# Patient Record
Sex: Male | Born: 1965
Health system: Southern US, Community
[De-identification: ages and names within clinical notes are randomized; demographics above are authoritative.]

## PROBLEM LIST (undated history)

## (undated) ENCOUNTER — Emergency Department (HOSPITAL_COMMUNITY): Discharge status: Absent without leave | Payer: BC Managed Care – PPO

## (undated) DIAGNOSIS — N179 Acute kidney failure, unspecified: Secondary | ICD-10-CM

## (undated) DIAGNOSIS — R0600 Dyspnea, unspecified: Secondary | ICD-10-CM

## (undated) DIAGNOSIS — E119 Type 2 diabetes mellitus without complications: Secondary | ICD-10-CM

## (undated) DIAGNOSIS — E7800 Pure hypercholesterolemia, unspecified: Secondary | ICD-10-CM

## (undated) DIAGNOSIS — R519 Headache, unspecified: Secondary | ICD-10-CM

## (undated) DIAGNOSIS — N189 Chronic kidney disease, unspecified: Secondary | ICD-10-CM

## (undated) DIAGNOSIS — I10 Essential (primary) hypertension: Secondary | ICD-10-CM

## (undated) DIAGNOSIS — G510 Bell's palsy: Secondary | ICD-10-CM

## (undated) DIAGNOSIS — L0291 Cutaneous abscess, unspecified: Secondary | ICD-10-CM

## (undated) DIAGNOSIS — M25512 Pain in left shoulder: Secondary | ICD-10-CM

## (undated) DIAGNOSIS — N186 End stage renal disease: Secondary | ICD-10-CM

## (undated) DIAGNOSIS — R569 Unspecified convulsions: Secondary | ICD-10-CM

## (undated) DIAGNOSIS — N289 Disorder of kidney and ureter, unspecified: Secondary | ICD-10-CM

## (undated) HISTORY — DX: Pain in left shoulder: M25.512

## (undated) HISTORY — PX: OTHER SURGICAL HISTORY: SHX169

## (undated) HISTORY — DX: Disorder of kidney and ureter, unspecified: N28.9

## (undated) HISTORY — DX: Bell's palsy: G51.0

## (undated) HISTORY — PX: CHOLECYSTECTOMY: SHX55

---

## 2006-05-09 ENCOUNTER — Emergency Department (HOSPITAL_COMMUNITY): Admission: EM | Admit: 2006-05-09 | Discharge: 2006-05-09 | Payer: Self-pay | Admitting: Emergency Medicine

## 2010-07-12 ENCOUNTER — Ambulatory Visit
Admission: RE | Admit: 2010-07-12 | Discharge: 2010-07-12 | Payer: Self-pay | Source: Home / Self Care | Attending: Internal Medicine | Admitting: Internal Medicine

## 2011-11-11 ENCOUNTER — Encounter (HOSPITAL_COMMUNITY): Payer: Self-pay | Admitting: Certified Registered Nurse Anesthetist

## 2011-11-11 ENCOUNTER — Encounter (HOSPITAL_COMMUNITY)
Admission: EM | Disposition: A | Discharge status: Home Health | Payer: Self-pay | Source: Ambulatory Visit | Attending: Family Medicine

## 2011-11-11 ENCOUNTER — Inpatient Hospital Stay (HOSPITAL_COMMUNITY): Payer: BC Managed Care – PPO | Admitting: Certified Registered Nurse Anesthetist

## 2011-11-11 ENCOUNTER — Inpatient Hospital Stay (HOSPITAL_COMMUNITY)
Admission: EM | Admit: 2011-11-11 | Discharge: 2011-11-13 | DRG: 277 | Disposition: A | Discharge status: Home Health | Payer: BC Managed Care – PPO | Source: Ambulatory Visit | Attending: Internal Medicine | Admitting: Internal Medicine

## 2011-11-11 ENCOUNTER — Encounter (HOSPITAL_COMMUNITY): Payer: Self-pay

## 2011-11-11 ENCOUNTER — Emergency Department (HOSPITAL_COMMUNITY): Payer: BC Managed Care – PPO

## 2011-11-11 DIAGNOSIS — L03115 Cellulitis of right lower limb: Secondary | ICD-10-CM

## 2011-11-11 DIAGNOSIS — E1165 Type 2 diabetes mellitus with hyperglycemia: Secondary | ICD-10-CM

## 2011-11-11 DIAGNOSIS — R739 Hyperglycemia, unspecified: Secondary | ICD-10-CM

## 2011-11-11 DIAGNOSIS — L03119 Cellulitis of unspecified part of limb: Secondary | ICD-10-CM

## 2011-11-11 DIAGNOSIS — L02419 Cutaneous abscess of limb, unspecified: Principal | ICD-10-CM | POA: Diagnosis present

## 2011-11-11 DIAGNOSIS — I10 Essential (primary) hypertension: Secondary | ICD-10-CM | POA: Diagnosis present

## 2011-11-11 DIAGNOSIS — E119 Type 2 diabetes mellitus without complications: Secondary | ICD-10-CM

## 2011-11-11 DIAGNOSIS — IMO0001 Reserved for inherently not codable concepts without codable children: Secondary | ICD-10-CM | POA: Diagnosis present

## 2011-11-11 DIAGNOSIS — E782 Mixed hyperlipidemia: Secondary | ICD-10-CM | POA: Diagnosis present

## 2011-11-11 DIAGNOSIS — E1169 Type 2 diabetes mellitus with other specified complication: Secondary | ICD-10-CM | POA: Diagnosis present

## 2011-11-11 HISTORY — DX: Essential (primary) hypertension: I10

## 2011-11-11 HISTORY — DX: Cutaneous abscess, unspecified: L02.91

## 2011-11-11 HISTORY — PX: I & D EXTREMITY: SHX5045

## 2011-11-11 LAB — GLUCOSE, CAPILLARY: Glucose-Capillary: 167 mg/dL — ABNORMAL HIGH (ref 70–99)

## 2011-11-11 LAB — BASIC METABOLIC PANEL
BUN: 14 mg/dL (ref 6–23)
Chloride: 92 mEq/L — ABNORMAL LOW (ref 96–112)
Glucose, Bld: 427 mg/dL — ABNORMAL HIGH (ref 70–99)
Potassium: 4.4 mEq/L (ref 3.5–5.1)

## 2011-11-11 LAB — CBC
MCHC: 35.2 g/dL (ref 30.0–36.0)
RBC: 5.01 MIL/uL (ref 4.22–5.81)
WBC: 11.7 10*3/uL — ABNORMAL HIGH (ref 4.0–10.5)

## 2011-11-11 LAB — DIFFERENTIAL
Basophils Absolute: 0 10*3/uL (ref 0.0–0.1)
Basophils Relative: 0 % (ref 0–1)
Eosinophils Absolute: 0.1 10*3/uL (ref 0.0–0.7)
Eosinophils Relative: 1 % (ref 0–5)
Neutro Abs: 8.4 10*3/uL — ABNORMAL HIGH (ref 1.7–7.7)

## 2011-11-11 LAB — POCT I-STAT 4, (NA,K, GLUC, HGB,HCT): Glucose, Bld: 185 mg/dL — ABNORMAL HIGH (ref 70–99)

## 2011-11-11 SURGERY — IRRIGATION AND DEBRIDEMENT EXTREMITY
Anesthesia: General | Site: Perineum | Laterality: Right | Wound class: Contaminated

## 2011-11-11 MED ORDER — FENTANYL CITRATE 0.05 MG/ML IJ SOLN
INTRAMUSCULAR | Status: DC | PRN
  Administered 2011-11-11: 100 ug via INTRAVENOUS
  Administered 2011-11-11: 150 ug via INTRAVENOUS

## 2011-11-11 MED ORDER — MORPHINE SULFATE 2 MG/ML IJ SOLN: 0.0500 mg/kg | INTRAMUSCULAR | Status: DC | PRN

## 2011-11-11 MED ORDER — ONDANSETRON HCL 4 MG/2ML IJ SOLN: 4.0000 mg | Freq: Three times a day (TID) | INTRAMUSCULAR | Status: DC | PRN

## 2011-11-11 MED ORDER — SODIUM CHLORIDE 0.9 % IV SOLN
INTRAVENOUS | Status: DC | PRN
  Administered 2011-11-11: 20:00:00 via INTRAVENOUS

## 2011-11-11 MED ORDER — DIPHENHYDRAMINE HCL 12.5 MG/5ML PO ELIX: 12.5000 mg | ORAL_SOLUTION | Freq: Four times a day (QID) | ORAL | Status: DC | PRN

## 2011-11-11 MED ORDER — PROPOFOL 10 MG/ML IV EMUL
INTRAVENOUS | Status: DC | PRN
  Administered 2011-11-11: 200 mg via INTRAVENOUS

## 2011-11-11 MED ORDER — GLYCOPYRROLATE 0.2 MG/ML IJ SOLN
INTRAMUSCULAR | Status: DC | PRN
  Administered 2011-11-11: .5 mg via INTRAVENOUS

## 2011-11-11 MED ORDER — SODIUM CHLORIDE 0.9 % IJ SOLN: 9.0000 mL | INTRAMUSCULAR | Status: DC | PRN

## 2011-11-11 MED ORDER — ONDANSETRON HCL 4 MG/2ML IJ SOLN
INTRAMUSCULAR | Status: DC | PRN
  Administered 2011-11-11: 4 mg via INTRAVENOUS

## 2011-11-11 MED ORDER — PIPERACILLIN-TAZOBACTAM 3.375 G IVPB
3.3750 g | Freq: Once | INTRAVENOUS | Status: AC
  Administered 2011-11-11: 3.375 g via INTRAVENOUS
  Filled 2011-11-11: qty 50

## 2011-11-11 MED ORDER — IOHEXOL 300 MG/ML  SOLN
100.0000 mL | Freq: Once | INTRAMUSCULAR | Status: AC | PRN
  Administered 2011-11-11: 100 mL via INTRAVENOUS

## 2011-11-11 MED ORDER — NALOXONE HCL 0.4 MG/ML IJ SOLN: 0.4000 mg | INTRAMUSCULAR | Status: DC | PRN

## 2011-11-11 MED ORDER — VANCOMYCIN HCL IN DEXTROSE 1-5 GM/200ML-% IV SOLN
1000.0000 mg | Freq: Once | INTRAVENOUS | Status: AC
  Administered 2011-11-11: 1000 mg via INTRAVENOUS
  Filled 2011-11-11: qty 200

## 2011-11-11 MED ORDER — HYDROMORPHONE HCL PF 1 MG/ML IJ SOLN
0.2500 mg | INTRAMUSCULAR | Status: DC | PRN
  Administered 2011-11-11 (×4): 0.5 mg via INTRAVENOUS

## 2011-11-11 MED ORDER — MIDAZOLAM HCL 5 MG/5ML IJ SOLN
INTRAMUSCULAR | Status: DC | PRN
  Administered 2011-11-11: 2 mg via INTRAVENOUS

## 2011-11-11 MED ORDER — SODIUM CHLORIDE 0.9 % IV SOLN
Freq: Once | INTRAVENOUS | Status: AC
  Administered 2011-11-11: 11:00:00 via INTRAVENOUS

## 2011-11-11 MED ORDER — HYDROMORPHONE HCL PF 1 MG/ML IJ SOLN
INTRAMUSCULAR | Status: AC
  Filled 2011-11-11: qty 1

## 2011-11-11 MED ORDER — ROCURONIUM BROMIDE 100 MG/10ML IV SOLN
INTRAVENOUS | Status: DC | PRN
  Administered 2011-11-11: 30 mg via INTRAVENOUS

## 2011-11-11 MED ORDER — SODIUM CHLORIDE 0.9 % IV SOLN: INTRAVENOUS | Status: DC

## 2011-11-11 MED ORDER — SUCCINYLCHOLINE CHLORIDE 20 MG/ML IJ SOLN
INTRAMUSCULAR | Status: DC | PRN
  Administered 2011-11-11: 140 mg via INTRAVENOUS

## 2011-11-11 MED ORDER — HYDROMORPHONE HCL PF 1 MG/ML IJ SOLN: 1.0000 mg | INTRAMUSCULAR | Status: DC | PRN

## 2011-11-11 MED ORDER — ONDANSETRON HCL 4 MG/2ML IJ SOLN: 4.0000 mg | Freq: Once | INTRAMUSCULAR | Status: DC | PRN

## 2011-11-11 MED ORDER — DIPHENHYDRAMINE HCL 50 MG/ML IJ SOLN: 12.5000 mg | Freq: Four times a day (QID) | INTRAMUSCULAR | Status: DC | PRN

## 2011-11-11 MED ORDER — LACTATED RINGERS IV SOLN
INTRAVENOUS | Status: DC | PRN
  Administered 2011-11-11: 19:00:00 via INTRAVENOUS

## 2011-11-11 MED ORDER — MORPHINE SULFATE (PF) 1 MG/ML IV SOLN: INTRAVENOUS | Status: DC

## 2011-11-11 MED ORDER — ONDANSETRON HCL 4 MG/2ML IJ SOLN: 4.0000 mg | Freq: Four times a day (QID) | INTRAMUSCULAR | Status: DC | PRN

## 2011-11-11 MED ORDER — NEOSTIGMINE METHYLSULFATE 1 MG/ML IJ SOLN
INTRAMUSCULAR | Status: DC | PRN
  Administered 2011-11-11: 4 mg via INTRAVENOUS

## 2011-11-11 SURGICAL SUPPLY — 32 items
BANDAGE GAUZE ELAST BULKY 4 IN (GAUZE/BANDAGES/DRESSINGS) ×2 IMPLANT
CANISTER SUCTION 2500CC (MISCELLANEOUS) ×2 IMPLANT
CHLORAPREP W/TINT 26ML (MISCELLANEOUS) IMPLANT
CLOTH BEACON ORANGE TIMEOUT ST (SAFETY) ×2 IMPLANT
COVER SURGICAL LIGHT HANDLE (MISCELLANEOUS) ×2 IMPLANT
DRAPE EXTREMITY T 121X128X90 (DRAPE) ×2 IMPLANT
DRAPE PROXIMA HALF (DRAPES) ×4 IMPLANT
DRAPE UTILITY 15X26 W/TAPE STR (DRAPE) ×8 IMPLANT
DRSG PAD ABDOMINAL 8X10 ST (GAUZE/BANDAGES/DRESSINGS) ×2 IMPLANT
ELECT REM PT RETURN 9FT ADLT (ELECTROSURGICAL) ×2
ELECTRODE REM PT RTRN 9FT ADLT (ELECTROSURGICAL) ×1 IMPLANT
GLOVE BIOGEL PI IND STRL 8 (GLOVE) ×1 IMPLANT
GLOVE BIOGEL PI INDICATOR 8 (GLOVE) ×1
GLOVE ECLIPSE 8.0 STRL XLNG CF (GLOVE) ×2 IMPLANT
GOWN STRL NON-REIN LRG LVL3 (GOWN DISPOSABLE) ×4 IMPLANT
HANDPIECE INTERPULSE COAX TIP (DISPOSABLE)
KIT BASIN OR (CUSTOM PROCEDURE TRAY) ×2 IMPLANT
KIT ROOM TURNOVER OR (KITS) ×2 IMPLANT
NS IRRIG 1000ML POUR BTL (IV SOLUTION) ×2 IMPLANT
PACK GENERAL/GYN (CUSTOM PROCEDURE TRAY) ×2 IMPLANT
PAD ARMBOARD 7.5X6 YLW CONV (MISCELLANEOUS) ×4 IMPLANT
SET HNDPC FAN SPRY TIP SCT (DISPOSABLE) IMPLANT
SPECIMEN JAR SMALL (MISCELLANEOUS) ×2 IMPLANT
SPONGE GAUZE 4X4 12PLY (GAUZE/BANDAGES/DRESSINGS) ×2 IMPLANT
STOCKINETTE 6  STRL (DRAPES) ×1
STOCKINETTE 6 STRL (DRAPES) ×1 IMPLANT
SUT VIC AB 3-0 SH 18 (SUTURE) IMPLANT
TOWEL OR 17X24 6PK STRL BLUE (TOWEL DISPOSABLE) ×2 IMPLANT
TOWEL OR 17X26 10 PK STRL BLUE (TOWEL DISPOSABLE) ×2 IMPLANT
TUBE ANAEROBIC SPECIMEN COL (MISCELLANEOUS) IMPLANT
UNDERPAD 30X30 INCONTINENT (UNDERPADS AND DIAPERS) ×2 IMPLANT
WATER STERILE IRR 1000ML POUR (IV SOLUTION) IMPLANT

## 2011-11-11 NOTE — ED Provider Notes (Signed)
History     CSN: [id number]  Arrival date & time 11/11/11  [id number]   First MD Initiated Contact with Patient 11/11/11 [id number]      Chief Complaint  Patient presents with  . Abscess    (Consider location/radiation/quality/duration/timing/severity/associated sxs/prior treatment) HPI History from patient. 46 year old diabetic male who presents with abscess to his right posterior upper thigh/buttock; presents today 2/2 worsening pain. States this first came up approximately a week ago. It has been gradually worsening over the past several days. States the area ruptured and has had copious drainage, but he still has considerable pain from the area. Pain worsens with sitting. He denies fever or chills although notes he has had some sweating. Denies scrotal or penile lesions or swelling. States it is difficult to have a bowel movement due to the pain. He apparently has had these in the past and they have been incised and drained in the emergency department.  Patient states that he was formerly on medication for his diabetes, but has not seen a physician in about 5 years for this. He is not sure what his daily blood sugars have been running. He denies increased frequency of urination or thirst.  Past Medical History  Diagnosis Date  . Abscess   . Hypertension   . Diabetes mellitus     History reviewed. No pertinent past surgical history.  No family history on file.  History  Substance Use Topics  . Smoking status: Never Smoker   . Smokeless tobacco: Not on file  . Alcohol Use: No      Review of Systems  Constitutional: Negative for fever, chills, activity change and appetite change.  Gastrointestinal: Negative for nausea, vomiting, abdominal pain, diarrhea, constipation, blood in stool and anal bleeding.  Genitourinary: Negative for flank pain, discharge, scrotal swelling, difficulty urinating, penile pain and testicular pain.  Skin: Positive for wound. Negative for color change.     Allergies  Review of patient's allergies indicates no known allergies.  Home Medications  No current outpatient prescriptions on file.  BP 167/109  Pulse 113  Temp(Src) 98.2 F (36.8 C) (Oral)  Resp 16  SpO2 100%  Physical Exam  Nursing note and vitals reviewed. Constitutional: He appears well-developed and well-nourished. No distress.       Afebrile  HENT:  Head: Normocephalic and atraumatic.  Neck: Normal range of motion.  Cardiovascular: Regular rhythm and normal heart sounds.        Tachycardic  Pulmonary/Chest: Effort normal and breath sounds normal. He exhibits no tenderness.  Abdominal: Soft. Bowel sounds are normal. There is no tenderness. There is no rebound and no guarding.  Genitourinary:       Wound measuring approximately 5 x 3 cm to right buttock/upper thigh area. Surrounding extensive induration. No overlying cellulitis appreciated. There is no involvement at the midline. No scrotal or penile swelling or discharge. Area is extremely tender to palpation.  Neurological: He is alert.  Skin: He is not diaphoretic.    ED Course  Procedures (including critical care time)  Labs Reviewed  GLUCOSE, CAPILLARY - Abnormal; Notable for the following:    Glucose-Capillary 397 (*)    All other components within normal limits  CBC - Abnormal; Notable for the following:    WBC 11.7 (*)    HCT 37.5 (*)    MCV 74.9 (*)    All other components within normal limits  DIFFERENTIAL - Abnormal; Notable for the following:    Monocytes Relative 15 (*)  Neutro Abs 8.4 (*)    Monocytes Absolute 1.8 (*)    All other components within normal limits  BASIC METABOLIC PANEL - Abnormal; Notable for the following:    Sodium 131 (*)    Chloride 92 (*)    Glucose, Bld 427 (*)    GFR calc non Af Amer 89 (*)    All other components within normal limits  URINALYSIS, ROUTINE W REFLEX MICROSCOPIC  CULTURE, BLOOD (ROUTINE X 2)  CULTURE, BLOOD (ROUTINE X 2)   Ct Pelvis W  Contrast  11/11/2011  *RADIOLOGY REPORT*  Clinical Data:  Peritesticular abscesses.  CT PELVIS WITH CONTRAST  Technique:  Multidetector CT imaging of the pelvis was performed using the standard protocol following the bolus administration of intravenous contrast.  Contrast: 13mL OMNIPAQUE IOHEXOL 300 MG/ML  SOLN  Comparison:  None.  Findings:  Small periumbilical hernia contains fat.  Visualized portion of the bowel is unremarkable.  No pathologically enlarged lymph nodes.  No free fluid.  Scattered atherosclerotic calcification of the arterial vasculature.  There is skin thickening and edema involving the medial aspect of the proximal right thigh.  Locules of gas are seen within the skin and just deep to the skin surface.  No organized fluid collection.  Incidental note is made of a lipoma in the anterior right thigh musculature.  No worrisome lytic or sclerotic lesions.  IMPRESSION: Extensive cellulitis involving the medial aspect of the proximal right thigh, with locules of gas, indicating either ulceration or the presence of a gas-forming organism.  No organized fluid collection. These results were called by telephone on 11/11/2011 at  1225 hours to  Abran Richard, who verbally acknowledged these results.  Original Report Authenticated By: Luretha Rued, M.D.     1. Cellulitis of right thigh   2. Hyperglycemia   3. Diabetes mellitus       MDM  Diabetic male presents with cellulitis and questionable abscess to the posterior proximal right thigh. He is nontoxic-appearing and afebrile. He is noted to have a ulceration to the area in question, which is off the midline and does not appear consistent with Fournier gangrene as there is no perineal, penile, or scrotal involvement.   Labs were remarkable for a mild white count of 11,000 and elevated glucose to 427 on BMP; he did not have an anion gap which would be concerning for DKA.Marland Kitchen We proceeded with a CT of the pelvis, which I personally  reviewed and d/w Dr. Rosario Jacks of rads - it indicated cellulitis with locules of gas which was thought to be gas-forming organism versus ulceration. It does appear as if there may be an ulceration to the area, clinically.  I discussed this with Dr. Eulis Foster, who saw the patient with me. We will plan to have the patient admitted to medicine. Pt has been given dose of IV vanc and zosyn, currently being rehydrated to help BG.  1:11 PM D/W Dr. Silverio Decamp with teaching service (unassigned). Will come see and admit pt. Requests surgery consult.  2:33 PM Dr. Silverio Decamp states that the pt has seen Dr. Jeanie Cooks in the past so believes he is still the pt's active PCP, so he should be admitted to [facility]. I've discussed with Dr. Sarajane Jews who agrees to admit the pt to [facility] team 2 for further eval and tx. Surgery to consult.    Abran Richard, [place] 11/11/11 [address] [place] 11/11/11 [place], [place] 11/11/11 1521

## 2011-11-11 NOTE — ED Notes (Signed)
Spoke with OR stated will send for patient within one hour.

## 2011-11-11 NOTE — ED Notes (Addendum)
Called OR stated waiting for Surgeon then will send for patient. States was within one hour should be very soon.  Patient and family member verbalized understanding.

## 2011-11-11 NOTE — ED Notes (Signed)
Pt. Reports having numerous abscesses around his testicles.    Pt. Reports having an odor from them

## 2011-11-11 NOTE — ED Notes (Signed)
Patient resting comfortably on stretcher. Family member at beside.

## 2011-11-11 NOTE — Consult Note (Signed)
Pt requires emergent I&D, especially with significant skin necrosis.  The anatomy and physiology of skin abscesses was discussed. Pathophysiology of SQ abscess, possible fasciitis. I stressed good hygiene & wound care.  Possible redebridement was discussed as well.  Possibility of recurrence was discussed. Risks, benefits, alternatives were discussed. I noted a good likelihood this will help address the problem.   Risks of anesthesia and other risks discussed. Questions answered.  The patient is considering surgery. They wish to proceed.

## 2011-11-11 NOTE — H&P (Signed)
History and Physical  Jeremy [name] [id number] DOB: 1965-08-08 DOA: 11/11/2011  Referring physician: Dr. Eulis Foster, MD PCP: None.  Chief Complaint: Pain in thigh  HPI:  46 year old man with a history of uncontrolled diabetes presented to the emergency department with a complaint of one-2 week pain and drainage from his right thigh. It has become progressively more painful and he is without relief. In the emergency department he was noted to have obvious infection the CAT scan was performed which showed gas in the soft tissue. Patient seen by general surgery and surgery is planned for later this evening. He is in his uncontrolled diabetes he was referred to medicine for admission.  The patient has no primary care physician and has not seen his previous physician in at least 30 years.  Review of Systems:  Negative for fever, changes to his vision, stretcher, rash, muscle aches, chest pain, shortness of breath, dysuria, bleeding, nausea, vomiting, abdominal pain  Past Medical History  Diagnosis Date  . Abscess   . Hypertension   . Diabetes mellitus    Past Surgical History  Procedure Date  . Incision and drainage of wound    Social History:  reports that he has never smoked. He does not have any smokeless tobacco history on file. He reports that he does not drink alcohol. His drug history not on file.  No Known Allergies  Family History  Problem Relation Age of Onset  . Diabetes Mother    Prior to Admission medications   Not on File   Physical Exam: Filed Vitals:   11/11/11 0826  BP: 167/109  Pulse: 113  Temp: 98.2 F (36.8 C)  TempSrc: Oral  Resp: 16  SpO2: 100%    General:  Calm and comfortable. Nontoxic.  Eyes: Pupils equal, round, record to light. Normal lids, irises, conjunctiva appear  ENT: Grossly normal hearing. Lips and tongue panel remarkable  Neck: Without adenopathy or masses. No thyromegaly.  Respiratory: Clear to auscultation bilaterally. No wheezes,  rales, rhonchi. Normal respiratory effort.  Cardiovascular: Regular rate and rhythm. No murmur, rub, gallop. No lower extremity edema.  Abdomen: Soft, nontender, nondistended. Obese  Skin: Right thigh is necrotic and ulcerated tissue with massive induration. Exquisitely tender to palpation. Penis, testes and scrotum appear unremarkable with no evidence of cellulitis and nontender to palpation. Perineum appears unremarkable.  Musculoskeletal: Appears grossly normal.  Psychiatric: Grossly normal mood and affect. Speech fluent and appropriate.  Labs on Admission:  Basic Metabolic Panel:  Lab 0000000 0947  NA 131*  K 4.4  CL 92*  CO2 29  GLUCOSE 427*  BUN 14  CREATININE 1.01  CALCIUM 9.8  MG --  PHOS --   CBC:  Lab 11/11/11 0947  WBC 11.7*  NEUTROABS 8.4*  HGB 13.2  HCT 37.5*  MCV 74.9*  PLT 349   CBG:  Lab 11/11/11 0916  GLUCAP 397*   Radiological Exams on Admission: Ct Pelvis W Contrast  11/11/2011  *RADIOLOGY REPORT*  Clinical Data:  Peritesticular abscesses.  CT PELVIS WITH CONTRAST  Technique:  Multidetector CT imaging of the pelvis was performed using the standard protocol following the bolus administration of intravenous contrast.  Contrast: 12mL OMNIPAQUE IOHEXOL 300 MG/ML  SOLN  Comparison:  None.  Findings:  Small periumbilical hernia contains fat.  Visualized portion of the bowel is unremarkable.  No pathologically enlarged lymph nodes.  No free fluid.  Scattered atherosclerotic calcification of the arterial vasculature.  There is skin thickening and edema involving the medial aspect of  the proximal right thigh.  Locules of gas are seen within the skin and just deep to the skin surface.  No organized fluid collection.  Incidental note is made of a lipoma in the anterior right thigh musculature.  No worrisome lytic or sclerotic lesions.  IMPRESSION: Extensive cellulitis involving the medial aspect of the proximal right thigh, with locules of gas, indicating either  ulceration or the presence of a gas-forming organism.  No organized fluid collection. These results were called by telephone on 11/11/2011 at  1225 hours to  Abran Richard, who verbally acknowledged these results.  Original Report Authenticated By: Luretha Rued, M.D.   Assessment/Plan 1. Right thigh abscess, possible Fournier's gangrene: IV Zosyn and vancomycin. Surgical consultation obtained and surgery is planned. Patient appears hemodynamically stable. 2. Diabetes mellitus type 2, uncontrolled: The patient been noncompliant and not on medications for some time. Start insulin. Diabetic teaching. Also be a good candidate for metformin as an outpatient though I doubt it would be sufficient to control his blood sugars.  Code Status: Full code Family Communication: None bedside Disposition Plan: Pending further evaluation and treatment.  Murray Hodgkins, MD  [facility] Pager [phone]  If 8PM-8AM, please contact floor/night-coverage [email or web address] Password [facility] 11/11/2011, 2:23 PM

## 2011-11-11 NOTE — ED Notes (Signed)
Admit Doctor at bedside.  

## 2011-11-11 NOTE — ED Notes (Signed)
Patient states onset 2 weeks ago right posterior thigh abscess felt pain then resolved returned recently unknown time and pain at rest 0/10 with palpation to movement 5-10/10 sharp.  Irregular shape 4cmx2cm yellow scant draiange black center 1cmx1cm irregular shape. Skin hard radiating toward knee 5cm x3cm.

## 2011-11-11 NOTE — ED Provider Notes (Signed)
   Patient complains several days of right upper leg. Swelling and drainage. He denies problems urinating. He has had no fever. He is not taking medications for his diabetes. On exam he has a large abscess of the right upper medial thigh. It does not extend to the perineum clinically. Scrotum and penis appear normal.  Medical screening examination/treatment/procedure(s) were conducted as a shared visit with non-physician practitioner(s) and myself.  I personally evaluated the patient during the encounter  Richarda Blade, MD 11/12/11 [phone]

## 2011-11-11 NOTE — Transfer of Care (Signed)
Immediate Anesthesia Transfer of Care Note  Patient: Jeremy [name]  Procedure(s) Performed: Procedure(s) (LRB): IRRIGATION AND DEBRIDEMENT EXTREMITY (Right)  Patient Location: PACU  Anesthesia Type: General  Level of Consciousness: awake, alert  and oriented  Airway & Oxygen Therapy: Patient Spontanous Breathing and Patient connected to nasal cannula oxygen  Post-op Assessment: Report given to PACU RN and Post -op Vital signs reviewed and stable  Post vital signs: Reviewed and stable  Complications: No apparent anesthesia complications

## 2011-11-11 NOTE — Preoperative (Signed)
Beta Blockers   Reason not to administer Beta Blockers:Not Applicable 

## 2011-11-11 NOTE — Anesthesia Preprocedure Evaluation (Addendum)
Anesthesia Evaluation  Patient identified by MRN, date of birth, ID band Patient awake    Reviewed: Allergy & Precautions, H&P , NPO status , Patient's Chart, lab work & pertinent test results  Airway Mallampati: I TM Distance: >3 FB Neck ROM: Full    Dental  (+) Teeth Intact, Dental Advisory Given and Chipped   Pulmonary neg pulmonary ROS,  breath sounds clear to auscultation        Cardiovascular hypertension, Rhythm:Regular Rate:Normal     Neuro/Psych negative neurological ROS  negative psych ROS   GI/Hepatic negative GI ROS, Neg liver ROS,   Endo/Other  Diabetes mellitus-, Poorly Controlled, Type 2Morbid obesity  Renal/GU negative Renal ROS  negative genitourinary   Musculoskeletal  (+) Arthritis -, Osteoarthritis,    Abdominal   Peds  Hematology negative hematology ROS (+)   Anesthesia Other Findings   Reproductive/Obstetrics negative OB ROS                        Anesthesia Physical Anesthesia Plan  ASA: III  Anesthesia Plan: General   Post-op Pain Management:    Induction: Intravenous  Airway Management Planned: Oral ETT  Additional Equipment:   Intra-op Plan:   Post-operative Plan: Extubation in OR  Informed Consent: I have reviewed the patients History and Physical, chart, labs and discussed the procedure including the risks, benefits and alternatives for the proposed anesthesia with the patient or authorized representative who has indicated his/her understanding and acceptance.   Dental advisory given  Plan Discussed with: CRNA, Anesthesiologist and Surgeon  Anesthesia Plan Comments:         Anesthesia Quick Evaluation

## 2011-11-11 NOTE — ED Notes (Signed)
Patient refused to allow this RN to look at the area due to the odor.  He is being moved to CDU to have a more private controlled environment for his assessment and treatment.  Patient is a diabetic,  He has not taken any meds for at least a year.  Patient cbg is 397.  erpa is aware.

## 2011-11-11 NOTE — ED Notes (Signed)
[id number] Ready

## 2011-11-11 NOTE — ED Notes (Signed)
Patient brought to CDU and undressing given gown and blanket.  Girlfriend at bedside.

## 2011-11-11 NOTE — Anesthesia Postprocedure Evaluation (Signed)
  Anesthesia Post-op Note  Patient: Jeremy [name]  Procedure(s) Performed: Procedure(s) (LRB): IRRIGATION AND DEBRIDEMENT EXTREMITY (Right)  Patient Location: PACU  Anesthesia Type: General  Level of Consciousness: awake, alert  and oriented  Airway and Oxygen Therapy: Patient Spontanous Breathing and Patient connected to nasal cannula oxygen  Post-op Pain: mild  Post-op Assessment: Post-op Vital signs reviewed  Post-op Vital Signs: Reviewed  Complications: No apparent anesthesia complications

## 2011-11-11 NOTE — ED Notes (Signed)
OR called ready for patient

## 2011-11-11 NOTE — Consult Note (Signed)
Jeremy [name] Mar 08, 1966  [id number].   Primary Care MD: None Requesting MD: Dr. Eulis Foster Chief Complaint/Reason for Consult: right thigh abscess HPI: This is a 46 yo male with uncontrolled DM who about 2 weeks ago noticed some soreness on his inner right thigh after sitting on concrete at a friend's house.  It progressed.  Last Tuesday, he noticed a boil like lesion that had a head on it.  The wife states that it spontaneously drained, but as it drained the area of skin involved continued to enlarge.  He had sweats, but did not ever check a fever at home.  He sat in warm water and peroxide, which did not help.  Finally today, secondary to worsening pain, he presented to [place] where he had a CT scan that revealed gaseous formation in his soft tissue with extensive cellulitic changes.  Medicine was asked to admit and we were asked to evaluate for other recommendations.  Review of Systems: Please see HPI, otherwise all other systems have been reviewed and are negative.  History reviewed. No pertinent family history.  Past Medical History  Diagnosis Date  . Abscess   . Hypertension   . Diabetes mellitus     Past Surgical History  Procedure Date  . Incision and drainage of wound     Social History:  reports that he has never smoked. He does not have any smokeless tobacco history on file. He reports that he does not drink alcohol. His drug history not on file.  Allergies: No Known Allergies   (Not in a hospital admission)  Blood pressure 155/97, pulse 90, temperature 98.2 F (36.8 C), temperature source Oral, resp. rate 20, SpO2 97.00%. Physical Exam: General: pleasant, obese black male who is laying in bed in NAD HEENT: head is normocephalic, atraumatic.  Sclera are noninjected.  PERRL.  Ears and nose without any masses or lesions.  Mouth is pink and moist Heart: regular, rate, and rhythm.  Normal s1,s2. No obvious murmurs, gallops, or rubs noted.  Palpable radial and pedal  pulses bilaterally Lungs: CTAB, no wheezes, rhonchi, or rales noted.  Respiratory effort nonlabored Abd: soft, NT, ND, +BS, no masses, hernias, or organomegaly MS: all 4 extremities are symmetrical with no cyanosis, clubbing, slight LE edema Skin: warm and dry. Right thigh with a 7x12 cm area of skin necrosis with brown, foul smelling brackish discharge.  This area is very tender to palpation.  Some induration present that is about 15x15cm.  No penile or scrotal discomfort or edema. Psych: A&Ox3 with an appropriate affect.    Results for orders placed during the hospital encounter of 11/11/11 (from the past 48 hour(s))  GLUCOSE, CAPILLARY     Status: Abnormal   Collection Time   11/11/11  9:16 AM      Component Value Range Comment   Glucose-Capillary 397 (*) 70 - 99 (mg/dL)    Comment 1 Documented in Chart      Comment 2 Notify RN      Comment 3 Call MD NNP PA CNM     CBC     Status: Abnormal   Collection Time   11/11/11  9:47 AM      Component Value Range Comment   WBC 11.7 (*) 4.0 - 10.5 (K/uL)    RBC 5.01  4.22 - 5.81 (MIL/uL)    Hemoglobin 13.2  13.0 - 17.0 (g/dL)    HCT 37.5 (*) 39.0 - 52.0 (%)    MCV 74.9 (*) 78.0 - 100.0 (fL)  MCH 26.3  26.0 - 34.0 (pg)    MCHC 35.2  30.0 - 36.0 (g/dL)    RDW 12.7  11.5 - 15.5 (%)    Platelets 349  150 - 400 (K/uL)   DIFFERENTIAL     Status: Abnormal   Collection Time   11/11/11  9:47 AM      Component Value Range Comment   Neutrophils Relative 72  43 - 77 (%)    Lymphocytes Relative 12  12 - 46 (%)    Monocytes Relative 15 (*) 3 - 12 (%)    Eosinophils Relative 1  0 - 5 (%)    Basophils Relative 0  0 - 1 (%)    Neutro Abs 8.4 (*) 1.7 - 7.7 (K/uL)    Lymphs Abs 1.4  0.7 - 4.0 (K/uL)    Monocytes Absolute 1.8 (*) 0.1 - 1.0 (K/uL)    Eosinophils Absolute 0.1  0.0 - 0.7 (K/uL)    Basophils Absolute 0.0  0.0 - 0.1 (K/uL)   BASIC METABOLIC PANEL     Status: Abnormal   Collection Time   11/11/11  9:47 AM      Component Value Range  Comment   Sodium 131 (*) 135 - 145 (mEq/L)    Potassium 4.4  3.5 - 5.1 (mEq/L)    Chloride 92 (*) 96 - 112 (mEq/L)    CO2 29  19 - 32 (mEq/L)    Glucose, Bld 427 (*) 70 - 99 (mg/dL)    BUN 14  6 - 23 (mg/dL)    Creatinine, Ser 1.01  0.50 - 1.35 (mg/dL)    Calcium 9.8  8.4 - 10.5 (mg/dL)    GFR calc non Af Amer 89 (*) >90 (mL/min)    GFR calc Af Amer >90  >90 (mL/min)    Ct Pelvis W Contrast  11/11/2011  *RADIOLOGY REPORT*  Clinical Data:  Peritesticular abscesses.  CT PELVIS WITH CONTRAST  Technique:  Multidetector CT imaging of the pelvis was performed using the standard protocol following the bolus administration of intravenous contrast.  Contrast: 158mL OMNIPAQUE IOHEXOL 300 MG/ML  SOLN  Comparison:  None.  Findings:  Small periumbilical hernia contains fat.  Visualized portion of the bowel is unremarkable.  No pathologically enlarged lymph nodes.  No free fluid.  Scattered atherosclerotic calcification of the arterial vasculature.  There is skin thickening and edema involving the medial aspect of the proximal right thigh.  Locules of gas are seen within the skin and just deep to the skin surface.  No organized fluid collection.  Incidental note is made of a lipoma in the anterior right thigh musculature.  No worrisome lytic or sclerotic lesions.  IMPRESSION: Extensive cellulitis involving the medial aspect of the proximal right thigh, with locules of gas, indicating either ulceration or the presence of a gas-forming organism.  No organized fluid collection. These results were called by telephone on 11/11/2011 at  1225 hours to  Abran Richard, who verbally acknowledged these results.  Original Report Authenticated By: Luretha Rued, M.D.       Assessment/Plan 1. Right thigh abscess, possible Fournier's gangrene 2. Uncontrolled DM 3. HTN  Plan: 1. Given the clinical appearance of this area, the patient would benefit from a surgical incision and debridement of this area.  He has  extensive skin necrosis.  The concern would be that if he did not get this debrided, the infection could spread and potentially worsen and make the patient more sick.  We agree with  Vanc and Zosyn.  Agree with medicine admission for DM control.  The patient may have delayed wound healing postoperatively given his diabetic status.  We have discussed this with the patient and his wife along with possible complications including, but not limited to bleeding and infection.  We did discuss the procedure along with postoperative wound care.  They both understand and wish to proceed with surgical intervention.  Jadi Deyarmin E 11/11/2011, 2:59 PM

## 2011-11-11 NOTE — Anesthesia Procedure Notes (Signed)
Procedure Name: Intubation Date/Time: 11/11/2011 8:18 PM Performed by: Grayer Sproles S Pre-anesthesia Checklist: Patient identified, Emergency Drugs available, Suction available, Patient being monitored and Timeout performed Patient Re-evaluated:Patient Re-evaluated prior to inductionOxygen Delivery Method: Circle system utilized Preoxygenation: Pre-oxygenation with 100% oxygen Intubation Type: IV induction Ventilation: Mask ventilation without difficulty Laryngoscope Size: Mac and 4 Grade View: Grade I Tube type: Oral Number of attempts: 1 Airway Equipment and Method: Stylet Placement Confirmation: ETT inserted through vocal cords under direct vision,  positive ETCO2 and breath sounds checked- equal and bilateral Secured at: 22 cm Tube secured with: Tape Dental Injury: Teeth and Oropharynx as per pre-operative assessment

## 2011-11-11 NOTE — Op Note (Signed)
11/11/2011  8:52 PM  PATIENT:  Jeremy [name]  46 y.o. male  PRE-OPERATIVE DIAGNOSIS:  Infected Right Thigh  POST-OPERATIVE DIAGNOSIS:  Abscess right thigh  PROCEDURE:  Procedure(s) (LRB): IRRIGATION AND DEBRIDEMENT EXTREMITY (Right)  SURGEON:  Surgeon(s) and Role:    * Merrie Roof, MD - Primary  PHYSICIAN ASSISTANT:   ASSISTANTS: none   ANESTHESIA:   general  EBL:  Total I/O In: 1400 [I.V.:1400] Out: -   BLOOD ADMINISTERED:none  DRAINS: none   LOCAL MEDICATIONS USED:  NONE  SPECIMEN:  No Specimen  DISPOSITION OF SPECIMEN:  N/A  COUNTS:  YES  TOURNIQUET:  * No tourniquets in log *  DICTATION: .Dragon Dictation After informed consent was obtained the patient was brought to the operating room and placed in the supine position on the operating room table. After adequate induction general anesthesia the patient's perirectal perineal and thigh areas were prepped with Betadine and draped in the usual sterile manner. On the right posterior thigh area the patient had an area of skin necrosis with abscess. The dead tissue was debrided sharply with the electrocautery until only viable tissue appeared to remain. Cultures were obtained. Hemostasis was achieved using the Bovie electrocautery. Once the wound appeared to be clean and no further tissue would open with probing then the wound was packed with moistened Kerlix gauze. Sterile dressings were applied. The patient tolerated the procedure well. At the end of the case all needle sponge and instrument counts were correct. The patient was awakened and taken to recovery in stable condition.  PLAN OF CARE: Admit to inpatient   PATIENT DISPOSITION:  PACU - hemodynamically stable.   Delay start of Pharmacological VTE agent (>24hrs) due to surgical blood loss or risk of bleeding: yes

## 2011-11-12 ENCOUNTER — Encounter (HOSPITAL_COMMUNITY): Payer: Self-pay | Admitting: *Deleted

## 2011-11-12 LAB — GLUCOSE, CAPILLARY

## 2011-11-12 LAB — CBC
HCT: 34.4 % — ABNORMAL LOW (ref 39.0–52.0)
MCH: 26.4 pg (ref 26.0–34.0)
MCV: 75.1 fL — ABNORMAL LOW (ref 78.0–100.0)
Platelets: 325 10*3/uL (ref 150–400)
RDW: 12.9 % (ref 11.5–15.5)

## 2011-11-12 LAB — BASIC METABOLIC PANEL
CO2: 27 mEq/L (ref 19–32)
Calcium: 8.9 mg/dL (ref 8.4–10.5)
Chloride: 99 mEq/L (ref 96–112)
Creatinine, Ser: 0.95 mg/dL (ref 0.50–1.35)
Glucose, Bld: 315 mg/dL — ABNORMAL HIGH (ref 70–99)

## 2011-11-12 MED ORDER — INSULIN GLARGINE 100 UNIT/ML ~~LOC~~ SOLN
5.0000 [IU] | Freq: Every day | SUBCUTANEOUS | Status: DC
  Administered 2011-11-12: 5 [IU] via SUBCUTANEOUS

## 2011-11-12 MED ORDER — BISACODYL 5 MG PO TBEC: 5.0000 mg | DELAYED_RELEASE_TABLET | Freq: Every day | ORAL | Status: DC | PRN

## 2011-11-12 MED ORDER — VANCOMYCIN HCL IN DEXTROSE 1-5 GM/200ML-% IV SOLN
1000.0000 mg | Freq: Three times a day (TID) | INTRAVENOUS | Status: DC
  Administered 2011-11-12 – 2011-11-13 (×4): 1000 mg via INTRAVENOUS
  Filled 2011-11-12 (×5): qty 200

## 2011-11-12 MED ORDER — INSULIN GLARGINE 100 UNIT/ML ~~LOC~~ SOLN
15.0000 [IU] | Freq: Every day | SUBCUTANEOUS | Status: DC
  Administered 2011-11-13: 15 [IU] via SUBCUTANEOUS

## 2011-11-12 MED ORDER — PIPERACILLIN-TAZOBACTAM 3.375 G IVPB
3.3750 g | Freq: Three times a day (TID) | INTRAVENOUS | Status: DC
  Administered 2011-11-12 – 2011-11-13 (×4): 3.375 g via INTRAVENOUS
  Filled 2011-11-12 (×6): qty 50

## 2011-11-12 MED ORDER — ACETAMINOPHEN 325 MG PO TABS: 650.0000 mg | ORAL_TABLET | Freq: Four times a day (QID) | ORAL | Status: DC | PRN

## 2011-11-12 MED ORDER — INSULIN ASPART 100 UNIT/ML ~~LOC~~ SOLN
0.0000 [IU] | Freq: Four times a day (QID) | SUBCUTANEOUS | Status: DC
  Administered 2011-11-12 (×2): 5 [IU] via SUBCUTANEOUS

## 2011-11-12 MED ORDER — SODIUM CHLORIDE 0.9 % IV SOLN
INTRAVENOUS | Status: DC
  Administered 2011-11-12: 03:00:00 via INTRAVENOUS

## 2011-11-12 MED ORDER — VANCOMYCIN HCL 1000 MG IV SOLR
2000.0000 mg | Freq: Once | INTRAVENOUS | Status: AC
  Administered 2011-11-12: 2000 mg via INTRAVENOUS
  Filled 2011-11-12: qty 2000

## 2011-11-12 MED ORDER — ONDANSETRON HCL 4 MG PO TABS: 4.0000 mg | ORAL_TABLET | Freq: Four times a day (QID) | ORAL | Status: DC | PRN

## 2011-11-12 MED ORDER — MORPHINE SULFATE 4 MG/ML IJ SOLN: 4.0000 mg | INTRAMUSCULAR | Status: DC | PRN

## 2011-11-12 MED ORDER — ALUM & MAG HYDROXIDE-SIMETH 200-200-20 MG/5ML PO SUSP: 30.0000 mL | Freq: Four times a day (QID) | ORAL | Status: DC | PRN

## 2011-11-12 MED ORDER — OXYCODONE HCL 5 MG PO TABS: 5.0000 mg | ORAL_TABLET | ORAL | Status: DC | PRN

## 2011-11-12 MED ORDER — INSULIN ASPART 100 UNIT/ML ~~LOC~~ SOLN
0.0000 [IU] | Freq: Three times a day (TID) | SUBCUTANEOUS | Status: DC
  Administered 2011-11-12: 8 [IU] via SUBCUTANEOUS
  Administered 2011-11-13: 11 [IU] via SUBCUTANEOUS
  Administered 2011-11-13: 8 [IU] via SUBCUTANEOUS

## 2011-11-12 MED ORDER — ACETAMINOPHEN 650 MG RE SUPP: 650.0000 mg | Freq: Four times a day (QID) | RECTAL | Status: DC | PRN

## 2011-11-12 MED ORDER — ACETAMINOPHEN 325 MG PO TABS
650.0000 mg | ORAL_TABLET | Freq: Four times a day (QID) | ORAL | Status: DC
  Administered 2011-11-12 – 2011-11-13 (×4): 650 mg via ORAL
  Filled 2011-11-12 (×4): qty 2

## 2011-11-12 MED ORDER — ONDANSETRON HCL 4 MG/2ML IJ SOLN: 4.0000 mg | Freq: Four times a day (QID) | INTRAMUSCULAR | Status: DC | PRN

## 2011-11-12 MED ORDER — SENNOSIDES-DOCUSATE SODIUM 8.6-50 MG PO TABS: 1.0000 | ORAL_TABLET | Freq: Every evening | ORAL | Status: DC | PRN

## 2011-11-12 MED ORDER — ZOLPIDEM TARTRATE 5 MG PO TABS
5.0000 mg | ORAL_TABLET | Freq: Every evening | ORAL | Status: DC | PRN
  Administered 2011-11-13: 5 mg via ORAL
  Filled 2011-11-12: qty 1

## 2011-11-12 NOTE — Progress Notes (Signed)
Utilization review complete 

## 2011-11-12 NOTE — Progress Notes (Signed)
PHARMACY - ANTIBIOTIC CONSULT  Initial Consult Note  Pharmacy Consult for: Vancomycin, Zosyn  Indication: Thigh abscess   Patient Data:   Allergies: No Known Allergies  Patient Measurements: Height: 5\' 9"  (175.3 cm) Weight: 271 lb 6.2 oz (123.1 kg) IBW/kg (Calculated) : 70.7    Vital Signs: Temp:  [98 F (36.7 C)-98.2 F (36.8 C)] 98 F (36.7 C) (04/22 2249) Pulse Rate:  [89-113] 98  (04/22 2249) Resp:  [16-20] 20  (04/22 2249) BP: (154-183)/(91-109) 161/101 mmHg (04/22 2249) SpO2:  [92 %-100 %] 92 % (04/22 2249) Weight:  [271 lb 6.2 oz (123.1 kg)] 271 lb 6.2 oz (123.1 kg) (04/22 2249)  BMI: Estimated Body mass index is 40.08 kg/(m^2) as calculated from the following:   Height as of this encounter: 5\' 9" (1.753 m).   Weight as of this encounter: 271 lb 6.2 oz(123.1 kg).  Intake/Output from previous day:  Intake/Output Summary (Last 24 hours) at 11/12/11 0119 Last data filed at 11/11/11 2110  Gross per 24 hour  Intake   1500 ml  Output     50 ml  Net   1450 ml    Labs:  Basename 11/11/11 2029 11/11/11 0947  WBC -- 11.7*  HGB 13.3 13.2  PLT -- 349  LABCREA -- --  CREATININE -- 1.01   Estimated Creatinine Clearance: 121.1 ml/min (by C-G formula based on Cr of 1.01). No results found for this basename: VANCOTROUGH:2,VANCOPEAK:2,VANCORANDOM:2,GENTTROUGH:2,GENTPEAK:2,GENTRANDOM:2,TOBRATROUGH:2,TOBRAPEAK:2,TOBRARND:2,AMIKACINPEAK:2,AMIKACINTROU:2,AMIKACIN:2, in the last 72 hours   Microbiology: No results found for this or any previous visit (from the past 720 hour(s)).  Medical History: Past Medical History  Diagnosis Date  . Abscess   . Hypertension   . Diabetes mellitus     Scheduled Medications:     . sodium chloride   Intravenous Once  . HYDROmorphone      . insulin aspart  0-9 Units Subcutaneous Q6H  . insulin glargine  5 Units Subcutaneous QHS  . morphine   Intravenous Q4H  . piperacillin-tazobactam (ZOSYN)  IV  3.375 g Intravenous Once  .  vancomycin  1,000 mg Intravenous Once  . DISCONTD: sodium chloride   Intravenous STAT    Assessment:  46 y.o. male admitted on 11/11/2011, with thigh abscess now s/p I&D. Pharmacy consulted to manage vancomycin and Zosyn.  Goal of Therapy:  1. Vancomycin trough level 10-15 mcg/ml  Plan:  1. Zosyn 3.375gm IV Q8H.  2. Vancomycin 2 gm IV x 1, then 1gm IV Q8H.   Monia Sabal Doy Mince, PharmD 11/12/2011, 1:19 AM

## 2011-11-12 NOTE — Progress Notes (Signed)
   CARE MANAGEMENT NOTE 11/12/2011  Patient:  Jeremy [name], Jeremy [name]   Account Number:  [id number]  Date Initiated:  11/12/2011  Documentation initiated by:  Marvetta Gibbons  Subjective/Objective Assessment:   Pt admitted with thigh abscess, DM     Action/Plan:   PTA pt lived at home with girlfriend, was independent with ADLS,   Anticipated DC Date:  11/14/2011   Anticipated DC Plan:  [place]  CM consult      [facility] Choice  HOME HEALTH   Choice offered to / List presented to:  C-1 Patient        [place] arranged  HH-1 RN      [place].   Status of service:  In process, will continue to follow Medicare Important Message given?   (If response is "NO", the following Medicare IM given date fields will be blank) Date Medicare IM given:   Date Additional Medicare IM given:    Discharge Disposition:    Per UR Regulation:    If discussed at Long Length of Stay Meetings, dates discussed:    Comments:  11/12/11- 1200- Marvetta Gibbons RN, BSN [phone] Spoke with pt at bedside along with girlfriend. Per conversation pt states that he does not have a PCP and has a practice in mind- encouraged pt to go ahead and call practice to see if they are taking new patients and schedule an appointment- if practice is not taking new pt- informed pt he could contact his insurance provider to assist him or this CM could give him Health Connect number. Pt states he will f/u on finding PCP. Order also for [place] for wound care- list of [facility] agencies provided to pt- pt wants to use [facility] agency. Referral sent via [facility] and call made to [facility] wit [facility]. [facility] services will begin 24-48 post discharge. CM will f/u with pt regarding PCP.

## 2011-11-12 NOTE — Progress Notes (Signed)
[facility] PROGRESS NOTE  Benajmin Dunham [id number] DOB: 1965-12-01 DOA: 11/11/2011 PCP: None.  Assessment/Plan: 1. Right thigh abscess: Status post I&D. Continue IV antibiotics. Dressing changes. Will need home health for dressing changes. Culture thus far notable for gram-positive cocci and gram-negative rods. 2. Diabetes mellitus type 2: Poor control. Increase Lantus. Continue sliding scale insulin. Diabetic teaching, inpatient diabetic coordinator consultation, nutrition consultation. Thus far patient is refusing insulin as an outpatient.  Code Status: Full code Family Communication:  Disposition Plan: Home when improved.  Murray Hodgkins, MD  [facility] Pager [phone] 8AM-8PM  If 8PM-8AM, please contact floor/night-coverage [email or web address] Password [email or web address] 11/12/2011, 12:21 PM  LOS: 1 day   Brief narrative: 46 year old man with a history of uncontrolled diabetes presented to the emergency department with a complaint of one-2 week pain and drainage from his right thigh. Found to have severe infection and admitted for further evaluation. Subsequently underwent incision and drainage.  Past medical history: Diabetes mellitus type 2  Consultants:  General surgery  Procedures:  April 22: Irrigation and debridement right thigh  Antibiotics:  April 22: Vancomycin  April 22: Zosyn  Interim History: Chart reviewed in detail and summarized as above. Status post surgery.  Subjective: Feels better. Little pain.  Objective: Filed Vitals:   11/11/11 2249 11/12/11 0200 11/12/11 0600 11/12/11 0949  BP: 161/101 153/90 137/83 153/91  Pulse: 98 87 89 88  Temp: 98 F (36.7 C) 97.3 F (36.3 C) 97.7 F (36.5 C) 97.8 F (36.6 C)  TempSrc: Oral Oral Oral Oral  Resp: 20 20 20 18   Height: 5\' 9"  (1.753 m)     Weight: 123.1 kg (271 lb 6.2 oz)     SpO2: 92% 97% 94% 96%    Intake/Output Summary (Last 24 hours) at 11/12/11 1221 Last data filed at  11/12/11 0700  Gross per 24 hour  Intake 2288.75 ml  Output    650 ml  Net 1638.75 ml    Exam:   General:  Appears calm. Nontoxic.  Cardiovascular: Regular rate and rhythm. No murmur, rub, gallop.  Respiratory: Clear to auscultation bilaterally. No wheezes, rales, rhonchi. Normal respiratory effort  Psychiatric: Grossly normal mood and affect. Speech fluent and appropriate.  Data Reviewed: Basic Metabolic Panel:  Lab [id number] 0645 11/11/11 2029 11/11/11 0947  NA 134* 138 131*  K 4.2 3.8 --  CL 99 -- 92*  CO2 27 -- 29  GLUCOSE 315* 185* 427*  BUN 12 -- 14  CREATININE 0.95 -- 1.01  CALCIUM 8.9 -- 9.8  MG -- -- --  PHOS -- -- --   CBC:  Lab 11/12/11 0645 11/11/11 2029 11/11/11 0947  WBC 10.7* -- 11.7*  NEUTROABS -- -- 8.4*  HGB 12.1* 13.3 13.2  HCT 34.4* 39.0 37.5*  MCV 75.1* -- 74.9*  PLT 325 -- 349   CBG:  Lab 11/12/11 1124 11/12/11 0651 11/11/11 2112 11/11/11 0916  GLUCAP 261* 300* 167* 397*    Recent Results (from the past 240 hour(s))  CULTURE, BLOOD (ROUTINE X 2)     Status: Normal (Preliminary result)   Collection Time   11/11/11 10:00 AM      Component Value Range Status Comment   Specimen Description BLOOD RIGHT ARM   Final    Special Requests BOTTLES DRAWN AEROBIC AND ANAEROBIC 10CC   Final    Culture  Setup Time [id number]   Final    Culture     Final    Value:  BLOOD CULTURE RECEIVED NO GROWTH TO DATE CULTURE WILL BE HELD FOR 5 DAYS BEFORE ISSUING A FINAL NEGATIVE REPORT   Report Status PENDING   Incomplete   CULTURE, BLOOD (ROUTINE X 2)     Status: Normal (Preliminary result)   Collection Time   11/11/11 10:05 AM      Component Value Range Status Comment   Specimen Description BLOOD LEFT ARM   Final    Special Requests BOTTLES DRAWN AEROBIC AND ANAEROBIC 10CC   Final    Culture  Setup Time [id number]   Final    Culture     Final    Value:        BLOOD CULTURE RECEIVED NO GROWTH TO DATE CULTURE WILL BE HELD FOR 5 DAYS BEFORE ISSUING  A FINAL NEGATIVE REPORT   Report Status PENDING   Incomplete   CULTURE, ROUTINE-ABSCESS     Status: Normal (Preliminary result)   Collection Time   11/11/11  9:25 PM      Component Value Range Status Comment   Specimen Description ABSCESS PERINEAL   Final    Special Requests PT ON ZOSYN,VANCOMYCIN   Final    Gram Stain     Final    Value: NO WBC SEEN     NO SQUAMOUS EPITHELIAL CELLS SEEN     MODERATE GRAM POSITIVE COCCI IN PAIRS     FEW GRAM NEGATIVE RODS   Culture Culture reincubated for better growth   Final    Report Status PENDING   Incomplete   ANAEROBIC CULTURE     Status: Normal (Preliminary result)   Collection Time   11/11/11  9:25 PM      Component Value Range Status Comment   Specimen Description ABSCESS PERINEAL   Final    Special Requests PT ON ZOSYN,VANC   Final    Gram Stain     Final    Value: NO WBC SEEN     NO SQUAMOUS EPITHELIAL CELLS SEEN     MODERATE GRAM POSITIVE COCCI IN PAIRS     FEW GRAM NEGATIVE RODS   Culture PENDING   Incomplete    Report Status PENDING   Incomplete     Studies: Ct Pelvis W Contrast  11/11/2011  *RADIOLOGY REPORT*  Clinical Data:  Peritesticular abscesses.  CT PELVIS WITH CONTRAST IMPRESSION: Extensive cellulitis involving the medial aspect of the proximal right thigh, with locules of gas, indicating either ulceration or the presence of a gas-forming organism.  No organized fluid collection. These results were called by telephone on 11/11/2011 at  1225 hours to  Abran Richard, who verbally acknowledged these results.  Original Report Authenticated By: Luretha Rued, M.D.   Scheduled Meds:   . HYDROmorphone      . insulin aspart  0-9 Units Subcutaneous Q6H  . insulin glargine  5 Units Subcutaneous QHS  . morphine   Intravenous Q4H  . piperacillin-tazobactam (ZOSYN)  IV  3.375 g Intravenous Once  . piperacillin-tazobactam (ZOSYN)  IV  3.375 g Intravenous Q8H  . vancomycin  2,000 mg Intravenous Once  . vancomycin  1,000 mg  Intravenous Q8H  . DISCONTD: sodium chloride   Intravenous STAT   Continuous Infusions:   . DISCONTD: sodium chloride 75 mL/hr at 11/12/11 0249

## 2011-11-12 NOTE — Progress Notes (Signed)
Patient ID: Jeremy [name], male   DOB: [birth year], 46 y.o.   MRN: [id number] 1 Day Post-Op  Subjective: Pt feels better.  Still having some pain though.  Objective: Vital signs in last 24 hours: Temp:  [97.3 F (36.3 C)-98.2 F (36.8 C)] 97.8 F (36.6 C) (04/23 0949) Pulse Rate:  [87-98] 88  (04/23 0949) Resp:  [18-20] 18  (04/23 0949) BP: (137-183)/(83-102) 153/91 mmHg (04/23 0949) SpO2:  [92 %-97 %] 96 % (04/23 0949) Weight:  [271 lb 6.2 oz (123.1 kg)] 271 lb 6.2 oz (123.1 kg) (04/22 2249) Last BM Date: 11/06/11  Intake/Output from previous day: 04/22 0701 - 04/23 0700 In: 2288.8 [I.V.:1738.8; IV Piggyback:550] Out: 650 [Urine:600; Blood:50] Intake/Output this shift:    PE: Wound: relatively clean, no purulent drainage.  Packing with some expected drainage.  No further spread of infection noted.  Lab Results:   Basename 11/12/11 0645 11/11/11 2029 11/11/11 0947  WBC 10.7* -- 11.7*  HGB 12.1* 13.3 --  HCT 34.4* 39.0 --  PLT 325 -- 349   BMET  Basename 11/12/11 0645 11/11/11 2029 11/11/11 0947  NA 134* 138 --  K 4.2 3.8 --  CL 99 -- 92*  CO2 27 -- 29  GLUCOSE 315* 185* --  BUN 12 -- 14  CREATININE 0.95 -- 1.01  CALCIUM 8.9 -- 9.8   PT/INR No results found for this basename: LABPROT:2,INR:2 in the last 72 hours CMP     Component Value Date/Time   NA 134* 11/12/2011 0645   K 4.2 11/12/2011 0645   CL 99 11/12/2011 0645   CO2 27 11/12/2011 0645   GLUCOSE 315* 11/12/2011 0645   BUN 12 11/12/2011 0645   CREATININE 0.95 11/12/2011 0645   CALCIUM 8.9 11/12/2011 0645   GFRNONAA >90 11/12/2011 0645   GFRAA >90 11/12/2011 0645   Lipase  No results found for this basename: lipase       Studies/Results: Ct Pelvis W Contrast  11/11/2011  *RADIOLOGY REPORT*  Clinical Data:  Peritesticular abscesses.  CT PELVIS WITH CONTRAST  Technique:  Multidetector CT imaging of the pelvis was performed using the standard protocol following the bolus administration of intravenous  contrast.  Contrast: 120mL OMNIPAQUE IOHEXOL 300 MG/ML  SOLN  Comparison:  None.  Findings:  Small periumbilical hernia contains fat.  Visualized portion of the bowel is unremarkable.  No pathologically enlarged lymph nodes.  No free fluid.  Scattered atherosclerotic calcification of the arterial vasculature.  There is skin thickening and edema involving the medial aspect of the proximal right thigh.  Locules of gas are seen within the skin and just deep to the skin surface.  No organized fluid collection.  Incidental note is made of a lipoma in the anterior right thigh musculature.  No worrisome lytic or sclerotic lesions.  IMPRESSION: Extensive cellulitis involving the medial aspect of the proximal right thigh, with locules of gas, indicating either ulceration or the presence of a gas-forming organism.  No organized fluid collection. These results were called by telephone on 11/11/2011 at  1225 hours to  Abran Richard, who verbally acknowledged these results.  Original Report Authenticated By: Luretha Rued, M.D.    Anti-infectives: Anti-infectives     Start     Dose/Rate Route Frequency Ordered Stop   11/12/11 1000   vancomycin (VANCOCIN) IVPB 1000 mg/200 mL premix        1,000 mg 200 mL/hr over 60 Minutes Intravenous Every 8 hours 11/12/11 0125     11/12/11 0200  piperacillin-tazobactam (ZOSYN) IVPB 3.375 g       3.375 g 12.5 mL/hr over 240 Minutes Intravenous 3 times per day 11/12/11 0125     11/12/11 0200   vancomycin (VANCOCIN) 2,000 mg in sodium chloride 0.9 % 500 mL IVPB        2,000 mg 250 mL/hr over 120 Minutes Intravenous  Once 11/12/11 0125 11/12/11 0448   11/11/11 1330  piperacillin-tazobactam (ZOSYN) IVPB 3.375 g       3.375 g 12.5 mL/hr over 240 Minutes Intravenous  Once 11/11/11 1326 11/11/11 1738   11/11/11 1000   vancomycin (VANCOCIN) IVPB 1000 mg/200 mL premix        1,000 mg 200 mL/hr over 60 Minutes Intravenous  Once 11/11/11 0950 11/11/11 1131            Assessment/Plan  1. Right thigh abscess, s/p I&D  Plan: 1. Cont abx 2. Start dressing changes 3. He will need HH for dressing changes.   LOS: 1 day    Ladonne Sharples E 11/12/2011

## 2011-11-12 NOTE — Progress Notes (Signed)
[place]   Agencies that are Medicare-Certified and are affiliated with [employer] [place]  Telephone Number Address  Ashley has ownership interest in this company; however, you are under no obligation to use this agency. [phone] or  [place], [place] [address]   Agencies that are Medicare-Certified and are not affiliated with [employer] [place] Telephone Number Address  [facility] [phone] Fax [phone] [address], [place], [place]  [address]  [facility] [phone] or [phone] Fax [phone] [id number] [place], [place] [address]  [facility] [phone] Fax [phone] [place], [place] [address]  [place] [phone] Fax [phone] [address], [place], [place]  [address]  [facility] [phone] Fax [phone] [address], [place], [place] [address]  [facility] [place], [place] [address]  [facility] [phone]  [address], [place] [address]  [facility] [phone] or [phone] Fax [phone] [place] [address] [place] [address] [place], [place]  [address]  [facility] [phone] Fax [phone] [place], [place]  [address]  [place]  [phone] Fax [phone] [address] [place], [place] [address]               Agencies that are not Medicare-Certified and are not affiliated with [facility] [place] Telephone Number Address  [place] or [phone] Fax [phone] [address] [place]  [address]  [facility] [phone] Fax [phone] [address] [place], [place]  [address]  [facility]  [phone] Fax [phone] [address] [place], [place] [address]  [facility] [phone] Fax [phone] [address] [place], [place] [address]  [facility] [phone] or [phone] Fax [phone] [address], [place], [place]  [address]  [facility] [phone] or [phone] Fax [phone] [address] [place], [place]  [address]  [facility] [phone] Fax [phone] [id number] [place], [place]  [address]  [facility] [phone] [address] [place], [place]  [address]  [facility] [phone] Fax [phone] [address] [place], [place]  [address]  [place]. [phone] Fax [phone] [address] [place], [place]  [address]  [facility] [phone] Fax [phone] [address] [place], [place] [address]  [facility] [phone] Fax [phone] [address] [place],   [address]

## 2011-11-12 NOTE — Progress Notes (Signed)
Clinical Social Work  CSW received inappropriate referral for assistance for [facility] and medications. CM is aware. CSW is signing off but is available if needs arise.  [place], [place] [phone] (Coverage)

## 2011-11-12 NOTE — Progress Notes (Signed)
Work on controlling glucose/DM better

## 2011-11-12 NOTE — Progress Notes (Signed)
Inpatient Diabetes Program Recommendations  AACE/ADA: New Consensus Statement on Inpatient Glycemic Control (2009)  Target Ranges:  Prepandial:   less than 140 mg/dL      Peak postprandial:   less than 180 mg/dL (1-2 hours)      Critically ill patients:  140 - 180 mg/dL   Reason for Visit: Hyperglycemia and Diabetes Consult 46 year-old man with a history of uncontrolled diabetes presented to the emergency department with a complaint of one-2 week pain and drainage from his right thigh. It has become progressively more painful and he is without relief. Pt states he has previously been on "diabetes pills" but quit taking them because his blood sugar was improved.  Said he eats sweets, lots of fried foods and drinks large quantities of regular soda.  "I know what I need to do, so I will get back on track."  Discussed importance of controlling blood sugars at home.  Discussed diet, exercise, and possible initiation of insulin.  Pt refuses to go on insulin at this point.  Educated on benefits of insulin and how blood sugar management is crucial for healing and reduction of long-term complications.  Will need prescription for meter and supplies.  He would benefit from OP Diabetes Education consult - especially for dietary management.      Inpatient Diabetes Program Recommendations Insulin - Basal: Increased Lantus to 15 units QHS Correction (SSI): Increase Novolog to resistant tidwc and hs Insulin - Meal Coverage: Add Novolog 3 units tidwc for meal coverage insulin HgbA1C: Pending Outpatient Referral: OP Diabetes Education consult for uncontrolled diabetes.  Note: Will order Living Well With Diabetes book and encouraged pt to view diabetes videos on pt ed channel.  Will followup in am.  Pt wants to see Dr[facility]iah Lobo at [facility] as his PCP after discharge.

## 2011-11-13 LAB — GLUCOSE, CAPILLARY: Glucose-Capillary: 252 mg/dL — ABNORMAL HIGH (ref 70–99)

## 2011-11-13 MED ORDER — INSULIN ASPART 100 UNIT/ML ~~LOC~~ SOLN: SUBCUTANEOUS | Status: DC

## 2011-11-13 MED ORDER — FREESTYLE SYSTEM KIT: 1.0000 | PACK | Freq: Three times a day (TID) | Status: AC

## 2011-11-13 MED ORDER — METFORMIN HCL 850 MG PO TABS
850.0000 mg | ORAL_TABLET | Freq: Two times a day (BID) | ORAL | Status: DC
  Filled 2011-11-13 (×3): qty 1

## 2011-11-13 MED ORDER — INSULIN REGULAR HUMAN 100 UNIT/ML IJ SOLN: 3.0000 [IU] | Freq: Three times a day (TID) | INTRAMUSCULAR | Status: DC

## 2011-11-13 MED ORDER — INSULIN GLARGINE 100 UNIT/ML ~~LOC~~ SOLN
20.0000 [IU] | Freq: Every day | SUBCUTANEOUS | Status: DC
Start: 2011-11-13 — End: 2011-11-13

## 2011-11-13 MED ORDER — METFORMIN HCL 850 MG PO TABS: 850.0000 mg | ORAL_TABLET | Freq: Two times a day (BID) | ORAL | Status: DC

## 2011-11-13 MED ORDER — OXYCODONE HCL 5 MG PO TABS: 5.0000 mg | ORAL_TABLET | ORAL | Status: AC | PRN

## 2011-11-13 MED ORDER — METOPROLOL TARTRATE 50 MG PO TABS
50.0000 mg | ORAL_TABLET | Freq: Two times a day (BID) | ORAL | Status: DC
  Administered 2011-11-13: 50 mg via ORAL
  Filled 2011-11-13 (×2): qty 1

## 2011-11-13 MED ORDER — LISINOPRIL 5 MG PO TABS
5.0000 mg | ORAL_TABLET | Freq: Every day | ORAL | Status: DC
  Administered 2011-11-13: 5 mg via ORAL
  Filled 2011-11-13: qty 1

## 2011-11-13 MED ORDER — LISINOPRIL 5 MG PO TABS: 5.0000 mg | ORAL_TABLET | Freq: Every day | ORAL | Status: DC

## 2011-11-13 MED ORDER — LEVOFLOXACIN 750 MG PO TABS: 750.0000 mg | ORAL_TABLET | Freq: Every day | ORAL | Status: AC

## 2011-11-13 MED ORDER — AMOXICILLIN-POT CLAVULANATE 875-125 MG PO TABS: 1.0000 | ORAL_TABLET | Freq: Two times a day (BID) | ORAL | Status: AC

## 2011-11-13 MED ORDER — METOPROLOL TARTRATE 50 MG PO TABS: 50.0000 mg | ORAL_TABLET | Freq: Two times a day (BID) | ORAL | Status: DC

## 2011-11-13 MED ORDER — INSULIN ASPART 100 UNIT/ML ~~LOC~~ SOLN: 3.0000 [IU] | Freq: Three times a day (TID) | SUBCUTANEOUS | Status: DC

## 2011-11-13 MED ORDER — AMOXICILLIN-POT CLAVULANATE 875-125 MG PO TABS
1.0000 | ORAL_TABLET | Freq: Two times a day (BID) | ORAL | Status: DC
  Filled 2011-11-13 (×2): qty 1

## 2011-11-13 MED ORDER — INSULIN GLARGINE 100 UNIT/ML ~~LOC~~ SOLN: 20.0000 [IU] | Freq: Every day | SUBCUTANEOUS | Status: DC

## 2011-11-13 MED ORDER — METFORMIN HCL 850 MG PO TABS
850.0000 mg | ORAL_TABLET | Freq: Two times a day (BID) | ORAL | Status: DC
  Filled 2011-11-13: qty 1

## 2011-11-13 MED ORDER — INSULIN ASPART 100 UNIT/ML ~~LOC~~ SOLN
3.0000 [IU] | Freq: Three times a day (TID) | SUBCUTANEOUS | Status: DC
  Administered 2011-11-13: 3 [IU] via SUBCUTANEOUS

## 2011-11-13 NOTE — Progress Notes (Signed)
Complete ABx course as out patient w close follow-up [place] is better DMN control to prevent worsening of current & prevention of further infection episodes

## 2011-11-13 NOTE — Discharge Instructions (Signed)
HH-RN arranged with [facility] [phone]  Pack wound twice a day with normal saline moistened gauze and dry gauze to cover wound.  Remove packing before taking a shower.  Follow with Primary MD  in 3 days   Get CBC, BMP, all culture results checked in 3 days by Primary MD and again as instructed by your Primary MD.    Accuchecks 4 times/day, Once in AM empty stomach and then before each meal. Log in all results and show them to your Prim.MD in 3 days. If any glucose reading is under 80 or above 300 call your Prim MD immidiately. Follow Low glucose instructions for glucose under 80 as instructed.  Get Medicines reviewed and adjusted.  Please request your Prim.MD to go over all Hospital Tests and Procedure/Radiological results at the follow up, please get all Hospital records sent to your Prim MD by signing hospital release before you go home.   Activity: Fall precautions use Joshi/cane & assistance as needed  Diet: Low Carb   For Heart failure patients - Check your Weight same time everyday, if you gain over 2 pounds, or you develop in leg swelling, experience more shortness of breath or chest pain, call your Primary MD immediately. Follow Cardiac Low Salt Diet and 1.8 lit/day fluid restriction.  Disposition Home  If you experience worsening of your admission symptoms, develop shortness of breath, life threatening emergency, suicidal or homicidal thoughts you must seek medical attention immediately by calling 911 or calling your MD immediately  if symptoms less severe.  You Must read complete instructions/literature along with all the possible adverse reactions/side effects for all the Medicines you take and that have been prescribed to you. Take any new Medicines after you have completely understood and accpet all the possible adverse reactions/side effects.   Do not drive if your were admitted for syncope or siezures until you have seen by Primary MD or a Neurologist and  advised to drive.  Do not drive when taking Pain medications.    Do not take more than prescribed Pain, Sleep and Anxiety Medications  Special Instructions: If you have smoked or chewed Tobacco  in the last 2 yrs please stop smoking, stop any regular Alcohol  and or any Recreational drug use.  Wear Seat belts while driving.

## 2011-11-13 NOTE — Progress Notes (Addendum)
Masaaki Sturgell [id number] is a 46 y.o. male,  Outpatient Primary MD for the patient is Pcp Not In System  Chief Complaint  Patient presents with  . Abscess        Subjective:   [name] today has, No headache, No chest pain, No abdominal pain - No Nausea, No new weakness tingling or numbness, No Cough - SOB.    Objective:   Filed Vitals:   11/12/11 1331 11/12/11 1800 11/12/11 2200 11/13/11 0600  BP: 156/98 152/95 171/98 165/98  Pulse: 97 86 94 85  Temp: 98.5 F (36.9 C) 98 F (36.7 C) 98 F (36.7 C) 97.4 F (36.3 C)  TempSrc: Oral Oral Oral Oral  Resp: 20 18 18 18   Height:      Weight:      SpO2: 97% 95% 98% 98%    Wt Readings from Last 3 Encounters:  11/11/11 123.1 kg (271 lb 6.2 oz)  11/11/11 123.1 kg (271 lb 6.2 oz)     Intake/Output Summary (Last 24 hours) at 11/13/11 0901 Last data filed at 11/13/11 0837  Gross per 24 hour  Intake    480 ml  Output      0 ml  Net    480 ml    Exam Awake Alert, Oriented *3, No new F.N deficits, Normal affect [place].AT,PERRAL Supple Neck,No JVD, No cervical lymphadenopathy appriciated.  Symmetrical Chest wall movement, Good air movement bilaterally, CTAB RRR,No Gallops,Rubs or new Murmurs, No Parasternal Heave +ve B.Sounds, Abd Soft, Non tender, No organomegaly appriciated, No rebound -guarding or rigidity. No Cyanosis, Clubbing or edema, No new Rash or bruise    Data Review  CBC  Lab 11/12/11 0645 11/11/11 2029 11/11/11 0947  WBC 10.7* -- 11.7*  HGB 12.1* 13.3 13.2  HCT 34.4* 39.0 37.5*  PLT 325 -- 349  MCV 75.1* -- 74.9*  MCH 26.4 -- 26.3  MCHC 35.2 -- 35.2  RDW 12.9 -- 12.7  LYMPHSABS -- -- 1.4  MONOABS -- -- 1.8*  EOSABS -- -- 0.1  BASOSABS -- -- 0.0  BANDABS -- -- --    Chemistries   Lab 11/12/11 0645 11/11/11 2029 11/11/11 0947  NA 134* 138 131*  K 4.2 3.8 4.4  CL 99 -- 92*  CO2 27 -- 29  GLUCOSE 315* 185* 427*  BUN 12 -- 14  CREATININE 0.95 -- 1.01  CALCIUM 8.9 -- 9.8  MG --  -- --  AST -- -- --  ALT -- -- --  ALKPHOS -- -- --  BILITOT -- -- --   ------------------------------------------------------------------------------------------------------------------ estimated creatinine clearance is 128.7 ml/min (by C-G formula based on Cr of 0.95). ------------------------------------------------------------------------------------------------------------------  [facility] 11/12/11 0645  HGBA1C 11.9*   ------------------------------------------------------------------------------------------------------------------ No results found for this basename: CHOL:2,HDL:2,LDLCALC:2,TRIG:2,CHOLHDL:2,LDLDIRECT:2 in the last 72 hours ------------------------------------------------------------------------------------------------------------------ No results found for this basename: TSH,T4TOTAL,FREET3,T3FREE,THYROIDAB in the last 72 hours ------------------------------------------------------------------------------------------------------------------ No results found for this basename: VITAMINB12:2,FOLATE:2,FERRITIN:2,TIBC:2,IRON:2,RETICCTPCT:2 in the last 72 hours  Coagulation profile No results found for this basename: INR:5,PROTIME:5 in the last 168 hours  No results found for this basename: DDIMER:2 in the last 72 hours  Cardiac Enzymes No results found for this basename: CK:3,CKMB:3,TROPONINI:3,MYOGLOBIN:3 in the last 168 hours ------------------------------------------------------------------------------------------------------------------ No components found with this basename: POCBNP:3  Micro Results Recent Results (from the past 240 hour(s))  CULTURE, BLOOD (ROUTINE X 2)     Status: Normal (Preliminary result)   Collection Time   11/11/11 10:00 AM      Component Value Range Status Comment   Specimen  Description BLOOD RIGHT ARM   Final    Special Requests BOTTLES DRAWN AEROBIC AND ANAEROBIC 10CC   Final    Culture  Setup Time [id number]   Final    Culture      Final    Value:        BLOOD CULTURE RECEIVED NO GROWTH TO DATE CULTURE WILL BE HELD FOR 5 DAYS BEFORE ISSUING A FINAL NEGATIVE REPORT   Report Status PENDING   Incomplete   CULTURE, BLOOD (ROUTINE X 2)     Status: Normal (Preliminary result)   Collection Time   11/11/11 10:05 AM      Component Value Range Status Comment   Specimen Description BLOOD LEFT ARM   Final    Special Requests BOTTLES DRAWN AEROBIC AND ANAEROBIC 10CC   Final    Culture  Setup Time [id number]   Final    Culture     Final    Value:        BLOOD CULTURE RECEIVED NO GROWTH TO DATE CULTURE WILL BE HELD FOR 5 DAYS BEFORE ISSUING A FINAL NEGATIVE REPORT   Report Status PENDING   Incomplete   CULTURE, ROUTINE-ABSCESS     Status: Normal (Preliminary result)   Collection Time   11/11/11  9:25 PM      Component Value Range Status Comment   Specimen Description ABSCESS PERINEAL   Final    Special Requests PT ON ZOSYN,VANCOMYCIN   Final    Gram Stain     Final    Value: NO WBC SEEN     NO SQUAMOUS EPITHELIAL CELLS SEEN     MODERATE GRAM POSITIVE COCCI IN PAIRS     FEW GRAM NEGATIVE RODS   Culture Culture reincubated for better growth   Final    Report Status PENDING   Incomplete   AFB CULTURE WITH SMEAR     Status: Normal (Preliminary result)   Collection Time   11/11/11  9:25 PM      Component Value Range Status Comment   Specimen Description ABSCESS PERINEAL   Final    Special Requests PT ON ZOSYN,VANC   Final    ACID FAST SMEAR NO ACID FAST BACILLI SEEN   Final    Culture     Final    Value: CULTURE WILL BE EXAMINED FOR 6 WEEKS BEFORE ISSUING A FINAL REPORT   Report Status PENDING   Incomplete   ANAEROBIC CULTURE     Status: Normal (Preliminary result)   Collection Time   11/11/11  9:25 PM      Component Value Range Status Comment   Specimen Description ABSCESS PERINEAL   Final    Special Requests PT ON ZOSYN,VANC   Final    Gram Stain     Final    Value: NO WBC SEEN     NO SQUAMOUS EPITHELIAL CELLS SEEN      MODERATE GRAM POSITIVE COCCI IN PAIRS     FEW GRAM NEGATIVE RODS   Culture     Final    Value: NO ANAEROBES ISOLATED; CULTURE IN PROGRESS FOR 5 DAYS   Report Status PENDING   Incomplete   FUNGUS CULTURE W SMEAR     Status: Normal (Preliminary result)   Collection Time   11/11/11  9:25 PM      Component Value Range Status Comment   Specimen Description ABSCESS PERINEAL   Final    Special Requests PT ON ZOSYN,VANC   Final    Fungal Smear  NO YEAST OR FUNGAL ELEMENTS SEEN   Final    Culture CULTURE IN PROGRESS FOR FOUR WEEKS   Final    Report Status PENDING   Incomplete     Radiology Reports Ct Pelvis W Contrast  11/11/2011  *RADIOLOGY REPORT*  Clinical Data:  Peritesticular abscesses.  CT PELVIS WITH CONTRAST  Technique:  Multidetector CT imaging of the pelvis was performed using the standard protocol following the bolus administration of intravenous contrast.  Contrast: 124mL OMNIPAQUE IOHEXOL 300 MG/ML  SOLN  Comparison:  None.  Findings:  Small periumbilical hernia contains fat.  Visualized portion of the bowel is unremarkable.  No pathologically enlarged lymph nodes.  No free fluid.  Scattered atherosclerotic calcification of the arterial vasculature.  There is skin thickening and edema involving the medial aspect of the proximal right thigh.  Locules of gas are seen within the skin and just deep to the skin surface.  No organized fluid collection.  Incidental note is made of a lipoma in the anterior right thigh musculature.  No worrisome lytic or sclerotic lesions.  IMPRESSION: Extensive cellulitis involving the medial aspect of the proximal right thigh, with locules of gas, indicating either ulceration or the presence of a gas-forming organism.  No organized fluid collection. These results were called by telephone on 11/11/2011 at  1225 hours to  Abran Richard, who verbally acknowledged these results.  Original Report Authenticated By: Luretha Rued, M.D.    Scheduled Meds:   .  acetaminophen  650 mg Oral QID  . HYDROmorphone      . insulin aspart  0-15 Units Subcutaneous TID WC  . insulin glargine  20 Units Subcutaneous QHS  . insulin regular  3 Units Intravenous TID AC  . metFORMIN  850 mg Oral BID WC  . piperacillin-tazobactam (ZOSYN)  IV  3.375 g Intravenous Q8H  . vancomycin  1,000 mg Intravenous Q8H  . DISCONTD: insulin aspart  0-9 Units Subcutaneous Q6H  . DISCONTD: insulin glargine  15 Units Subcutaneous QHS  . DISCONTD: insulin glargine  5 Units Subcutaneous QHS  . DISCONTD: morphine   Intravenous Q4H   Continuous Infusions:  PRN Meds:.alum & mag hydroxide-simeth, bisacodyl, diphenhydrAMINE, diphenhydrAMINE, morphine, naloxone, ondansetron (ZOFRAN) IV, ondansetron, oxyCODONE, senna-docusate, sodium chloride, zolpidem, DISCONTD: acetaminophen, DISCONTD: acetaminophen, DISCONTD: ondansetron (ZOFRAN) IV  Assessment & Plan   Brief narrative:  46 year old man with a history of uncontrolled diabetes presented to the emergency department with a complaint of one-2 week pain and drainage from his right thigh. Found to have severe infection and admitted for further evaluation. Subsequently underwent incision and drainage.   Past medical history:  Diabetes mellitus type 2     Consultants:  General surgery    Procedures:  April 22: Irrigation and debridement right thigh    Antibiotics:  April 22: Vancomycin  April 22: Zosyn    1. Rt Perineal Abscess post I&D on April 22 - wound care continue, Lab called to update final Micro, D/W ID, likely Levaquin 750mg  Po for 10 more days will be OK with home health.   2. DM-2- poor control, adjusted Lantus-ISS-Premeal, add Glucophage from 25th, Insulin education, will try to switch on 70/30 no insurance.   3. HTN - poor control added Lopressor and Lisinopril.   DVT Prophylaxis   SCDs      Thurnell Lose M.D on 11/13/2011 at 9:01 AM  [facility] Office  [phone]

## 2011-11-13 NOTE — Progress Notes (Signed)
Patient ID: Jeremy [name], male   DOB: Jun 30, 1966, 46 y.o.   MRN: [id number] 2 Days Post-Op  Subjective: Pt feels ok.  Wants to go home.   Objective: Vital signs in last 24 hours: Temp:  [97.4 F (36.3 C)-98.5 F (36.9 C)] 97.4 F (36.3 C) (04/24 0600) Pulse Rate:  [85-97] 85  (04/24 0600) Resp:  [18-20] 18  (04/24 0600) BP: (152-171)/(95-98) 165/98 mmHg (04/24 0600) SpO2:  [95 %-98 %] 98 % (04/24 0600) Last BM Date: 11/12/11  Intake/Output from previous day:   Intake/Output this shift: Total I/O In: 480 [P.O.:480] Out: -   PE: Right leg: wound is clean and packed.  No purulent drainage, no further necrosis of skin.  Lab Results:   Basename 11/12/11 0645 11/11/11 2029 11/11/11 0947  WBC 10.7* -- 11.7*  HGB 12.1* 13.3 --  HCT 34.4* 39.0 --  PLT 325 -- 349   BMET  Basename 11/12/11 0645 11/11/11 2029 11/11/11 0947  NA 134* 138 --  K 4.2 3.8 --  CL 99 -- 92*  CO2 27 -- 29  GLUCOSE 315* 185* --  BUN 12 -- 14  CREATININE 0.95 -- 1.01  CALCIUM 8.9 -- 9.8   PT/INR No results found for this basename: LABPROT:2,INR:2 in the last 72 hours CMP     Component Value Date/Time   NA 134* 11/12/2011 0645   K 4.2 11/12/2011 0645   CL 99 11/12/2011 0645   CO2 27 11/12/2011 0645   GLUCOSE 315* 11/12/2011 0645   BUN 12 11/12/2011 0645   CREATININE 0.95 11/12/2011 0645   CALCIUM 8.9 11/12/2011 0645   GFRNONAA >90 11/12/2011 0645   GFRAA >90 11/12/2011 0645   Lipase  No results found for this basename: lipase       Studies/Results: Ct Pelvis W Contrast  11/11/2011  *RADIOLOGY REPORT*  Clinical Data:  Peritesticular abscesses.  CT PELVIS WITH CONTRAST  Technique:  Multidetector CT imaging of the pelvis was performed using the standard protocol following the bolus administration of intravenous contrast.  Contrast: 172mL OMNIPAQUE IOHEXOL 300 MG/ML  SOLN  Comparison:  None.  Findings:  Small periumbilical hernia contains fat.  Visualized portion of the bowel is unremarkable.  No  pathologically enlarged lymph nodes.  No free fluid.  Scattered atherosclerotic calcification of the arterial vasculature.  There is skin thickening and edema involving the medial aspect of the proximal right thigh.  Locules of gas are seen within the skin and just deep to the skin surface.  No organized fluid collection.  Incidental note is made of a lipoma in the anterior right thigh musculature.  No worrisome lytic or sclerotic lesions.  IMPRESSION: Extensive cellulitis involving the medial aspect of the proximal right thigh, with locules of gas, indicating either ulceration or the presence of a gas-forming organism.  No organized fluid collection. These results were called by telephone on 11/11/2011 at  1225 hours to  Abran Richard, who verbally acknowledged these results.  Original Report Authenticated By: Luretha Rued, M.D.    Anti-infectives: Anti-infectives     Start     Dose/Rate Route Frequency Ordered Stop   11/13/11 1145   amoxicillin-clavulanate (AUGMENTIN) 875-125 MG per tablet 1 tablet        1 tablet Oral Every 12 hours 11/13/11 1130     11/12/11 1000   vancomycin (VANCOCIN) IVPB 1000 mg/200 mL premix  Status:  Discontinued        1,000 mg 200 mL/hr over 60 Minutes Intravenous Every 8  hours 11/12/11 0125 11/13/11 1130   11/12/11 0200   piperacillin-tazobactam (ZOSYN) IVPB 3.375 g  Status:  Discontinued        3.375 g 12.5 mL/hr over 240 Minutes Intravenous 3 times per day 11/12/11 0125 11/13/11 1130   11/12/11 0200   vancomycin (VANCOCIN) 2,000 mg in sodium chloride 0.9 % 500 mL IVPB        2,000 mg 250 mL/hr over 120 Minutes Intravenous  Once 11/12/11 0125 11/12/11 0448   11/11/11 1330  piperacillin-tazobactam (ZOSYN) IVPB 3.375 g       3.375 g 12.5 mL/hr over 240 Minutes Intravenous  Once 11/11/11 1326 11/11/11 1738   11/11/11 1000   vancomycin (VANCOCIN) IVPB 1000 mg/200 mL premix        1,000 mg 200 mL/hr over 60 Minutes Intravenous  Once 11/11/11 0950  11/11/11 1131           Assessment/Plan  1. Right thigh abscess, s/p I&D  Plan: 1. [place] set up for dressing changes 2. Recommend another week of po augmentin at home. 3. Follow up with Dr. Marlou Starks in the office in 1-2 weeks.  Surgically stable for discharge.   LOS: 2 days    Natara Monfort E 11/13/2011

## 2011-11-13 NOTE — Discharge Summary (Addendum)
Jeremy [name], 46 y.o., DOB 1966/05/22, MRN [id number]. Admission date: 11/11/2011 Discharge Date 11/13/2011 Primary MD Pcp Not In System Admitting Physician Samuella Cota, MD  Admission Diagnosis  Diabetes mellitus [250] Hyperglycemia [790.29] Cellulitis of right thigh [682.6] BOILS BETWEEN LEGS  Discharge Diagnosis   Principal Problem:  *Thigh abscess, possible fournier's gangrene Active Problems:  Uncontrolled diabetes mellitus    Past Medical History  Diagnosis Date  . Abscess   . Hypertension   . Diabetes mellitus     Past Surgical History  Procedure Date  . Incision and drainage of wound   . I&d extremity 11/11/2011    Procedure: IRRIGATION AND DEBRIDEMENT EXTREMITY;  Surgeon: Merrie Roof, MD;  Location: [facility];  Service: General;  Laterality: Right;  I&D Right Thigh Abscess     Hospital Course See H&P, Labs, Consult and Test reports for all details in brief, patient was admitted for    1. Rt Perineal Abscess post I&D on April 22 - wound care continue will; get Home Health for continued wound care, Lab called to update final Micro likely group B strep so far, Gram -ve rod not growing so far, D/W ID, likely Levaquin 750mg  Po for 10-14 more days(?psuedomonas) +  Augmentin (anaerobic bottle still under review+site of infection), outpt Surgery and PCP follow, will request PCP to check final results and check another CBC and BMP in 3 days.       2. DM-2- poor control, adjusted Lantus-ISS-Premeal, add Glucophage from 25th, Insulin education, Case management confirmed pt can afford Lantus+Novolog  Lab Results  Component Value Date   HGBA1C 11.9* 11/12/2011     3. HTN - poor control added Lopressor and Lisinopril added, monitor outpt.    Consults Surgery, DM-educator.   Procedures:  April 22: Irrigation and debridement right thigh    Antibiotics:  April 22: Vancomycin  April 22: Zosyn    Significant Tests:  See full reports for all details     Ct  Pelvis W Contrast  11/11/2011  *RADIOLOGY REPORT*  Clinical Data:  Peritesticular abscesses.  CT PELVIS WITH CONTRAST  Technique:  Multidetector CT imaging of the pelvis was performed using the standard protocol following the bolus administration of intravenous contrast.  Contrast: 132mL OMNIPAQUE IOHEXOL 300 MG/ML  SOLN  Comparison:  None.  Findings:  Small periumbilical hernia contains fat.  Visualized portion of the bowel is unremarkable.  No pathologically enlarged lymph nodes.  No free fluid.  Scattered atherosclerotic calcification of the arterial vasculature.  There is skin thickening and edema involving the medial aspect of the proximal right thigh.  Locules of gas are seen within the skin and just deep to the skin surface.  No organized fluid collection.  Incidental note is made of a lipoma in the anterior right thigh musculature.  No worrisome lytic or sclerotic lesions.  IMPRESSION: Extensive cellulitis involving the medial aspect of the proximal right thigh, with locules of gas, indicating either ulceration or the presence of a gas-forming organism.  No organized fluid collection. These results were called by telephone on 11/11/2011 at  1225 hours to  Abran Richard, who verbally acknowledged these results.  Original Report Authenticated By: Luretha Rued, M.D.     Today   Subjective:   Jeremy [name] today has no headache,no chest abdominal pain,no new weakness tingling or numbness, feels much better wants to go home today.    Objective:   Blood pressure 165/98, pulse 85, temperature 97.4 F (36.3 C), temperature source  Oral, resp. rate 18, height 5\' 9"  (1.753 m), weight 123.1 kg (271 lb 6.2 oz), SpO2 98.00%.  Intake/Output Summary (Last 24 hours) at 11/13/11 1157 Last data filed at 11/13/11 0837  Gross per 24 hour  Intake    480 ml  Output      0 ml  Net    480 ml    Exam Awake Alert, Oriented *3, No new F.N deficits, Normal affect [place].AT,PERRAL Supple Neck,No JVD, No  cervical lymphadenopathy appriciated.  Symmetrical Chest wall movement, Good air movement bilaterally, CTAB RRR,No Gallops,Rubs or new Murmurs, No Parasternal Heave +ve B.Sounds, Abd Soft, Non tender, No organomegaly appriciated, No rebound -guarding or rigidity. No Cyanosis, Clubbing or edema, No new Rash or bruise  Data Review      CBC w Diff: Lab Results  Component Value Date   WBC 10.7* 11/12/2011   HGB 12.1* 11/12/2011   HCT 34.4* 11/12/2011   PLT 325 11/12/2011   LYMPHOPCT 12 11/11/2011   MONOPCT 15* 11/11/2011   EOSPCT 1 11/11/2011   BASOPCT 0 11/11/2011   CMP: Lab Results  Component Value Date   NA 134* 11/12/2011   K 4.2 11/12/2011   CL 99 11/12/2011   CO2 27 11/12/2011   BUN 12 11/12/2011   CREATININE 0.95 11/12/2011  .  Micro Results Recent Results (from the past 240 hour(s))  CULTURE, BLOOD (ROUTINE X 2)     Status: Normal (Preliminary result)   Collection Time   11/11/11 10:00 AM      Component Value Range Status Comment   Specimen Description BLOOD RIGHT ARM   Final    Special Requests BOTTLES DRAWN AEROBIC AND ANAEROBIC 10CC   Final    Culture  Setup Time [id number]   Final    Culture     Final    Value:        BLOOD CULTURE RECEIVED NO GROWTH TO DATE CULTURE WILL BE HELD FOR 5 DAYS BEFORE ISSUING A FINAL NEGATIVE REPORT   Report Status PENDING   Incomplete   CULTURE, BLOOD (ROUTINE X 2)     Status: Normal (Preliminary result)   Collection Time   11/11/11 10:05 AM      Component Value Range Status Comment   Specimen Description BLOOD LEFT ARM   Final    Special Requests BOTTLES DRAWN AEROBIC AND ANAEROBIC 10CC   Final    Culture  Setup Time [id number]   Final    Culture     Final    Value:        BLOOD CULTURE RECEIVED NO GROWTH TO DATE CULTURE WILL BE HELD FOR 5 DAYS BEFORE ISSUING A FINAL NEGATIVE REPORT   Report Status PENDING   Incomplete   CULTURE, ROUTINE-ABSCESS     Status: Normal (Preliminary result)   Collection Time   11/11/11  9:25 PM       Component Value Range Status Comment   Specimen Description ABSCESS PERINEAL   Final    Special Requests PT ON ZOSYN,VANCOMYCIN   Final    Gram Stain     Final    Value: NO WBC SEEN     NO SQUAMOUS EPITHELIAL CELLS SEEN     MODERATE GRAM POSITIVE COCCI IN PAIRS     FEW GRAM NEGATIVE RODS   Culture     Final    Value: ABUNDANT GROUP B STREP(S.AGALACTIAE)ISOLATED     Note: TESTING AGAINST S. AGALACTIAE NOT ROUTINELY PERFORMED DUE TO PREDICTABILITY OF AMP/PEN/VAN SUSCEPTIBILITY.  Report Status PENDING   Incomplete   AFB CULTURE WITH SMEAR     Status: Normal (Preliminary result)   Collection Time   11/11/11  9:25 PM      Component Value Range Status Comment   Specimen Description ABSCESS PERINEAL   Final    Special Requests PT ON ZOSYN,VANC   Final    ACID FAST SMEAR NO ACID FAST BACILLI SEEN   Final    Culture     Final    Value: CULTURE WILL BE EXAMINED FOR 6 WEEKS BEFORE ISSUING A FINAL REPORT   Report Status PENDING   Incomplete   ANAEROBIC CULTURE     Status: Normal (Preliminary result)   Collection Time   11/11/11  9:25 PM      Component Value Range Status Comment   Specimen Description ABSCESS PERINEAL   Final    Special Requests PT ON ZOSYN,VANC   Final    Gram Stain     Final    Value: NO WBC SEEN     NO SQUAMOUS EPITHELIAL CELLS SEEN     MODERATE GRAM POSITIVE COCCI IN PAIRS     FEW GRAM NEGATIVE RODS   Culture     Final    Value: NO ANAEROBES ISOLATED; CULTURE IN PROGRESS FOR 5 DAYS   Report Status PENDING   Incomplete   FUNGUS CULTURE W SMEAR     Status: Normal (Preliminary result)   Collection Time   11/11/11  9:25 PM      Component Value Range Status Comment   Specimen Description ABSCESS PERINEAL   Final    Special Requests PT ON ZOSYN,VANC   Final    Fungal Smear NO YEAST OR FUNGAL ELEMENTS SEEN   Final    Culture CULTURE IN PROGRESS FOR FOUR WEEKS   Final    Report Status PENDING   Incomplete      Discharge Instructions     HH-RN arranged with [facility]- [phone]  Pack wound twice a day with normal saline moistened gauze and dry gauze to cover wound.  Remove packing before taking a shower.  Follow with Primary MD  in 3 days   Get CBC, BMP, all culture results checked in 3 days by Primary MD and again as instructed by your Primary MD.    Accuchecks 4 times/day, Once in AM empty stomach and then before each meal. Log in all results and show them to your Prim.MD in 3 days. If any glucose reading is under 80 or above 300 call your Prim MD immidiately. Follow Low glucose instructions for glucose under 80 as instructed.  Get Medicines reviewed and adjusted.  Please request your Prim.MD to go over all Hospital Tests and Procedure/Radiological results at the follow up, please get all Hospital records sent to your Prim MD by signing hospital release before you go home.   Activity: Fall precautions use [place]/cane & assistance as needed  Diet: Low Carb   For Heart failure patients - Check your Weight same time everyday, if you gain over 2 pounds, or you develop in leg swelling, experience more shortness of breath or chest pain, call your Primary MD immediately. Follow Cardiac Low Salt Diet and 1.8 lit/day fluid restriction.  Disposition Home  If you experience worsening of your admission symptoms, develop shortness of breath, life threatening emergency, suicidal or homicidal thoughts you must seek medical attention immediately by calling 911 or calling your MD immediately  if symptoms less severe.  You  Must read complete instructions/literature along with all the possible adverse reactions/side effects for all the Medicines you take and that have been prescribed to you. Take any new Medicines after you have completely understood and accpet all the possible adverse reactions/side effects.   Do not drive if your were admitted for syncope or siezures until you have seen by Primary MD or a Neurologist and advised to drive.  Do not drive  when taking Pain medications.    Do not take more than prescribed Pain, Sleep and Anxiety Medications  Special Instructions: If you have smoked or chewed Tobacco  in the last 2 yrs please stop smoking, stop any regular Alcohol  and or any Recreational drug use.  Wear Seat belts while driving.  Follow-up Information    Schedule an appointment as soon as possible for a visit with TOTH Joneen Boers, MD. (1-2 weeks)    Contact information:   [facility], [place] [phone]       Follow up with Primary MD Dr Tamera Punt. Schedule an appointment as soon as possible for a visit in 3 days.         Discharge Medications   Medication List  As of 11/13/2011 12:26 PM   START taking these medications         amoxicillin-clavulanate 875-125 MG per tablet   Commonly known as: AUGMENTIN   Take 1 tablet by mouth every 12 (twelve) hours.      glucose monitoring kit monitoring kit   1 each by Does not apply route 4 (four) times daily - after meals and at bedtime. 1 month Diabetic Testing Supplies for QAC-QHS accuchecks.      * insulin aspart 100 UNIT/ML injection   Commonly known as: novoLOG   Inject 3 Units into the skin 3 (three) times daily with meals. Plus the sliding scale insulin with meals      * insulin aspart 100 UNIT/ML injection   Commonly known as: novoLOG   Before each meal 3 times a day, 140-199 - 2 units, 200-250 - 4 units, 250-299 - 6 units, 300-349 - 8 units,  350 or above 10 units.      insulin glargine 100 UNIT/ML injection   Commonly known as: LANTUS   Inject 20 Units into the skin at bedtime.      levofloxacin 750 MG tablet   Commonly known as: LEVAQUIN   Take 1 tablet (750 mg total) by mouth daily.      lisinopril 5 MG tablet   Commonly known as: PRINIVIL,ZESTRIL   Take 1 tablet (5 mg total) by mouth daily.      metFORMIN 850 MG tablet   Commonly known as: GLUCOPHAGE   Take 1 tablet (850 mg  total) by mouth 2 (two) times daily with a meal.      metoprolol 50 MG tablet   Commonly known as: LOPRESSOR   Take 1 tablet (50 mg total) by mouth 2 (two) times daily.      oxyCODONE 5 MG immediate release tablet   Commonly known as: Oxy IR/ROXICODONE   Take 1-2 tablets (5-10 mg total) by mouth every 4 (four) hours as needed.     * Notice: This list has 2 medication(s) that are the same as other medications prescribed for you. Read the directions carefully, and ask your doctor or other care provider to review them with you.        Where to get  your medications    These are the prescriptions that you need to pick up.   You may get these medications from any pharmacy.         amoxicillin-clavulanate 875-125 MG per tablet   glucose monitoring kit monitoring kit   insulin aspart 100 UNIT/ML injection   insulin glargine 100 UNIT/ML injection   levofloxacin 750 MG tablet   lisinopril 5 MG tablet   metFORMIN 850 MG tablet   metoprolol 50 MG tablet   oxyCODONE 5 MG immediate release tablet              Total Time in preparing paper work, data evaluation and todays exam - 35 minutes  Lala Lund K M.D on 11/13/2011 at 11:57 AM  [facility] Office  [phone]

## 2011-11-13 NOTE — Progress Notes (Signed)
Pt iv's d/c intact. Pt prescriptions provided. Pt verbalized understanding of d/c instructions that were provided. Pt under no s/s distress.

## 2011-11-13 NOTE — Progress Notes (Signed)
Inpatient Diabetes Program  Received phone call from MD regarding pt switching from Lantus to 70/30 insulin for discharge, as pt has no insurance.    Results for Jeremy [name], Jeremy [name] (MRN [id number]) as of 11/13/2011 10:48  Ref. Range 11/12/2011 06:51 11/12/2011 11:24 11/12/2011 16:20 11/12/2011 21:44 11/13/2011 06:48  Glucose-Capillary Latest Range: 70-99 mg/dL 300 (H) 261 (H) 269 (H) 319 (H) 252 (H)    Recommend:  70/30 18 units bid and metformin 850 mg bid.  Will need to followup with a PCP to manage his diabetes.

## 2011-11-13 NOTE — Progress Notes (Signed)
   CARE MANAGEMENT NOTE 11/13/2011  Patient:  Jeremy [name], Jeremy [name]   Account Number:  [id number]  Date Initiated:  11/12/2011  Documentation initiated by:  Marvetta Gibbons  Subjective/Objective Assessment:   Pt admitted with thigh abscess, DM     Action/Plan:   PTA pt lived at home with girlfriend, was independent with ADLS,   Anticipated DC Date:  11/14/2011   Anticipated DC Plan:  [place]  CM consult      [facility]  HOME HEALTH   Choice offered to / List presented to:  C-1 Patient        [place] arranged  HH-1 RN      [place].   Status of service:  Completed, signed off Medicare Important Message given?   (If response is "NO", the following Medicare IM given date fields will be blank) Date Medicare IM given:   Date Additional Medicare IM given:    Discharge Disposition:  [place]  Per UR Regulation:    If discussed at Long Length of Stay Meetings, dates discussed:    Comments:  PCP- pt plans to f/u with Dr. Josiah Lobo  11/13/11- 1120-Soriyah Osberg RN, BSN [phone] Pt for discharge today- spoke with pt regarding PCP and medication coverage. Pt called Dr. Josiah Lobo office who has agreed to follow pt as his PCP- pt to go upon discharge to pick up paperwork at MD office. Called [employer] [phone]) and spoke with Ivin Booty who verified that pt does have prescription benefits under his plan. HH has been arranged with [facility]. Pt to d/c home with family.  11/12/11- 1200- Marvetta Gibbons RN, BSN [phone] Spoke with pt at bedside along with girlfriend. Per conversation pt states that he does not have a PCP and has a practice in mind- encouraged pt to go ahead and call practice to see if they are taking new patients and schedule an appointment- if practice is not taking new pt- informed pt he could contact his insurance provider to assist him or this CM could give him Health Connect number. Pt states he will  f/u on finding PCP. Order also for [place] for wound care- list of [facility] agencies provided to pt- pt wants to use [facility] for [facility] agency. Referral sent via [facility] and call made to [facility] wit [facility]. [facility] services will begin 24-48 post discharge. CM will f/u with pt regarding PCP.

## 2011-11-13 NOTE — Plan of Care (Signed)
Problem: Food- and Nutrition-Related Knowledge Deficit (NB-1.1) Goal: Nutrition education Formal process to instruct or train a patient/client in a skill or to impart knowledge to help patients/clients voluntarily manage or modify food choices and eating behavior to maintain or improve health.  Outcome: Completed/Met Date Met:  11/13/11 11/13/11 Consult received for DM education. Pt is 70 in 271 lbs with BMI of 40.1 (Severe Obesity Class III).Per pt he is noncompliant at home with both medications and diet. Pt understands that is why he required hospitalization. Reviewed DM diet education with pt and his wife. Pt states that he is familiar with the diet restrictions but was not following before. Pt is followed at [facility]. Encouraged pt/wife to follow up with RD at that practice after d/c. Handouts with contact information provided. [place], [place]

## 2011-11-14 ENCOUNTER — Ambulatory Visit (INDEPENDENT_AMBULATORY_CARE_PROVIDER_SITE_OTHER): Payer: BC Managed Care – PPO | Admitting: *Deleted

## 2011-11-14 DIAGNOSIS — E119 Type 2 diabetes mellitus without complications: Secondary | ICD-10-CM

## 2011-11-14 NOTE — Progress Notes (Signed)
Patient comes to office needing help  with new glucose meter. He was discharged from hospital yesterday. Has First appointment in our office tomorrow with Dr. Doreene Nest. Meter set and instructions given on checking blood sugars.  He was able to return demonstration. Blood sugar  332 at 2:30 PM. He has not started insulins yet . He ate about 30 minutes ago. He has instructions for Lantus at bedtime and Novolog  regularly before meals and a sliding scale in addition. Consulted with Dr. McDiarmid and he advises  for patient to start Lantus tonight  and then start Novolog per instructions tomorrow.

## 2011-11-15 ENCOUNTER — Ambulatory Visit (INDEPENDENT_AMBULATORY_CARE_PROVIDER_SITE_OTHER): Payer: BC Managed Care – PPO | Admitting: Family Medicine

## 2011-11-15 ENCOUNTER — Encounter: Payer: Self-pay | Admitting: Family Medicine

## 2011-11-15 VITALS — BP 134/86 | HR 92 | Temp 97.7°F | Ht 68.0 in | Wt 267.2 lb

## 2011-11-15 DIAGNOSIS — E1165 Type 2 diabetes mellitus with hyperglycemia: Secondary | ICD-10-CM

## 2011-11-15 DIAGNOSIS — I10 Essential (primary) hypertension: Secondary | ICD-10-CM | POA: Insufficient documentation

## 2011-11-15 DIAGNOSIS — L03119 Cellulitis of unspecified part of limb: Secondary | ICD-10-CM

## 2011-11-15 DIAGNOSIS — IMO0001 Reserved for inherently not codable concepts without codable children: Secondary | ICD-10-CM

## 2011-11-15 DIAGNOSIS — L02419 Cutaneous abscess of limb, unspecified: Secondary | ICD-10-CM

## 2011-11-15 LAB — CULTURE, ROUTINE-ABSCESS

## 2011-11-15 MED ORDER — "INSULIN SYRINGE 29G X 1/2"" 0.5 ML MISC": Status: DC

## 2011-11-15 NOTE — Assessment & Plan Note (Signed)
Will increase Lantus from 20 to 25 as fasting glucose still very elevated.  He is currently doing SSI from hospital.  Gave patient log to keep, will have him return in 1 week so we can continue to adjust his insulin.  He declines Dm education and ophtho referral.

## 2011-11-15 NOTE — Assessment & Plan Note (Signed)
Stable, No changes today.  May need additional agent in future.

## 2011-11-15 NOTE — Assessment & Plan Note (Signed)
Well healing, no signs of worsening infection.  No new info on wound culture.  Advised to conitnue course of antibiotics and follow-up with general surgery.

## 2011-11-15 NOTE — Progress Notes (Signed)
  Subjective:    Patient ID: Jeremy [name], male    DOB: 1966/03/14, 46 y.o.   MRN: [id number]  HPI  Here to establish new patient after hospital follow-up.  Has not seen a doctor in years.  Had I & D of thigh abscess- discharged from hospital. Some drainage, no new pain, redness or fever.  Taking two antibiotics.  Changing packing twice a day. Has not yet scheduled follow-up with surgeon.  DIABETES  Taking and tolerating: Lantus 20 units at bedtime. Novolog 3 units with Fasting blood sugars: this am 279.  Hypoglycemic symptoms: no Visual problems: no Monitoring feet: yes Numbness/Tingling: no Last eye exam: none recently Diabetic Labs:  Lab Results  Component Value Date   HGBA1C 11.9* 11/12/2011   Lab Results  Component Value Date   CREATININE 0.95 11/12/2011   Last microalbumin: No results found for this basename: MICROALBUR, [id number]      Review of Systems Patient Information Form: Screening and ROS  Review of Symptoms  General:  Negative for nexplained weight loss, fever HEENT: Negative for trouble hearing change in voice, dysphagia. CV:  Negative for chest pain, dyspnea, edema, palpitations Resp: Negative for cough, dyspnea, hemoptysis GI: Negative for nausea, vomiting, diarrhea, constipation, abdominal pain, melena, hematochezia. GU: Negative for dysuria, incontinence, urinary hesitance, hematuria  polyuria, sexual difficulty, lumps in testicle or breasts MSK: Negative for muscle cramps or aches, joint pain or swelling Neuro: Negative for headaches, weakness, numbness, dizziness, passing out/fainting Psych: Negative for depression, anxiety, memory problems     + bad vision Objective:   Physical Exam GEN: Alert & Oriented, No acute distress.  Reluctant to engage.  He is concerned about finances. HEENT: [place]/AT. EOMI, PERRLA, no conjunctival injection or scleral icterus.  Bilateral tympanic membranes intact without erythema or effusion.  .  Nares without edema  or rhinorrhea.  Oropharynx is without erythema or exudates.  No anterior or posterior cervical lymphadenopathy. CV:  Regular Rate & Rhythm, no murmur Respiratory:  Normal work of breathing, CTAB Abd:  + BS, soft, no tenderness to palpation Ext: no pre-tibial edema Skin:  Right posterior medial groin with large incision- packed with guaze- inspected- healthy appearing granulation tissue and borders.   Neuro:  right facial droop.        Assessment & Plan:

## 2011-11-15 NOTE — Patient Instructions (Addendum)
Increase Lantus insulin to 25 units  Keep sugar log.  Make appointment with surgeon  Will refer you to eye doctor  Will refer you to diabetes class  Follow-up in 1 weeks  Bring all of your medicines and your sugar log to every office visit

## 2011-11-16 LAB — CBC WITH DIFFERENTIAL/PLATELET
Hemoglobin: 12.5 g/dL — ABNORMAL LOW (ref 13.0–17.0)
Lymphs Abs: 2 10*3/uL (ref 0.7–4.0)
Monocytes Relative: 10 % (ref 3–12)
Neutro Abs: 6.3 10*3/uL (ref 1.7–7.7)
Neutrophils Relative %: 68 % (ref 43–77)
RBC: 4.84 MIL/uL (ref 4.22–5.81)

## 2011-11-16 LAB — BASIC METABOLIC PANEL
CO2: 27 mEq/L (ref 19–32)
Chloride: 104 mEq/L (ref 96–112)
Glucose, Bld: 188 mg/dL — ABNORMAL HIGH (ref 70–99)
Potassium: 4.7 mEq/L (ref 3.5–5.3)
Sodium: 140 mEq/L (ref 135–145)

## 2011-11-17 LAB — CULTURE, BLOOD (ROUTINE X 2)
Culture  Setup Time: 201304221350
Culture  Setup Time: 201304221350
Culture: NO GROWTH
Culture: NO GROWTH

## 2011-11-18 ENCOUNTER — Telehealth (INDEPENDENT_AMBULATORY_CARE_PROVIDER_SITE_OTHER): Payer: Self-pay | Admitting: General Surgery

## 2011-11-18 ENCOUNTER — Telehealth: Payer: Self-pay | Admitting: Family Medicine

## 2011-11-18 DIAGNOSIS — I10 Essential (primary) hypertension: Secondary | ICD-10-CM

## 2011-11-18 LAB — ANAEROBIC CULTURE

## 2011-11-18 NOTE — Telephone Encounter (Signed)
Discussed elevated Cr.  Will recheck in office in 4 days, if continues to be elevates, will decrease dose and consider Renal ultrasound/duplex

## 2011-11-18 NOTE — Assessment & Plan Note (Signed)
Discussed elevated Cr after starting lisinopril.  Will recheck in office in 4 days, if continues to be elevated, will decrease dose and consider Renal ultrasound/duplex

## 2011-11-18 NOTE — Telephone Encounter (Signed)
Called the pt and informed him that I have made him a PO apt for 5/15 at 11:10 and that he should arrive 30 minutes prior to get through registration.

## 2011-11-22 ENCOUNTER — Ambulatory Visit (INDEPENDENT_AMBULATORY_CARE_PROVIDER_SITE_OTHER): Payer: BC Managed Care – PPO | Admitting: Family Medicine

## 2011-11-22 ENCOUNTER — Encounter: Payer: Self-pay | Admitting: Family Medicine

## 2011-11-22 VITALS — BP 142/92 | HR 92 | Temp 97.9°F | Ht 68.0 in | Wt 266.0 lb

## 2011-11-22 DIAGNOSIS — E1165 Type 2 diabetes mellitus with hyperglycemia: Secondary | ICD-10-CM

## 2011-11-22 DIAGNOSIS — L02419 Cutaneous abscess of limb, unspecified: Secondary | ICD-10-CM

## 2011-11-22 DIAGNOSIS — IMO0001 Reserved for inherently not codable concepts without codable children: Secondary | ICD-10-CM

## 2011-11-22 DIAGNOSIS — I10 Essential (primary) hypertension: Secondary | ICD-10-CM

## 2011-11-22 NOTE — Patient Instructions (Addendum)
Drop your sugar log off at the office  In 1 week and I will call you with adjustments  Follow-up with in 1 month  Lantus 28 units each night Increase insulin to 4 units with each meal

## 2011-11-22 NOTE — Progress Notes (Signed)
  Subjective:    Patient ID: Jeremy [name], male    DOB: [birth year], 46 y.o.   MRN: [id number]  HPI  Here for 1 week follow-up of diabetes and wound check  Diabetes: bring in glucose log.  At last visit we increased lantus from 20- 25 units.  Meal coverage 3 units novolog with each meal  Fasting 142-279 Lunch 133-280 Dinner 118-217 Bedtime 162-193  No hypoglycemia.  His girlfriend has been a big help with his care.  Wound:  States home health nurse states it is healing well several days ago.  Continued drainage.  Girlfriend helps him with dressing changes.  Declines me looking at it today.  He would like to go back to work. Feels pain is tolerable.  Has surgery follow-up in 2 weeks.  stil taking antibiotics, reviewed proper use- some poor health literacy causing confusion.   Review of Systems See HPI    Objective:   Physical Exam GEN: Alert & Oriented, No acute distress Here with girlfriend.        Assessment & Plan:

## 2011-11-22 NOTE — Assessment & Plan Note (Addendum)
Once EMR back up, realize needed follow-up Cr.  Possible last value elevated due to new start of lisinopril 5 while in hospital.  He is agreeable to returning on Monday for recheck.  Will get fasting labs at that time

## 2011-11-22 NOTE — Assessment & Plan Note (Signed)
Working with home health, he declined me examining today.  Agreed to write for him to return to work with light duty- no prolonged standing and heavy lifting.  Encouraged to finish antibiotics and follow-up with surgery.

## 2011-11-22 NOTE — Assessment & Plan Note (Addendum)
Did very well in bring his log and checking CBG's.  He inquired about not being on insulin anymore. Discussed that this was not likely and that diabetes is a progressive disease with out goal to slow progression. Not reverse.  Will increase Lantus from 25-28, and increase meal coverage from 3 to 4.  Will drop off glucose log in 1 week and I will call him with further titration.  Follow-up in office in 1 month.   Will have him return for fasting bloodowkr

## 2011-11-25 ENCOUNTER — Other Ambulatory Visit: Payer: BC Managed Care – PPO

## 2011-11-25 DIAGNOSIS — E1165 Type 2 diabetes mellitus with hyperglycemia: Secondary | ICD-10-CM

## 2011-11-25 DIAGNOSIS — L02419 Cutaneous abscess of limb, unspecified: Secondary | ICD-10-CM

## 2011-11-25 LAB — COMPREHENSIVE METABOLIC PANEL
CO2: 27 mEq/L (ref 19–32)
Creat: 1.5 mg/dL — ABNORMAL HIGH (ref 0.50–1.35)
Glucose, Bld: 143 mg/dL — ABNORMAL HIGH (ref 70–99)
Total Bilirubin: 0.2 mg/dL — ABNORMAL LOW (ref 0.3–1.2)
Total Protein: 6.9 g/dL (ref 6.0–8.3)

## 2011-11-25 LAB — LIPID PANEL
Cholesterol: 134 mg/dL (ref 0–200)
Triglycerides: 114 mg/dL (ref <150)
VLDL: 23 mg/dL (ref 0–40)

## 2011-11-25 NOTE — Progress Notes (Signed)
CMP AND FLP DONE TODAY Jeremy [name] 

## 2011-11-26 ENCOUNTER — Encounter: Payer: Self-pay | Admitting: Family Medicine

## 2011-12-03 ENCOUNTER — Telehealth: Payer: Self-pay | Admitting: Family Medicine

## 2011-12-03 MED ORDER — METOPROLOL TARTRATE 50 MG PO TABS: 50.0000 mg | ORAL_TABLET | Freq: Two times a day (BID) | ORAL | Status: DC

## 2011-12-03 MED ORDER — METFORMIN HCL 1000 MG PO TABS: 1000.0000 mg | ORAL_TABLET | Freq: Two times a day (BID) | ORAL | Status: DC

## 2011-12-03 MED ORDER — LISINOPRIL 5 MG PO TABS: 5.0000 mg | ORAL_TABLET | Freq: Every day | ORAL | Status: DC

## 2011-12-03 NOTE — Telephone Encounter (Signed)
Will forward to Dr. Doreene Nest.

## 2011-12-03 NOTE — Telephone Encounter (Signed)
States that the pharmacy will not refill unless Dr Doreene Nest sends in request for his Lisnopril and metoprolol & metformin  [facility]

## 2011-12-04 ENCOUNTER — Encounter (INDEPENDENT_AMBULATORY_CARE_PROVIDER_SITE_OTHER): Payer: Self-pay | Admitting: General Surgery

## 2011-12-04 ENCOUNTER — Telehealth (INDEPENDENT_AMBULATORY_CARE_PROVIDER_SITE_OTHER): Payer: Self-pay | Admitting: General Surgery

## 2011-12-04 ENCOUNTER — Ambulatory Visit (INDEPENDENT_AMBULATORY_CARE_PROVIDER_SITE_OTHER): Payer: BC Managed Care – PPO | Admitting: General Surgery

## 2011-12-04 VITALS — BP 150/98 | HR 110 | Temp 97.6°F | Resp 14 | Ht 68.0 in | Wt 272.2 lb

## 2011-12-04 DIAGNOSIS — L02419 Cutaneous abscess of limb, unspecified: Secondary | ICD-10-CM

## 2011-12-04 DIAGNOSIS — L03119 Cellulitis of unspecified part of limb: Secondary | ICD-10-CM

## 2011-12-04 NOTE — Patient Instructions (Signed)
Shower daily. Continue wound care as you have been doing

## 2011-12-04 NOTE — Telephone Encounter (Signed)
Called pt to let him know his next PO I&D apt to see Dr. Marlou Starks is on 6/3 at 10:00.  He is aware he needs to arrive 15 minutes earlier so he can get through registration.

## 2011-12-04 NOTE — Progress Notes (Signed)
Subjective:     Patient ID: Jeremy [name], male   DOB: Feb 05, 1966, 46 y.o.   MRN: [id number]  HPI The patient is a 46 year old white male who is about 3 weeks out from an incision and drainage of a right posterior thigh abscess. His wife has been doing his wound care and things seem to be healing well. He does have some sensitivity and tenderness around the area but this is improving. He denies any drainage or fevers.  Review of Systems     Objective:   Physical Exam On exam the wound looks fantastic. It is very flat with good granulation tissue. It looks very clean.    Assessment:     3 weeks status post incision and drainage of right posterior thigh abscess    Plan:     At this point I think the wife is taking great care of the wound. They will continue to do the same wound care they've been doing. I think he can get in the shower every day. We will plan to see him back in about 3 weeks to check the progress of the wound.

## 2011-12-06 ENCOUNTER — Telehealth: Payer: Self-pay | Admitting: Family Medicine

## 2011-12-06 DIAGNOSIS — E1165 Type 2 diabetes mellitus with hyperglycemia: Secondary | ICD-10-CM

## 2011-12-06 DIAGNOSIS — IMO0002 Reserved for concepts with insufficient information to code with codable children: Secondary | ICD-10-CM

## 2011-12-06 NOTE — Assessment & Plan Note (Signed)
Reviewed blood glucose log over the phone- 13 days Fasting 101-110 Before lunch 98-159 Before dinner 89-168 Before bedtime 102-197  With overall trend of improvement.

## 2011-12-06 NOTE — Telephone Encounter (Signed)
Discussed with patient great blood sugars, continue on same regimen.  He inquires about switching to orals only, at this time, discussed would not likely control his blood sugars.  He also asks about sometimes- hard to insert needle into skin.  Discussed rotating sites, can use lateral thighs as well.  If continues to have problems will let me know and we will troubleshoot further.  He asks for ophtho referral to day for blurry vision, DM screening.

## 2011-12-09 LAB — FUNGUS CULTURE W SMEAR

## 2011-12-10 ENCOUNTER — Other Ambulatory Visit: Payer: Self-pay | Admitting: Family Medicine

## 2011-12-10 MED ORDER — LISINOPRIL 5 MG PO TABS: 5.0000 mg | ORAL_TABLET | Freq: Every day | ORAL | Status: DC

## 2011-12-12 ENCOUNTER — Encounter: Payer: Self-pay | Admitting: Family Medicine

## 2011-12-12 DIAGNOSIS — H3581 Retinal edema: Secondary | ICD-10-CM | POA: Insufficient documentation

## 2011-12-23 ENCOUNTER — Ambulatory Visit (INDEPENDENT_AMBULATORY_CARE_PROVIDER_SITE_OTHER): Payer: BC Managed Care – PPO | Admitting: General Surgery

## 2011-12-23 ENCOUNTER — Encounter (INDEPENDENT_AMBULATORY_CARE_PROVIDER_SITE_OTHER): Payer: Self-pay | Admitting: General Surgery

## 2011-12-23 VITALS — BP 147/94 | HR 91 | Temp 97.0°F | Ht 68.0 in | Wt 276.8 lb

## 2011-12-23 DIAGNOSIS — L02419 Cutaneous abscess of limb, unspecified: Secondary | ICD-10-CM

## 2011-12-23 NOTE — Progress Notes (Signed)
Subjective:     Patient ID: Jeremy [name], male   DOB: 01-Jun-1966, 46 y.o.   MRN: [id number]  HPI The patient is a 46 year old white male who is a couple months out from an incision and drainage of a right upper inner thigh abscess. His wife has been taking care of the wound and she has been doing fantastic job. He still has some tenderness around the incision. He denies any fevers or drainage from the area.  Review of Systems     Objective:   Physical Exam On exam the wound itself is almost completely healed. There is a small bit of granulation tissue left at the center portion of the incision. There is no sign of infection.    Assessment:     Status post incision and drainage of right thigh abscess    Plan:     Overall he is doing very well. The wife is taking fantastic care the wound. They will continue to keep it clean and dry. We will plan to see him back in about one month to check the wound.

## 2011-12-23 NOTE — Patient Instructions (Signed)
Call medical doctor about swollen lower extremities

## 2011-12-24 ENCOUNTER — Telehealth: Payer: Self-pay | Admitting: Family Medicine

## 2011-12-24 DIAGNOSIS — E119 Type 2 diabetes mellitus without complications: Secondary | ICD-10-CM

## 2011-12-24 LAB — AFB CULTURE WITH SMEAR (NOT AT ARMC)

## 2011-12-24 MED ORDER — ONETOUCH ULTRA SYSTEM W/DEVICE KIT: 1.0000 | PACK | Freq: Once | Status: DC

## 2011-12-24 NOTE — Telephone Encounter (Signed)
Pt took his BS meter to work and he misplaced and and now is asking for another one.  [place]

## 2011-12-24 NOTE — Telephone Encounter (Signed)
RX sent to pharmacy for new  meter.  Patient states he uses One Touch Ultra.  States he has current RX for strips.

## 2011-12-30 ENCOUNTER — Encounter: Payer: Self-pay | Admitting: Family Medicine

## 2011-12-30 ENCOUNTER — Ambulatory Visit (INDEPENDENT_AMBULATORY_CARE_PROVIDER_SITE_OTHER): Payer: BC Managed Care – PPO | Admitting: Family Medicine

## 2011-12-30 VITALS — BP 148/85 | HR 99 | Ht 68.0 in | Wt 277.0 lb

## 2011-12-30 DIAGNOSIS — IMO0001 Reserved for inherently not codable concepts without codable children: Secondary | ICD-10-CM

## 2011-12-30 DIAGNOSIS — E1165 Type 2 diabetes mellitus with hyperglycemia: Secondary | ICD-10-CM

## 2011-12-30 DIAGNOSIS — I10 Essential (primary) hypertension: Secondary | ICD-10-CM

## 2011-12-30 NOTE — Patient Instructions (Signed)
Your a1c is much improved!!!! 8.0% down from 11.9%.  Your goal is 7%  Your goal blood pressure is less than 130/80.  I would like to start another blood pressure medicine.  Work on nutrition and exercise, and let;s follow-up in 3 months for a recheck.  Enjoy your summer!

## 2011-12-30 NOTE — Assessment & Plan Note (Addendum)
Will check Bmet to make sure creatinine is ok, had significant increase after starting lisinopril 5 mg.  Would benefit from additional blood pressure control- patient not agreeable at this time.  Encouraged lifestyle modification, will follow-up in 3 months.

## 2011-12-30 NOTE — Progress Notes (Signed)
  Subjective:    Patient ID: Jeremy [name], male    DOB: 09/12/1965, 46 y.o.   MRN: [id number]  HPI DIABETES  Taking and tolerating: yes; lantus 28 and novlog 6 tid with meals Fasting blood sugars:breakfast 98-163, lunch 82-168, dinner 84-149 Night 103-181 Hypoglycemic symptoms: no Visual problems: yes- is being seen by a retinal specialist Monitoring feet: yes Numbness/Tingling: some Last eye exam:last month Diabetic Labs:  Lab Results  Component Value Date   HGBA1C 8.0 12/30/2011   HGBA1C 11.9* 11/12/2011   Lab Results  Component Value Date   LDLCALC 69 11/25/2011   CREATININE 1.50* 11/25/2011   Last microalbumin: No results found for this basename: MICROALBUR, MALB24HUR    HYPERTENSION  BP Readings from Last 3 Encounters:  12/30/11 148/85  12/23/11 147/94  12/04/11 150/98    Hypertension ROS: taking medications as instructed, no medication side effects noted, no chest pain on exertion, no dyspnea on exertion and no swelling of ankles.       Review of Systems     Objective:   Physical Exam        Assessment & Plan:

## 2011-12-30 NOTE — Assessment & Plan Note (Signed)
Much improved from 11.9% to 8.0%in less than 3 months.  Will cotninue on Lantus 28 + novolog 6 units tid with meals.  He is relcutant and would like to stay on orals only. Discussed that diabetes cannot be cured, and encouraged him that he is doing very well and would not change therapy at this time.

## 2011-12-31 LAB — BASIC METABOLIC PANEL
BUN: 17 mg/dL (ref 6–23)
Chloride: 105 mEq/L (ref 96–112)
Creat: 1.39 mg/dL — ABNORMAL HIGH (ref 0.50–1.35)

## 2012-01-08 ENCOUNTER — Telehealth: Payer: Self-pay | Admitting: Family Medicine

## 2012-01-08 DIAGNOSIS — IMO0001 Reserved for inherently not codable concepts without codable children: Secondary | ICD-10-CM

## 2012-01-08 MED ORDER — "INSULIN SYRINGE 29G X 1/2"" 0.5 ML MISC": Status: DC

## 2012-01-08 MED ORDER — INSULIN ASPART 100 UNIT/ML ~~LOC~~ SOLN: 6.0000 [IU] | Freq: Three times a day (TID) | SUBCUTANEOUS | Status: DC

## 2012-01-08 MED ORDER — INSULIN GLARGINE 100 UNIT/ML ~~LOC~~ SOLN: 28.0000 [IU] | Freq: Every day | SUBCUTANEOUS | Status: DC

## 2012-01-08 NOTE — Telephone Encounter (Signed)
Only has one dose left of his Novalin insulin and needs some called - pharmacy told them that they have to call here to get approval [facility] [address]

## 2012-01-08 NOTE — Telephone Encounter (Signed)
Thank you for filling

## 2012-01-08 NOTE — Telephone Encounter (Signed)
Returned call to patient and spoke with wife.  Patient needs refill of Novolog, Lantus, and syringes.  Has enough to last until tonight, but will be out of meds tomorrow morning.  Medications refilled per Epic and wife informed to have patient check with pharmacy later today.  Nolene Ebbs, RN

## 2012-01-30 ENCOUNTER — Encounter (INDEPENDENT_AMBULATORY_CARE_PROVIDER_SITE_OTHER): Payer: BC Managed Care – PPO | Admitting: General Surgery

## 2012-02-17 ENCOUNTER — Encounter (INDEPENDENT_AMBULATORY_CARE_PROVIDER_SITE_OTHER): Payer: BC Managed Care – PPO | Admitting: General Surgery

## 2012-02-21 ENCOUNTER — Encounter (INDEPENDENT_AMBULATORY_CARE_PROVIDER_SITE_OTHER): Payer: Self-pay | Admitting: General Surgery

## 2012-04-09 ENCOUNTER — Encounter: Payer: BC Managed Care – PPO | Admitting: Family Medicine

## 2012-04-10 NOTE — Progress Notes (Signed)
This encounter was created in error - please disregard.

## 2012-05-25 ENCOUNTER — Other Ambulatory Visit: Payer: Self-pay | Admitting: Family Medicine

## 2012-05-25 DIAGNOSIS — IMO0001 Reserved for inherently not codable concepts without codable children: Secondary | ICD-10-CM

## 2012-05-25 MED ORDER — INSULIN ASPART 100 UNIT/ML ~~LOC~~ SOLN: 6.0000 [IU] | Freq: Three times a day (TID) | SUBCUTANEOUS | Status: DC

## 2012-06-02 ENCOUNTER — Encounter: Payer: Self-pay | Admitting: Family Medicine

## 2012-06-02 ENCOUNTER — Ambulatory Visit (INDEPENDENT_AMBULATORY_CARE_PROVIDER_SITE_OTHER): Payer: BC Managed Care – PPO | Admitting: Family Medicine

## 2012-06-02 VITALS — BP 182/102 | HR 86 | Temp 97.9°F | Ht 68.0 in | Wt 287.5 lb

## 2012-06-02 DIAGNOSIS — IMO0001 Reserved for inherently not codable concepts without codable children: Secondary | ICD-10-CM

## 2012-06-02 DIAGNOSIS — E1169 Type 2 diabetes mellitus with other specified complication: Secondary | ICD-10-CM

## 2012-06-02 DIAGNOSIS — E1165 Type 2 diabetes mellitus with hyperglycemia: Secondary | ICD-10-CM

## 2012-06-02 DIAGNOSIS — I10 Essential (primary) hypertension: Secondary | ICD-10-CM

## 2012-06-02 DIAGNOSIS — N529 Male erectile dysfunction, unspecified: Secondary | ICD-10-CM

## 2012-06-02 DIAGNOSIS — IMO0002 Reserved for concepts with insufficient information to code with codable children: Secondary | ICD-10-CM

## 2012-06-02 MED ORDER — VARDENAFIL HCL 10 MG PO TABS: 10.0000 mg | ORAL_TABLET | Freq: Every day | ORAL | Status: DC | PRN

## 2012-06-02 MED ORDER — INSULIN GLARGINE 100 UNIT/ML ~~LOC~~ SOLN: 30.0000 [IU] | Freq: Every day | SUBCUTANEOUS | Status: DC

## 2012-06-02 NOTE — Assessment & Plan Note (Signed)
Discussed continuing to work on control of HTN and DM.  He would like to try medical management.  Will rx levitra for prn use.  Discussed precautions, risk vs benefit.

## 2012-06-02 NOTE — Assessment & Plan Note (Signed)
Will recheck Cr today- concerned about increasing Cr.    Needs additional BP control, patient very reluctant to start new meds.  Agrees to adding HCTZ to BB and Lisinopril.

## 2012-06-02 NOTE — Patient Instructions (Addendum)
Increase lantus to 30 units every night.  If morning blood sugar still above 100, ok to increase to 32 units nightly  New blood pressure medicine- HCTZ 25 mg- water pill.  Blood pressure goal less than 130/80.    Levitra for erectile dysfunction.  Take 1 hour before sex.  If you notice prolonged erection > 4 hours, seek medical attention.

## 2012-06-02 NOTE — Progress Notes (Signed)
  Subjective:    Patient ID: Jeremy [name], male    DOB: Nov 13, 1965, 46 y.o.   MRN: [id number]  HPI DIABETES  Taking and tolerating: yes Fasting blood sugars:low 100's  Hypoglycemic symptoms: no  Visual problems: no Monitoring feet: yes Numbness/Tingling: no Last eye exam: in care Diabetic Labs:  Lab Results  Component Value Date   HGBA1C 7.5 06/02/2012   HGBA1C 8.0 12/30/2011   HGBA1C 11.9* 11/12/2011   Lab Results  Component Value Date   LDLCALC 69 11/25/2011   CREATININE 1.39* 12/30/2011   Last microalbumin: No results found for this basename: MICROALBUR, MALB24HUR   HYPERTENSION  BP Readings from Last 3 Encounters:  06/02/12 182/102  12/30/11 148/85  12/23/11 147/94    Hypertension ROS: taking medications as instructed, no medication side effects noted, patient does not perform home BP monitoring, no chest pain on exertion, no dyspnea on exertion and no swelling of ankles.   ED:  Discussed that for the past month, has had difficulty keeping an erection.  Has some trouble with desire but overall complaint is ED.  Desires medical treatment.    Review of Systems     Objective:   Physical Exam        Assessment & Plan:

## 2012-06-02 NOTE — Assessment & Plan Note (Signed)
Much improved in past 6 months.  Now close to goal at 7.5%.  Again discussed that this did not mean he could discontinue insulin, but instead that the insulin was working.  Also discussed that no cure for diabetes, but willc ontinue to manage it and that he is doing very well.  Will increase lantus from 28 to 30-32 units.

## 2012-06-03 ENCOUNTER — Encounter: Payer: Self-pay | Admitting: Family Medicine

## 2012-06-03 ENCOUNTER — Telehealth: Payer: Self-pay | Admitting: Family Medicine

## 2012-06-03 LAB — BASIC METABOLIC PANEL
Calcium: 9.8 mg/dL (ref 8.4–10.5)
Glucose, Bld: 112 mg/dL — ABNORMAL HIGH (ref 70–99)
Sodium: 138 mEq/L (ref 135–145)

## 2012-06-03 MED ORDER — HYDROCHLOROTHIAZIDE 25 MG PO TABS: 25.0000 mg | ORAL_TABLET | Freq: Every day | ORAL | Status: DC

## 2012-06-03 NOTE — Telephone Encounter (Signed)
PA  Form placed in MD box for levitra. I called insurance company and was told that Novolog, Humolog nor  Novolin  R is covered by insurance.   There is no PA for this either because insurance just  does not cover it.  Will forward to MD.

## 2012-06-03 NOTE — Telephone Encounter (Signed)
Pharm called to say that they didn't get the script for his HCTZ  Also needed PA for his Novalog - they wouldn't give that to him last week without it costing more.  Also levitra needs a PA - call [employer] [phone]

## 2012-06-03 NOTE — Telephone Encounter (Signed)
Completed PA for Levitra.  I was told that Lantus, Novolin 70/30, novolog, humalog, apidra, insulin regular were not covered when looking specifically.  Rep states she thought large changes has occurred on November 1st.  Was unable to get specific information about formulary.  Please ask patient to contact his insurance and ask for complete formulary making sure specifically list of insulin medications covered so that I can work with him to find the most affordable solution for him.

## 2012-06-03 NOTE — Telephone Encounter (Signed)
Will forward to MD and Jeani Hawking, RN for prior approvals.  Also, I see where it was mentioned starting him in HCTZ but it is not in chart. Cristalle Rohm, Salome Spotted

## 2012-06-03 NOTE — Telephone Encounter (Signed)
Called and spoke with Enid Derry and advised her to tell patient to contact insurance company to ask for formulary  and have faxed here to my attention.   Explained problem with insulin . She  called about  meds  this AM.   Advised HCTZ has been sent into pharmacy and PA has been sent for other medication.

## 2012-06-04 NOTE — Telephone Encounter (Signed)
Formulary received  Placed in MD box.

## 2012-06-04 NOTE — Telephone Encounter (Signed)
Contacted representative from [facility]  and after 1 hour and 45 minutes problem was resolved . Lantus and Novolog insulins have been approved.  Ama notified. Advised if patient wants more than 30 day supply at the time he will need to get thru [facility]  Phone number [phone]

## 2012-07-31 ENCOUNTER — Encounter (HOSPITAL_COMMUNITY): Payer: Self-pay | Admitting: *Deleted

## 2012-07-31 ENCOUNTER — Emergency Department (HOSPITAL_COMMUNITY)
Admission: EM | Admit: 2012-07-31 | Discharge: 2012-07-31 | Disposition: A | Discharge status: Home / Self Care | Payer: BC Managed Care – PPO | Source: Home / Self Care | Attending: Emergency Medicine | Admitting: Emergency Medicine

## 2012-07-31 DIAGNOSIS — J069 Acute upper respiratory infection, unspecified: Secondary | ICD-10-CM

## 2012-07-31 DIAGNOSIS — R059 Cough, unspecified: Secondary | ICD-10-CM

## 2012-07-31 DIAGNOSIS — R05 Cough: Secondary | ICD-10-CM

## 2012-07-31 LAB — POCT URINALYSIS DIP (DEVICE)
Glucose, UA: 500 mg/dL — AB
Ketones, ur: NEGATIVE mg/dL
Leukocytes, UA: NEGATIVE
Protein, ur: 30 mg/dL — AB
Urobilinogen, UA: 0.2 mg/dL (ref 0.0–1.0)

## 2012-07-31 LAB — GLUCOSE, CAPILLARY: Glucose-Capillary: 302 mg/dL — ABNORMAL HIGH (ref 70–99)

## 2012-07-31 MED ORDER — GUAIFENESIN-CODEINE 100-10 MG/5ML PO SYRP: 5.0000 mL | ORAL_SOLUTION | Freq: Three times a day (TID) | ORAL | Status: DC | PRN

## 2012-07-31 MED ORDER — CETIRIZINE HCL 10 MG PO TABS: 10.0000 mg | ORAL_TABLET | Freq: Every day | ORAL | Status: DC

## 2012-07-31 NOTE — ED Notes (Signed)
Pt  Has  Symptoms  Of  Body  Aches   Non  Productive  Cough          Chills   X  1   Week   Pt is  Masked  And  In a  Private  Room     He  Is  Sitting upright on  Exam table      Speaking in  Complete  sentances

## 2012-07-31 NOTE — ED Provider Notes (Signed)
History     CSN: [id number]  Arrival date & time 07/31/12  1408   First MD Initiated Contact with Patient 07/31/12 1537      Chief Complaint  Patient presents with  . Generalized Body Aches    (Consider location/radiation/quality/duration/timing/severity/associated sxs/prior treatment) Patient is a 47 y.o. male presenting with URI. The history is provided by the patient.  URI The primary symptoms include fever and cough. The current episode started 6 to 7 days ago. This is a new problem. The problem has been gradually worsening.  The cough is non-productive and hoarse. It is exacerbated by exposure to smoke.  The onset of the illness is associated with exposure to sick contacts. Symptoms associated with the illness include chills. The following treatments were addressed: Acetaminophen was not tried. A decongestant was not tried. Aspirin was not tried. NSAIDs were not tried. Risk factors for severe complications from URI include diabetes mellitus.    Past Medical History  Diagnosis Date  . Abscess   . Hypertension   . Diabetes mellitus   . Bell's palsy     Past Surgical History  Procedure Date  . Incision and drainage of wound   . I&d extremity 11/11/2011    Procedure: IRRIGATION AND DEBRIDEMENT EXTREMITY;  Surgeon: Merrie Roof, MD;  Location: [facility];  Service: General;  Laterality: Right;  I&D Right Thigh Abscess    Family History  Problem Relation Age of Onset  . Diabetes Mother   . Heart disease Mother 72  . Stroke Neg Hx   . Cancer Neg Hx   . Diabetes Sister   . Diabetes Brother   . Diabetes Brother     History  Substance Use Topics  . Smoking status: Former Research scientist (life sciences)  . Smokeless tobacco: Not on file  . Alcohol Use: No      Review of Systems  Constitutional: Positive for fever and chills.  Respiratory: Positive for cough.   All other systems reviewed and are negative.    Allergies  Review of patient's allergies indicates no known allergies.  Home  Medications   Current Outpatient Rx  Name  Route  Sig  Dispense  Refill  . ONETOUCH ULTRA SYSTEM W/DEVICE KIT   Does not apply   1 kit by Does not apply route once.   1 each   0   . CETIRIZINE HCL 10 MG PO TABS   Oral   Take 1 tablet (10 mg total) by mouth daily.   30 tablet   0   . FREESTYLE SYSTEM KIT   Does not apply   1 each by Does not apply route 4 (four) times daily - after meals and at bedtime. 1 month Diabetic Testing Supplies for QAC-QHS accuchecks.   1 each   1   . GUAIFENESIN-CODEINE 100-10 MG/5ML PO SYRP   Oral   Take 5 mLs by mouth 3 (three) times daily as needed for cough.   120 mL   0   . HYDROCHLOROTHIAZIDE 25 MG PO TABS   Oral   Take 1 tablet (25 mg total) by mouth daily.   30 tablet   11   . INSULIN ASPART 100 UNIT/ML [place] SOLN   Subcutaneous   Inject 6 Units into the skin 3 (three) times daily before meals.   1 vial   2   . INSULIN GLARGINE 100 UNIT/ML [place] SOLN   Subcutaneous   Inject 30-32 Units into the skin at bedtime.   10 mL  2   . INSULIN SYRINGE 29G X 1/2" 0.5 ML MISC      Dispense syringe and needle for isnulin adminsitration 4x per day   200 each   3   . LISINOPRIL 5 MG PO TABS   Oral   Take 1 tablet (5 mg total) by mouth daily.   30 tablet   11   . METFORMIN HCL 1000 MG PO TABS   Oral   Take 1 tablet (1,000 mg total) by mouth 2 (two) times daily with a meal.   60 tablet   2   . METOPROLOL TARTRATE 50 MG PO TABS   Oral   Take 1 tablet (50 mg total) by mouth 2 (two) times daily.   60 tablet   2   . ONETOUCH ULTRA BLUE VI STRP               . [place] LANCETS MISC               . OXYCODONE HCL 5 MG PO TABS               . VARDENAFIL HCL 10 MG PO TABS   Oral   Take 1 tablet (10 mg total) by mouth daily as needed for erectile dysfunction.   10 tablet   1     BP 160/97  Pulse 110  Temp 100.5 F (38.1 C) (Oral)  Resp 22  SpO2 99%  Physical Exam  Nursing note and vitals  reviewed. Constitutional: He is oriented to person, place, and time. Vital signs are normal. He appears well-developed and well-nourished. He is active and cooperative.  HENT:  Head: Normocephalic.  Right Ear: Tympanic membrane and external ear normal.  Left Ear: Tympanic membrane and external ear normal.  Nose: Nose normal. Right sinus exhibits no maxillary sinus tenderness and no frontal sinus tenderness. Left sinus exhibits no maxillary sinus tenderness and no frontal sinus tenderness.  Mouth/Throat: Uvula is midline, oropharynx is clear and moist and mucous membranes are normal.  Eyes: Conjunctivae normal are normal. Pupils are equal, round, and reactive to light. No scleral icterus.  Neck: Trachea normal. Neck supple.  Cardiovascular: Normal rate, regular rhythm, normal heart sounds, intact distal pulses and normal pulses.   Pulmonary/Chest: Effort normal and breath sounds normal.  Abdominal: Soft. Bowel sounds are normal. There is no tenderness. There is no rebound and no CVA tenderness.  Musculoskeletal: Normal range of motion.  Lymphadenopathy:       Head (right side): No submental, no submandibular, no tonsillar, no preauricular, no posterior auricular and no occipital adenopathy present.       Head (left side): No submental, no submandibular, no tonsillar, no preauricular, no posterior auricular and no occipital adenopathy present.    He has no cervical adenopathy.  Neurological: He is alert and oriented to person, place, and time. He has normal strength. No cranial nerve deficit or sensory deficit. GCS eye subscore is 4. GCS verbal subscore is 5. GCS motor subscore is 6.  Skin: Skin is warm, dry and intact.  Psychiatric: He has a normal mood and affect. His speech is normal and behavior is normal. Judgment and thought content normal. Cognition and memory are normal.    ED Course  Procedures (including critical care time)  Labs Reviewed  POCT URINALYSIS DIP (DEVICE) - Abnormal;  Notable for the following:    Glucose, UA 500 (*)     Protein, ur 30 (*)     All other  components within normal limits  GLUCOSE, CAPILLARY - Abnormal; Notable for the following:    Glucose-Capillary 302 (*)     All other components within normal limits   No results found.   1. URI (upper respiratory infection)   2. Cough       MDM  Increase fluid intake, rest.  Antibiotics not indicated.  Begin cough suppressant at bedtime. Antihistamines of your choice (Claritin or Zyrtec).  Tylenol or Motrin for fever/discomfort.  Follow up with PCP if not improving 5 to 7 days.         Awilda Metro, NP 07/31/12 1612

## 2012-07-31 NOTE — ED Provider Notes (Signed)
Medical screening examination/treatment/procedure(s) were performed by a resident physician and as supervising physician I was immediately available for consultation/collaboration.  Philipp Deputy, M.D.   Harden Mo, MD 07/31/12 [phone]

## 2012-08-04 ENCOUNTER — Ambulatory Visit (INDEPENDENT_AMBULATORY_CARE_PROVIDER_SITE_OTHER): Payer: BC Managed Care – PPO | Admitting: Family Medicine

## 2012-08-04 ENCOUNTER — Encounter: Payer: Self-pay | Admitting: Family Medicine

## 2012-08-04 VITALS — BP 170/110 | HR 84 | Temp 98.6°F | Ht 68.0 in | Wt 287.0 lb

## 2012-08-04 DIAGNOSIS — J069 Acute upper respiratory infection, unspecified: Secondary | ICD-10-CM

## 2012-08-04 NOTE — Patient Instructions (Addendum)
It was nice meeting you today.  Please try to stop taking the OTC supplements as your blood pressure is elevated.  Please follow up with your PCP Dr. Doreene Nest.

## 2012-08-04 NOTE — Assessment & Plan Note (Signed)
Resolving.  Patient given letter for return to work.

## 2012-08-04 NOTE — Progress Notes (Signed)
Subjective:     Patient ID: Jeremy [name], male   DOB: September 24, 1965, 47 y.o.   MRN: [id number]  HPI 47 year old gentleman here for follow up regarding recent Urgent care visit where he was diagnosed with URI.  1) URI - Symptoms improving with symptomatic treatment - Cough is slowly resolving - No additional fever, chills - Requesting letter for return to work.  Review of Systems See HPI    Objective:   Physical Exam General: well appearing, NAD Heart: RRR. No murmurs, rubs, or gallops Lungs: CTAB. No rales rhonchi, or wheeze.    Assessment:       Plan:

## 2013-04-23 ENCOUNTER — Ambulatory Visit (INDEPENDENT_AMBULATORY_CARE_PROVIDER_SITE_OTHER): Payer: BC Managed Care – PPO | Admitting: Family Medicine

## 2013-04-23 ENCOUNTER — Encounter: Payer: Self-pay | Admitting: Family Medicine

## 2013-04-23 VITALS — BP 125/78 | HR 96 | Ht 68.0 in | Wt 294.0 lb

## 2013-04-23 DIAGNOSIS — I10 Essential (primary) hypertension: Secondary | ICD-10-CM

## 2013-04-23 DIAGNOSIS — E119 Type 2 diabetes mellitus without complications: Secondary | ICD-10-CM

## 2013-04-23 DIAGNOSIS — G478 Other sleep disorders: Secondary | ICD-10-CM

## 2013-04-23 LAB — COMPREHENSIVE METABOLIC PANEL
ALT: 21 U/L (ref 0–53)
AST: 18 U/L (ref 0–37)
Alkaline Phosphatase: 78 U/L (ref 39–117)
Sodium: 136 mEq/L (ref 135–145)
Total Bilirubin: 0.6 mg/dL (ref 0.3–1.2)
Total Protein: 7.4 g/dL (ref 6.0–8.3)

## 2013-04-23 LAB — POCT GLYCOSYLATED HEMOGLOBIN (HGB A1C): Hemoglobin A1C: 10.1

## 2013-04-23 MED ORDER — METFORMIN HCL ER (OSM) 1000 MG PO TB24: 1000.0000 mg | ORAL_TABLET | Freq: Every day | ORAL | Status: DC

## 2013-04-23 NOTE — Progress Notes (Signed)
[place] and placed order for [place] is required.   Asked Pharmacy to fax [place] this form for completion.

## 2013-04-23 NOTE — Assessment & Plan Note (Signed)
A: uncontrolled due to diet noncompliance and medication noncompliance. P: Stop lantus Continue metformin, change to XL due to GI upset Initiate newer agent (injectable) at pharmacology clinic ([place] vs Victoza to be determined based on insurance coverage) FLP at f/u.  CMP today Foot exam done today

## 2013-04-23 NOTE — Patient Instructions (Addendum)
Jeremy [name],  Thank you for coming in to see me today.  I am looking forward to working with you in getting her diabetes and weight under control.  Continue metformin we are switching to the long acting 1000 mg daily. I will call you if your blood work is suitable to restart lisinopril.  Appointments: #1 clinical pharmacology: Schedule appointment to be with Dr. Valentina Lucks and his team for teaching on new injectable agent.  Next week.  #2 followup with me in one to 2 weeks after you see Dr. Valentina Lucks.   Referrals: I placed a referral for you to get a sleep study to rule out sleep apnea.  Dr. Adrian Blackwater   Diet Recommendations for Diabetes   Starchy (carb) foods include: Bread, rice, pasta, potatoes, corn, crackers, bagels, muffins, all baked goods.   Protein foods include: Meat, fish, poultry, eggs, dairy foods, and beans such as pinto and kidney beans (beans also provide carbohydrate).   1. Eat at least 3 meals and 1-2 snacks per day. Never go more than 4-5 hours while awake without eating.  2. Limit starchy foods to TWO per meal and ONE per snack. ONE portion of a starchy  food is equal to the following:   - ONE slice of bread (or its equivalent, such as half of a hamburger bun).   - 1/2 cup of a "scoopable" starchy food such as potatoes or rice.   - 1 OUNCE (28 grams) of starchy snack foods such as crackers or pretzels (look on label).   - 15 grams of carbohydrate as shown on food label.  3. Both lunch and dinner should include a protein food, a carb food, and vegetables.   - Obtain twice as many veg's as protein or carbohydrate foods for both lunch and dinner.   - Try to keep frozen veg's on hand for a quick vegetable serving.     - Fresh or frozen veg's are best.  4. Breakfast should always include protein.

## 2013-04-23 NOTE — Progress Notes (Signed)
  Subjective:    Patient ID: Chief Perreault, male    DOB: Nov 23, 1965, 47 y.o.   MRN: [id number]  HPI Mr. 47 year old male who presents for a follow visit with his growth and discuss the following  #1 type 2 diabetes: Patient has type 2 diabetes past 10 years. Has been intermittently compliant with insulin and metformin. Has a strong family history of diabetes. Admits to poor diet, "loves sodas" and very minimal exercise.   #2 HTN: no medication x 6 months. Denies CP, SOB, vision changes and LE edema.   #3 Poor sleep: interested in sleep aid. Has gained weight. Snores when "really tired".   Soc Hx: non smoker  Fam Hx: mom, dad, brother and sister with diabetes   Review of Systems As per HPI     Objective: BP 125/78  Pulse 96  Ht 5\' 8"  (1.727 m)  Wt 294 lb (133.358 kg)  BMI 44.71 kg/m2    Physical Exam  Constitutional: He appears well-developed and well-nourished. No distress.  HENT:  Head: Normocephalic and atraumatic.  Eyes:  R ptosis (history of Bell's palsy) R eye   Cardiovascular: Normal rate, regular rhythm and intact distal pulses.  Exam reveals no gallop.   No murmur heard. Pulmonary/Chest: Effort normal and breath sounds normal.   Lab Results  Component Value Date   HGBA1C 10.1 04/23/2013      Assessment & Plan:

## 2013-04-26 NOTE — Assessment & Plan Note (Signed)
A: BP well controlled off medications. P: Restart ACE inhibitor for renal protection once hyperglycemia improves.

## 2013-04-26 NOTE — Assessment & Plan Note (Signed)
A: suspect sleep apnea. P: Referral for sleep study.

## 2013-04-27 ENCOUNTER — Telehealth: Payer: Self-pay | Admitting: Family Medicine

## 2013-04-27 NOTE — Telephone Encounter (Signed)
Called patient. Unable to leave VM due to mailbox not being set up yet.  Would have told patient the following: Blood work reviewed.  Elevated blood sugar over 300. Goal is < 200, non fasting.  Cr up to 1.5 (has been at this level before) improved with improved glycemic control in the past down to 1.0 Plan to restart ACE inhibitor once blood sugars improved.  Results letter sent in lieu of phone message.

## 2013-04-29 ENCOUNTER — Encounter: Payer: Self-pay | Admitting: Pharmacist

## 2013-04-29 ENCOUNTER — Ambulatory Visit (INDEPENDENT_AMBULATORY_CARE_PROVIDER_SITE_OTHER): Payer: BC Managed Care – PPO | Admitting: Pharmacist

## 2013-04-29 ENCOUNTER — Other Ambulatory Visit: Payer: Self-pay | Admitting: Pharmacist

## 2013-04-29 VITALS — Wt 292.5 lb

## 2013-04-29 DIAGNOSIS — E1165 Type 2 diabetes mellitus with hyperglycemia: Secondary | ICD-10-CM

## 2013-04-29 DIAGNOSIS — IMO0001 Reserved for inherently not codable concepts without codable children: Secondary | ICD-10-CM

## 2013-04-29 MED ORDER — INSULIN ASPART 100 UNIT/ML ~~LOC~~ SOLN: 5.0000 [IU] | Freq: Three times a day (TID) | SUBCUTANEOUS | Status: DC

## 2013-04-29 NOTE — Progress Notes (Signed)
S:    Patient arrives in good spirits.    Presents for diabetes follow up and initiation of Victoza.  Patient has taken Metformin, Lantus and Novolog. Patient ran out of Novolog and was instructed to discontinue Lantus during last visit with Dr. Adrian Blackwater, has not been taking either for the past week.   O:  . Lab Results  Component Value Date   HGBA1C 10.1 04/23/2013     Home fasting CBG readings of 314 (patient reported). BG has been high since running out of Novolog and stopping Lantus. Patient reports that his BG readings were between 138 and 168 fasting when he was taking insulin. He reports that his lowest BG was 98 and he never experienced hypoglycemic symptoms or need for correction.   A/P: Diabetes currently poorly controlled.   Denies hypoglycemic events and is able to verbalize appropriate hypoglycemia management plan.  Control is suboptimal due to stopping insulin and dietary habits. Victoza was initiated at 0.6 mg daily for 7 days, then increased to 1.2 mg daily. He was counseled on administration technique of Victoza.  Decreased dose of basal insulin Lantus (insulin glargine) to 20 units at night for 7 days, then 15 units at night. Decreased dose of rapid insulin Novolog (insulin aspart) to 6 units with each meal for 7 days, then 5 units with each meal. Change in dietary habits was reinforced--he was encouraged to cut down on soda and drink more water. Patient reported goal of 2 sodas daily. He was encouraged to eat more vegetables.  Written patient instructions provided.  Follow up in Pharmacist Clinic Visit in 4 weeks.   Total time in face to face counseling 30 minutes.  Patient seen with Cammy Brochure, PharmD Candidate and Vivia Ewing, PharmD Resident, and Dewaine Oats, M.D. Resident.    Novolog samples given: Novolog  #2 vials Lot [id number] Exp 09/19/2014

## 2013-04-29 NOTE — Progress Notes (Signed)
Patient ID: Jeremy [name], male   DOB: [birth year], 47 y.o.   MRN: [id number] Reviewed: Agree with Dr. Graylin Shiver documentation and management.

## 2013-04-29 NOTE — Addendum Note (Signed)
Addended by: Leavy Cella on: 04/29/2013 10:31 AM   Modules accepted: Orders

## 2013-04-29 NOTE — Patient Instructions (Addendum)
Thank you for coming to see [place] today!  Start Victoza 0.6mg  daily for 1 week. Then increase dose to 1.2mg  daily.  Start Novolog 6 units (meal time) with meals and Lantus 20 units daily (nighttime) for 1 week (while on Victoza 0.6mg ).  When increase victoza dose; change Novolog to 5 units with meals and Lantus to 15 units daily.   Please call us if you see several blood sugars below 80.   Try to work on your soda intake!  Try to avoid chemicals that upset your breathing.   Come back and see [place] in 2-4 weeks to see how sugars have been!

## 2013-04-29 NOTE — Assessment & Plan Note (Signed)
Diabetes currently poorly controlled.   Denies hypoglycemic events and is able to verbalize appropriate hypoglycemia management plan.  Control is suboptimal due to stopping insulin and dietary habits. Victoza was initiated at 0.6 mg daily for 7 days, then increased to 1.2 mg daily. He was counseled on administration technique of Victoza.  Decreased dose of basal insulin Lantus (insulin glargine) to 20 units at night for 7 days, then 15 units at night. Decreased dose of rapid insulin Novolog (insulin aspart) to 6 units with each meal for 7 days, then 5 units with each meal. Change in dietary habits was reinforced--he was encouraged to cut down on soda and drink more water. Patient reported goal of 2 sodas daily. He was encouraged to eat more vegetables.  Written patient instructions provided.  Follow up in Pharmacist Clinic Visit in 4 weeks.   Total time in face to face counseling 30 minutes.  Patient seen with Cammy Brochure, PharmD Candidate and Vivia Ewing, PharmD Resident, and Dewaine Oats, M.D. Resident

## 2013-05-14 ENCOUNTER — Ambulatory Visit: Payer: BC Managed Care – PPO | Admitting: Family Medicine

## 2013-05-18 ENCOUNTER — Encounter: Payer: Self-pay | Admitting: Family Medicine

## 2013-05-18 ENCOUNTER — Ambulatory Visit (INDEPENDENT_AMBULATORY_CARE_PROVIDER_SITE_OTHER): Payer: BC Managed Care – PPO | Admitting: Family Medicine

## 2013-05-18 VITALS — BP 170/90 | HR 88 | Temp 98.3°F | Wt 295.0 lb

## 2013-05-18 DIAGNOSIS — IMO0002 Reserved for concepts with insufficient information to code with codable children: Secondary | ICD-10-CM

## 2013-05-18 DIAGNOSIS — G478 Other sleep disorders: Secondary | ICD-10-CM

## 2013-05-18 DIAGNOSIS — E1165 Type 2 diabetes mellitus with hyperglycemia: Secondary | ICD-10-CM

## 2013-05-18 DIAGNOSIS — I10 Essential (primary) hypertension: Secondary | ICD-10-CM

## 2013-05-18 DIAGNOSIS — IMO0001 Reserved for inherently not codable concepts without codable children: Secondary | ICD-10-CM

## 2013-05-18 LAB — TSH: TSH: 1.556 u[IU]/mL (ref 0.350–4.500)

## 2013-05-18 MED ORDER — METFORMIN HCL 1000 MG PO TABS: 1000.0000 mg | ORAL_TABLET | Freq: Two times a day (BID) | ORAL | Status: DC

## 2013-05-18 MED ORDER — LISINOPRIL 10 MG PO TABS: 10.0000 mg | ORAL_TABLET | Freq: Every day | ORAL | Status: DC

## 2013-05-18 MED ORDER — NORTRIPTYLINE HCL 10 MG PO CAPS: 10.0000 mg | ORAL_CAPSULE | Freq: Every day | ORAL | Status: DC

## 2013-05-18 NOTE — Patient Instructions (Addendum)
Jeremy [name],  Thank you for coming in today.   For diabetes:  Continue insulin Restart metformin 1000 mg twice daily.  Set goals: 1. Walk after dinner for 30 minutes  2. Eat vegetables with lunch and dinner.   For blood pressure: Start lisinopril 10 mg daily   For insomnia and stress: Start notryptilline 10 mg nightly  F/u in two weeks  Dr. Adrian Blackwater

## 2013-05-18 NOTE — Progress Notes (Signed)
Subjective:     Patient ID: Jeremy [name], male   DOB: 1966-02-26, 47 y.o.   MRN: [id number]  HPI Type 2 DM: Jeremy [name] is here for follow-up of type 2 DM. Last A1C on 10/3 was 10.1. Has been unable to afford Victoza and does not think this will change. Also unable to afford Metformin so has not been taking it since last visit. Endorses chronic tingling in both lower extremities. He reports that his diet is "not good" and that he has difficulty with sodas, sweets, and junk food particularly in the evenings. He is very stressed at work and says that he feels depressed daily. Denies suicidal ideation. Feels stressed at night when he "thinks about things" and responds by eating.  He rates his motivation at an 8/10 to make a change in his diet and his confidence of success as a 9/10.   Patient is taking lantus and novolog.  Checking CBGs. Did not bring meter today.   HTN: He is concerned that BP is elevated today at 174/104. He endorses frequent headaches. He denies SOB, chest pain, edema. Has not taken medication for HTN in several months. Has mild occipital headache right now.   Sleep: Continues to report insomnia. Has not had sleep study as insurance will not cover. Insurance will not cover sleep study at sleep center.   Review of Systems Per HPI PHQ-9 obtained and documented in doc flowsheet.     Objective:   Physical Exam Physical Exam  Constitutional: Obese man, cooperative, depressed affect, NAD HEENT:  Head: Normocephalic and atraumatic.  Eyes: Ptosis of R eye (hx of Bell's palsy) Cardiovascular: RRR, no murmur appreciated.  Pulmonary/Chest: Clear to auscultation bilaterally Lower extremities: Bilateral pedal pulses present. Sensation intact.  BP 170/90  Pulse 88  Temp(Src) 98.3 F (36.8 C) (Oral)  Wt 295 lb (133.811 kg)  BMI 44.86 kg/m2 General appearance: alert, cooperative, no distress and morbidly obese Lungs: clear to auscultation bilaterally Heart: regular rate and  rhythm, S1, S2 normal, no murmur, click, rub or gallop Extremities: edema trace LE edema b/l      Assessment:   Uncontrolled Type 2 DM and HTN complicated by psychosocial stressors including finances and difficulty at work. Patient motivated to make lifestyle changes.   Plan:  1. DM -Adjust metformin and lantus as patient not taking Victoza -Goal setting re: removing junk food from the home, dietary changes   2. HTN -Restart ACE inhibitor  Attending Addendum  I examined the patient and discussed the assessment and plan with Student Dr. Tiajuana Amass. I have reviewed the note and agree. My additions are in bold print. My assessment and plan is in the problem list section.     Boykin Nearing, [place]

## 2013-05-19 ENCOUNTER — Encounter: Payer: Self-pay | Admitting: Family Medicine

## 2013-05-19 NOTE — Assessment & Plan Note (Signed)
A: declined. BP extremely elevated today P; Start lisinopril 10 mg daily  F/u for BMP and BP recheck in two weeks

## 2013-05-19 NOTE — Assessment & Plan Note (Signed)
A: unchanged. Suspect poor sleep hygiene, element of sleep apnea and depression. Patient's insurance does not covering in office sleep study. Will cover home sleep study  P: Home sleep study nortriptyline 10 mg nightly F/u in two weeks

## 2013-05-19 NOTE — Assessment & Plan Note (Signed)
A: CBG improved today. Patient cannot afford victoza P: lantus  novolog Metformin 100 mg BID Lifestyle modifications. Will come up with patient specific goals sheet. Goals set today: walk after dinner, serving of veggies with lunch and dinner.

## 2013-05-20 ENCOUNTER — Other Ambulatory Visit: Payer: Self-pay | Admitting: Family Medicine

## 2013-05-20 DIAGNOSIS — G478 Other sleep disorders: Secondary | ICD-10-CM

## 2013-05-21 ENCOUNTER — Encounter: Payer: Self-pay | Admitting: Family Medicine

## 2013-05-21 DIAGNOSIS — F321 Major depressive disorder, single episode, moderate: Secondary | ICD-10-CM | POA: Insufficient documentation

## 2013-06-02 ENCOUNTER — Ambulatory Visit (HOSPITAL_BASED_OUTPATIENT_CLINIC_OR_DEPARTMENT_OTHER): Payer: BC Managed Care – PPO | Attending: Family Medicine

## 2013-06-02 ENCOUNTER — Encounter (HOSPITAL_BASED_OUTPATIENT_CLINIC_OR_DEPARTMENT_OTHER): Payer: BC Managed Care – PPO

## 2013-06-08 ENCOUNTER — Ambulatory Visit (INDEPENDENT_AMBULATORY_CARE_PROVIDER_SITE_OTHER): Payer: BC Managed Care – PPO | Admitting: Family Medicine

## 2013-06-08 VITALS — BP 198/110 | HR 96 | Temp 98.7°F | Ht 68.0 in | Wt 302.5 lb

## 2013-06-08 DIAGNOSIS — I10 Essential (primary) hypertension: Secondary | ICD-10-CM

## 2013-06-08 DIAGNOSIS — IMO0001 Reserved for inherently not codable concepts without codable children: Secondary | ICD-10-CM

## 2013-06-08 DIAGNOSIS — Z8669 Personal history of other diseases of the nervous system and sense organs: Secondary | ICD-10-CM

## 2013-06-08 DIAGNOSIS — K089 Disorder of teeth and supporting structures, unspecified: Secondary | ICD-10-CM

## 2013-06-08 DIAGNOSIS — E1165 Type 2 diabetes mellitus with hyperglycemia: Secondary | ICD-10-CM

## 2013-06-08 DIAGNOSIS — K0889 Other specified disorders of teeth and supporting structures: Secondary | ICD-10-CM | POA: Insufficient documentation

## 2013-06-08 LAB — BASIC METABOLIC PANEL
BUN: 15 mg/dL (ref 6–23)
Chloride: 106 mEq/L (ref 96–112)
Glucose, Bld: 105 mg/dL — ABNORMAL HIGH (ref 70–99)
Potassium: 4.4 mEq/L (ref 3.5–5.3)

## 2013-06-08 MED ORDER — AMOXICILLIN 500 MG PO CAPS: 500.0000 mg | ORAL_CAPSULE | Freq: Three times a day (TID) | ORAL | Status: DC

## 2013-06-08 MED ORDER — AMLODIPINE BESYLATE 10 MG PO TABS: 10.0000 mg | ORAL_TABLET | Freq: Every day | ORAL | Status: DC

## 2013-06-08 MED ORDER — ASPIRIN EC 81 MG PO TBEC: 81.0000 mg | DELAYED_RELEASE_TABLET | Freq: Every day | ORAL | Status: DC

## 2013-06-08 MED ORDER — INSULIN ASPART 100 UNIT/ML ~~LOC~~ SOLN: 9.0000 [IU] | Freq: Two times a day (BID) | SUBCUTANEOUS | Status: DC

## 2013-06-08 MED ORDER — ACCU-CHEK SOFT TOUCH LANCETS MISC: Status: DC

## 2013-06-08 MED ORDER — GLUCOSE BLOOD VI STRP: ORAL_STRIP | Status: DC

## 2013-06-08 NOTE — Patient Instructions (Addendum)
Thank you for coming in today.  I will fill out your pre op form and send it to Dr. Lorina Rabon.  ADA website  [email or web address]  For BP: We have work to do.  Continue lisinopril  Start norvasc 10 mg daily I will be in touch with  Bloodwork results.   For DM: Excellent job with blood sugars! Change novolog to 9 U BID except on Sunday add 6 U to cover breakfast.   For toothache  amoxacillin for one week Tylenol for pain Avoid BCs and NSAIDs (motrin, aleve etc) as they raise BP.    See me in two weeks  Dr. Adrian Blackwater

## 2013-06-08 NOTE — Progress Notes (Signed)
  Subjective:    Patient ID: Jeremy [name], male    DOB: [birth year], 47 y.o.   MRN: [id number]  HPI [name] is a 47 year old male who presents for follow visit discuss the following:  #1 hypertension: Patient has not taken his antihypertensives for the last week. He denies headache, vision changes, chest pain, shortness of breath, lower extremity edema. He reports he is not taking his medication because he developed acute toothache.  #2 toothache: Patient with history of poor dentition. He is in the cavities. He's developed a toothache on his left mandibular molar. There is mild facial swelling. Subjective fever at night. No chills. He has tried [facility] powders and ice with improvement in pain. He is not been to a dentist in many years.  #3 type 2 diabetes mellitus: Patient compliant with insulin Lantus 20 units at night. NovoLog 6 units 3 times a day. He checks his blood sugars which range from 88-199 fasting. He reports feeling a little nervous when his blood sugar gets in the 80s. He is not exercising regularly. He has improved his diet.  #4 history of Bell's palsy: Patient has a history of Bell's palsy with persistent deficits including right brow ptosis and right hemifacial spasm. He was recently evaluated by Dr. Isidoro Donning was recommended brow ptosis repair as well as Botox injections. The patient is undecided on Botox injections but is planning to have surgical intervention for the brow ptosis.  Soc hx: patient fired from work today.    Review of Systems As per HPI     Objective:   Physical Exam Initial BP 218/108 BP 198/110  Pulse 96  Temp(Src) 98.7 F (37.1 C) (Oral)  Ht 5\' 8"  (1.727 m)  Wt 302 lb 8 oz (137.213 kg)  BMI 46.01 kg/m2 General appearance: alert, cooperative and no distress Eyes: positive findings: eyelids/periorbital: ptosis on the right Mouth: poor dentition. Partially broken L mandibular molar. No palpable ares of fluctuance.  Lungs: clear to auscultation  bilaterally Heart: regular rate and rhythm, S1, S2 normal, no murmur, click, rub or gallop Extremities: extremities normal, atraumatic, no cyanosis or edema     Chemistry      Component Value Date/Time   NA 142 06/08/2013 1451   K 4.4 06/08/2013 1451   CL 106 06/08/2013 1451   CO2 27 06/08/2013 1451   BUN 15 06/08/2013 1451   CREATININE 1.33 06/08/2013 1451   CREATININE 1.54 04/23/2013 1541      Component Value Date/Time   CALCIUM 9.2 06/08/2013 1451   ALKPHOS 78 04/23/2013 1541   AST 18 04/23/2013 1541   ALT 21 04/23/2013 1541   BILITOT 0.6 04/23/2013 1541     CBG (last 3)   Recent Labs  06/08/13 1353  GLUCAP 114*         Assessment & Plan:

## 2013-06-09 ENCOUNTER — Encounter: Payer: Self-pay | Admitting: Family Medicine

## 2013-06-09 NOTE — Assessment & Plan Note (Signed)
A: pre op eval for ptosis repair with local/sedation anesthesia. Patient is not medically cleared until BP better controlled. P: Filled out preop eval sheet and faxed back to patient's surgeon.  He has f/u with me in two weeks for BP check.

## 2013-06-09 NOTE — Assessment & Plan Note (Signed)
For toothache  amoxacillin for one week Tylenol for pain Avoid BCs and NSAIDs (motrin, aleve etc) as they raise BP.

## 2013-06-09 NOTE — Assessment & Plan Note (Signed)
A: declined 2/2 to noncompliance. P: Restart lisinopril 10 mg daily Add norvasc 10 mg daily  F/u in two weeks for BP check Delaying R ptosis surgery until BP under control

## 2013-06-09 NOTE — Assessment & Plan Note (Addendum)
For DM: Excellent job with blood sugars! Change novolog to 9 U BID except on Sunday add 6 U to cover breakfast.  Started asa 81 mg daily Need high intensity statin, will discuss at next f/u as patient seems a bit overwhelmed with medications at this time.

## 2013-06-22 ENCOUNTER — Telehealth: Payer: Self-pay | Admitting: Family Medicine

## 2013-06-22 ENCOUNTER — Ambulatory Visit: Payer: BC Managed Care – PPO | Admitting: Family Medicine

## 2013-06-22 NOTE — Telephone Encounter (Signed)
Called patient. He is doing well. Rescheduled f/u appt from today to next week due to a work opportunity with a friend. Has been taking BP meds and is looking forward to coming in to have BP rechecked. He is confident that it is down.  He has less stress.

## 2013-06-30 ENCOUNTER — Ambulatory Visit (INDEPENDENT_AMBULATORY_CARE_PROVIDER_SITE_OTHER): Payer: Self-pay | Admitting: Family Medicine

## 2013-06-30 ENCOUNTER — Encounter: Payer: Self-pay | Admitting: Family Medicine

## 2013-06-30 VITALS — BP 157/103 | HR 90 | Ht 68.0 in | Wt 298.0 lb

## 2013-06-30 DIAGNOSIS — K0889 Other specified disorders of teeth and supporting structures: Secondary | ICD-10-CM

## 2013-06-30 DIAGNOSIS — Z8669 Personal history of other diseases of the nervous system and sense organs: Secondary | ICD-10-CM

## 2013-06-30 DIAGNOSIS — IMO0001 Reserved for inherently not codable concepts without codable children: Secondary | ICD-10-CM

## 2013-06-30 DIAGNOSIS — E1165 Type 2 diabetes mellitus with hyperglycemia: Secondary | ICD-10-CM

## 2013-06-30 DIAGNOSIS — K089 Disorder of teeth and supporting structures, unspecified: Secondary | ICD-10-CM

## 2013-06-30 DIAGNOSIS — I10 Essential (primary) hypertension: Secondary | ICD-10-CM

## 2013-06-30 DIAGNOSIS — E119 Type 2 diabetes mellitus without complications: Secondary | ICD-10-CM

## 2013-06-30 LAB — LIPID PANEL
HDL: 44 mg/dL (ref >39)
LDL Cholesterol: 103 mg/dL — ABNORMAL HIGH (ref 0–99)
Triglycerides: 69 mg/dL (ref <150)

## 2013-06-30 MED ORDER — LISINOPRIL 40 MG PO TABS: 40.0000 mg | ORAL_TABLET | Freq: Every day | ORAL | Status: DC

## 2013-06-30 NOTE — Assessment & Plan Note (Signed)
BP improved. Medically cleared for planned surgery No medication changes needed prior to surgery. Patient not on ASA.

## 2013-06-30 NOTE — Patient Instructions (Signed)
Mr. Caruth,  Thank you for coming in today. I am happy with you blood sugar numbers.  Your repeat BP is better. I will contact Dr. Lorina Rabon to let her know that you are cleared for surgery.   For your BP: please increase lisinopril, start with 2 tabs (20 mg in the morning) for this week, then start 4 tabs (40 mg) next week. I have sent a new rx for 40 mg tablets that you can pick up when you run out.    F/u in one month  Dr. Adrian Blackwater

## 2013-06-30 NOTE — Progress Notes (Signed)
   Subjective:    Patient ID: Jeremy [name], male    DOB: 02-14-1966, 47 y.o.   MRN: [id number]  HPI 47 yo M presents for f/u appt:  1. HTN: compliant with lisinopril and norvasc. Denies HA, CP, SOB and LE edema.   2. IDDM2: compliant with insulin Lantus 20 U once daily and novolog 9 U BID most days. Checking CBGs fasting 124-137. Has some over 200 two weeks ago related to heavy eating around thanksgiving. Not exercising.   3. Obesity: no exercise. Decreased portions sometimes. Working outdoors in heating and air conditioning now.    4.h/o bell's palsy: pre op clearance. Patient was not cleared at last visit due to hypertension. Since then he has been compliant with medication. He denies HA, CP, SOB.   5. Poor dentition: completed amoxicillin. No fever. Still with intermittent R sided L lower jaw pain and swelling.   Review of Systems As per HPI     Objective:   Physical Exam BP 157/103  Pulse 90  Ht 5\' 8"  (1.727 m)  Wt 298 lb (135.172 kg)  BMI 45.32 kg/m2 BP Readings from Last 3 Encounters:  06/30/13 157/103  06/08/13 198/110  05/18/13 170/90  General appearance: alert, cooperative, no distress and morbidly obese Throat: poor dentition. no abscess. no jaw line tenderness on bimanual exam.  Lungs: clear to auscultation bilaterally Heart: regular rate and rhythm, S1, S2 normal, no murmur, click, rub or gallop Extremities: extremities normal, atraumatic, no cyanosis or edema Neurologic: Grossly normal     Assessment & Plan:

## 2013-06-30 NOTE — Assessment & Plan Note (Signed)
A: improved. Still persist. No abscess P: encouraged patient to f/u with dentist.

## 2013-06-30 NOTE — Assessment & Plan Note (Signed)
A: improved P: Continue norvasc Increase lisinopril to 40 mg daily over the next week.  F/u U Pr/Cr

## 2013-06-30 NOTE — Assessment & Plan Note (Signed)
A: improved blood sugar readings P: Continue current regimen Encouraged weight loss Lipid panel and U Pr/Cr today

## 2013-07-01 LAB — PROTEIN / CREATININE RATIO, URINE: Total Protein, Urine: 27 mg/dL

## 2013-07-06 ENCOUNTER — Telehealth: Payer: Self-pay | Admitting: Family Medicine

## 2013-07-06 NOTE — Telephone Encounter (Signed)
Patient unable to afford insulin syringes at the moment and is wondering if it was possible to get a few syringes until he is able to afford them.

## 2013-07-06 NOTE — Telephone Encounter (Signed)
Placed #15 syringes up front for pt to pick up.  States that he "normally does not have this problem but with christmas things are a little tight". Avice Funchess, Salome Spotted

## 2013-07-07 ENCOUNTER — Encounter: Payer: Self-pay | Admitting: Family Medicine

## 2013-07-07 MED ORDER — LOVASTATIN 20 MG PO TABS: 40.0000 mg | ORAL_TABLET | Freq: Every day | ORAL | Status: DC

## 2013-07-07 NOTE — Addendum Note (Signed)
Addended by: Boykin Nearing on: 07/07/2013 03:59 PM   Modules accepted: Orders

## 2013-07-20 ENCOUNTER — Emergency Department (HOSPITAL_COMMUNITY)
Admission: EM | Admit: 2013-07-20 | Discharge: 2013-07-21 | Disposition: A | Discharge status: Home / Self Care | Payer: Self-pay | Attending: Emergency Medicine | Admitting: Emergency Medicine

## 2013-07-20 ENCOUNTER — Encounter (HOSPITAL_COMMUNITY): Payer: Self-pay | Admitting: Emergency Medicine

## 2013-07-20 DIAGNOSIS — Z87891 Personal history of nicotine dependence: Secondary | ICD-10-CM | POA: Insufficient documentation

## 2013-07-20 DIAGNOSIS — Z7982 Long term (current) use of aspirin: Secondary | ICD-10-CM | POA: Insufficient documentation

## 2013-07-20 DIAGNOSIS — K029 Dental caries, unspecified: Secondary | ICD-10-CM | POA: Insufficient documentation

## 2013-07-20 DIAGNOSIS — I10 Essential (primary) hypertension: Secondary | ICD-10-CM | POA: Insufficient documentation

## 2013-07-20 DIAGNOSIS — Z8669 Personal history of other diseases of the nervous system and sense organs: Secondary | ICD-10-CM | POA: Insufficient documentation

## 2013-07-20 DIAGNOSIS — Z872 Personal history of diseases of the skin and subcutaneous tissue: Secondary | ICD-10-CM | POA: Insufficient documentation

## 2013-07-20 DIAGNOSIS — E119 Type 2 diabetes mellitus without complications: Secondary | ICD-10-CM | POA: Insufficient documentation

## 2013-07-20 DIAGNOSIS — Z79899 Other long term (current) drug therapy: Secondary | ICD-10-CM | POA: Insufficient documentation

## 2013-07-20 DIAGNOSIS — Z794 Long term (current) use of insulin: Secondary | ICD-10-CM | POA: Insufficient documentation

## 2013-07-20 DIAGNOSIS — K047 Periapical abscess without sinus: Secondary | ICD-10-CM | POA: Insufficient documentation

## 2013-07-20 DIAGNOSIS — H9209 Otalgia, unspecified ear: Secondary | ICD-10-CM | POA: Insufficient documentation

## 2013-07-20 MED ORDER — CLINDAMYCIN PHOSPHATE 600 MG/50ML IV SOLN
600.0000 mg | Freq: Once | INTRAVENOUS | Status: AC
  Administered 2013-07-20: 600 mg via INTRAVENOUS
  Filled 2013-07-20 (×2): qty 50

## 2013-07-20 NOTE — ED Notes (Signed)
Pt complains of dental pain for one month, this morning the left side of his face started to swell, his primary doctor told him that he had a broken tooth

## 2013-07-20 NOTE — ED Provider Notes (Signed)
CSN: [id number]     Arrival date & time 07/20/13  2037 History   First MD Initiated Contact with Patient 07/20/13 2301     Chief Complaint  Patient presents with  . Facial Swelling  . Dental Pain   (Consider location/radiation/quality/duration/timing/severity/associated sxs/prior Treatment) HPI Comments: Patient with history of diabetes presents with complaint of left facial swelling. Patient has had pain in his teeth for the past month but last night the left side of his face started to swell. The swelling worsened today. While he was in the waiting room the patient started having drainage from the area. No fever, N/V/D. Patient states blood sugars have been running in the 120's. The onset of this condition was acute. The course is constant. Aggravating factors: palpation. Alleviating factors: none.    Patient is a 47 y.o. male presenting with tooth pain. The history is provided by the patient.  Dental Pain Associated symptoms: facial swelling   Associated symptoms: no fever, no headaches and no neck pain     Past Medical History  Diagnosis Date  . Abscess   . Hypertension   . Diabetes mellitus   . Bell's palsy    Past Surgical History  Procedure Laterality Date  . Incision and drainage of wound    . I&d extremity  11/11/2011    Procedure: IRRIGATION AND DEBRIDEMENT EXTREMITY;  Surgeon: Merrie Roof, MD;  Location: [facility];  Service: General;  Laterality: Right;  I&D Right Thigh Abscess   Family History  Problem Relation Age of Onset  . Diabetes Mother   . Heart disease Mother 13  . Stroke Neg Hx   . Cancer Neg Hx   . Diabetes Sister   . Diabetes Brother   . Diabetes Brother    History  Substance Use Topics  . Smoking status: Former Research scientist (life sciences)  . Smokeless tobacco: Not on file  . Alcohol Use: No    Review of Systems  Constitutional: Negative for fever.  HENT: Positive for dental problem, ear pain and facial swelling. Negative for sore throat and trouble swallowing.    Respiratory: Negative for shortness of breath and stridor.   Musculoskeletal: Negative for neck pain.  Skin: Negative for color change.  Neurological: Negative for headaches.    Allergies  Review of patient's allergies indicates no known allergies.  Home Medications   Current Outpatient Rx  Name  Route  Sig  Dispense  Refill  . amLODipine (NORVASC) 10 MG tablet   Oral   Take 1 tablet (10 mg total) by mouth daily.   30 tablet   2   . aspirin EC 81 MG tablet   Oral   Take 1 tablet (81 mg total) by mouth daily.   90 tablet   3   . insulin aspart (NOVOLOG) 100 UNIT/ML injection   Subcutaneous   Inject 9 Units into the skin 2 (two) times daily with a meal.   1 vial   11   . insulin glargine (LANTUS) 100 UNIT/ML injection   Subcutaneous   Inject 20 Units into the skin at bedtime.          Marland Kitchen lisinopril (PRINIVIL,ZESTRIL) 40 MG tablet   Oral   Take 1 tablet (40 mg total) by mouth daily.   90 tablet   0   . metFORMIN (GLUCOPHAGE) 1000 MG tablet   Oral   Take 1 tablet (1,000 mg total) by mouth 2 (two) times daily with a meal.   180 tablet  3    BP 178/102  Pulse 126  Temp(Src) 99.9 F (37.7 C) (Oral)  Resp 18  SpO2 98% Physical Exam  Nursing note and vitals reviewed. Constitutional: He appears well-developed and well-nourished.  HENT:  Head: Normocephalic and atraumatic.  Right Ear: Tympanic membrane, external ear and ear canal normal.  Left Ear: Tympanic membrane, external ear and ear canal normal.  Nose: Nose normal.  Mouth/Throat: Uvula is midline, oropharynx is clear and moist and mucous membranes are normal. No trismus in the jaw. Abnormal dentition. Dental caries present. No dental abscesses or uvula swelling. No tonsillar abscesses.  Patient with L mandibular tooth pain and tenderness to palpation in area of 1st premolar. There is active drainage of gray pus from the base of this tooth. There is an approximately 4cm x 2cm periapical abscess palpated  over the mid-left mandible.   Eyes: Pupils are equal, round, and reactive to light.  Neck: Normal range of motion. Neck supple.  No neck swelling or Lugwig's angina  Neurological: He is alert.  Skin: Skin is warm and dry.  Psychiatric: He has a normal mood and affect.    ED Course  Procedures (including critical care time) Labs Review Labs Reviewed  GLUCOSE, CAPILLARY - Abnormal; Notable for the following:    Glucose-Capillary 179 (*)    All other components within normal limits   Imaging Review No results found.  EKG Interpretation   None      11:19 PM Patient seen and examined. Work-up initiated. Medications ordered. D/w Dr. Marnette Burgess.   Vital signs reviewed and are as follows: Filed Vitals:   07/20/13 2048  BP: 178/102  Pulse: 126  Temp: 99.9 F (37.7 C)  Resp: 18   Patient subjectively improved after clinda. Swelling improved. Pt seen by Dr. Marnette Burgess. Will d/c with pain medication. He is to monitor blood sugar closely, f/u with PCP or return in 48 hrs.   Patient counseled on use of narcotic pain medications. Counseled not to combine these medications with others containing tylenol. Urged not to drink alcohol, drive, or perform any other activities that requires focus while taking these medications. The patient verbalizes understanding and agrees with the plan.    MDM   1. Periapical abscess   2. Diabetes    Dental abscess, no indication of Lugwig's angina. Temp high normal, but patient non-toxic. Do not feel admission warranted. He is improved in ED and abscess is draining.    Carlisle Cater, PA-C 07/21/13 [phone]

## 2013-07-21 MED ORDER — OXYCODONE-ACETAMINOPHEN 5-325 MG PO TABS
1.0000 | ORAL_TABLET | Freq: Once | ORAL | Status: AC
  Administered 2013-07-21: 1 via ORAL
  Filled 2013-07-21: qty 1

## 2013-07-21 MED ORDER — NAPROXEN 500 MG PO TABS: 500.0000 mg | ORAL_TABLET | Freq: Two times a day (BID) | ORAL | Status: DC

## 2013-07-21 MED ORDER — CLINDAMYCIN HCL 150 MG PO CAPS: 300.0000 mg | ORAL_CAPSULE | Freq: Four times a day (QID) | ORAL | Status: DC

## 2013-07-21 MED ORDER — OXYCODONE-ACETAMINOPHEN 5-325 MG PO TABS: 1.0000 | ORAL_TABLET | Freq: Four times a day (QID) | ORAL | Status: DC | PRN

## 2013-07-22 NOTE — ED Provider Notes (Signed)
Medical screening examination/treatment/procedure(s) were performed by non-physician practitioner and as supervising physician I was immediately available for consultation/collaboration.    Teressa Lower, MD 07/22/13 1002

## 2013-08-02 ENCOUNTER — Ambulatory Visit: Payer: Self-pay | Admitting: Family Medicine

## 2013-09-29 ENCOUNTER — Ambulatory Visit: Payer: Self-pay | Admitting: Family Medicine

## 2013-10-26 ENCOUNTER — Encounter: Payer: Self-pay | Admitting: Family Medicine

## 2013-10-26 ENCOUNTER — Ambulatory Visit (INDEPENDENT_AMBULATORY_CARE_PROVIDER_SITE_OTHER): Payer: No Typology Code available for payment source | Admitting: Family Medicine

## 2013-10-26 VITALS — BP 182/120 | HR 102 | Temp 98.9°F | Wt 303.0 lb

## 2013-10-26 DIAGNOSIS — I10 Essential (primary) hypertension: Secondary | ICD-10-CM

## 2013-10-26 DIAGNOSIS — M25512 Pain in left shoulder: Secondary | ICD-10-CM

## 2013-10-26 DIAGNOSIS — M25519 Pain in unspecified shoulder: Secondary | ICD-10-CM

## 2013-10-26 DIAGNOSIS — IMO0001 Reserved for inherently not codable concepts without codable children: Secondary | ICD-10-CM

## 2013-10-26 DIAGNOSIS — IMO0002 Reserved for concepts with insufficient information to code with codable children: Secondary | ICD-10-CM

## 2013-10-26 DIAGNOSIS — E1165 Type 2 diabetes mellitus with hyperglycemia: Secondary | ICD-10-CM

## 2013-10-26 HISTORY — DX: Pain in left shoulder: M25.512

## 2013-10-26 LAB — POCT GLYCOSYLATED HEMOGLOBIN (HGB A1C): HEMOGLOBIN A1C: 8.4

## 2013-10-26 MED ORDER — INSULIN SYRINGES (DISPOSABLE) U-100 0.3 ML MISC: 1.0000 | Freq: Three times a day (TID) | Status: DC

## 2013-10-26 MED ORDER — AMITRIPTYLINE HCL 50 MG PO TABS: 50.0000 mg | ORAL_TABLET | Freq: Every day | ORAL | Status: DC

## 2013-10-26 MED ORDER — METHYLPREDNISOLONE ACETATE 80 MG/ML IJ SUSP
80.0000 mg | Freq: Once | INTRAMUSCULAR | Status: AC
Start: 2013-10-26 — End: 2013-10-26
  Administered 2013-10-26: 80 mg via INTRA_ARTICULAR

## 2013-10-26 MED ORDER — LISINOPRIL 40 MG PO TABS: 40.0000 mg | ORAL_TABLET | Freq: Every day | ORAL | Status: DC

## 2013-10-26 MED ORDER — AMLODIPINE BESYLATE 10 MG PO TABS: 10.0000 mg | ORAL_TABLET | Freq: Every day | ORAL | Status: DC

## 2013-10-26 NOTE — Assessment & Plan Note (Signed)
For L shoulder pain:  Most likely Muscle pain with neuropathy.  Possibly joint pain. Shoulder injection done today.  Start elavil 50 mg nightly to help with sleep and nighttime pain.

## 2013-10-26 NOTE — Patient Instructions (Addendum)
Mr. Sine,  Thank you for coming in today.  Your A1c is doing well. Good job. Keep up the same regimen. Work on weight loss by eating well: lean protein, veggies, carbs in moderation.  Wt Readings from Last 3 Encounters:  10/26/13 303 lb (137.44 kg)  06/30/13 298 lb (135.172 kg)  06/08/13 302 lb 8 oz (137.213 kg)  Aim to eat 3-4 times per day to keep blood sugars steady.  I have sent U-100 0.3 ml syringes hopefully it will be easier to draw up insulin.    For high blood pressure: Your were normal on the norvasc and lisinopril when in the ED at the end of December, you are now dangerously high. Please restart medication as soon as possible. Sent in refills. Continue daily ASA to help prevent stroke.  BP Readings from Last 3 Encounters:  10/26/13 182/120  07/20/13 127/74  06/30/13 157/103   For L shoulder pain:  Most likely Muscle pain with neuropathy.  Possibly joint pain. Shoulder injection done today.  Start elavil 50 mg nightly to help with sleep and nighttime pain.  See me back in 2 weeks after restarting BP medication and for diabetic retinopathy screen.  Once BP normalized < 140/90 I will send a letter to your surgeon.   Dr. Adrian Blackwater

## 2013-10-26 NOTE — Assessment & Plan Note (Signed)
For high blood pressure: Your were normal on the norvasc and lisinopril when in the ED at the end of December, you are now dangerously high. Please restart medication as soon as possible. Sent in refills. Continue daily ASA to help prevent stroke.  BP Readings from Last 3 Encounters:  10/26/13 182/120  07/20/13 127/74  06/30/13 157/103

## 2013-10-26 NOTE — Progress Notes (Signed)
   Subjective:    Patient ID: Jeremy [name], male    DOB: 1966-01-29, 48 y.o.   MRN: [id number]  HPI 48 yo M presents for f/u visit:  1. DM2: complaint with medication. Checks CBG. Skips breakfast. Exercise at work only. No tingling or numbness in hands in feet. No CP or SOB. Request smaller syringes.   2. HTN: no medication x one week. Intermittently compliant prior to that. No HA, CP, SOB. Non smoker.   3. L shoulder pain: x 4 months. Superior shoulder pain. Worse at night. Keeps him up. No pain during the day or when working. Pain is sharp and achy. Sometimes radiates down arm. No treatment. Stopped taking BCs at my request due to HTN.   Soc Hx: non smoker  Review of Systems As per HPI     Objective:   Physical Exam BP 182/120  Pulse 102  Temp(Src) 98.9 F (37.2 C) (Oral)  Wt 303 lb (137.44 kg) BP Readings from Last 3 Encounters:  10/26/13 182/120  07/20/13 127/74  06/30/13 157/103  General appearance: alert, cooperative and no distress, moderately obese  Lungs: clear to auscultation bilaterally Heart: regular rate and rhythm, S1, S2 normal, no murmur, click, rub or gallop Extremities: edema trace   Shoulder:Left  Inspection reveals no abnormalities, atrophy or asymmetry. Palpation is normal with no tenderness over AC joint or bicipital groove or sub acromion space. He is tender in superior shoulder.   ROM is decreased in abduction.  Rotator cuff strength weak on left 4/5 throughout. No signs of impingement with negative Neer and Hawkin's tests,  - empty can. Normal scapular function observed. No painful arc and no drop arm sign. No apprehension sign  After obtaining informed consent, site marking, cleaning with iodine and alcohol a steroid injection was performed at L subacromial space using 1% plain Lidocaine and 80 mg of Depo Medrol. This was well tolerated.   Lab Results  Component Value Date   HGBA1C 8.4 10/26/2013    BMP done     Assessment & Plan:

## 2013-10-26 NOTE — Assessment & Plan Note (Signed)
Your A1c is doing well. Good job. Keep up the same regimen. Work on weight loss by eating well: lean protein, veggies, carbs in moderation.  Wt Readings from Last 3 Encounters:  10/26/13 303 lb (137.44 kg)  06/30/13 298 lb (135.172 kg)  06/08/13 302 lb 8 oz (137.213 kg)  Aim to eat 3-4 times per day to keep blood sugars steady.  I have sent U-100 0.3 ml syringes hopefully it will be easier to draw up insulin.

## 2013-10-27 ENCOUNTER — Telehealth: Payer: Self-pay | Admitting: Family Medicine

## 2013-10-27 LAB — BASIC METABOLIC PANEL
BUN: 19 mg/dL (ref 6–23)
CALCIUM: 9.2 mg/dL (ref 8.4–10.5)
CHLORIDE: 108 meq/L (ref 96–112)
CO2: 24 mEq/L (ref 19–32)
CREATININE: 1.55 mg/dL — AB (ref 0.50–1.35)
Glucose, Bld: 98 mg/dL (ref 70–99)
Potassium: 3.9 mEq/L (ref 3.5–5.3)
Sodium: 141 mEq/L (ref 135–145)

## 2013-10-27 NOTE — Telephone Encounter (Signed)
Reviewed. Patient called.  Elevated Cr in the setting of HTN (uncontrolled) and diabetes (improving control).  Restated ACE i and Norvasc.  Will recheck in 3 weeks.

## 2013-11-16 ENCOUNTER — Ambulatory Visit (INDEPENDENT_AMBULATORY_CARE_PROVIDER_SITE_OTHER): Payer: No Typology Code available for payment source | Admitting: Family Medicine

## 2013-11-16 ENCOUNTER — Encounter: Payer: Self-pay | Admitting: Family Medicine

## 2013-11-16 VITALS — BP 189/108 | HR 97 | Ht 68.0 in | Wt 303.0 lb

## 2013-11-16 DIAGNOSIS — I10 Essential (primary) hypertension: Secondary | ICD-10-CM

## 2013-11-16 DIAGNOSIS — IMO0002 Reserved for concepts with insufficient information to code with codable children: Secondary | ICD-10-CM

## 2013-11-16 DIAGNOSIS — E1165 Type 2 diabetes mellitus with hyperglycemia: Secondary | ICD-10-CM

## 2013-11-16 DIAGNOSIS — IMO0001 Reserved for inherently not codable concepts without codable children: Secondary | ICD-10-CM

## 2013-11-16 MED ORDER — HYDROCHLOROTHIAZIDE 25 MG PO TABS: 25.0000 mg | ORAL_TABLET | Freq: Every day | ORAL | Status: DC

## 2013-11-16 MED ORDER — LISINOPRIL 20 MG PO TABS: 40.0000 mg | ORAL_TABLET | Freq: Every day | ORAL | Status: DC

## 2013-11-16 MED ORDER — INSULIN ASPART 100 UNIT/ML ~~LOC~~ SOLN: 9.0000 [IU] | Freq: Two times a day (BID) | SUBCUTANEOUS | Status: DC

## 2013-11-16 NOTE — Patient Instructions (Addendum)
Mr. Bueno,  Thank you for coming in today. I called your pharmacy.  Plan: Lisinopril 40 mg (two, 20 mg tablets) once daily- $8 HCTZ 25 mg once daily- $4 Syringes - $12.58  Beware of hypoglycemia which is blood sugar less than 70 with or without symptoms.  The common symptoms of hypoglycemia are: sweating, pale or dusty skin, excessive fatigue, nausea, jitteriness. If you experience these symptoms please check your blood sugar.  My blood sugar is low  (less than 70). What should I do?  If low 60- 70, with or without symptoms. Do not take insulin or oral medication,  eat or drink carbohydrates right away (juice, sweets, breads, fruit). Recheck blood sugar in 2 hrs. If still low call your doctor. If normal take medication.   If 60-40 without symptoms.  Same as above and call your doctor.   If 60-40 with symptoms. Same as above. If symptoms resolve within 30 minutes of eating or drinking carbohydrates call your doctor. If symptoms persist call 911.   If less than 40 with or without symptoms. Same as above. Do not wait 30 minutes, instead call 911.     Dr. Adrian Blackwater

## 2013-11-16 NOTE — Progress Notes (Signed)
   Subjective:    Patient ID: Caylor Deblasio, male    DOB: [birth year], 48 y.o.   MRN: [place]:[id number] CC: HTN and DM2 f/u  HPI 48 year old male presents for follow visit to discuss the following:  1. CHRONIC HYPERTENSION  Disease Monitoring  Blood pressure range: does not check   Chest pain: no   Dyspnea: no   Claudication: no   Medication compliance: no. Due to cost. Did not call when he could not afford medication.  Medication Side Effects  Lightheadedness: no   Headache: yes   Urinary frequency: no   Edema: no   Impotence: no   Preventitive Healthcare:  Exercise: no   Diet Pattern: eats regular meals  Salt Restriction: no   2. CHRONIC DIABETES  Disease Monitoring  Blood Sugar Ranges: 60-130  Polyuria: no   Visual problems: no   Medication Compliance: yes  Medication Side Effects  Hypoglycemia: yes, down to Noank Exam: done   Foot Exam: done   Diet pattern: fair   Exercise: done   Soc hx: former smoker  Review of Systems As per HPI    Objective:   Physical Exam BP 189/108  Pulse 97  Ht 5\' 8"  (1.727 m)  Wt 303 lb (137.44 kg)  BMI 46.08 kg/m2 General appearance: alert, cooperative, no distress and morbidly obese Lungs: clear to auscultation bilaterally Heart: regular rate and rhythm, S1, S2 normal, no murmur, click, rub or gallop     Assessment & Plan:

## 2013-11-16 NOTE — Assessment & Plan Note (Signed)
A: suspect good control given reported CBG P: Repeat A1c in 01/2014 Titrate down on insulin, starting with lantus for low CBGs Info regarding low CBG management provided

## 2013-11-16 NOTE — Assessment & Plan Note (Signed)
A: continues to be uncontrolled due to med non compliance P: Called patient's pharmacy to get price quote Plan to treat with HCTZ and lisinopril per orders F/u in 3 weeks

## 2013-11-23 ENCOUNTER — Telehealth: Payer: Self-pay | Admitting: Family Medicine

## 2013-11-23 NOTE — Telephone Encounter (Signed)
Attempted to call pt back, but "the number you have dialed is incorrect".  Will await callback from pt.  Please ask   1.  What is the name of the machine he uses to test his CBG (so we can get the correct test strips)  2.  And what new medication is he talking about.  His AVS gave quite a bit of information.  Jeremy [name]

## 2013-11-23 NOTE — Telephone Encounter (Signed)
Needs refill on test strips. He is confused on the new medication. Could they send him the instructions? Please advise

## 2013-11-24 ENCOUNTER — Telehealth: Payer: Self-pay | Admitting: Family Medicine

## 2013-11-24 MED ORDER — GLUCOSE BLOOD VI STRP: 1.0000 | ORAL_STRIP | Freq: Three times a day (TID) | Status: DC

## 2013-11-24 MED ORDER — ADULT BLOOD PRESSURE CUFF LG KIT: 1.0000 | PACK | Freq: Two times a day (BID) | Status: DC

## 2013-11-24 MED ORDER — AMITRIPTYLINE HCL 50 MG PO TABS: 25.0000 mg | ORAL_TABLET | Freq: Every day | ORAL | Status: DC

## 2013-11-24 NOTE — Telephone Encounter (Signed)
Called patient. Spoke briefly. He asked me to call his partner who is at home with his medications. He is taking some medications that were previously prescribed, but stopped bc he failed to pick them up.   In addition to listed medications he is also taking elavil and Norvasc. Feeling dizzy, tired, has HA.   Plan decrease elavil to 25 mg q HS STOP Norvasc.  continue all other meds as prescribed. Most recent med list printed and mailed to patient.

## 2013-11-24 NOTE — Telephone Encounter (Signed)
Pt called back and the number now in EPIC is correct. He was read the message but would still like to speak to Dr. Adrian Blackwater. jw

## 2013-11-24 NOTE — Telephone Encounter (Signed)
Patient calls back, telephone number in EPIC is correct [phone]). Glucometer patient uses is TouchOne. Patient states he does not know which medication is causing the dizziness and HA, it was one of the ones that was recently prescribed on 4/28. He states that knows he is not suppose to take [facility] powders but is having to due to the constant HA. Really would like to speak to Dr. Adrian Blackwater about the new medications before he continues to take them. Please call patient at [phone] and his significant other Sunday Spillers) at [phone].

## 2013-11-24 NOTE — Telephone Encounter (Signed)
Attempted to call patient back. First # not valid. Second # voicemail box not set up yet, unable to leave VM.  1. Order one touch glucometer test strips. Sent to pharmacy 2. Called to discuss medication and HA. Concerned that HA is related to HTN, ordered BP cuff. Patient to get cuff and monitor BP at home if cannot afford check BP at drug store.   Blue team, please attempt to call patient back.  Patient should continue BP medication as untreated hypertension is life threatening. Patient advised to come to clinic for f/u ASAP, dizziness and HA could be from medication, or electrolyte abnormality or elevated BP unable to tell w/o assessment.

## 2013-11-26 ENCOUNTER — Encounter: Payer: Self-pay | Admitting: Family Medicine

## 2013-11-26 ENCOUNTER — Ambulatory Visit (INDEPENDENT_AMBULATORY_CARE_PROVIDER_SITE_OTHER): Payer: No Typology Code available for payment source | Admitting: Family Medicine

## 2013-11-26 ENCOUNTER — Ambulatory Visit
Admission: RE | Admit: 2013-11-26 | Discharge: 2013-11-26 | Disposition: A | Discharge status: Home / Self Care | Payer: No Typology Code available for payment source | Source: Ambulatory Visit | Attending: Family Medicine | Admitting: Family Medicine

## 2013-11-26 VITALS — BP 163/97 | HR 88 | Temp 98.7°F | Wt 295.0 lb

## 2013-11-26 DIAGNOSIS — E1165 Type 2 diabetes mellitus with hyperglycemia: Secondary | ICD-10-CM

## 2013-11-26 DIAGNOSIS — R4781 Slurred speech: Secondary | ICD-10-CM

## 2013-11-26 DIAGNOSIS — R4789 Other speech disturbances: Secondary | ICD-10-CM

## 2013-11-26 DIAGNOSIS — IMO0001 Reserved for inherently not codable concepts without codable children: Secondary | ICD-10-CM

## 2013-11-26 DIAGNOSIS — IMO0002 Reserved for concepts with insufficient information to code with codable children: Secondary | ICD-10-CM

## 2013-11-26 DIAGNOSIS — R269 Unspecified abnormalities of gait and mobility: Secondary | ICD-10-CM | POA: Insufficient documentation

## 2013-11-26 LAB — POCT URINALYSIS DIPSTICK
Bilirubin, UA: NEGATIVE
Glucose, UA: NEGATIVE
KETONES UA: NEGATIVE
Leukocytes, UA: NEGATIVE
Nitrite, UA: NEGATIVE
PH UA: 5.5
RBC UA: NEGATIVE
SPEC GRAV UA: 1.025
Urobilinogen, UA: 0.2

## 2013-11-26 LAB — CBC WITH DIFFERENTIAL/PLATELET
BASOS ABS: 0 10*3/uL (ref 0.0–0.1)
BASOS PCT: 0 % (ref 0–1)
EOS ABS: 0.1 10*3/uL (ref 0.0–0.7)
Eosinophils Relative: 1 % (ref 0–5)
HCT: 40.7 % (ref 39.0–52.0)
Hemoglobin: 13.7 g/dL (ref 13.0–17.0)
Lymphocytes Relative: 35 % (ref 12–46)
Lymphs Abs: 1.8 10*3/uL (ref 0.7–4.0)
MCH: 26.3 pg (ref 26.0–34.0)
MCHC: 33.7 g/dL (ref 30.0–36.0)
MCV: 78.1 fL (ref 78.0–100.0)
MONO ABS: 0.7 10*3/uL (ref 0.1–1.0)
MONOS PCT: 13 % — AB (ref 3–12)
NEUTROS ABS: 2.6 10*3/uL (ref 1.7–7.7)
NEUTROS PCT: 51 % (ref 43–77)
Platelets: 265 10*3/uL (ref 150–400)
RBC: 5.21 MIL/uL (ref 4.22–5.81)
RDW: 15.7 % — AB (ref 11.5–15.5)
WBC: 5.1 10*3/uL (ref 4.0–10.5)

## 2013-11-26 LAB — GLUCOSE, CAPILLARY: GLUCOSE-CAPILLARY: 70 mg/dL (ref 70–99)

## 2013-11-26 LAB — VITAMIN B12: VITAMIN B 12: 412 pg/mL (ref 211–911)

## 2013-11-26 NOTE — Patient Instructions (Signed)
You are getting a head CT. You are getting labs and we will call if anything is NOT normal. Follow up Monday. If symptoms worsen, seek immediate care. Take half of your HCTZ and continue linopril 25mg  daily.  Best,  Hilton Sinclair, MD

## 2013-11-26 NOTE — Progress Notes (Signed)
Patient ID: Jeremy [name], male   DOB: 1965-10-08, 48 y.o.   MRN: [id number] Subjective:   CC: Gait imbalance  HPI:   1. Patient presents with gait imbalance that he describes as "dizziness" though he denies presyncope or feeling like room is spinning. He leans one way or the other when he is walking. This has been going on one week, along with some slurred speech that has been going on about 1 month and right bicep feeling like it is flexing to one side. He denies relationship with time of day or other habits. He denies chest pain, dyspnea, or neurologic changes. He called and was instructed to halve lisinopril. He halved lisinopril and HCTZ. He has also had occipital and bilateral headache (chronic). He has h/o Bells Palsy and denies any changes to facial drooping. Has been staying hydrated and urine does not appear concentrated. Denies fevers, chills, cough, nausea, vomiting, or diarrhea.   Review of Systems - Per HPI.   PMH: Reviewed  Smoking status: Never smoker Denies drugs or etoh use    Objective:  Physical Exam BP 163/97  Pulse 88  Temp(Src) 98.7 F (37.1 C) (Oral)  Wt 295 lb (133.811 kg) VS not orthostatic GEN: NAD, pleasant HEENT: Atraumatic, normocephalic, neck supple, EOMI, PERRL, sclera clear, right sided facial droop with h/o Bells Palsy (no change from this new baseline) CV: RRR, no murmurs, rubs, or gallops PULM: CTAB, normal effort ABD: Soft, nontender, nondistended, obese, no organomegaly SKIN: No rash or cyanosis; warm and well-perfused; left shin 3-4 cm scabbing abrasion EXTR: No lower extremity edema or calf tenderness PSYCH: Mood and affect euthymic, normal rate and volume of speech NEURO: Awake, alert, no focal deficits grossly, normal speech, CN 2-12 tested and mild right facial droop present at rest, lifting eyebrows, puffing cheeks, otherwise intact, normal gait, normal FNF, heel-toe gait normal but elicits subjective "imbalance" sensation    Assessment:    Jeremy [name] is a 48 y.o. male with h/o Bells Palsy, HTN, uncontrolled DM here for gait imbalance.    Plan:     Gait imbalance DDx includes stroke/TIA (unlikely given only symptom is imbalance and slurred speech has been ongoing for 1 month and is not present today, though h/o DM and HTN). Not orthostatic here and UA only trace protein. - B12 today as pt is on metformin - Head CT without contrast arranged for today - Visual acuity 20/30 both eyes, 20/25R and 20/40L - Recent BMET with stable Cr,otherwise WNL - CBG 70 - CBC - Work note provided - F/u 3 days. Pt to focus on hydration and halve lisinopril. - Precepted with Dr Adrian Blackwater who agrees with plan. - At f/u, perform Dix-hallpike.  Follow-up: Follow up in 3 days for f/u of gait imbalance.   Hilton Sinclair, MD [place]

## 2013-11-26 NOTE — Assessment & Plan Note (Signed)
DDx includes stroke/TIA (unlikely given only symptom is imbalance and slurred speech has been ongoing for 1 month and is not present today, though h/o DM and HTN). Not orthostatic here and UA only trace protein. - B12 today as pt is on metformin - Head CT without contrast arranged for today - Visual acuity 20/30 both eyes, 20/25R and 20/40L - Recent BMET with stable Cr,otherwise WNL - CBG 70 - CBC - Work note provided - F/u 3 days. Pt to focus on hydration and halve lisinopril. - Precepted with Dr Adrian Blackwater who agrees with plan. - At f/u, perform Dix-hallpike.

## 2013-11-29 ENCOUNTER — Telehealth: Payer: Self-pay | Admitting: Family Medicine

## 2013-11-29 NOTE — Telephone Encounter (Signed)
Patient calling for CT scan results. Please call back [phone]

## 2013-11-29 NOTE — Telephone Encounter (Signed)
Will forward to MD who ordered this. Brace Welte,CMA

## 2013-11-29 NOTE — Telephone Encounter (Signed)
Called patient at number below to discuss results.  Discussed normal labs and head CT. Pt reports he does not feel any different (no better or worse) and did some work on Saturday. I encouraged him to make f/u appt as we had discussed last week to continue evaluation for his gait imbalance. He voiced understanding.  Hilton Sinclair, MD PGY-2, [place]

## 2013-12-07 ENCOUNTER — Encounter: Payer: Self-pay | Admitting: Family Medicine

## 2013-12-07 ENCOUNTER — Ambulatory Visit (INDEPENDENT_AMBULATORY_CARE_PROVIDER_SITE_OTHER): Payer: No Typology Code available for payment source | Admitting: Family Medicine

## 2013-12-07 VITALS — BP 104/59 | HR 104 | Temp 99.1°F | Ht 68.0 in | Wt 292.0 lb

## 2013-12-07 DIAGNOSIS — I10 Essential (primary) hypertension: Secondary | ICD-10-CM

## 2013-12-07 DIAGNOSIS — Z8669 Personal history of other diseases of the nervous system and sense organs: Secondary | ICD-10-CM

## 2013-12-07 DIAGNOSIS — M67921 Unspecified disorder of synovium and tendon, right upper arm: Secondary | ICD-10-CM

## 2013-12-07 DIAGNOSIS — M6790 Unspecified disorder of synovium and tendon, unspecified site: Secondary | ICD-10-CM

## 2013-12-07 DIAGNOSIS — S46211A Strain of muscle, fascia and tendon of other parts of biceps, right arm, initial encounter: Secondary | ICD-10-CM | POA: Insufficient documentation

## 2013-12-07 DIAGNOSIS — IMO0002 Reserved for concepts with insufficient information to code with codable children: Secondary | ICD-10-CM

## 2013-12-07 DIAGNOSIS — R269 Unspecified abnormalities of gait and mobility: Secondary | ICD-10-CM

## 2013-12-07 DIAGNOSIS — E1165 Type 2 diabetes mellitus with hyperglycemia: Secondary | ICD-10-CM

## 2013-12-07 DIAGNOSIS — M719 Bursopathy, unspecified: Secondary | ICD-10-CM

## 2013-12-07 DIAGNOSIS — R21 Rash and other nonspecific skin eruption: Secondary | ICD-10-CM

## 2013-12-07 DIAGNOSIS — IMO0001 Reserved for inherently not codable concepts without codable children: Secondary | ICD-10-CM

## 2013-12-07 LAB — GLUCOSE, CAPILLARY: Glucose-Capillary: 116 mg/dL — ABNORMAL HIGH (ref 70–99)

## 2013-12-07 MED ORDER — INSULIN GLARGINE 100 UNIT/ML ~~LOC~~ SOLN: 28.0000 [IU] | Freq: Every day | SUBCUTANEOUS | Status: DC

## 2013-12-07 MED ORDER — TRIAMCINOLONE ACETONIDE 0.1 % EX CREA: 1.0000 "application " | TOPICAL_CREAM | Freq: Two times a day (BID) | CUTANEOUS | Status: DC

## 2013-12-07 NOTE — Assessment & Plan Note (Signed)
A: pruritic skin rash looks like contact dermatitis. P: topical steroid

## 2013-12-07 NOTE — Patient Instructions (Signed)
Jeremy [name],  Thank you for coming in today. It is good to see you smiling.  1. R bicep-referral to sports medicine for ultrasound evaluation- they will call you.  2. Skin rash topical steroid: twice daily for 7-10 days. Stop if skin lightens.   3. HTN: continue current regimen we will back off if still low at next evaluation. Go home and drink water. Drink throughout the day. I will get a letter to Dr. Apolinar Junes to schedule your surgery.   4. DM2: great blood sugar in office. Stop novolog Move lantus to once daily in the morning Check fasting blood sugars If normal 90-130 continue lantus 28 U  If low 70-89 decrease lantus to 15 U If high > 130 increase lantus to 35 U unless you took 15 U the day before in which case take 28 U  Call me if consistently low or high morning fasting.   See me in f/u in 3 -4 weeks   Dr. Adrian Blackwater   What is low sugar (hypoglycemia)  and what to do?  Beware of hypoglycemia which is blood sugar less than 70 with or without symptoms.  The common symptoms of hypoglycemia are: sweating, pale or dusty skin, excessive fatigue, nausea, jitteriness. If you experience these symptoms please check your blood sugar.  My blood sugar is low  (less than 70). What should I do?  If low 60- 70, with or without symptoms. Do not take insulin or oral medication,  eat or drink carbohydrates right away (juice, sweets, breads, fruit). Recheck blood sugar in 2 hrs. If still low call your doctor. If normal take medication.   If 60-40 without symptoms.  Same as above and call your doctor.   If 60-40 with symptoms. Same as above. If symptoms resolve within 30 minutes of eating or drinking carbohydrates call your doctor. If symptoms persist call 911.   If less than 40 with or without symptoms. Same as above. Do not wait 30 minutes, instead call 911.

## 2013-12-07 NOTE — Assessment & Plan Note (Addendum)
A: normal CBG in office. Low normal CBGs reported. Poor po intake at times. P: D/c novolog Move lantus to AM with scale per AVS.

## 2013-12-07 NOTE — Progress Notes (Signed)
   Subjective:    Patient ID: Jeremy [name], male    DOB: 18-Jul-1966, 48 y.o.   MRN: [place]:[id number] CC: f/u  HPI 48 yo M presents for f/u visit:  1. DM2: out of strips. Reports cost of strips too high.When he last checked CGGs range was 70-93. Taking novolog 9 U once daily. Taking lantus 28 U q HS. Denies polyuria and polydipsia.   2. HTN: compliant with lisinopril and HCTZ. Denies HA, CP, SOB, LE edema, dizziness, lightheadedness, fatigue.   3. R bicep pain: patient is R handed.  felt a sharp pain about one month ago. Now with bulging bicep and weakness compared to the R. Nontender and no bruising.   4. R skin rash: anterior R forearm. Patient working outdoors.  Rash in pruritic. Rash is not spreading.   Soc Hx non smoker  Review of Systems As per HPI    Objective:   Physical Exam BP 104/59  Pulse 104  Temp(Src) 99.1 F (37.3 C) (Oral)  Ht 5\' 8"  (1.727 m)  Wt 292 lb (132.45 kg)  BMI 44.41 kg/m2 General appearance: alert, cooperative and no distress Lungs: clear to auscultation bilaterally Heart: regular rate and rhythm, S1, S2 normal, no murmur, click, rub or gallop Extremities: extremities normal, atraumatic, no cyanosis or edema R UE: bulging R bicep, non tender, no ecchymoses. Strength and sensation intact. Excoriation and small erythematous papule R antecubital fossa.    CBG 116    Assessment & Plan:

## 2013-12-07 NOTE — Assessment & Plan Note (Signed)
Resolved likely 2/2 to elavil.

## 2013-12-07 NOTE — Assessment & Plan Note (Addendum)
A: well controlled P: continue current regimen F/u in 4 weeks to repeat BP and deescalate therapy if BP low/normal like today  Letter faxed to Dr. Lorina Rabon stating patient cleared for surgery (brow ptosis repair).

## 2013-12-07 NOTE — Assessment & Plan Note (Signed)
A: suspect rupture P: SM ultrasound to evaluate. Referral placed.

## 2013-12-20 ENCOUNTER — Telehealth: Payer: Self-pay | Admitting: Family Medicine

## 2013-12-20 NOTE — Telephone Encounter (Signed)
Patient calls, he states he has not heard anything from Dr. Lorina Rabon at [facility] office to schedule his eye surgery. Dr. Adrian Blackwater documented that letter was fax to Dr. Linton Rump office that cleared him for surgery. Patient would like to check on the status of getting surgery scheduled.

## 2013-12-20 NOTE — Telephone Encounter (Signed)
Jeremy [name], CMA worked with Dr. Adrian Blackwater that day will forward to see if she remembers sending letter. Jeremy [name]

## 2013-12-21 NOTE — Telephone Encounter (Signed)
Spoke to Dr Lorina Rabon nurse @ phone # [phone], she requested a re-fax of clearance for surgery letter [id number].Letter re-faxed @ [phone] and patient informed to aspect a call from Dr Lorina Rabon office once surgery callander is open.Patient voiced understanding.[place]

## 2013-12-27 ENCOUNTER — Ambulatory Visit (INDEPENDENT_AMBULATORY_CARE_PROVIDER_SITE_OTHER): Payer: No Typology Code available for payment source | Admitting: Family Medicine

## 2013-12-27 ENCOUNTER — Encounter: Payer: Self-pay | Admitting: Family Medicine

## 2013-12-27 VITALS — BP 184/102 | Ht 68.0 in | Wt 294.0 lb

## 2013-12-27 DIAGNOSIS — I10 Essential (primary) hypertension: Secondary | ICD-10-CM

## 2013-12-27 DIAGNOSIS — S43499A Other sprain of unspecified shoulder joint, initial encounter: Secondary | ICD-10-CM

## 2013-12-27 DIAGNOSIS — S46819A Strain of other muscles, fascia and tendons at shoulder and upper arm level, unspecified arm, initial encounter: Secondary | ICD-10-CM

## 2013-12-27 DIAGNOSIS — S46211A Strain of muscle, fascia and tendon of other parts of biceps, right arm, initial encounter: Secondary | ICD-10-CM

## 2013-12-28 ENCOUNTER — Encounter: Payer: Self-pay | Admitting: Family Medicine

## 2013-12-28 NOTE — Assessment & Plan Note (Signed)
Noted his area poorly controlled blood pressure. We urged him to followup with his PCP in the next week. He had an appointment a month but I recommended he moved that appt up sooner.

## 2013-12-28 NOTE — Progress Notes (Signed)
Patient ID: Jeremy [name], male   DOB: [birth year], 48 y.o.   MRN: [place]:[id number]  Jeremy [name] - 48 y.o. male MRN [place]:[id number]  Date of birth: August 22, 1965    SUBJECTIVE:     Here for evaluation of right bicep muscle pain. About 2 months ago he noted that he had some bicep pain while lifting something really didn't pay much attention to it. The next day was more sore and he noted a lump in his arm. Since then he is had intermittent pain but has really been able to do most of what he wants to do without taking any thing other than occasional Advil right-hand dominant. ROS:     No swelling in his right arm, no numbness in his hand.  PERTINENT  PMH / PSH FH / / SH:  Past Medical, Surgical, Social, and Family History Reviewed & Updated per EMR.  Pertinent Historical Findings include:  Hypertension is apparently poorly controlled. Obesity  OBJECTIVE: BP 184/102  Ht 5\' 8"  (1.727 m)  Wt 294 lb (133.358 kg)  BMI 44.71 kg/m2  Physical Exam:  Vital signs are reviewed. GENERAL: Well-developed obese male no acute distress EXTREMITY: Right upper arm reveals a bicep deformity but no ecchymosis, no tenderness. When he flexes his elbow there is asymmetry between his right and left bicep. He still has quite good strength in elbow flexion that is symmetrical on my clinical exam to his left side. Elbow extension is also normal. Distally he is neurovascularly intact in the right upper extremity.  ASSESSMENT & PLAN:  See problem based charting & AVS for pt instructions.

## 2013-12-28 NOTE — Assessment & Plan Note (Signed)
I discussed this with him and his wife who was present at the office visit. In his age group we is a do not repair of these. Specifically for his exam, he has almost 100% intact strength on clinical exam so I do not think repair would be appropriate. He seemed a little disappointed with this. Did recommend he we have the remaining portion of his upper arm muscles for his best benefit and followup when necessary.

## 2014-01-04 ENCOUNTER — Encounter: Payer: Self-pay | Admitting: Family Medicine

## 2014-01-04 ENCOUNTER — Ambulatory Visit (INDEPENDENT_AMBULATORY_CARE_PROVIDER_SITE_OTHER): Payer: No Typology Code available for payment source | Admitting: Family Medicine

## 2014-01-04 VITALS — BP 150/98 | HR 90 | Temp 98.8°F | Ht 68.0 in | Wt 298.0 lb

## 2014-01-04 DIAGNOSIS — M25519 Pain in unspecified shoulder: Secondary | ICD-10-CM

## 2014-01-04 DIAGNOSIS — I10 Essential (primary) hypertension: Secondary | ICD-10-CM

## 2014-01-04 DIAGNOSIS — M25512 Pain in left shoulder: Secondary | ICD-10-CM

## 2014-01-04 MED ORDER — AMLODIPINE BESYLATE 10 MG PO TABS: 10.0000 mg | ORAL_TABLET | Freq: Every day | ORAL | Status: DC

## 2014-01-04 NOTE — Progress Notes (Signed)
   Subjective:    Patient ID: Jeremy [name], male    DOB: [birth year], 48 y.o.   MRN: [place]:[id number] CC: f/u HTN  HPI 48 yo M presents for f/u visit:  1. HTN: patient complaint with medications. Denies HA, CP, SOB, pain. Was seen at [facility] for biceps tendon rupture, no surgery recommend, BP elevated at that time.    Soc hx: non smoker   Review of Systems As per HPI     Objective:   Physical Exam BP 150/98  Pulse 90  Temp(Src) 98.8 F (37.1 C) (Oral)  Ht 5\' 8"  (1.727 m)  Wt 298 lb (135.172 kg)  BMI 45.32 kg/m2 BP Readings from Last 3 Encounters:  01/04/14 150/98  12/27/13 184/102  12/07/13 104/59  General appearance: alert, cooperative and no distress Lungs: clear to auscultation bilaterally Heart: regular rate and rhythm, S1, S2 normal, no murmur, click, rub or gallop Extremities: extremities normal, atraumatic, no cyanosis or edema    Assessment & Plan:

## 2014-01-04 NOTE — Assessment & Plan Note (Addendum)
A: BP above goal x two visit. P: Restart Norvasc 10 mg daily  continue lisinopril and HCTZ  F/u BP in 3-4 weeks

## 2014-01-04 NOTE — Patient Instructions (Signed)
Mr. Cappell,  Thank you for coming in today. Your are still cleared from my standpoint for eye surgery.  BP above goal of < 140/90 Restart norvasc 10 mg daily.  F/u in 3 weeks for repeat BP check and meet new MD. I have very much enjoyed participating in your care and getting to know you.   Dr. Adrian Blackwater

## 2014-01-10 ENCOUNTER — Encounter: Payer: Self-pay | Admitting: Family Medicine

## 2014-01-31 ENCOUNTER — Encounter: Payer: Self-pay | Admitting: Family Medicine

## 2014-01-31 ENCOUNTER — Ambulatory Visit (INDEPENDENT_AMBULATORY_CARE_PROVIDER_SITE_OTHER): Payer: No Typology Code available for payment source | Admitting: Family Medicine

## 2014-01-31 VITALS — BP 140/90 | HR 91 | Temp 97.6°F | Ht 68.0 in | Wt 295.0 lb

## 2014-01-31 DIAGNOSIS — IMO0002 Reserved for concepts with insufficient information to code with codable children: Secondary | ICD-10-CM

## 2014-01-31 DIAGNOSIS — I10 Essential (primary) hypertension: Secondary | ICD-10-CM

## 2014-01-31 DIAGNOSIS — R109 Unspecified abdominal pain: Secondary | ICD-10-CM

## 2014-01-31 DIAGNOSIS — E1165 Type 2 diabetes mellitus with hyperglycemia: Secondary | ICD-10-CM

## 2014-01-31 DIAGNOSIS — IMO0001 Reserved for inherently not codable concepts without codable children: Secondary | ICD-10-CM

## 2014-01-31 LAB — BASIC METABOLIC PANEL
BUN: 36 mg/dL — ABNORMAL HIGH (ref 6–23)
CALCIUM: 9.5 mg/dL (ref 8.4–10.5)
CO2: 25 mEq/L (ref 19–32)
Chloride: 101 mEq/L (ref 96–112)
Creat: 1.79 mg/dL — ABNORMAL HIGH (ref 0.50–1.35)
GLUCOSE: 211 mg/dL — AB (ref 70–99)
POTASSIUM: 5 meq/L (ref 3.5–5.3)
SODIUM: 132 meq/L — AB (ref 135–145)

## 2014-01-31 LAB — POCT GLYCOSYLATED HEMOGLOBIN (HGB A1C): HEMOGLOBIN A1C: 10

## 2014-01-31 NOTE — Assessment & Plan Note (Signed)
Patient presents for evaluation of right side pain. Suspect MSK etiology. No rash to suggest Herpes Zoster. Symptoms not consistent with urologic/GI issue. Pulmonary examination wnl. -will attempt trial of NSAIDS and heat/ice.

## 2014-01-31 NOTE — Assessment & Plan Note (Addendum)
Blood pressure improved from previously. Still 140/90.  -no changes to therapy today, monitor blood pressure at subsequent visits -check BMP to monitor Cr

## 2014-01-31 NOTE — Patient Instructions (Addendum)
Right side pain - suspect due to muscle ache/spasm, attempt trial of Ibuprofen as needed for pain and heat/ice to the affected area  Diabetes - STOP Novolog, Take Lantus 28 units nightly, continue Metformin twice daily  Hypertension - continue home regimen, take home blood pressure and bring with you at the next visit  Check lab work today  Follow up with Dr. Ree Kida in 2 weeks. Will discuss diabetes and shortness of breath in more detail.

## 2014-01-31 NOTE — Assessment & Plan Note (Signed)
Patient presents for follow up of diabetes. Patient having difficulty understanding insulin regimen. Due for repeat A1C. -check A1C -simplify insulin regimen (continue metformin 1000 mg BID, Lantus 28 units nightly, stop Lantus sliding scale, hold Novolog) -follow up in 2 weeks for diabetes -will need to discuss exercise/diet in more detail at subsequent visits.

## 2014-01-31 NOTE — Progress Notes (Signed)
   Subjective:    Patient ID: Jeremy [name], male    DOB: Sep 17, 1965, 48 y.o.   MRN: [id number]  HPI 48 y/o male presents for evaluation of right side pain.  Right side pain - has been present for 3 weeks, no pain today, in low back and right flank, radiates under right chest area, no radiation to groin, describes as aching when resting, worse with sitting, improved with movement, has not attempted otc pain medications, has not attempted heat/ice, no injury, no rash, no bruising  Hyptertension - on Norvasc, HCTZ, and Lisinopril, reports compliance, no chest pain  Diabetes - currently on lantus (sliding scale based on fasting glucose), Novolog based on blood sugars (per last PCP note Novolog has been discontinued) and Metformin 1000 mg BID, reports difficulty understanding insulin regimen  Social: works for a [employer], girlfriend Sunday Spillers is present, former smoker   Review of Systems  Constitutional: Negative for fever, chills and fatigue.  Respiratory: Negative for cough.   Cardiovascular: Negative for chest pain.  Gastrointestinal: Negative for nausea, vomiting, diarrhea and constipation.  Skin: Negative for rash.   Intermittent sob with exertion, has been longstanding and stable, no sob at rest No dysuria, no blood in urine,    Objective:   Physical Exam Vitals: reviewed Gen: pleasant male, NAD, obese Cardiac: RRR, S1 and S2 present, no murmurs, no heaves/thrills Resp: CTAB, normal effort Abd: soft, non-distended, no tenderness, normal bowel sounds MSK: no flank tenderness Skin: no rash Ext: trace edema in lower extremities     Assessment & Plan:  Please see problem specific assessment and plan.

## 2014-02-01 ENCOUNTER — Telehealth: Payer: Self-pay | Admitting: Family Medicine

## 2014-02-01 NOTE — Telephone Encounter (Signed)
Discussed lab work. Hemoglobin A1C elevated to 10.0. Patient not able to check blood sugars as no money for test strips, no symptoms of hypoglycemia, patient gets paid in 3 days and will pick up strips then. We also discussed elevated CR, told patient to not use ibuprofen as this can worsen kidney function, has not had any more right side pain.   Patient has scheduled follow up in 2 weeks, will discuss diabetic regimen again at this visit.

## 2014-02-07 ENCOUNTER — Telehealth: Payer: Self-pay | Admitting: *Deleted

## 2014-02-07 NOTE — Telephone Encounter (Signed)
Attempted to call patient with no answer. Will call again tomorrow.

## 2014-02-07 NOTE — Telephone Encounter (Signed)
Jeremy [name], [place] from [place] left voice message stating she did a health assessment today and pt's blood pressure was 190/110.  [employer] policy to call and notify PCP.  Derl Barrow, RN

## 2014-02-09 NOTE — Telephone Encounter (Signed)
Contacted patient. Has not check BP since PA visit, no CP/headache/vision changes/numbness/weakness today. He did have a headache the day that his BP was check by Joie Bimler which he thinks may have been contributing to his increased BP, patient to check BP tonight and tomorrow morning and call results to [facility], told patient that if BP really high (>180/100) that he should be seen in the ED tonight, discussed warning signs of hight blood pressure

## 2014-02-14 ENCOUNTER — Ambulatory Visit (INDEPENDENT_AMBULATORY_CARE_PROVIDER_SITE_OTHER): Payer: No Typology Code available for payment source | Admitting: Family Medicine

## 2014-02-14 ENCOUNTER — Encounter: Payer: Self-pay | Admitting: Family Medicine

## 2014-02-14 VITALS — BP 150/102 | HR 91 | Temp 98.0°F | Wt 303.0 lb

## 2014-02-14 DIAGNOSIS — R799 Abnormal finding of blood chemistry, unspecified: Secondary | ICD-10-CM

## 2014-02-14 DIAGNOSIS — N184 Chronic kidney disease, stage 4 (severe): Secondary | ICD-10-CM | POA: Insufficient documentation

## 2014-02-14 DIAGNOSIS — I10 Essential (primary) hypertension: Secondary | ICD-10-CM

## 2014-02-14 DIAGNOSIS — R7989 Other specified abnormal findings of blood chemistry: Secondary | ICD-10-CM

## 2014-02-14 DIAGNOSIS — E1165 Type 2 diabetes mellitus with hyperglycemia: Secondary | ICD-10-CM

## 2014-02-14 DIAGNOSIS — E118 Type 2 diabetes mellitus with unspecified complications: Principal | ICD-10-CM

## 2014-02-14 DIAGNOSIS — N289 Disorder of kidney and ureter, unspecified: Secondary | ICD-10-CM

## 2014-02-14 DIAGNOSIS — I129 Hypertensive chronic kidney disease with stage 1 through stage 4 chronic kidney disease, or unspecified chronic kidney disease: Secondary | ICD-10-CM | POA: Insufficient documentation

## 2014-02-14 DIAGNOSIS — IMO0002 Reserved for concepts with insufficient information to code with codable children: Secondary | ICD-10-CM

## 2014-02-14 DIAGNOSIS — IMO0001 Reserved for inherently not codable concepts without codable children: Secondary | ICD-10-CM

## 2014-02-14 HISTORY — DX: Disorder of kidney and ureter, unspecified: N28.9

## 2014-02-14 LAB — BASIC METABOLIC PANEL
BUN: 23 mg/dL (ref 6–23)
CO2: 27 mEq/L (ref 19–32)
CREATININE: 1.54 mg/dL — AB (ref 0.50–1.35)
Calcium: 9.3 mg/dL (ref 8.4–10.5)
Chloride: 101 mEq/L (ref 96–112)
Glucose, Bld: 239 mg/dL — ABNORMAL HIGH (ref 70–99)
POTASSIUM: 4.8 meq/L (ref 3.5–5.3)
Sodium: 135 mEq/L (ref 135–145)

## 2014-02-14 NOTE — Patient Instructions (Addendum)
Diabetes - Please start an exercise regimen, I encourage you to walk for 30 minutes a day at least 3-4 days per week, I congratulate you on choosing to cut down on the amount of soda that you drink daily, a referral has been placed for you to be seen by opthalmology (eye doctor), please purchase test strips and check blood glucoses twice daily (once in the morning before breakfast and once daily before dinner). We may need to add more insulin to your regimen in the coming weeks.   Elevated Creatinine (bad kidney function) - hold metformin until Dr. Ree Kida calls you with your lab results  Elevated blood pressure - eliminate salt from your diet, recheck BP in two weeks, if elevated again will likely need to start another blood pressure medication  Return to office in 2 weeks for recheck of blood pressure

## 2014-02-14 NOTE — Progress Notes (Signed)
Subjective:     Patient ID: Jeremy [name], male   DOB: [birth year], 48 y.o.   MRN: [id number]  HPI  Jeremy [name] is a 48 yo male that presents today for a follow-up visit concerning his HTN and DM Type II.   Hypertension:  He is still currently taking Norvasc, HCTZ, and Lisinopril for his hypertension. He does check his BP at home, but he forgot to bring his chart with him today. He reports his BP usually runs around 150/80. He thinks his BP is elevated today because he ate pork last night and had extra salt on his french fries. He does not want to start another BP medication today because he is already struggling to pay for his other medications. He really thinks that he will be able to work on his diet/exercise to bring his BP readings down. He denies CP, SOB, palpitations, nausea, vomiting, dizziness, or leg swelling.   Diabetes mellitus:  He is still currently taking Metformin 1000 mg 2x daily and Lantus 28 units at night for his diabetes. He does not check his blood sugars at home because the test strips are too expensive. He denies any symptoms of hypoglycemia, and he thinks that his sugars have been running high. He reports nocturia, intermittent tingling in both hands, and intermittent blurred vision. He is unsure when his last eye exam was. He denies ulcers or skin break down on his feet.   Diet/Exercise:  He was upset about his weight this visit. He reports he is starting a new diet today. He plans on baking his food rather than frying it. He also plans to eat more salads and to cut back on the number of sodas he is drinking daily. Currently, he is drinking 2-3 sodas/day. 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 He believes this new job will require him to walk up and down stairs quite frequently. He also plans to start walking a mile every other evening with his girlfriend. He seems very motivated to start these lifestyle changes.   Review of  Systems No fevers, chills. As per HPI.  Objective:   Physical Exam Filed Vitals:   02/14/14 0905  BP: 150/102  Pulse:   Temp:    General: Well-appearing. NAD.  Heart: RRR. No murmurs, rubs, or gallops.  Lungs: CTA. No wheezes. No crackles.  Abdomen: Obese. Soft. Non-tender.  Extremities: No LE edema.    Assessment:  Mr. Marcheschi is a 48 yo male that presents today for a follow-up visit regarding his HTN, DM Type II, and obesity who is interested in trying a new diet/exercise plan before starting any new medications today.   Plan:  See problem list.   Maudie Mercury, MS3 02/15/2014

## 2014-02-14 NOTE — Assessment & Plan Note (Addendum)
Patient's hemoglobin A1c was 10.0 two weeks ago. His creatinine was also elevated at 1.79.  -Will recheck creatinine this visit. Told patient to stop taking the Metformin until the creatinine level comes back. If creatinine level remains elevated >1.5, we will stop metformin.  -Counseled patient on the importance of checking his blood sugars at home and to go to [facility] to try and get cheaper glucose monitor and test strips. Discussed with patient he should be checking his blood sugars at least twice daily, once in the morning and once before dinner.  -Will continue the Lantus 28 units at night.  -Patient will work on implementing his new diet and exercise plan  -Will refer patient to opthalmology for a diabetic eye exam  -If sugars are still elevated at the next visit, will consider adding 5 units of Novolog before breakfast, lunch, and dinner   Maudie Mercury, MS3  02/14/2014   Agree with above plan. Dossie Arbour MD

## 2014-02-14 NOTE — Assessment & Plan Note (Addendum)
Patient's BP was 150/102 today. Patient believes his BP is elevated today because he ate pork for dinner last night and had extra salt on his french fries. When he checks his BP at home, he reports it is usually in the 150s/80s. He does not want to add a new BP medicine at this time because he is struggling to pay for his other medications. He would like to try modifying his diet and exercise before adding another medication.  -Will give the patient 2 weeks to work on his diet/excercise. Will see patient back in the office in 2 weeks. If his BP is still elevated, we will start Coreg 2x daily at that time. The patient is already on the max dose of Lisinopril, HCTZ, and amlodipine.   Maudie Mercury, MS3 02/14/2014   Agree with above plan Dossie Arbour MD

## 2014-02-14 NOTE — Assessment & Plan Note (Addendum)
Discussed with the patient the importance of lifestyle modifications. He plans to start a new diet today. He plans to bake more of his food rather than frying his food. He also plans to eat more salads, and decrease the number of sodas he is drinking per day. He is currently drinking 2-3 sodas/day. 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 He believes he will be walking up and down stairs more often. He also plans to start walking one mile every other evening with his girlfriend.  Patient appears very motivated to start these new lifestyle modifications because he was upset about his weight today (303 lbs.).  -Will see patient back in 2 weeks to f/u on his new diet/exercise plan  -Will consider adding Coreg and Novolog next visit if lifestyle modifications are not effective   Maudie Mercury, MS3 02/14/2014   Agree with above plan. Dossie Arbour MD

## 2014-02-14 NOTE — Assessment & Plan Note (Signed)
Cr elevated to 1.7. Likely due to uncontrolled diabetes and HTN -stop Metformin today (restart if Cr drops below 1.5) -recheck BMP today

## 2014-02-14 NOTE — Progress Notes (Signed)
   Subjective:    Patient ID: Jeremy [name], male    DOB: [birth year], 49 y.o.   MRN: [id number]  HPI 48 y/o male presents for follow up of diabetes and HTN. I have reviewed and collaborated the history as documented by Maudie Mercury MS3.  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  Respiratory: Negative for chest tightness and shortness of breath.   Cardiovascular: Positive for leg swelling. Negative for chest pain.  Gastrointestinal: Negative for nausea, vomiting and diarrhea.       Objective:   Physical Exam Vitals: reviewed Gen: pleasant male, NAD, overweight HEENT: normocephalic, PERRL, EOMI, MMM, neck supple Cardiac:RRR, S1 and S2 present, no murmurs, no heaves/thrills Resp: CTAB, normal effort Ext: trace pedal edema     Assessment & Plan:  Please see problem specific assessment and plan.   I have reviewed the note completed by Maudie Mercury MS3 and agree with her documentation.

## 2014-02-15 ENCOUNTER — Encounter: Payer: Self-pay | Admitting: *Deleted

## 2014-02-16 ENCOUNTER — Telehealth: Payer: Self-pay | Admitting: Family Medicine

## 2014-02-16 NOTE — Telephone Encounter (Signed)
Discussed BMP results, Cr improved but still too high to restart Metformin, discussed adding back Novolog however patient did not think that he could afford this, he was very hesitant to make changes to insulin at this time, he has been walking and has not drank soda over the past 2 days, may need to consider 70/30 insulin at next visit if Insulin is too expensive

## 2014-02-28 ENCOUNTER — Ambulatory Visit: Payer: No Typology Code available for payment source | Admitting: Family Medicine

## 2014-03-24 ENCOUNTER — Telehealth: Payer: Self-pay | Admitting: Family Medicine

## 2014-03-24 MED ORDER — HYDROCHLOROTHIAZIDE 25 MG PO TABS: 25.0000 mg | ORAL_TABLET | Freq: Every day | ORAL | Status: DC

## 2014-03-24 NOTE — Telephone Encounter (Signed)
Note to nurse - please call patient to let him know that medication has been sent in, thanks

## 2014-03-24 NOTE — Telephone Encounter (Signed)
Pt called and needs a enough of his Hydrochlorothiazide called in to last until his appointment on 9/14. jw

## 2014-03-24 NOTE — Telephone Encounter (Signed)
Patient informed. 

## 2014-03-25 ENCOUNTER — Other Ambulatory Visit: Payer: Self-pay | Admitting: *Deleted

## 2014-03-30 ENCOUNTER — Other Ambulatory Visit: Payer: Self-pay | Admitting: *Deleted

## 2014-04-04 ENCOUNTER — Ambulatory Visit: Payer: No Typology Code available for payment source | Admitting: Family Medicine

## 2014-06-24 ENCOUNTER — Telehealth: Payer: Self-pay | Admitting: Family Medicine

## 2014-06-24 NOTE — Telephone Encounter (Signed)
Note to nursing staff - patient needs appointment to follow up on blood pressure and diabetes, please call to schedule

## 2014-06-24 NOTE — Telephone Encounter (Signed)
Pt scheduled to see dr. Wendi Snipes next week on December 9th @ 4. Pt informed. Clark Cuff C

## 2014-06-29 ENCOUNTER — Ambulatory Visit: Payer: No Typology Code available for payment source | Admitting: Family Medicine

## 2014-10-07 ENCOUNTER — Telehealth: Payer: Self-pay | Admitting: Family Medicine

## 2014-10-07 NOTE — Telephone Encounter (Signed)
Spoke with patient, he states his insurance hasnt kicked in yet but he will call and schedule an appointment when it does.

## 2014-10-07 NOTE — Telephone Encounter (Signed)
Note to nursing staff - please schedule appointment for DM and HTN follow up, thanks

## 2016-02-09 ENCOUNTER — Ambulatory Visit (INDEPENDENT_AMBULATORY_CARE_PROVIDER_SITE_OTHER): Payer: Commercial Managed Care - HMO | Admitting: Family Medicine

## 2016-02-09 ENCOUNTER — Encounter: Payer: Self-pay | Admitting: Family Medicine

## 2016-02-09 VITALS — BP 225/125 | HR 88 | Temp 98.4°F | Ht 68.0 in | Wt 288.4 lb

## 2016-02-09 DIAGNOSIS — Z0000 Encounter for general adult medical examination without abnormal findings: Secondary | ICD-10-CM | POA: Insufficient documentation

## 2016-02-09 DIAGNOSIS — I10 Essential (primary) hypertension: Secondary | ICD-10-CM

## 2016-02-09 DIAGNOSIS — IMO0001 Reserved for inherently not codable concepts without codable children: Secondary | ICD-10-CM

## 2016-02-09 DIAGNOSIS — E1165 Type 2 diabetes mellitus with hyperglycemia: Secondary | ICD-10-CM

## 2016-02-09 DIAGNOSIS — H539 Unspecified visual disturbance: Secondary | ICD-10-CM | POA: Insufficient documentation

## 2016-02-09 LAB — CBC WITH DIFFERENTIAL/PLATELET
Basophils Absolute: 0 cells/uL (ref 0–200)
Basophils Relative: 0 %
EOS ABS: 106 {cells}/uL (ref 15–500)
Eosinophils Relative: 2 %
HCT: 41 % (ref 38.5–50.0)
HEMOGLOBIN: 13.5 g/dL (ref 13.2–17.1)
LYMPHS ABS: 1272 {cells}/uL (ref 850–3900)
Lymphocytes Relative: 24 %
MCH: 25.6 pg — ABNORMAL LOW (ref 27.0–33.0)
MCHC: 32.9 g/dL (ref 32.0–36.0)
MCV: 77.8 fL — ABNORMAL LOW (ref 80.0–100.0)
MPV: 10.6 fL (ref 7.5–12.5)
Monocytes Absolute: 795 cells/uL (ref 200–950)
Monocytes Relative: 15 %
NEUTROS ABS: 3127 {cells}/uL (ref 1500–7800)
Neutrophils Relative %: 59 %
Platelets: 276 10*3/uL (ref 140–400)
RBC: 5.27 MIL/uL (ref 4.20–5.80)
RDW: 15.3 % — ABNORMAL HIGH (ref 11.0–15.0)
WBC: 5.3 10*3/uL (ref 3.8–10.8)

## 2016-02-09 LAB — LIPID PANEL
CHOLESTEROL: 171 mg/dL (ref 125–200)
HDL: 56 mg/dL (ref >=40)
LDL CALC: 104 mg/dL (ref <130)
Total CHOL/HDL Ratio: 3.1 Ratio (ref <=5.0)
Triglycerides: 56 mg/dL (ref <150)
VLDL: 11 mg/dL (ref <30)

## 2016-02-09 LAB — COMPLETE METABOLIC PANEL WITH GFR
ALT: 15 U/L (ref 9–46)
AST: 19 U/L (ref 10–40)
Albumin: 3.7 g/dL (ref 3.6–5.1)
Alkaline Phosphatase: 65 U/L (ref 40–115)
BUN: 17 mg/dL (ref 7–25)
CALCIUM: 8.8 mg/dL (ref 8.6–10.3)
CHLORIDE: 103 mmol/L (ref 98–110)
CO2: 27 mmol/L (ref 20–31)
Creat: 1.87 mg/dL — ABNORMAL HIGH (ref 0.60–1.35)
GFR, EST AFRICAN AMERICAN: 48 mL/min — AB (ref >=60)
GFR, Est Non African American: 41 mL/min — ABNORMAL LOW (ref >=60)
Glucose, Bld: 108 mg/dL — ABNORMAL HIGH (ref 65–99)
POTASSIUM: 4.6 mmol/L (ref 3.5–5.3)
Sodium: 138 mmol/L (ref 135–146)
Total Bilirubin: 0.8 mg/dL (ref 0.2–1.2)
Total Protein: 6.8 g/dL (ref 6.1–8.1)

## 2016-02-09 LAB — POCT GLYCOSYLATED HEMOGLOBIN (HGB A1C): HEMOGLOBIN A1C: 6.4

## 2016-02-09 MED ORDER — LISINOPRIL 40 MG PO TABS: 40.0000 mg | ORAL_TABLET | Freq: Every day | ORAL | Status: DC

## 2016-02-09 MED ORDER — AMLODIPINE BESYLATE 10 MG PO TABS: 10.0000 mg | ORAL_TABLET | Freq: Every day | ORAL | Status: DC

## 2016-02-09 NOTE — Assessment & Plan Note (Signed)
Uncontrolled. Due to medication non-adhearance.  No red flag signs/symptoms. -restarted Lisinopril 40 mg daily and Amlodipine 10 mg daily (patient counseled to start today) -RN visit on Monday 7/24, unable to get in 24 hours as will be Saturday -Return precautions discussed -Check basic labs

## 2016-02-09 NOTE — Assessment & Plan Note (Signed)
BMI today of 43. Patient reports 30-40 pound weight loss over past 4-5 months. -encouraged continued dietary changes -encouraged regular exercise

## 2016-02-09 NOTE — Assessment & Plan Note (Signed)
Patient reports vision changes. Failed vision screen today. Partially due to right brow ptosis from bell's palsy.  -patient called and scheduled follow up with opthalmology in office today

## 2016-02-09 NOTE — Assessment & Plan Note (Signed)
Controlled. A1C 6.4. Patient reports non-adhearence with medications however suspect improvement of A1C due to lifestyle changes. -foot exam normal today -hold on restarting any DM medications -patient to follow up with Ophthalmology for yearly dilated eye exam

## 2016-02-09 NOTE — Progress Notes (Signed)
   Subjective:    Patient ID: Jeremy [name], male    DOB: November 17, 1965, 50 y.o.   MRN: [id number]  HPI 50 y/o male presents for routine follow up. Last office visit was on 02/14/2014.   HTN - has not been taking BP meds, no chest pain, no headaches, reports some changes in vision - things that are far away are less clear, supposed to have surgery on the right eye (bell's palsy on right eye and eye lid falls down). Sees clearly when he lifts his eye.   Type 2 DM - has not been taking diabetes meds, some polyuria, no polydipsia, no recent eye exam.   Morbid Obesity - has attempted to cut out soda recently (was drinking multiple per day), no regular exercise, trying to stay away from greasy/fatty foods, cutting down on portion size, has lost 30-40 pounds (since March).   Social - former smoker, now has stable employment and health insurance   Review of Systems  Constitutional: Negative for fever, chills and fatigue.  Respiratory: Negative for cough.   Cardiovascular: Negative for chest pain and leg swelling.  Gastrointestinal: Negative for nausea, vomiting and diarrhea.       Objective:   Physical Exam BP 225/125 mmHg  Pulse 88  Temp(Src) 98.4 F (36.9 C) (Oral)  Ht 5\' 8"  (1.727 m)  Wt 288 lb 6.4 oz (130.817 kg)  BMI 43.86 kg/m2  Repeat BP 210/110  Gen: pleasant obese AAM, NAD HEENT: normocephalic, PERRL, EOMI, right eyelid hangs down over eye, MMM Cardiac: RRR, S1 and S2 present, no murmur Resp: CTAB, normal effort Ext: no pedal edema Foot Exam: no deformities, 2+ DP pulses bilaterally, sensation to monofilament testing intact Neuro: CN 2-12 intact, strength grossly 5/5, no deficits to light touch in extremities, normal gait Failed Vision Screening - see CMA documentation  POC A1C - 6.4     Assessment & Plan:  Hypertension Uncontrolled. Due to medication non-adhearance.  No red flag signs/symptoms. -restarted Lisinopril 40 mg daily and Amlodipine 10 mg daily (patient  counseled to start today) -RN visit on Monday 7/24, unable to get in 24 hours as will be Saturday -Return precautions discussed -Check basic labs  Diabetes mellitus type II, uncontrolled Controlled. A1C 6.4. Patient reports non-adhearence with medications however suspect improvement of A1C due to lifestyle changes. -foot exam normal today -hold on restarting any DM medications -patient to follow up with Ophthalmology for yearly dilated eye exam  Vision changes Patient reports vision changes. Failed vision screen today. Partially due to right brow ptosis from bell's palsy.  -patient called and scheduled follow up with opthalmology in office today  Morbid obesity BMI today of 43. Patient reports 30-40 pound weight loss over past 4-5 months. -encouraged continued dietary changes -encouraged regular exercise

## 2016-02-09 NOTE — Patient Instructions (Addendum)
It was nice to see you today.  Blood Pressure - elevated today, restart Amlodipine 10 mg daily and Lisinopril 40 mg daily.   Diabetes - A1C 6.4 ( this is very good), please keep up the dietary changes, please follow up with your eye.   Dr. Ree Kida will call you with your lab results.   Please return Monday for nurse visit to recheck your blood pressure. (schedule on your way out today).

## 2016-02-10 ENCOUNTER — Telehealth: Payer: Self-pay | Admitting: Family Medicine

## 2016-02-10 DIAGNOSIS — N289 Disorder of kidney and ureter, unspecified: Secondary | ICD-10-CM

## 2016-02-10 DIAGNOSIS — H539 Unspecified visual disturbance: Secondary | ICD-10-CM

## 2016-02-10 LAB — HIV ANTIBODY (ROUTINE TESTING W REFLEX): HIV 1&2 Ab, 4th Generation: NONREACTIVE

## 2016-02-10 NOTE — Assessment & Plan Note (Signed)
Cr elevated to 1.87. Likely due to longstanding HTN and DM (paitent has not been taking medications).  -recheck Cr at next office visit

## 2016-02-10 NOTE — Telephone Encounter (Signed)
Called and discussed labs with patient (CMP/CBC/Lipids/HIV). Cr elevated likely due to DM and HTN. Patient was able to start Amlodipine and Lisinopril yesterday. No side effects (chest pain/headaches/lightheaded/etc.). Patient confirmed that vision changes are chronic (present over at least the past few month, no new changes). He called opthalmology yesterday and he states that they do not take his insurance. I will place a referral in EPIC to opthalmology.

## 2016-02-12 ENCOUNTER — Ambulatory Visit (INDEPENDENT_AMBULATORY_CARE_PROVIDER_SITE_OTHER): Payer: Commercial Managed Care - PPO | Admitting: *Deleted

## 2016-02-12 VITALS — BP 180/90

## 2016-02-12 DIAGNOSIS — I10 Essential (primary) hypertension: Secondary | ICD-10-CM

## 2016-02-12 MED ORDER — HYDROCHLOROTHIAZIDE 25 MG PO TABS: 25.0000 mg | ORAL_TABLET | Freq: Every day | ORAL | 3 refills | Status: DC

## 2016-02-12 NOTE — Addendum Note (Signed)
Addended by: Lupita Dawn on: 02/12/2016 09:55 AM   Modules accepted: Orders

## 2016-02-12 NOTE — Progress Notes (Signed)
Sent in prescription for HCTZ 25 mg daily.

## 2016-02-12 NOTE — Progress Notes (Signed)
Pt presents for BP check today.  He was started on lisinopril 40mg  and amlodipine  10mg  on Friday.  His BP today is still elevated (see flowsheet).  Pt took his meds @ 7am and is asymptomatic. Spoke with Dr. Ree Kida he will start on HCTZ 25mg s and to have a BP check on Thursday.  Pt is aware and agreeable. Kollins Fenter, Salome Spotted, CMA

## 2016-02-13 NOTE — Telephone Encounter (Signed)
Referral sent to [facility] by asha. They will contact with appt.Deseree Kennon Holter, CMA

## 2016-02-15 ENCOUNTER — Ambulatory Visit (INDEPENDENT_AMBULATORY_CARE_PROVIDER_SITE_OTHER): Payer: Commercial Managed Care - PPO | Admitting: *Deleted

## 2016-02-15 VITALS — BP 160/90 | HR 85

## 2016-02-15 DIAGNOSIS — I10 Essential (primary) hypertension: Secondary | ICD-10-CM

## 2016-02-15 DIAGNOSIS — Z0130 Encounter for examination of blood pressure without abnormal findings: Secondary | ICD-10-CM

## 2016-02-15 DIAGNOSIS — Z136 Encounter for screening for cardiovascular disorders: Secondary | ICD-10-CM

## 2016-02-15 NOTE — Progress Notes (Signed)
   Patient in nurse clinic for blood pressure check.  Patient reported taking blood pressure medication as prescribed.  He took medication this morning at 6 AM.  Patient denies any symptoms today.  Discussed with patient the goal of getting blood pressure below 140/90.  Patient stated he is making adjustments to his diet.  Advised patient to keep next appointment 03/14/16 at 8:30 AM with PCP.  Please call clinic or go to emergency room if you develop chest pain, shortness of breathe, dizziness, severe headache, visual changes, numbness/tingling of extremities.  Patient verbalized understanding.  Derl Barrow, RN   Today's Vitals   02/15/16 0836 02/15/16 0846  BP: (!) 170/100 (!) 160/90  Pulse: 85   SpO2: 97%   PainSc: 0-No pain

## 2016-02-15 NOTE — Patient Instructions (Signed)
   Please keep your next appointment for high blood pressure with your PCP.  Please call clinic or go to emergency room if you develop chest pain, shortness of breathe, dizziness, severe headache, visual changes, numbness/tingling of extremities.   The patient is asked to make an attempt to improve diet and exercise patterns to aid in medical management of this problem.  Next appointment 03/14/16 at 8:30 AM with Dr. Ree Kida.  Current Outpatient Prescriptions on File Prior to Visit  Medication Sig Dispense Refill  . amLODipine (NORVASC) 10 MG tablet Take 1 tablet (10 mg total) by mouth daily. 30 tablet 1  . hydrochlorothiazide (HYDRODIURIL) 25 MG tablet Take 1 tablet (25 mg total) by mouth daily. 30 tablet 3  . lisinopril (PRINIVIL,ZESTRIL) 40 MG tablet Take 1 tablet (40 mg total) by mouth daily. 30 tablet 2  . aspirin EC 81 MG tablet Take 1 tablet (81 mg total) by mouth daily. 90 tablet 3  . Blood Pressure Monitoring (ADULT BLOOD PRESSURE CUFF LG) KIT 1 kit by Does not apply route 2 (two) times daily. 1 each 0  . Insulin Syringes, Disposable, U-100 0.3 ML MISC 1 each by Does not apply route 3 (three) times daily. 100 each 11   No current facility-administered medications on file prior to visit.     Derl Barrow, RN

## 2016-02-16 NOTE — Progress Notes (Signed)
RN staff - please call the patient to schedule visit with Dr. Valentina Lucks for home blood pressure monitoring. Thanks.

## 2016-03-14 ENCOUNTER — Ambulatory Visit (INDEPENDENT_AMBULATORY_CARE_PROVIDER_SITE_OTHER): Payer: Commercial Managed Care - HMO | Admitting: Family Medicine

## 2016-03-14 ENCOUNTER — Encounter: Payer: Self-pay | Admitting: Family Medicine

## 2016-03-14 VITALS — BP 185/110 | HR 74 | Temp 98.0°F | Wt 281.0 lb

## 2016-03-14 DIAGNOSIS — I10 Essential (primary) hypertension: Secondary | ICD-10-CM

## 2016-03-14 DIAGNOSIS — Z0000 Encounter for general adult medical examination without abnormal findings: Secondary | ICD-10-CM | POA: Diagnosis not present

## 2016-03-14 DIAGNOSIS — Z23 Encounter for immunization: Secondary | ICD-10-CM | POA: Diagnosis not present

## 2016-03-14 DIAGNOSIS — M25512 Pain in left shoulder: Secondary | ICD-10-CM | POA: Diagnosis not present

## 2016-03-14 MED FILL — AMLODIPINE BESYLATE 10 MG T: 10 | 30 days supply | Qty: 30 | Fill #0

## 2016-03-14 MED FILL — HYDROCHLOROTHIAZIDE 25 MG T: 25 | 30 days supply | Qty: 30 | Fill #0

## 2016-03-14 MED FILL — LISINOPRIL 40 MG TABLET: 40 | 30 days supply | Qty: 30 | Fill #0

## 2016-03-14 NOTE — Assessment & Plan Note (Signed)
Elevated to 185/110 (recheck) today after patient ran out of meds 3 days prior -will refill meds today at [place] with help of social work -get new blood pressure cuff to monitor at home -RN visit later this week to recheck BP -follow up with Dr. Ree Kida in 2 weeks

## 2016-03-14 NOTE — Assessment & Plan Note (Signed)
Received influenza, 23 valent PNA, and Tdap vaccines today.

## 2016-03-14 NOTE — Progress Notes (Deleted)
Subjective:     Patient ID: Jeremy [name], male   DOB: [birth year], 50 y.o.   MRN: [id number]  HPI [name] is 50 year old male with a history of hypertension, T2DM, obesity, in clinic today for follow up of blood pressure.  Hypertension Patient reports that his blood pressure has been elevated for the last several days. He ran out of his blood pressure medications 3 days ago, but has not gone to pick up the refilled prescriptions due to financial issues. He does not recall what his blood pressures at home have been on average. Denies headache, N/V, vision changes, dizziness, numbness/tingling, abnormal gait.  L Shoulder Pain Intermittent 10/10 shoulder pain episodes that occur several times a month and can last up to several hours. Not precipitated by muscle use or exertion. Radiates down the medial side of the biceps. Previously received a shoulder injection in 10/2013 with Dr. Adrian Blackwater that helped. Denies muscle weakness, tingling, catching, crepitus.  Health Maintenance The patient is due for his pneumococcal polysaccharide vaccine, tetanus, and influenza.   Review of Systems Pertinent positives and negatives per HPI.    Objective:   Physical Exam  Constitutional: He is oriented to person, place, and time. He appears well-developed and well-nourished. No distress.  Obese male sitting on exam table  Cardiovascular: Normal rate and regular rhythm.   No murmur heard. Pulmonary/Chest: Effort normal and breath sounds normal. He has no wheezes.  Musculoskeletal: He exhibits no edema.       Left shoulder: He exhibits normal range of motion, no tenderness, no swelling and no crepitus.  Spurling, Hawkins, and Neers tests negative. 'Empty-can' and push-off test also negative.  Neurological: He is alert and oriented to person, place, and time.       Assessment:    [name] is 50 year old male with a history of hypertension, T2DM, obesity, in clinic today for follow up of  blood pressure.    Plan:  HTN -elevated to 185/110 (recheck) today after patient ran out of meds 3 days prior -controlled on current regimen of amlodipine, HCTZ, lisinopril -will refill meds today at [place] with help of social work -get new blood pressure cuff to monitor at home  L Shoulder Pain -use tylenol and ice/heat as needed when pain present -avoid NSAIDs (naprosyn, ibuprofen, etc.), as we don't want to worsen your kidney function (SCr elevated to 1.87 at last visit)  Health Maintenance -received appropriate vaccinations today: pneumococcal polysaccharide, tetanus, influenza

## 2016-03-14 NOTE — Progress Notes (Signed)
Patient ID: Jeremy [name], male   DOB: [birth year], 50 y.o.   MRN: [id number]  Social work consult from Dr. Ree Kida , patient needs assistance with obtaining medications.  LCSW and Social work Theatre manager met with patient to discuss the above consult.  Patient is employed and has insurance but unable to afford  $30.00 per month for his medications.  Dr. Ree Kida would like patient to receive assistance from the indigent fund to assist with getting meds today.  The Following was discussed: cost of medicine, importance of taking meds, one time assistance from [facility] and Medication Assistance program.  Patient was given RX by MD to take to [place].  Patient very appreciative of assistance.   Plan: Pharmacy called CSW, patient's Rx's total $27.00. CSW will take money from the indigent funds to pay for medication. Patient will call MAP to see if he is eligible for his ongoing mediation needs.  Casimer Lanius, LCSW Licensed Clinical Social Worker [place]   [phone] 10:20 AM

## 2016-03-14 NOTE — Patient Instructions (Addendum)
It was a pleasure seeing you in clinic today, Jeremy [name]! We discussed your blood pressure control and the importance of taking your medications.  1. Hypertension -continue your current medications: amolodipine, HCTZ, lisinopril -we will speak to social work and the pharmacy to see if we can do anything about the cost burden  -filled a prescription for a new blood pressure cuff (check multiple places for prices)  2. Shoulder pain: likely related to tendopathy due to repeated use  -use tylenol and ice/heat as needed when pain comes on -avoid NSAIDs (naprosyn, ibuprofen, etc.) because we are concerned with your kidneys  3. Health Maintenance: Pneumoccocal vaccine, Tetanus, Influenza today  Contact/visit [place] if you experience any alarming symptoms such as severe headache, dizziness, weakness, vision changes. We will fill out and fax your insurance form

## 2016-03-14 NOTE — Assessment & Plan Note (Signed)
Intermittent left shoulder pain. Rotator cuff testing normal. No impingment signs. Suspect trapezius/deltoid overuse injury. -trial of prn Tylenol and ice/heat -avoid NSAID's given renal disease.

## 2016-03-14 NOTE — Progress Notes (Signed)
Subjective:     Patient ID: Jeremy [name], male   DOB: [birth year], 50 y.o.   MRN: [id number]  HPI Jeremy [name] is 50 year old male with a history of hypertension, T2DM, obesity, in clinic today for follow up of blood pressure.  Hypertension Patient reports that his blood pressure has been elevated for the last several days. He ran out of his blood pressure medications 3 days ago, but has not gone to pick up the refilled prescriptions due to financial issues. He does not recall what his blood pressures at home have been on average. Denies headache, N/V, vision changes, dizziness, numbness/tingling, abnormal gait.  L Shoulder Pain Intermittent 10/10 shoulder pain episodes that occur several times a month and can last up to several hours. Not precipitated by muscle use or exertion. Radiates down the lateral side of the biceps. Previously received a shoulder injection in 10/2013 with Dr. Adrian Blackwater that helped. Denies muscle weakness, tingling, catching, crepitus. Pain improved with PRN NSAID's  Health Maintenance The patient is due for his pneumococcal polysaccharide vaccine, tetanus, and influenza.   Social - nonsmoker  Review of Systems Pertinent positives and negatives per HPI.    Objective:   Physical Exam  Constitutional: He is oriented to person, place, and time. He appears well-developed and well-nourished. No distress.  Obese male sitting on exam table  Cardiovascular: Normal rate and regular rhythm.   No murmur heard. Pulmonary/Chest: Effort normal and breath sounds normal. He has no wheezes.  Musculoskeletal: He exhibits no edema.       Left shoulder: He exhibits normal range of motion, no tenderness, no swelling and no crepitus.  Spurling, Hawkins, and Neers tests negative. 'Empty-can' and push-off test also negative.  Neurological: He is alert and oriented to person, place, and time.   Rotator cuff strength testing 5/5 on empty can/lift off/ER.     Assessment:    Jeremy [name] is 50 year old male with a history of hypertension, T2DM, obesity, in clinic today for follow up of blood pressure.    Plan:  Hypertension Elevated to 185/110 (recheck) today after patient ran out of meds 3 days prior -will refill meds today at [place] with help of social work -get new blood pressure cuff to monitor at home -RN visit later this week to recheck BP -follow up with Dr. Ree Kida in 2 weeks  Left shoulder pain Intermittent left shoulder pain. Rotator cuff testing normal. No impingment signs. Suspect trapezius/deltoid overuse injury. -trial of prn Tylenol and ice/heat -avoid NSAID's given renal disease.   Health care maintenance Received influenza, 23 valent PNA, and Tdap vaccines today.

## 2016-03-21 ENCOUNTER — Ambulatory Visit (INDEPENDENT_AMBULATORY_CARE_PROVIDER_SITE_OTHER): Payer: Commercial Managed Care - HMO | Admitting: *Deleted

## 2016-03-21 VITALS — BP 152/70 | HR 82

## 2016-03-21 DIAGNOSIS — I10 Essential (primary) hypertension: Secondary | ICD-10-CM

## 2016-03-21 DIAGNOSIS — Z0130 Encounter for examination of blood pressure without abnormal findings: Secondary | ICD-10-CM

## 2016-03-21 DIAGNOSIS — Z136 Encounter for screening for cardiovascular disorders: Secondary | ICD-10-CM

## 2016-03-21 NOTE — Patient Instructions (Signed)
DASH Eating Plan  DASH stands for "Dietary Approaches to Stop Hypertension." The DASH eating plan is a healthy eating plan that has been shown to reduce high blood pressure (hypertension). Additional health benefits may include reducing the risk of type 2 diabetes mellitus, heart disease, and stroke. The DASH eating plan may also help with weight loss.  WHAT DO I NEED TO KNOW ABOUT THE DASH EATING PLAN?  For the DASH eating plan, you will follow these general guidelines:  · Choose foods with a percent daily value for sodium of less than 5% (as listed on the food label).  · Use salt-free seasonings or herbs instead of table salt or sea salt.  · Check with your health care provider or pharmacist before using salt substitutes.  · Eat lower-sodium products, often labeled as "lower sodium" or "no salt added."  · Eat fresh foods.  · Eat more vegetables, fruits, and low-fat dairy products.  · Choose whole grains. Look for the word "whole" as the first word in the ingredient list.  · Choose fish and skinless chicken or turkey more often than red meat. Limit fish, poultry, and meat to 6 oz (170 g) each day.  · Limit sweets, desserts, sugars, and sugary drinks.  · Choose heart-healthy fats.  · Limit cheese to 1 oz (28 g) per day.  · Eat more home-cooked food and less restaurant, buffet, and fast food.  · Limit fried foods.  · Cook foods using methods other than frying.  · Limit canned vegetables. If you do use them, rinse them well to decrease the sodium.  · When eating at a restaurant, ask that your food be prepared with less salt, or no salt if possible.  WHAT FOODS CAN I EAT?  Seek help from a dietitian for individual calorie needs.  Grains  Whole grain or whole wheat bread. Brown rice. Whole grain or whole wheat pasta. Quinoa, bulgur, and whole grain cereals. Low-sodium cereals. Corn or whole wheat flour tortillas. Whole grain cornbread. Whole grain crackers. Low-sodium crackers.  Vegetables  Fresh or frozen vegetables  (raw, steamed, roasted, or grilled). Low-sodium or reduced-sodium tomato and vegetable juices. Low-sodium or reduced-sodium tomato sauce and paste. Low-sodium or reduced-sodium canned vegetables.   Fruits  All fresh, canned (in natural juice), or frozen fruits.  Meat and Other Protein Products  Ground beef (85% or leaner), grass-fed beef, or beef trimmed of fat. Skinless chicken or turkey. Ground chicken or turkey. Pork trimmed of fat. All fish and seafood. Eggs. Dried beans, peas, or lentils. Unsalted nuts and seeds. Unsalted canned beans.  Dairy  Low-fat dairy products, such as skim or 1% milk, 2% or reduced-fat cheeses, low-fat ricotta or cottage cheese, or plain low-fat yogurt. Low-sodium or reduced-sodium cheeses.  Fats and Oils  Tub margarines without trans fats. Light or reduced-fat mayonnaise and salad dressings (reduced sodium). Avocado. Safflower, olive, or canola oils. Natural peanut or almond butter.  Other  Unsalted popcorn and pretzels.  The items listed above may not be a complete list of recommended foods or beverages. Contact your dietitian for more options.  WHAT FOODS ARE NOT RECOMMENDED?  Grains  White bread. White pasta. White rice. Refined cornbread. Bagels and croissants. Crackers that contain trans fat.  Vegetables  Creamed or fried vegetables. Vegetables in a cheese sauce. Regular canned vegetables. Regular canned tomato sauce and paste. Regular tomato and vegetable juices.  Fruits  Dried fruits. Canned fruit in light or heavy syrup. Fruit juice.  Meat and Other Protein   Products  Fatty cuts of meat. Ribs, chicken wings, bacon, sausage, bologna, salami, chitterlings, fatback, hot dogs, bratwurst, and packaged luncheon meats. Salted nuts and seeds. Canned beans with salt.  Dairy  Whole or 2% milk, cream, half-and-half, and cream cheese. Whole-fat or sweetened yogurt. Full-fat cheeses or blue cheese. Nondairy creamers and whipped toppings. Processed cheese, cheese spreads, or cheese  curds.  Condiments  Onion and garlic salt, seasoned salt, table salt, and sea salt. Canned and packaged gravies. Worcestershire sauce. Tartar sauce. Barbecue sauce. Teriyaki sauce. Soy sauce, including reduced sodium. Steak sauce. Fish sauce. Oyster sauce. Cocktail sauce. Horseradish. Ketchup and mustard. Meat flavorings and tenderizers. Bouillon cubes. Hot sauce. Tabasco sauce. Marinades. Taco seasonings. Relishes.  Fats and Oils  Butter, stick margarine, lard, shortening, ghee, and bacon fat. Coconut, palm kernel, or palm oils. Regular salad dressings.  Other  Pickles and olives. Salted popcorn and pretzels.  The items listed above may not be a complete list of foods and beverages to avoid. Contact your dietitian for more information.  WHERE CAN I FIND MORE INFORMATION?  National Heart, Lung, and Blood Institute: www.nhlbi.nih.gov/health/health-topics/topics/dash/     This information is not intended to replace advice given to you by your health care provider. Make sure you discuss any questions you have with your health care provider.     Document Released: 06/27/2011 Document Revised: 07/29/2014 Document Reviewed: 05/12/2013  Elsevier Interactive Patient Education ©2016 Elsevier Inc.

## 2016-03-21 NOTE — Progress Notes (Signed)
   Patient presents for BP check after restarting BP meds 4 days ago. Med list reviewed. States he took all 3 BP meds 90 minutes ago. Patient sat for 5 minutes before BP taken Discussed need for low sodium diet and using Mrs. Dash as alternative to salt. Encouraged to choose foods with 5% or less of daily value for sodium. Discussed walking 30 minutes per day for exercise Patient denies headaches, blurred vision, SHOB, chest pain, dizziness, abnormal gait   BP 160/74  left arm manually with large cuff  P 82  BP recheck 152/70  left arm manually with large cuff  Patient advised to call for med refills at least 7 days before running out so as not to go without.  Patient aware that he is to f/u with PCP on 03/29/16 at 0930  Patient has home BP monitoring device. Patient instructed on most accurate way to take BP, to keep BP log with date and time and how he is feeling and bring log to all future visits.  Patient given literature on DASH Eating Plan  Annamae Shivley, Orvis Brill, RN

## 2016-03-29 ENCOUNTER — Ambulatory Visit (INDEPENDENT_AMBULATORY_CARE_PROVIDER_SITE_OTHER): Payer: Commercial Managed Care - HMO | Admitting: Family Medicine

## 2016-03-29 VITALS — BP 130/74 | HR 76 | Temp 98.5°F | Wt 285.2 lb

## 2016-03-29 DIAGNOSIS — N289 Disorder of kidney and ureter, unspecified: Secondary | ICD-10-CM | POA: Diagnosis not present

## 2016-03-29 DIAGNOSIS — M25512 Pain in left shoulder: Secondary | ICD-10-CM

## 2016-03-29 LAB — BASIC METABOLIC PANEL WITH GFR
BUN: 32 mg/dL — ABNORMAL HIGH (ref 7–25)
CALCIUM: 9.1 mg/dL (ref 8.6–10.3)
CO2: 24 mmol/L (ref 20–31)
CREATININE: 2.1 mg/dL — AB (ref 0.60–1.35)
Chloride: 107 mmol/L (ref 98–110)
GFR, Est African American: 41 mL/min — ABNORMAL LOW (ref >=60)
GFR, Est Non African American: 36 mL/min — ABNORMAL LOW (ref >=60)
GLUCOSE: 118 mg/dL — AB (ref 65–99)
Potassium: 5 mmol/L (ref 3.5–5.3)
Sodium: 138 mmol/L (ref 135–146)

## 2016-03-29 MED ORDER — METHYLPREDNISOLONE ACETATE 40 MG/ML IJ SUSP
40.0000 mg | Freq: Once | INTRAMUSCULAR | Status: AC
  Administered 2016-03-29: 40 mg via INTRA_ARTICULAR

## 2016-03-29 NOTE — Progress Notes (Signed)
   Subjective:    Patient ID: Jeremy [name], male    DOB: [birth year], 50 y.o.   MRN: [id number]  HPI 50 y/o male presents for follow up of HTN  HTN Amlodipine 10 mg, HCTZ 25 mg, and Lisinopril 40 mg, no chest pain, no vision changes, no headaches, has not checked at home or pharmacy  Left shoulder  Lifted transmission 4 days ago, heard a pop in his shoulder, has had pain since that time (gradually improving), worse with movement overhead, has been massaging the area with icyhot, has not taken any otc medications, has not applied heat or ice  Social - nonsmoker   Review of Systems See above.     Objective:   Physical Exam BP 130/74   Pulse 76   Temp 98.5 F (36.9 C) (Oral)   Wt 285 lb 3.2 oz (129.4 kg)   BMI 43.36 kg/m  Gen: pleasant male, NAD Cardiac: RRR, S1 and S2 present, no murmur Resp: CTAB, normal effort Ext: no edema MSK: left shoulder - deltoid tenderness, pain with Abduction, pain with Hawking's but not Neers, Rotator cuff strength 5/5 (empty can, lift off, ER).   Procedure Note: Subacromial Shoulder Injection (Left)  The risks and benefits of the procedure were discussed with the patient. Written consent was obtained. The posterior shoulder was prepped in a sterile fashion. A total of 40 mg DepoMedrol and 5 cc Lidocaine without Epi were injected into the subacromial space using a 25 gauge 1.5 inch need. The patient tolerate the procedure well. Hemostasis achieved. BandAid applied.       Assessment & Plan:  Hypertension Controlled on Amlodipine 10, HCTZ 25, and Lisinopril 40. -continue current regimen  Renal insufficiency Due for recheck of Cr.   Left shoulder pain Patient has acute exacerbation of left shoulder pain. Exam consistent with possible subacromial bursitis.  -steroid injection completed (see procedure note) -encouraged continued massage with IcyHot and daily iceing

## 2016-03-29 NOTE — Patient Instructions (Addendum)
It was nice to see you today.  Your blood pressure is well controlled today. Continue current medications.  Please ice your shoulder 2-3 times per day, continue to massage with IcyHot or Aspercreme.  Return in 2 months for diabetes check and blood pressure check.

## 2016-03-29 NOTE — Assessment & Plan Note (Signed)
Patient has acute exacerbation of left shoulder pain. Exam consistent with possible subacromial bursitis.  -steroid injection completed (see procedure note) -encouraged continued massage with IcyHot and daily iceing

## 2016-03-29 NOTE — Assessment & Plan Note (Signed)
Due for recheck of Cr.

## 2016-03-29 NOTE — Assessment & Plan Note (Signed)
Controlled on Amlodipine 10, HCTZ 25, and Lisinopril 40. -continue current regimen

## 2016-04-01 ENCOUNTER — Telehealth: Payer: Self-pay | Admitting: Family Medicine

## 2016-04-01 DIAGNOSIS — N289 Disorder of kidney and ureter, unspecified: Secondary | ICD-10-CM

## 2016-04-01 NOTE — Telephone Encounter (Signed)
Nursing staff - please call patient and inform him that his creatinine is slightly elevated from last check in July (kidney's are not functioning as well as Dr. Ree Kida would like). We need to recheck levels. Return in 2 weeks for lab visit (RN to schedule). Continue current blood pressure medications, drink 6-8 glasses of water per day, do NOT take ibuprofen/Motrin.

## 2016-04-01 NOTE — Telephone Encounter (Signed)
Pt informed (didnt seem too happy about what results). Also scheduled him for a lab appt in 2 weeks. Chrishauna Mee Kennon Holter, CMA

## 2016-04-15 ENCOUNTER — Other Ambulatory Visit: Payer: Commercial Managed Care - HMO

## 2016-04-15 DIAGNOSIS — N289 Disorder of kidney and ureter, unspecified: Secondary | ICD-10-CM

## 2016-04-15 LAB — BASIC METABOLIC PANEL WITH GFR
BUN: 40 mg/dL — ABNORMAL HIGH (ref 7–25)
CALCIUM: 9.3 mg/dL (ref 8.6–10.3)
CO2: 24 mmol/L (ref 20–31)
Chloride: 108 mmol/L (ref 98–110)
Creat: 2.29 mg/dL — ABNORMAL HIGH (ref 0.60–1.35)
GFR, EST AFRICAN AMERICAN: 37 mL/min — AB (ref >=60)
GFR, EST NON AFRICAN AMERICAN: 32 mL/min — AB (ref >=60)
Glucose, Bld: 176 mg/dL — ABNORMAL HIGH (ref 65–99)
POTASSIUM: 5.2 mmol/L (ref 3.5–5.3)
SODIUM: 139 mmol/L (ref 135–146)

## 2016-04-16 ENCOUNTER — Telehealth: Payer: Self-pay | Admitting: Family Medicine

## 2016-04-16 ENCOUNTER — Encounter: Payer: Self-pay | Admitting: Family Medicine

## 2016-04-16 DIAGNOSIS — I10 Essential (primary) hypertension: Secondary | ICD-10-CM

## 2016-04-16 DIAGNOSIS — N289 Disorder of kidney and ureter, unspecified: Secondary | ICD-10-CM

## 2016-04-16 MED ORDER — HYDROCHLOROTHIAZIDE 25 MG PO TABS: 25.0000 mg | ORAL_TABLET | Freq: Every day | ORAL | 2 refills | Status: DC

## 2016-04-16 MED ORDER — AMLODIPINE BESYLATE 10 MG PO TABS: 10.0000 mg | ORAL_TABLET | Freq: Every day | ORAL | 2 refills | Status: DC

## 2016-04-16 MED ORDER — METOPROLOL TARTRATE 25 MG PO TABS: 25.0000 mg | ORAL_TABLET | Freq: Two times a day (BID) | ORAL | 2 refills | Status: DC

## 2016-04-16 NOTE — Progress Notes (Signed)
Completed [employer] Form. Please call patient to pick up form.  Placed at front desk.

## 2016-04-16 NOTE — Telephone Encounter (Signed)
Pt scheduled for U/S and informed. Cigi Bega Kennon Holter, CMA

## 2016-04-16 NOTE — Telephone Encounter (Signed)
Called patient to discuss that Cr has increased even more. Will stop Lisinopril as this can cause AKI. Start Metoprolol. Check renal US. Referral to Nephrology. Patient also needs refills of HCTZ and Amlodipine.   RN staff - please schedule renal US. Please also confirm that patient will stop Lisinopril and start Metoprolol.

## 2016-04-16 NOTE — Assessment & Plan Note (Signed)
Called patient to discuss that Cr (2.29) has increased even more. Will stop Lisinopril as this can cause AKI. Start Metoprolol. Check renal US. Referral to Nephrology.

## 2016-04-25 NOTE — Progress Notes (Signed)
Tried calling patient to inform, no answer, voicemail not set up. Will try again later.

## 2016-04-26 ENCOUNTER — Encounter: Payer: Self-pay | Admitting: Family Medicine

## 2016-04-26 MED ORDER — FUROSEMIDE 80 MG PO TABS: 80.0000 mg | ORAL_TABLET | Freq: Every day | ORAL | 3 refills | Status: DC

## 2016-04-26 NOTE — Progress Notes (Signed)
Reviewed Nephrology Note ([facility] Florene [facility]) from 04/23/16 Visit Dx. HTN and CKD 3 Med Changes: stopped HCTZ, started Lasix 80 mg daily (updated EPIC)

## 2016-04-29 NOTE — Progress Notes (Signed)
Called again, no answer

## 2016-05-03 ENCOUNTER — Ambulatory Visit (HOSPITAL_COMMUNITY): Payer: Commercial Managed Care - HMO

## 2016-05-13 ENCOUNTER — Ambulatory Visit
Admission: RE | Admit: 2016-05-13 | Discharge: 2016-05-13 | Disposition: A | Discharge status: Home / Self Care | Payer: Commercial Managed Care - HMO | Source: Ambulatory Visit | Attending: Family Medicine | Admitting: Family Medicine

## 2016-05-13 DIAGNOSIS — N289 Disorder of kidney and ureter, unspecified: Secondary | ICD-10-CM

## 2016-05-14 ENCOUNTER — Encounter: Payer: Self-pay | Admitting: Family Medicine

## 2016-05-31 ENCOUNTER — Encounter: Payer: Self-pay | Admitting: Family Medicine

## 2016-05-31 ENCOUNTER — Ambulatory Visit (INDEPENDENT_AMBULATORY_CARE_PROVIDER_SITE_OTHER): Payer: Commercial Managed Care - HMO | Admitting: Family Medicine

## 2016-05-31 VITALS — BP 166/98 | HR 69 | Temp 97.6°F | Wt 294.0 lb

## 2016-05-31 DIAGNOSIS — M25512 Pain in left shoulder: Secondary | ICD-10-CM | POA: Diagnosis not present

## 2016-05-31 DIAGNOSIS — I10 Essential (primary) hypertension: Secondary | ICD-10-CM | POA: Diagnosis not present

## 2016-05-31 DIAGNOSIS — E1165 Type 2 diabetes mellitus with hyperglycemia: Secondary | ICD-10-CM | POA: Diagnosis not present

## 2016-05-31 DIAGNOSIS — N183 Chronic kidney disease, stage 3 unspecified: Secondary | ICD-10-CM

## 2016-05-31 DIAGNOSIS — N289 Disorder of kidney and ureter, unspecified: Secondary | ICD-10-CM

## 2016-05-31 DIAGNOSIS — H02401 Unspecified ptosis of right eyelid: Secondary | ICD-10-CM | POA: Insufficient documentation

## 2016-05-31 DIAGNOSIS — G8929 Other chronic pain: Secondary | ICD-10-CM

## 2016-05-31 DIAGNOSIS — E1122 Type 2 diabetes mellitus with diabetic chronic kidney disease: Secondary | ICD-10-CM

## 2016-05-31 DIAGNOSIS — IMO0001 Reserved for inherently not codable concepts without codable children: Secondary | ICD-10-CM

## 2016-05-31 LAB — BASIC METABOLIC PANEL WITH GFR
BUN: 45 mg/dL — ABNORMAL HIGH (ref 7–25)
CALCIUM: 9.2 mg/dL (ref 8.6–10.3)
CO2: 27 mmol/L (ref 20–31)
Chloride: 104 mmol/L (ref 98–110)
Creat: 2.77 mg/dL — ABNORMAL HIGH (ref 0.60–1.35)
GFR, EST AFRICAN AMERICAN: 30 mL/min — AB (ref >=60)
GFR, EST NON AFRICAN AMERICAN: 26 mL/min — AB (ref >=60)
Glucose, Bld: 218 mg/dL — ABNORMAL HIGH (ref 65–99)
Potassium: 4.8 mmol/L (ref 3.5–5.3)
SODIUM: 140 mmol/L (ref 135–146)

## 2016-05-31 LAB — POCT GLYCOSYLATED HEMOGLOBIN (HGB A1C): Hemoglobin A1C: 8.9

## 2016-05-31 MED ORDER — METOPROLOL TARTRATE 50 MG PO TABS: 25.0000 mg | ORAL_TABLET | Freq: Two times a day (BID) | ORAL | 2 refills | Status: DC

## 2016-05-31 NOTE — Assessment & Plan Note (Signed)
Follows with Nephrology -check urine microalbumin today

## 2016-05-31 NOTE — Progress Notes (Signed)
   Subjective:    Patient ID: Jeremy [name], male    DOB: March 19, 1966, 50 y.o.   MRN: [id number]  HPI 50 y/o male presents for routine follow up.   Hypertension Currently on Amlodipine 10 mg and Metoprolol 25 mg (had only been taking once daily).   Diabetes Currently diet controlled, last A1C 6.4, due for eye exam and foot exam; drinking lots of soda and fast foot/burgers; no formal exercise  Elevated Serum Cr Stopped Lisinopril after last visit due to elevated Cr, sent to Nephrology, renal ultrasound unremarkable, Renal stopped HCTZ and started lasix 80 mg daily  Left Shoulder Pain Still reports left shoulder pain, not taking Ibuprofen as was told not to by Nephrology, applying occasional heat, mostly painful at night, no currently  Health Maintenance   Up to date on immunizations and cancer screening   Review of Systems  Constitutional: Negative for chills, fatigue and fever.  Respiratory: Negative for chest tightness and shortness of breath.        Objective:   Physical Exam BP (!) 166/98   Pulse 69   Temp 97.6 F (36.4 C) (Oral)   Wt 294 lb (133.4 kg)   SpO2 96%   BMI 44.70 kg/m    Gen: pleasant male, NAD, obese Cardiac: RRR, S1 and S2 present, no murmur Resp: CTAB, normal effort Ext: no edema Foot exam: intact no monofilament, 2+ DP pulses, no ulcerations MSK: left shoulder - no tenderness, rotator cuff testing intact (empty can, ER, and lift off), negative neers and hawkings, negative speeds  POC 8.9     Assessment & Plan:  Diabetes mellitus type II, uncontrolled Uncontrolled. A1C 8.9 due to dietary indiscretion.  -discussed lifestyle changes -if remains elevated will initiate therapy   Hypertension Uncontrolled -continue Norvasc 10 mg daily -increase Metoprolol to 50 mg BID -stopped HCTZ and Lisinopril due to elevated Cr  Left shoulder pain Intermittent left shoulder pain. Exam unremarkable. -Tylenol prn -continue to apply heat as needed -if  persists consider sports medicine referral  CKD stage 3 due to type 2 diabetes mellitus (Jeremy [name]) Follows with Nephrology -check urine microalbumin today  Ptosis of eyelid, right Patient to have surgery to correct ptosis

## 2016-05-31 NOTE — Assessment & Plan Note (Signed)
Uncontrolled -continue Norvasc 10 mg daily -increase Metoprolol to 50 mg BID -stopped HCTZ and Lisinopril due to elevated Cr

## 2016-05-31 NOTE — Assessment & Plan Note (Signed)
Patient to have surgery to correct ptosis

## 2016-05-31 NOTE — Assessment & Plan Note (Signed)
Intermittent left shoulder pain. Exam unremarkable. -Tylenol prn -continue to apply heat as needed -if persists consider sports medicine referral

## 2016-05-31 NOTE — Assessment & Plan Note (Signed)
Uncontrolled. A1C 8.9 due to dietary indiscretion.  -discussed lifestyle changes -if remains elevated will initiate therapy

## 2016-05-31 NOTE — Patient Instructions (Addendum)
It was nice to see you today.  Dr. Ree Kida will call you with your lab results.  Left shoulder - it is ok to take Tylenol as needed for pain, apply heat and ice as needed to help with pain  Diabetes - please eliminate soda from your diet, attempt to walk for 20 minutes 2-3 times per day  Blood Pressure - increase Metoprolol to 50 mg twice daily, continue Amlodipine 10 mg once daily and Lasix 80 mg daily  Return to office in 4 weeks.  Please check your blood pressure at the pharmacy 2-3 per week.

## 2016-06-01 LAB — MICROALBUMIN / CREATININE URINE RATIO
CREATININE, URINE: 52 mg/dL (ref 20–370)
MICROALB UR: 7.3 mg/dL
Microalb Creat Ratio: 140 mcg/mg creat — ABNORMAL HIGH (ref <30)

## 2016-06-03 ENCOUNTER — Telehealth: Payer: Self-pay | Admitting: *Deleted

## 2016-06-03 ENCOUNTER — Telehealth: Payer: Self-pay | Admitting: Family Medicine

## 2016-06-03 DIAGNOSIS — R809 Proteinuria, unspecified: Secondary | ICD-10-CM

## 2016-06-03 DIAGNOSIS — I10 Essential (primary) hypertension: Secondary | ICD-10-CM

## 2016-06-03 DIAGNOSIS — E1129 Type 2 diabetes mellitus with other diabetic kidney complication: Secondary | ICD-10-CM

## 2016-06-03 MED ORDER — METOPROLOL TARTRATE 50 MG PO TABS: 50.0000 mg | ORAL_TABLET | Freq: Two times a day (BID) | ORAL | 2 refills | Status: DC

## 2016-06-03 NOTE — Telephone Encounter (Signed)
Discussed elevated microalbumin and Cr level with patient. Likely due to new lasix and worsening diabetes. Discussed continued lifestyle changes including decreased soda intake and regular exercise.

## 2016-06-03 NOTE — Telephone Encounter (Signed)
Received from [facility] needing directions for Metoprolol 50 mg clarified.  Rx sent: take 25 mg by twice a day.  Per office note new directions 1 tablet BID. New correction sent in to [facility].  Derl Barrow, RN

## 2016-07-03 NOTE — Progress Notes (Deleted)
   Subjective:    Patient ID: Jeremy [name], male    DOB: February 06, 1966, 50 y.o.   MRN: [id number]  HPI 50 y/o male presents for follow up of Hypertension.  Hypertension Currently prescribed Metoprolol 50 mg BID, Amlodipine 10 mg daily, and Lasix 80 mg daily.   T2DM A1C elevated last month to 8.9. Counseled on dietary changes.   Left Shoulder Pain Counseled to use heat and Tylenol prn at last visit  HM Due for eye exam (diabetes)   Review of Systems     Objective:   Physical Exam There were no vitals taken for this visit.        Assessment & Plan:  No problem-specific Assessment & Plan notes found for this encounter.

## 2016-07-04 ENCOUNTER — Ambulatory Visit: Payer: Commercial Managed Care - HMO | Admitting: Family Medicine

## 2016-07-05 ENCOUNTER — Ambulatory Visit: Payer: Commercial Managed Care - HMO | Admitting: Family Medicine

## 2017-01-31 ENCOUNTER — Ambulatory Visit (INDEPENDENT_AMBULATORY_CARE_PROVIDER_SITE_OTHER): Payer: 59 | Admitting: Family Medicine

## 2017-01-31 ENCOUNTER — Encounter: Payer: Self-pay | Admitting: Family Medicine

## 2017-01-31 VITALS — BP 182/108 | HR 77 | Temp 98.3°F | Ht 68.0 in | Wt 306.0 lb

## 2017-01-31 DIAGNOSIS — Z1211 Encounter for screening for malignant neoplasm of colon: Secondary | ICD-10-CM

## 2017-01-31 DIAGNOSIS — Z0000 Encounter for general adult medical examination without abnormal findings: Secondary | ICD-10-CM

## 2017-01-31 DIAGNOSIS — I10 Essential (primary) hypertension: Secondary | ICD-10-CM

## 2017-01-31 DIAGNOSIS — IMO0001 Reserved for inherently not codable concepts without codable children: Secondary | ICD-10-CM

## 2017-01-31 DIAGNOSIS — E1165 Type 2 diabetes mellitus with hyperglycemia: Secondary | ICD-10-CM | POA: Diagnosis not present

## 2017-01-31 LAB — HEMOCCULT GUIAC POC 1CARD (OFFICE): FECAL OCCULT BLD: POSITIVE — AB

## 2017-01-31 LAB — POCT GLYCOSYLATED HEMOGLOBIN (HGB A1C): Hemoglobin A1C: 7.7

## 2017-01-31 MED ORDER — METFORMIN HCL 500 MG PO TABS: 500.0000 mg | ORAL_TABLET | Freq: Two times a day (BID) | ORAL | 3 refills | Status: DC

## 2017-01-31 MED ORDER — AMLODIPINE BESYLATE 10 MG PO TABS: 10.0000 mg | ORAL_TABLET | Freq: Every day | ORAL | 3 refills | Status: DC

## 2017-01-31 MED ORDER — ASPIRIN EC 81 MG PO TBEC: 81.0000 mg | DELAYED_RELEASE_TABLET | Freq: Every day | ORAL | 3 refills | Status: DC

## 2017-01-31 MED ORDER — LISINOPRIL 10 MG PO TABS: 10.0000 mg | ORAL_TABLET | Freq: Every day | ORAL | 3 refills | Status: DC

## 2017-01-31 NOTE — Assessment & Plan Note (Deleted)
A 

## 2017-01-31 NOTE — Progress Notes (Signed)
Subjective:   Patient ID: Jeremy [name]    DOB: [birth year], 51 y.o. male   MRN: [id number]  CC: "Annual wellness visit"  HPI: Jeremy [name] is a 51 y.o. male who presents to clinic today annual wellness visit. Problems discussed today are as follows:  Diabetes: Pt states he was previous well controlled last year without medications. He was never told his diabetes was uncontrolled during his last visit.  ROS: Denies nausea or vomiting, loss of motor function, decreased sensation.  Hypertension: Has not taken BP medications in over 6 months. Says he ran out of his refills and decided not to taken them.  Annual wellness visit: Overdue for diabetic eye exam. Was told to get this done last visit but never made the appointment. Has never had a screening colonoscopy. Denies family history of colorectal cancer. ROS: Denies change in vision, melena or hematochezia.  Complete ROS performed, see HPI for pertinent.  [place]: NIDDM complicated by CKDIII, macular edema, ED, HTN, MDD, morbid obesity. SHx I&D extremity. FHx mother DM, heart disease, sister DM, brother DM. Smoking status reviewed. Medications reviewed.  Objective:   BP (!) 182/108   Pulse 77   Temp 98.3 F (36.8 C) (Oral)   Ht _0  (1.727 m)   Wt (!) 306 lb (138.8 kg)   SpO2 97%   BMI 46.53 kg/m  Vitals and nursing note reviewed.  General: morbidly obese, well nourished, well developed, in no acute distress with non-toxic appearance HEENT: normocephalic, atraumatic, moist mucous membranes, right eye ptosis present, PERRLA, EOMI Neck: supple, non-tender without lymphadenopathy CV: regular rate and rhythm without murmurs, rubs, or gallops, minimal lower extremity edema present b/l Lungs: clear to auscultation bilaterally with normal work of breathing Abdomen: soft, non-tender, non-distended, no masses or organomegaly palpable, normoactive bowel sounds Skin: warm, dry, no rashes or lesions, cap refill < 2 seconds Extremities:  warm and well perfused, normal tone  Assessment & Plan:   Hypertension Chronic. Uncontrolled due to not taking meds in over 6 months. Previously on Lopressor, amlodipine, and lasix. BP 182/108.  --Restarting amlodipine 10 mg daily, and adding lisinopril 10 mg daily due to newly dx uncontrolled DM --Continue ASA 81 mg, would need statin at next visit  Health care maintenance Appt for annual wellness for work. Overall has uncontrolled HTN, DM, and likely HLD. Overdue for colonoscopy and optho exam. --Amb referral to optho placed for DM exam --Given info with instruction to schedule screening colonoscopy --Pt to return on 7/22 to obatin fasting lipid panel, will likely need statin due to DM  Diabetes mellitus type II, uncontrolled Chronic. Uncontrolled. A1c 7.7. NOt previously on med therapy. --Initiating Metformin 500 mg daily with instruction to increase to 500 mg twice daily --RTC in 4 weeks  Orders Placed This Encounter  Procedures  . Lipid panel    Standing Status:   Future    Standing Expiration Date:   01/31/2018  . CMP14+EGFR    Standing Status:   Future    Standing Expiration Date:   01/31/2018  . POCT glycosylated hemoglobin (Hb A1C)  . Hemoccult - 1 Card (office)  . POCT CBG (Fasting - Glucose)    Standing Status:   Future    Standing Expiration Date:   01/31/2018   Meds ordered this encounter  Medications  . amLODipine (NORVASC) 10 MG tablet    Sig: Take 1 tablet (10 mg total) by mouth daily.    Dispense:  90 tablet    Refill:  3  .  aspirin EC 81 MG tablet    Sig: Take 1 tablet (81 mg total) by mouth daily.    Dispense:  90 tablet    Refill:  3  . lisinopril (PRINIVIL,ZESTRIL) 10 MG tablet    Sig: Take 1 tablet (10 mg total) by mouth daily.    Dispense:  90 tablet    Refill:  3  . metFORMIN (GLUCOPHAGE) 500 MG tablet    Sig: Take 1 tablet (500 mg total) by mouth 2 (two) times daily with a meal.    Dispense:  180 tablet    Refill:  Godwin,  DO [place], PGY-2 02/01/2017 2:00 PM

## 2017-01-31 NOTE — Assessment & Plan Note (Addendum)
Chronic. Uncontrolled due to not taking meds in over 6 months. Previously on Lopressor, amlodipine, and lasix. BP 182/108.  --Restarting amlodipine 10 mg daily, and adding lisinopril 10 mg daily due to newly dx uncontrolled DM --Continue ASA 81 mg, would need statin at next visit

## 2017-01-31 NOTE — Assessment & Plan Note (Addendum)
Chronic. Uncontrolled. A1c 7.7. NOt previously on med therapy. --Initiating Metformin 500 mg daily with instruction to increase to 500 mg twice daily --RTC in 4 weeks

## 2017-01-31 NOTE — Assessment & Plan Note (Addendum)
Appt for annual wellness for work. Overall has uncontrolled HTN, DM, and likely HLD. Overdue for colonoscopy and optho exam. --Amb referral to optho placed for DM exam --Given info with instruction to schedule screening colonoscopy --Pt to return on 7/22 to obatin fasting lipid panel, will likely need statin due to DM

## 2017-01-31 NOTE — Patient Instructions (Signed)
Thank you for coming in to see [place] today. Please see below to review our plan for today's visit.  1. I have refilled your medications. You will need to take amlodipine 10 mg tablets daily along with a new medication called lisinopril 10 mg daily to help control your blood pressure. In addition, you need to take aspirin 81 mg tablets daily. We will need to recheck your kidney function when you come in to get your lipid panel and fasting glucose. Please come in after 02/08/2017 to get these tests. Do not eat or drink anything prior to getting his blood tests taken. Bring back your form to be signed when you return for the lab draws. 2. I have started you on a new medication called metformin for your diabetes. Take 1 500 mg tablet daily for 1 week and increase to 500 mg tablets twice daily. We will recheck your A1c in 3 months. 4. Call and schedule a screening colonoscopy.  Return to clinic to get your blood drawn after 7/21. I would like to see you in the clinic again in 4 weeks.  Please call the clinic at [phone] if your symptoms worsen or you have any concerns. It was my pleasure to see you. -- Harriet Butte, [place], PGY-2

## 2017-02-10 ENCOUNTER — Other Ambulatory Visit: Payer: 59

## 2017-02-10 DIAGNOSIS — I10 Essential (primary) hypertension: Secondary | ICD-10-CM

## 2017-02-10 NOTE — Progress Notes (Signed)
Patient was not fasting, will come back tomorrow morning

## 2017-02-11 ENCOUNTER — Other Ambulatory Visit (INDEPENDENT_AMBULATORY_CARE_PROVIDER_SITE_OTHER): Payer: 59

## 2017-02-11 DIAGNOSIS — E1165 Type 2 diabetes mellitus with hyperglycemia: Secondary | ICD-10-CM | POA: Diagnosis not present

## 2017-02-11 DIAGNOSIS — IMO0001 Reserved for inherently not codable concepts without codable children: Secondary | ICD-10-CM

## 2017-02-11 DIAGNOSIS — I10 Essential (primary) hypertension: Secondary | ICD-10-CM

## 2017-02-11 LAB — POCT CBG (FASTING - GLUCOSE)-MANUAL ENTRY: GLUCOSE FASTING, POC: 185 mg/dL — AB (ref 70–99)

## 2017-02-11 NOTE — Progress Notes (Signed)
LAB

## 2017-02-12 LAB — CMP14+EGFR
ALBUMIN: 3.8 g/dL (ref 3.5–5.5)
ALT: 15 IU/L (ref 0–44)
AST: 21 IU/L (ref 0–40)
Albumin/Globulin Ratio: 1.3 (ref 1.2–2.2)
Alkaline Phosphatase: 81 IU/L (ref 39–117)
BUN / CREAT RATIO: 11 (ref 9–20)
BUN: 29 mg/dL — ABNORMAL HIGH (ref 6–24)
Bilirubin Total: 0.3 mg/dL (ref 0.0–1.2)
CALCIUM: 9.1 mg/dL (ref 8.7–10.2)
CO2: 21 mmol/L (ref 20–29)
CREATININE: 2.73 mg/dL — AB (ref 0.76–1.27)
Chloride: 108 mmol/L — ABNORMAL HIGH (ref 96–106)
GFR, EST AFRICAN AMERICAN: 30 mL/min/{1.73_m2} — AB (ref >59)
GFR, EST NON AFRICAN AMERICAN: 26 mL/min/{1.73_m2} — AB (ref >59)
GLOBULIN, TOTAL: 2.9 g/dL (ref 1.5–4.5)
Glucose: 175 mg/dL — ABNORMAL HIGH (ref 65–99)
Potassium: 5.1 mmol/L (ref 3.5–5.2)
SODIUM: 143 mmol/L (ref 134–144)
Total Protein: 6.7 g/dL (ref 6.0–8.5)

## 2017-02-12 LAB — LIPID PANEL
CHOLESTEROL TOTAL: 203 mg/dL — AB (ref 100–199)
Chol/HDL Ratio: 3.8 ratio (ref 0.0–5.0)
HDL: 54 mg/dL (ref >39)
LDL Calculated: 134 mg/dL — ABNORMAL HIGH (ref 0–99)
Triglycerides: 76 mg/dL (ref 0–149)
VLDL Cholesterol Cal: 15 mg/dL (ref 5–40)

## 2017-03-03 ENCOUNTER — Encounter: Payer: Self-pay | Admitting: Family Medicine

## 2017-03-03 ENCOUNTER — Ambulatory Visit (INDEPENDENT_AMBULATORY_CARE_PROVIDER_SITE_OTHER): Payer: 59 | Admitting: Family Medicine

## 2017-03-03 VITALS — BP 158/98 | HR 72 | Temp 98.1°F | Ht 68.0 in | Wt 308.0 lb

## 2017-03-03 DIAGNOSIS — Z0000 Encounter for general adult medical examination without abnormal findings: Secondary | ICD-10-CM | POA: Diagnosis not present

## 2017-03-03 DIAGNOSIS — Z794 Long term (current) use of insulin: Secondary | ICD-10-CM

## 2017-03-03 DIAGNOSIS — E1165 Type 2 diabetes mellitus with hyperglycemia: Secondary | ICD-10-CM

## 2017-03-03 DIAGNOSIS — E1139 Type 2 diabetes mellitus with other diabetic ophthalmic complication: Secondary | ICD-10-CM | POA: Diagnosis not present

## 2017-03-03 DIAGNOSIS — I10 Essential (primary) hypertension: Secondary | ICD-10-CM

## 2017-03-03 DIAGNOSIS — E1121 Type 2 diabetes mellitus with diabetic nephropathy: Secondary | ICD-10-CM | POA: Insufficient documentation

## 2017-03-03 DIAGNOSIS — IMO0002 Reserved for concepts with insufficient information to code with codable children: Secondary | ICD-10-CM

## 2017-03-03 DIAGNOSIS — IMO0001 Reserved for inherently not codable concepts without codable children: Secondary | ICD-10-CM

## 2017-03-03 MED ORDER — METFORMIN HCL ER 750 MG PO TB24: 750.0000 mg | ORAL_TABLET | Freq: Every day | ORAL | 0 refills | Status: DC

## 2017-03-03 MED ORDER — ATORVASTATIN CALCIUM 40 MG PO TABS: 40.0000 mg | ORAL_TABLET | Freq: Every day | ORAL | 3 refills | Status: DC

## 2017-03-03 NOTE — Progress Notes (Signed)
   Subjective:   Patient ID: Jeremy [name]    DOB: Jun 11, 1966, 51 y.o. male   MRN: [id number]  CC: "Diabetes"  HPI: Jeremy [name] is a 51 y.o. male who presents to clinic today for diabetes. Problems discussed today are as follows:  Diabetes: Patient recently diagnosed with diabetes by A1c 7.7 last month. Metformin though patient did not pick up prescription. He is worried the medication will aggravate his stomach. ROS: Denies fevers or chills, nausea or vomiting, abdominal pain, change in vision.  Hypertension: Was recently given lisinopril at last visit for uncontrolled blood pressure. Continues to take amlodipine without difficulty.   Colorectal cancer screening: Patient without history of colonoscopy. Tested positive with FOBT last visit. ROS: Denies family history of colorectal cancer, diarrhea or constipation, unexplained weight loss.  Complete ROS performed, see HPI for pertinent.  [place]: NIDDM complicated by CKDIII and macular edema, ED, HTN, MDD, morbid obesity. Surgical history I&D extremity. Family history DM, heart disease. Smoking status reviewed. Medications reviewed.  Objective:   BP (!) 158/98   Pulse 72   Temp 98.1 F (36.7 C) (Oral)   Ht 5\' 8"  (1.727 m)   Wt (!) 308 lb (139.7 kg)   SpO2 97%   BMI 46.83 kg/m  Vitals and nursing note reviewed.  General: morbidly obese, well nourished, well developed, in no acute distress with non-toxic appearance CV: regular rate and rhythm without murmurs, rubs, or gallops, no lower extremity edema Lungs: clear to auscultation bilaterally with normal work of breathing Abdomen: soft, non-tender, non-distended, normoactive bowel sounds Skin: warm, dry, no rashes or lesions, cap refill < 2 seconds Extremities: warm and well perfused, normal tone  Assessment & Plan:   Primary hypertension Chronic. Suboptimally controlled. Recently started on lisinopril in addition to amlodipine. --Continue amlodipine 10 mg daily and lisinopril  10 mg daily --Discontinue aspirin 81 mg daily and start atorvastatin 40 mg daily  Preventative health care Overdue for colonoscopy. No previous colonoscopy screening. FOBT positive last month though no red flag symptoms. --Given information for screening colonoscopy  Diabetes mellitus type 2, uncontrolled ([place]) Chronic. Uncontrolled. Recently diagnosed over last few months. Patient would benefit from metformin and does not need insulin at this time given A1c was 7.7. Patient previously seen by [facility] for requesting alternative ophthalmologist given difficulty with residual ptosis payment situation. --Discontinue regular metformin, will initiate metformin XR 750 mg daily --Ambulatory referral to ophthalmology for annual diabetic eye exam --RTC one month  Orders Placed This Encounter  Procedures  . Ambulatory referral to Ophthalmology    Referral Priority:   Routine    Referral Type:   Consultation    Referral Reason:   Specialty Services Required    Requested Specialty:   Ophthalmology    Number of Visits Requested:   1   Meds ordered this encounter  Medications  . metFORMIN (GLUCOPHAGE XR) 750 MG 24 hr tablet    Sig: Take 1 tablet (750 mg total) by mouth daily with breakfast.    Dispense:  30 tablet    Refill:  0  . atorvastatin (LIPITOR) 40 MG tablet    Sig: Take 1 tablet (40 mg total) by mouth daily.    Dispense:  90 tablet    Refill:  Carl, [place], PGY-2 03/03/2017 5:18 PM

## 2017-03-03 NOTE — Patient Instructions (Signed)
Thank you for coming in to see [place] today. Please see below to review our plan for today's visit.  1. I have sent a prescription for metformin extended release 750 mg daily. If this is too expensive for you, let me know and I will send in a prescription for the regular metformin. 2. Discontinue aspirin and start a new medication called atorvastatin 40 mg daily. This will help prevent complications from diabetes like strokes and heart attacks. See information below. If you experience significant muscle aches, discontinue this medication. 3. I have sent in a request for a different eye doctor per your request. He will need to get your diabetic eye exam every year. 4. Attached is a form for you to call for a screening colonoscopy. Please do so given her recent stool test positive for blood.  Return to clinic in one month.  Please call the clinic at [phone] if your symptoms worsen or you have any concerns. It was my pleasure to see you. -- Harriet Butte, [place], PGY-2  Atorvastatin tablets What is this medicine? ATORVASTATIN (a TORE va sta tin) is known as a HMG-CoA reductase inhibitor or 'statin'. It lowers the level of cholesterol and triglycerides in the blood. This drug may also reduce the risk of heart attack, stroke, or other health problems in patients with risk factors for heart disease. Diet and lifestyle changes are often used with this drug. This medicine may be used for other purposes; ask your health care provider or pharmacist if you have questions. COMMON BRAND NAME(S): Lipitor What should I tell my health care provider before I take this medicine? They need to know if you have any of these conditions: -frequently drink alcoholic beverages -history of stroke, TIA -kidney disease -liver disease -muscle aches or weakness -other medical condition -an unusual or allergic reaction to atorvastatin, other medicines, foods, dyes, or preservatives -pregnant  or trying to get pregnant -breast-feeding How should I use this medicine? Take this medicine by mouth with a glass of water. Follow the directions on the prescription label. You can take this medicine with or without food. Take your doses at regular intervals. Do not take your medicine more often than directed. Talk to your pediatrician regarding the use of this medicine in children. While this drug may be prescribed for children as young as 60 years old for selected conditions, precautions do apply. Overdosage: If you think you have taken too much of this medicine contact a poison control center or emergency room at once. NOTE: This medicine is only for you. Do not share this medicine with others. What if I miss a dose? If you miss a dose, take it as soon as you can. If it is almost time for your next dose, take only that dose. Do not take double or extra doses. What may interact with this medicine? Do not take this medicine with any of the following medications: -red yeast rice -telaprevir -telithromycin -voriconazole This medicine may also interact with the following medications: -alcohol -antiviral medicines for HIV or AIDS -boceprevir -certain antibiotics like clarithromycin, erythromycin, troleandomycin -certain medicines for cholesterol like fenofibrate or gemfibrozil -cimetidine -clarithromycin -colchicine -cyclosporine -digoxin -male hormones, like estrogens or progestins and birth control pills -grapefruit juice -medicines for fungal infections like fluconazole, itraconazole, ketoconazole -niacin -rifampin -spironolactone This list may not describe all possible interactions. Give your health care provider a list of all the medicines, herbs, non-prescription drugs, or dietary supplements you use. Also tell them if  you smoke, drink alcohol, or use illegal drugs. Some items may interact with your medicine. What should I watch for while using this medicine? Visit your doctor  or health care professional for regular check-ups. You may need regular tests to make sure your liver is working properly. Tell your doctor or health care professional right away if you get any unexplained muscle pain, tenderness, or weakness, especially if you also have a fever and tiredness. Your doctor or health care professional may tell you to stop taking this medicine if you develop muscle problems. If your muscle problems do not go away after stopping this medicine, contact your health care professional. This drug is only part of a total heart-health program. Your doctor or a dietician can suggest a low-cholesterol and low-fat diet to help. Avoid alcohol and smoking, and keep a proper exercise schedule. Do not use this drug if you are pregnant or breast-feeding. Serious side effects to an unborn child or to an infant are possible. Talk to your doctor or pharmacist for more information. This medicine may affect blood sugar levels. If you have diabetes, check with your doctor or health care professional before you change your diet or the dose of your diabetic medicine. If you are going to have surgery tell your health care professional that you are taking this drug. What side effects may I notice from receiving this medicine? Side effects that you should report to your doctor or health care professional as soon as possible: -allergic reactions like skin rash, itching or hives, swelling of the face, lips, or tongue -dark urine -fever -joint pain -muscle cramps, pain -redness, blistering, peeling or loosening of the skin, including inside the mouth -trouble passing urine or change in the amount of urine -unusually weak or tired -yellowing of eyes or skin Side effects that usually do not require medical attention (report to your doctor or health care professional if they continue or are bothersome): -constipation -heartburn -stomach gas, pain, upset This list may not describe all possible side  effects. Call your doctor for medical advice about side effects. You may report side effects to FDA at 1-800-FDA-1088. Where should I keep my medicine? Keep out of the reach of children. Store at room temperature between 20 to 25 degrees C (68 to 77 degrees F). Throw away any unused medicine after the expiration date. NOTE: This sheet is a summary. It may not cover all possible information. If you have questions about this medicine, talk to your doctor, pharmacist, or health care provider.  2018 Elsevier/Gold Standard (2011-05-28 76:22:63)

## 2017-03-03 NOTE — Assessment & Plan Note (Addendum)
Overdue for colonoscopy. No previous colonoscopy screening. FOBT positive last month though no red flag symptoms. --Given information for screening colonoscopy

## 2017-03-03 NOTE — Assessment & Plan Note (Addendum)
Chronic. Suboptimally controlled. Recently started on lisinopril in addition to amlodipine. --Continue amlodipine 10 mg daily and lisinopril 10 mg daily --Discontinue aspirin 81 mg daily and start atorvastatin 40 mg daily

## 2017-03-03 NOTE — Assessment & Plan Note (Addendum)
Chronic. Uncontrolled. Recently diagnosed over last few months. Patient would benefit from metformin and does not need insulin at this time given A1c was 7.7. Patient previously seen by [facility] for requesting alternative ophthalmologist given difficulty with residual ptosis payment situation. --Discontinue regular metformin, will initiate metformin XR 750 mg daily --Ambulatory referral to ophthalmology for annual diabetic eye exam --RTC one month

## 2017-03-03 NOTE — Assessment & Plan Note (Deleted)
A 

## 2017-03-06 ENCOUNTER — Telehealth: Payer: Self-pay | Admitting: *Deleted

## 2017-03-06 NOTE — Telephone Encounter (Signed)
Patient states he just missed a call from PCP, no notes in chart. Will forward to MD

## 2017-03-06 NOTE — Telephone Encounter (Signed)
Contacted pt successfully. Thanks.

## 2017-03-20 ENCOUNTER — Ambulatory Visit: Payer: 59 | Admitting: Student

## 2017-03-20 ENCOUNTER — Telehealth: Payer: Self-pay | Admitting: *Deleted

## 2017-03-20 NOTE — Telephone Encounter (Signed)
Patient called requesting to speak with PCP regarding recent leg swelling.  Offered an appointment for this afternoon, but declined due to schedule.  Leg swelling started this past Sunday 03/16/2017. Please give patient a call. Patient scheduled an appointment for Tuesday 03/25/2017.  Derl Barrow, RN

## 2017-03-21 ENCOUNTER — Telehealth: Payer: Self-pay | Admitting: Family Medicine

## 2017-03-21 NOTE — Telephone Encounter (Signed)
Contacted pt regarding call for [facility]. Onset 8/25. Both sides up to calves. Non-pitting. Denies, CP, SOB, orthopnea. States he can walk a flight of stairs without difficult. Pt has been on amlodipine over 6 months so unlikely the source. Advised pt to continue meds and f/u at [facility] with scheduled apt on 9/4. Red flags discussed. Pt w/o questions. -- Harriet Butte, [place], PGY-2

## 2017-03-25 ENCOUNTER — Ambulatory Visit: Payer: 59 | Admitting: Family Medicine

## 2017-03-25 NOTE — Progress Notes (Deleted)
   Subjective:   Patient ID: Jeremy [name]    DOB: 05-03-1966, 51 y.o. male   MRN: [id number]  CC: "Legs are swelling"  HPI: Jeremy [name] is a 51 y.o. male who presents to clinic today for lower extremity swelling. Problems discussed today are as follows:  LE swelling: *** ROS: ***  Complete ROS performed, see HPI for pertinent.  [place]: NIDDM complicated by CKDIII and macular edema, ED, HTN, MDD, morbid obesity. Surgical history I&D extremity. Family history DM, heart disease. Smoking status reviewed. Medications reviewed.  Objective:   There were no vitals taken for this visit. Vitals and nursing note reviewed.  General: well nourished, well developed, in no acute distress with non-toxic appearance HEENT: normocephalic, atraumatic, moist mucous membranes Neck: supple, non-tender without lymphadenopathy CV: regular rate and rhythm without murmurs, rubs, or gallops, no lower extremity edema Lungs: clear to auscultation bilaterally with normal work of breathing Abdomen: soft, non-tender, non-distended, no masses or organomegaly palpable, normoactive bowel sounds Skin: warm, dry, no rashes or lesions, cap refill < 2 seconds Extremities: warm and well perfused, normal tone  Assessment & Plan:   No problem-specific Assessment & Plan notes found for this encounter.  No orders of the defined types were placed in this encounter.  No orders of the defined types were placed in this encounter.   Harriet Butte, [place], PGY-2 03/25/2017 7:36 AM

## 2017-04-02 ENCOUNTER — Ambulatory Visit: Payer: 59 | Admitting: Family Medicine

## 2017-04-07 NOTE — Progress Notes (Signed)
   Subjective:   Patient ID: Jeremy [name]    DOB: [birth year], 51 y.o. male   MRN: [id number]  CC: "Leg swelling"  HPI: Jeremy [name] is a 51 y.o. male who presents to clinic today leg swelling. Problems discussed today are as follows:  Leg swelling: Onset 1 month ago with gradually worsening tightness in both legs with right being slightly worse than left. He states he has never experienced this before. He has kept his legs elevated with significant improvement at night. ROS: Denies chest pain, shortness of breath, cough, PND, orthopnea.  Hypertension: Patient endorses compliance with antihypertensives. He is a non-smoker.   Chronic kidney disease: Has been established at [facility] but failed to follow up. Says he "is trying to straighten himself out before he returns" by changing his diet and restricting sodas. ROS: Denies oligouria/anuria, feelings of fatigue.  Complete ROS performed, see HPI for pertinent.  [place]: NIDDM complicated by CKDIII and macular edema, ED, HTN, MDD, morbid obesity. Surgical history I&D extremity. Family history DM, heart disease. Smoking status reviewed. Medications reviewed.  Objective:   BP (!) 172/90   Pulse 94   Temp 98.4 F (36.9 C) (Oral)   Wt (!) 316 lb (143.3 kg)   SpO2 94%   BMI 48.05 kg/m  Vitals and nursing note reviewed.  General: morbidly obese, in no acute distress with non-toxic appearance HEENT: normocephalic, atraumatic, moist mucous membranes Neck: supple, non-tender without lymphadenopathy, no JVD CV: regular rate and rhythm without murmurs, rubs, or gallops Lungs: clear to auscultation bilaterally with normal work of breathing Abdomen: soft, non-tender, non-distended, normoactive bowel sounds Skin: warm, dry, no rashes or lesions, cap refill < 2 seconds Extremities: warm and well perfused, normal tone, bilateral 1+ lower extremity edema up proximal tibia with right slightly worse than left  Assessment & Plan:   CKD stage  3 due to type 2 diabetes mellitus (HCC) Chronic. Concerning for ARF given poor follow up and HTN control.  --Advised patient to contact [facility] and request follow up as soon as possible --Will check CMET  Bilateral lower extremity edema Acute. Likely 3rd spacing vs fluid overload. Suspect worsening CKD is source though HF is possibility given poor control of HTN. PE unlikely given Wells score 1 and bilateral distribution without chest pain.  --Will obatin CMET to evaluate for worsening kidney function and pro-BNP to rule out LV dysfunctional HF --Advised patient to keep leg elevated at night --Continue Lisinopril 10 mg daily, amlodipine 10 mg daily, and initiating HCTZ 25 mg daily  Primary hypertension Chronic. Uncontrolled. Currently on full strength CCB. Could increase on ACE-I though need to evaluate kidney function. WIll give trial of HCTZ for now. --Continue amlodipine 10 mg daily, lisinopril 10 mg daily, and initiating HCTZ 25 mg daily --RTC in 1 week --May need to consider diuresis with Lasix based on reponse  Orders Placed This Encounter  Procedures  . Pro b natriuretic peptide  . CMP14+EGFR   Meds ordered this encounter  Medications  . hydrochlorothiazide (HYDRODIURIL) 25 MG tablet    Sig: Take 1 tablet (25 mg total) by mouth daily.    Dispense:  90 tablet    Refill:  Clyde Hill, [place], PGY-2 04/08/2017 8:25 PM

## 2017-04-08 ENCOUNTER — Ambulatory Visit (INDEPENDENT_AMBULATORY_CARE_PROVIDER_SITE_OTHER): Payer: 59 | Admitting: Family Medicine

## 2017-04-08 ENCOUNTER — Encounter: Payer: Self-pay | Admitting: Family Medicine

## 2017-04-08 VITALS — BP 172/90 | HR 94 | Temp 98.4°F | Wt 316.0 lb

## 2017-04-08 DIAGNOSIS — I10 Essential (primary) hypertension: Secondary | ICD-10-CM

## 2017-04-08 DIAGNOSIS — R600 Localized edema: Secondary | ICD-10-CM | POA: Insufficient documentation

## 2017-04-08 DIAGNOSIS — N183 Chronic kidney disease, stage 3 unspecified: Secondary | ICD-10-CM

## 2017-04-08 DIAGNOSIS — E1122 Type 2 diabetes mellitus with diabetic chronic kidney disease: Secondary | ICD-10-CM | POA: Diagnosis not present

## 2017-04-08 MED ORDER — HYDROCHLOROTHIAZIDE 25 MG PO TABS: 25.0000 mg | ORAL_TABLET | Freq: Every day | ORAL | 3 refills | Status: DC

## 2017-04-08 NOTE — Patient Instructions (Signed)
Thank you for coming in to see [place] today. Please see below to review our plan for today's visit.  1. Your blood pressure is elevated and is likely the source of your swelling. I will start any medication called HCTZ. His important for me to follow-up with you in one week to make sure your blood pressure is responding to the medication. I will order some labs to check kidney function. 2. You need to call [facility] to schedule a follow-up appointment. If you have difficulty reaching them, please let me know. 3. Having motivation to make lifestyle changes by reducing things like sugar intake will help control her weight diabetes and likely help with your kidneys. You should also reduce the amount of salt you take to less than 2 g per day.  Please call the clinic at [phone] if your symptoms worsen or you have any concerns. It was my pleasure to see you. -- Harriet Butte, [place], PGY-2

## 2017-04-08 NOTE — Assessment & Plan Note (Signed)
Chronic. Uncontrolled. Currently on full strength CCB. Could increase on ACE-I though need to evaluate kidney function. WIll give trial of HCTZ for now. --Continue amlodipine 10 mg daily, lisinopril 10 mg daily, and initiating HCTZ 25 mg daily --RTC in 1 week --May need to consider diuresis with Lasix based on reponse

## 2017-04-08 NOTE — Assessment & Plan Note (Addendum)
Chronic. Concerning for ARF given poor follow up and HTN control.  --Advised patient to contact [facility] and request follow up as soon as possible --Will check CMET

## 2017-04-08 NOTE — Assessment & Plan Note (Addendum)
Acute. Likely 3rd spacing vs fluid overload. Suspect worsening CKD is source though HF is possibility given poor control of HTN. PE unlikely given Wells score 1 and bilateral distribution without chest pain.  --Will obatin CMET to evaluate for worsening kidney function and pro-BNP to rule out LV dysfunctional HF --Advised patient to keep leg elevated at night --Continue Lisinopril 10 mg daily, amlodipine 10 mg daily, and initiating HCTZ 25 mg daily

## 2017-04-09 ENCOUNTER — Other Ambulatory Visit: Payer: Self-pay | Admitting: Family Medicine

## 2017-04-09 DIAGNOSIS — N184 Chronic kidney disease, stage 4 (severe): Secondary | ICD-10-CM

## 2017-04-09 DIAGNOSIS — I10 Essential (primary) hypertension: Secondary | ICD-10-CM

## 2017-04-09 LAB — CMP14+EGFR
ALBUMIN: 3.9 g/dL (ref 3.5–5.5)
ALK PHOS: 92 IU/L (ref 39–117)
ALT: 17 IU/L (ref 0–44)
AST: 21 IU/L (ref 0–40)
Albumin/Globulin Ratio: 1.2 (ref 1.2–2.2)
BUN / CREAT RATIO: 11 (ref 9–20)
BUN: 34 mg/dL — AB (ref 6–24)
Bilirubin Total: 0.3 mg/dL (ref 0.0–1.2)
CALCIUM: 9 mg/dL (ref 8.7–10.2)
CO2: 23 mmol/L (ref 20–29)
CREATININE: 3.14 mg/dL — AB (ref 0.76–1.27)
Chloride: 109 mmol/L — ABNORMAL HIGH (ref 96–106)
GFR calc Af Amer: 25 mL/min/{1.73_m2} — ABNORMAL LOW (ref >59)
GFR calc non Af Amer: 22 mL/min/{1.73_m2} — ABNORMAL LOW (ref >59)
GLUCOSE: 176 mg/dL — AB (ref 65–99)
Globulin, Total: 3.2 g/dL (ref 1.5–4.5)
Potassium: 4.6 mmol/L (ref 3.5–5.2)
Sodium: 143 mmol/L (ref 134–144)
TOTAL PROTEIN: 7.1 g/dL (ref 6.0–8.5)

## 2017-04-09 LAB — PRO B NATRIURETIC PEPTIDE: NT-Pro BNP: 306 pg/mL — ABNORMAL HIGH (ref 0–121)

## 2017-04-09 MED ORDER — CARVEDILOL PHOSPHATE ER 20 MG PO CP24: 20.0000 mg | ORAL_CAPSULE | Freq: Every day | ORAL | 3 refills | Status: DC

## 2017-04-16 ENCOUNTER — Other Ambulatory Visit: Payer: Self-pay | Admitting: Family Medicine

## 2017-04-16 DIAGNOSIS — E1165 Type 2 diabetes mellitus with hyperglycemia: Principal | ICD-10-CM

## 2017-04-16 DIAGNOSIS — IMO0001 Reserved for inherently not codable concepts without codable children: Secondary | ICD-10-CM

## 2017-04-17 ENCOUNTER — Encounter: Payer: Self-pay | Admitting: Family Medicine

## 2017-04-17 ENCOUNTER — Telehealth: Payer: Self-pay | Admitting: Family Medicine

## 2017-04-17 ENCOUNTER — Ambulatory Visit (INDEPENDENT_AMBULATORY_CARE_PROVIDER_SITE_OTHER): Payer: 59 | Admitting: Family Medicine

## 2017-04-17 VITALS — BP 170/90 | HR 79 | Temp 98.8°F | Ht 68.0 in | Wt 315.0 lb

## 2017-04-17 DIAGNOSIS — I10 Essential (primary) hypertension: Secondary | ICD-10-CM

## 2017-04-17 DIAGNOSIS — R600 Localized edema: Secondary | ICD-10-CM

## 2017-04-17 DIAGNOSIS — E1169 Type 2 diabetes mellitus with other specified complication: Secondary | ICD-10-CM | POA: Diagnosis not present

## 2017-04-17 DIAGNOSIS — E782 Mixed hyperlipidemia: Secondary | ICD-10-CM

## 2017-04-17 DIAGNOSIS — Z23 Encounter for immunization: Secondary | ICD-10-CM

## 2017-04-17 LAB — POCT GLYCOSYLATED HEMOGLOBIN (HGB A1C): Hemoglobin A1C: 8.1

## 2017-04-17 MED ORDER — FUROSEMIDE 40 MG PO TABS: 40.0000 mg | ORAL_TABLET | Freq: Every day | ORAL | 3 refills | Status: DC

## 2017-04-17 NOTE — Assessment & Plan Note (Addendum)
Chronic. Uncontrolled. Now in 3 separate antihypertensives though not max on dosage. Pt endorses compliance. Suspect pt would benefit from diuresis though no sign of fluid overload. Wt has gradually increased over year. --Continue current HTN regimen but will add Lasix 40 mg daily

## 2017-04-17 NOTE — Assessment & Plan Note (Addendum)
Chronic. Stable since last visit. Suspect worsening renal disease. Needs diuresis. --See HTN plan --Established f/u with nephrology on 10/2 prior to pt leaving office

## 2017-04-17 NOTE — Progress Notes (Signed)
   Subjective:   Patient ID: Jeremy [name]    DOB: 1965/12/22, 51 y.o. male   MRN: [id number]  CC: "Leg swelling"  HPI: [name] is a 51 y.o. male who presents to clinic today leg swelling. Problems discussed today are as follows:  Leg swelling: Stable since last visit. Has been taking BP meds as instructed tolerating Carvedilol well. BP remains elevated. Patient has not contacted nephrology as instructed because "he wanted to get his legs straighten out first, then he would take care of his kidneys." ROS: Denies chest pain, shortness of breath, palpitations, orthopnea, leg pain.  Hypertension: Has been taking new prescription for Carvedilol. Making efforts to reduce amount of soda he consumes.  Complete ROS performed, see HPI for pertinent.  [place]: NIDDM complicated by CKDIII and macular edema, ED, HTN, MDD, morbid obesity. Surgical history I&D extremity. Family history DM, heart disease. Smoking status reviewed. Medications reviewed.  Objective:   BP (!) 170/90   Pulse 79   Temp 98.8 F (37.1 C) (Oral)   Ht '5\' 8"'$  (1.727 m)   Wt (!) 315 lb (142.9 kg)   SpO2 99%   BMI 47.90 kg/m  Vitals and nursing note reviewed.  General: morbidly obese, in no acute distress with non-toxic appearance HEENT: normocephalic, atraumatic, moist mucous membranes Neck: supple, non-tender without lymphadenopathy, no JVD CV: regular rate and rhythm without murmurs, rubs, or gallops, no lower extremity edema Lungs: clear to auscultation bilaterally with normal work of breathing Skin: warm, dry, no rashes or lesions, cap refill < 2 seconds Extremities: warm and well perfused, normal tone, 1+ non-pitting edema b/l  Assessment & Plan:   Bilateral lower extremity edema Chronic. Stable since last visit. Suspect worsening renal disease. Needs diuresis. --See HTN plan --Established f/u with nephrology on 10/2 prior to pt leaving office  Primary hypertension Chronic. Uncontrolled. Now in 3 separate  antihypertensives though not max on dosage. Pt endorses compliance. Suspect pt would benefit from diuresis though no sign of fluid overload. Wt has gradually increased over year. --Continue current HTN regimen but will add Lasix 40 mg daily  Orders Placed This Encounter  Procedures  . Flu Vaccine QUAD 36+ mos IM  . CMP14+EGFR  . POCT glycosylated hemoglobin (Hb A1C)   Meds ordered this encounter  Medications  . furosemide (LASIX) 40 MG tablet    Sig: Take 1 tablet (40 mg total) by mouth daily.    Dispense:  30 tablet    Refill:  [place], [place], PGY-2 04/18/2017 3:07 PM

## 2017-04-17 NOTE — Telephone Encounter (Signed)
Error

## 2017-04-17 NOTE — Patient Instructions (Signed)
Thank you for coming in to see [place] today. Please see below to review our plan for today's visit.  Stop your metformin today. I started you on any medication called Lasix which she will take once daily. Make sure you do not miss this follow-up appointment with the kidney doctor. I would like to see you in 2 weeks to reassess your blood pressure and kidney status.  Please call the clinic at [phone] if your symptoms worsen or you have any concerns. It was my pleasure to see you. -- Harriet Butte, [place], PGY-2

## 2017-04-18 LAB — CMP14+EGFR
ALBUMIN: 4 g/dL (ref 3.5–5.5)
ALK PHOS: 87 IU/L (ref 39–117)
ALT: 22 IU/L (ref 0–44)
AST: 31 IU/L (ref 0–40)
Albumin/Globulin Ratio: 1.3 (ref 1.2–2.2)
BILIRUBIN TOTAL: 0.3 mg/dL (ref 0.0–1.2)
BUN / CREAT RATIO: 12 (ref 9–20)
BUN: 34 mg/dL — AB (ref 6–24)
CO2: 19 mmol/L — ABNORMAL LOW (ref 20–29)
CREATININE: 2.89 mg/dL — AB (ref 0.76–1.27)
Calcium: 8.7 mg/dL (ref 8.7–10.2)
Chloride: 107 mmol/L — ABNORMAL HIGH (ref 96–106)
GFR calc non Af Amer: 24 mL/min/{1.73_m2} — ABNORMAL LOW (ref >59)
GFR, EST AFRICAN AMERICAN: 28 mL/min/{1.73_m2} — AB (ref >59)
GLOBULIN, TOTAL: 3.2 g/dL (ref 1.5–4.5)
GLUCOSE: 97 mg/dL (ref 65–99)
POTASSIUM: 4.5 mmol/L (ref 3.5–5.2)
SODIUM: 141 mmol/L (ref 134–144)
TOTAL PROTEIN: 7.2 g/dL (ref 6.0–8.5)

## 2017-04-22 ENCOUNTER — Ambulatory Visit (INDEPENDENT_AMBULATORY_CARE_PROVIDER_SITE_OTHER): Payer: 59 | Admitting: Internal Medicine

## 2017-04-22 ENCOUNTER — Encounter: Payer: Self-pay | Admitting: Internal Medicine

## 2017-04-22 DIAGNOSIS — M545 Low back pain, unspecified: Secondary | ICD-10-CM

## 2017-04-22 DIAGNOSIS — M549 Dorsalgia, unspecified: Secondary | ICD-10-CM | POA: Insufficient documentation

## 2017-04-22 MED ORDER — CYCLOBENZAPRINE HCL 5 MG PO TABS: 5.0000 mg | ORAL_TABLET | Freq: Three times a day (TID) | ORAL | 0 refills | Status: DC | PRN

## 2017-04-22 NOTE — Assessment & Plan Note (Signed)
Likely MSK etiology 2/2 sudden activity change (began stretching exercises including trying to touch his toes). Improvement with naproxen, however patient with Stage IV CKD, so not appropriate to continue NSAIDs. No red flags, and full strength in LE bilaterally. No bony abnormalities on exam.  - Tylenol rather than NSAIDs. Discussed with patient that continuing to take naproxen as well as ibuprofen can worsen his kidney function and should be avoided. Also discussed taking Tylenol no more frequently than q6hrs.  - Flexeril #15 with no refills TID PRN - Heating pad PRN - Encouraged remaining as active as possible - Gave note to excuse patient from heavy lifting at work for the next week

## 2017-04-22 NOTE — Progress Notes (Signed)
   Subjective:   Patient: Jeremy [name]       Birthdate: 1966-02-01       MRN: [id number]      HPI  Coreyon Nicotra is a 51 y.o. male presenting for same day appointment for back pain.   Present for a little over a week. Began after he tried to do some exercises for two mornings in a row which included bending down to touch his toes. Describes pain as sharp and located in R lumbar region. Pain is worse with movement and positioning. Can feel pain when moving his leg. Has been taking Naproxen which helps. Denies incontinence, saddle anesthesia, numbness, weakness. Has been able to go to work, though has had worsening pain with heavy lifting at work. Pain is worse with bending forward as well.   Smoking status reviewed. Patient is never smoker.   Review of Systems See HPI.     Objective:  Physical Exam  Constitutional: He is oriented to person, place, and time and well-developed, well-nourished, and in no distress.  HENT:  Head: Normocephalic and atraumatic.  Pulmonary/Chest: Effort normal. No respiratory distress.  Musculoskeletal:  TTP of R lumbar region. Pain with back flexion. Full ROM otherwise (extension, twisting, etc). Stands up slowly, but is able to walk, sit, and stand without assistance. 5/5 strength lower extremities bilaterally.  Neurological: He is alert and oriented to person, place, and time.  Skin: Skin is warm and dry.  Psychiatric: Affect and judgment normal.     Assessment & Plan:  Back pain Likely MSK etiology 2/2 sudden activity change (began stretching exercises including trying to touch his toes). Improvement with naproxen, however patient with Stage IV CKD, so not appropriate to continue NSAIDs. No red flags, and full strength in LE bilaterally. No bony abnormalities on exam.  - Tylenol rather than NSAIDs. Discussed with patient that continuing to take naproxen as well as ibuprofen can worsen his kidney function and should be avoided. Also discussed taking Tylenol  no more frequently than q6hrs.  - Flexeril #15 with no refills TID PRN - Heating pad PRN - Encouraged remaining as active as possible - Gave note to excuse patient from heavy lifting at work for the next week   Adin Hector, MD, MPH PGY-3 Page Medicine Pager [phone]

## 2017-04-22 NOTE — Patient Instructions (Addendum)
It was nice meeting you today Jeremy [name]!  Please begin taking Tylenol 650mg  every 6 hours to help with your back pain. Do not take more frequently than every 6 hours, as this can injure your kidneys. DO NOT TAKE NAPROXEN, IBUPROFEN, OR SIMILAR MEDICATIONS (Advil, Motrin, etc). THESE WILL CAUSE MORE KIDNEY DAMAGE.   If you still have pain about an hour after taking Tylenol, you can take one Flexeril tablet up to every 8 hours as needed. This may make you sleepy.   You can put a heating pad on your back to help with the pain as well. It is also important to stay active as much as possible.   If you have any questions or concerns, please feel free to call the clinic.   Be well,  Dr. Avon Gully   Back Pain, Adult Back pain is very common. The pain often gets better over time. The cause of back pain is usually not dangerous. Most people can learn to manage their back pain on their own. Follow these instructions at home: Watch your back pain for any changes. The following actions may help to lessen any pain you are feeling:  Stay active. Start with short walks on flat ground if you can. Try to walk farther each day.  Exercise regularly as told by your doctor. Exercise helps your back heal faster. It also helps avoid future injury by keeping your muscles strong and flexible.  Do not sit, drive, or stand in one place for more than 30 minutes.  Do not stay in bed. Resting more than 1-2 days can slow down your recovery.  Be careful when you bend or lift an object. Use good form when lifting: ? Bend at your knees. ? Keep the object close to your body. ? Do not twist.  Sleep on a firm mattress. Lie on your side, and bend your knees. If you lie on your back, put a pillow under your knees.  Take medicines only as told by your doctor.  Put ice on the injured area. ? Put ice in a plastic bag. ? Place a towel between your skin and the bag. ? Leave the ice on for 20 minutes, 2-3 times a day for  the first 2-3 days. After that, you can switch between ice and heat packs.  Avoid feeling anxious or stressed. Find good ways to deal with stress, such as exercise.  Maintain a healthy weight. Extra weight puts stress on your back.  Contact a doctor if:  You have pain that does not go away with rest or medicine.  You have worsening pain that goes down into your legs or buttocks.  You have pain that does not get better in one week.  You have pain at night.  You lose weight.  You have a fever or chills. Get help right away if:  You cannot control when you poop (bowel movement) or pee (urinate).  Your arms or legs feel weak.  Your arms or legs lose feeling (numbness).  You feel sick to your stomach (nauseous) or throw up (vomit).  You have belly (abdominal) pain.  You feel like you may pass out (faint). This information is not intended to replace advice given to you by your health care provider. Make sure you discuss any questions you have with your health care provider. Document Released: 12/25/2007 Document Revised: 12/14/2015 Document Reviewed: 11/09/2013 Elsevier Interactive Patient Education  [employer].

## 2017-04-25 ENCOUNTER — Telehealth: Payer: Self-pay | Admitting: Family Medicine

## 2017-04-25 NOTE — Telephone Encounter (Signed)
Pt called because he wants the doctor to make him a referral to the kidney doctor? At least that's what it sounded like. He said that he called their office and that they can not see him until November. Please call patient to discuss., jw

## 2017-04-25 NOTE — Telephone Encounter (Signed)
Patient was aware of appointment on 10/2, however he missed his appointment for a same day appointment for acute back pain. Unfortunately, he needs to wait until their next available appointment. He does not need a referral at this time.

## 2017-05-02 ENCOUNTER — Ambulatory Visit (INDEPENDENT_AMBULATORY_CARE_PROVIDER_SITE_OTHER): Payer: 59 | Admitting: Family Medicine

## 2017-05-02 ENCOUNTER — Encounter: Payer: Self-pay | Admitting: Family Medicine

## 2017-05-02 ENCOUNTER — Ambulatory Visit: Payer: 59 | Admitting: Family Medicine

## 2017-05-02 VITALS — BP 148/98 | HR 78 | Temp 98.3°F | Wt 308.6 lb

## 2017-05-02 DIAGNOSIS — I10 Essential (primary) hypertension: Secondary | ICD-10-CM | POA: Diagnosis not present

## 2017-05-02 DIAGNOSIS — M545 Low back pain, unspecified: Secondary | ICD-10-CM

## 2017-05-02 DIAGNOSIS — R600 Localized edema: Secondary | ICD-10-CM | POA: Diagnosis not present

## 2017-05-02 MED ORDER — CARVEDILOL PHOSPHATE ER 20 MG PO CP24: 40.0000 mg | ORAL_CAPSULE | Freq: Every day | ORAL | 3 refills | Status: DC

## 2017-05-02 NOTE — Assessment & Plan Note (Signed)
Chronic. Suboptimal. Much improved from baseline by history. Patient endorsing compliance. BP 148/98. HR 78. No signs of fluid retention. --Increasing Coreg CR to 40 mg daily --Continue lisinopril 10 mg daily, amlodipine 10 mg daily, Lasix 40 mg daily

## 2017-05-02 NOTE — Patient Instructions (Addendum)
Thank you for coming in to see [place] today. Please see below to review our plan for today's visit.   1. Continue taking the Lasix 40 mg daily. 2. I would like you to increase your Coreg CR to 40 mg daily (double your daily dose). 3. Stable way from the salt shaker. Make efforts to choose foods on the menu with low-salt. 4. Do not take Advil, ibuprofen, Aleve, Motrin as this will worsen your kidney function. You can take Tylenol for your back pain as needed, use a hot pad, or use topical over-the-counter medications like icy hot.   Please call the clinic at [phone] if your symptoms worsen or you have any concerns. It was my pleasure to see you. -- Harriet Butte, [place], PGY-2

## 2017-05-02 NOTE — Assessment & Plan Note (Addendum)
Acute. Improved. Likely from muscle strain due to improvement with use of NSAIDs. No red flags. --Advised patient to avoid NSAID use given his worsening kidney function and to rather use Tylenol, heating pad, or topical OTC back pain medication

## 2017-05-02 NOTE — Progress Notes (Signed)
   Subjective:   Patient ID: Jeremy [name]    DOB: 11-16-1965, 51 y.o. male   MRN: [id number]  CC: "High blood pressure follow-up"  HPI: Jeremy [name] is a 51 y.o. male who presents to clinic today for the following:  Hypertension: Patient compliant with use of antihypertensives including Lasix 40 mg daily. He states he has not missed a dose this week. He states he took his medications this morning.  Lower extremity swelling: Patient is please with the results since starting the Lasix. He states his swelling has significantly reduced after 2 days of using Lasix 40 mg daily. He states he "feels much better today." ROS: Denies fevers or chills, nausea or vomiting, chest pain, shortness of breath, palpitations, decreased motor function or loss of sensation.  Back pain: Improving. Patient did take 3 tablets of Aleve 3 days ago. Symptoms resolved. Patient has avoided NSAIDs since.  Complete ROS performed, see HPI for pertinent.  [place]: NIDDM complicated by CKDIII and macular edema, ED, HTN, MDD, morbid obesity. Surgical history I&D extremity. Family history DM, heart disease. Smoking status reviewed. Medications reviewed.  Objective:   BP (!) 148/98   Pulse 78   Temp 98.3 F (36.8 C) (Oral)   Wt (!) 308 lb 9.6 oz (140 kg)   SpO2 96%   BMI 46.92 kg/m  Vitals and nursing note reviewed.  General: well nourished, well developed, in no acute distress with non-toxic appearance HEENT: normocephalic, atraumatic, moist mucous membranes Neck: supple, non-tender without lymphadenopathy, no JVD CV: regular rate and rhythm without murmurs, rubs, or gallops, minimal bilateral lower extremity edema Lungs: clear to auscultation bilaterally with normal work of breathing Skin: warm, dry, no rashes or lesions, cap refill < 2 seconds Extremities: warm and well perfused, normal tone  Assessment & Plan:   Back pain Acute. Improved. Likely from muscle strain due to improvement with use of NSAIDs. No red  flags. --Advised patient to avoid NSAID use given his worsening kidney function and to rather use Tylenol, heating pad, or topical OTC back pain medication  Bilateral lower extremity edema Chronic. Improved with use of Lasix. Likely secondary to worsening chronic kidney disease. Patient has appointment with nephrology on 05/27/2017. --Recommended patient continue Lasix 40 mg daily --Discussed importance of avoiding salt with restrictions to less than 2000 mg daily  Primary hypertension Chronic. Suboptimal. Much improved from baseline by history. Patient endorsing compliance. BP 148/98. HR 78. No signs of fluid retention. --Increasing Coreg CR to 40 mg daily --Continue lisinopril 10 mg daily, amlodipine 10 mg daily, Lasix 40 mg daily  No orders of the defined types were placed in this encounter.  Meds ordered this encounter  Medications  . carvedilol (COREG CR) 20 MG 24 hr capsule    Sig: Take 2 capsules (40 mg total) by mouth daily.    Dispense:  90 capsule    Refill:  [place], [place], PGY-2 05/02/2017 4:03 PM

## 2017-05-02 NOTE — Assessment & Plan Note (Addendum)
Chronic. Improved with use of Lasix. Likely secondary to worsening chronic kidney disease. Patient has appointment with nephrology on 05/27/2017. --Recommended patient continue Lasix 40 mg daily --Discussed importance of avoiding salt with restrictions to less than 2000 mg daily

## 2017-05-02 NOTE — Telephone Encounter (Signed)
Patient is scheduled today to see provider. Jazmin Hartsell,CMA

## 2017-05-16 ENCOUNTER — Ambulatory Visit (INDEPENDENT_AMBULATORY_CARE_PROVIDER_SITE_OTHER): Payer: 59 | Admitting: *Deleted

## 2017-05-16 VITALS — BP 192/108 | HR 73

## 2017-05-16 DIAGNOSIS — I10 Essential (primary) hypertension: Secondary | ICD-10-CM

## 2017-05-16 DIAGNOSIS — Z0130 Encounter for examination of blood pressure without abnormal findings: Secondary | ICD-10-CM

## 2017-05-16 NOTE — Progress Notes (Signed)
   Patient presents for BP check after increasing carvedilol to 2 tabs every other day. Unable to take 2 tabs daily due to running low on pills. Only took 1 tab carvedilol 2 hours ago.  Discussed need for low sodium diet and using Mrs. Dash as alternative to salt. Encouraged to choose foods with 5% or less of daily value for sodium. Patient given literature on DASH Eating Plan  Patient denies headaches, blurred vision, SHOB, chest pain. States woke up sweating and knew BP would be high. Does not have home BP monitor. Also states sweating when eating.  Discussed physical activity. 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  [facility] did not receive new order for carvedilol on 05/02/2017. Verbal order given to Sameer  Patient advised to call for med refills at least 7 days before running out so as not to go without.  Hubbard Hartshorn, RN, BSN

## 2017-05-16 NOTE — Patient Instructions (Signed)
DASH Eating Plan DASH stands for "Dietary Approaches to Stop Hypertension." The DASH eating plan is a healthy eating plan that has been shown to reduce high blood pressure (hypertension). It may also reduce your risk for type 2 diabetes, heart disease, and stroke. The DASH eating plan may also help with weight loss. What are tips for following this plan? General guidelines  Avoid eating more than 2,300 mg (milligrams) of salt (sodium) a day. If you have hypertension, you may need to reduce your sodium intake to 1,500 mg a day.  Limit alcohol intake to no more than 1 drink a day for nonpregnant women and 2 drinks a day for men. One drink equals 12 oz of beer, 5 oz of wine, or 1 oz of hard liquor.  Work with your health care provider to maintain a healthy body weight or to lose weight. Ask what an ideal weight is for you.  Get at least 30 minutes of exercise that causes your heart to beat faster (aerobic exercise) most days of the week. Activities may include walking, swimming, or biking.  Work with your health care provider or diet and nutrition specialist (dietitian) to adjust your eating plan to your individual calorie needs. Reading food labels  Check food labels for the amount of sodium per serving. Choose foods with less than 5 percent of the Daily Value of sodium. Generally, foods with less than 300 mg of sodium per serving fit into this eating plan.  To find whole grains, look for the word "whole" as the first word in the ingredient list. Shopping  Buy products labeled as "low-sodium" or "no salt added."  Buy fresh foods. Avoid canned foods and premade or frozen meals. Cooking  Avoid adding salt when cooking. Use salt-free seasonings or herbs instead of table salt or sea salt. Check with your health care provider or pharmacist before using salt substitutes.  Do not fry foods. Cook foods using healthy methods such as baking, boiling, grilling, and broiling instead.  Cook with  heart-healthy oils, such as olive, canola, soybean, or sunflower oil. Meal planning   Eat a balanced diet that includes: ? 5 or more servings of fruits and vegetables each day. At each meal, try to fill half of your plate with fruits and vegetables. ? Up to 6-8 servings of whole grains each day. ? Less than 6 oz of lean meat, poultry, or fish each day. A 3-oz serving of meat is about the same size as a deck of cards. One egg equals 1 oz. ? 2 servings of low-fat dairy each day. ? A serving of nuts, seeds, or beans 5 times each week. ? Heart-healthy fats. Healthy fats called Omega-3 fatty acids are found in foods such as flaxseeds and coldwater fish, like sardines, salmon, and mackerel.  Limit how much you eat of the following: ? Canned or prepackaged foods. ? Food that is high in trans fat, such as fried foods. ? Food that is high in saturated fat, such as fatty meat. ? Sweets, desserts, sugary drinks, and other foods with added sugar. ? Full-fat dairy products.  Do not salt foods before eating.  Try to eat at least 2 vegetarian meals each week.  Eat more home-cooked food and less restaurant, buffet, and fast food.  When eating at a restaurant, ask that your food be prepared with less salt or no salt, if possible. What foods are recommended? The items listed may not be a complete list. Talk with your dietitian about what   dietary choices are best for you. Grains Whole-grain or whole-wheat bread. Whole-grain or whole-wheat pasta. Brown rice. Oatmeal. Quinoa. Bulgur. Whole-grain and low-sodium cereals. Pita bread. Low-fat, low-sodium crackers. Whole-wheat flour tortillas. Vegetables Fresh or frozen vegetables (raw, steamed, roasted, or grilled). Low-sodium or reduced-sodium tomato and vegetable juice. Low-sodium or reduced-sodium tomato sauce and tomato paste. Low-sodium or reduced-sodium canned vegetables. Fruits All fresh, dried, or frozen fruit. Canned fruit in natural juice (without  added sugar). Meat and other protein foods Skinless chicken or turkey. Ground chicken or turkey. Pork with fat trimmed off. Fish and seafood. Egg whites. Dried beans, peas, or lentils. Unsalted nuts, nut butters, and seeds. Unsalted canned beans. Lean cuts of beef with fat trimmed off. Low-sodium, lean deli meat. Dairy Low-fat (1%) or fat-free (skim) milk. Fat-free, low-fat, or reduced-fat cheeses. Nonfat, low-sodium ricotta or cottage cheese. Low-fat or nonfat yogurt. Low-fat, low-sodium cheese. Fats and oils Soft margarine without trans fats. Vegetable oil. Low-fat, reduced-fat, or light mayonnaise and salad dressings (reduced-sodium). Canola, safflower, olive, soybean, and sunflower oils. Avocado. Seasoning and other foods Herbs. Spices. Seasoning mixes without salt. Unsalted popcorn and pretzels. Fat-free sweets. What foods are not recommended? The items listed may not be a complete list. Talk with your dietitian about what dietary choices are best for you. Grains Baked goods made with fat, such as croissants, muffins, or some breads. Dry pasta or rice meal packs. Vegetables Creamed or fried vegetables. Vegetables in a cheese sauce. Regular canned vegetables (not low-sodium or reduced-sodium). Regular canned tomato sauce and paste (not low-sodium or reduced-sodium). Regular tomato and vegetable juice (not low-sodium or reduced-sodium). Pickles. Olives. Fruits Canned fruit in a light or heavy syrup. Fried fruit. Fruit in cream or butter sauce. Meat and other protein foods Fatty cuts of meat. Ribs. Fried meat. Bacon. Sausage. Bologna and other processed lunch meats. Salami. Fatback. Hotdogs. Bratwurst. Salted nuts and seeds. Canned beans with added salt. Canned or smoked fish. Whole eggs or egg yolks. Chicken or turkey with skin. Dairy Whole or 2% milk, cream, and half-and-half. Whole or full-fat cream cheese. Whole-fat or sweetened yogurt. Full-fat cheese. Nondairy creamers. Whipped toppings.  Processed cheese and cheese spreads. Fats and oils Butter. Stick margarine. Lard. Shortening. Ghee. Bacon fat. Tropical oils, such as coconut, palm kernel, or palm oil. Seasoning and other foods Salted popcorn and pretzels. Onion salt, garlic salt, seasoned salt, table salt, and sea salt. Worcestershire sauce. Tartar sauce. Barbecue sauce. Teriyaki sauce. Soy sauce, including reduced-sodium. Steak sauce. Canned and packaged gravies. Fish sauce. Oyster sauce. Cocktail sauce. Horseradish that you find on the shelf. Ketchup. Mustard. Meat flavorings and tenderizers. Bouillon cubes. Hot sauce and Tabasco sauce. Premade or packaged marinades. Premade or packaged taco seasonings. Relishes. Regular salad dressings. Where to find more information:  National Heart, Lung, and Blood Institute: www.nhlbi.nih.gov  American Heart Association: www.heart.org Summary  The DASH eating plan is a healthy eating plan that has been shown to reduce high blood pressure (hypertension). It may also reduce your risk for type 2 diabetes, heart disease, and stroke.  With the DASH eating plan, you should limit salt (sodium) intake to 2,300 mg a day. If you have hypertension, you may need to reduce your sodium intake to 1,500 mg a day.  When on the DASH eating plan, aim to eat more fresh fruits and vegetables, whole grains, lean proteins, low-fat dairy, and heart-healthy fats.  Work with your health care provider or diet and nutrition specialist (dietitian) to adjust your eating plan to your individual   calorie needs. This information is not intended to replace advice given to you by your health care provider. Make sure you discuss any questions you have with your health care provider. Document Released: 06/27/2011 Document Revised: 07/01/2016 Document Reviewed: 07/01/2016 Elsevier Interactive Patient Education  2017 Elsevier Inc.  

## 2017-05-27 DIAGNOSIS — D472 Monoclonal gammopathy: Secondary | ICD-10-CM | POA: Diagnosis not present

## 2017-05-27 DIAGNOSIS — N184 Chronic kidney disease, stage 4 (severe): Secondary | ICD-10-CM | POA: Diagnosis not present

## 2017-05-27 DIAGNOSIS — N183 Chronic kidney disease, stage 3 (moderate): Secondary | ICD-10-CM | POA: Diagnosis not present

## 2017-05-27 DIAGNOSIS — I10 Essential (primary) hypertension: Secondary | ICD-10-CM | POA: Diagnosis not present

## 2017-06-15 NOTE — Progress Notes (Deleted)
   Subjective   Patient ID: Jeremy [name]    DOB: [birth year], 51 y.o. male   MRN: [id number]  CC: "***"  HPI: Jeremy [name] is a 51 y.o. male who presents to clinic today for the following:  ***: ***  ***Last seen 10/12 for HTN f/u. Was taking NSAIDS for back pain and told to stop. Continue lasix 40 daily. Increased coreg to 40 due to uncontrolled HTN. Was seen for AWV 10/26 with BP 192/108. Updates include negative renal US. Nephrology?  ROS: see HPI for pertinent.  [place]: NIDDM complicated by CKDIII and macular edema, ED, HTN, MDD, morbid obesity. Surgical history I&D extremity. Family history DM, heart disease. Smoking status reviewed. Medications reviewed.  Objective   There were no vitals taken for this visit. Vitals and nursing note reviewed.  General: well nourished, well developed, NAD with non-toxic appearance HEENT: normocephalic, atraumatic, moist mucous membranes Neck: supple, non-tender without lymphadenopathy Cardiovascular: regular rate and rhythm without murmurs, rubs, or gallops Lungs: clear to auscultation bilaterally with normal work of breathing Abdomen: soft, non-tender, non-distended, normoactive bowel sounds Skin: warm, dry, no rashes or lesions, cap refill < 2 seconds Extremities: warm and well perfused, normal tone, no edema  Assessment & Plan   No problem-specific Assessment & Plan notes found for this encounter.  No orders of the defined types were placed in this encounter.  No orders of the defined types were placed in this encounter.   Harriet Butte, [place], PGY-2 06/15/2017, 8:21 PM

## 2017-06-16 ENCOUNTER — Ambulatory Visit: Payer: 59 | Admitting: Family Medicine

## 2017-08-04 ENCOUNTER — Inpatient Hospital Stay (HOSPITAL_COMMUNITY)
Admission: EM | Admit: 2017-08-04 | Discharge: 2017-08-08 | DRG: 638 | Disposition: A | Discharge status: Home / Self Care | Payer: 59 | Attending: Family Medicine | Admitting: Family Medicine

## 2017-08-04 ENCOUNTER — Encounter (HOSPITAL_COMMUNITY): Payer: Self-pay | Admitting: Emergency Medicine

## 2017-08-04 DIAGNOSIS — I160 Hypertensive urgency: Secondary | ICD-10-CM | POA: Diagnosis present

## 2017-08-04 DIAGNOSIS — R5601 Complex febrile convulsions: Secondary | ICD-10-CM | POA: Diagnosis not present

## 2017-08-04 DIAGNOSIS — E1121 Type 2 diabetes mellitus with diabetic nephropathy: Secondary | ICD-10-CM | POA: Diagnosis present

## 2017-08-04 DIAGNOSIS — E870 Hyperosmolality and hypernatremia: Secondary | ICD-10-CM | POA: Diagnosis present

## 2017-08-04 DIAGNOSIS — E1165 Type 2 diabetes mellitus with hyperglycemia: Secondary | ICD-10-CM | POA: Diagnosis present

## 2017-08-04 DIAGNOSIS — R569 Unspecified convulsions: Secondary | ICD-10-CM

## 2017-08-04 DIAGNOSIS — E669 Obesity, unspecified: Secondary | ICD-10-CM

## 2017-08-04 DIAGNOSIS — E860 Dehydration: Secondary | ICD-10-CM | POA: Diagnosis present

## 2017-08-04 DIAGNOSIS — Z794 Long term (current) use of insulin: Secondary | ICD-10-CM

## 2017-08-04 DIAGNOSIS — IMO0002 Reserved for concepts with insufficient information to code with codable children: Secondary | ICD-10-CM | POA: Diagnosis present

## 2017-08-04 DIAGNOSIS — I130 Hypertensive heart and chronic kidney disease with heart failure and stage 1 through stage 4 chronic kidney disease, or unspecified chronic kidney disease: Secondary | ICD-10-CM | POA: Diagnosis present

## 2017-08-04 DIAGNOSIS — E782 Mixed hyperlipidemia: Secondary | ICD-10-CM | POA: Diagnosis present

## 2017-08-04 DIAGNOSIS — Z79899 Other long term (current) drug therapy: Secondary | ICD-10-CM

## 2017-08-04 DIAGNOSIS — E1100 Type 2 diabetes mellitus with hyperosmolarity without nonketotic hyperglycemic-hyperosmolar coma (NKHHC): Secondary | ICD-10-CM | POA: Diagnosis not present

## 2017-08-04 DIAGNOSIS — I129 Hypertensive chronic kidney disease with stage 1 through stage 4 chronic kidney disease, or unspecified chronic kidney disease: Secondary | ICD-10-CM | POA: Diagnosis present

## 2017-08-04 DIAGNOSIS — Z833 Family history of diabetes mellitus: Secondary | ICD-10-CM

## 2017-08-04 DIAGNOSIS — Z9114 Patient's other noncompliance with medication regimen: Secondary | ICD-10-CM

## 2017-08-04 DIAGNOSIS — R739 Hyperglycemia, unspecified: Secondary | ICD-10-CM | POA: Diagnosis present

## 2017-08-04 DIAGNOSIS — E1122 Type 2 diabetes mellitus with diabetic chronic kidney disease: Secondary | ICD-10-CM | POA: Diagnosis present

## 2017-08-04 DIAGNOSIS — I5040 Unspecified combined systolic (congestive) and diastolic (congestive) heart failure: Secondary | ICD-10-CM | POA: Diagnosis present

## 2017-08-04 DIAGNOSIS — N179 Acute kidney failure, unspecified: Secondary | ICD-10-CM | POA: Diagnosis not present

## 2017-08-04 DIAGNOSIS — D509 Iron deficiency anemia, unspecified: Secondary | ICD-10-CM

## 2017-08-04 DIAGNOSIS — E861 Hypovolemia: Secondary | ICD-10-CM | POA: Diagnosis present

## 2017-08-04 DIAGNOSIS — Z6841 Body Mass Index (BMI) 40.0 and over, adult: Secondary | ICD-10-CM

## 2017-08-04 DIAGNOSIS — R600 Localized edema: Secondary | ICD-10-CM | POA: Diagnosis present

## 2017-08-04 DIAGNOSIS — N184 Chronic kidney disease, stage 4 (severe): Secondary | ICD-10-CM | POA: Diagnosis present

## 2017-08-04 DIAGNOSIS — E1169 Type 2 diabetes mellitus with other specified complication: Secondary | ICD-10-CM | POA: Diagnosis present

## 2017-08-04 HISTORY — DX: Chronic kidney disease, unspecified: N18.9

## 2017-08-04 HISTORY — DX: Unspecified convulsions: R56.9

## 2017-08-04 HISTORY — DX: Type 2 diabetes mellitus without complications: E11.9

## 2017-08-04 HISTORY — DX: Pure hypercholesterolemia, unspecified: E78.00

## 2017-08-04 HISTORY — DX: Acute kidney failure, unspecified: N17.9

## 2017-08-04 LAB — I-STAT VENOUS BLOOD GAS, ED
Bicarbonate: 26.6 mmol/L (ref 20.0–28.0)
O2 Saturation: 72 %
PO2 VEN: 40 mmHg (ref 32.0–45.0)
TCO2: 28 mmol/L (ref 22–32)
pCO2, Ven: 47.5 mmHg (ref 44.0–60.0)
pH, Ven: 7.355 (ref 7.250–7.430)

## 2017-08-04 LAB — I-STAT CG4 LACTIC ACID, ED: Lactic Acid, Venous: 2.73 mmol/L (ref 0.5–1.9)

## 2017-08-04 LAB — CBG MONITORING, ED: Glucose-Capillary: 547 mg/dL (ref 65–99)

## 2017-08-04 MED ORDER — SODIUM CHLORIDE 0.9 % IV BOLUS (SEPSIS)
1000.0000 mL | Freq: Once | INTRAVENOUS | Status: AC
  Administered 2017-08-05: 1000 mL via INTRAVENOUS

## 2017-08-04 NOTE — ED Provider Notes (Addendum)
[facility] EMERGENCY DEPARTMENT Provider Note   CSN: [id number] Arrival date & time: 08/04/17  2322     History   Chief Complaint Chief Complaint  Patient presents with  . Hyperglycemia  . Seizures    HPI Jeremy [name] is a 52 y.o. male.  The history is provided by the spouse. The history is limited by the condition of the patient (Altered mental status).  He has a history of hypertension, diabetes, Bell's palsy, renal insufficiency and comes in with possible seizure.  His wife came home and noted that he was sleeping.  Later, he woke up and she noted that he was experiencing some twitching of his arms and legs.  She thinks it lasted for about 15 minutes.  There was no incontinence and no bit lip or tongue.  She helped him to the bathroom, but he was very unsteady on his feet and was not able to urinate in the commode.  He then started twitching again.  He was coherent enough to tell her that he did not want her to call 911.  She states that, as far she knows, he has been compliant with his medications.  He had been taken off of insulin and is just on pills.  Past Medical History:  Diagnosis Date  . Abscess   . Bell's palsy   . Diabetes mellitus   . Hypertension   . Left shoulder pain 10/26/2013   S/p injection on 10/26/13   . Renal insufficiency 02/14/2014    Patient Active Problem List   Diagnosis Date Noted  . Back pain 04/22/2017  . Bilateral lower extremity edema 04/08/2017  . Diabetes mellitus type 2, uncontrolled ([place]) 03/03/2017  . Chronic kidney disease (CKD), stage IV (severe) ([place]) 02/14/2014  . Rupture of right biceps tendon 12/07/2013  . History of Bell's palsy with residual right eye ptosis 06/08/2013  . Morbid obesity ([place]) 05/20/2013  . Erectile dysfunction associated with type 2 diabetes mellitus ([place]) 06/02/2012  . Macular edema 12/12/2011  . Primary hypertension 11/15/2011  . Mixed hyperlipidemia due to type 2 diabetes mellitus ([place])  11/11/2011    Past Surgical History:  Procedure Laterality Date  . I&D EXTREMITY  11/11/2011   Procedure: IRRIGATION AND DEBRIDEMENT EXTREMITY;  Surgeon: Merrie Roof, MD;  Location: [facility];  Service: General;  Laterality: Right;  I&D Right Thigh Abscess       Home Medications    Prior to Admission medications   Medication Sig Start Date End Date Taking? Authorizing Provider  amLODipine (NORVASC) 10 MG tablet Take 1 tablet (10 mg total) by mouth daily. 01/31/17   [place] Bing, DO  atorvastatin (LIPITOR) 40 MG tablet Take 1 tablet (40 mg total) by mouth daily. 03/03/17   [place] Bing, DO  Blood Pressure Monitoring (ADULT BLOOD PRESSURE CUFF LG) KIT 1 kit by Does not apply route 2 (two) times daily. 11/24/13   Funches, Adriana Mccallum, MD  carvedilol (COREG CR) 20 MG 24 hr capsule Take 2 capsules (40 mg total) by mouth daily. 05/02/17   [place] Bing, DO  cyclobenzaprine (FLEXERIL) 5 MG tablet Take 1 tablet (5 mg total) by mouth 3 (three) times daily as needed for muscle spasms. 04/22/17   Verner Mould, MD  furosemide (LASIX) 40 MG tablet Take 1 tablet (40 mg total) by mouth daily. 04/17/17   [place] Bing, DO  lisinopril (PRINIVIL,ZESTRIL) 10 MG tablet Take 1 tablet (10 mg total) by mouth daily. 01/31/17  [place] Bing, DO    Family History Family History  Problem Relation Age of Onset  . Diabetes Mother   . Heart disease Mother 33  . Diabetes Sister   . Diabetes Brother   . Diabetes Brother   . Stroke Neg Hx   . Cancer Neg Hx     Social History Social History   Tobacco Use  . Smoking status: Never Smoker  . Smokeless tobacco: Never Used  Substance Use Topics  . Alcohol use: No    Alcohol/week: 0.0 oz  . Drug use: No     Allergies   Patient has no known allergies.   Review of Systems Review of Systems  Unable to perform ROS: Mental status change     Physical Exam Updated Vital Signs BP (!) 149/87   Pulse 90   Temp 98.2 F (36.8 C)  (Oral)   Resp 19   Ht 5' 8" (1.727 m)   Wt 134.3 kg (296 lb)   SpO2 98%   BMI 45.01 kg/m   Physical Exam  Nursing note and vitals reviewed.  Morbidly obese 52 year old male, resting comfortably and in no acute distress. Vital signs are significant for hypertension. Oxygen saturation is 98%, which is normal.  He is somnolent but able to answer questions Head is normocephalic and atraumatic. PERRLA, EOMI. Oropharynx is clear. Neck is nontender and supple without adenopathy or JVD. Back is nontender and there is no CVA tenderness. Lungs are clear without rales, wheezes, or rhonchi. Chest is nontender. Heart has regular rate and rhythm without murmur. Abdomen is soft, flat, nontender without masses or hepatosplenomegaly and peristalsis is normoactive. Extremities have trace edema, full range of motion is present. Skin is warm and dry without rash. Neurologic: Somnolent but arousable, slow to answer but able to answer questions, knows he is in a hospital and knows the month and year but not the day or date, cranial nerves are intact, there are no gross motor or sensory deficits.  ED Treatments / Results  Labs (all labs ordered are listed, but only abnormal results are displayed) Labs Reviewed  COMPREHENSIVE METABOLIC PANEL - Abnormal; Notable for the following components:      Result Value   Sodium 129 (*)    Chloride 93 (*)    CO2 21 (*)    Glucose, Bld 615 (*)    BUN 45 (*)    Creatinine, Ser 4.23 (*)    Calcium 8.5 (*)    Albumin 3.0 (*)    GFR calc non Af Amer 15 (*)    GFR calc Af Amer 17 (*)    All other components within normal limits  CBC WITH DIFFERENTIAL/PLATELET - Abnormal; Notable for the following components:   Hemoglobin 11.4 (*)    HCT 32.9 (*)    MCV 74.8 (*)    MCH 25.9 (*)    All other components within normal limits  CK - Abnormal; Notable for the following components:   Total CK 644 (*)    All other components within normal limits  URINALYSIS, ROUTINE W  REFLEX MICROSCOPIC - Abnormal; Notable for the following components:   Color, Urine STRAW (*)    Glucose, UA >=500 (*)    Hgb urine dipstick MODERATE (*)    Protein, ur 30 (*)    Bacteria, UA RARE (*)    Squamous Epithelial / LPF 0-5 (*)    All other components within normal limits  CBG MONITORING, ED - Abnormal; Notable for  the following components:   Glucose-Capillary 547 (*)    All other components within normal limits  I-STAT CG4 LACTIC ACID, ED - Abnormal; Notable for the following components:   Lactic Acid, Venous 2.73 (*)    All other components within normal limits  CBG MONITORING, ED - Abnormal; Notable for the following components:   Glucose-Capillary 526 (*)    All other components within normal limits  I-STAT VENOUS BLOOD GAS, ED  I-STAT CG4 LACTIC ACID, ED   Radiology Ct Head Wo Contrast  Result Date: 08/05/2017 CLINICAL DATA:  Seizure like activity. History of hypertension and diabetes. EXAM: CT HEAD WITHOUT CONTRAST TECHNIQUE: Contiguous axial images were obtained from the base of the skull through the vertex without intravenous contrast. COMPARISON:  CT HEAD Nov 26, 2013 FINDINGS: BRAIN: No intraparenchymal hemorrhage, mass effect nor midline shift. The ventricles and sulci are normal. No acute large vascular territory infarcts. No abnormal extra-axial fluid collections. Basal cisterns are patent. VASCULAR: Unremarkable. SKULL/SOFT TISSUES: No skull fracture. No significant soft tissue swelling. Prominent incisive foramen, normal variant ORBITS/SINUSES: The included ocular globes and orbital contents are normal.Mild paranasal sinus mucosal thickening. Mastoid air cells are well aerated. OTHER: Poor dentition. IMPRESSION: Negative noncontrast CT HEAD. Electronically Signed   By: Elon Alas M.D.   On: 08/05/2017 00:32   Dg Chest Port 1 View  Result Date: 08/05/2017 CLINICAL DATA:  Seizure like activity. EXAM: PORTABLE CHEST 1 VIEW COMPARISON:  None. FINDINGS: Cardiac  silhouette appears moderate to severely enlarged, mediastinal silhouette is non suspicious. Bronchitic changes without pleural effusion or focal consolidation. No pneumothorax. Large body habitus. Osseous structures are non suspicious. IMPRESSION: Moderate to severe cardiomegaly, potentially accentuated by AP technique. Recommend follow-up PA and lateral views of the chest when clinically able. Mild bronchitic changes without focal consolidation. Electronically Signed   By: Elon Alas M.D.   On: 08/05/2017 00:36    Procedures Procedures  CRITICAL CARE Performed by: Delora Fuel Total critical care time: 55 minutes Critical care time was exclusive of separately billable procedures and treating other patients. Critical care was necessary to treat or prevent imminent or life-threatening deterioration. Critical care was time spent personally by me on the following activities: development of treatment plan with patient and/or surrogate as well as nursing, discussions with consultants, evaluation of patient's response to treatment, examination of patient, obtaining history from patient or surrogate, ordering and performing treatments and interventions, ordering and review of laboratory studies, ordering and review of radiographic studies, pulse oximetry and re-evaluation of patient's condition.  Medications Ordered in ED Medications  dextrose 5 %-0.45 % sodium chloride infusion (not administered)  insulin regular bolus via infusion 0-10 Units (not administered)  insulin regular (NOVOLIN R,HUMULIN R) 100 Units in sodium chloride 0.9 % 100 mL (1 Units/mL) infusion (not administered)  dextrose 50 % solution 25 mL (not administered)  0.9 %  sodium chloride infusion (not administered)  sodium chloride 0.9 % bolus 1,000 mL (0 mLs Intravenous Stopped 08/05/17 0030)     Initial Impression / Assessment and Plan / ED Course  I have reviewed the triage vital signs and the nursing notes.  Pertinent labs  & imaging results that were available during my care of the patient were reviewed by me and considered in my medical decision making (see chart for details).  Possible seizure.  Patient's altered mentation could be related to postictal state.  Glucose is over 500.  He will be started on normal saline.  Old records are reviewed, and  glucose at recent office visits has been in the range of 100-200.  He is supposed to be on metformin XR 750 mg daily.  Will check screening labs and send for CT of head.  ED workup is significant for significant worsening of renal function.  Hyperglycemia is present without ketoacidosis.  He is started on glucose stabilizer.  Patient was reevaluated, and he is now awake and alert and oriented with normal speech.  CK and lactic acid are elevated consistent with seizure.  Elevated lactic acid level is not related to sepsis.  Case is discussed with Dr. Kris Mouton family practice service who agrees to admit the patient.  Final Clinical Impressions(s) / ED Diagnoses   Final diagnoses:  Hyperglycemia  Acute kidney injury (nontraumatic) ([place])  Seizure ([place])  Microcytic anemia    ED Discharge Orders    None       Delora Fuel, MD 16/10/96 [phone]   He had a seizure while waiting for family practice team to come to admit him.  He is given a dose of lorazepam and loading dose of levetiracetam.   Delora Fuel, MD 09/81/19 [phone]

## 2017-08-04 NOTE — ED Triage Notes (Addendum)
Per EMS, pt from home. Pt's wife called ems for sz like activity for about 20 minutes. Denies any injuries to self. Pt A&Ox4, pt needed to be asked a couple times before following commands on left side with EMS on arrival. 96% RA. Pt had 2 sz-like episodes with EMS. No hx of seizures. EMS gave 2.5 Versed. EMS VS CBG read HIGH. BP 142/91, 100% 15L non-rebreather, HR 132. Hx HTN.

## 2017-08-05 ENCOUNTER — Encounter (HOSPITAL_COMMUNITY): Payer: Self-pay | Admitting: General Practice

## 2017-08-05 ENCOUNTER — Inpatient Hospital Stay (HOSPITAL_COMMUNITY): Payer: 59

## 2017-08-05 ENCOUNTER — Other Ambulatory Visit: Payer: Self-pay

## 2017-08-05 ENCOUNTER — Emergency Department (HOSPITAL_COMMUNITY): Payer: 59

## 2017-08-05 DIAGNOSIS — I129 Hypertensive chronic kidney disease with stage 1 through stage 4 chronic kidney disease, or unspecified chronic kidney disease: Secondary | ICD-10-CM | POA: Diagnosis not present

## 2017-08-05 DIAGNOSIS — I10 Essential (primary) hypertension: Secondary | ICD-10-CM

## 2017-08-05 DIAGNOSIS — N184 Chronic kidney disease, stage 4 (severe): Secondary | ICD-10-CM | POA: Diagnosis not present

## 2017-08-05 DIAGNOSIS — R569 Unspecified convulsions: Secondary | ICD-10-CM

## 2017-08-05 DIAGNOSIS — E1169 Type 2 diabetes mellitus with other specified complication: Secondary | ICD-10-CM | POA: Diagnosis not present

## 2017-08-05 DIAGNOSIS — E860 Dehydration: Secondary | ICD-10-CM | POA: Diagnosis present

## 2017-08-05 DIAGNOSIS — E861 Hypovolemia: Secondary | ICD-10-CM | POA: Diagnosis present

## 2017-08-05 DIAGNOSIS — E870 Hyperosmolality and hypernatremia: Secondary | ICD-10-CM | POA: Diagnosis not present

## 2017-08-05 DIAGNOSIS — E782 Mixed hyperlipidemia: Secondary | ICD-10-CM | POA: Diagnosis present

## 2017-08-05 DIAGNOSIS — R600 Localized edema: Secondary | ICD-10-CM | POA: Diagnosis not present

## 2017-08-05 DIAGNOSIS — D509 Iron deficiency anemia, unspecified: Secondary | ICD-10-CM

## 2017-08-05 DIAGNOSIS — Z833 Family history of diabetes mellitus: Secondary | ICD-10-CM | POA: Diagnosis not present

## 2017-08-05 DIAGNOSIS — N179 Acute kidney failure, unspecified: Secondary | ICD-10-CM | POA: Diagnosis not present

## 2017-08-05 DIAGNOSIS — E1100 Type 2 diabetes mellitus with hyperosmolarity without nonketotic hyperglycemic-hyperosmolar coma (NKHHC): Secondary | ICD-10-CM | POA: Diagnosis not present

## 2017-08-05 DIAGNOSIS — I5040 Unspecified combined systolic (congestive) and diastolic (congestive) heart failure: Secondary | ICD-10-CM | POA: Diagnosis present

## 2017-08-05 DIAGNOSIS — E1101 Type 2 diabetes mellitus with hyperosmolarity with coma: Secondary | ICD-10-CM

## 2017-08-05 DIAGNOSIS — E669 Obesity, unspecified: Secondary | ICD-10-CM | POA: Diagnosis not present

## 2017-08-05 DIAGNOSIS — Z9114 Patient's other noncompliance with medication regimen: Secondary | ICD-10-CM | POA: Diagnosis not present

## 2017-08-05 DIAGNOSIS — R739 Hyperglycemia, unspecified: Secondary | ICD-10-CM | POA: Diagnosis present

## 2017-08-05 DIAGNOSIS — I130 Hypertensive heart and chronic kidney disease with heart failure and stage 1 through stage 4 chronic kidney disease, or unspecified chronic kidney disease: Secondary | ICD-10-CM | POA: Diagnosis present

## 2017-08-05 DIAGNOSIS — I160 Hypertensive urgency: Secondary | ICD-10-CM | POA: Diagnosis present

## 2017-08-05 DIAGNOSIS — R253 Fasciculation: Secondary | ICD-10-CM | POA: Diagnosis not present

## 2017-08-05 DIAGNOSIS — E1122 Type 2 diabetes mellitus with diabetic chronic kidney disease: Secondary | ICD-10-CM | POA: Diagnosis present

## 2017-08-05 DIAGNOSIS — Z6841 Body Mass Index (BMI) 40.0 and over, adult: Secondary | ICD-10-CM | POA: Diagnosis not present

## 2017-08-05 DIAGNOSIS — Z79899 Other long term (current) drug therapy: Secondary | ICD-10-CM | POA: Diagnosis not present

## 2017-08-05 HISTORY — DX: Acute kidney failure, unspecified: N17.9

## 2017-08-05 LAB — BASIC METABOLIC PANEL
Anion gap: 11 (ref 5–15)
Anion gap: 9 (ref 5–15)
Anion gap: 10 (ref 5–15)
Anion gap: 9 (ref 5–15)
BUN: 45 mg/dL — ABNORMAL HIGH (ref 6–20)
BUN: 45 mg/dL — AB (ref 6–20)
BUN: 42 mg/dL — ABNORMAL HIGH (ref 6–20)
BUN: 42 mg/dL — ABNORMAL HIGH (ref 6–20)
CHLORIDE: 100 mmol/L — AB (ref 101–111)
CHLORIDE: 105 mmol/L (ref 101–111)
CHLORIDE: 104 mmol/L (ref 101–111)
CHLORIDE: 104 mmol/L (ref 101–111)
CO2: 21 mmol/L — AB (ref 22–32)
CO2: 22 mmol/L (ref 22–32)
CO2: 21 mmol/L — AB (ref 22–32)
CO2: 22 mmol/L (ref 22–32)
Calcium: 8.6 mg/dL — ABNORMAL LOW (ref 8.9–10.3)
Calcium: 8.9 mg/dL (ref 8.9–10.3)
Calcium: 8.7 mg/dL — ABNORMAL LOW (ref 8.9–10.3)
Calcium: 8.8 mg/dL — ABNORMAL LOW (ref 8.9–10.3)
Creatinine, Ser: 3.79 mg/dL — ABNORMAL HIGH (ref 0.61–1.24)
Creatinine, Ser: 3.64 mg/dL — ABNORMAL HIGH (ref 0.61–1.24)
Creatinine, Ser: 3.32 mg/dL — ABNORMAL HIGH (ref 0.61–1.24)
Creatinine, Ser: 3.29 mg/dL — ABNORMAL HIGH (ref 0.61–1.24)
GFR calc Af Amer: 20 mL/min — ABNORMAL LOW (ref >60)
GFR calc Af Amer: 21 mL/min — ABNORMAL LOW (ref >60)
GFR calc non Af Amer: 17 mL/min — ABNORMAL LOW (ref >60)
GFR calc non Af Amer: 18 mL/min — ABNORMAL LOW (ref >60)
GFR calc non Af Amer: 20 mL/min — ABNORMAL LOW (ref >60)
GFR calc non Af Amer: 20 mL/min — ABNORMAL LOW (ref >60)
GFR, EST AFRICAN AMERICAN: 23 mL/min — AB (ref >60)
GFR, EST AFRICAN AMERICAN: 24 mL/min — AB (ref >60)
Glucose, Bld: 517 mg/dL (ref 65–99)
Glucose, Bld: 212 mg/dL — ABNORMAL HIGH (ref 65–99)
Glucose, Bld: 211 mg/dL — ABNORMAL HIGH (ref 65–99)
Glucose, Bld: 248 mg/dL — ABNORMAL HIGH (ref 65–99)
POTASSIUM: 3.7 mmol/L (ref 3.5–5.1)
POTASSIUM: 3.6 mmol/L (ref 3.5–5.1)
POTASSIUM: 3.5 mmol/L (ref 3.5–5.1)
POTASSIUM: 3.6 mmol/L (ref 3.5–5.1)
SODIUM: 132 mmol/L — AB (ref 135–145)
SODIUM: 136 mmol/L (ref 135–145)
SODIUM: 135 mmol/L (ref 135–145)
Sodium: 135 mmol/L (ref 135–145)

## 2017-08-05 LAB — ECHOCARDIOGRAM LIMITED
Ao-asc: 30 cm
CHL CUP MV DEC (S): 190
EWDT: 190 ms
FS: 32 % (ref 28–44)
Height: 68 in
IVS/LV PW RATIO, ED: 0.88
LA diam index: 1.5 cm/m2
LA vol index: 22.4 mL/m2
LA vol: 58.4 mL
LASIZE: 39 mm
LAVOLA4C: 69.7 mL
LDCA: 4.52 cm2
LEFT ATRIUM END SYS DIAM: 39 mm
LVOTD: 24 mm
MV Peak grad: 3 mmHg
MV pk E vel: 83.4 m/s
MVAP: 3.93 cm2
MVPKAVEL: 83.4 m/s
P 1/2 time: 56 ms
PW: 16 mm — AB (ref 0.6–1.1)
RV TAPSE: 22.6 mm
Weight: 4736 oz

## 2017-08-05 LAB — URINALYSIS, ROUTINE W REFLEX MICROSCOPIC
BILIRUBIN URINE: NEGATIVE
Glucose, UA: >=500 mg/dL — AB
KETONES UR: NEGATIVE mg/dL
LEUKOCYTES UA: NEGATIVE
NITRITE: NEGATIVE
PH: 5 (ref 5.0–8.0)
Protein, ur: 30 mg/dL — AB
Specific Gravity, Urine: 1.018 (ref 1.005–1.030)

## 2017-08-05 LAB — COMPREHENSIVE METABOLIC PANEL
ALK PHOS: 104 U/L (ref 38–126)
ALT: 20 U/L (ref 17–63)
AST: 27 U/L (ref 15–41)
Albumin: 3 g/dL — ABNORMAL LOW (ref 3.5–5.0)
Anion gap: 15 (ref 5–15)
BUN: 45 mg/dL — ABNORMAL HIGH (ref 6–20)
CALCIUM: 8.5 mg/dL — AB (ref 8.9–10.3)
CO2: 21 mmol/L — AB (ref 22–32)
CREATININE: 4.23 mg/dL — AB (ref 0.61–1.24)
Chloride: 93 mmol/L — ABNORMAL LOW (ref 101–111)
GFR calc non Af Amer: 15 mL/min — ABNORMAL LOW (ref >60)
GFR, EST AFRICAN AMERICAN: 17 mL/min — AB (ref >60)
Glucose, Bld: 615 mg/dL (ref 65–99)
Potassium: 3.9 mmol/L (ref 3.5–5.1)
Sodium: 129 mmol/L — ABNORMAL LOW (ref 135–145)
Total Bilirubin: 0.9 mg/dL (ref 0.3–1.2)
Total Protein: 6.6 g/dL (ref 6.5–8.1)

## 2017-08-05 LAB — CBC WITH DIFFERENTIAL/PLATELET
BASOS PCT: 0 %
Basophils Absolute: 0 10*3/uL (ref 0.0–0.1)
EOS ABS: 0.1 10*3/uL (ref 0.0–0.7)
Eosinophils Relative: 1 %
HCT: 32.9 % — ABNORMAL LOW (ref 39.0–52.0)
Hemoglobin: 11.4 g/dL — ABNORMAL LOW (ref 13.0–17.0)
Lymphocytes Relative: 10 %
Lymphs Abs: 0.9 10*3/uL (ref 0.7–4.0)
MCH: 25.9 pg — AB (ref 26.0–34.0)
MCHC: 34.7 g/dL (ref 30.0–36.0)
MCV: 74.8 fL — ABNORMAL LOW (ref 78.0–100.0)
MONO ABS: 0.9 10*3/uL (ref 0.1–1.0)
Monocytes Relative: 10 %
NEUTROS PCT: 79 %
Neutro Abs: 7.4 10*3/uL (ref 1.7–7.7)
PLATELETS: 228 10*3/uL (ref 150–400)
RBC: 4.4 MIL/uL (ref 4.22–5.81)
RDW: 12.3 % (ref 11.5–15.5)
WBC: 9.3 10*3/uL (ref 4.0–10.5)

## 2017-08-05 LAB — HIV ANTIBODY (ROUTINE TESTING W REFLEX): HIV SCREEN 4TH GENERATION: NONREACTIVE

## 2017-08-05 LAB — CBG MONITORING, ED
GLUCOSE-CAPILLARY: 482 mg/dL — AB (ref 65–99)
GLUCOSE-CAPILLARY: 390 mg/dL — AB (ref 65–99)
Glucose-Capillary: 526 mg/dL (ref 65–99)
Glucose-Capillary: 531 mg/dL (ref 65–99)
Glucose-Capillary: 526 mg/dL (ref 65–99)
Glucose-Capillary: 285 mg/dL — ABNORMAL HIGH (ref 65–99)
Glucose-Capillary: 200 mg/dL — ABNORMAL HIGH (ref 65–99)
Glucose-Capillary: 211 mg/dL — ABNORMAL HIGH (ref 65–99)
Glucose-Capillary: 177 mg/dL — ABNORMAL HIGH (ref 65–99)
Glucose-Capillary: 155 mg/dL — ABNORMAL HIGH (ref 65–99)

## 2017-08-05 LAB — RAPID URINE DRUG SCREEN, HOSP PERFORMED
Amphetamines: NOT DETECTED
Barbiturates: NOT DETECTED
Benzodiazepines: POSITIVE — AB
COCAINE: NOT DETECTED
OPIATES: NOT DETECTED
Tetrahydrocannabinol: NOT DETECTED

## 2017-08-05 LAB — PHOSPHORUS: Phosphorus: 3.7 mg/dL (ref 2.5–4.6)

## 2017-08-05 LAB — I-STAT CG4 LACTIC ACID, ED: Lactic Acid, Venous: 1.15 mmol/L (ref 0.5–1.9)

## 2017-08-05 LAB — GLUCOSE, CAPILLARY
GLUCOSE-CAPILLARY: 342 mg/dL — AB (ref 65–99)
Glucose-Capillary: 252 mg/dL — ABNORMAL HIGH (ref 65–99)

## 2017-08-05 LAB — CK: CK TOTAL: 644 U/L — AB (ref 49–397)

## 2017-08-05 LAB — TROPONIN I: Troponin I: 0.03 ng/mL (ref <0.03)

## 2017-08-05 LAB — MAGNESIUM: Magnesium: 1.8 mg/dL (ref 1.7–2.4)

## 2017-08-05 MED ORDER — SODIUM CHLORIDE 0.9 % IV SOLN: INTRAVENOUS | Status: DC

## 2017-08-05 MED ORDER — AMLODIPINE BESYLATE 10 MG PO TABS
10.0000 mg | ORAL_TABLET | Freq: Every day | ORAL | Status: DC
  Administered 2017-08-05 – 2017-08-08 (×4): 10 mg via ORAL
  Filled 2017-08-05 (×2): qty 1
  Filled 2017-08-05: qty 2
  Filled 2017-08-05: qty 1

## 2017-08-05 MED ORDER — INSULIN REGULAR BOLUS VIA INFUSION
0.0000 [IU] | Freq: Three times a day (TID) | INTRAVENOUS | Status: DC
  Filled 2017-08-05: qty 10

## 2017-08-05 MED ORDER — HYDRALAZINE HCL 20 MG/ML IJ SOLN: 10.0000 mg | Freq: Three times a day (TID) | INTRAMUSCULAR | Status: DC | PRN

## 2017-08-05 MED ORDER — HEPARIN SODIUM (PORCINE) 5000 UNIT/ML IJ SOLN
5000.0000 [IU] | Freq: Three times a day (TID) | INTRAMUSCULAR | Status: DC
  Administered 2017-08-05 – 2017-08-08 (×11): 5000 [IU] via SUBCUTANEOUS
  Filled 2017-08-05 (×10): qty 1

## 2017-08-05 MED ORDER — POTASSIUM CHLORIDE 10 MEQ/100ML IV SOLN
10.0000 meq | INTRAVENOUS | Status: AC
  Administered 2017-08-05 (×2): 10 meq via INTRAVENOUS
  Filled 2017-08-05 (×2): qty 100

## 2017-08-05 MED ORDER — SODIUM CHLORIDE 0.9 % IV SOLN
1000.0000 mg | Freq: Once | INTRAVENOUS | Status: AC
  Administered 2017-08-05: 1000 mg via INTRAVENOUS
  Filled 2017-08-05: qty 10

## 2017-08-05 MED ORDER — SODIUM CHLORIDE 0.9 % IV SOLN: INTRAVENOUS | Status: AC

## 2017-08-05 MED ORDER — ATORVASTATIN CALCIUM 40 MG PO TABS
40.0000 mg | ORAL_TABLET | Freq: Every day | ORAL | Status: DC
  Administered 2017-08-05 – 2017-08-07 (×4): 40 mg via ORAL
  Filled 2017-08-05 (×4): qty 1

## 2017-08-05 MED ORDER — DEXTROSE 50 % IV SOLN: 25.0000 mL | INTRAVENOUS | Status: DC | PRN

## 2017-08-05 MED ORDER — DEXTROSE-NACL 5-0.45 % IV SOLN
INTRAVENOUS | Status: DC
  Administered 2017-08-05: 1000 mL via INTRAVENOUS
  Administered 2017-08-05: 21:00:00 via INTRAVENOUS

## 2017-08-05 MED ORDER — INSULIN GLARGINE 100 UNIT/ML ~~LOC~~ SOLN
15.0000 [IU] | Freq: Every day | SUBCUTANEOUS | Status: DC
  Administered 2017-08-05: 15 [IU] via SUBCUTANEOUS
  Filled 2017-08-05 (×2): qty 0.15

## 2017-08-05 MED ORDER — INSULIN ASPART 100 UNIT/ML ~~LOC~~ SOLN
0.0000 [IU] | Freq: Three times a day (TID) | SUBCUTANEOUS | Status: DC
  Administered 2017-08-05 – 2017-08-06 (×3): 5 [IU] via SUBCUTANEOUS
  Administered 2017-08-06: 3 [IU] via SUBCUTANEOUS
  Administered 2017-08-07: 7 [IU] via SUBCUTANEOUS
  Administered 2017-08-07: 3 [IU] via SUBCUTANEOUS
  Administered 2017-08-07 – 2017-08-08 (×2): 5 [IU] via SUBCUTANEOUS
  Administered 2017-08-08: 3 [IU] via SUBCUTANEOUS

## 2017-08-05 MED ORDER — SODIUM CHLORIDE 0.9 % IV SOLN
INTRAVENOUS | Status: DC
  Administered 2017-08-05 – 2017-08-06 (×2): via INTRAVENOUS

## 2017-08-05 MED ORDER — CARVEDILOL PHOSPHATE ER 40 MG PO CP24
40.0000 mg | ORAL_CAPSULE | Freq: Every day | ORAL | Status: DC
Start: 2017-08-05 — End: 2017-08-08
  Administered 2017-08-05 – 2017-08-08 (×4): 40 mg via ORAL
  Filled 2017-08-05 (×4): qty 1

## 2017-08-05 MED ORDER — DEXTROSE-NACL 5-0.45 % IV SOLN: INTRAVENOUS | Status: DC

## 2017-08-05 MED ORDER — LORAZEPAM 2 MG/ML IJ SOLN
1.0000 mg | Freq: Once | INTRAMUSCULAR | Status: AC
  Administered 2017-08-05: 1 mg via INTRAVENOUS
  Filled 2017-08-05: qty 1

## 2017-08-05 MED ORDER — SODIUM CHLORIDE 0.9 % IV SOLN
INTRAVENOUS | Status: DC
  Administered 2017-08-05: 05:00:00 via INTRAVENOUS

## 2017-08-05 MED ORDER — HYDRALAZINE HCL 20 MG/ML IJ SOLN
10.0000 mg | Freq: Four times a day (QID) | INTRAMUSCULAR | Status: DC
  Administered 2017-08-05 – 2017-08-07 (×6): 10 mg via INTRAVENOUS
  Filled 2017-08-05 (×7): qty 1

## 2017-08-05 MED ORDER — INSULIN ASPART 100 UNIT/ML ~~LOC~~ SOLN
0.0000 [IU] | Freq: Every day | SUBCUTANEOUS | Status: DC
  Administered 2017-08-05: 4 [IU] via SUBCUTANEOUS
  Administered 2017-08-06 – 2017-08-07 (×2): 3 [IU] via SUBCUTANEOUS

## 2017-08-05 MED ORDER — SODIUM CHLORIDE 0.9 % IV SOLN
INTRAVENOUS | Status: DC
  Administered 2017-08-05: 4.7 [IU]/h via INTRAVENOUS
  Filled 2017-08-05: qty 1

## 2017-08-05 NOTE — Progress Notes (Signed)
Inpatient Diabetes Program Recommendations  AACE/ADA: New Consensus Statement on Inpatient Glycemic Control (2015)  Target Ranges:  Prepandial:   less than 140 mg/dL      Peak postprandial:   less than 180 mg/dL (1-2 hours)      Critically ill patients:  140 - 180 mg/dL   Lab Results  Component Value Date   GLUCAP 211 (H) 08/05/2017   HGBA1C 8.1 04/17/2017   Review of Glycemic Control  Diabetes history: DM 2 Outpatient Diabetes medications: Will verify Current orders for Inpatient glycemic control: IV insulin  Inpatient Diabetes Program Recommendations:    Patient will need to be well hydrated before transition. Renal function still elevated. At time of transition please consider Lantus 20 units (0.15 units/kg), Novolog Sensitive Correction 0-9 units tid (Due to renal function), Novolog HS scale 0-5 units QHS.  Will see patient today.  Thanks,  Tama Headings RN, MSN, [facility] Inpatient Diabetes Coordinator Team Pager [phone] (8a-5p)

## 2017-08-05 NOTE — Progress Notes (Signed)
1630 Pt arrived from ED, alert and oriented, sleeping but easy to arouse. Girlfriend and dgt at bedside. Pt assessed, see flow sheet. Lines checked and verified. Fall precautions in place, [facility].   21 Pt getting washed up by girlfriend, awaiting admission nurse to admit pt.

## 2017-08-05 NOTE — ED Notes (Signed)
Returned from EEG 

## 2017-08-05 NOTE — Progress Notes (Signed)
EEG completed; results pending.    

## 2017-08-05 NOTE — ED Notes (Signed)
Patient denies pain and is resting comfortably.  

## 2017-08-05 NOTE — ED Notes (Signed)
Paged family medicine to place SQ insulin orders and consider downgrading pt to telemetry.

## 2017-08-05 NOTE — Consult Note (Signed)
[facility] Consult Note     Date: 08/05/2017                  Patient Name:  Jeremy [name]  MRN: [id number]  DOB: 09/26/1965  Age / Sex: 52 y.o., male         PCP: [place] Bing, DO                 Service Requesting Consult: FMTS                 Reason for Consult: AKI on CKD            Chief Complaint: seizure like activity and HHS  HPI: Pt is a 61M with a PMH significant for HTN, HLD, DM, and CKD who is now seen in consultation at the request of Dr. McDiarmind for eval and recs of AKI on CKD.  Pt presented to ED with seizure like activity and was found to have CBG > 600.  CK about 600.  His admit creatinine was 4.2 and his cr now is 3.32 after fluids.  Baseline is 2.89.  We are asked to see.  Follows with Dr. Florene Glen  Is sleepy currently but has no complaints.    Past Medical History:  Diagnosis Date  . Abscess   . Bell's palsy   . Diabetes mellitus   . Hypertension   . Left shoulder pain 10/26/2013   S/p injection on 10/26/13   . Renal insufficiency 02/14/2014    Past Surgical History:  Procedure Laterality Date  . I&D EXTREMITY  11/11/2011   Procedure: IRRIGATION AND DEBRIDEMENT EXTREMITY;  Surgeon: Merrie Roof, MD;  Location: [facility] OR;  Service: General;  Laterality: Right;  I&D Right Thigh Abscess    Family History  Problem Relation Age of Onset  . Diabetes Mother   . Heart disease Mother 47  . Diabetes Sister   . Diabetes Brother   . Diabetes Brother   . Stroke Neg Hx   . Cancer Neg Hx    Social History:  reports that  has never smoked. he has never used smokeless tobacco. He reports that he does not drink alcohol or use drugs.  Allergies: No Known Allergies  Medications Prior to Admission  Medication Sig Dispense Refill  . amLODipine (NORVASC) 10 MG tablet Take 1 tablet (10 mg total) by mouth daily. 90 tablet 3  . atorvastatin (LIPITOR) 40 MG tablet Take 1 tablet (40 mg total) by mouth daily. 90 tablet 3  . carvedilol (COREG CR) 20  MG 24 hr capsule Take 2 capsules (40 mg total) by mouth daily. 90 capsule 3  . furosemide (LASIX) 40 MG tablet Take 1 tablet (40 mg total) by mouth daily. 30 tablet 3  . lisinopril (PRINIVIL,ZESTRIL) 10 MG tablet Take 1 tablet (10 mg total) by mouth daily. 90 tablet 3  . Blood Pressure Monitoring (ADULT BLOOD PRESSURE CUFF LG) KIT 1 kit by Does not apply route 2 (two) times daily. 1 each 0  . cyclobenzaprine (FLEXERIL) 5 MG tablet Take 1 tablet (5 mg total) by mouth 3 (three) times daily as needed for muscle spasms. (Patient not taking: Reported on 08/05/2017) 15 tablet 0    Results for orders placed or performed during the hospital encounter of 08/04/17 (from the past 48 hour(s))  CBG monitoring, ED     Status: Abnormal   Collection Time: 08/04/17 11:43 PM  Result Value Ref Range   Glucose-Capillary 547 (HH) 65 -  99 mg/dL  Comprehensive metabolic panel     Status: Abnormal   Collection Time: 08/04/17 11:51 PM  Result Value Ref Range   Sodium 129 (L) 135 - 145 mmol/L   Potassium 3.9 3.5 - 5.1 mmol/L   Chloride 93 (L) 101 - 111 mmol/L   CO2 21 (L) 22 - 32 mmol/L   Glucose, Bld 615 (HH) 65 - 99 mg/dL    Comment: CRITICAL RESULT CALLED TO, READ BACK BY AND VERIFIED WITH: W.MUNNETT,RN 0111 08/05/17 G.MCADOO    BUN 45 (H) 6 - 20 mg/dL   Creatinine, Ser 4.23 (H) 0.61 - 1.24 mg/dL   Calcium 8.5 (L) 8.9 - 10.3 mg/dL   Total Protein 6.6 6.5 - 8.1 g/dL   Albumin 3.0 (L) 3.5 - 5.0 g/dL   AST 27 15 - 41 U/L   ALT 20 17 - 63 U/L   Alkaline Phosphatase 104 38 - 126 U/L   Total Bilirubin 0.9 0.3 - 1.2 mg/dL   GFR calc non Af Amer 15 (L) >60 mL/min   GFR calc Af Amer 17 (L) >60 mL/min    Comment: (NOTE) The eGFR has been calculated using the CKD EPI equation. This calculation has not been validated in all clinical situations. eGFR's persistently <60 mL/min signify possible Chronic Kidney Disease.    Anion gap 15 5 - 15  CBC with Differential     Status: Abnormal   Collection Time: 08/04/17  11:51 PM  Result Value Ref Range   WBC 9.3 4.0 - 10.5 K/uL   RBC 4.40 4.22 - 5.81 MIL/uL   Hemoglobin 11.4 (L) 13.0 - 17.0 g/dL   HCT 32.9 (L) 39.0 - 52.0 %   MCV 74.8 (L) 78.0 - 100.0 fL   MCH 25.9 (L) 26.0 - 34.0 pg   MCHC 34.7 30.0 - 36.0 g/dL   RDW 12.3 11.5 - 15.5 %   Platelets 228 150 - 400 K/uL   Neutrophils Relative % 79 %   Lymphocytes Relative 10 %   Monocytes Relative 10 %   Eosinophils Relative 1 %   Basophils Relative 0 %   Neutro Abs 7.4 1.7 - 7.7 K/uL   Lymphs Abs 0.9 0.7 - 4.0 K/uL   Monocytes Absolute 0.9 0.1 - 1.0 K/uL   Eosinophils Absolute 0.1 0.0 - 0.7 K/uL   Basophils Absolute 0.0 0.0 - 0.1 K/uL   Smear Review MORPHOLOGY UNREMARKABLE   CK     Status: Abnormal   Collection Time: 08/04/17 11:51 PM  Result Value Ref Range   Total CK 644 (H) 49 - 397 U/L  I-Stat CG4 Lactic Acid, ED     Status: Abnormal   Collection Time: 08/05/17 12:02 AM  Result Value Ref Range   Lactic Acid, Venous 2.73 (HH) 0.5 - 1.9 mmol/L   Comment NOTIFIED PHYSICIAN   I-Stat venous blood gas, ED     Status: None   Collection Time: 08/05/17 12:02 AM  Result Value Ref Range   pH, Ven 7.355 7.250 - 7.430   pCO2, Ven 47.5 44.0 - 60.0 mmHg   pO2, Ven 40.0 32.0 - 45.0 mmHg   Bicarbonate 26.6 20.0 - 28.0 mmol/L   TCO2 28 22 - 32 mmol/L   O2 Saturation 72.0 %   Patient temperature HIDE    Sample type VENOUS   Urinalysis, Routine w reflex microscopic     Status: Abnormal   Collection Time: 08/05/17 12:47 AM  Result Value Ref Range   Color, Urine STRAW (A) YELLOW  APPearance CLEAR CLEAR   Specific Gravity, Urine 1.018 1.005 - 1.030   pH 5.0 5.0 - 8.0   Glucose, UA >=500 (A) NEGATIVE mg/dL   Hgb urine dipstick MODERATE (A) NEGATIVE   Bilirubin Urine NEGATIVE NEGATIVE   Ketones, ur NEGATIVE NEGATIVE mg/dL   Protein, ur 30 (A) NEGATIVE mg/dL   Nitrite NEGATIVE NEGATIVE   Leukocytes, UA NEGATIVE NEGATIVE   RBC / HPF 0-5 0 - 5 RBC/hpf   WBC, UA 0-5 0 - 5 WBC/hpf   Bacteria, UA RARE  (A) NONE SEEN   Squamous Epithelial / LPF 0-5 (A) NONE SEEN  CBG monitoring, ED     Status: Abnormal   Collection Time: 08/05/17  1:29 AM  Result Value Ref Range   Glucose-Capillary 526 (HH) 65 - 99 mg/dL  I-Stat CG4 Lactic Acid, ED     Status: None   Collection Time: 08/05/17  2:19 AM  Result Value Ref Range   Lactic Acid, Venous 1.15 0.5 - 1.9 mmol/L  CBG monitoring, ED     Status: Abnormal   Collection Time: 08/05/17  3:11 AM  Result Value Ref Range   Glucose-Capillary 531 (HH) 65 - 99 mg/dL  CBG monitoring, ED     Status: Abnormal   Collection Time: 08/05/17  4:16 AM  Result Value Ref Range   Glucose-Capillary 526 (HH) 65 - 99 mg/dL  HIV antibody (Routine Testing)     Status: None   Collection Time: 08/05/17  5:18 AM  Result Value Ref Range   HIV Screen 4th Generation wRfx Non Reactive Non Reactive    Comment: (NOTE) Performed At: [facility] [place], [place] [id number] Rush Farmer MD VO:[id number]   Magnesium     Status: None   Collection Time: 08/05/17  5:18 AM  Result Value Ref Range   Magnesium 1.8 1.7 - 2.4 mg/dL  Phosphorus     Status: None   Collection Time: 08/05/17  5:18 AM  Result Value Ref Range   Phosphorus 3.7 2.5 - 4.6 mg/dL  Basic metabolic panel     Status: Abnormal   Collection Time: 08/05/17  5:18 AM  Result Value Ref Range   Sodium 132 (L) 135 - 145 mmol/L   Potassium 3.7 3.5 - 5.1 mmol/L   Chloride 100 (L) 101 - 111 mmol/L   CO2 21 (L) 22 - 32 mmol/L   Glucose, Bld 517 (HH) 65 - 99 mg/dL    Comment: CRITICAL RESULT CALLED TO, READ BACK BY AND VERIFIED WITH: C.SHERVANKA,RN 4259 08/05/17 G.MCADOO    BUN 45 (H) 6 - 20 mg/dL   Creatinine, Ser 3.79 (H) 0.61 - 1.24 mg/dL   Calcium 8.6 (L) 8.9 - 10.3 mg/dL   GFR calc non Af Amer 17 (L) >60 mL/min   GFR calc Af Amer 20 (L) >60 mL/min    Comment: (NOTE) The eGFR has been calculated using the CKD EPI equation. This calculation has not been validated in all clinical  situations. eGFR's persistently <60 mL/min signify possible Chronic Kidney Disease.    Anion gap 11 5 - 15  Troponin I     Status: Abnormal   Collection Time: 08/05/17  5:18 AM  Result Value Ref Range   Troponin I 0.03 (HH) <0.03 ng/mL    Comment: CRITICAL RESULT CALLED TO, READ BACK BY AND VERIFIED WITH: C.SHERVANKA,RN [id number] 08/05/17 G.MCADOO   CBG monitoring, ED     Status: Abnormal   Collection Time: 08/05/17  5:23 AM  Result Value  Ref Range   Glucose-Capillary 482 (H) 65 - 99 mg/dL   Comment 1 Notify RN    Comment 2 Document in Chart   Urine rapid drug screen (hosp performed)     Status: Abnormal   Collection Time: 08/05/17  5:29 AM  Result Value Ref Range   Opiates NONE DETECTED NONE DETECTED   Cocaine NONE DETECTED NONE DETECTED   Benzodiazepines POSITIVE (A) NONE DETECTED   Amphetamines NONE DETECTED NONE DETECTED   Tetrahydrocannabinol NONE DETECTED NONE DETECTED   Barbiturates NONE DETECTED NONE DETECTED    Comment: (NOTE) DRUG SCREEN FOR MEDICAL PURPOSES ONLY.  IF CONFIRMATION IS NEEDED FOR ANY PURPOSE, NOTIFY LAB WITHIN 5 DAYS. LOWEST DETECTABLE LIMITS FOR URINE DRUG SCREEN Drug Class                     Cutoff (ng/mL) Amphetamine and metabolites    1000 Barbiturate and metabolites    200 Benzodiazepine                 035 Tricyclics and metabolites     300 Opiates and metabolites        300 Cocaine and metabolites        300 THC                            50   CBG monitoring, ED     Status: Abnormal   Collection Time: 08/05/17  6:39 AM  Result Value Ref Range   Glucose-Capillary 390 (H) 65 - 99 mg/dL   Comment 1 Notify RN    Comment 2 Document in Chart   CBG monitoring, ED     Status: Abnormal   Collection Time: 08/05/17  7:43 AM  Result Value Ref Range   Glucose-Capillary 285 (H) 65 - 99 mg/dL  CBG monitoring, ED     Status: Abnormal   Collection Time: 08/05/17  9:10 AM  Result Value Ref Range   Glucose-Capillary 200 (H) 65 - 99 mg/dL  Basic  metabolic panel     Status: Abnormal   Collection Time: 08/05/17  9:15 AM  Result Value Ref Range   Sodium 136 135 - 145 mmol/L   Potassium 3.6 3.5 - 5.1 mmol/L   Chloride 105 101 - 111 mmol/L   CO2 22 22 - 32 mmol/L   Glucose, Bld 212 (H) 65 - 99 mg/dL   BUN 45 (H) 6 - 20 mg/dL   Creatinine, Ser 3.64 (H) 0.61 - 1.24 mg/dL   Calcium 8.9 8.9 - 10.3 mg/dL   GFR calc non Af Amer 18 (L) >60 mL/min   GFR calc Af Amer 21 (L) >60 mL/min    Comment: (NOTE) The eGFR has been calculated using the CKD EPI equation. This calculation has not been validated in all clinical situations. eGFR's persistently <60 mL/min signify possible Chronic Kidney Disease.    Anion gap 9 5 - 15  CBG monitoring, ED     Status: Abnormal   Collection Time: 08/05/17 10:14 AM  Result Value Ref Range   Glucose-Capillary 211 (H) 65 - 99 mg/dL  CBG monitoring, ED     Status: Abnormal   Collection Time: 08/05/17 11:22 AM  Result Value Ref Range   Glucose-Capillary 177 (H) 65 - 99 mg/dL  CBG monitoring, ED     Status: Abnormal   Collection Time: 08/05/17 12:30 PM  Result Value Ref Range   Glucose-Capillary 155 (H) 65 -  99 mg/dL   Comment 1 Notify RN    Comment 2 Document in Chart   Basic metabolic panel     Status: Abnormal   Collection Time: 08/05/17  2:05 PM  Result Value Ref Range   Sodium 135 135 - 145 mmol/L   Potassium 3.5 3.5 - 5.1 mmol/L   Chloride 104 101 - 111 mmol/L   CO2 21 (L) 22 - 32 mmol/L   Glucose, Bld 211 (H) 65 - 99 mg/dL   BUN 42 (H) 6 - 20 mg/dL   Creatinine, Ser 3.32 (H) 0.61 - 1.24 mg/dL   Calcium 8.7 (L) 8.9 - 10.3 mg/dL   GFR calc non Af Amer 20 (L) >60 mL/min   GFR calc Af Amer 23 (L) >60 mL/min    Comment: (NOTE) The eGFR has been calculated using the CKD EPI equation. This calculation has not been validated in all clinical situations. eGFR's persistently <60 mL/min signify possible Chronic Kidney Disease.    Anion gap 10 5 - 15   Ct Head Wo Contrast  Result Date:  08/05/2017 CLINICAL DATA:  Seizure like activity. History of hypertension and diabetes. EXAM: CT HEAD WITHOUT CONTRAST TECHNIQUE: Contiguous axial images were obtained from the base of the skull through the vertex without intravenous contrast. COMPARISON:  CT HEAD Nov 26, 2013 FINDINGS: BRAIN: No intraparenchymal hemorrhage, mass effect nor midline shift. The ventricles and sulci are normal. No acute large vascular territory infarcts. No abnormal extra-axial fluid collections. Basal cisterns are patent. VASCULAR: Unremarkable. SKULL/SOFT TISSUES: No skull fracture. No significant soft tissue swelling. Prominent incisive foramen, normal variant ORBITS/SINUSES: The included ocular globes and orbital contents are normal.Mild paranasal sinus mucosal thickening. Mastoid air cells are well aerated. OTHER: Poor dentition. IMPRESSION: Negative noncontrast CT HEAD. Electronically Signed   By: Elon Alas M.D.   On: 08/05/2017 00:32   Dg Chest Port 1 View  Result Date: 08/05/2017 CLINICAL DATA:  Seizure like activity. EXAM: PORTABLE CHEST 1 VIEW COMPARISON:  None. FINDINGS: Cardiac silhouette appears moderate to severely enlarged, mediastinal silhouette is non suspicious. Bronchitic changes without pleural effusion or focal consolidation. No pneumothorax. Large body habitus. Osseous structures are non suspicious. IMPRESSION: Moderate to severe cardiomegaly, potentially accentuated by AP technique. Recommend follow-up PA and lateral views of the chest when clinically able. Mild bronchitic changes without focal consolidation. Electronically Signed   By: Elon Alas M.D.   On: 08/05/2017 00:36    ROS: all systems reviewed and are negative except as per HPI  Blood pressure (!) 151/87, pulse 65, temperature 98.2 F (36.8 C), temperature source Oral, resp. rate 11, height _0  (1.727 m), weight 134.3 kg (296 lb), SpO2 92 %. Physical Exam  GEN lying in bed, sleepy but arousable HEENT sclerae anicteric NECK  no JVD PULM clear CV RRR ABD obese, nontender EXT trace LE edema NEURO sleepy but answers questions appropriately, appears postical SKIN no rashes or lesions  Assessment/Plan  1.  AKI on CKD: likely due to hypovolemia from osmotic diuresis from HHS.  Improving with fluids.  Avoid nephrotoxins and hypotension.  Not much to add at this point.  Will reschedule appt with Dr. Florene Glen if still in the hospital on 1/18.  2.  HHS: resolved, per primary.  3.  HTN: cardiomegaly on CXR, TTE done and is pending   Madelon Lips, MD [place] pgr [phone] 08/05/2017, 4:48 PM

## 2017-08-05 NOTE — Progress Notes (Signed)
FPTS Interim Progress Note  Patient in emergency room for seizure-like activity following hyperglycemic state concerning for HHS.  Patient was asleep during visit with sister and girlfriend in the room.  Discussed with them regarding seizure-like activity.  Does sound concerning for seizure-like activity and was given Keppra and Ativan in the ED.  [name] is well-known to me and has a history of diabetes formally on insulin but weaned off due to better control of A1c.  We tried metformin for control patient is having GI upset along with worsening CKD due to uncontrolled hypertension.  It was difficult getting patient to see nephrology for evaluation.  Patient's girlfriend states patient takes his antihypertensives regularly but cannot remember the pill for his diabetes.  It appears that [name] will likely need insulin to better control his diabetes after discharge.  He also needs better control of his blood pressure as he is approaching the state where he will require HD.  We will continue to follow seizure  workup.  Appreciate the great care FPTS is providing for [name].  [place] Bing, DO 08/05/2017, 1:36 PM PGY-2, [facility] pager: [phone]

## 2017-08-05 NOTE — ED Notes (Signed)
CBG 211 

## 2017-08-05 NOTE — Progress Notes (Signed)
Inpatient Diabetes Program Recommendations  AACE/ADA: New Consensus Statement on Inpatient Glycemic Control (2015)  Target Ranges:  Prepandial:   less than 140 mg/dL      Peak postprandial:   less than 180 mg/dL (1-2 hours)      Critically ill patients:  140 - 180 mg/dL   Lab Results  Component Value Date   GLUCAP 155 (H) 08/05/2017   HGBA1C 8.1 04/17/2017   Was going to speak with patient about current DM management at home. Per RN patient is lethargic and going for an EEG.  Have seen in chart patient was on insulin in the past in 2015 but was taken off with good glycemia control with A1c 6.4%. Recently in September 2018 A1c started to go up at 8.1%. Patient on Metformin outpatient seeing Dr. Yisroel Ramming and will follow up with him after d/c.   Will see patient tomorrow and review diet and management.  Thanks,  Tama Headings RN, MSN, [facility] Inpatient Diabetes Coordinator Team Pager [phone] (8a-5p)

## 2017-08-05 NOTE — Progress Notes (Signed)
  Echocardiogram 2D Echocardiogram has been performed.  Kortland Nichols G Semiah Konczal 08/05/2017, 9:07 AM

## 2017-08-05 NOTE — Evaluation (Signed)
Physical Therapy Evaluation Patient Details Name: Jeremy [name] MRN: [id number] DOB: [birth year] Today's Date: 08/05/2017   History of Present Illness  Pt is a 52 y/o male admitted secondary to AMS, seizure activity and elevated blood glucose. Pt's CBG found to be over 500. CT of the head negative for acute abnormality. PMH includes HTN, DM, and bells palsy.   Clinical Impression  Pt admitted secondary to problem above with deficits below. PTA, pt was independent with functional mobility. Upon eval, pt very sleepy which limited mobility tolerance. Only able to tolerate side stepping at EOB this session secondary to lethargy and required min to min guard assist. Pt required cues to keep eyes open as well throughout session. Pt's girlfriend present throughout session. Anticipate pt will progress well once lethargy improved, however, will continue to follow acutely to ensure safety and independence with functional mobility.     Follow Up Recommendations No PT follow up;Supervision/Assistance - 24 hour    Equipment Recommendations  Other (comment)(TBD pending pt progress )    Recommendations for Other Services       Precautions / Restrictions Precautions Precautions: Fall;Other (comment) Precaution Comments: seizure precautions  Restrictions Weight Bearing Restrictions: No      Mobility  Bed Mobility Overal bed mobility: Needs Assistance Bed Mobility: Supine to Sit;Sit to Supine     Supine to sit: Min assist Sit to supine: Min assist   General bed mobility comments: Min A for trunk elevation. Verbal cues to keep eyes open when sitting EOB. Increased alertness at EOB, however, still very sleepy and required min guard for safety while at EOB. Min A for LE lift assist upon return to supine.   Transfers Overall transfer level: Needs assistance Equipment used: None Transfers: Sit to/from Stand Sit to Stand: Min guard         General transfer comment: Min guard for steadying  assist.   Ambulation/Gait Ambulation/Gait assistance: Min guard;Min assist Ambulation Distance (Feet): 1 Feet Assistive device: None Gait Pattern/deviations: Step-to pattern Gait velocity: Decreased Gait velocity interpretation: Below normal speed for age/gender General Gait Details: Side stepped along EOB for repositioning. Pt unsteady and required min to min guard assist for steadying. Pt very sleepy, so anticipate unsteadiness related to level of arousal. Verbal cues to keep eyes open throughout.   Stairs            Wheelchair Mobility    Modified Rankin (Stroke Patients Only)       Balance Overall balance assessment: Needs assistance Sitting-balance support: Feet supported;Bilateral upper extremity supported Sitting balance-Leahy Scale: Poor Sitting balance - Comments: Reliant on Min guard for safety secondary to sleepiness   Standing balance support: No upper extremity supported;During functional activity Standing balance-Leahy Scale: Poor Standing balance comment: Reliant on external support for balance                             Pertinent Vitals/Pain Pain Assessment: Faces Faces Pain Scale: No hurt    Home Living Family/patient expects to be discharged to:: Private residence Living Arrangements: Spouse/significant other Available Help at Discharge: Family;Available 24 hours/day Type of Home: House Home Access: Stairs to enter Entrance Stairs-Rails: Psychiatric nurse of Steps: 1 Home Layout: One level Home Equipment: None      Prior Function Level of Independence: Independent               Hand Dominance        Extremity/Trunk Assessment  Upper Extremity Assessment Upper Extremity Assessment: Defer to OT evaluation    Lower Extremity Assessment Lower Extremity Assessment: Overall WFL for tasks assessed    Cervical / Trunk Assessment Cervical / Trunk Assessment: Normal  Communication   Communication: No  difficulties  Cognition Arousal/Alertness: Lethargic Behavior During Therapy: Flat affect Overall Cognitive Status: Difficult to assess                                 General Comments: Pt very sleepy throughout session. Difficult to stay awake during basic mobility. Was alert and oriented X4.       General Comments General comments (skin integrity, edema, etc.): Pt's girlfriend present during session.     Exercises     Assessment/Plan    PT Assessment Patient needs continued PT services  PT Problem List Decreased balance;Decreased activity tolerance;Decreased mobility;Decreased knowledge of use of DME;Decreased knowledge of precautions;Decreased safety awareness;Decreased cognition       PT Treatment Interventions Gait training;Stair training;Functional mobility training;Therapeutic activities;Balance training;Therapeutic exercise;Neuromuscular re-education;Patient/family education    PT Goals (Current goals can be found in the Care Plan section)  Acute Rehab PT Goals Patient Stated Goal: none stated  PT Goal Formulation: Patient unable to participate in goal setting Time For Goal Achievement: 08/19/17 Potential to Achieve Goals: Good    Frequency Min 3X/week   Barriers to discharge        Co-evaluation               AM-PAC PT "6 Clicks" Daily Activity  Outcome Measure Difficulty turning over in bed (including adjusting bedclothes, sheets and blankets)?: A Little Difficulty moving from lying on back to sitting on the side of the bed? : Unable Difficulty sitting down on and standing up from a chair with arms (e.g., wheelchair, bedside commode, etc,.)?: Unable Help needed moving to and from a bed to chair (including a wheelchair)?: A Little Help needed walking in hospital room?: A Little Help needed climbing 3-5 steps with a railing? : Total 6 Click Score: 12    End of Session Equipment Utilized During Treatment: Gait belt Activity Tolerance:  Patient limited by lethargy Patient left: in bed;with call bell/phone within reach;with family/visitor present Nurse Communication: Mobility status PT Visit Diagnosis: Unsteadiness on feet (R26.81);Difficulty in walking, not elsewhere classified (R26.2)    Time: 1205-1221 PT Time Calculation (min) (ACUTE ONLY): 16 min   Charges:   PT Evaluation $PT Eval Moderate Complexity: 1 Mod     PT G Codes:        Leighton Ruff, PT, DPT  Acute Rehabilitation Services  Pager: [phone]   Rudean Hitt 08/05/2017, 12:36 PM

## 2017-08-05 NOTE — ED Notes (Signed)
Patient transported to EEG

## 2017-08-05 NOTE — ED Notes (Signed)
X-ray at bedside

## 2017-08-05 NOTE — H&P (Signed)
[facility] Admission History and Physical Service Pager: [phone]  Patient name: Jeremy [name] Medical record number: [id number] Date of birth: [birth year] Age: 52 y.o. Gender: male  Primary Care Provider: [place] Bing, DO Consultants: none Code Status: full  Chief Complaint: seizure  Assessment and Plan: Jeremy [name] is a 52 y.o. male presenting with seizures and hyperglycemia.  Seizure Patient with seizure-like activity presumed to be related to his hyperglycemia. CK elevated 644 consistent with seizure activity Has never had a seizure prior to tonight to the best of his knowledge.  No history of infection and normal white blood cell count likely chest.  CT head negative for signs of a mass or bleeding.  No history of head trauma. VBG within normal limits. Given loading dose of keppra and Ativan in the ed. Will correct hyperglycemia. Will get EEG in am for baseline. If abnormal will consult neurology. Believe this to be metabolic in origin. - admit to inpatient, dr. Mingo Amber, stepdown - vital signs per stepdown routine - seizure precautions - EEG in am - PT/OT consult  - Correct hyperglycemia (as below) - replete electrolytes as needed - prn ativan for seizure  Hyperglycemia  Hyperosmolar Non-ketotic  Unclear why patient became so hyperglycemic. Has not had any illness or potential exacerbating event. There is confusion regarding which medication he is taking for diabetes. Appears to be on metformin XR although his girlfriend disputes this. Patient has had very poor dietary control recently - [employer] and [employer]. Has been eating a lot of sugary foods. Had some herald symptoms of increased thirst and urination over last couple of days. Other potential confounding factor is that perhaps patient had a non-metabolic seizure which has caused his increased blood sugar. Believe this to be unlikely as he has never had seizure activity prior to this  presentation. No ketones in urine.  Patient denies any symptoms of a cardiac event, however will check a troponin and EKG to rule out this as contributing to hyperglycemia - NS bolus at 999 mL/hr for 2 hours - NS at 150 mL/hr until cbg <250 - transition to d5 1/2ns at 128m/hr when CBG<250 - Insulin GTT, transition to subq when CBG < 250 - replete kcl as needed with insulin infusion - Follow up A1C - BMET q4hrs - Obtain mag and phosphorus level - diabetic education  - dextrose 50% as needed for low cbg -Obtain troponin and EKG  AKI on CKD IV Patient's baseline cr appears to be ~2.8-3.0 Cr 4.23 at admission. Likely pre-renal due to dehydration. Patient also with bp 210/130 while in ed. HTN could also be contributing. Apparently dialysis has been discussed in the past with patient's nephrologist but he has been doing well enough to avoid this for now. If patient's creatinine is still significantly elevated after fluid hydration will consider nephrology consult. Will hold furosemide and lisinopril due to aki. - fluids as above - holding lisinopril and furosemide - Daily BMET - consider switching over to renal penal once IV insulin stopped  - consider nephrology consult  HLD Cholesterol 171 in 2017. Continue atorvastatin 481mdaily  HTN BP significantly elevated in ed up to 210/130. Will continue home amlodipine, coreg. Holding home lisinopril and furosemide due to aki. - Added PRN hydralazine for sbp > 180.  Leg Swelling  Patient with 2+ pitting edema in BLE. Cardiomegaly noted on cxr. Has never had EKG. Will plan to get an ekg and echocardiogram to evaluate for possible cardiomyopathy while inpatient. Possible  cardiorenal syndrome not out of the realm of possibility given the above.  PMH is significant for T2DM, CKD IV, Bell's Palsy of R eye, HLD, Obesity, HTN.  FEN/GI: carb-modified Prophylaxis: heparin 5000U  Disposition: home  History of Present Illness:  Jeremy [name] is a 52  y.o. male presenting with seizures. Patient was in his normal state of health until the night of 1/14. His girlfriend noticed that he was having "seizure-activity". She describes this state as him "rolling his head" and his left check twitching. This occurred several times. After these episodes stopped he was found to be very sleepy and confused. At this time she called EMS. She states that he has really had no problems aside from being increasingly thirsty and having to urinate a lot over the last few days. There is some confusion over which medication he is taking for his diabetic control. He is prescribed metformin XR per clinic notes from august.  Workup in the ED included cbc, cmp, cbg, ck, lactic acid, urinalysis, Ct head, chest xray. CMP significant for na 129, cl 93, glucose 615, cr 4.23, ca 8.5, alb 3.0. Cbc significant for wbc 9.3, hgb 11.4, 32.9, plt 228. Glucose found to be 500+ on multiple checks. CK 644, lactic acid 2.73 to 1.15 on recheck. VBG: 7.355/47/40/26. UA with 500 glucose, rare bacteria. Ct head negative for stroke. CXR showing moderate to severe cardiomegaly.  Patient was found to have continued seizure activity in the ED. He was given a loading dose of keppra.   Review Of Systems: Per HPI with the following additions:  ROS  Patient Active Problem List   Diagnosis Date Noted  . Hyperosmolar non-ketotic state in patient with type 2 diabetes mellitus ([place]) 08/05/2017  . Back pain 04/22/2017  . Bilateral lower extremity edema 04/08/2017  . Diabetes mellitus type 2, uncontrolled ([place]) 03/03/2017  . Chronic kidney disease (CKD), stage IV (severe) ([place]) 02/14/2014  . Rupture of right biceps tendon 12/07/2013  . History of Bell's palsy with residual right eye ptosis 06/08/2013  . Morbid obesity ([place]) 05/20/2013  . Erectile dysfunction associated with type 2 diabetes mellitus ([place]) 06/02/2012  . Macular edema 12/12/2011  . Primary hypertension 11/15/2011  . Mixed hyperlipidemia  due to type 2 diabetes mellitus ([place]) 11/11/2011    Past Medical History: Past Medical History:  Diagnosis Date  . Abscess   . Bell's palsy   . Diabetes mellitus   . Hypertension   . Left shoulder pain 10/26/2013   S/p injection on 10/26/13   . Renal insufficiency 02/14/2014    Past Surgical History: Past Surgical History:  Procedure Laterality Date  . I&D EXTREMITY  11/11/2011   Procedure: IRRIGATION AND DEBRIDEMENT EXTREMITY;  Surgeon: Merrie Roof, MD;  Location: [facility];  Service: General;  Laterality: Right;  I&D Right Thigh Abscess    Social History: Social History   Tobacco Use  . Smoking status: Never Smoker  . Smokeless tobacco: Never Used  Substance Use Topics  . Alcohol use: No    Alcohol/week: 0.0 oz  . Drug use: No   Additional social history:  Please also refer to relevant sections of EMR.  Family History: Family History  Problem Relation Age of Onset  . Diabetes Mother   . Heart disease Mother 61  . Diabetes Sister   . Diabetes Brother   . Diabetes Brother   . Stroke Neg Hx   . Cancer Neg Hx     Allergies and Medications: No Known Allergies  No current facility-administered medications on file prior to encounter.    Current Outpatient Medications on File Prior to Encounter  Medication Sig Dispense Refill  . amLODipine (NORVASC) 10 MG tablet Take 1 tablet (10 mg total) by mouth daily. 90 tablet 3  . atorvastatin (LIPITOR) 40 MG tablet Take 1 tablet (40 mg total) by mouth daily. 90 tablet 3  . carvedilol (COREG CR) 20 MG 24 hr capsule Take 2 capsules (40 mg total) by mouth daily. 90 capsule 3  . furosemide (LASIX) 40 MG tablet Take 1 tablet (40 mg total) by mouth daily. 30 tablet 3  . lisinopril (PRINIVIL,ZESTRIL) 10 MG tablet Take 1 tablet (10 mg total) by mouth daily. 90 tablet 3  . Blood Pressure Monitoring (ADULT BLOOD PRESSURE CUFF LG) KIT 1 kit by Does not apply route 2 (two) times daily. 1 each 0  . cyclobenzaprine (FLEXERIL) 5 MG tablet Take  1 tablet (5 mg total) by mouth 3 (three) times daily as needed for muscle spasms. (Patient not taking: Reported on 08/05/2017) 15 tablet 0    Objective: BP 127/77   Pulse 83   Temp 98.2 F (36.8 C) (Oral)   Resp 13   Ht _0  (1.727 m)   Wt 296 lb (134.3 kg)   SpO2 91%   BMI 45.01 kg/m  Exam: General: obese, african [place] male, sleepy but arousable. AOx3 Eyes: eomi, perrl, no conjunctival injection ENTM: ears and nose without any obvious signs of external trauma Neck: full range of motion, no cervical adenopathy Cardiovascular: rrr, no m/r/g, palpable radial pulse bilaterally. Palpable dp/tp bilaterally. 2+pitting edema BLE Respiratory: lungs clear to auscultation, exam limited by body habitus Gastrointestinal: soft, non-tender, non-distended. Bowel sounds present. Umbilical hernia present. Somewhat tender to palpation MSK: poor effort, could not evaluate. Derm: warm and dry Neuro: aox3, cn 2-12 intact. NO focal neuro deficit Psych: sleepy but appropriate  Labs and Imaging: CBC BMET  Recent Labs  Lab 08/04/17 2351  WBC 9.3  HGB 11.4*  HCT 32.9*  PLT 228   Recent Labs  Lab 08/04/17 2351  NA 129*  K 3.9  CL 93*  CO2 21*  BUN 45*  CREATININE 4.23*  GLUCOSE 615*  CALCIUM 8.5Guadalupe Dawn, MD 08/05/2017, 4:29 AM PGY-1, [place] [facility] pager: [phone], text pages welcome  UPPER LEVEL ADDENDUM  I have read the above note and made revisions highlighted in blue.  Kerrin Mo, MD, PGY-3 [facility]

## 2017-08-05 NOTE — ED Notes (Signed)
CBG 285. 

## 2017-08-05 NOTE — ED Notes (Signed)
Patient transported to CT 

## 2017-08-06 DIAGNOSIS — E669 Obesity, unspecified: Secondary | ICD-10-CM

## 2017-08-06 DIAGNOSIS — E1169 Type 2 diabetes mellitus with other specified complication: Secondary | ICD-10-CM | POA: Diagnosis present

## 2017-08-06 DIAGNOSIS — R253 Fasciculation: Secondary | ICD-10-CM

## 2017-08-06 DIAGNOSIS — E870 Hyperosmolality and hypernatremia: Secondary | ICD-10-CM | POA: Diagnosis present

## 2017-08-06 LAB — HEMOGLOBIN A1C
Hgb A1c MFr Bld: >15.5 % — ABNORMAL HIGH (ref 4.8–5.6)
Mean Plasma Glucose: >398 mg/dL

## 2017-08-06 LAB — GLUCOSE, CAPILLARY
GLUCOSE-CAPILLARY: 292 mg/dL — AB (ref 65–99)
GLUCOSE-CAPILLARY: 238 mg/dL — AB (ref 65–99)
GLUCOSE-CAPILLARY: 258 mg/dL — AB (ref 65–99)
Glucose-Capillary: 275 mg/dL — ABNORMAL HIGH (ref 65–99)

## 2017-08-06 MED ORDER — POLYETHYLENE GLYCOL 3350 17 G PO PACK
17.0000 g | PACK | Freq: Every day | ORAL | Status: DC
  Administered 2017-08-07 – 2017-08-08 (×2): 17 g via ORAL
  Filled 2017-08-06 (×2): qty 1

## 2017-08-06 MED ORDER — LIVING WELL WITH DIABETES BOOK
Freq: Once | Status: AC
  Administered 2017-08-06: 15:00:00
  Filled 2017-08-06: qty 1

## 2017-08-06 MED ORDER — INSULIN GLARGINE 100 UNIT/ML ~~LOC~~ SOLN
18.0000 [IU] | Freq: Every day | SUBCUTANEOUS | Status: DC
  Administered 2017-08-06: 18 [IU] via SUBCUTANEOUS
  Filled 2017-08-06 (×4): qty 0.18

## 2017-08-06 MED ORDER — ONDANSETRON HCL 4 MG/2ML IJ SOLN
4.0000 mg | Freq: Four times a day (QID) | INTRAMUSCULAR | Status: DC | PRN
  Administered 2017-08-06: 4 mg via INTRAMUSCULAR
  Filled 2017-08-06: qty 2

## 2017-08-06 MED ORDER — ACETAMINOPHEN 325 MG PO TABS
325.0000 mg | ORAL_TABLET | Freq: Four times a day (QID) | ORAL | Status: DC | PRN
  Administered 2017-08-06 – 2017-08-08 (×4): 325 mg via ORAL
  Filled 2017-08-06 (×5): qty 1

## 2017-08-06 NOTE — Evaluation (Signed)
Occupational Therapy Evaluation Patient Details Name: Jeremy [name] MRN: [id number] DOB: 1966/01/05 Today's Date: 08/06/2017    History of Present Illness Pt is a 52 y/o male admitted secondary to AMS, seizure activity and elevated blood glucose. Pt's CBG found to be over 500. CT of the head negative for acute abnormality. PMH includes HTN, DM, and bells palsy.    Clinical Impression   Patient presenting with decreased I in self care, balance, functional transfers and mobility, safety awareness. Patient reports being independent PTA. He worked a full time job in [employer] and was driving.  Patient currently functioning min guard - S overall without use of assistive devices. Patient will benefit from acute OT to increase overall independence in the areas of ADLs, functional mobility, and safety awarebess in order to safely discharge home with caregiver.    Follow Up Recommendations  No OT follow up;Supervision/Assistance - 24 hour    Equipment Recommendations  None recommended by OT    Recommendations for Other Services Other (comment)(none)     Precautions / Restrictions Precautions Precautions: Fall;Other (comment) Precaution Comments: seizure precautions  Restrictions Weight Bearing Restrictions: No      Mobility Bed Mobility Overal bed mobility: Needs Assistance Bed Mobility: Supine to Sit     Supine to sit: Supervision     General bed mobility comments: seated in recliner chair upon entering the room  Transfers Overall transfer level: Needs assistance Equipment used: None Transfers: Sit to/from Stand Sit to Stand: Min guard         General transfer comment: Min guard for steadying assist.     Balance Overall balance assessment: Needs assistance Sitting-balance support: Feet supported;Bilateral upper extremity supported Sitting balance-Leahy Scale: Good Sitting balance - Comments: Reliant on Min guard for safety secondary to sleepiness   Standing balance  support: No upper extremity supported;During functional activity Standing balance-Leahy Scale: Fair Standing balance comment: Min guard ambulating for safety         ADL either performed or assessed with clinical judgement   ADL Overall ADL's : Needs assistance/impaired       General ADL Comments: overall supervision with all aspects of self care secondary to safety and balance     Vision Baseline Vision/History: Wears glasses Wears Glasses: Reading only Patient Visual Report: No change from baseline              Pertinent Vitals/Pain Pain Assessment: No/denies pain Faces Pain Scale: No hurt     Hand Dominance Right   Extremity/Trunk Assessment Upper Extremity Assessment Upper Extremity Assessment: Generalized weakness   Lower Extremity Assessment Lower Extremity Assessment: Overall WFL for tasks assessed   Cervical / Trunk Assessment Cervical / Trunk Assessment: Normal   Communication Communication Communication: No difficulties   Cognition Arousal/Alertness: Lethargic Behavior During Therapy: Flat affect Overall Cognitive Status: Impaired/Different from baseline Area of Impairment: Following commands;Orientation       Orientation Level: Situation     Following Commands: Follows one step commands inconsistently       General Comments: Pt with many "blank" moments and looks of confusion with conversation   General Comments  pt's girlfriends present during session            [place] expects to be discharged to:: Private residence Living Arrangements: Spouse/significant other Available Help at Discharge: Family;Available 24 hours/day Type of Home: House Home Access: Stairs to enter [employer] of Steps: 1 Entrance Stairs-Rails: Right;Left Home Layout: One level     Bathroom Shower/Tub: Tub/shower unit  Bathroom Toilet: Standard     Home Equipment: None   Additional Comments: Pt does sponge bathing       Prior Functioning/Environment Level of Independence: Independent              OT Problem List: Decreased strength;Impaired balance (sitting and/or standing);Decreased safety awareness;Decreased activity tolerance;Decreased knowledge of use of DME or AE      OT Treatment/Interventions: Self-care/ADL training;DME and/or AE instruction;Therapeutic activities;Balance training;Energy conservation;Patient/family education;Therapeutic exercise    OT Goals(Current goals can be found in the care plan section) Acute Rehab OT Goals Patient Stated Goal: to go home OT Goal Formulation: With patient/family Time For Goal Achievement: 08/20/17 Potential to Achieve Goals: Good ADL Goals Pt Will Perform Lower Body Bathing: with modified independence Pt Will Perform Lower Body Dressing: with modified independence Pt Will Transfer to Toilet: with modified independence Pt Will Perform Toileting - Clothing Manipulation and hygiene: with modified independence  OT Frequency: Min 2X/week   Barriers to D/C:    none known at this time          AM-PAC PT "6 Clicks" Daily Activity     Outcome Measure Help from another person eating meals?: None Help from another person taking care of personal grooming?: A Little Help from another person toileting, which includes using toliet, bedpan, or urinal?: A Little Help from another person bathing (including washing, rinsing, drying)?: A Little Help from another person to put on and taking off regular upper body clothing?: A Little Help from another person to put on and taking off regular lower body clothing?: A Little 6 Click Score: 19   End of Session    Activity Tolerance: Patient tolerated treatment well;Patient limited by lethargy Patient left: in chair;with call bell/phone within reach;with family/visitor present  OT Visit Diagnosis: Unsteadiness on feet (R26.81);Muscle weakness (generalized) (M62.81)                Time: 5366-4403 OT Time  Calculation (min): 24 min Charges:  OT General Charges $OT Visit: 1 Visit OT Evaluation $OT Eval Low Complexity: 1 Low OT Treatments $Self Care/Home Management : 8-22 mins   Kemari Mares P, MS, OTR/L, CBIS 08/06/2017, 11:39 AM

## 2017-08-06 NOTE — Procedures (Signed)
ELECTROENCEPHALOGRAM REPORT  Date of Study: 08/05/2017 (assigned to this provider on 08/06/2017)  Patient's Name: Jeremy [name] MRN: [id number] Date of Birth: September 18, 1965  Referring Provider: Dr. Esmond Camper  Clinical History: This is a 52 year old man with recurrent twitching  Medications: NORVASC 10 MG tablet  LIPITOR 40 MG tablet    COREG CR 20 MG 24 hr capsule  FLEXERIL 5 MG tablet LASIX 40 MG tablet  PRINIVIL,ZESTRIL 10 MG tablet  Technical Summary: A multichannel digital EEG recording measured by the international 10-20 system with electrodes applied with paste and impedances below 5000 ohms performed in our laboratory with EKG monitoring in an awake and drowsy patient.  Hyperventilation was not performed. Photic stimulation was performed.  The digital EEG was referentially recorded, reformatted, and digitally filtered in a variety of bipolar and referential montages for optimal display.    Description: The patient is awake and drowsy during the recording.  During maximal wakefulness, there is a symmetric, medium voltage 9 Hz posterior dominant rhythm that attenuates with eye opening.  The record is symmetric.  During drowsiness and sleep, there is an increase in theta and delta slowing of the background, with shifting asymmetry over the bilateral temporal regions. Deeper stages of sleep were not seen. Photic stimulation did not elicit any abnormalities.  There were no epileptiform discharges or electrographic seizures seen.    EKG lead was unremarkable.  Impression: This awake and drowsy EEG is within normal limits.  Clinical Correlation: A normal EEG does not exclude a clinical diagnosis of epilepsy. Clinical correlation is advised.   Ellouise Newer, M.D.

## 2017-08-06 NOTE — Progress Notes (Signed)
Physical Therapy Treatment Patient Details Name: Jeremy [name] MRN: [id number] DOB: 1966-07-21 Today's Date: 08/06/2017    History of Present Illness Pt is a 52 y/o male admitted secondary to AMS, seizure activity and elevated blood glucose. Pt's CBG found to be over 500. CT of the head negative for acute abnormality. PMH includes HTN, DM, and bells palsy.     PT Comments    Patient progressing with therapy. Session focused on increasing activity and ambulation into hallway. Pt presenting more alert than prior session, although had one episode of confusion during session becoming disoriented to situation for 30 seconds, no symptoms remainder of session. Demonstrating increased independence with bed mobility, transfers, and gait without AD- however unable to progress to stair training and long distance walking due to concern of cognitive status and fall risk this visit. Anticipate good progression next therapy session.   Follow Up Recommendations  No PT follow up;Supervision/Assistance - 24 hour     Equipment Recommendations  Other (comment)(TBD pending progress)    Recommendations for Other Services       Precautions / Restrictions Precautions Precautions: Fall;Other (comment) Precaution Comments: seizure precautions  Restrictions Weight Bearing Restrictions: No    Mobility  Bed Mobility Overal bed mobility: Needs Assistance Bed Mobility: Supine to Sit     Supine to sit: Supervision     General bed mobility comments: supervision for supine to sit. able to perform without physical assistance.  Transfers Overall transfer level: Needs assistance Equipment used: None Transfers: Sit to/from Stand Sit to Stand: Min guard         General transfer comment: Min guard for steadying assist.   Ambulation/Gait Ambulation/Gait assistance: Min guard Ambulation Distance (Feet): 100 Feet Assistive device: None Gait Pattern/deviations: Step-to pattern;Step-through pattern Gait  velocity: normal   General Gait Details: Patient ambulating with intermittent step through pattern, mild path deviation BL and general unsteadiness without AD. normal velocity.    Stairs            Wheelchair Mobility    Modified Rankin (Stroke Patients Only)       Balance Overall balance assessment: Needs assistance Sitting-balance support: Feet supported;Bilateral upper extremity supported Sitting balance-Leahy Scale: Good     Standing balance support: No upper extremity supported;During functional activity Standing balance-Leahy Scale: Fair Standing balance comment: Min guard ambulating for safety                            Cognition Arousal/Alertness: Lethargic Behavior During Therapy: Flat affect Overall Cognitive Status: Impaired/Different from baseline Area of Impairment: Following commands;Orientation                 Orientation Level: Situation     Following Commands: Follows one step commands inconsistently       General Comments: Pt more awake this session, however during ambulation stopped and looked confused, when asked what was wrong he was unable to state a reason and did not remeber pausing.       Exercises      General Comments General comments (skin integrity, edema, etc.): Pt's girlfriend present during session      Pertinent Vitals/Pain Pain Assessment: No/denies pain    Home Living                      Prior Function            PT Goals (current goals can now be found in the care  plan section) Acute Rehab PT Goals Patient Stated Goal: to get out of hospital PT Goal Formulation: With patient Time For Goal Achievement: 08/19/17 Potential to Achieve Goals: Good Progress towards PT goals: Progressing toward goals    Frequency    Min 3X/week      PT Plan Current plan remains appropriate    Co-evaluation              AM-PAC PT "6 Clicks" Daily Activity  Outcome Measure  Difficulty  turning over in bed (including adjusting bedclothes, sheets and blankets)?: A Little Difficulty moving from lying on back to sitting on the side of the bed? : A Little Difficulty sitting down on and standing up from a chair with arms (e.g., wheelchair, bedside commode, etc,.)?: A Little Help needed moving to and from a bed to chair (including a wheelchair)?: A Little Help needed walking in hospital room?: A Little Help needed climbing 3-5 steps with a railing? : A Lot 6 Click Score: 17    End of Session Equipment Utilized During Treatment: Gait belt Activity Tolerance: Patient limited by lethargy Patient left: in chair;with call bell/phone within reach;with family/visitor present Nurse Communication: Mobility status PT Visit Diagnosis: Unsteadiness on feet (R26.81);Difficulty in walking, not elsewhere classified (R26.2)     Time: 5868-2574 PT Time Calculation (min) (ACUTE ONLY): 31 min  Charges:  $Gait Training: 8-22 mins                    G Codes:       Reinaldo Berber, PT, DPT Acute Rehab Services Pager: [phone]     Reinaldo Berber 08/06/2017, 9:43 AM

## 2017-08-06 NOTE — Progress Notes (Signed)
FPTS Interim Progress Note  Patient experiencing repetitive speech intermittently throughout the day.  I spoke with girlfriend and patient's daughter regarding the situation.  Patient recognized me as I walked into the room.  Patient did have repeated words initially but was able to articulate his thoughts.  He is alert and oriented to person, time, place, and situation.  CNII-XII intact, PERRLA, EOMI, fine motor intact, clear and coherent speech following the initial repetitive speech.  He was able to remember 2 of 3 objects and was capable of spelling world backwards.  I discussed at length with girlfriend and daughter regarding need for glucose and blood pressure control at this time.  Suspect speech may be residual from initial enticing event from hyperglycemia.  EEG evaluation did not show any findings concerning for seizure activity.  We will continue to monitor for any changes in mentation or neuro status.  [place] Bing, DO 08/06/2017, 6:51 PM PGY-2, [facility] Service pager: [phone]

## 2017-08-06 NOTE — Evaluation (Signed)
Clinical/Bedside Swallow Evaluation Patient Details  Name: Jeremy [name] MRN: [id number] Date of Birth: 1965/10/15  Today's Date: 08/06/2017 Time: SLP Start Time (ACUTE ONLY): 1015 SLP Stop Time (ACUTE ONLY): 1030 SLP Time Calculation (min) (ACUTE ONLY): 15 min  Past Medical History:  Past Medical History:  Diagnosis Date  . Abscess   . AKI (acute kidney injury) ([place]) 08/05/2017  . Bell's palsy   . CKD (chronic kidney disease)    Dr. McDiarmind   . High cholesterol   . Hypertension   . Left shoulder pain 10/26/2013   S/p injection on 10/26/13   . Renal insufficiency 02/14/2014  . Seizures ([place]) 08/04/2017   "related to high blood sugars" (08/05/2017)  . Type II diabetes mellitus ([place])    Past Surgical History:  Past Surgical History:  Procedure Laterality Date  . I&D EXTREMITY  11/11/2011   Procedure: IRRIGATION AND DEBRIDEMENT EXTREMITY;  Surgeon: Merrie Roof, MD;  Location: [facility];  Service: General;  Laterality: Right;  I&D Right Thigh Abscess   HPI:  52 year old male presenting with hyperglycemia plus seizure-like activity. Patient has history of known type 2 diabetes, CKD stage IV, hyperlipidemia. Patient was in his usual state of health and went to work on Sunday which was the day of admission. He came home with reports of not feeling well and had some the date and went to bed. His girlfriend went to church. When she returned home she noted that he began having seizure-like activity including twitching and slumping forward onto the bed and shaking. She called EMS. In the ED his glucose was found to be 615. CK was slightly elevated at 644. Ongoing seizure activity was given a loading dose of Keppra. Negative noncontrast CT HEAD with chest xray revealing moderate to severe cardiomegaly, potentially accentuated by AP technique. Mild bronchitic changes without focal consolidation.    Assessment / Plan / Recommendation Clinical Impression  Pt appears at reduced risk of  aspiration when following general aspriation precautions. Pt consumed graham crackers and 6 oz thin liquids via straw without overt s/s of aspiration. Education provided to pt and his girlfriend about consuming PO intake when upright and alert. Both voiced understanding. Pt appeared mildly confused with girlfriend reporting that he is "much better."  If nursing continues to witness any cognitive deficits, ST services can be reconsulted for Cognitive Evaluation SLP Visit Diagnosis: Dysphagia, unspecified (R13.10)    Aspiration Risk  No limitations    Diet Recommendation Regular;Thin liquid   Liquid Administration via: Straw;Cup Medication Administration: Whole meds with liquid Supervision: Patient able to self feed Compensations: Minimize environmental distractions;Slow rate;Small sips/bites Postural Changes: Seated upright at 90 degrees    Other  Recommendations Oral Care Recommendations: Oral care BID   Follow up Recommendations None             Prognosis        Swallow Study   General Date of Onset: 08/04/17 HPI: 52 year old male presenting with hyperglycemia plus seizure-like activity. Patient has history of known type 2 diabetes, CKD stage IV, hyperlipidemia. Patient was in his usual state of health and went to work on Sunday which was the day of admission. He came home with reports of not feeling well and had some the date and went to bed. His girlfriend went to church. When she returned home she noted that he began having seizure-like activity including twitching and slumping forward onto the bed and shaking. She called EMS. In the ED his glucose was found to  be 615. CK was slightly elevated at 644. Ongoing seizure activity was given a loading dose of Keppra. Negative noncontrast CT HEAD with chest xray revealing moderate to severe cardiomegaly, potentially accentuated by AP technique. Mild bronchitic changes without focal consolidation.  Type of Study: Bedside Swallow  Evaluation Previous Swallow Assessment: none in chart Diet Prior to this Study: Regular;Thin liquids Temperature Spikes Noted: No Respiratory Status: Room air History of Recent Intubation: No Behavior/Cognition: Alert;Cooperative;Pleasant mood Oral Cavity Assessment: Within Functional Limits Oral Care Completed by SLP: No Oral Cavity - Dentition: Adequate natural dentition Vision: Functional for self-feeding Self-Feeding Abilities: Able to feed self Patient Positioning: Upright in chair Baseline Vocal Quality: Normal Volitional Cough: Strong Volitional Swallow: Able to elicit    Oral/Motor/Sensory Function Overall Oral Motor/Sensory Function: Within functional limits   Ice Chips Ice chips: Within functional limits Presentation: Self Fed   Thin Liquid Thin Liquid: Within functional limits Presentation: Straw;Self Fed;Cup    Nectar Thick Nectar Thick Liquid: Not tested   Honey Thick Honey Thick Liquid: Not tested   Puree Puree: Not tested   Solid   GO   Solid: Within functional limits Presentation: Self Fed        Felicity Penix 08/06/2017,11:08 AM

## 2017-08-06 NOTE — Progress Notes (Signed)
Family Medicine Teaching Service Daily Progress Note Intern Pager: [phone]  Patient name: Jeremy [name] Medical record number: [id number] Date of birth: [birth year] Age: 52 y.o. Gender: male  Primary Care Provider: [place] Bing, DO Consultants: none Code Status: full  Pt Overview and Major Events to Date:  1/15 admitted to [facility], transitioned off insulin gtt  Assessment and Plan: Seizure No seizure-like activity since admission. Did receive loading dose of keppra and ativan in ed. Likely due to his hyperglycemia. He is much more awake and alert at this point. EEG has been done but no read back yet. At this point can likely chalk up seizures to metabolic reasons, unless EEG comes back very abnormal can consider this problem resolved. - vital signs per stepdown routine - seizure precautions - Follow up EEG results - PT/OT - continue to correct hyperglycemia (as below) - replete electrolytes as needed - prn ativan for seizure  Hyperglycemia  Hyperosmolar Non-ketotic  Hyperglycemia likely due to combination of medication non-compliance and poor lifestyle modifications. Has been transitioned to lantus and sliding scale. Lantus 15 U and 9U aspart overnight. Last three sugars 252, 342, and 275. Hemoglobin A1C still pending. Patient appears to have gotten d5 containing fluids overnight which likely contributed to his elevation in sugars. Will increase lantus to 18U overnight and adjust as necessary. Will keep on sensitive sliding scale insulin. - continue to trend cbg - lantus 18U, sSSI - follow up a1c - stopping fluids - daily bmp - diabetic education  AKI on CKD IV Patient's baseline cr appears to be ~2.8-3.0 Cr 4.23 at admission. Likely pre-renal due to dehydration. Patient also with bp 210/130 while in ed. HTN could also be contributing. Patient's creatinine has returned to close to his baselineof 3.3. GFR is 20. Nephrology following appreciate their recs. Will continue to hold  home lisinopril until has stable cr and gfr as outpatient per phone discussion with nephrology. - saline lock iv - continue holding lisinopril and furosemide - holding lisinopril and furosemide - daily bmp  HLD Cholesterol 171 in 2017. Continue atorvastatin 40mg  daily  HTN BP significantly elevated in ed up to 210/130. Has improved with most recent bp into the 147W systolic. Will continue home amlodipine, coreg. Holding home lisinopril and furosemide due to aki. Per discussion with nephrology, recommended this continue to be held until gfr and cr stabilize as outpatient. - continue home amlodipine, coreg - scheduled hydralazine 10mg  prn - can consider increasing dose if sbp doesn't decrease with fluid stop - goal is 130/90 to prevent further renal damage  Mixed systolic and diastolic CHF Patient with 2+ pitting edema in BLE. Cardiomegaly noted on cxr. Echo with 45-50% EF. Qualifies for mixed chf. Unable to assess PA pressure. Already on ACE, Beta blocker, CCb, and Loop diuretic. Holding ACE and loop due to kidney function.  FEN/GI: carb-modified PPx: heparin 5000U  Disposition: home  Subjective:  No acute events overnight. Complaining that he has not received his breakfast yet.  Objective: Temp:  [98.1 F (36.7 C)-99 F (37.2 C)] 98.5 F (36.9 C) (01/16 0502) Pulse Rate:  [62-83] 83 (01/16 0502) Resp:  [11-20] 18 (01/16 0502) BP: (123-173)/(73-101) 149/77 (01/16 0502) SpO2:  [89 %-98 %] 98 % (01/16 0502) Weight:  [310 lb 6.5 oz (140.8 kg)-316 lb 9.3 oz (143.6 kg)] 316 lb 9.3 oz (143.6 kg) (01/16 0152) Physical Exam: General: obese, african [place] male, much more alert. AOx3 Eyes: eomi, perrl, no conjunctival injection Cardiovascular: rrr, no m/r/g, 2+pitting edema BLE Respiratory:  lungs clear to auscultation, exam limited by body habitus Gastrointestinal: soft, non-tender, non-distended. Bowel sounds present. Umbilical hernia present. Somewhat tender to palpation in  LLQ. MSK: 5/5 strength BUE, BLE Derm: warm and dry Neuro: aox3, cn 2-12 intact. NO focal neuro deficit Psych: sleepy but appropriate  Laboratory: Recent Labs  Lab 08/04/17 2351  WBC 9.3  HGB 11.4*  HCT 32.9*  PLT 228   Recent Labs  Lab 08/04/17 2351  08/05/17 0915 08/05/17 1405 08/05/17 1706  NA 129*   < > 136 135 135  K 3.9   < > 3.6 3.5 3.6  CL 93*   < > 105 104 104  CO2 21*   < > 22 21* 22  BUN 45*   < > 45* 42* 42*  CREATININE 4.23*   < > 3.64* 3.32* 3.29*  CALCIUM 8.5*   < > 8.9 8.7* 8.8*  PROT 6.6  --   --   --   --   BILITOT 0.9  --   --   --   --   ALKPHOS 104  --   --   --   --   ALT 20  --   --   --   --   AST 27  --   --   --   --   GLUCOSE 615*   < > 212* 211* 248*   < > = values in this interval not displayed.   Imaging/Diagnostic Tests: CLINICAL DATA:  Seizure like activity. History of hypertension and diabetes.  EXAM: CT HEAD WITHOUT CONTRAST  TECHNIQUE: Contiguous axial images were obtained from the base of the skull through the vertex without intravenous contrast.  COMPARISON:  CT HEAD Nov 26, 2013  FINDINGS: BRAIN: No intraparenchymal hemorrhage, mass effect nor midline shift. The ventricles and sulci are normal. No acute large vascular territory infarcts. No abnormal extra-axial fluid collections. Basal cisterns are patent.  VASCULAR: Unremarkable.  SKULL/SOFT TISSUES: No skull fracture. No significant soft tissue swelling. Prominent incisive foramen, normal variant  ORBITS/SINUSES: The included ocular globes and orbital contents are normal.Mild paranasal sinus mucosal thickening. Mastoid air cells are well aerated.  OTHER: Poor dentition.  IMPRESSION: Negative noncontrast CT HEAD.  Guadalupe Dawn, MD 08/06/2017, 9:34 AM PGY-1, [facility] Intern pager: [phone], text pages welcome

## 2017-08-06 NOTE — Progress Notes (Signed)
Patient's wife reported an episode of repetitive speech.  Assessed patient to be alert and oriented to time, place, situation.  No repetitive speech noted at time of assessment.  Patient denies pain or discomfort, moves extremities purposefully and to command. Have notified provider on call, will continue to monitor.

## 2017-08-06 NOTE — Progress Notes (Addendum)
Inpatient Diabetes Program Recommendations  AACE/ADA: New Consensus Statement on Inpatient Glycemic Control (2015)  Target Ranges:  Prepandial:   less than 140 mg/dL      Peak postprandial:   less than 180 mg/dL (1-2 hours)      Critically ill patients:  140 - 180 mg/dL   Results for Jeremy [name], Jeremy [name] (MRN [id number]) as of 08/06/2017 12:55  Ref. Range 08/05/2017 17:11 08/05/2017 22:07 08/06/2017 07:53 08/06/2017 12:13  Glucose-Capillary Latest Ref Range: 65 - 99 mg/dL 252 (H) 342 (H) 275 (H) 292 (H)   Spoke with patient about diabetes and home regimen for diabetes control.  Patient reports that he has gained 25-30 pounds that he had lost when he had controlled his DM and had a low A1c. Patient also reports drinking regular soft drinks all the time. Discussed A1C results (>15.5%). Discussed glucose and A1C goals. Discussed importance of checking CBGs and maintaining good CBG control to prevent long-term and short-term complications. Explained how hyperglycemia leads to damage within blood vessels which lead to the common complications seen with uncontrolled diabetes. Stressed to the patient the importance of improving glycemic control to prevent further complications from uncontrolled diabetes. Discussed impact of nutrition, exercise, stress, sickness, and medications on diabetes control. Discussed carbohydrates, carbohydrate goals per day and meal, along with portion sizes. Patient verbalized understanding of information discussed and has no further questions at this time related to diabetes.  Due to patient weight and considering renal function, please consider increasing Lantus to 24 units (a little more than 0.15 units/kg).  Thanks,  Tama Headings RN, MSN, [facility] Inpatient Diabetes Coordinator Team Pager [phone] (8a-5p)

## 2017-08-06 NOTE — Progress Notes (Addendum)
Jeremy [name] Progress Note    Assessment/ Plan:   1.  AKI on CKD: likely due to hypovolemia from osmotic diuresis from HHS.  Improving with fluids.  Avoid nephrotoxins and hypotension.  Not much to add at this point.  Will reschedule appt with Dr. Florene Glen if still in the hospital on 1/18.  2.  HHS: resolved, per primary.  Insulin regimen is getting adjusted.  CDE recommending at least 20 u insulin  3.  HTN: on norvasc 10 mg daily, coreg CR 40 mg daily.  BP still elevated.  Would use hydralazine given new EF of 45-50%; would hold ACEi for now until acute issues resolved.   4.  New systolic CHF: EF 37-90%, diffuse hypokinesis.  Per primary.  Could refer to cardiology as OP for ischemic w/u (no CP, trop 0.03)  5.  Seizures: per neurology.  No AEDs at present.    Subjective:    Improving Cr.  BP still a little elevated   Objective:   BP (!) 176/88 (BP Location: Right Arm)   Pulse 83   Temp 98.5 F (36.9 C) (Oral)   Resp 18   Ht 5\' 8"  (1.727 m)   Wt (!) 143.6 kg (316 lb 9.3 oz)   SpO2 98%   BMI 48.14 kg/m   Intake/Output Summary (Last 24 hours) at 08/06/2017 1408 Last data filed at 08/06/2017 1113 Gross per 24 hour  Intake 1749.17 ml  Output 1030 ml  Net 719.17 ml   Weight change: 6.535 kg (14 lb 6.5 oz)  Physical Exam: GEN lying in bed, sleeping, arousable HEENT sclerae anicteric NECK no JVD PULM clear CV RRR ABD obese, nontender EXT trace LE edema NEURO sleeping but AAo x 3 when awakened SKIN no rashes or lesions     Imaging: Ct Head Wo Contrast  Result Date: 08/05/2017 CLINICAL DATA:  Seizure like activity. History of hypertension and diabetes. EXAM: CT HEAD WITHOUT CONTRAST TECHNIQUE: Contiguous axial images were obtained from the base of the skull through the vertex without intravenous contrast. COMPARISON:  CT HEAD Nov 26, 2013 FINDINGS: BRAIN: No intraparenchymal hemorrhage, mass effect nor midline shift. The ventricles and sulci are normal.  No acute large vascular territory infarcts. No abnormal extra-axial fluid collections. Basal cisterns are patent. VASCULAR: Unremarkable. SKULL/SOFT TISSUES: No skull fracture. No significant soft tissue swelling. Prominent incisive foramen, normal variant ORBITS/SINUSES: The included ocular globes and orbital contents are normal.Mild paranasal sinus mucosal thickening. Mastoid air cells are well aerated. OTHER: Poor dentition. IMPRESSION: Negative noncontrast CT HEAD. Electronically Signed   By: Elon Alas M.D.   On: 08/05/2017 00:32   Dg Chest Port 1 View  Result Date: 08/05/2017 CLINICAL DATA:  Seizure like activity. EXAM: PORTABLE CHEST 1 VIEW COMPARISON:  None. FINDINGS: Cardiac silhouette appears moderate to severely enlarged, mediastinal silhouette is non suspicious. Bronchitic changes without pleural effusion or focal consolidation. No pneumothorax. Large body habitus. Osseous structures are non suspicious. IMPRESSION: Moderate to severe cardiomegaly, potentially accentuated by AP technique. Recommend follow-up PA and lateral views of the chest when clinically able. Mild bronchitic changes without focal consolidation. Electronically Signed   By: Elon Alas M.D.   On: 08/05/2017 00:36    Labs: BMET Recent Labs  Lab 08/04/17 2351 08/05/17 0518 08/05/17 0915 08/05/17 1405 08/05/17 1706  NA 129* 132* 136 135 135  K 3.9 3.7 3.6 3.5 3.6  CL 93* 100* 105 104 104  CO2 21* 21* 22 21* 22  GLUCOSE 615* 517* 212* 211* 248*  BUN 45* 45* 45* 42* 42*  CREATININE 4.23* 3.79* 3.64* 3.32* 3.29*  CALCIUM 8.5* 8.6* 8.9 8.7* 8.8*  PHOS  --  3.7  --   --   --    CBC Recent Labs  Lab 08/04/17 2351  WBC 9.3  NEUTROABS 7.4  HGB 11.4*  HCT 32.9*  MCV 74.8*  PLT 228    Medications:    . amLODipine  10 mg Oral Daily  . atorvastatin  40 mg Oral Daily  . carvedilol  40 mg Oral Daily  . heparin  5,000 Units Subcutaneous Q8H  . hydrALAZINE  10 mg Intravenous Q6H  . insulin aspart   0-5 Units Subcutaneous QHS  . insulin aspart  0-9 Units Subcutaneous TID WC  . insulin glargine  18 Units Subcutaneous Daily  . living well with diabetes book   Does not apply Once      Madelon Lips, MD [place] pgr [phone] 08/06/2017, 2:08 PM

## 2017-08-07 DIAGNOSIS — E669 Obesity, unspecified: Secondary | ICD-10-CM

## 2017-08-07 DIAGNOSIS — E1169 Type 2 diabetes mellitus with other specified complication: Secondary | ICD-10-CM

## 2017-08-07 DIAGNOSIS — R600 Localized edema: Secondary | ICD-10-CM

## 2017-08-07 DIAGNOSIS — N184 Chronic kidney disease, stage 4 (severe): Secondary | ICD-10-CM

## 2017-08-07 LAB — GLUCOSE, CAPILLARY
GLUCOSE-CAPILLARY: 251 mg/dL — AB (ref 65–99)
GLUCOSE-CAPILLARY: 324 mg/dL — AB (ref 65–99)
Glucose-Capillary: 222 mg/dL — ABNORMAL HIGH (ref 65–99)
Glucose-Capillary: 283 mg/dL — ABNORMAL HIGH (ref 65–99)

## 2017-08-07 LAB — BASIC METABOLIC PANEL
ANION GAP: 11 (ref 5–15)
BUN: 35 mg/dL — ABNORMAL HIGH (ref 6–20)
CALCIUM: 8.9 mg/dL (ref 8.9–10.3)
CHLORIDE: 105 mmol/L (ref 101–111)
CO2: 20 mmol/L — AB (ref 22–32)
Creatinine, Ser: 3.23 mg/dL — ABNORMAL HIGH (ref 0.61–1.24)
GFR calc non Af Amer: 21 mL/min — ABNORMAL LOW (ref >60)
GFR, EST AFRICAN AMERICAN: 24 mL/min — AB (ref >60)
Glucose, Bld: 238 mg/dL — ABNORMAL HIGH (ref 65–99)
POTASSIUM: 3.7 mmol/L (ref 3.5–5.1)
Sodium: 136 mmol/L (ref 135–145)

## 2017-08-07 MED ORDER — INSULIN GLARGINE 100 UNIT/ML ~~LOC~~ SOLN
21.0000 [IU] | Freq: Every day | SUBCUTANEOUS | Status: DC
  Administered 2017-08-07 – 2017-08-08 (×2): 21 [IU] via SUBCUTANEOUS
  Filled 2017-08-07 (×2): qty 0.21

## 2017-08-07 MED ORDER — INSULIN GLARGINE 100 UNIT/ML ~~LOC~~ SOLN: 21.0000 [IU] | Freq: Every day | SUBCUTANEOUS | Status: DC

## 2017-08-07 MED ORDER — HYDRALAZINE HCL 10 MG PO TABS
10.0000 mg | ORAL_TABLET | Freq: Three times a day (TID) | ORAL | Status: DC
  Administered 2017-08-07 – 2017-08-08 (×4): 10 mg via ORAL
  Filled 2017-08-07 (×4): qty 1

## 2017-08-07 MED ORDER — INSULIN STARTER KIT- PEN NEEDLES (ENGLISH)
1.0000 | Freq: Once | Status: AC
  Administered 2017-08-07: 1
  Filled 2017-08-07: qty 1

## 2017-08-07 NOTE — Progress Notes (Signed)
  [facility] Progress Note    Assessment/ Plan:   1.  AKI on CKD: likely due to hypovolemia from osmotic diuresis from HHS.  Improved with fluids  Avoid nephrotoxins and hypotension.  Not much to add at this point.  Will reschedule appt with Dr. Florene Glen from 1/18.  Will likely be able to resume furosemide at d/c esp in light of #4.  Would hold ACEi until nephrology f/u.  2.  HHS: resolved, per primary.  Insulin regimen is getting adjusted.  CDE recommending at least 20 u insulin  3.  HTN: on norvasc 10 mg daily, coreg CR 40 mg daily.  BP still elevated.  Would use hydralazine given new EF of 45-50%; would hold ACEi for now until acute issues resolved.   4.  New systolic CHF: EF 19-50%, diffuse hypokinesis.  Per primary.  Could refer to cardiology as OP for ischemic w/u (no CP, trop 0.03)  5.  Seizures: per neurology.  No AEDs at present.  We will sign off.  Please don't hesitate to contact with any questions or concerns.    Subjective:    Cr plateaued.  Having "starting spells" per family.     Objective:   BP (!) 163/87 (BP Location: Right Arm)   Pulse 92   Temp 98.3 F (36.8 C) (Oral)   Resp 18   Ht 5\' 8"  (1.727 m)   Wt (!) 137.3 kg (302 lb 9.6 oz)   SpO2 96%   BMI 46.01 kg/m   Intake/Output Summary (Last 24 hours) at 08/07/2017 1232 Last data filed at 08/07/2017 0536 Gross per 24 hour  Intake 604.78 ml  Output 1275 ml  Net -670.22 ml   Weight change: -3.541 kg (-12.9 oz)  Physical Exam: GEN lying in bed, sleeping, arousable HEENT sclerae anicteric NECK no JVD PULM clear CV RRR ABD obese, nontender EXT 1+ LE edema NEURO sleeping but AAo x 3 when awakened SKIN no rashes or lesions     Imaging: No results found.  Labs: BMET Recent Labs  Lab 08/04/17 2351 08/05/17 0518 08/05/17 0915 08/05/17 1405 08/05/17 1706 08/07/17 0622  NA 129* 132* 136 135 135 136  K 3.9 3.7 3.6 3.5 3.6 3.7  CL 93* 100* 105 104 104 105  CO2 21* 21* 22 21*  22 20*  GLUCOSE 615* 517* 212* 211* 248* 238*  BUN 45* 45* 45* 42* 42* 35*  CREATININE 4.23* 3.79* 3.64* 3.32* 3.29* 3.23*  CALCIUM 8.5* 8.6* 8.9 8.7* 8.8* 8.9  PHOS  --  3.7  --   --   --   --    CBC Recent Labs  Lab 08/04/17 2351  WBC 9.3  NEUTROABS 7.4  HGB 11.4*  HCT 32.9*  MCV 74.8*  PLT 228    Medications:    . amLODipine  10 mg Oral Daily  . atorvastatin  40 mg Oral Daily  . carvedilol  40 mg Oral Daily  . heparin  5,000 Units Subcutaneous Q8H  . hydrALAZINE  10 mg Oral Q8H  . insulin aspart  0-5 Units Subcutaneous QHS  . insulin aspart  0-9 Units Subcutaneous TID WC  . insulin glargine  21 Units Subcutaneous Daily  . polyethylene glycol  17 g Oral Daily      Madelon Lips, MD [place] pgr [phone] 08/07/2017, 12:32 PM

## 2017-08-07 NOTE — Progress Notes (Signed)
Family Medicine Teaching Service Daily Progress Note Intern Pager: [phone]  Patient name: Jeremy [name] Medical record number: [id number] Date of birth: July 28, 1965 Age: 52 y.o. Gender: male  Primary Care Provider: [place] Bing, DO Consultants: none Code Status: full  Pt Overview and Major Events to Date:  1/15 admitted to [facility], transitioned off insulin gtt 1/16 echo and eeg performed  Assessment and Plan: Seizure No seizure-like activity since admission. Family does report that patient is having intermittent spells where he "just stares into space" and becomes confused. EEG without abnormality. CT head negative. Unclear what is causing these episodes but suspect it might be related to his seizures on presentation and hypergylcemia. Have asked family to record these episodes if they occur again to gain better understanding. - vital signs per stepdown routine - seizure precautions - PT/OT - continue to correct hyperglycemia (as below) - replete electrolytes as needed - prn ativan for seizure  Hyperglycemia  Hyperosmolar Non-ketotic  Hyperglycemia likely due to combination of medication non-compliance and poor lifestyle modifications. A1C >15.5. Lantus 18U and 16U aspart last 24 hours. Last 3 sugars 258, 238, 222. Will increase lantus to 21U and continue sliding scale. Hopefully discharge 1/18 if sugars remain stable. Un[employer] will be a good option for him on discharge and it appears his insurance will cover this. Given his non-compliance a long-acting that last more than 24 hours is a great fit for him. Not on formulary in the hospital. Will plan to discharge him with this. - continue to trend cbg - lantus 21U, sSSI - treciba on discharge - daily bmp - diabetic education - prescribe freestyle libre, needs a sensor prescribed at discharge  AKI on CKD IV Patient's baseline cr appears to be ~2.8-3.0 Cr 4.23 at admission. Likely pre-renal due to dehydration. Patient also with bp  210/130 while in ed. HTN could also be contributing. Patient's creatinine down to 3.2. Inching closer to baseline. GFR is 24. Nephrology following appreciate their recs. Will continue to hold home lisinopril until has stable cr and gfr as outpatient per nephrology recommendations. - saline lock iv - continue holding lisinopril and furosemide - holding lisinopril and furosemide - daily bmp  HLD Cholesterol 171 in 2017. Continue atorvastatin 40mg  daily  HTN BP significantly elevated in ed up to 210/130. Has improved with most recent bp into the 737T systolic. Will continue home amlodipine, coreg. Holding home lisinopril and furosemide due to aki. Per discussion with nephrology, recommended this continue to be held until gfr and cr stabilize as outpatient. - continue home amlodipine, coreg - scheduled hydralazine 10mg  prn - can consider increasing dose if sbp doesn't decrease with fluid stop - goal is 130/90 to prevent further renal damage  Mixed systolic and diastolic CHF Patient with 2+ pitting edema in BLE. Cardiomegaly noted on cxr. Echo with 45-50% EF. Qualifies for mixed chf. Unable to assess PA pressure. Already on ACE, Beta blocker, CCb, and Loop diuretic. Holding ACE and loop due to kidney function. - consider outpatient follow up with cardiology  FEN/GI: carb-modified PPx: heparin 5000U  Disposition: home  Subjective:  Doing well overnight. Had repetitive speech and odd "staring" spells per his family. Asked for them to take video of this if it happens again. AOx4. No distress. Wants to go home.  Objective: Temp:  [98 F (36.7 C)-98.5 F (36.9 C)] 98.3 F (36.8 C) (01/17 0527) Pulse Rate:  [72-92] 92 (01/17 0904) Resp:  [15-18] 18 (01/17 0527) BP: (101-176)/(56-88) 163/87 (01/17 0904) SpO2:  [96 %-  98 %] 96 % (01/17 0527) Weight:  [302 lb 9.6 oz (137.3 kg)] 302 lb 9.6 oz (137.3 kg) (01/17 0527) Physical Exam: General: obese, african [place] male,  AOx3 Eyes: eomi,  perrl, no conjunctival injection Cardiovascular: rrr, no m/r/g, 2+pitting edema BLE Respiratory: lungs clear to auscultation, exam limited by body habitus Gastrointestinal: soft, non-tender, non-distended. Bowel sounds present. Umbilical hernia present. Somewhat tender to palpation in LLQ. MSK: 5/5 strength BUE, BLE Derm: warm and dry Neuro: aox3, cn 2-12 intact. NO focal neuro deficit Psych: sleepy but appropriate  Laboratory: Recent Labs  Lab 08/04/17 2351  WBC 9.3  HGB 11.4*  HCT 32.9*  PLT 228   Recent Labs  Lab 08/04/17 2351  08/05/17 1405 08/05/17 1706 08/07/17 0622  NA 129*   < > 135 135 136  K 3.9   < > 3.5 3.6 3.7  CL 93*   < > 104 104 105  CO2 21*   < > 21* 22 20*  BUN 45*   < > 42* 42* 35*  CREATININE 4.23*   < > 3.32* 3.29* 3.23*  CALCIUM 8.5*   < > 8.7* 8.8* 8.9  PROT 6.6  --   --   --   --   BILITOT 0.9  --   --   --   --   ALKPHOS 104  --   --   --   --   ALT 20  --   --   --   --   AST 27  --   --   --   --   GLUCOSE 615*   < > 211* 248* 238*   < > = values in this interval not displayed.   Imaging/Diagnostic Tests: CLINICAL DATA:  Seizure like activity. History of hypertension and diabetes.  EXAM: CT HEAD WITHOUT CONTRAST  TECHNIQUE: Contiguous axial images were obtained from the base of the skull through the vertex without intravenous contrast.  COMPARISON:  CT HEAD Nov 26, 2013  FINDINGS: BRAIN: No intraparenchymal hemorrhage, mass effect nor midline shift. The ventricles and sulci are normal. No acute large vascular territory infarcts. No abnormal extra-axial fluid collections. Basal cisterns are patent.  VASCULAR: Unremarkable.  SKULL/SOFT TISSUES: No skull fracture. No significant soft tissue swelling. Prominent incisive foramen, normal variant  ORBITS/SINUSES: The included ocular globes and orbital contents are normal.Mild paranasal sinus mucosal thickening. Mastoid air cells are well aerated.  OTHER: Poor  dentition.  IMPRESSION: Negative noncontrast CT HEAD.  Guadalupe Dawn, MD 08/07/2017, 11:59 AM PGY-1, [place] Intern pager: [phone], text pages welcome

## 2017-08-07 NOTE — Progress Notes (Addendum)
Inpatient Diabetes Program Recommendations  AACE/ADA: New Consensus Statement on Inpatient Glycemic Control (2015)  Target Ranges:  Prepandial:   less than 140 mg/dL      Peak postprandial:   less than 180 mg/dL (1-2 hours)      Critically ill patients:  140 - 180 mg/dL   Lab Results  Component Value Date   GLUCAP 222 (H) 08/07/2017   HGBA1C >15.5 (H) 08/05/2017    Review of Glycemic ControlResults for Jeremy [name], Jeremy [name] (MRN [id number]) as of 08/07/2017 12:40  Ref. Range 08/06/2017 07:53 08/06/2017 12:13 08/06/2017 17:18 08/06/2017 22:25 08/07/2017 08:20  Glucose-Capillary Latest Ref Range: 65 - 99 mg/dL 275 (H) 292 (H) 238 (H) 258 (H) 222 (H)   Results for Jeremy [name], Jeremy [name] (MRN [id number]) as of 08/07/2017 12:40  Ref. Range 02/09/2016 10:30 05/31/2016 08:38 01/31/2017 15:14 04/17/2017 15:48 08/05/2017 05:18  Hemoglobin A1C Latest Ref Range: 4.8 - 5.6 % 6.4 8.9 7.7 8.1 >15.5 (H)   Diabetes history: Type 2 DM Outpatient Diabetes medications: None Patient was on insulin in 2015 however this was stopped.  Metformin restarted in August, 2018 but then discontinued in September, 2018 due to renal function.  Current orders for Inpatient glycemic control:  Lantus 21 units daily, Novolog sensitive tid with meals and HS Inpatient Diabetes Program Recommendations:    Talked with patient and wife again today.  Patient was on insulin using vial and syringe in the past however this was stopped with improved A1C results. A1C increased in 2018 and wife states that patient was started on Metformin but his had to be stopped due to his kidneys.  Patient is interested in insulin pen for insulin delivery.  According to note, MD considering [place].  Will show patient/family insulin pen this afternoon as well.  Called and discussed patient with resident.  He may be a good candidate for "Freestyle Libre" sensor for glucose monitoring.  In order to order this for outpatient use, please use WUX#[id number] (after visit order)- Please  order 3 or 9 glucose sensors which will last 1-3 months.   Thanks,  Adah Perl, RN, BC-ADM Inpatient Diabetes Coordinator Pager [phone] (8a-5p)

## 2017-08-07 NOTE — Progress Notes (Signed)
Family Medicine Progress update  Witness one of patient's spells. Patient was in recliner in usual state. Suddenly he yelled out "no!", shot out of recliner and attempted to leave room. Able to be re-oriented by his girlfriend. Says that he remembers the whole event but doesn't recall thinking anything during the episode. Just a blank sensation like he was compelled to run out of room. After stopping walking he stared into space for a few seconds and had a slight facial twitch in left cheek. He had no post-ictal state. He was aox3, knew date, able to count to 10. Patient returned to usual state within 10 seconds of start of spell. Will continue to monitor, could consider longer prolonged EEG although this doesn't appear to be a seizure. Apparently these are improving from his presentation per his wife.  Guadalupe Dawn MD PGY-1 Family Medicine Resident

## 2017-08-08 DIAGNOSIS — R569 Unspecified convulsions: Secondary | ICD-10-CM

## 2017-08-08 DIAGNOSIS — N179 Acute kidney failure, unspecified: Secondary | ICD-10-CM

## 2017-08-08 DIAGNOSIS — E870 Hyperosmolality and hypernatremia: Secondary | ICD-10-CM

## 2017-08-08 DIAGNOSIS — R739 Hyperglycemia, unspecified: Secondary | ICD-10-CM

## 2017-08-08 LAB — BASIC METABOLIC PANEL
ANION GAP: 12 (ref 5–15)
BUN: 40 mg/dL — AB (ref 6–20)
CALCIUM: 9.1 mg/dL (ref 8.9–10.3)
CO2: 21 mmol/L — ABNORMAL LOW (ref 22–32)
Chloride: 106 mmol/L (ref 101–111)
Creatinine, Ser: 3.5 mg/dL — ABNORMAL HIGH (ref 0.61–1.24)
GFR calc Af Amer: 22 mL/min — ABNORMAL LOW (ref >60)
GFR, EST NON AFRICAN AMERICAN: 19 mL/min — AB (ref >60)
GLUCOSE: 214 mg/dL — AB (ref 65–99)
Potassium: 4.2 mmol/L (ref 3.5–5.1)
Sodium: 139 mmol/L (ref 135–145)

## 2017-08-08 LAB — GLUCOSE, CAPILLARY
Glucose-Capillary: 237 mg/dL — ABNORMAL HIGH (ref 65–99)
Glucose-Capillary: 270 mg/dL — ABNORMAL HIGH (ref 65–99)

## 2017-08-08 MED ORDER — FREESTYLE LIBRE SENSOR SYSTEM MISC: 3.0000 [IU] | Freq: Once | 0 refills | Status: AC

## 2017-08-08 MED ORDER — HYDRALAZINE HCL 10 MG PO TABS: 20.0000 mg | ORAL_TABLET | Freq: Three times a day (TID) | ORAL | 0 refills | Status: DC

## 2017-08-08 MED ORDER — INSULIN ASPART 100 UNIT/ML ~~LOC~~ SOLN: 0.0000 [IU] | Freq: Every day | SUBCUTANEOUS | 11 refills | Status: DC

## 2017-08-08 MED ORDER — INSULIN ASPART 100 UNIT/ML ~~LOC~~ SOLN: 0.0000 [IU] | Freq: Three times a day (TID) | SUBCUTANEOUS | 11 refills | Status: DC

## 2017-08-08 MED ORDER — INSULIN DEGLUDEC 100 UNIT/ML ~~LOC~~ SOPN: 25.0000 [IU] | PEN_INJECTOR | Freq: Every day | SUBCUTANEOUS | 0 refills | Status: AC

## 2017-08-08 NOTE — Care Management Note (Signed)
Case Management Note  Patient Details  Name: Jeremy [name] MRN: [id number] Date of Birth: Aug 19, 1965  Subjective/Objective:  Admitted for Hyperosmolar syndrome.                 Action/Plan: Prior to admission patient lived at home with girlfriend.  PCP noted.  Patient with discharge orders for today.  No discharge needs noted.  Expected Discharge Date:  08/08/17               Expected Discharge Plan:  Home/Self Care  In-House Referral:  NA  Discharge planning Services  CM Consult  Status of Service:  Completed, signed off  Kristen Cardinal, BSN, RN Nurse Case Manager [phone] 08/08/2017, 3:21 PM

## 2017-08-08 NOTE — Progress Notes (Signed)
Patient has an episode of yelling "no" and sitting up in bed, patient then laid down in bed and had left facial twitching lasting about 40 seconds. Patients eyes were open and was alert X3 after episode. Family at bedside. NAD noted.

## 2017-08-08 NOTE — Progress Notes (Signed)
Pt has had two episode of pseudo-seizure. The first one lasted for 45 seconds and was wittnessed by PT. MD notified, she stated the tests done did not indicate any seizure, and will not be prescribing any medicine for it.Jeremy [name] second lasted for 20 seconds. Pt is resting in room now. Will continue to monitor

## 2017-08-08 NOTE — Progress Notes (Signed)
Inpatient Diabetes Program Recommendations  AACE/ADA: New Consensus Statement on Inpatient Glycemic Control (2015)  Target Ranges:  Prepandial:   less than 140 mg/dL      Peak postprandial:   less than 180 mg/dL (1-2 hours)      Critically ill patients:  140 - 180 mg/dL   Results for Jeremy [name], Jeremy [name] (MRN [id number]) as of 08/08/2017 11:11  Ref. Range 08/07/2017 08:20 08/07/2017 12:56 08/07/2017 17:08 08/07/2017 21:02 08/08/2017 08:06  Glucose-Capillary Latest Ref Range: 65 - 99 mg/dL 222 (H) 251 (H) 324 (H) 283 (H) 237 (H)   Review of Glycemic Control  Inpatient Diabetes Program Recommendations:    Glucose in the 200-300 range. Please increase Lantus dose to 26 units.  Thanks,  Tama Headings RN, MSN, [facility] Inpatient Diabetes Coordinator Team Pager [phone] (8a-5p)

## 2017-08-08 NOTE — Discharge Summary (Signed)
[facility] Discharge Summary  Patient name: Jeremy [name] Medical record number: [id number] Date of birth: Mar 06, 1966 Age: 52 y.o. Gender: male Date of Admission: 08/04/2017  Date of Discharge: 08/08/2017 Admitting Physician: Nicolette Bang, DO  Primary Care Provider:  Bing, DO Consultants: none  Indication for Hospitalization: seizures, HHS  Discharge Diagnoses/Problem List:  Seizure Hyperglycemia Hyperosmolar Non-Ketotic syndrome AKI on CKD IV HLD HTN Mixed Systolic and Diastolic CHF  Disposition: home  Discharge Condition: stable  Discharge Exam:  General:obese, african [place] male,  AOx3 Eyes:eomi, perrl, no conjunctival injection Cardiovascular:rrr, no m/r/g, 2+pitting edema BLE Respiratory:lungs clear to auscultation, exam limited by body habitus Gastrointestinal:soft, non-tender, non-distended. Bowel sounds present. Umbilical hernia present. Somewhat tender to palpation in LLQ. MSK:5/5 strength BUE, BLE Derm:warm and dry Neuro:aox3, cn 2-12 intact. NO focal neuro deficit Psych:sleepy but appropriate  Brief Hospital Course:  Type II diabetes 52 year old male who presented on 1/15. He was found to have seizures at his home that showed left sided spasticity, and shaking. On presentation a ct head was obtained which showed no abnormality. His blood sugar was noted to be in 600s. Of note patient was only on metformin XR as an outpatient. This was stopped due to ineffectiveness and his kidney function. He was admitted and placed on an insulin drip. He was transitioned to lantus and sSSI on 1/15. His insulin was slowly titrated up to 25U prior to discharge. His A1C was noted to be >15.5.  Seizures/Pseudoseizures Had the seizure-like symptoms as above. CK in 600s on admission. Received ativan and keppra after a witnessed episode in ed. EEG and head CT normal. During admission patient had multiple episodes of yelling  "no!" and jumping out of chair and being in trance-like state. These usually resolved within 10 seconds. He was AOx3 and had full memory of these events each time. It was felt that these were pseudoseizures which would resolve on their own. The patient and family were comfortable leaving and he was deemed stable for discharge on 1/18.  AKI on CKD IV Patient has known CKD stage IV. His creatinine was noted to 4.23 on admission. His baseline was known to be around 3. His aki was felt to be pre-renal. Nephrology was consulted as there had been discussion about dialysis in the past. Nephrology felt that dialysis was not indicated and agreed with course of treatment. They recommended holding lisinopril and furosemide until his kidney function stablized. Recommended check of bmp as outpatient and can likely restart if kidney function stable at hospital follow-up.  Hypertension BP of 210/130 on admission. Patient already on large dose of amlodipine and carvedilol. Held his home lisinopril and furosemide (as above). Started on hydralazine oral which did correct blood pressure down into 140s. His discharge dose was 43m tid.  Issues for Follow Up:  1. Follow up blood sugar control on tresiba 2. Check if patient picked up freestyle libre sensor 3. Follow up blood pressure control on new regimen 4. Follow up whether his pseudoseizures have stopped 5. Check a BMP to monitor kidney function  Significant Procedures:   Significant Labs and Imaging:  Recent Labs  Lab 08/04/17 2351  WBC 9.3  HGB 11.4*  HCT 32.9*  PLT 228   Recent Labs  Lab 08/04/17 2351 08/05/17 0518 08/05/17 0915 08/05/17 1405 08/05/17 1706 08/07/17 0622 08/08/17 0412  NA 129* 132* 136 135 135 136 139  K 3.9 3.7 3.6 3.5 3.6 3.7 4.2  CL 93* 100* 105  104 104 105 106  CO2 21* 21* 22 21* 22 20* 21*  GLUCOSE 615* 517* 212* 211* 248* 238* 214*  BUN 45* 45* 45* 42* 42* 35* 40*  CREATININE 4.23* 3.79* 3.64* 3.32* 3.29* 3.23* 3.50*   CALCIUM 8.5* 8.6* 8.9 8.7* 8.8* 8.9 9.1  MG  --  1.8  --   --   --   --   --   PHOS  --  3.7  --   --   --   --   --   ALKPHOS 104  --   --   --   --   --   --   AST 27  --   --   --   --   --   --   ALT 20  --   --   --   --   --   --   ALBUMIN 3.0*  --   --   --   --   --   --    Results/Tests Pending at Time of Discharge:  Discharge Medications:  Allergies as of 08/08/2017   No Known Allergies     Medication List    STOP taking these medications   Adult Blood Pressure Cuff Lg Kit   furosemide 40 MG tablet Commonly known as:  LASIX   lisinopril 10 MG tablet Commonly known as:  PRINIVIL,ZESTRIL     TAKE these medications   amLODipine 10 MG tablet Commonly known as:  NORVASC Take 1 tablet (10 mg total) by mouth daily.   atorvastatin 40 MG tablet Commonly known as:  LIPITOR Take 1 tablet (40 mg total) by mouth daily.   carvedilol 20 MG 24 hr capsule Commonly known as:  COREG CR Take 2 capsules (40 mg total) by mouth daily.   cyclobenzaprine 5 MG tablet Commonly known as:  FLEXERIL Take 1 tablet (5 mg total) by mouth 3 (three) times daily as needed for muscle spasms.   hydrALAZINE 10 MG tablet Commonly known as:  APRESOLINE Take 2 tablets (20 mg total) by mouth every 8 (eight) hours.   insulin aspart 100 UNIT/ML injection Commonly known as:  novoLOG Inject 0-5 Units into the skin at bedtime.   insulin aspart 100 UNIT/ML injection Commonly known as:  novoLOG Inject 0-9 Units into the skin 3 (three) times daily with meals.   insulin degludec 100 UNIT/ML Sopn FlexTouch Pen Commonly known as:  TRESIBA Inject 0.25 mLs (25 Units total) into the skin daily at 10 pm for 14 days.       Discharge Instructions: Please refer to Patient Instructions section of EMR for full details.  Patient was counseled important signs and symptoms that should prompt return to medical care, changes in medications, dietary instructions, activity restrictions, and follow up  appointments.   Follow-Up Appointments: Dr. Lovenia Kim 08/14/2017  Guadalupe Dawn, MD 08/08/2017, 3:32 PM PGY-1, [place]

## 2017-08-08 NOTE — Progress Notes (Signed)
Physical Therapy Treatment Patient Details Name: Jeremy [name] MRN: [id number] DOB: 12-Jun-1966 Today's Date: 08/08/2017    History of Present Illness Pt is a 52 y/o male admitted secondary to AMS, seizure activity and elevated blood glucose. Pt's CBG found to be over 500. CT of the head negative for acute abnormality. PMH includes HTN, DM, and bells palsy.     PT Comments    Excellent progress with mobility. Pt ambulated 500 feet without AD supervision. Steady gait noted.     Follow Up Recommendations  No PT follow up;Supervision/Assistance - 24 hour     Equipment Recommendations  None recommended by PT    Recommendations for Other Services       Precautions / Restrictions Precautions Precautions: Other (comment) Precaution Comments: seizure precautions  Restrictions Weight Bearing Restrictions: No    Mobility  Bed Mobility               General bed mobility comments: Pt received in recliner.  Transfers Overall transfer level: Independent Equipment used: None   Sit to Stand: Independent            Ambulation/Gait Ambulation/Gait assistance: Supervision Ambulation Distance (Feet): 500 Feet Assistive device: None Gait Pattern/deviations: Step-through pattern Gait velocity: normal Gait velocity interpretation: >2.62 ft/sec, indicative of independent community ambulator General Gait Details: steady Writer    Modified Rankin (Stroke Patients Only)       Balance Overall balance assessment: Independent Sitting-balance support: No upper extremity supported;Feet supported Sitting balance-Leahy Scale: Good     Standing balance support: No upper extremity supported;During functional activity Standing balance-Leahy Scale: Good                              Cognition Arousal/Alertness: Awake/alert Behavior During Therapy: Flat affect Overall Cognitive Status: Within Functional Limits for tasks  assessed Area of Impairment: Following commands                       Following Commands: Follows one step commands consistently              Exercises      General Comments        Pertinent Vitals/Pain Pain Assessment: No/denies pain Faces Pain Scale: Hurts a little bit Pain Location: reports headache Pain Descriptors / Indicators: Aching Pain Intervention(s): Limited activity within patient's tolerance;Monitored during session    Home Living                      Prior Function            PT Goals (current goals can now be found in the care plan section) Acute Rehab PT Goals Patient Stated Goal: to go home PT Goal Formulation: With patient Time For Goal Achievement: 08/19/17 Potential to Achieve Goals: Good Progress towards PT goals: Progressing toward goals    Frequency    Min 3X/week      PT Plan Current plan remains appropriate    Co-evaluation              AM-PAC PT "6 Clicks" Daily Activity  Outcome Measure  Difficulty turning over in bed (including adjusting bedclothes, sheets and blankets)?: A Little Difficulty moving from lying on back to sitting on the side of the bed? : A Little Difficulty sitting down on and standing up from  a chair with arms (e.g., wheelchair, bedside commode, etc,.)?: A Little Help needed moving to and from a bed to chair (including a wheelchair)?: None Help needed walking in hospital room?: None Help needed climbing 3-5 steps with a railing? : A Little 6 Click Score: 20    End of Session Equipment Utilized During Treatment: Gait belt Activity Tolerance: Patient tolerated treatment well Patient left: in chair;with call bell/phone within reach;with family/visitor present Nurse Communication: Mobility status PT Visit Diagnosis: Unsteadiness on feet (R26.81);Difficulty in walking, not elsewhere classified (R26.2)     Time: 4709-2957 PT Time Calculation (min) (ACUTE ONLY): 12 min  Charges:   $Gait Training: 8-22 mins                    G Codes:       Jeremy [name], PT  Office # [phone] Pager [phone]    Jeremy [name] 08/08/2017, 11:51 AM

## 2017-08-08 NOTE — Progress Notes (Signed)
Occupational Therapy Treatment Patient Details Name: Jeremy [name] MRN: [id number] DOB: 1966-06-28 Today's Date: 08/08/2017    History of present illness Pt is a 52 y/o male admitted secondary to AMS, seizure activity and elevated blood glucose. Pt's CBG found to be over 500. CT of the head negative for acute abnormality. PMH includes HTN, DM, and bells palsy.    OT comments  Pt seen for OT treatment.  Pt eager to get up from bedside chair and walk with spouse sitting next to him.  Before getting up she stated that she would bet he's getting ready to have a seizure because he just woke up.  Upon standing with independence, pt became non responsive to therapist questioning, turned his head to the left, started shaking throughout his body, and making vocal noises of heavy breathing.  Both therapist and pt's spouse held onto pt for support.  When attempting to have him sit back down in the bedside chair, he was rigid and resistant to hip flexion.  After appoximately 30 seconds he was able to be sat back in the chair with head to the left and drooling.  He then came out of the episode after another 30 seconds or so and could state place without difficulty.  BP taken at 166/76 in sitting with HR at 74 and oxygen at 96% on room air.   Pt reports that he did not remember the episode but that he doesn't feel any different or fatigued.  Nursing contacted while seizure was occurring, but it was finished by the time she was able to get to the room.   Pt then ambulated down the hallway with independence and completed transfer to and from the toilet at independent level.  Do not feel he warrants any further OT treatment at this time.    Follow Up Recommendations  No OT follow up    Equipment Recommendations  None recommended by OT    Recommendations for Other Services      Precautions / Restrictions Precautions Precautions: Other (comment) Precaution Comments: seizure precautions  Restrictions Weight  Bearing Restrictions: No       Mobility Bed Mobility                  Transfers Overall transfer level: Independent Equipment used: None   Sit to Stand: Independent              Balance Overall balance assessment: Independent                                         ADL either performed or assessed with clinical judgement   ADL Overall ADL's : Independent Eating/Feeding: Independent                       Toilet Transfer: Independent   Toileting- Clothing Manipulation and Hygiene: Independent         General ADL Comments: Pt eager to get up from bedside chair.  Spouse present as well.  She stated that she would bet he's getting ready to have a seizure because he just woke up.  Upon standing with independence, pt turned his head to the left, started shaking throughout his body, and making vocal noises of heavy breathing.  Both therapist and pt's spouse held onto pt for support.  When attempting to have him sit back down in the bedside chair, he was  rigid and resistant to hip flexion.  After appoximately 30 seconds he was able to be sat back in the chair with head to the left and drooling.  He then came out of the episode and could state place without difficulty.  Pt reports that he did not remmber the episode but that he doesn't feel any different or fatigued.  Nursing contacted while seizure was occuring but has finished by the time she was able to get to the room.   Pt then ambulated down the hallway with independence and completed transfer to and from the toilet at independent level.                  Cognition Arousal/Alertness: Lethargic Behavior During Therapy: Flat affect   Area of Impairment: Following commands                       Following Commands: Follows one step commands consistently                      General Comments      Pertinent Vitals/ Pain       Pain Assessment: Faces Faces Pain Scale: Hurts  a little bit Pain Location: reports headache Pain Descriptors / Indicators: Aching Pain Intervention(s): Limited activity within patient's tolerance;Monitored during session            Progress Toward Goals  OT Goals(current goals can now be found in the care plan section)  Progress towards OT goals: Goals met/education completed, patient discharged from [place] All goals met and education completed, patient discharged from OT services       AM-PAC PT "6 Clicks" Daily Activity     Outcome Measure   Help from another person eating meals?: None Help from another person taking care of personal grooming?: None Help from another person toileting, which includes using toliet, bedpan, or urinal?: None Help from another person bathing (including washing, rinsing, drying)?: None Help from another person to put on and taking off regular upper body clothing?: None Help from another person to put on and taking off regular lower body clothing?: None 6 Click Score: 24    End of Session        Activity Tolerance Patient tolerated treatment well   Patient Left in chair;with call bell/phone within reach;with family/visitor present   Nurse Communication Mobility status;Other (comment)(seizure like activity)        Time: 7824-2353 OT Time Calculation (min): 19 min  Charges: OT General Charges $OT Visit: 1 Visit OT Treatments $Therapeutic Activity: 8-22 mins     Lannette Avellino OTR/L 08/08/2017, 10:56 AM

## 2017-08-09 ENCOUNTER — Emergency Department (HOSPITAL_COMMUNITY): Payer: 59

## 2017-08-09 ENCOUNTER — Other Ambulatory Visit: Payer: Self-pay | Admitting: Family Medicine

## 2017-08-09 ENCOUNTER — Observation Stay (HOSPITAL_COMMUNITY)
Admission: EM | Admit: 2017-08-09 | Discharge: 2017-08-11 | DRG: 101 | Disposition: A | Discharge status: Home / Self Care | Payer: 59 | Attending: Family Medicine | Admitting: Family Medicine

## 2017-08-09 ENCOUNTER — Encounter (HOSPITAL_COMMUNITY): Payer: Self-pay | Admitting: Emergency Medicine

## 2017-08-09 DIAGNOSIS — D497 Neoplasm of unspecified behavior of endocrine glands and other parts of nervous system: Secondary | ICD-10-CM | POA: Diagnosis not present

## 2017-08-09 DIAGNOSIS — R4020 Unspecified coma: Secondary | ICD-10-CM | POA: Diagnosis not present

## 2017-08-09 DIAGNOSIS — IMO0002 Reserved for concepts with insufficient information to code with codable children: Secondary | ICD-10-CM | POA: Diagnosis present

## 2017-08-09 DIAGNOSIS — E860 Dehydration: Secondary | ICD-10-CM | POA: Diagnosis not present

## 2017-08-09 DIAGNOSIS — G40901 Epilepsy, unspecified, not intractable, with status epilepticus: Secondary | ICD-10-CM | POA: Diagnosis not present

## 2017-08-09 DIAGNOSIS — E1122 Type 2 diabetes mellitus with diabetic chronic kidney disease: Secondary | ICD-10-CM | POA: Diagnosis present

## 2017-08-09 DIAGNOSIS — I129 Hypertensive chronic kidney disease with stage 1 through stage 4 chronic kidney disease, or unspecified chronic kidney disease: Secondary | ICD-10-CM | POA: Diagnosis not present

## 2017-08-09 DIAGNOSIS — E1165 Type 2 diabetes mellitus with hyperglycemia: Secondary | ICD-10-CM | POA: Diagnosis present

## 2017-08-09 DIAGNOSIS — G4733 Obstructive sleep apnea (adult) (pediatric): Secondary | ICD-10-CM | POA: Diagnosis present

## 2017-08-09 DIAGNOSIS — K029 Dental caries, unspecified: Secondary | ICD-10-CM | POA: Diagnosis not present

## 2017-08-09 DIAGNOSIS — Z794 Long term (current) use of insulin: Secondary | ICD-10-CM | POA: Diagnosis not present

## 2017-08-09 DIAGNOSIS — Z599 Problem related to housing and economic circumstances, unspecified: Secondary | ICD-10-CM | POA: Diagnosis not present

## 2017-08-09 DIAGNOSIS — I10 Essential (primary) hypertension: Secondary | ICD-10-CM

## 2017-08-09 DIAGNOSIS — Z6841 Body Mass Index (BMI) 40.0 and over, adult: Secondary | ICD-10-CM | POA: Diagnosis not present

## 2017-08-09 DIAGNOSIS — G510 Bell's palsy: Secondary | ICD-10-CM | POA: Diagnosis present

## 2017-08-09 DIAGNOSIS — E785 Hyperlipidemia, unspecified: Secondary | ICD-10-CM | POA: Diagnosis not present

## 2017-08-09 DIAGNOSIS — Z23 Encounter for immunization: Secondary | ICD-10-CM | POA: Diagnosis not present

## 2017-08-09 DIAGNOSIS — R569 Unspecified convulsions: Secondary | ICD-10-CM | POA: Diagnosis not present

## 2017-08-09 DIAGNOSIS — N184 Chronic kidney disease, stage 4 (severe): Secondary | ICD-10-CM | POA: Diagnosis present

## 2017-08-09 DIAGNOSIS — Z79899 Other long term (current) drug therapy: Secondary | ICD-10-CM

## 2017-08-09 DIAGNOSIS — K0510 Chronic gingivitis, plaque induced: Secondary | ICD-10-CM | POA: Diagnosis not present

## 2017-08-09 DIAGNOSIS — R531 Weakness: Secondary | ICD-10-CM | POA: Diagnosis not present

## 2017-08-09 DIAGNOSIS — D352 Benign neoplasm of pituitary gland: Secondary | ICD-10-CM | POA: Diagnosis present

## 2017-08-09 DIAGNOSIS — N179 Acute kidney failure, unspecified: Secondary | ICD-10-CM | POA: Diagnosis present

## 2017-08-09 DIAGNOSIS — E1121 Type 2 diabetes mellitus with diabetic nephropathy: Secondary | ICD-10-CM | POA: Diagnosis present

## 2017-08-09 LAB — I-STAT CHEM 8, ED
BUN: 44 mg/dL — ABNORMAL HIGH (ref 6–20)
CALCIUM ION: 1.18 mmol/L (ref 1.15–1.40)
CHLORIDE: 107 mmol/L (ref 101–111)
CREATININE: 3.7 mg/dL — AB (ref 0.61–1.24)
GLUCOSE: 194 mg/dL — AB (ref 65–99)
HCT: 38 % — ABNORMAL LOW (ref 39.0–52.0)
Hemoglobin: 12.9 g/dL — ABNORMAL LOW (ref 13.0–17.0)
Potassium: 4.6 mmol/L (ref 3.5–5.1)
Sodium: 141 mmol/L (ref 135–145)
TCO2: 22 mmol/L (ref 22–32)

## 2017-08-09 LAB — URINALYSIS, ROUTINE W REFLEX MICROSCOPIC
BACTERIA UA: NONE SEEN
Bilirubin Urine: NEGATIVE
GLUCOSE, UA: 150 mg/dL — AB
Ketones, ur: NEGATIVE mg/dL
Leukocytes, UA: NEGATIVE
NITRITE: NEGATIVE
PH: 5 (ref 5.0–8.0)
PROTEIN: 100 mg/dL — AB
Specific Gravity, Urine: 1.017 (ref 1.005–1.030)

## 2017-08-09 LAB — RAPID URINE DRUG SCREEN, HOSP PERFORMED
Amphetamines: NOT DETECTED
Barbiturates: NOT DETECTED
Benzodiazepines: NOT DETECTED
Cocaine: NOT DETECTED
Opiates: NOT DETECTED
Tetrahydrocannabinol: NOT DETECTED

## 2017-08-09 LAB — COMPREHENSIVE METABOLIC PANEL
ALBUMIN: 3.3 g/dL — AB (ref 3.5–5.0)
ALT: 25 U/L (ref 17–63)
AST: 43 U/L — AB (ref 15–41)
Alkaline Phosphatase: 104 U/L (ref 38–126)
Anion gap: 12 (ref 5–15)
BILIRUBIN TOTAL: 0.7 mg/dL (ref 0.3–1.2)
BUN: 51 mg/dL — AB (ref 6–20)
CO2: 18 mmol/L — ABNORMAL LOW (ref 22–32)
Calcium: 9.2 mg/dL (ref 8.9–10.3)
Chloride: 107 mmol/L (ref 101–111)
Creatinine, Ser: 3.66 mg/dL — ABNORMAL HIGH (ref 0.61–1.24)
GFR calc Af Amer: 21 mL/min — ABNORMAL LOW (ref >60)
GFR calc non Af Amer: 18 mL/min — ABNORMAL LOW (ref >60)
GLUCOSE: 195 mg/dL — AB (ref 65–99)
POTASSIUM: 4.6 mmol/L (ref 3.5–5.1)
Sodium: 137 mmol/L (ref 135–145)
TOTAL PROTEIN: 7.2 g/dL (ref 6.5–8.1)

## 2017-08-09 LAB — I-STAT VENOUS BLOOD GAS, ED
ACID-BASE DEFICIT: 3 mmol/L — AB (ref 0.0–2.0)
Bicarbonate: 21.2 mmol/L (ref 20.0–28.0)
O2 SAT: 79 %
TCO2: 22 mmol/L (ref 22–32)
pCO2, Ven: 35.1 mmHg — ABNORMAL LOW (ref 44.0–60.0)
pH, Ven: 7.39 (ref 7.250–7.430)
pO2, Ven: 44 mmHg (ref 32.0–45.0)

## 2017-08-09 LAB — CBC WITH DIFFERENTIAL/PLATELET
BASOS ABS: 0 10*3/uL (ref 0.0–0.1)
BASOS PCT: 0 %
Eosinophils Absolute: 0 10*3/uL (ref 0.0–0.7)
Eosinophils Relative: 0 %
HEMATOCRIT: 37 % — AB (ref 39.0–52.0)
Hemoglobin: 12.3 g/dL — ABNORMAL LOW (ref 13.0–17.0)
Lymphocytes Relative: 7 %
Lymphs Abs: 0.7 10*3/uL (ref 0.7–4.0)
MCH: 25.7 pg — ABNORMAL LOW (ref 26.0–34.0)
MCHC: 33.2 g/dL (ref 30.0–36.0)
MCV: 77.2 fL — ABNORMAL LOW (ref 78.0–100.0)
MONO ABS: 0.8 10*3/uL (ref 0.1–1.0)
Monocytes Relative: 8 %
NEUTROS ABS: 8.9 10*3/uL — AB (ref 1.7–7.7)
NEUTROS PCT: 85 %
Platelets: 268 10*3/uL (ref 150–400)
RBC: 4.79 MIL/uL (ref 4.22–5.81)
RDW: 13.6 % (ref 11.5–15.5)
WBC: 10.4 10*3/uL (ref 4.0–10.5)

## 2017-08-09 LAB — PROTIME-INR
INR: 1.02
PROTHROMBIN TIME: 13.3 s (ref 11.4–15.2)

## 2017-08-09 LAB — I-STAT CG4 LACTIC ACID, ED: Lactic Acid, Venous: 1.66 mmol/L (ref 0.5–1.9)

## 2017-08-09 LAB — I-STAT TROPONIN, ED: Troponin i, poc: 0 ng/mL (ref 0.00–0.08)

## 2017-08-09 MED ORDER — INSULIN ASPART 100 UNIT/ML ~~LOC~~ SOLN: 0.0000 [IU] | Freq: Three times a day (TID) | SUBCUTANEOUS | 11 refills | Status: DC

## 2017-08-09 MED ORDER — INSULIN ASPART 100 UNIT/ML ~~LOC~~ SOLN: 0.0000 [IU] | Freq: Every day | SUBCUTANEOUS | 11 refills | Status: DC

## 2017-08-09 MED ORDER — SODIUM CHLORIDE 0.9 % IV SOLN
1000.0000 mg | Freq: Once | INTRAVENOUS | Status: DC
  Filled 2017-08-09: qty 10

## 2017-08-09 MED ORDER — SODIUM CHLORIDE 0.9 % IV SOLN
INTRAVENOUS | Status: DC
  Administered 2017-08-09: 21:00:00 via INTRAVENOUS

## 2017-08-09 MED ORDER — SODIUM CHLORIDE 0.9 % IV SOLN
1000.0000 mg | Freq: Two times a day (BID) | INTRAVENOUS | Status: DC
  Administered 2017-08-10 (×3): 1000 mg via INTRAVENOUS
  Filled 2017-08-09 (×5): qty 10

## 2017-08-09 MED ORDER — SODIUM CHLORIDE 0.9 % IV SOLN
2000.0000 mg | Freq: Once | INTRAVENOUS | Status: AC
  Administered 2017-08-09: 2000 mg via INTRAVENOUS
  Filled 2017-08-09: qty 40

## 2017-08-09 MED ORDER — LORAZEPAM 2 MG/ML IJ SOLN
INTRAMUSCULAR | Status: AC
  Administered 2017-08-09: 2 mg
  Filled 2017-08-09: qty 2

## 2017-08-09 MED ORDER — LORAZEPAM 2 MG/ML IJ SOLN
INTRAMUSCULAR | Status: AC
  Administered 2017-08-09: 2 mg
  Filled 2017-08-09: qty 1

## 2017-08-09 NOTE — Consult Note (Signed)
Neurology Consultation Reason for Consult: Seizures Referring Physician: Dr Reather Converse   History is obtained from:Wife and patient   HPI: Jeremy [name] is a 52 y.o. male with PMH of insulin dependent DM, left Bells palsy. CKD, HTN, HLD and seizures related to hyperglycemia and "?psdeoseizures". He presents to [facility] ER after having 7 witnessed seizures at home that began around 2pm and 3 enroute. At home he would regain counsciousness  Between episodes, however has not been unresponsive in ER between seizures per EDP. Noted to have left side weakness.  He had another seizure witnessed in ER and received 4mg  of Ativan and was loaded with Fosphenytoin. He was protecting airway was not intubated,  On initial evaluation, patient unresponsive, no longer seizing. On repeat assessment an hour later patient states his name and followed simple commands, but still plegic in left arm.   HLK:TGYBWL to obtain due to altered mental status.   Past Medical History:  Diagnosis Date  . Abscess   . AKI (acute kidney injury) ([place]) 08/05/2017  . Bell's palsy   . CKD (chronic kidney disease)    Dr. McDiarmind   . High cholesterol   . Hypertension   . Left shoulder pain 10/26/2013   S/p injection on 10/26/13   . Renal insufficiency 02/14/2014  . Seizures ([place]) 08/04/2017   "related to high blood sugars" (08/05/2017)  . Type II diabetes mellitus (HCC)      Family History  Problem Relation Age of Onset  . Diabetes Mother   . Heart disease Mother 33  . Diabetes Sister   . Diabetes Brother   . Diabetes Brother   . Stroke Neg Hx   . Cancer Neg Hx      Social History:  reports that  has never smoked. he has never used smokeless tobacco. He reports that he does not drink alcohol or use drugs.   Exam: Current vital signs: BP (!) 178/107   Pulse 84   Resp 18   SpO2 99%  Vital signs in last 24 hours: Pulse Rate:  [84-89] 84 (01/19 2102) Resp:  [15-18] 18 (01/19 2102) BP: (143-178)/(85-107) 178/107 (01/19  2102) SpO2:  [93 %-99 %] 99 % (01/19 2102)   Physical Exam  Constitutional: Appears well-developed and well-nourished.  Psych: Affect appropriate to situation Eyes: No scleral injection HENT: No OP obstrucion Head: Normocephalic.  Cardiovascular: Normal rate and regular rhythm.  Respiratory: Effort normal, non-labored breathing GI: Soft.  No distension. There is no tenderness.  Skin: WDI  Neuro: Mental Status: Patient is drowsy. Following commands and oriented to place and person.  Cranial Nerves: II: Visual Fields are full. Pupils are equal, round, and reactive to light.   III,IV, VI: EOMI without ptosis or diploplia. No gaze preference/forced deviation  V: Facial sensation is symmetric to temperature VII: Facial movement is symmetric.  VIII: hearing is intact to voice X: Uvula elevates symmetrically XI: Shoulder shrug is symmetric. XII: tongue is midline without atrophy or fasciculations.  Motor: Tone is normal. Bulk is normal. Left arm and leg are both flaccid, moving right arm and leg with good strength  Sensory: Sensation is symmetric to light touch and temperature in the arms and legs. Deep Tendon Reflexes: 2+ and symmetric in the biceps and patellae.  Plantars: Toes are downgoing bilaterally.  Cerebellar: No gross ataxia   ASSESSMENT AND PLAN  Jeremy [name] is a 52 y.o. male with PMH of insulin dependent DM, left Bells palsy. CKD, HTN, HLD and seizures thought to be related  to hyperglycemia. Was recently admitted for notes it appears the patient had multiple re-seizures with no prolonged postictal period and this was thought to be pseudoseizures. However the wife states that his first seizure was on Monday and following which he was dragging his left foot. Prior notes also indicate that he may have had focal seizures affecting his left side. Neurology is not evaluated this patient in the past and he is not on any seizure medications.  Status Epilepticus-  resolved  CT head negative Loaded with Fosphenytoin IV load Will switch  Keppra 1g BID for maintenance dosing due to better tolerance and lower interaction with medications Will obtain EEG tomorrow  Will need MRI brain w/wo contrast  Close monitoring  Seizure precautions  Treat underlying metabolic conditions  Sushanth Aroor MD [facility] [phone]  If 7pm to 7am, please call on call as listed on AMION.

## 2017-08-09 NOTE — ED Notes (Signed)
Patient transported to CT with RN 

## 2017-08-09 NOTE — Consult Note (Signed)
Name: Jeremy [name] MRN: [id number] DOB: 05/16/1966    ADMISSION DATE:  08/09/2017 CONSULTATION DATE:  08/09/2017  REFERRING MD :  Dr. Lorraine Lax  CHIEF COMPLAINT:  Seizures   HISTORY OF PRESENT ILLNESS:  52 year old male with PMH of 2 DM, Seizures, CKD IV (Baseline Crt 3.0), HTN, Mixed Systolic and Diastolic HF (EF 68-34), Bell's palsy.   Recent admission 1/15-1/18 Seizures with hyperglycemia. During admission multiple documented episodes of left side spasticity and shaking with confusion and grogginess. Determined that these were Pseudoseizures related to hyperglycemia/HTN.  On 1/19 patient presented to ED with reported 7 witness seizures like episodes where the patient would have 30-45 second intervals of left side gaze, shaking, and unresponsiveness. After each episode patient would be groggy and then slowly become alert and oriented. When EMS arrived to the home, patient had one witnessed seizure and two additional in the ED. Ativan and Fosphenytoin given. CT Head Negative. Neurology consulted. PCCM asked to consult.   SIGNIFICANT EVENTS  1/19 > Presents to ED   STUDIES:  EEG 1/16 > The record is symmetric.  During drowsiness and sleep, there is an increase in theta and delta slowing of the background, with shifting asymmetry over the bilateral temporal regions. Deeper stages of sleep were not seen. Photic stimulation did not elicit any abnormalities.  There were no epileptiform discharges or electrographic seizures seen.   CT Head 1/19 > No acute   PAST MEDICAL HISTORY :   has a past medical history of Abscess, AKI (acute kidney injury) ([place]) (08/05/2017), Bell's palsy, CKD (chronic kidney disease), High cholesterol, Hypertension, Left shoulder pain (10/26/2013), Renal insufficiency (02/14/2014), Seizures ([place]) (08/04/2017), and Type II diabetes mellitus ([place]).  has a past surgical history that includes I&D extremity (11/11/2011). Prior to Admission medications   Medication Sig Start Date  End Date Taking? Authorizing Provider  amLODipine (NORVASC) 10 MG tablet Take 1 tablet (10 mg total) by mouth daily. 01/31/17   Cana Bing, DO  atorvastatin (LIPITOR) 40 MG tablet Take 1 tablet (40 mg total) by mouth daily. 03/03/17   Trinway Bing, DO  carvedilol (COREG CR) 20 MG 24 hr capsule Take 2 capsules (40 mg total) by mouth daily. 05/02/17    Bing, DO  cyclobenzaprine (FLEXERIL) 5 MG tablet Take 1 tablet (5 mg total) by mouth 3 (three) times daily as needed for muscle spasms. Patient not taking: Reported on 08/05/2017 04/22/17   Verner Mould, MD  hydrALAZINE (APRESOLINE) 10 MG tablet Take 2 tablets (20 mg total) by mouth every 8 (eight) hours. 08/08/17   Guadalupe Dawn, MD  insulin aspart (NOVOLOG) 100 UNIT/ML injection Inject 0-9 Units into the skin 3 (three) times daily with meals. cbg < 70 0 Units cbg 121-150 1 Units cbg 151-200 2 units cbg 201-250 3 Units cbg 251-300 5 Units cbg 301-350 7 Units cbg 351-400 9 Units 08/09/17   Guadalupe Dawn, MD  insulin aspart (NOVOLOG) 100 UNIT/ML injection Inject 0-5 Units into the skin at bedtime. cbg < 100 0 Units cbg 100-200 1 Units cbg 200-300 2 units cbg 3001-400 3 Units cbg 4001-500 5 Units 08/09/17   Guadalupe Dawn, MD  insulin degludec (TRESIBA) 100 UNIT/ML SOPN FlexTouch Pen Inject 0.25 mLs (25 Units total) into the skin daily at 10 pm for 14 days. 08/08/17 08/22/17  Guadalupe Dawn, MD   No Known Allergies  FAMILY HISTORY:  family history includes Diabetes in his brother, brother, mother, and sister; Heart disease (age of onset: 10)  in his mother. SOCIAL HISTORY:  reports that  has never smoked. he has never used smokeless tobacco. He reports that he does not drink alcohol or use drugs.  REVIEW OF SYSTEMS:   Unable to review as patient is lethargic   SUBJECTIVE:   VITAL SIGNS: Pulse Rate:  [16-10] 96 (01/19 2130) Resp:  [13-18] 13 (01/19 2130) BP: (126-178)/(85-107) 126/85 (01/19 2130) SpO2:  [93 %-99 %]  99 % (01/19 2130)  PHYSICAL EXAMINATION: General:  Obese Adult Male, no distress  Neuro:  Drowsy, follows commands, oriented, left arm/leg flaccid  HEENT:  Dry MM  Cardiovascular:  RRR, no MRG  Lungs:  Clear breath sounds, no wheeze  Abdomen:  Obese, active bowel sounds  Musculoskeletal:  -edema  Skin:  Warm, dry, intact   Recent Labs  Lab 08/07/17 0622 08/08/17 0412 08/09/17 2028 08/09/17 2042  NA 136 139 137 141  K 3.7 4.2 4.6 4.6  CL 105 106 107 107  CO2 20* 21* 18*  --   BUN 35* 40* 51* 44*  CREATININE 3.23* 3.50* 3.66* 3.70*  GLUCOSE 238* 214* 195* 194*   Recent Labs  Lab 08/04/17 2351 08/09/17 2028 08/09/17 2042  HGB 11.4* 12.3* 12.9*  HCT 32.9* 37.0* 38.0*  WBC 9.3 10.4  --   PLT 228 268  --    Ct Head Wo Contrast  Result Date: 08/09/2017 CLINICAL DATA:  Left-sided weakness. Altered level of consciousness. Seizure. EXAM: CT HEAD WITHOUT CONTRAST CT CERVICAL SPINE WITHOUT CONTRAST TECHNIQUE: Multidetector CT imaging of the head and cervical spine was performed following the standard protocol without intravenous contrast. Multiplanar CT image reconstructions of the cervical spine were also generated. COMPARISON:  08/05/2017 FINDINGS: CT HEAD FINDINGS Brain: No evidence of acute infarction, hemorrhage, hydrocephalus, extra-axial collection or mass lesion/mass effect. Vascular: No hyperdense vessel or unexpected calcification. Skull: Normal. Negative for fracture or focal lesion. Sinuses/Orbits: No acute finding. Other: None. CT CERVICAL SPINE FINDINGS Alignment: Normal. Skull base and vertebrae: No acute fracture. No primary bone lesion or focal pathologic process. Soft tissues and spinal canal: No prevertebral fluid or swelling. No visible canal hematoma. Disc levels:  Unremarkable Upper chest: Negative. Other: None IMPRESSION: 1. No acute intracranial abnormalities.  Normal brain 2. No evidence for cervical spine fracture or dislocation. Electronically Signed   By: Kerby Moors M.D.   On: 08/09/2017 20:50   Ct Cervical Spine Wo Contrast  Result Date: 08/09/2017 CLINICAL DATA:  Left-sided weakness. Altered level of consciousness. Seizure. EXAM: CT HEAD WITHOUT CONTRAST CT CERVICAL SPINE WITHOUT CONTRAST TECHNIQUE: Multidetector CT imaging of the head and cervical spine was performed following the standard protocol without intravenous contrast. Multiplanar CT image reconstructions of the cervical spine were also generated. COMPARISON:  08/05/2017 FINDINGS: CT HEAD FINDINGS Brain: No evidence of acute infarction, hemorrhage, hydrocephalus, extra-axial collection or mass lesion/mass effect. Vascular: No hyperdense vessel or unexpected calcification. Skull: Normal. Negative for fracture or focal lesion. Sinuses/Orbits: No acute finding. Other: None. CT CERVICAL SPINE FINDINGS Alignment: Normal. Skull base and vertebrae: No acute fracture. No primary bone lesion or focal pathologic process. Soft tissues and spinal canal: No prevertebral fluid or swelling. No visible canal hematoma. Disc levels:  Unremarkable Upper chest: Negative. Other: None IMPRESSION: 1. No acute intracranial abnormalities.  Normal brain 2. No evidence for cervical spine fracture or dislocation. Electronically Signed   By: Kerby Moors M.D.   On: 08/09/2017 20:50   Dg Chest Port 1 View  Result Date: 08/09/2017 CLINICAL DATA:  Seizures  EXAM: PORTABLE CHEST 1 VIEW COMPARISON:  August 04, 2017 FINDINGS: The study is limited due to low lung volumes. The heart appears enlarged but stable. The hila and mediastinum are unchanged. Mild atelectasis in the left base. No overt edema. No nodule or mass. No focal infiltrate. IMPRESSION: No active disease. Electronically Signed   By: Dorise Bullion III M.D   On: 08/09/2017 21:03    ASSESSMENT / PLAN:  Focal Seizure  Status Epilepticus > Resolved  Bell's Palsy  Chronic Kidney Disease Stage IV  HTN Diastolic/Systolic Heart Failure (EF 45-50)  Suspected OSA/OHS    -UDS negative, Glucose 194, BP 149/87 -Head CT negative  -Recently started on Tresiba  Plan  -Neurology Consulted > Multiple focal seizures with no prolonged postictal period, Status has resolved   -Loaded Fosphenytoin per Neurology, Ordered Keppra BID for maintenance  -Seizures Precautions  -EEG and MRI Brain  -Recommend sleep study and CPAP at HS   Patient is Hemodynamically stable and since has had no further seizures since Fosphenytoin load > Stable for transfer to Step-down with Neurology following, please re-consult if needed   Hayden Pedro, AGACNP-BC [place]  Pgr: [phone]  PCCM Pgr: [phone]

## 2017-08-09 NOTE — ED Notes (Signed)
Admitting MD at bedside.

## 2017-08-09 NOTE — ED Triage Notes (Signed)
Pt presents from home with [facility] for seizures ongoing since 2pm lasting about 30 seconds each per family on scene; EMS states starts in R arm and then has L gaze and then goes into tonic clonic seizure activity; 1 epsiode witnessed by fire first responders, another witnesse by EMS on scene; pt noted to be tachycardic during these episodes; family also noted hx bells palsy and CBG elevated this week and began new antidiabetic medication (tresiba); family also notes new slurred speech and L sided extremity weakness; LSW before 2pm today

## 2017-08-09 NOTE — ED Provider Notes (Addendum)
[facility] EMERGENCY DEPARTMENT Provider Note   CSN: [id number] Arrival date & time: 08/09/17  1954     History   Chief Complaint Chief Complaint  Patient presents with  . Seizures    HPI Jeremy [name] is a 52 y.o. male.  Patient with history of Bell's palsy, CTD, high blood pressure, possible seizures, hyperosmolar syndrome, per report new medication tresiba presents after a witnessed seizures all lasting approximately 30 seconds to 1 minute. No history of significant seizures similar to this. Unknown details of head injury. Patient in between seizure episodes does not return to baseline and does have left arm weakness and gaze to the right. Last known normal 2:00.      Past Medical History:  Diagnosis Date  . Abscess   . AKI (acute kidney injury) ([place]) 08/05/2017  . Bell's palsy   . CKD (chronic kidney disease)    Dr. McDiarmind   . High cholesterol   . Hypertension   . Left shoulder pain 10/26/2013   S/p injection on 10/26/13   . Renal insufficiency 02/14/2014  . Seizures ([place]) 08/04/2017   "related to high blood sugars" (08/05/2017)  . Type II diabetes mellitus [facility])     Patient Active Problem List   Diagnosis Date Noted  . Hyperosmolar syndrome 08/06/2017  . Diabetes mellitus type 2 in obese ([place])   . Hyperosmolar non-ketotic state in patient with type 2 diabetes mellitus ([place]) 08/05/2017  . Hyperglycemia 08/05/2017  . Acute kidney injury (nontraumatic) ([place])   . Seizure ([place])   . Back pain 04/22/2017  . Bilateral lower extremity edema 04/08/2017  . Diabetes mellitus type 2, uncontrolled ([place]) 03/03/2017  . Chronic kidney disease (CKD), stage IV (severe) ([place]) 02/14/2014  . Rupture of right biceps tendon 12/07/2013  . History of Bell's palsy with residual right eye ptosis 06/08/2013  . Morbid obesity ([place]) 05/20/2013  . Erectile dysfunction associated with type 2 diabetes mellitus ([place]) 06/02/2012  . Macular edema 12/12/2011  . Primary  hypertension 11/15/2011  . Mixed hyperlipidemia due to type 2 diabetes mellitus ([place]) 11/11/2011    Past Surgical History:  Procedure Laterality Date  . I&D EXTREMITY  11/11/2011   Procedure: IRRIGATION AND DEBRIDEMENT EXTREMITY;  Surgeon: Merrie Roof, MD;  Location: [place];  Service: General;  Laterality: Right;  I&D Right Thigh Abscess       Home Medications    Prior to Admission medications   Medication Sig Start Date End Date Taking? Authorizing Provider  amLODipine (NORVASC) 10 MG tablet Take 1 tablet (10 mg total) by mouth daily. 01/31/17   Leslie Bing, DO  atorvastatin (LIPITOR) 40 MG tablet Take 1 tablet (40 mg total) by mouth daily. 03/03/17   Welaka Bing, DO  carvedilol (COREG CR) 20 MG 24 hr capsule Take 2 capsules (40 mg total) by mouth daily. 05/02/17   [place] Bing, DO  cyclobenzaprine (FLEXERIL) 5 MG tablet Take 1 tablet (5 mg total) by mouth 3 (three) times daily as needed for muscle spasms. Patient not taking: Reported on 08/05/2017 04/22/17   Verner Mould, MD  hydrALAZINE (APRESOLINE) 10 MG tablet Take 2 tablets (20 mg total) by mouth every 8 (eight) hours. 08/08/17   Guadalupe Dawn, MD  insulin aspart (NOVOLOG) 100 UNIT/ML injection Inject 0-9 Units into the skin 3 (three) times daily with meals. cbg < 70 0 Units cbg 121-150 1 Units cbg 151-200 2 units cbg 201-250 3 Units cbg 251-300 5 Units cbg 301-350  7 Units cbg 351-400 9 Units 08/09/17   Guadalupe Dawn, MD  insulin aspart (NOVOLOG) 100 UNIT/ML injection Inject 0-5 Units into the skin at bedtime. cbg < 100 0 Units cbg 100-200 1 Units cbg 200-300 2 units cbg 3001-400 3 Units cbg 4001-500 5 Units 08/09/17   Guadalupe Dawn, MD  insulin degludec (TRESIBA) 100 UNIT/ML SOPN FlexTouch Pen Inject 0.25 mLs (25 Units total) into the skin daily at 10 pm for 14 days. 08/08/17 08/22/17  Guadalupe Dawn, MD    Family History Family History  Problem Relation Age of Onset  . Diabetes Mother   . Heart  disease Mother 59  . Diabetes Sister   . Diabetes Brother   . Diabetes Brother   . Stroke Neg Hx   . Cancer Neg Hx     Social History Social History   Tobacco Use  . Smoking status: Never Smoker  . Smokeless tobacco: Never Used  Substance Use Topics  . Alcohol use: No    Alcohol/week: 0.0 oz  . Drug use: No     Allergies   Patient has no known allergies.   Review of Systems Review of Systems  Unable to perform ROS: Patient nonverbal  Endocrine: Negative for polydipsia.     Physical Exam Updated Vital Signs BP 126/85   Pulse 67   Resp 13   SpO2 99%   Physical Exam   ED Treatments / Results  Labs (all labs ordered are listed, but only abnormal results are displayed) Labs Reviewed  CBC WITH DIFFERENTIAL/PLATELET - Abnormal; Notable for the following components:      Result Value   Hemoglobin 12.3 (*)    HCT 37.0 (*)    MCV 77.2 (*)    MCH 25.7 (*)    Neutro Abs 8.9 (*)    All other components within normal limits  COMPREHENSIVE METABOLIC PANEL - Abnormal; Notable for the following components:   CO2 18 (*)    Glucose, Bld 195 (*)    BUN 51 (*)    Creatinine, Ser 3.66 (*)    Albumin 3.3 (*)    AST 43 (*)    GFR calc non Af Amer 18 (*)    GFR calc Af Amer 21 (*)    All other components within normal limits  I-STAT CHEM 8, ED - Abnormal; Notable for the following components:   BUN 44 (*)    Creatinine, Ser 3.70 (*)    Glucose, Bld 194 (*)    Hemoglobin 12.9 (*)    HCT 38.0 (*)    All other components within normal limits  I-STAT VENOUS BLOOD GAS, ED - Abnormal; Notable for the following components:   pCO2, Ven 35.1 (*)    Acid-base deficit 3.0 (*)    All other components within normal limits  PROTIME-INR  RAPID URINE DRUG SCREEN, HOSP PERFORMED  URINALYSIS, ROUTINE W REFLEX MICROSCOPIC  I-STAT TROPONIN, ED  I-STAT CG4 LACTIC ACID, ED    EKG  EKG Interpretation  Date/Time:  Saturday August 09 2017 20:25:49 EST Ventricular Rate:  85 PR  Interval:    QRS Duration: 95 QT Interval:  374 QTC Calculation: 445 R Axis:   43 Text Interpretation:  Sinus rhythm Repol abnrm, global ischemia, diffuse leads Borderline ST elevation, lateral leads Confirmed by Elnora Morrison [phone]) on 08/09/2017 9:03:50 PM       Radiology Ct Head Wo Contrast  Result Date: 08/09/2017 CLINICAL DATA:  Left-sided weakness. Altered level of consciousness. Seizure. EXAM: CT HEAD WITHOUT  CONTRAST CT CERVICAL SPINE WITHOUT CONTRAST TECHNIQUE: Multidetector CT imaging of the head and cervical spine was performed following the standard protocol without intravenous contrast. Multiplanar CT image reconstructions of the cervical spine were also generated. COMPARISON:  08/05/2017 FINDINGS: CT HEAD FINDINGS Brain: No evidence of acute infarction, hemorrhage, hydrocephalus, extra-axial collection or mass lesion/mass effect. Vascular: No hyperdense vessel or unexpected calcification. Skull: Normal. Negative for fracture or focal lesion. Sinuses/Orbits: No acute finding. Other: None. CT CERVICAL SPINE FINDINGS Alignment: Normal. Skull base and vertebrae: No acute fracture. No primary bone lesion or focal pathologic process. Soft tissues and spinal canal: No prevertebral fluid or swelling. No visible canal hematoma. Disc levels:  Unremarkable Upper chest: Negative. Other: None IMPRESSION: 1. No acute intracranial abnormalities.  Normal brain 2. No evidence for cervical spine fracture or dislocation. Electronically Signed   By: Kerby Moors M.D.   On: 08/09/2017 20:50   Ct Cervical Spine Wo Contrast  Result Date: 08/09/2017 CLINICAL DATA:  Left-sided weakness. Altered level of consciousness. Seizure. EXAM: CT HEAD WITHOUT CONTRAST CT CERVICAL SPINE WITHOUT CONTRAST TECHNIQUE: Multidetector CT imaging of the head and cervical spine was performed following the standard protocol without intravenous contrast. Multiplanar CT image reconstructions of the cervical spine were also  generated. COMPARISON:  08/05/2017 FINDINGS: CT HEAD FINDINGS Brain: No evidence of acute infarction, hemorrhage, hydrocephalus, extra-axial collection or mass lesion/mass effect. Vascular: No hyperdense vessel or unexpected calcification. Skull: Normal. Negative for fracture or focal lesion. Sinuses/Orbits: No acute finding. Other: None. CT CERVICAL SPINE FINDINGS Alignment: Normal. Skull base and vertebrae: No acute fracture. No primary bone lesion or focal pathologic process. Soft tissues and spinal canal: No prevertebral fluid or swelling. No visible canal hematoma. Disc levels:  Unremarkable Upper chest: Negative. Other: None IMPRESSION: 1. No acute intracranial abnormalities.  Normal brain 2. No evidence for cervical spine fracture or dislocation. Electronically Signed   By: Kerby Moors M.D.   On: 08/09/2017 20:50   Dg Chest Port 1 View  Result Date: 08/09/2017 CLINICAL DATA:  Seizures EXAM: PORTABLE CHEST 1 VIEW COMPARISON:  August 04, 2017 FINDINGS: The study is limited due to low lung volumes. The heart appears enlarged but stable. The hila and mediastinum are unchanged. Mild atelectasis in the left base. No overt edema. No nodule or mass. No focal infiltrate. IMPRESSION: No active disease. Electronically Signed   By: Dorise Bullion III M.D   On: 08/09/2017 21:03    Procedures .Critical Care Performed by: Elnora Morrison, MD Authorized by: Elnora Morrison, MD   Critical care provider statement:    Critical care time (minutes):  40   Critical care start time:  08/09/2017 8:15 PM   Critical care end time:  08/09/2017 8:45 PM   Critical care time was exclusive of:  Separately billable procedures and treating other patients and teaching time   Critical care was necessary to treat or prevent imminent or life-threatening deterioration of the following conditions:  CNS failure or compromise   Critical care was time spent personally by me on the following activities:  Ordering and review of  laboratory studies, ordering and review of radiographic studies and examination of patient   I assumed direction of critical care for this patient from another provider in my specialty: no     (including critical care time)  Medications Ordered in ED Medications  0.9 %  sodium chloride infusion ( Intravenous New Bag/Given 08/09/17 2108)  LORazepam (ATIVAN) 2 MG/ML injection (2 mg  Given 08/09/17 1958)  fosPHENYtoin (  CEREBYX) 2,000 mg PE in sodium chloride 0.9 % 50 mL IVPB (0 mg PE Intravenous Stopped 08/09/17 2130)  LORazepam (ATIVAN) 2 MG/ML injection (2 mg  Given 08/09/17 2038)     Initial Impression / Assessment and Plan / ED Course  I have reviewed the triage vital signs and the nursing notes.  Pertinent labs & imaging results that were available during my care of the patient were reviewed by me and considered in my medical decision making (see chart for details).    Patient presents with clinical concern for status epilepticus. Patient had 8 seizures prior to arrival and had 2 witnessed seizures in the ER. Ativan given to help resolve in both cases 2 mg. Keppra ordered and changed to fosphenytoin based on neurology recommendations. Neurology myself inside the patient the bedside. Patient does have left arm weakness and right gaze intermittently likely Todd's paralysis however patient will need further workup in the hospital. Symptoms started at approximately 2:00. Patient has had seizure-like activity last admission was told likely not seizures and due to metabolic issues.  Updated family on admission and patient will need critical care for close monitoring.  Patient improved in the ER and no further seizure activity since fosphenytoin. Critical care evaluation and recommended stepdown admission to family practice. Family medicine resident paged for admission. The patients results and plan were reviewed and discussed.   Any x-rays performed were independently reviewed by myself.    Differential diagnosis were considered with the presenting HPI.  Medications  0.9 %  sodium chloride infusion ( Intravenous New Bag/Given 08/09/17 2108)  LORazepam (ATIVAN) 2 MG/ML injection (2 mg  Given 08/09/17 1958)  fosPHENYtoin (CEREBYX) 2,000 mg PE in sodium chloride 0.9 % 50 mL IVPB (0 mg PE Intravenous Stopped 08/09/17 2130)  LORazepam (ATIVAN) 2 MG/ML injection (2 mg  Given 08/09/17 2038)    Vitals:   08/09/17 1954 08/09/17 2030 08/09/17 2102 08/09/17 2130  BP:  (!) 143/85 (!) 178/107 126/85  Pulse:  89 84 67  Resp:  15 18 13   SpO2: 96% 93% 99% 99%    Final diagnoses:  Status epilepticus ([place])    Admission/ observation were discussed with the admitting physician, patient and/or family and they are comfortable with the plan.    Final Clinical Impressions(s) / ED Diagnoses   Final diagnoses:  Status epilepticus [facility])    ED Discharge Orders    None       Elnora Morrison, MD 08/09/17 2150    Elnora Morrison, MD 08/09/17 2247

## 2017-08-09 NOTE — H&P (Signed)
[facility] Admission History and Physical Service Pager: [phone]  Patient name: Jeremy [name] Medical record number: [id number] Date of birth: Oct 21, 1965 Age: 52 y.o. Gender: male  Primary Care Provider: [place] Bing, DO Consultants: Neurology Code Status: Full  Chief Complaint: seizure  Assessment and Plan: Jeremy [name] is a 52 y.o. male presenting with persistent seizures. PMH is significant for T2DM, CKD IV, Bell's Palsy of R eye, HLD, Obesity, HTN.  Seizure Patient recently admitted for seizure, presumed to be related to hyperglycemia, however resumed seizing this morning despite correction of hyperglycemia. EEG last admission (1/16) without abnormalities. In ED, received Ativan x2 and loaded with fosphenytoin. No history of head trauma. CT Head, C spine, CXR negative for acute process. Patient's glucose on admission 195 so hyperglycemia unlikely to be causal etiology. Lactic acid wnl. VBG, CBC unremarkable. Family denies sick symptoms and with no fever or leukocytosis unlikely infectious source. UDS negative. Neurology consulted who recommended Keppra, obtaining EEG and MRI brain.  - admit to stepdown, attending Dr. McDiarmid - vitals per floor routine - neurology consulted, appreciate recs - MRI brain - EEG in the am - Keppra 1g BID - Ativan 1mg  prn seizure >5 min - am BMP, CBC - bedside swallow screen - neuro checks - fall, seizure precautions - supplemental O2 as needed to maintain sats >92%  Type 2 Diabetes with hyperglycemia. Not in DKA or HHS Recently admitted in hyperosmolar non-ketotic state. Discharged on [place]. Patient has had one day of Tyler Aas but has not picked up the Novolog from the pharmacy yet. A1c >15.5 07/2017, CBG on admission 194. - Lantus 20u - rSSI - CBG monitoring - Consult of diabetic coordinator for diabetic teaching.  - Ensure he gets his meds on discharge.  AKI on CKD IV Patient's baseline cr  appears to be ~2.8-3.0, Cr 3.70 at admission. Likely pre-renal due to dehydration as girlfriend states he has not eaten much in the last day. Nephrology consulted last admission as dialysis has been discussed in the past but felt dialysis was not indicated. They recommended holding patient's lisinopril and furosemide until kidney function stabilized. Was to follow up outpatient and likely restart medications then. - holding lisinopril and furosemide - Daily BMET   HLD Cholesterol 171 in 2017. Continue atorvastatin 40mg  daily once able to take PO.  HTN Hypertensive on admission 143/69. Will hold home meds while NPO, can restart once able to take PO. Takes amlodipine, coreg CR, lisinopril and furosemide at home. Switch Coreg CR to immediate release. CR is very expensive. He hasn't been taking due to cost  - Added PRN hydralazine for sbp > 180.  FEN/GI: NPO until passes bedside swallow  Prophylaxis: lovenox  Disposition: admit to stepdown, attending Dr. McDiarmid  History of Present Illness:  Jeremy [name] is a 52 y.o. male presenting with persistent seizures. Was recently discharged 1/18 for seizure activity, presumed due to hyperglycemia. Started on Tresiba. Family states he had two seizures the day of discharge and did not seize again until this morning at about 7am. Seizures last less than 5 minutes, about 30 seconds each. This afternoon seizures occurred every 10 minutes between 2 pm and the time he came in. Girlfriend states these seizures are similar to his seizures when he was hospitalized. Describes them as starting in his R arm with L gaze deviation progressing into "tight and shaking at the same time" with some urinary incontinence and postictal state. Girlfriend noticed some slurred speech this time with  seizures. Patient's father has history of seizures. Three seizures witnessed by EMS, 2 witnessed by ED providers. In ED, received Ativan x2 and fosphenytoin.  Review Of Systems: Per HPI  with the following additions:   Review of Systems  Constitutional: Positive for diaphoresis. Negative for chills and fever.  Respiratory: Negative for shortness of breath.   Cardiovascular: Negative for chest pain.  Gastrointestinal: Negative for abdominal pain.  Genitourinary: Negative for dysuria.  Neurological: Positive for dizziness and speech change.    Patient Active Problem List   Diagnosis Date Noted  . Hyperosmolar syndrome 08/06/2017  . Diabetes mellitus type 2 in obese ([place])   . Hyperosmolar non-ketotic state in patient with type 2 diabetes mellitus ([place]) 08/05/2017  . Hyperglycemia 08/05/2017  . Acute kidney injury (nontraumatic) ([place])   . Seizure ([place])   . Back pain 04/22/2017  . Bilateral lower extremity edema 04/08/2017  . Diabetes mellitus type 2, uncontrolled ([place]) 03/03/2017  . Chronic kidney disease (CKD), stage IV (severe) ([place]) 02/14/2014  . Rupture of right biceps tendon 12/07/2013  . History of Bell's palsy with residual right eye ptosis 06/08/2013  . Morbid obesity ([place]) 05/20/2013  . Erectile dysfunction associated with type 2 diabetes mellitus ([place]) 06/02/2012  . Macular edema 12/12/2011  . Primary hypertension 11/15/2011  . Mixed hyperlipidemia due to type 2 diabetes mellitus ([place]) 11/11/2011    Past Medical History: Past Medical History:  Diagnosis Date  . Abscess   . AKI (acute kidney injury) ([place]) 08/05/2017  . Bell's palsy   . CKD (chronic kidney disease)    Dr. McDiarmind   . High cholesterol   . Hypertension   . Left shoulder pain 10/26/2013   S/p injection on 10/26/13   . Renal insufficiency 02/14/2014  . Seizures ([place]) 08/04/2017   "related to high blood sugars" (08/05/2017)  . Type II diabetes mellitus ([place])     Past Surgical History: Past Surgical History:  Procedure Laterality Date  . I&D EXTREMITY  11/11/2011   Procedure: IRRIGATION AND DEBRIDEMENT EXTREMITY;  Surgeon: Merrie Roof, MD;  Location: [facility];  Service: General;   Laterality: Right;  I&D Right Thigh Abscess    Social History: Social History   Tobacco Use  . Smoking status: Never Smoker  . Smokeless tobacco: Never Used  Substance Use Topics  . Alcohol use: No    Alcohol/week: 0.0 oz  . Drug use: No   Additional social history: Lives with girlfriend, two children who do not live close by. Denies alcohol, tobacco, drug use. Please also refer to relevant sections of EMR.  Family History: Family History  Problem Relation Age of Onset  . Diabetes Mother   . Heart disease Mother 38  . Diabetes Sister   . Diabetes Brother   . Diabetes Brother   . Stroke Neg Hx   . Cancer Neg Hx    Father had h/o seizures per sister.  Allergies and Medications: No Known Allergies No current facility-administered medications on file prior to encounter.    Current Outpatient Medications on File Prior to Encounter  Medication Sig Dispense Refill  . amLODipine (NORVASC) 10 MG tablet Take 1 tablet (10 mg total) by mouth daily. 90 tablet 3  . atorvastatin (LIPITOR) 40 MG tablet Take 1 tablet (40 mg total) by mouth daily. 90 tablet 3  . carvedilol (COREG CR) 20 MG 24 hr capsule Take 2 capsules (40 mg total) by mouth daily. 90 capsule 3  . cyclobenzaprine (FLEXERIL) 5 MG tablet Take  1 tablet (5 mg total) by mouth 3 (three) times daily as needed for muscle spasms. (Patient not taking: Reported on 08/05/2017) 15 tablet 0  . hydrALAZINE (APRESOLINE) 10 MG tablet Take 2 tablets (20 mg total) by mouth every 8 (eight) hours. 30 tablet 0  . insulin aspart (NOVOLOG) 100 UNIT/ML injection Inject 0-9 Units into the skin 3 (three) times daily with meals. cbg < 70 0 Units cbg 121-150 1 Units cbg 151-200 2 units cbg 201-250 3 Units cbg 251-300 5 Units cbg 301-350 7 Units cbg 351-400 9 Units 10 mL 11  . insulin aspart (NOVOLOG) 100 UNIT/ML injection Inject 0-5 Units into the skin at bedtime. cbg < 100 0 Units cbg 100-200 1 Units cbg 200-300 2 units cbg 3001-400 3 Units cbg 4001-500  5 Units 10 mL 11  . insulin degludec (TRESIBA) 100 UNIT/ML SOPN FlexTouch Pen Inject 0.25 mLs (25 Units total) into the skin daily at 10 pm for 14 days. 3.5 mL 0    Objective: BP 126/85   Pulse 67   Resp 13   SpO2 99%  Exam: General: somnolent obese male in NAD Eyes: PERRL, EOMI, anicteric sclerae ENTM: MMM, poor dentition. No trauma to tongue or mucosa. Cardiovascular: distant heart sounds. No murmurs appreciated. Respiratory: CTAB, end-expiratory upper airway sounds transmitted Gastrointestinal: firm, distended. Non tender to palpation. Hypoactive bowel sounds Derm: no rashes or lesions, warm and well perfused MSK: ROM grossly intact, strength 5/5 to R U/LE 4/5 L U/LE. 5/5 grip strength.  Neuro: Intermittently alert and oriented x2, speech slurred. PERRL, Extraocular movements intact. Hearing grossly intact bilaterally. Tongue protrudes normally with no deviation. Smile symmetric. No pronator drift.  Labs and Imaging: CBC BMET  Recent Labs  Lab 08/09/17 2028 08/09/17 2042  WBC 10.4  --   HGB 12.3* 12.9*  HCT 37.0* 38.0*  PLT 268  --    Recent Labs  Lab 08/09/17 2028 08/09/17 2042  NA 137 141  K 4.6 4.6  CL 107 107  CO2 18*  --   BUN 51* 44*  CREATININE 3.66* 3.70*  GLUCOSE 195* 194*  CALCIUM 9.2  --      Ct Head Wo Contrast  Result Date: 08/09/2017 CLINICAL DATA:  Left-sided weakness. Altered level of consciousness. Seizure. EXAM: CT HEAD WITHOUT CONTRAST CT CERVICAL SPINE WITHOUT CONTRAST TECHNIQUE: Multidetector CT imaging of the head and cervical spine was performed following the standard protocol without intravenous contrast. Multiplanar CT image reconstructions of the cervical spine were also generated. COMPARISON:  08/05/2017 FINDINGS: CT HEAD FINDINGS Brain: No evidence of acute infarction, hemorrhage, hydrocephalus, extra-axial collection or mass lesion/mass effect. Vascular: No hyperdense vessel or unexpected calcification. Skull: Normal. Negative for fracture  or focal lesion. Sinuses/Orbits: No acute finding. Other: None. CT CERVICAL SPINE FINDINGS Alignment: Normal. Skull base and vertebrae: No acute fracture. No primary bone lesion or focal pathologic process. Soft tissues and spinal canal: No prevertebral fluid or swelling. No visible canal hematoma. Disc levels:  Unremarkable Upper chest: Negative. Other: None IMPRESSION: 1. No acute intracranial abnormalities.  Normal brain 2. No evidence for cervical spine fracture or dislocation. Electronically Signed   By: Kerby Moors M.D.   On: 08/09/2017 20:50   Ct Cervical Spine Wo Contrast  Result Date: 08/09/2017 CLINICAL DATA:  Left-sided weakness. Altered level of consciousness. Seizure. EXAM: CT HEAD WITHOUT CONTRAST CT CERVICAL SPINE WITHOUT CONTRAST TECHNIQUE: Multidetector CT imaging of the head and cervical spine was performed following the standard protocol without intravenous contrast. Multiplanar CT  image reconstructions of the cervical spine were also generated. COMPARISON:  08/05/2017 FINDINGS: CT HEAD FINDINGS Brain: No evidence of acute infarction, hemorrhage, hydrocephalus, extra-axial collection or mass lesion/mass effect. Vascular: No hyperdense vessel or unexpected calcification. Skull: Normal. Negative for fracture or focal lesion. Sinuses/Orbits: No acute finding. Other: None. CT CERVICAL SPINE FINDINGS Alignment: Normal. Skull base and vertebrae: No acute fracture. No primary bone lesion or focal pathologic process. Soft tissues and spinal canal: No prevertebral fluid or swelling. No visible canal hematoma. Disc levels:  Unremarkable Upper chest: Negative. Other: None IMPRESSION: 1. No acute intracranial abnormalities.  Normal brain 2. No evidence for cervical spine fracture or dislocation. Electronically Signed   By: Kerby Moors M.D.   On: 08/09/2017 20:50   Dg Chest Port 1 View  Result Date: 08/09/2017 CLINICAL DATA:  Seizures EXAM: PORTABLE CHEST 1 VIEW COMPARISON:  August 04, 2017  FINDINGS: The study is limited due to low lung volumes. The heart appears enlarged but stable. The hila and mediastinum are unchanged. Mild atelectasis in the left base. No overt edema. No nodule or mass. No focal infiltrate. IMPRESSION: No active disease. Electronically Signed   By: Dorise Bullion III M.D   On: 08/09/2017 21:03   Rory Percy, DO 08/09/2017, 10:59 PM PGY-1, [place] Intern pager: [phone], text pages welcome  I have seen and evaluated the patient with Dr. Ky Barban. I am in agreement with the note above in its revised form. My additions are in red.  Wendee Beavers, MD, PGY-2 08/10/2017 7:03 AM

## 2017-08-10 ENCOUNTER — Inpatient Hospital Stay (HOSPITAL_COMMUNITY): Payer: 59

## 2017-08-10 ENCOUNTER — Encounter (HOSPITAL_COMMUNITY): Payer: Self-pay | Admitting: Radiology

## 2017-08-10 ENCOUNTER — Other Ambulatory Visit: Payer: Self-pay

## 2017-08-10 DIAGNOSIS — D352 Benign neoplasm of pituitary gland: Secondary | ICD-10-CM | POA: Diagnosis present

## 2017-08-10 DIAGNOSIS — G40901 Epilepsy, unspecified, not intractable, with status epilepticus: Secondary | ICD-10-CM | POA: Insufficient documentation

## 2017-08-10 DIAGNOSIS — R410 Disorientation, unspecified: Secondary | ICD-10-CM | POA: Diagnosis not present

## 2017-08-10 DIAGNOSIS — D497 Neoplasm of unspecified behavior of endocrine glands and other parts of nervous system: Secondary | ICD-10-CM | POA: Diagnosis present

## 2017-08-10 DIAGNOSIS — K029 Dental caries, unspecified: Secondary | ICD-10-CM | POA: Diagnosis present

## 2017-08-10 LAB — CBG MONITORING, ED
GLUCOSE-CAPILLARY: 100 mg/dL — AB (ref 65–99)
GLUCOSE-CAPILLARY: 105 mg/dL — AB (ref 65–99)
Glucose-Capillary: 160 mg/dL — ABNORMAL HIGH (ref 65–99)
Glucose-Capillary: 190 mg/dL — ABNORMAL HIGH (ref 65–99)

## 2017-08-10 LAB — CREATININE, SERUM
Creatinine, Ser: 3.37 mg/dL — ABNORMAL HIGH (ref 0.61–1.24)
GFR calc non Af Amer: 20 mL/min — ABNORMAL LOW (ref >60)
GFR, EST AFRICAN AMERICAN: 23 mL/min — AB (ref >60)

## 2017-08-10 LAB — CBC
HEMATOCRIT: 35 % — AB (ref 39.0–52.0)
HEMATOCRIT: 33.8 % — AB (ref 39.0–52.0)
HEMOGLOBIN: 11.1 g/dL — AB (ref 13.0–17.0)
Hemoglobin: 11.7 g/dL — ABNORMAL LOW (ref 13.0–17.0)
MCH: 25.5 pg — AB (ref 26.0–34.0)
MCH: 25.4 pg — ABNORMAL LOW (ref 26.0–34.0)
MCHC: 33.4 g/dL (ref 30.0–36.0)
MCHC: 32.8 g/dL (ref 30.0–36.0)
MCV: 76.4 fL — ABNORMAL LOW (ref 78.0–100.0)
MCV: 77.3 fL — AB (ref 78.0–100.0)
PLATELETS: 248 10*3/uL (ref 150–400)
Platelets: 270 10*3/uL (ref 150–400)
RBC: 4.58 MIL/uL (ref 4.22–5.81)
RBC: 4.37 MIL/uL (ref 4.22–5.81)
RDW: 13.4 % (ref 11.5–15.5)
RDW: 14 % (ref 11.5–15.5)
WBC: 9.7 10*3/uL (ref 4.0–10.5)
WBC: 10.1 10*3/uL (ref 4.0–10.5)

## 2017-08-10 LAB — GLUCOSE, CAPILLARY: GLUCOSE-CAPILLARY: 141 mg/dL — AB (ref 65–99)

## 2017-08-10 LAB — BASIC METABOLIC PANEL
Anion gap: 12 (ref 5–15)
BUN: 49 mg/dL — AB (ref 6–20)
CHLORIDE: 109 mmol/L (ref 101–111)
CO2: 19 mmol/L — ABNORMAL LOW (ref 22–32)
Calcium: 8.9 mg/dL (ref 8.9–10.3)
Creatinine, Ser: 3.26 mg/dL — ABNORMAL HIGH (ref 0.61–1.24)
GFR, EST AFRICAN AMERICAN: 24 mL/min — AB (ref >60)
GFR, EST NON AFRICAN AMERICAN: 21 mL/min — AB (ref >60)
Glucose, Bld: 137 mg/dL — ABNORMAL HIGH (ref 65–99)
POTASSIUM: 4.3 mmol/L (ref 3.5–5.1)
SODIUM: 140 mmol/L (ref 135–145)

## 2017-08-10 MED ORDER — INSULIN ASPART 100 UNIT/ML ~~LOC~~ SOLN: 0.0000 [IU] | Freq: Every day | SUBCUTANEOUS | Status: DC

## 2017-08-10 MED ORDER — HYDRALAZINE HCL 20 MG/ML IJ SOLN: 5.0000 mg | Freq: Four times a day (QID) | INTRAMUSCULAR | Status: DC | PRN

## 2017-08-10 MED ORDER — PNEUMOCOCCAL VAC POLYVALENT 25 MCG/0.5ML IJ INJ
0.5000 mL | INJECTION | INTRAMUSCULAR | Status: AC
  Administered 2017-08-11: 0.5 mL via INTRAMUSCULAR
  Filled 2017-08-10: qty 0.5

## 2017-08-10 MED ORDER — HYDRALAZINE HCL 10 MG PO TABS
20.0000 mg | ORAL_TABLET | Freq: Three times a day (TID) | ORAL | Status: DC
  Administered 2017-08-10 – 2017-08-11 (×4): 20 mg via ORAL
  Filled 2017-08-10 (×5): qty 2

## 2017-08-10 MED ORDER — ENOXAPARIN SODIUM 40 MG/0.4ML ~~LOC~~ SOLN
40.0000 mg | Freq: Every day | SUBCUTANEOUS | Status: DC
  Administered 2017-08-11: 40 mg via SUBCUTANEOUS
  Filled 2017-08-10 (×2): qty 0.4

## 2017-08-10 MED ORDER — AMLODIPINE BESYLATE 10 MG PO TABS
10.0000 mg | ORAL_TABLET | Freq: Every day | ORAL | Status: DC
  Administered 2017-08-10 – 2017-08-11 (×2): 10 mg via ORAL
  Filled 2017-08-10: qty 2
  Filled 2017-08-10: qty 1

## 2017-08-10 MED ORDER — CARVEDILOL PHOSPHATE ER 40 MG PO CP24
40.0000 mg | ORAL_CAPSULE | Freq: Every day | ORAL | Status: DC
  Administered 2017-08-10 – 2017-08-11 (×2): 40 mg via ORAL
  Filled 2017-08-10 (×2): qty 1

## 2017-08-10 MED ORDER — HYDRALAZINE HCL 20 MG/ML IJ SOLN: 10.0000 mg | Freq: Four times a day (QID) | INTRAMUSCULAR | Status: DC | PRN

## 2017-08-10 MED ORDER — INSULIN GLARGINE 100 UNIT/ML ~~LOC~~ SOLN
20.0000 [IU] | Freq: Every day | SUBCUTANEOUS | Status: DC
  Administered 2017-08-10: 20 [IU] via SUBCUTANEOUS
  Filled 2017-08-10 (×3): qty 0.2

## 2017-08-10 MED ORDER — LORAZEPAM 2 MG/ML IJ SOLN: 1.0000 mg | Freq: Once | INTRAMUSCULAR | Status: DC | PRN

## 2017-08-10 MED ORDER — INSULIN ASPART 100 UNIT/ML ~~LOC~~ SOLN
0.0000 [IU] | Freq: Three times a day (TID) | SUBCUTANEOUS | Status: DC
  Administered 2017-08-10: 4 [IU] via SUBCUTANEOUS
  Administered 2017-08-11: 7 [IU] via SUBCUTANEOUS
  Filled 2017-08-10: qty 1

## 2017-08-10 MED ORDER — ATORVASTATIN CALCIUM 40 MG PO TABS
40.0000 mg | ORAL_TABLET | Freq: Every day | ORAL | Status: DC
  Administered 2017-08-10 – 2017-08-11 (×2): 40 mg via ORAL
  Filled 2017-08-10 (×2): qty 1

## 2017-08-10 NOTE — Progress Notes (Signed)
Family Medicine Teaching Service Daily Progress Note Intern Pager: [phone]  Patient name: Jeremy [name] Medical record number: [id number] Date of birth: [birth year] Age: 52 y.o. Gender: male  Primary Care Provider: [place] Bing, DO Consultants: neurology Code Status: full code  Pt Overview and Major Events to Date:  -1/19 admitted for seizure-like activity  Assessment and Plan: Jeremy [name] is a 52 y.o. male presenting with persistent seizures. PMH is significant for T2DM, CKD IV, Bell's Palsy of R eye, HLD, Obesity, HTN.  Seizure: Unclear etiology. Workup so far not revealing anything.  He had no further seizure since loaded with fosphenytoin and Keppra.  -Appreciate neurology recommendations   -EEG, MRI brain, Keppra 1 g twice daily - Vitals per floor routine - Ativan 1mg  prn seizure >5 min - am BMP, CBC - Bedside swallow screen - neuro checks - fall, seizure precautions - supplemental O2 as needed to maintain sats >92%  Type 2 Diabetes with hyperglycemia. Not in DKA or HHS.  A1c >15.5 07/2017, CBG on admission 194. - Lantus 20u - rSSI - CBG monitoring - Consult of diabetic coordinator for diabetic teaching.  - Ensure he gets his meds on discharge.  AKI on CKD IV: Improved.  Serum creatinine 3.26.  Baseline 2.8-3.0. - Holding nephrotoxic meds - Continue IV fluid.  Reduced to 75 cc due to BP - Daily BMET  HLD Cholesterol 171 in 2017. Continue atorvastatin 40mg  daily once able to take PO.  HTN: Hypertensive to 170s/80s this morning.  -Reduce IV fluid to 75 cc/h. -Resume home meds once he passes bedside swallow screen -PRN hydralazine 10 mg for SBP greater than 180  OSA: Noted to have intermittent desaturation to mid 80s and apnea overnight. He score 7 on STOP BANG.  -Oxygen as needed -CPAP nightly -We will order home CPAP.  He need outpatient sleep study  Financial issues: Patient has not been able to afford most of his medications. -Case management  consulted   FEN/GI: NPO until passes bedside swallow  Prophylaxis: lovenox  Disposition:  Continue inpatient management for seizure and other comorbidities  Subjective:  No further seizure since admission.  He is sleepy but arises is clear.  Denies focal weakness or numbness or vision change.  Objective: Pulse Rate:  [67-108] 72 (01/20 0531) Resp:  [12-21] 21 (01/20 0531) BP: (126-178)/(62-107) 172/83 (01/20 0531) SpO2:  [86 %-100 %] 98 % (01/20 0531) Physical Exam: GEN: Sleepy but arouses easily, morbidly obese Head: normocephalic and atraumatic  Eyes: PERRL, EOMI Oropharynx: No tongue bite, poor dentition CVS: RRR, nl s1 & s2, no murmurs, no edema RESP: no IWOB, good air movement bilaterally, CTAB GI: Morbidly obese, BS present & normal, soft, NTND MSK: no focal tenderness or notable swelling SKIN: no apparent skin lesion NEURO: Sleepy but arises easily, oriented to self and person only, cranial nerves intact except for mild facial drooping due to underlying Bell's palsy, no pronator drift, motor 5/5 in all extremities bilaterally, light sensation intact  Laboratory: Recent Labs  Lab 08/09/17 2028 08/09/17 2042 08/10/17 0248 08/10/17 0346  WBC 10.4  --  9.7 10.1  HGB 12.3* 12.9* 11.7* 11.1*  HCT 37.0* 38.0* 35.0* 33.8*  PLT 268  --  248 270   Recent Labs  Lab 08/04/17 2351  08/08/17 0412 08/09/17 2028 08/09/17 2042 08/10/17 0248 08/10/17 0346  NA 129*   < > 139 137 141  --  140  K 3.9   < > 4.2 4.6 4.6  --  4.3  CL  93*   < > 106 107 107  --  109  CO2 21*   < > 21* 18*  --   --  19*  BUN 45*   < > 40* 51* 44*  --  49*  CREATININE 4.23*   < > 3.50* 3.66* 3.70* 3.37* 3.26*  CALCIUM 8.5*   < > 9.1 9.2  --   --  8.9  PROT 6.6  --   --  7.2  --   --   --   BILITOT 0.9  --   --  0.7  --   --   --   ALKPHOS 104  --   --  104  --   --   --   ALT 20  --   --  25  --   --   --   AST 27  --   --  43*  --   --   --   GLUCOSE 615*   < > 214* 195* 194*  --  137*   <  > = values in this interval not displayed.    Imaging/Diagnostic Tests: Ct Head Wo Contrast  Result Date: 08/09/2017 CLINICAL DATA:  Left-sided weakness. Altered level of consciousness. Seizure. EXAM: CT HEAD WITHOUT CONTRAST CT CERVICAL SPINE WITHOUT CONTRAST TECHNIQUE: Multidetector CT imaging of the head and cervical spine was performed following the standard protocol without intravenous contrast. Multiplanar CT image reconstructions of the cervical spine were also generated. COMPARISON:  08/05/2017 FINDINGS: CT HEAD FINDINGS Brain: No evidence of acute infarction, hemorrhage, hydrocephalus, extra-axial collection or mass lesion/mass effect. Vascular: No hyperdense vessel or unexpected calcification. Skull: Normal. Negative for fracture or focal lesion. Sinuses/Orbits: No acute finding. Other: None. CT CERVICAL SPINE FINDINGS Alignment: Normal. Skull base and vertebrae: No acute fracture. No primary bone lesion or focal pathologic process. Soft tissues and spinal canal: No prevertebral fluid or swelling. No visible canal hematoma. Disc levels:  Unremarkable Upper chest: Negative. Other: None IMPRESSION: 1. No acute intracranial abnormalities.  Normal brain 2. No evidence for cervical spine fracture or dislocation. Electronically Signed   By: Kerby Moors M.D.   On: 08/09/2017 20:50   Ct Cervical Spine Wo Contrast  Result Date: 08/09/2017 CLINICAL DATA:  Left-sided weakness. Altered level of consciousness. Seizure. EXAM: CT HEAD WITHOUT CONTRAST CT CERVICAL SPINE WITHOUT CONTRAST TECHNIQUE: Multidetector CT imaging of the head and cervical spine was performed following the standard protocol without intravenous contrast. Multiplanar CT image reconstructions of the cervical spine were also generated. COMPARISON:  08/05/2017 FINDINGS: CT HEAD FINDINGS Brain: No evidence of acute infarction, hemorrhage, hydrocephalus, extra-axial collection or mass lesion/mass effect. Vascular: No hyperdense vessel or  unexpected calcification. Skull: Normal. Negative for fracture or focal lesion. Sinuses/Orbits: No acute finding. Other: None. CT CERVICAL SPINE FINDINGS Alignment: Normal. Skull base and vertebrae: No acute fracture. No primary bone lesion or focal pathologic process. Soft tissues and spinal canal: No prevertebral fluid or swelling. No visible canal hematoma. Disc levels:  Unremarkable Upper chest: Negative. Other: None IMPRESSION: 1. No acute intracranial abnormalities.  Normal brain 2. No evidence for cervical spine fracture or dislocation. Electronically Signed   By: Kerby Moors M.D.   On: 08/09/2017 20:50   Dg Chest Port 1 View  Result Date: 08/09/2017 CLINICAL DATA:  Seizures EXAM: PORTABLE CHEST 1 VIEW COMPARISON:  August 04, 2017 FINDINGS: The study is limited due to low lung volumes. The heart appears enlarged but stable. The hila and mediastinum are unchanged. Mild  atelectasis in the left base. No overt edema. No nodule or mass. No focal infiltrate. IMPRESSION: No active disease. Electronically Signed   By: Dorise Bullion III M.D   On: 08/09/2017 21:03     Mercy Riding, MD 08/10/2017, 7:04 AM PGY-3, [place] Intern pager: [phone], text pages welcome

## 2017-08-10 NOTE — ED Notes (Signed)
Patient pulled IV out of R hand. Patient got out of bed, ambulating in room. Blood all over his gown, bed, floor. Patient instructed to sit down in recliner while patient/bed/room cleaned of blood. Clean gown applied by RN and NT. Patient instructed repeatedly not to get out of bed & not to remove IV in R arm. IV wrapped with coban by NT to prevent patient from pulling out IV. Patient verbalized understanding repeatedly.  [place] alarm applied to bed.  Lunch tray delivered. Patient eating lunch.

## 2017-08-10 NOTE — Care Management Note (Signed)
Case Management Note  Patient Details  Name: Jeremy [name] MRN: [id number] Date of Birth: 12-Feb-1966  Subjective/Objective:    Received call from NS in ED. Pt will be admitted, will defer to floor CM.                 Action/Plan:CM will follow closely for disposition/discharge needs.    Expected Discharge Date:                  Expected Discharge Plan:     In-House Referral:     Discharge planning Services     Post Acute Care Choice:    Choice offered to:     DME Arranged:    DME Agency:     HH Arranged:    HH Agency:     Status of Service:     If discussed at [employer], dates discussed:    Additional Comments:  Delrae Sawyers, RN 08/10/2017, 10:53 AM

## 2017-08-10 NOTE — Progress Notes (Signed)
Inpatient Diabetes Program Recommendations  AACE/ADA: New Consensus Statement on Inpatient Glycemic Control (2015)  Target Ranges:  Prepandial:   less than 140 mg/dL      Peak postprandial:   less than 180 mg/dL (1-2 hours)      Critically ill patients:  140 - 180 mg/dL  Results for SURESH, AUDI (MRN [id number]) as of 08/10/2017 09:10  Ref. Range 08/10/2017 02:35 08/10/2017 06:43  Glucose-Capillary Latest Ref Range: 65 - 99 mg/dL 160 (H) 100 (H)   Results for EHSAN, CORVIN (MRN [id number]) as of 08/10/2017 09:10  Ref. Range 04/17/2017 15:48 08/05/2017 05:18  Hemoglobin A1C Latest Ref Range: 4.8 - 5.6 % 8.1 >15.5 (H)   Review of Glycemic Control  Diabetes history: DM2 Outpatient Diabetes medications: Tresiba 25 units QHS, Novolog 0-9 units TID with meals and 0-5 units QHS (per home med list; pt is waiting to get approval for Novolog insulin) Current orders for Inpatient glycemic control: Lantus 20 units QHS, Novolog 0-20 units TID with meals, Novolog 0-5 units QHS  Inpatient Diabetes Program Recommendations: HgbA1C: A1C 15.5% on 08/05/17 during recent hospital admission. Patient was just started on Tresiba and Novolog correction at time of discharge on 08/08/2017. Patient was seen by Diabetes Coordinator on 08/06/17 and again on 08/07/17 regarding DM control and insulin. Will plan to follow up with patient on 08/11/2017.  NOTE: Patient was recently an inpatient from 08/05/17 to 08/08/17 and was seen by Diabetes Coordinators on 08/06/17 and 08/07/17. Patient was discharged on Tresiba and Novolog at time of discharge on 08/08/17. Noted on home medication list that Novolog is "waiting on approval from insurance".  Diabetes Coordinator is not on campus over the weekend but available for questions by pager from 8am to [place] with current inpatient DM orders.  Will plan to follow up with patient on 08/11/17 regarding Novolog.   Thanks, Barnie Alderman, RN, MSN, CDE Diabetes Coordinator Inpatient Diabetes  Program [phone] (Team Pager from 8am to 5pm)

## 2017-08-10 NOTE — ED Notes (Signed)
Patient still not in room at this time.  

## 2017-08-10 NOTE — ED Notes (Signed)
Carb modified diet ordered for patient via phone.

## 2017-08-10 NOTE — ED Notes (Signed)
Pt resting on stretcher, pt removed all monitoring devices again. Pt reconnected to CCM, side rails padded for safety, family at bedside.  No further seizure activity noted.

## 2017-08-10 NOTE — ED Notes (Signed)
Patient transported to MRI via stretcher.

## 2017-08-11 ENCOUNTER — Inpatient Hospital Stay (HOSPITAL_COMMUNITY): Payer: 59

## 2017-08-11 DIAGNOSIS — N184 Chronic kidney disease, stage 4 (severe): Secondary | ICD-10-CM

## 2017-08-11 DIAGNOSIS — I10 Essential (primary) hypertension: Secondary | ICD-10-CM | POA: Diagnosis not present

## 2017-08-11 DIAGNOSIS — K029 Dental caries, unspecified: Secondary | ICD-10-CM

## 2017-08-11 DIAGNOSIS — D352 Benign neoplasm of pituitary gland: Secondary | ICD-10-CM | POA: Diagnosis not present

## 2017-08-11 DIAGNOSIS — R569 Unspecified convulsions: Secondary | ICD-10-CM | POA: Diagnosis not present

## 2017-08-11 LAB — GLUCOSE, CAPILLARY
GLUCOSE-CAPILLARY: 117 mg/dL — AB (ref 65–99)
GLUCOSE-CAPILLARY: 207 mg/dL — AB (ref 65–99)

## 2017-08-11 MED ORDER — LEVETIRACETAM 500 MG PO TABS
1000.0000 mg | ORAL_TABLET | Freq: Two times a day (BID) | ORAL | Status: DC
  Administered 2017-08-11: 1000 mg via ORAL
  Filled 2017-08-11: qty 2

## 2017-08-11 MED ORDER — INSULIN PEN NEEDLE 29G X 12.7MM MISC: 1.0000 "application " | Freq: Every day | 0 refills | Status: DC

## 2017-08-11 MED ORDER — INSULIN LISPRO 100 UNIT/ML ~~LOC~~ SOLN: SUBCUTANEOUS | 3 refills | Status: DC

## 2017-08-11 MED ORDER — CARVEDILOL 6.25 MG PO TABS: 6.2500 mg | ORAL_TABLET | Freq: Two times a day (BID) | ORAL | 0 refills | Status: DC

## 2017-08-11 MED ORDER — INSULIN LISPRO 100 UNIT/ML ~~LOC~~ SOLN: SUBCUTANEOUS | 0 refills | Status: DC

## 2017-08-11 MED ORDER — LEVETIRACETAM 1000 MG PO TABS: 1000.0000 mg | ORAL_TABLET | Freq: Two times a day (BID) | ORAL | 2 refills | Status: DC

## 2017-08-11 NOTE — Progress Notes (Signed)
Family Medicine Teaching Service Daily Progress Note Intern Pager: [phone]  Patient name: Jeremy [name] Medical record number: [id number] Date of birth: 11-22-1965 Age: 52 y.o. Gender: male  Primary Care Provider: [place] Bing, DO Consultants: neurology Code Status: full code  Pt Overview and Major Events to Date:  1/19 admitted for seizure-like activity, Neurology consulted 1/20 Brain MRI, tried to leave AMA overnight  Assessment and Plan: Jeremy [name] is a 52 y.o. male presenting with persistent seizures. PMH is significant for T2DM, CKD IV, Bell's Palsy of R eye, HLD, Obesity, HTN.  Seizure Patient with unclear etiology of these seizures. Received Keppra, ativan, and fosphenytoin load. MRI negative aside from incidental 5mm adenoma on pituitary. Patient to undergo EEG. Neurology following. Likely will need to go home on anti-epileptic given his readmission. No further seizures since this admission. Switched from IV to PO keppra as patient ripped his iv out overnight. - f/u neurology recommendations - F/U EEG - Vitals per floor routine - continue keppra 1g bid po - Ativan 1mg  prn seizure >5 min - am BMP, CBC - Bedside swallow screen - neuro checks - fall, seizure precautions - supplemental O2 as needed to maintain sats >92%  Type 2 Diabetes with hyperglycemia. Not in DKA or HHS.  A1c >15.5 07/2017, CBG on admission 194. Well controlled on dose of tresiba he was discharged on. Apparently his insurance has humalog and not novolog on formula. Will switch to humalog at discharge. - Lantus 20u - rSSI - CBG monitoring - Consult of diabetic coordinator for diabetic teaching.  - Ensure he gets his meds on discharge.  AKI on CKD IV: Improved.  Serum creatinine 3.26.  Baseline 2.8-3.0. - Holding nephrotoxic meds - Continue IV fluid.  Reduced to 75 cc due to BP - Daily BMET  HLD Cholesterol 171 in 2017. Continue atorvastatin 40mg  daily once able to take  PO.  HTN Hypertensive to 170s/80s this morning. Unable to CR coreg as outpt as not preferred. Will switch to BID dosing of this medication on discharge. - Reduce IV fluid to 75 cc/h. - hydral 20mg  q 8 hours, coreg cr 40mg , norvasc 10mg  daily - PRN hydralazine 10 mg for SBP greater than 180  OSA: Noted to have intermittent desaturation to mid 80s and apnea overnight. He score 7 on STOP BANG.  -Oxygen as needed -CPAP nightly -We will order home CPAP.  He need outpatient sleep study  Financial issues: Patient has not been able to afford most of his medications. -Case management consulted   FEN/GI: NPO until passes bedside swallow  Prophylaxis: lovenox  Disposition:  Continue inpatient management for seizure and other comorbidities  Subjective:  No further seizure since admission.  Patient wanting to leave AMA due to financial issues, able to talk him out of this at least until EEG performed.   Objective: Temp:  [97.9 F (36.6 C)-98.4 F (36.9 C)] 98.4 F (36.9 C) (01/21 0335) Pulse Rate:  [63-80] 80 (01/21 0335) Resp:  [14-20] 18 (01/21 0335) BP: (106-170)/(58-100) 131/90 (01/21 0623) SpO2:  [91 %-100 %] 91 % (01/21 0335) Weight:  [305 lb 8.9 oz (138.6 kg)] 305 lb 8.9 oz (138.6 kg) (01/20 1913) Physical Exam: GEN: Sitting at edge of bed, morbidly obese Head: normocephalic and atraumatic  Eyes: PERRL, EOMI Oropharynx: No tongue bite, poor dentition CVS: RRR, nl s1 & s2, no murmurs, no edema RESP: no IWOB, good air movement bilaterally, CTAB GI: Morbidly obese, BS present & normal, soft, NTND MSK: no focal tenderness or  notable swelling SKIN: no apparent skin lesion NEURO: appropriate, mild facial droop of right face. CN 2-12 intact, 5/5 strenght BLE, BUE.  Laboratory: Recent Labs  Lab 08/09/17 2028 08/09/17 2042 08/10/17 0248 08/10/17 0346  WBC 10.4  --  9.7 10.1  HGB 12.3* 12.9* 11.7* 11.1*  HCT 37.0* 38.0* 35.0* 33.8*  PLT 268  --  248 270   Recent Labs   Lab 08/04/17 2351  08/08/17 0412 08/09/17 2028 08/09/17 2042 08/10/17 0248 08/10/17 0346  NA 129*   < > 139 137 141  --  140  K 3.9   < > 4.2 4.6 4.6  --  4.3  CL 93*   < > 106 107 107  --  109  CO2 21*   < > 21* 18*  --   --  19*  BUN 45*   < > 40* 51* 44*  --  49*  CREATININE 4.23*   < > 3.50* 3.66* 3.70* 3.37* 3.26*  CALCIUM 8.5*   < > 9.1 9.2  --   --  8.9  PROT 6.6  --   --  7.2  --   --   --   BILITOT 0.9  --   --  0.7  --   --   --   ALKPHOS 104  --   --  104  --   --   --   ALT 20  --   --  25  --   --   --   AST 27  --   --  43*  --   --   --   GLUCOSE 615*   < > 214* 195* 194*  --  137*   < > = values in this interval not displayed.    Imaging/Diagnostic Tests: Mr Brain 40 Contrast  Result Date: 08/10/2017 CLINICAL DATA:  52 year old male with new onset seizures recently. Confusion, left side weakness. EXAM: MRI HEAD WITHOUT CONTRAST TECHNIQUE: Multiplanar, multiecho pulse sequences of the brain and surrounding structures were obtained without intravenous contrast. COMPARISON:  Head and cervical spine CT 08/09/2017, and earlier. FINDINGS: Brain: Cerebral volume is within normal limits. No restricted diffusion to suggest acute infarction. No midline shift, mass effect, evidence of mass lesion, ventriculomegaly, extra-axial collection or acute intracranial hemorrhage. Cervicomedullary junction within normal limits. Mild pituitary enlargement, and evidence of a mildly T2 hyperintense rounded mass occupying the left sella turcica measuring about 8 millimeters diameter (series 7, image 11 and series 13, image 24. Convex upper margin of the pituitary on that side, but no suprasellar extension or mass effect. No evidence of left cavernous sinus involvement. Several scattered small foci of nonspecific subcortical cerebral white matter T2 and FLAIR hyperintensity (e.g. Series 8, image 19). The extent is mild for age. No cortical encephalomalacia or chronic cerebral blood products  identified. Deep gray matter nuclei, brainstem, and cerebellum appear normal. Thin slice coronal T2 imaging through the hippocampal formations appears normal. No mesial temporal lobe asymmetry or abnormality. No heterotopia or other cerebral anomaly identified. Vascular: Major intracranial vascular flow voids are preserved. Skull and upper cervical spine: Negative visualized cervical spine. Normal bone marrow signal. Sinuses/Orbits: Normal orbits soft tissues. Paranasal Visualized paranasal sinuses and mastoids are stable and well pneumatized. Other: Visible internal auditory structures appear normal. Scalp and face soft tissues appear negative. IMPRESSION: 1.  No acute intracranial abnormality. No hippocampal abnormality. Mild for age nonspecific cerebral white matter signal changes. 2. Small 8 mm lesion suspected within the left  pituitary gland. Top differential considerations are microadenoma and Rathke's cleft cyst. A dedicated pituitary protocol brain MRI without and with contrast could characterize further - although given absent evidence of regional mass effect or invasion into surrounding structures - the lesion disposition likely at most would include correlation with endocrine function tests, evidence of pituitary hypersecretion. (ACR consensus guidelines: Management of Incidental Pituitary Findings on CT, MRI and F18-FDG PET: A White Paper of the ACR Incidental Findings Committee. J Am Coll Radiol 2018; 15: 352-48.) Electronically Signed   By: Genevie Ann M.D.   On: 08/10/2017 10:36     Guadalupe Dawn, MD 08/11/2017, 7:31 AM PGY-3, [place] Intern pager: [phone], text pages welcome

## 2017-08-11 NOTE — Discharge Instructions (Signed)
Epilepsy Epilepsy is when a person keeps having seizures. A seizure is unusual activity in the brain. A seizure can change how you think or behave, and it can make it hard to be aware of what is happening. This condition can cause problems, such as:  Falls, accidents, and injury.  Depression.  Poor memory.  Sudden unexplained death in epilepsy (SUDEP). This is rare. Its cause is not known.  Most people with epilepsy lead normal lives. Follow these instructions at home: Medicines   Take medicines only as told by your doctor.  Avoid anything that may keep your medicine from working, such as alcohol. Activity  Get enough rest. Lack of sleep can make seizures more likely to occur.  Follow your doctors advice about driving, swimming, and doing anything else that would be dangerous if you had a seizure. Teaching others Teach friends and family what to do if you have a seizure. They should:  Lay you on the ground to prevent a fall.  Cushion your head and body.  Loosen any tight clothing around your neck.  Turn you on your side.  Stay with you until you are better.  Not hold you down.  Not put anything in your mouth.  Know whether or not you need emergency care.  General instructions  Avoid anything that causes you to have seizures.  Keep a seizure diary. Write down what you remember about each seizure, and especially what might have caused it.  Keep all follow-up visits as told by your doctor. This is important. Contact a doctor if:  You have a change in your seizure pattern.  You get an infection or start to feel sick. You may have more seizures when you are sick. Get help right away if:  A seizure does not stop after 5 minutes.  You have more than one seizure in a row, and you do not have enough time between the seizures to feel better.  A seizure makes it harder to breathe.  A seizure is different from other seizures you have had.  A seizure makes  you unable to speak or use a part of your body.  You did not wake up right after a seizure. This information is not intended to replace advice given to you by your health care provider. Make sure you discuss any questions you have with your health care provider. Document Released: 05/05/2009 Document Revised: 02/12/2016 Document Reviewed: 01/16/2016 Elsevier Interactive Patient Education  2018 [place] were admitted for having symptoms concerning for seizure.  Neurology followed your care and recommended you take Keppra 1000 mg twice daily to help prevent seizures.  In addition, recommendations were placed for you not to drive for 6 months until cleared.  We will arrange for you to follow-up with the neurologist.    Seizure, Adult When you have a seizure:  Parts of your body may move.  How aware or awake (conscious) you are may change.  You may shake (convulse).  Some people have symptoms right before a seizure happens. These symptoms may include:  Fear.  Worry (anxiety).  Feeling like you are going to throw up (nausea).  Feeling like the room is spinning (vertigo).  Feeling like you saw or heard something before (deja vu).  Odd tastes or smells.  Changes in vision, such as seeing flashing lights or spots.  Seizures usually last from 30 seconds to 2 minutes. Usually, they are not harmful unless they last a long time. Follow these instructions at  home: Medicines  Take over-the-counter and prescription medicines only as told by your doctor.  Avoid anything that may keep your medicine from working, such as alcohol. Activity  Do not do any activities that would be dangerous if you had another seizure, like driving or swimming. Wait until your doctor approves.  If you live in the [place] ask your local DMV (department of motor vehicles) when you can drive.  Rest. Teaching others  Teach friends and family what to do when you have a seizure. They should: ? Lay you  on the ground. ? Protect your head and body. ? Loosen any tight clothing around your neck. ? Turn you on your side. ? Stay with you until you are better. ? Not hold you down. ? Not put anything in your mouth. ? Know whether or not you need emergency care. General instructions  Contact your doctor each time you have a seizure.  Avoid anything that gives you seizures.  Keep a seizure diary. Write down: ? What you think caused each seizure. ? What you remember about each seizure.  Keep all follow-up visits as told by your doctor. This is important. Contact a doctor if:  You have another seizure.  You have seizures more often.  There is any change in what happens during your seizures.  You continue to have seizures with treatment.  You have symptoms of being sick or having an infection. Get help right away if:  You have a seizure: ? That lasts longer than 5 minutes. ? That is different than seizures you had before. ? That makes it harder to breathe. ? After you hurt your head.  After a seizure, you cannot speak or use a part of your body.  After a seizure, you are confused or have a bad headache.  You have two or more seizures in a row.  You are having seizures more often.  You do not wake up right after a seizure.  You get hurt during a seizure. In an emergency:  These symptoms may be an emergency. Do not wait to see if the symptoms will go away. Get medical help right away. Call your local emergency services (911 in the [place] Do not drive yourself to the hospital. This information is not intended to replace advice given to you by your health care provider. Make sure you discuss any questions you have with your health care provider. Document Released: 12/25/2007 Document Revised: 03/20/2016 Document Reviewed: 03/20/2016 Elsevier Interactive Patient Education  2017 [place]  Per [employer] statutes, patients with seizures are not allowed to drive  until  they have been seizure-free for six months. Use caution when using heavy equipment or power tools. Avoid working on ladders or at heights. Take showers instead of baths. Ensure the water temperature is not too high on the home water heater. Do not go swimming alone. When caring for infants or small children, sit down when holding, feeding, or changing them to minimize risk of injury to the child in the event you have a seizure.   Also, Maintain good sleep hygiene. Avoid alcohol.  -->Call 911 and bring the patient back to the ED if:  A. The seizure lasts longer than 5 minutes.  B. The patient doesn't awaken shortly after the seizure C. The patient has new problems such as difficulty seeing, speaking or moving D. The patient was injured during the seizure E. The patient has a temperature over 102 F (39C) F. The patient vomited and  now is having trouble breathing

## 2017-08-11 NOTE — Progress Notes (Signed)
Subjective: Patient is sitting at the edge of the bed in no distress.  He has not had any further events.  Patient has had an EEG this morning awaiting final reading.  Exam: Vitals:   08/11/17 0623 08/11/17 0806  BP: 131/90 (!) 163/87  Pulse:  72  Resp:  20  Temp:  98.3 F (36.8 C)  SpO2:  98%    Physical Exam   HEENT-  Normocephalic, no lesions, without obvious abnormality.  Normal external eye and conjunctiva.   Cardiovascular- S1-S2 audible, pulses palpable throughout   Lungs-no rhonchi or wheezing noted, no excessive working breathing.  Saturations within normal limits Abdomen- All 4 quadrants palpated and nontender Extremities- Warm, dry and intact Musculoskeletal-no joint tenderness, deformity or swelling Skin-warm and dry, no hyperpigmentation, vitiligo, or suspicious lesions    Neuro:  Mental Status: Alert, oriented, thought content appropriate.  Speech fluent without evidence of aphasia.  Able to follow 3 step commands without difficulty. Cranial Nerves: II: Discs flat bilaterally; Visual fields grossly normal,  III,IV, VI: ptosis not present, extra-ocular motions intact bilaterally pupils equal, round, reactive to light and accommodation V,VII: smile symmetric, facial light touch sensation normal bilaterally VIII: hearing normal bilaterally IX,X: uvula rises symmetrically XI: bilateral shoulder shrug XII: midline tongue extension Motor: Right : Upper extremity   5/5    Left:     Upper extremity   5/5  Lower extremity   5/5     Lower extremity   5/5 Tone and bulk:normal tone throughout; no atrophy noted Sensory: Pinprick and light touch intact throughout, bilaterally Deep Tendon Reflexes: 2+ and symmetric throughout Plantars: Right: downgoing   Left: downgoing Cerebellar: normal finger-to-nose, normal rapid alternating movements and normal heel-to-shin test   Medications:  Scheduled: . amLODipine  10 mg Oral Daily  . atorvastatin  40 mg Oral Daily  .  carvedilol  40 mg Oral Daily  . enoxaparin (LOVENOX) injection  40 mg Subcutaneous Daily  . hydrALAZINE  20 mg Oral Q8H  . insulin aspart  0-20 Units Subcutaneous TID WC  . insulin aspart  0-5 Units Subcutaneous QHS  . insulin glargine  20 Units Subcutaneous QHS  . levETIRAcetam  1,000 mg Oral BID   Continuous: . sodium chloride Stopped (08/10/17 1900)   JKD:[id number], LORazepam  Pertinent Labs/Diagnostics: None  Ct Head Wo Contrast  Result Date: 08/09/2017 CLINICAL DATA:  Left-sided weakness. Altered level of consciousness. Seizure. EXAM: CT HEAD WITHOUT CONTRAST CT CERVICAL SPINE WITHOUT CONTRAST TECHNIQUE: Multidetector CT imaging of the head and cervical spine was performed following the standard protocol without intravenous contrast. Multiplanar CT image reconstructions of the cervical spine were also generated. COMPARISON:  08/05/2017 FINDINGS: CT HEAD FINDINGS Brain: No evidence of acute infarction, hemorrhage, hydrocephalus, extra-axial collection or mass lesion/mass effect. Vascular: No hyperdense vessel or unexpected calcification. Skull: Normal. Negative for fracture or focal lesion. Sinuses/Orbits: No acute finding. Other: None. CT CERVICAL SPINE FINDINGS Alignment: Normal. Skull base and vertebrae: No acute fracture. No primary bone lesion or focal pathologic process. Soft tissues and spinal canal: No prevertebral fluid or swelling. No visible canal hematoma. Disc levels:  Unremarkable Upper chest: Negative. Other: None IMPRESSION: 1. No acute intracranial abnormalities.  Normal brain 2. No evidence for cervical spine fracture or dislocation. Electronically Signed   By: Kerby Moors M.D.   On: 08/09/2017 20:50   Ct Cervical Spine Wo Contrast  Result Date: 08/09/2017 CLINICAL DATA:  Left-sided weakness. Altered level of consciousness. Seizure. EXAM: CT HEAD WITHOUT CONTRAST CT CERVICAL  SPINE WITHOUT CONTRAST TECHNIQUE: Multidetector CT imaging of the head and cervical spine was  performed following the standard protocol without intravenous contrast. Multiplanar CT image reconstructions of the cervical spine were also generated. COMPARISON:  08/05/2017 FINDINGS: CT HEAD FINDINGS Brain: No evidence of acute infarction, hemorrhage, hydrocephalus, extra-axial collection or mass lesion/mass effect. Vascular: No hyperdense vessel or unexpected calcification. Skull: Normal. Negative for fracture or focal lesion. Sinuses/Orbits: No acute finding. Other: None. CT CERVICAL SPINE FINDINGS Alignment: Normal. Skull base and vertebrae: No acute fracture. No primary bone lesion or focal pathologic process. Soft tissues and spinal canal: No prevertebral fluid or swelling. No visible canal hematoma. Disc levels:  Unremarkable Upper chest: Negative. Other: None IMPRESSION: 1. No acute intracranial abnormalities.  Normal brain 2. No evidence for cervical spine fracture or dislocation. Electronically Signed   By: Kerby Moors M.D.   On: 08/09/2017 20:50   Mr Brain Wo Contrast  Result Date: 08/10/2017 CLINICAL DATA:  52 year old male with new onset seizures recently. Confusion, left side weakness. EXAM: MRI HEAD WITHOUT CONTRAST TECHNIQUE: Multiplanar, multiecho pulse sequences of the brain and surrounding structures were obtained without intravenous contrast. COMPARISON:  Head and cervical spine CT 08/09/2017, and earlier. FINDINGS: Brain: Cerebral volume is within normal limits. No restricted diffusion to suggest acute infarction. No midline shift, mass effect, evidence of mass lesion, ventriculomegaly, extra-axial collection or acute intracranial hemorrhage. Cervicomedullary junction within normal limits. Mild pituitary enlargement, and evidence of a mildly T2 hyperintense rounded mass occupying the left sella turcica measuring about 8 millimeters diameter (series 7, image 11 and series 13, image 24. Convex upper margin of the pituitary on that side, but no suprasellar extension or mass effect. No  evidence of left cavernous sinus involvement. Several scattered small foci of nonspecific subcortical cerebral white matter T2 and FLAIR hyperintensity (e.g. Series 8, image 19). The extent is mild for age. No cortical encephalomalacia or chronic cerebral blood products identified. Deep gray matter nuclei, brainstem, and cerebellum appear normal. Thin slice coronal T2 imaging through the hippocampal formations appears normal. No mesial temporal lobe asymmetry or abnormality. No heterotopia or other cerebral anomaly identified. Vascular: Major intracranial vascular flow voids are preserved. Skull and upper cervical spine: Negative visualized cervical spine. Normal bone marrow signal. Sinuses/Orbits: Normal orbits soft tissues. Paranasal Visualized paranasal sinuses and mastoids are stable and well pneumatized. Other: Visible internal auditory structures appear normal. Scalp and face soft tissues appear negative. IMPRESSION: 1.  No acute intracranial abnormality. No hippocampal abnormality. Mild for age nonspecific cerebral white matter signal changes. 2. Small 8 mm lesion suspected within the left pituitary gland. Top differential considerations are microadenoma and Rathke's cleft cyst. A dedicated pituitary protocol brain MRI without and with contrast could characterize further - although given absent evidence of regional mass effect or invasion into surrounding structures - the lesion disposition likely at most would include correlation with endocrine function tests, evidence of pituitary hypersecretion. (ACR consensus guidelines: Management of Incidental Pituitary Findings on CT, MRI and F18-FDG PET: A White Paper of the ACR Incidental Findings Committee. J Am Coll Radiol 2018; 15: 735-32.) Electronically Signed   By: Genevie Ann M.D.   On: 08/10/2017 10:36   Dg Chest Port 1 View  Result Date: 08/09/2017 CLINICAL DATA:  Seizures EXAM: PORTABLE CHEST 1 VIEW COMPARISON:  August 04, 2017 FINDINGS: The study is  limited due to low lung volumes. The heart appears enlarged but stable. The hila and mediastinum are unchanged. Mild atelectasis in the left base. No  overt edema. No nodule or mass. No focal infiltrate. IMPRESSION: No active disease. Electronically Signed   By: Dorise Bullion III M.D   On: 08/09/2017 21:03     Etta Quill PA-C [facility] [phone]  Impression: Seizure activity resolved and currently on Keppra 1000 mg twice daily.  MRI brain negative for any intracranial abnormalities   Recommendations: 1) patient needs to follow-up as an outpatient for theSmall 8 mm lesion suspected within the left pituitary gland. Top differential considerations are microadenoma and Rathke's cleft cyst.  2) Keppra 1000 mg twice daily at discharge 3) she needs to follow-up with neurology as an outpatient 4) Per [facility] statutes, patients with seizures are not allowed to drive until  they have been seizure-free for six months. Use caution when using heavy equipment or power tools. Avoid working on ladders or at heights. Take showers instead of baths. Ensure the water temperature is not too high on the home water heater. Do not go swimming alone. When caring for infants or small children, sit down when holding, feeding, or changing them to minimize risk of injury to the child in the event you have a seizure.   Also, Maintain good sleep hygiene. Avoid alcohol.  --> Call 911 and bring the patient back to the ED if:                   A.  The seizure lasts longer than 5 minutes.                  B.  The patient doesn't awaken shortly after the seizure             C.  The patient has new problems such as difficulty seeing, speaking or moving             D.  The patient was injured during the seizure             E.  The patient has a temperature over 102 F (39C)             F.  The patient vomited and now is having trouble breathing    08/11/2017, 11:26 AM   Attending addendum Case  discussed with the APP. Agree with the plan.  -- Amie Portland, MD [facility] Pager: [phone] If 7pm to 7am, please call on call as listed on AMION.

## 2017-08-11 NOTE — Care Management Note (Addendum)
Case Management Note  Patient Details  Name: Jeremy [name] MRN: [id number] Date of Birth: [birth year]  Subjective/Objective:   Admitted for seizure activity.             Action/Plan: Prior to admission patient lived at home with girlfriend.  Call placed to [facility] [phone] spoke with Inez Catalina, she states "Carvedilol CR" is not covered $506.00 and rejection does not provide an alternative just states: Plan exclusion lower cost.  Unfortunately patient has commercial insurance and would not qualify for any medication assistance at this time. Received referral re: CPAP needs.  Call placed to [place] and  Per Coralyn Mark all Inpatient sleep study referral have to have consultation with Burley Saver or PCP first.  Call placed to [facility] for consultation for CPAP on September 19, 2017.    Expected Discharge Plan:  Home/Self Care  In-House Referral:  Clinical Social Work  Discharge planning Services  CM Consult, Follow-up appt scheduled, Medication Assistance(Pulmonary consultation)   Status of Service: In process, will continue to follow for discharge transition home.   Kristen Cardinal, RN  Nurse case manager [phone] 08/11/2017, 1:06 PM

## 2017-08-11 NOTE — Progress Notes (Addendum)
0700 Bedside shift report, pt sitting up on edge of bed, refusing bed alarm. Pt signed AMA paperwork and INT discontinued. Girlfriend at bedside attempting to convince pt to stay and wait for MD and additional testing.   0800 Pt assessed, see flow sheet. Pt updated with POC, seems like he will wait for MD this am, but is definitely leaving this afternoon. Pt encouraged to wait for further testing and not leaving until orders received. RN called EEG and tech on way up. Pt informed, pt refusing fall precautions. Will continue to monitor.   0830 MD at bedside, updated with POC.   1000 Pt medicated per MAR, no acute distress, no seizures noted, WCTM.   1300 Pt eating lunch, awaiting discharge, no complaints at this time.   1500 Case manager in room to speak to pt about medication assistance. Unfortunately, pt does not qualify for assistance because he had insurance. Case manager provided pt with resource booklet for mail in low cost medications. Pt and family understand without assistance.   1530 Pt discharged home. Discharge paperwork, medication list, new meds, and follow up appointments discussed with pt and family. All questions answered and RN stress importance about not driving per MD and [place] law for 6 months or until seizure free for 6 months. Pt understands without assistance. MD paged for prescription for insulin needles, awaiting call back.  17 MD sent prescription to [facility], pt down to car via wheel chair with all personal belongings including cell phone and charger.

## 2017-08-11 NOTE — Progress Notes (Signed)
EEG complete - results pending 

## 2017-08-11 NOTE — Progress Notes (Signed)
Inpatient Diabetes Program Recommendations  AACE/ADA: New Consensus Statement on Inpatient Glycemic Control (2015)  Target Ranges:  Prepandial:   less than 140 mg/dL      Peak postprandial:   less than 180 mg/dL (1-2 hours)      Critically ill patients:  140 - 180 mg/dL   Results for KASTEN, LEVEQUE (MRN [id number]) as of 08/11/2017 09:42  Ref. Range 08/10/2017 06:43 08/10/2017 12:07 08/10/2017 17:11 08/10/2017 22:01 08/11/2017 08:02  Glucose-Capillary Latest Ref Range: 65 - 99 mg/dL 100 (H) 105 (H) 190 (H) 141 (H) 117 (H)   Review of Glycemic Control  Inpatient Diabetes Program Recommendations:   HgbA1C: A1C 15.5% on 08/05/17 during recent hospital admission. Patient was just started on Tresiba and Novolog correction at time of discharge on 08/08/2017. Patient was waiting on insurance approval for Novolog.   Looking up [facility] preferred medications, Humalog is actually preferred, Novolog is a tier 4. Consider ordering Humalog at time of d/c.  Thanks,  Tama Headings RN, MSN, [facility] Inpatient Diabetes Coordinator Team Pager [phone] (8a-5p)

## 2017-08-11 NOTE — Care Management Note (Signed)
Case Management Note Original note by Priscille Heidelberg, RN, Case Manager  Patient Details  Name: Jeremy [name] MRN: [id number] Date of Birth: [birth year]  Subjective/Objective:   Admitted for seizure activity.             Action/Plan: Prior to admission patient lived at home with girlfriend.  Call placed to [facility] [phone] spoke with Inez Catalina, she states "Carvedilol CR" is not covered $506.00 and rejection does not provide an alternative just states: Plan exclusion lower cost.  Unfortunately patient has commercial insurance and would not qualify for any medication assistance at this time. Received referral re: CPAP needs.  Call placed to [place] and  Per Coralyn Mark all Inpatient sleep study referral have to have consultation with Burley Saver or PCP first.  Call placed to [facility] for consultation for CPAP on September 19, 2017.    Expected Discharge Plan:  Home/Self Care  In-House Referral:  Clinical Social Work  Discharge planning Services  CM Consult, Follow-up appt scheduled, Medication Assistance(Pulmonary consultation)   Status of Service:  Completed, will sign off  08/11/17 J. Tomy Khim, RN, BSN  1500 Met with pt and wife to discuss medication issues.  Wife asking for assistance with Freestyle Libra discs as copay is $79.  Pt not eligible for medication assistance as he has insurance.  Pt states he has regular lancets and meter to check CBG if needed.  Provided med list and Rx Outreach application for pt and wife.     Ella Bodo, RN  Nurse case manager [phone] 08/11/2017, 5:07 PM

## 2017-08-11 NOTE — Procedures (Signed)
ELECTROENCEPHALOGRAM REPORT  Date of Study: 08/11/2017  Patient's Name: Jeremy [name] MRN: [id number] Date of Birth: [birth year]  Referring Provider: Dr. Karena Addison Aroor  Clinical History: This is a 52 year old man with new onset seizures  Medications: Keppra  Technical Summary: A multichannel digital EEG recording measured by the international 10-20 system with electrodes applied with paste and impedances below 5000 ohms performed in our laboratory with EKG monitoring in an awake patient.  Hyperventilation and photic stimulation were not performed.  The digital EEG was referentially recorded, reformatted, and digitally filtered in a variety of bipolar and referential montages for optimal display.    Description: The patient is awake during the recording.  During maximal wakefulness, there is a medium voltage 9 Hz posterior dominant rhythm that attenuates with eye opening, better formed over the left occipital region. There is slight asymmetry in voltage of posterior dominant rhythm, within normal limits. No focal slowing seen. Sleep was not captured. Hyperventilation and photic stimulation were not performed. There was EKG artifact throughout the recording.  There were no epileptiform discharges or electrographic seizures seen.    EKG lead was unremarkable.  Impression: This awake EEG is within normal limits.  Clinical Correlation: A normal EEG does not exclude a clinical diagnosis of epilepsy.  Clinical correlation is advised.   Ellouise Newer, M.D.

## 2017-08-12 LAB — PROLACTIN: PROLACTIN: 14.3 ng/mL (ref 4.0–15.2)

## 2017-08-14 ENCOUNTER — Other Ambulatory Visit: Payer: Self-pay

## 2017-08-14 ENCOUNTER — Ambulatory Visit (INDEPENDENT_AMBULATORY_CARE_PROVIDER_SITE_OTHER): Payer: 59 | Admitting: Family Medicine

## 2017-08-14 ENCOUNTER — Encounter: Payer: Self-pay | Admitting: Family Medicine

## 2017-08-14 VITALS — BP 164/92 | HR 88 | Temp 98.7°F | Ht 68.0 in | Wt 309.0 lb

## 2017-08-14 DIAGNOSIS — I10 Essential (primary) hypertension: Secondary | ICD-10-CM | POA: Diagnosis not present

## 2017-08-14 DIAGNOSIS — R569 Unspecified convulsions: Secondary | ICD-10-CM | POA: Diagnosis not present

## 2017-08-14 NOTE — Discharge Summary (Signed)
[facility] Discharge Summary  Patient name: Jeremy [name] Medical record number: [id number] Date of birth: 06/14/1966 Age: 52 y.o. Gender: male Date of Admission: 08/09/2017  Date of Discharge: 08/11/2017 Admitting Physician: Blane Ohara McDiarmid, MD  Primary Care Provider: [place] Bing, DO Consultants: neurology  Indication for Hospitalization: seizures  Discharge Diagnoses/Problem List:  Seizure Type II diabetes mellitus AKI on CKD iV HLD HTN OSA   Disposition: home  Discharge Condition: stable  Discharge Exam: GEN: Sitting at edge of bed, morbidly obese Head: normocephalic and atraumatic  Eyes: PERRL, EOMI Oropharynx: No tongue bite, poor dentition CVS: RRR, nl s1 & s2, no murmurs, no edema RESP: no IWOB, good air movement bilaterally, CTAB GI: Morbidly obese, BS present & normal, soft, NTND MSK: no focal tenderness or notable swelling SKIN: no apparent skin lesion NEURO: appropriate, mild facial droop of right face. CN 2-12 intact, 5/5 strenght BLE, BUE.  Brief Hospital Course:  52 year old gentleman re-admitted for seizures at home. For previous admission summary please refer to dc summary from 1/18. Per reports these seizures consisted of repetitive movements, and facial twitches. These were very different than the pseudoseizure activity the patient exhibited prior to dc at last admission..  Seizures Neurology was consulted and he was started on keppra BID. He also received a phosphenytoin loading dose. After receiving the anti-epileptics he had no seizure activity after admission. He was discharged home on keppra 1g po bid. He had an EEG which had no abnormality. He was discharged home and informed that he cannot drive for 6 months 2/2 seizure activity.  Diabetes Well controlled on tresiba dosing. In the high 100s low 200s during admission. Main issue was changing him to humalog as this is tier 1 on his insurance as opposed to  novolog.  Hypertension Patient's blood pressure better controlled during this admission. Consistently in 150s. Coreg XR changed to BID dosing as this was tier 1 on his insurance.  Issues for Follow Up:  1. Follow up blood sugar control on tresiba 2. Check if patient picked up freestyle libre sensor 3. Follow up blood pressure control on new regimen 4. Follow up seizure activity 5. Check a BMP to monitor kidney function  Significant Procedures: EEG  Significant Labs and Imaging:  Recent Labs  Lab 08/09/17 2028 08/09/17 2042 08/10/17 0248 08/10/17 0346  WBC 10.4  --  9.7 10.1  HGB 12.3* 12.9* 11.7* 11.1*  HCT 37.0* 38.0* 35.0* 33.8*  PLT 268  --  248 270   Recent Labs  Lab 08/08/17 0412 08/09/17 2028 08/09/17 2042 08/10/17 0248 08/10/17 0346  NA 139 137 141  --  140  K 4.2 4.6 4.6  --  4.3  CL 106 107 107  --  109  CO2 21* 18*  --   --  19*  GLUCOSE 214* 195* 194*  --  137*  BUN 40* 51* 44*  --  49*  CREATININE 3.50* 3.66* 3.70* 3.37* 3.26*  CALCIUM 9.1 9.2  --   --  8.9  ALKPHOS  --  104  --   --   --   AST  --  43*  --   --   --   ALT  --  25  --   --   --   ALBUMIN  --  3.3*  --   --   --     Results/Tests Pending at Time of Discharge:  Discharge Medications:  Allergies as of 08/11/2017  No Known Allergies     Medication List    STOP taking these medications   carvedilol 20 MG 24 hr capsule Commonly known as:  COREG CR   insulin aspart 100 UNIT/ML injection Commonly known as:  novoLOG     TAKE these medications   amLODipine 10 MG tablet Commonly known as:  NORVASC Take 1 tablet (10 mg total) by mouth daily.   aspirin EC 81 MG tablet Take 81 mg by mouth daily.   atorvastatin 40 MG tablet Commonly known as:  LIPITOR Take 1 tablet (40 mg total) by mouth daily.   carvedilol 6.25 MG tablet Commonly known as:  COREG Take 1 tablet (6.25 mg total) by mouth 2 (two) times daily with a meal.   hydrALAZINE 10 MG tablet Commonly known as:   APRESOLINE Take 2 tablets (20 mg total) by mouth every 8 (eight) hours.   insulin degludec 100 UNIT/ML Sopn FlexTouch Pen Commonly known as:  TRESIBA Inject 0.25 mLs (25 Units total) into the skin daily at 10 pm for 14 days.   insulin lispro 100 UNIT/ML injection Commonly known as:  HUMALOG Inject 0-5 Units into the skin at bedtime. cbg < 100 0 Units cbg 100-200 1 Units cbg 200-300 2 units cbg 3001-400 3 Units cbg 4001-500 5 Units   insulin lispro 100 UNIT/ML injection Commonly known as:  HUMALOG Inject 0-9 Units into the skin 3 (three) times daily with meals. cbg < 70 0 Units cbg 121-150 1 Units cbg 151-200 2 units cbg 201-250 3 Units cbg 251-300 5 Units cbg 301-350 7 Units cbg 351-400 9 Units   Insulin Pen Needle 29G X 46.5KP Misc 1 application by Does not apply route daily.   levETIRAcetam 1000 MG tablet Commonly known as:  KEPPRA Take 1 tablet (1,000 mg total) by mouth 2 (two) times daily.   naproxen sodium 220 MG tablet Commonly known as:  ALEVE Take 440 mg by mouth as needed.       Discharge Instructions: Please refer to Patient Instructions section of EMR for full details.  Patient was counseled important signs and symptoms that should prompt return to medical care, changes in medications, dietary instructions, activity restrictions, and follow up appointments.   Follow-Up Appointments: Follow-up Information    Chesley Mires, MD. Go on 09/19/2017.   Specialty:  Pulmonary Disease Why:  September 19, 2017 at 9:00am Contact information: [address] [place] [address] [phone]        [place]. Go on 08/14/2017.   Why:  Go to appt at 1:15 PM. Please arrive 15 mons early and bring medications. Contact information: [address] [place] [phone]       [facility] Follow up in 3 day(s).   Specialty:  Neurology Contact information: [address] [id number] mc [address] [place]          Guadalupe Dawn, MD 08/14/2017, 7:20 AM PGY-1, [place]

## 2017-08-14 NOTE — Patient Instructions (Signed)
You were seen in clinic for follow up after your admission to the hospital for seizure.  I am glad that you have been doing well since you left the hospital.  I would like for you to continue monitoring your blood glucose levels as this helps [place] determine the correct regimen.  I would like for you to continue taking your blood pressure medications as prescribed.  It may also be helpful to take your blood pressures at home and take note of when they are higher.  We have checked your blood work today to make sure your kidney function is doing well.  I will call you with the results of these once I have them.   Be well,  Lovenia Kim, MD

## 2017-08-14 NOTE — Progress Notes (Signed)
   Subjective:   Patient ID: Jeremy [name]    DOB: 21-Sep-1965, 52 y.o. male   MRN: [id number]  CC: Hospital follow-up  HPI: Jeremy [name] is a 52 y.o. male who presents to clinic today following discharge from hospital.  He is present today with his girlfriend.  Seizure States he was admitted to the hospital for seizure, which was thought to be related to his high blood sugars.  States he has felt well since discharge and has had no additional seizure activity.  He has been checking his CBGs at home 3x a day and they are better controlled, ranging from 117-230.  He denies dizziness, lightheadedness, abdominal pain, nausea, vomiting.  He denies fevers, chills.  No prior history of seizures.  He has an appointment on March 12th with [facility] for follow-up.  HTN His girlfriend states that she checks blood pressures at home.  They are normally 140/90 to 150/90.  He was recently started on medication for this and reports good compliance with these.  Taking hydralazine 20 mg 3 times daily, carvedilol 6.25 mg twice daily, and amlodipine 10 mg daily.    ROS: Denies nausea, vomiting, abdominal pain, dizziness, shortness of breath, chest pain, palpitations.  Social: never smoker  Medications reviewed. Objective:   BP (!) 164/92   Pulse 88   Temp 98.7 F (37.1 C) (Oral)   Ht _0  (1.727 m)   Wt (!) 309 lb (140.2 kg)   SpO2 97%   BMI 46.98 kg/m  Vitals and nursing note reviewed.  General: 52 year old male, NAD HEENT: NCAT, EOMI, MMM, oropharynx clear Neck: supple CV: RRR no MRG Lungs: CTA B, nonlabored Abdomen: Soft, NT ND, positive bowel sounds Skin: warm, dry, no rash Extremities: warm and well perfused  Assessment & Plan:   Primary hypertension Patient on hydralazine 20 mg 3 times daily, carvedilol 6.25 mg twice daily and amlodipine 10 mg daily.  He is a never smoker.   -Reporting good medication compliance -Blood pressures at home seem stable between  systolic of 346-219/47. -Recheck be met today to monitor creatinine function -He has follow-up with a nephrologist -Continue to monitor  Seizure [facility]) No seizure activity since discharge from hospital. - Follow-up 3/12 scheduled with [facility]  Orders Placed This Encounter  Procedures  . Basic Metabolic Panel   Follow-up: 2 weeks for blood pressure and diabetes  Lovenia Kim, MD [place], PGY-2 08/19/2017 2:54 PM

## 2017-08-15 LAB — BASIC METABOLIC PANEL
BUN / CREAT RATIO: 11 (ref 9–20)
BUN: 33 mg/dL — ABNORMAL HIGH (ref 6–24)
CHLORIDE: 107 mmol/L — AB (ref 96–106)
CO2: 21 mmol/L (ref 20–29)
Calcium: 8.9 mg/dL (ref 8.7–10.2)
Creatinine, Ser: 2.96 mg/dL — ABNORMAL HIGH (ref 0.76–1.27)
GFR calc non Af Amer: 24 mL/min/{1.73_m2} — ABNORMAL LOW (ref >59)
GFR, EST AFRICAN AMERICAN: 27 mL/min/{1.73_m2} — AB (ref >59)
Glucose: 180 mg/dL — ABNORMAL HIGH (ref 65–99)
POTASSIUM: 5.1 mmol/L (ref 3.5–5.2)
SODIUM: 144 mmol/L (ref 134–144)

## 2017-08-18 ENCOUNTER — Telehealth: Payer: Self-pay | Admitting: Family Medicine

## 2017-08-18 NOTE — Telephone Encounter (Signed)
Out of work form dropped off for at front desk for completion.  Verified that patient section of form has been completed.  Last DOS with PCP was 05/02/17, hospital visit 08/14/17 .  Placed form in red team folder to be completed by clinical staff.  Carmina Miller

## 2017-08-19 ENCOUNTER — Other Ambulatory Visit: Payer: Self-pay | Admitting: *Deleted

## 2017-08-19 MED ORDER — HYDRALAZINE HCL 10 MG PO TABS: 20.0000 mg | ORAL_TABLET | Freq: Three times a day (TID) | ORAL | 0 refills | Status: DC

## 2017-08-19 NOTE — Telephone Encounter (Signed)
Leave of absence for patient has been placed in PCP's box for completion of questions.Ozella Almond, CMA

## 2017-08-19 NOTE — Assessment & Plan Note (Signed)
Patient on hydralazine 20 mg 3 times daily, carvedilol 6.25 mg twice daily and amlodipine 10 mg daily.  He is a never smoker.   -Reporting good medication compliance -Blood pressures at home seem stable between systolic of 140-150/90. -Recheck be met today to monitor creatinine function -He has follow-up with a nephrologist -Continue to monitor 

## 2017-08-19 NOTE — Telephone Encounter (Signed)
Patient left message on nurse line requesting return call from PCP to discuss form. Returned patient's call. Informed pt Clinic policy is up to 7 days to return form. Patient states understanding. When form is completed please fax to Andree Moro at [phone]. Hubbard Hartshorn, RN, BSN

## 2017-08-19 NOTE — Telephone Encounter (Signed)
Patient left message on nurse line requesting PCP to call him about this form. Routed to PCP. Danley Danker, RN [facility] RN)

## 2017-08-19 NOTE — Assessment & Plan Note (Signed)
No seizure activity since discharge from hospital. - Follow-up 3/12 scheduled with [facility]

## 2017-08-19 NOTE — Telephone Encounter (Signed)
This pt is calling this form again. I told him we have to allow time for the dr to get the form and fill it out. He said the completed form needs to be faxed to [facility] at [phone] and put Attn: Andree Moro.

## 2017-08-20 ENCOUNTER — Encounter: Payer: Self-pay | Admitting: Family Medicine

## 2017-08-21 NOTE — Telephone Encounter (Signed)
Completed form faxed to Andree Moro of [place] at [phone]. Patient aware and original is at front desk for pickup. Copy made for scanning.  Danley Danker, RN [facility] RN)

## 2017-08-22 ENCOUNTER — Telehealth: Payer: Self-pay

## 2017-08-22 NOTE — Telephone Encounter (Signed)
Patient came to front desk to get copy of work note. Asked that a question be sent to PCP to clarify whether or not he has any driving restrictions. Does not want driving restrictions but does not remember what doctor told him. Danley Danker, RN [facility] RN)

## 2017-08-29 ENCOUNTER — Ambulatory Visit (INDEPENDENT_AMBULATORY_CARE_PROVIDER_SITE_OTHER): Payer: 59 | Admitting: Family Medicine

## 2017-08-29 ENCOUNTER — Other Ambulatory Visit: Payer: Self-pay

## 2017-08-29 ENCOUNTER — Encounter: Payer: Self-pay | Admitting: Family Medicine

## 2017-08-29 VITALS — BP 164/90 | HR 75 | Temp 97.9°F | Wt 296.0 lb

## 2017-08-29 DIAGNOSIS — R569 Unspecified convulsions: Secondary | ICD-10-CM | POA: Diagnosis not present

## 2017-08-29 DIAGNOSIS — M25512 Pain in left shoulder: Secondary | ICD-10-CM | POA: Diagnosis not present

## 2017-08-29 DIAGNOSIS — H538 Other visual disturbances: Secondary | ICD-10-CM | POA: Diagnosis not present

## 2017-08-29 DIAGNOSIS — G8929 Other chronic pain: Secondary | ICD-10-CM | POA: Diagnosis not present

## 2017-08-29 MED ORDER — HYDRALAZINE HCL 10 MG PO TABS: 20.0000 mg | ORAL_TABLET | Freq: Three times a day (TID) | ORAL | 0 refills | Status: DC

## 2017-08-29 MED ORDER — TRAMADOL HCL 50 MG PO TABS: 50.0000 mg | ORAL_TABLET | Freq: Two times a day (BID) | ORAL | 0 refills | Status: AC | PRN

## 2017-08-29 MED ORDER — CARVEDILOL 6.25 MG PO TABS: 6.2500 mg | ORAL_TABLET | Freq: Two times a day (BID) | ORAL | 0 refills | Status: DC

## 2017-08-29 NOTE — Patient Instructions (Addendum)
You were seen in clinic today for right shoulder pain.  I feel most of your pain is muscular in nature and should improve with conservative treatment.  As we discussed, I would like for you to switch from naproxen to Tylenol as this is better for your kidneys.  Additionally, I am prescribing you some tramadol to take for pain that is not relieved by the Tylenol.  Please use the heating pads as this will provide some symptom relief in your symptoms.  If your pain is not improved within 3-4 weeks, I would like for you to return to be seen by provider.  I have placed a referral to ophthalmology for you to have your eyes checked.  You can expect a call within a week or so regarding scheduling an appointment.  I will discuss your case with [facility], where you have a Neurology appointment next month, regarding your ability to drive and will give you a call regarding this.  Be well, Lovenia Kim, MD

## 2017-08-29 NOTE — Progress Notes (Signed)
Subjective:   Patient ID: Jeremy [name]    DOB: 1965/10/06, 52 y.o. male   MRN: [id number]  CC: R shoulder pain, driving s/p seizure, ophthalmology referral   HPI: Jeremy [name] is a 52 y.o. male who presents to clinic today for right shoulder pain, vision abnormality, follow-up seizure.  Problems discussed today are as follows:    Driving s/p seizure Patient with 2 recent hospital admissions, the latter on 1/19 with status epilepticus.  Given Ativan in ED and started on Keppra.  Seen by Neurology while hospitalized and patient states he was advised to hold off on driving until seizure-free x 6 months.  Patient is extremely concerned regarding this as he is motivated to go back to work and not being able to work would impact him significantly from a financial standpoint.  He is taking Keppra 1000 mg twice daily with good compliance.  Has not had any additional seizure activity following discharge from hospital exam.  He has follow-up with Neurology at [facility] next month.  Denies headache, dizziness, falls, palpitations, shortness of breath.   R eye vision abnormality Patient reports blurry vision from his right eye which began this past year.  Wears reading glasses.  He states he has not seen an eye doctor in several years and feels he may need a different pair.  Requesting ophthalmology referral.   L shoulder pain Patient states shoulder pain began during last hospital admission.  Painful to lay on at night and he has a hard time finding a comfortable position. Pain is not worse with movement.  He reports a fall when he initially had seizures.  ROM is normal and he is able to use the arm as normal.  No numbness or tingling.  He denies weakness.  Has has been trying not to take Naproxen and is using Tylenol instead.  He has also tried a heating pad.    ROS: Denies fevers, chills, dizziness, palpitations, shortness of breath.  +Blurry vision.   [place]: Pt is a never  smoker.  Medications reviewed. Objective:   BP (!) 164/90   Pulse 75   Temp 97.9 F (36.6 C) (Oral)   Wt 296 lb (134.3 kg)   SpO2 98%   BMI 45.01 kg/m  Vitals and nursing note reviewed.  General: 52 yo AA male, NAD  HEENT: NCAT, EOMI, PERRL, R eyelid with slight droop, no surrounding tenderness, warmth or erythema, MMM, O/P clear  Neck: supple, ROM is normal  CV: RRR no MRG  Lungs: CTAB, nonlaboured Skin: warm, dry, no rash  Extremities: warm and well perfused, normal tone  MSK-  Left Shoulder: Inspection reveals no abnormalities, atrophy or asymmetry. Palpation is normal with no tenderness over AC joint or bicipital groove. Diffuse tenderness noted over deltoid muscle.  ROM is full in all planes. Rotator cuff strength normal throughout. No signs of impingement with negative Neer and Hawkin's tests, empty can. Speeds and Yergason's tests normal. Sensation is normal.    Assessment & Plan:   Seizure [facility]) Patient is adamant that he be allowed to drive again as this is very important to him from a financial standpoint and he is desperate to go back to work.  He focuses on this during most of our encounter.  I discussed with him that I will review this a physician at [facility] as he will be following up with them in a month.  Has not had any additional seizure activity following hospital discharge, however anticipate he  will need to be seizure-free for at least 6 months prior to driving per Neurology note during hospitalization. He is upset with this news.  We also discussed that while his sugars may have been the etiology of his first admission, his second hospitalization for status epilepticus shortly thereafter is worrisome.  He expressed understanding of this but still wishes to drive. -Continue Keppra 1000 mg BID, advised medication compliance   -Pt has appointment with Neurology as outpatient on 3/12  -Return precautions discussed    Blurry vision,  right eye   Ophthalmology referral placed.   Left shoulder pain Exam unremarkable except for mild tenderness over deltoid muscle.  Began during recent hospitalization.  No weakness, numbness, tingling or radicular signs present.  -Recommend Tylenol prn  -Continue heating pads prn and gentle stretching exercises; may try topical IcyHot to see if that provides some relief  -Rx: Tramadol #20 tabs for pain not relieved by Tylenol   Orders Placed This Encounter  Procedures  . Ambulatory referral to Ophthalmology    Referral Priority:   Routine    Referral Type:   Consultation    Referral Reason:   Specialty Services Required    Requested Specialty:   Ophthalmology    Number of Visits Requested:   1   Meds ordered this encounter  Medications  . hydrALAZINE (APRESOLINE) 10 MG tablet    Sig: Take 2 tablets (20 mg total) by mouth every 8 (eight) hours.    Dispense:  30 tablet    Refill:  0  . carvedilol (COREG) 6.25 MG tablet    Sig: Take 1 tablet (6.25 mg total) by mouth 2 (two) times daily with a meal.    Dispense:  60 tablet    Refill:  0  . traMADol (ULTRAM) 50 MG tablet    Sig: Take 1 tablet (50 mg total) by mouth every 12 (twelve) hours as needed for up to 7 days.    Dispense:  20 tablet    Refill:  0    Lovenia Kim, MD [place], PGY-2 09/03/2017 6:38 PM

## 2017-09-01 ENCOUNTER — Ambulatory Visit (INDEPENDENT_AMBULATORY_CARE_PROVIDER_SITE_OTHER): Payer: 59 | Admitting: Family Medicine

## 2017-09-01 ENCOUNTER — Encounter: Payer: Self-pay | Admitting: Family Medicine

## 2017-09-01 VITALS — BP 127/80 | HR 83 | Temp 98.0°F | Ht 68.0 in | Wt 302.4 lb

## 2017-09-01 DIAGNOSIS — I10 Essential (primary) hypertension: Secondary | ICD-10-CM | POA: Diagnosis not present

## 2017-09-01 DIAGNOSIS — E1165 Type 2 diabetes mellitus with hyperglycemia: Secondary | ICD-10-CM

## 2017-09-01 DIAGNOSIS — R569 Unspecified convulsions: Secondary | ICD-10-CM | POA: Diagnosis not present

## 2017-09-01 NOTE — Progress Notes (Signed)
    Subjective:    Patient ID: Jeremy [name], male    DOB: 06/02/1966, 52 y.o.   MRN: [id number]   CC: follow up DM, HTN, driving restrictions  DM- sugars now 90-140, highest noted 253. Fasting around 120 usually. He denies feeling low. Does endorse lethargy usually after lunch.   His sliding scale now states 1 unit if sugar 100-200. But if he is 132 for instance he will not take a unit. He states his threshold to take a unit would be >150. Wife is concerned patient does not want to take triseba every night. He states that if he takes it his blood sugars will only get lower.  BP- on norvasc and hydral. bp good today. He denies CP, SOB, DOE.  Seizures- frustrated w/ no driving. Very upset that he was told to not drive for 6 months as this is affected his financial wellbeing. He states "some rules will be broken then because I have to go to work". He states he only had a seizure because his sugars were very high. He reports he is taking keppra as prescribed. Wife has been driving him to work which is causing strain.   Smoking status reviewed- non-smoker  Review of Systems- see HPI   Objective:  BP 127/80 (BP Location: Left Arm, Patient Position: Sitting, Cuff Size: Large)   Pulse 83   Temp 98 F (36.7 C) (Oral)   Ht 5\' 8"  (1.727 m)   Wt (!) 302 lb 6.4 oz (137.2 kg)   SpO2 99%   BMI 45.98 kg/m  Vitals and nursing note reviewed  General: morbidly obese man, agitated, in NAD Cardiac: RRR, clear S1 and S2, no murmurs, rubs, or gallops Respiratory: clear to auscultation bilaterally, no increased work of breathing Extremities: no edema or cyanosis. Skin: warm and dry, no rashes noted Neuro: alert and oriented, no focal deficits Psych: mood is angry, affect congruent  Assessment & Plan:    Primary hypertension  Blood pressure well controlled today, at goal <140/90 -continue norvasc and hydralazine -follow up with PCP in 3 months  Diabetes mellitus type 2, uncontrolled  ([place])  Blood sugar log reviewed and numbers are much improved. Some high numbers (353 highest) and lowest 90. Patient has self-modified his sliding scale. Will give 1 unit if CBG 150-200. Explained importance of long acting insulin every night regardless of sugars. Patient indicated understanding. Follow up w/ PCP in 1 month to recheck A1C.  Seizure [facility])  Patient frustrated with diagnosis of seizure disorder. Has not had any more seizures on Keppra. Compliant w/ medication. He wants his 6 month driving restriction lifted. Reviewed chart. 1st seizure episode high sugars present, 2nd admission in status epilepticus sugars were normal. Explained to patient cannot blame seizures on high sugars. Reiterated no driving for 6 months. Patient very agitated. Implied he would not follow restriction. Will fill out form expressing concern he will drive against advice and send to [facility]. Precepted w/ attending.     Return in about 4 weeks (around 09/29/2017) for DM.   Lucila Maine, DO Family Medicine Resident PGY-2

## 2017-09-01 NOTE — Patient Instructions (Addendum)
  Keep up the good work with your diabetes! Continue triseba every night as this is making a huge difference in controlling blood sugars. Continue the sliding scale during the day.   Your blood pressure looks great today as well.  Please follow up with neurology as scheduled.  Per [employer], you cannot drive until you are 6 months seizure free.  If you have questions or concerns please do not hesitate to call at [phone].

## 2017-09-03 ENCOUNTER — Encounter: Payer: Self-pay | Admitting: Family Medicine

## 2017-09-03 DIAGNOSIS — H538 Other visual disturbances: Secondary | ICD-10-CM | POA: Insufficient documentation

## 2017-09-03 NOTE — Assessment & Plan Note (Addendum)
Patient is adamant that he be allowed to drive again as this is very important to him from a financial standpoint and he is desperate to go back to work.  He focuses on this during most of our encounter.  I discussed with him that I will review this a physician at [facility] as he will be following up with them in a month.  Has not had any additional seizure activity following hospital discharge, however anticipate he will need to be seizure-free for at least 6 months prior to driving per Neurology note during hospitalization. He is upset with this news.  We also discussed that while his sugars may have been the etiology of his first admission, his second hospitalization for status epilepticus shortly thereafter is worrisome.  He expressed understanding of this but still wishes to drive. -Continue Keppra 1000 mg BID, advised medication compliance   -Pt has appointment with Neurology as outpatient on 3/12  -Return precautions discussed

## 2017-09-03 NOTE — Assessment & Plan Note (Signed)
  Patient frustrated with diagnosis of seizure disorder. Has not had any more seizures on Keppra. Compliant w/ medication. He wants his 6 month driving restriction lifted. Reviewed chart. 1st seizure episode high sugars present, 2nd admission in status epilepticus sugars were normal. Explained to patient cannot blame seizures on high sugars. Reiterated no driving for 6 months. Patient very agitated. Implied he would not follow restriction. Will fill out form expressing concern he will drive against advice and send to [facility]. Precepted w/ attending.

## 2017-09-03 NOTE — Assessment & Plan Note (Signed)
Ophthalmology referral placed.

## 2017-09-03 NOTE — Assessment & Plan Note (Signed)
  Blood pressure well controlled today, at goal <140/90 -continue norvasc and hydralazine -follow up with PCP in 3 months

## 2017-09-03 NOTE — Assessment & Plan Note (Addendum)
  Blood sugar log reviewed and numbers are much improved. Some high numbers (353 highest) and lowest 90. Patient has self-modified his sliding scale. Will give 1 unit if CBG 150-200. Explained importance of long acting insulin every night regardless of sugars. Patient indicated understanding. Follow up w/ PCP in 1 month to recheck A1C.

## 2017-09-03 NOTE — Assessment & Plan Note (Addendum)
Exam unremarkable except for mild tenderness over deltoid muscle.  Began during recent hospitalization.  No weakness, numbness, tingling or radicular signs present.  -Recommend Tylenol prn  -Continue heating pads prn and gentle stretching exercises; may try topical IcyHot to see if that provides some relief  -Rx: Tramadol #20 tabs for pain not relieved by Tylenol

## 2017-09-05 DIAGNOSIS — N184 Chronic kidney disease, stage 4 (severe): Secondary | ICD-10-CM | POA: Diagnosis not present

## 2017-09-05 DIAGNOSIS — N2581 Secondary hyperparathyroidism of renal origin: Secondary | ICD-10-CM | POA: Diagnosis not present

## 2017-09-05 DIAGNOSIS — I129 Hypertensive chronic kidney disease with stage 1 through stage 4 chronic kidney disease, or unspecified chronic kidney disease: Secondary | ICD-10-CM | POA: Diagnosis not present

## 2017-09-08 ENCOUNTER — Other Ambulatory Visit: Payer: Self-pay | Admitting: *Deleted

## 2017-09-08 MED ORDER — HYDRALAZINE HCL 10 MG PO TABS: 20.0000 mg | ORAL_TABLET | Freq: Three times a day (TID) | ORAL | 3 refills | Status: DC

## 2017-09-12 ENCOUNTER — Telehealth: Payer: Self-pay | Admitting: *Deleted

## 2017-09-12 NOTE — Telephone Encounter (Signed)
Pharmacy requesting BD UF SHORT PEN NEEDLE [id number]. Please advise. Jeris Easterly Kennon Holter, CMA

## 2017-09-15 ENCOUNTER — Other Ambulatory Visit: Payer: Self-pay | Admitting: Family Medicine

## 2017-09-15 MED ORDER — INSULIN PEN NEEDLE 31G X 8 MM MISC: 1.0000 "application " | Freq: Four times a day (QID) | 11 refills | Status: DC

## 2017-09-16 ENCOUNTER — Other Ambulatory Visit: Payer: Self-pay

## 2017-09-16 ENCOUNTER — Ambulatory Visit (INDEPENDENT_AMBULATORY_CARE_PROVIDER_SITE_OTHER): Payer: 59 | Admitting: Family Medicine

## 2017-09-16 ENCOUNTER — Encounter: Payer: Self-pay | Admitting: Family Medicine

## 2017-09-16 VITALS — BP 170/90 | HR 71 | Temp 98.2°F | Ht 68.0 in | Wt 297.0 lb

## 2017-09-16 DIAGNOSIS — R569 Unspecified convulsions: Secondary | ICD-10-CM

## 2017-09-16 DIAGNOSIS — I10 Essential (primary) hypertension: Secondary | ICD-10-CM | POA: Diagnosis not present

## 2017-09-16 DIAGNOSIS — E1165 Type 2 diabetes mellitus with hyperglycemia: Secondary | ICD-10-CM | POA: Diagnosis not present

## 2017-09-16 MED ORDER — GLUCOSE BLOOD VI STRP: ORAL_STRIP | 12 refills | Status: DC

## 2017-09-16 NOTE — Assessment & Plan Note (Addendum)
Chronic.  Poorly controlled.  Currently on multiple medications.  Followed by nephrology.  Suspect uncontrolled hypertension may be the source of his seizures.  Has been without seizures since discharge from hospital over 1 month ago. - Continue amlodipine 10 mg daily, carvedilol 6.25 mg daily, hydralazine 20 mg every 8 hours, aspirin 81 mg daily

## 2017-09-16 NOTE — Assessment & Plan Note (Addendum)
Chronic.  History of poor control contributing to seizure 1 month ago.  CBGs now well controlled with use of sliding scale insulin.  No history of hypoglycemia. - Advised to continue current sliding scale insulin - Given refill for glucose testing strips

## 2017-09-16 NOTE — Progress Notes (Signed)
Subjective   Patient ID: Jeremy [name]    DOB: February 24, 1966, 52 y.o. male   MRN: [id number]  CC: "DMV form"  HPI: [name] is a 52 y.o. male who presents to clinic today for the following:  Seizure: Patient with a history of recurrent seizures requiring hospitalization on 07/2017.  Seizure thought to be related to hyperglycemia and poorly controlled blood pressure.  Patient has scheduled neurology appointment on 09/30/17 for follow-up.  He is without neurological deficits today and states that his vision has improved over the last week her baseline.  Continues to be dependent on his girlfriend to transport him to work due to his restrictions from driving.  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  Diabetes: Glucose levels averaging below 100s with rare hypoglycemia as low as high 60s.  This patient is without high glucose levels.  He checks his glucose regularly.  Hypertension: Patient states he has been compliant with his antihypertensives including his hydralazine.  He did not skip his medications this morning.  ROS: see HPI for pertinent.  [place]: NIDDM complicated by CKDIII and macular edema, ED, HTN, MDD, morbid obesity. Surgical history I&D extremity. Family history DM, heart disease. Smoking status reviewed. Medications reviewed.  Objective   BP (!) 170/90 (BP Location: Right Arm, Patient Position: Sitting, Cuff Size: Large)   Pulse 71   Temp 98.2 F (36.8 C) (Oral)   Ht 5\' 8"  (1.727 m)   Wt 297 lb (134.7 kg)   SpO2 98%   BMI 45.16 kg/m  Vitals and nursing note reviewed.  General: obese, NAD with non-toxic appearance HEENT: normocephalic, atraumatic, moist mucous membranes, PERRLA, EOMI Cardiovascular: regular rate and rhythm without murmurs, rubs, or gallops Lungs: clear to auscultation bilaterally with normal work of breathing Abdomen: soft, non-tender, non-distended, normoactive bowel sounds Skin: warm, dry, no  rashes or lesions, cap refill < 2 seconds Extremities: warm and well perfused, normal tone, no edema Neuro: CNII-XII intact with exception to chronic left ptosis, no dysarthria, fine motor intact, stable gait  Assessment & Plan   Diabetes mellitus type 2, uncontrolled (HCC) Chronic.  History of poor control contributing to seizure 1 month ago.  CBGs now well controlled with use of sliding scale insulin.  No history of hypoglycemia. - Advised to continue current sliding scale insulin - Given refill for glucose testing strips  Seizure (HCC) Subacute.  Now resolved.  No residual deficits.  Thought to be related to uncontrolled hypertension.  May have been a component of uncontrolled diabetes contributing to event.  Scheduled follow-up with neurology in 2 weeks.  Has been compliant with not driving until cleared by neurology. - Advised patient to follow-up with neurology on 09/30/17 who will determine if patient is cleared to drive again - Plan for aggressive control of diabetes and hypertension - Continue aspirin 81 mg daily, Lipitor 40 mg daily, Keppra 1000 mg twice daily  Primary hypertension Chronic.  Poorly controlled.  Currently on multiple medications.  Followed by nephrology.  Suspect uncontrolled hypertension may be the source of his seizures.  Has been without seizures since discharge from hospital over 1 month ago. - Continue amlodipine 10 mg daily, carvedilol 6.25 mg daily, hydralazine 20 mg every 8 hours, aspirin 81 mg daily  No orders of the defined types were placed in this encounter.  Meds ordered this encounter  Medications  . glucose blood (ONETOUCH VERIO) test strip    Sig: Use  as instructed    Dispense:  100 each    Refill:  [place], [place], PGY-2 09/16/2017, 12:25 PM

## 2017-09-16 NOTE — Assessment & Plan Note (Addendum)
Subacute.  Now resolved.  No residual deficits.  Thought to be related to uncontrolled hypertension.  May have been a component of uncontrolled diabetes contributing to event.  Scheduled follow-up with neurology in 2 weeks.  Has been compliant with not driving until cleared by neurology. - Advised patient to follow-up with neurology on 09/30/17 who will determine if patient is cleared to drive again - Plan for aggressive control of diabetes and hypertension - Continue aspirin 81 mg daily, Lipitor 40 mg daily, Keppra 1000 mg twice daily

## 2017-09-16 NOTE — Patient Instructions (Signed)
Thank you for coming in to see [place] today. Please see below to review our plan for today's visit.  1.  Follow-up with your neurologist appointment.  They will need to be the ones to clear you to drive again. 2.  I sent in a refill of your diabetic strips.  Should also have lancets available which I sent in electronically yesterday.  Continue checking glucose levels in the morning before breakfast and after meals these recorded. 3.  Continue your current high blood pressure medications.  Follow-up with your kidney specialist.  Please call the clinic at [phone] if your symptoms worsen or you have any concerns. It was our pleasure to serve you.  Harriet [place], [place], PGY-2

## 2017-09-19 ENCOUNTER — Institutional Professional Consult (permissible substitution): Payer: 59 | Admitting: Pulmonary Disease

## 2017-09-23 DIAGNOSIS — E113313 Type 2 diabetes mellitus with moderate nonproliferative diabetic retinopathy with macular edema, bilateral: Secondary | ICD-10-CM | POA: Diagnosis not present

## 2017-09-30 ENCOUNTER — Encounter: Payer: Self-pay | Admitting: Neurology

## 2017-09-30 ENCOUNTER — Other Ambulatory Visit: Payer: Self-pay

## 2017-09-30 ENCOUNTER — Ambulatory Visit: Payer: 59 | Admitting: Neurology

## 2017-09-30 VITALS — BP 197/100 | HR 70 | Resp 16 | Ht 68.0 in | Wt 287.0 lb

## 2017-09-30 DIAGNOSIS — I10 Essential (primary) hypertension: Secondary | ICD-10-CM

## 2017-09-30 DIAGNOSIS — R569 Unspecified convulsions: Secondary | ICD-10-CM

## 2017-09-30 DIAGNOSIS — E669 Obesity, unspecified: Secondary | ICD-10-CM | POA: Diagnosis not present

## 2017-09-30 DIAGNOSIS — E1169 Type 2 diabetes mellitus with other specified complication: Secondary | ICD-10-CM | POA: Diagnosis not present

## 2017-09-30 MED ORDER — LEVETIRACETAM 1000 MG PO TABS: 1000.0000 mg | ORAL_TABLET | Freq: Two times a day (BID) | ORAL | 11 refills | Status: DC

## 2017-09-30 NOTE — Progress Notes (Addendum)
[facility]  PATIENT: Jeremy [name] DOB: [birth year]  REFERRING DOCTOR OR PCP:  Harriet Butte SOURCE: patient, notes from Dr. Yisroel Ramming, notes form ED/Hospital, imaging reports, MRI images on PACS  _________________________________  HISTORICAL  CHIEF COMPLAINT:  Chief Complaint  Patient presents with  . Seizures    Sts. he was hospitalized with sz. activity secondary to high blood sugars, due to noncompliance with dm meds.  Sts. since restarting insulin, he has not had any more seizures. Sts. last sz. was 08/09/17/fim    HISTORY OF PRESENT ILLNESS:  I had the pleasure seeing you patient, Jeremy [name], at [facility] for neurologic consultation regarding his seizures.  He is a 52 year old man who was admitted to the hospital 08/04/2017 with hyperglycemia and hypertension.   His wife felt he had seizures at home that day.  He was given a Keppra loading dose but not continued on the medication as the jerking was felt due to hyperglyucemia and not seizure as he did not lose consciousness.   He went home 08/08/2017.   He was re-admitted to the hospital 08/09/2017 in status epilepticus.   His wife notes the seizures were occurring back to back.   He was placed on Fosphenytoin and Keppra to get the seizures under control.   He was discharged on Keppra 1000 mg po bid and has not had any more seizures since that day.   Last July his DM was doing better and his diabetes medication were discontinued.    He is now on insulin.Since discharge, his sugars have been mostly 80-120 with the highest being 155 once.     He has no history of significant head injury or meningitis, encephalitis or other CNS process. No history of cerebral palsy.  He used to sleep very well but the last 2 months is waking up more and sometimes having trouble falling back asleep.    His wife notes he snores less than he did when he was heavier.        She does not note any pauses in his  breathing or any gasping or snorting. He does not note much sleepiness.      He has been under more stress being unable to drive and losing his brother. He notes mild depression but no anxiety.  I personally reviewed the MRI of the brain dated 08/10/2017. In the brain there is mild chronic microvascular ischemic changes but no acute findings. Incidental note is made of a cystic focus in the left pituitary gland.  REVIEW OF SYSTEMS: Constitutional: No fevers, chills, sweats, or change in appetite.   Some insomnia Eyes: No visual changes, double vision, eye pain Ear, nose and throat: No hearing loss, ear pain, nasal congestion, sore throat Cardiovascular: No chest pain, palpitations Respiratory: No shortness of breath at rest or with exertion.   No wheezes.   Somesnoring GastrointestinaI: No nausea, vomiting, diarrhea, abdominal pain, fecal incontinence Genitourinary: No dysuria, urinary retention or frequency.  No nocturia. Musculoskeletal: No neck pain, back pain Integumentary: No rash, pruritus, skin lesions Neurological: as above Psychiatric: see abpve Endocrine: No palpitations, diaphoresis, change in appetite, change in weigh or increased thirst Hematologic/Lymphatic: No anemia, purpura, petechiae. Allergic/Immunologic: No itchy/runny eyes, nasal congestion, recent allergic reactions, rashes  ALLERGIES: No Known Allergies  HOME MEDICATIONS:  Current Outpatient Medications:  .  amLODipine (NORVASC) 10 MG tablet, Take 1 tablet (10 mg total) by mouth daily., Disp: 90 tablet, Rfl: 3 .  aspirin EC 81 MG tablet, Take 81  mg by mouth daily., Disp: , Rfl:  .  atorvastatin (LIPITOR) 40 MG tablet, Take 1 tablet (40 mg total) by mouth daily., Disp: 90 tablet, Rfl: 3 .  carvedilol (COREG) 6.25 MG tablet, Take 1 tablet (6.25 mg total) by mouth 2 (two) times daily with a meal., Disp: 60 tablet, Rfl: 0 .  glucose blood (ONETOUCH VERIO) test strip, Use as instructed, Disp: 100 each, Rfl: 12 .   hydrALAZINE (APRESOLINE) 10 MG tablet, Take 2 tablets (20 mg total) by mouth every 8 (eight) hours., Disp: 90 tablet, Rfl: 3 .  insulin lispro (HUMALOG) 100 UNIT/ML injection, Inject 0-5 Units into the skin at bedtime. cbg < 100 0 Units cbg 100-200 1 Units cbg 200-300 2 units cbg 3001-400 3 Units cbg 4001-500 5 Units, Disp: 10 mL, Rfl: 0 .  insulin lispro (HUMALOG) 100 UNIT/ML injection, Inject 0-9 Units into the skin 3 (three) times daily with meals. cbg < 70 0 Units cbg 121-150 1 Units cbg 151-200 2 units cbg 201-250 3 Units cbg 251-300 5 Units cbg 301-350 7 Units cbg 351-400 9 Units, Disp: 10 mL, Rfl: 3 .  Insulin Pen Needle (B-D ULTRAFINE III SHORT PEN) 31G X 8 MM MISC, 1 application by Does not apply route 4 (four) times daily., Disp: 100 each, Rfl: 11 .  levETIRAcetam (KEPPRA) 1000 MG tablet, Take 1 tablet (1,000 mg total) by mouth 2 (two) times daily., Disp: 60 tablet, Rfl: 11  PAST MEDICAL HISTORY: Past Medical History:  Diagnosis Date  . Abscess   . AKI (acute kidney injury) ([place]) 08/05/2017  . Bell's palsy   . CKD (chronic kidney disease)    Dr. McDiarmind   . High cholesterol   . Hypertension   . Left shoulder pain 10/26/2013   S/p injection on 10/26/13   . Renal insufficiency 02/14/2014  . Seizures ([place]) 08/04/2017   "related to high blood sugars" (08/05/2017)  . Type II diabetes mellitus ([place])     PAST SURGICAL HISTORY: Past Surgical History:  Procedure Laterality Date  . I&D EXTREMITY  11/11/2011   Procedure: IRRIGATION AND DEBRIDEMENT EXTREMITY;  Surgeon: Merrie Roof, MD;  Location: [place];  Service: General;  Laterality: Right;  I&D Right Thigh Abscess    FAMILY HISTORY: Family History  Problem Relation Age of Onset  . Diabetes Mother   . Heart disease Mother 49  . Diabetes Sister   . Diabetes Brother   . Diabetes Brother   . Stroke Neg Hx   . Cancer Neg Hx     SOCIAL HISTORY:  Social History   Socioeconomic History  . Marital status: Divorced    Spouse  name: Not on file  . Number of children: Not on file  . Years of education: Not on file  . Highest education level: Not on file  Social Needs  . Financial resource strain: Not on file  . Food insecurity - worry: Not on file  . Food insecurity - inability: Not on file  . Transportation needs - medical: Not on file  . Transportation needs - non-medical: Not on file  Occupational History  . Not on file  Tobacco Use  . Smoking status: Never Smoker  . Smokeless tobacco: Never Used  Substance and Sexual Activity  . Alcohol use: No    Alcohol/week: 0.0 oz  . Drug use: No  . Sexual activity: Yes  Other Topics Concern  . 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  Did not graduate high school.  Has 3 adult children in [place].  Divorced.  Lives with girlfriend [name].                     PHYSICAL EXAM  Vitals:   09/30/17 1020  BP: (!) 197/100  Pulse: 70  Resp: 16  Weight: 287 lb (130.2 kg)  Height: 5\' 8"  (1.727 m)    REPEAT BP WAS 155/85  Body mass index is 43.64 kg/m.   General: The patient is well-developed and well-nourished and in no acute distress.   Pharynx is Mallampati 4  Eyes:  Funduscopic exam shows normal optic discs and retinal vessels.  Neck: The neck is supple, no carotid bruits are noted.  The neck is nontender.  Cardiovascular: The heart has a regular rate and rhythm with a normal S1 and S2. There were no murmurs, gallops or rubs. Lungs are clear to auscultation.  Skin: Extremities are without significant edema.  Musculoskeletal:  Back is nontender  Neurologic Exam  Mental status: The patient is alert and oriented x 3 at the time of the examination. The patient has apparent normal recent and remote memory, with an apparently normal attention span and concentration ability.   Speech is normal.  Cranial nerves: Extraocular movements are full. Pupils are equal, round, and  reactive to light and accomodation.  Visual fields are full.  Facial symmetry is present. There is good facial sensation to soft touch bilaterally.Facial strength is normal.  Trapezius and sternocleidomastoid strength is normal. No dysarthria is noted.  The tongue is midline, and the patient has symmetric elevation of the soft palate. No obvious hearing deficits are noted.  Motor:  Muscle bulk is normal.   Tone is normal. Strength is  5 / 5 in all 4 extremities.   Sensory: Sensory testing is intact to pinprick, soft touch and vibration sensation in all 4 extremities.  Coordination: Cerebellar testing reveals good finger-nose-finger and heel-to-shin bilaterally.  Gait and station: Station is normal.   Gait is normal. Tandem gait is normal. Romberg is negative.   Reflexes: Deep tendon reflexes are symmetric and normal bilaterally.   Plantar responses are flexor.    DIAGNOSTIC DATA (LABS, IMAGING, TESTING) - I reviewed patient records, labs, notes, testing and imaging myself where available.  Lab Results  Component Value Date   WBC 10.1 08/10/2017   HGB 11.1 (L) 08/10/2017   HCT 33.8 (L) 08/10/2017   MCV 77.3 (L) 08/10/2017   PLT 270 08/10/2017      Component Value Date/Time   NA 144 08/14/2017 1450   K 5.1 08/14/2017 1450   CL 107 (H) 08/14/2017 1450   CO2 21 08/14/2017 1450   GLUCOSE 180 (H) 08/14/2017 1450   GLUCOSE 137 (H) 08/10/2017 0346   BUN 33 (H) 08/14/2017 1450   CREATININE 2.96 (H) 08/14/2017 1450   CREATININE 2.77 (H) 05/31/2016 0844   CALCIUM 8.9 08/14/2017 1450   PROT 7.2 08/09/2017 2028   PROT 7.2 04/17/2017 1640   ALBUMIN 3.3 (L) 08/09/2017 2028   ALBUMIN 4.0 04/17/2017 1640   AST 43 (H) 08/09/2017 2028   ALT 25 08/09/2017 2028   ALKPHOS 104 08/09/2017 2028   BILITOT 0.7 08/09/2017 2028   BILITOT 0.3 04/17/2017 1640   GFRNONAA 24 (L) 08/14/2017 1450   GFRNONAA 26 (L) 05/31/2016 0844   GFRAA 27 (L) 08/14/2017 1450   GFRAA 30 (L) 05/31/2016 0844   Lab  Results  Component  Value Date   CHOL 203 (H) 02/11/2017   HDL 54 02/11/2017   LDLCALC 134 (H) 02/11/2017   TRIG 76 02/11/2017   CHOLHDL 3.8 02/11/2017   Lab Results  Component Value Date   HGBA1C >15.5 (H) 08/05/2017   Lab Results  Component Value Date   VITAMINB12 412 11/26/2013   Lab Results  Component Value Date   TSH 1.556 05/18/2013       ASSESSMENT AND PLAN  Seizure ([place])  Diabetes mellitus type 2 in obese [facility])  Primary hypertension   In summary, Mr. Isadore is a 52 year old man who had definite seizures on 08/09/2017 and probably had seizures a few days earlier he has had no further seizures started on Keppra 1000 mg by mouth twice a day. I renewed his medications. We had a conversation about driving. Her is that he needs to be seizure free for 6 months. Therefore, he will be allowed to drive around 05/20/1313. I filled out the [facility] paperwork clearly stating the date of the last seizure. His EEG after his seizures was normal and hopefully he will not have any more seizures.  He is advised to call if he has a recurrence of seizure activity or other neurologic issues Thank you for asking me to see Mr. Winemiller. Please let me know if I can be of assistance with him or other patients in the future.   Richard A. Felecia Shelling, MD, [facility] 3/88/8757, 97:28 PM Certified in Neurology, Clinical Neurophysiology, Sleep Medicine, Pain Medicine and Neuroimaging  [facility] [address] [place], [place] [address] [phone]

## 2017-10-01 ENCOUNTER — Telehealth: Payer: Self-pay | Admitting: Family Medicine

## 2017-10-01 NOTE — Telephone Encounter (Signed)
Pt came in office and dropped a form for his Drivers License that needs to be fill out and sign by MD. Last DOS 09-16-17. Best phone # to call is [phone]. Please fax the form to [facility] [phone], and make a copy for the patient to pick up here in office. Form was placed in Red team folder.

## 2017-10-02 NOTE — Telephone Encounter (Signed)
Pt called to check on status of this form. He says he needs it asap. Please let him know when it is ready to pick up.

## 2017-10-02 NOTE — Telephone Encounter (Signed)
Placed in MDs box to be filled out. Kenrick Pore, CMA  

## 2017-10-03 ENCOUNTER — Other Ambulatory Visit: Payer: Self-pay | Admitting: Family Medicine

## 2017-10-03 NOTE — Telephone Encounter (Signed)
Pt called again about DMV forms.  He needs to pick them up today.  Please call when the forms are ready to be picked up

## 2017-10-03 NOTE — Telephone Encounter (Signed)
Pt called again about his DMV forms. He HAS TO HAVE them TODAY.  Please call when they are ready for pickup

## 2017-10-06 DIAGNOSIS — E113413 Type 2 diabetes mellitus with severe nonproliferative diabetic retinopathy with macular edema, bilateral: Secondary | ICD-10-CM | POA: Diagnosis not present

## 2017-10-12 ENCOUNTER — Other Ambulatory Visit: Payer: Self-pay | Admitting: Family Medicine

## 2017-10-14 ENCOUNTER — Other Ambulatory Visit: Payer: Self-pay | Admitting: Family Medicine

## 2017-10-14 NOTE — Telephone Encounter (Signed)
Received request for [place]. Please call and ask patient to schedule an appointment to be seen for diabetes. Notes from his last office visit indicate that his blood sugars were controlled with sliding scale insulin, and he even had some low sugars with sliding scale at that time.   It would be appropriate for him to have an office visit to ensure proper management of blood sugars.  Thank you, Everrett Coombe

## 2017-10-16 NOTE — Telephone Encounter (Signed)
Pt has appt on 10/20/17 with McMullen. Fleeger, Salome Spotted, CMA

## 2017-10-20 ENCOUNTER — Other Ambulatory Visit: Payer: Self-pay

## 2017-10-20 ENCOUNTER — Ambulatory Visit (INDEPENDENT_AMBULATORY_CARE_PROVIDER_SITE_OTHER): Payer: 59 | Admitting: Family Medicine

## 2017-10-20 VITALS — BP 168/86 | HR 81 | Temp 98.4°F | Wt 299.8 lb

## 2017-10-20 DIAGNOSIS — R600 Localized edema: Secondary | ICD-10-CM | POA: Diagnosis not present

## 2017-10-20 DIAGNOSIS — I10 Essential (primary) hypertension: Secondary | ICD-10-CM | POA: Diagnosis not present

## 2017-10-20 DIAGNOSIS — E1165 Type 2 diabetes mellitus with hyperglycemia: Secondary | ICD-10-CM | POA: Diagnosis not present

## 2017-10-20 LAB — POCT GLYCOSYLATED HEMOGLOBIN (HGB A1C): HEMOGLOBIN A1C: 8.1

## 2017-10-20 MED ORDER — SEMAGLUTIDE(0.25 OR 0.5MG/DOS) 2 MG/1.5ML ~~LOC~~ SOPN: PEN_INJECTOR | SUBCUTANEOUS | 11 refills | Status: DC

## 2017-10-20 NOTE — Assessment & Plan Note (Addendum)
Chronic.  Improved.  A1c 8.1 today.  Currently on short acting insulin only.  Good candidate for GLP-1 agonist given CV comorbidities and obesity.  Does not seem to be experiencing hypoglycemia but does have good understanding of how to treat. - Instructed to continue Humalog sliding scale and continue checking fasting and postprandial CBGs - Initiating semaglutide injections - Reviewed return precautions - RTC 2 weeks

## 2017-10-20 NOTE — Assessment & Plan Note (Addendum)
Chronic.  Likely due to CKD.  Seems to be improving with use of Lasix per nephrology. - Advised patient to continue Lasix 80 mg twice daily - Patient to instructed follow-up with nephrology

## 2017-10-20 NOTE — Patient Instructions (Addendum)
Thank you for coming in to see [place] today. Please see below to review our plan for today's visit.  1.  I have started you on a new medication called Semaglitude (Ozempic).  This is a once weekly injection.  You will inject 0.25 mg weekly for the first 4 weeks and then increase to 0.5 mg weekly.  Would like to see you again in 2 weeks to reassess your diabetes.  Bring all of your medications and pens along with your glucometer at your next visit. 2.  Continue taking the Lasix and follow-up with your nephrologist. 3.  Your blood pressure is improved but not quite at optimal control.  We will check your kidney function today and I will call you in regards to her plan to better control your blood pressure.  Check your blood pressure periodically throughout the week and record these and bring them to your next visit in 2 weeks.  Please call the clinic at [phone] if your symptoms worsen or you have any concerns. It was our pleasure to serve you.  Harriet Butte, [place], PGY-2

## 2017-10-20 NOTE — Assessment & Plan Note (Addendum)
Chronic.  BP 168/86.  Suboptimal control though significantly better than historical levels.  Currently on multiple antihypertensives including CCB, BB, hydralazine, loop diuretic.  Non-smoker.  Does have known CKD currently followed by nephrology.  Suspect this is the source of his HTN.  On daily aspirin and moderate statin therapy.  Would likely benefit from ACE inhibitor or ARB based on CKD if kidney function permits.  Other options include increase level of carvedilol given stable HR. - Checking CMET and microalbumin/creatinine urine ratio - Advised to avoid greater than 2000 mg of sodium per day - Patient instructed to follow-up with nephrology - Continue amlodipine 10 mg daily, carvedilol 6.25 mg twice daily, hydralazine 20 mg every 8 hours, and Lasix 80 mg twice daily

## 2017-10-20 NOTE — Progress Notes (Signed)
Subjective   Patient ID: Jeremy [name]    DOB: Dec 08, 1965, 52 y.o. male   MRN: [id number]  CC: "Checkup"  HPI: Jeremy [name] is a 52 y.o. male who presents to clinic today for the following:  Diabetes: Patient has long-standing diabetes previously on insulin and discontinued following controlled A1c but subsequently began having profound hyperglycemia and noted to have a recent stroke.  As of more recently, patient states his CBGs have been much better while on sliding scale insulin only.  He does not have his glucometer today but endorses fasting 86-111, postprandial 119-138, no highs, no lows.  He is trying to avoid excessive carbohydrate intake but does not exercise regularly.  Lower extremity edema: Patient endorses improvement since starting Lasix last month by the nephrologist.  He is tolerating the Lasix well and endorses minimal edema bilaterally with some worsening by the end of the day.  He denies any chest pain but does have some shortness of breath on exertion which has been chronic for him.  Hypertension: Patient tolerating antihypertensive regimen.  He endorses adherence to medications.  He is a non-smoker.  ROS: see HPI for pertinent.  [place]: NIDDM complicated by CKDIII and macular edema, ED, HTN, MDD, morbid obesity. Surgical history I&D extremity. Family history DM, heart disease. Smoking status reviewed. Medications reviewed.  Objective   BP (!) 168/86   Pulse 81   Temp 98.4 F (36.9 C) (Oral)   Wt 299 lb 12.8 oz (136 kg)   SpO2 91%   BMI 45.58 kg/m  Vitals and nursing note reviewed.  General: morbidly obese, well nourished, well developed, NAD with non-toxic appearance HEENT: normocephalic, atraumatic, moist mucous membranes Neck: supple, non-tender without lymphadenopathy, no JVD Cardiovascular: regular rate and rhythm without murmurs, rubs, or gallops Lungs: clear to auscultation bilaterally with normal work of breathing Skin: warm, dry, no rashes or lesions,  cap refill < 2 seconds Extremities: warm and well perfused, normal tone, trace edema bilaterally up to mid-shin, 2+ dorsal pedal pulse bilaterally, diabetic foot exam performed without signs of callus or paresthesia  Assessment & Plan   Bilateral lower extremity edema Chronic.  Likely due to CKD.  Seems to be improving with use of Lasix per nephrology. - Advised patient to continue Lasix 80 mg twice daily - Patient to instructed follow-up with nephrology  Diabetes mellitus type 2, uncontrolled ([place]) Chronic.  Improved.  A1c 8.1 today.  Currently on short acting insulin only.  Good candidate for GLP-1 agonist given CV comorbidities and obesity.  Does not seem to be experiencing hypoglycemia but does have good understanding of how to treat. - Instructed to continue Humalog sliding scale and continue checking fasting and postprandial CBGs - Initiating semaglutide injections - Reviewed return precautions - RTC 2 weeks  Primary hypertension Chronic.  BP 168/86.  Suboptimal control though significantly better than historical levels.  Currently on multiple antihypertensives including CCB, BB, hydralazine, loop diuretic.  Non-smoker.  Does have known CKD currently followed by nephrology.  Suspect this is the source of his HTN.  On daily aspirin and moderate statin therapy.  Would likely benefit from ACE inhibitor or ARB based on CKD if kidney function permits.  Other options include increase level of carvedilol given stable HR. - Checking CMET and microalbumin/creatinine urine ratio - Advised to avoid greater than 2000 mg of sodium per day - Patient instructed to follow-up with nephrology - Continue amlodipine 10 mg daily, carvedilol 6.25 mg twice daily, hydralazine 20 mg every 8 hours, and  Lasix 80 mg twice daily  Orders Placed This Encounter  Procedures  . CMP14+EGFR  . Microalbumin / creatinine urine ratio  . HgB A1c   Meds ordered this encounter  Medications  . Semaglutide (OZEMPIC) 0.25  or 0.5 MG/DOSE SOPN    Sig: Inject 0.25 mg into the skin once a week for 28 days, THEN 0.5 mg once a week.    Dispense:  10 pen    Refill:  [place], [place], PGY-2 10/21/2017, 12:05 PM

## 2017-10-21 ENCOUNTER — Telehealth: Payer: Self-pay

## 2017-10-21 ENCOUNTER — Encounter: Payer: Self-pay | Admitting: Family Medicine

## 2017-10-21 LAB — CMP14+EGFR
A/G RATIO: 1.1 — AB (ref 1.2–2.2)
ALBUMIN: 3.9 g/dL (ref 3.5–5.5)
ALK PHOS: 90 IU/L (ref 39–117)
ALT: 15 IU/L (ref 0–44)
AST: 17 IU/L (ref 0–40)
BILIRUBIN TOTAL: 0.3 mg/dL (ref 0.0–1.2)
BUN / CREAT RATIO: 11 (ref 9–20)
BUN: 35 mg/dL — AB (ref 6–24)
CHLORIDE: 105 mmol/L (ref 96–106)
CO2: 22 mmol/L (ref 20–29)
Calcium: 9.1 mg/dL (ref 8.7–10.2)
Creatinine, Ser: 3.12 mg/dL — ABNORMAL HIGH (ref 0.76–1.27)
GFR calc Af Amer: 26 mL/min/{1.73_m2} — ABNORMAL LOW (ref >59)
GFR calc non Af Amer: 22 mL/min/{1.73_m2} — ABNORMAL LOW (ref >59)
GLOBULIN, TOTAL: 3.4 g/dL (ref 1.5–4.5)
Glucose: 97 mg/dL (ref 65–99)
Potassium: 4.6 mmol/L (ref 3.5–5.2)
SODIUM: 144 mmol/L (ref 134–144)
TOTAL PROTEIN: 7.3 g/dL (ref 6.0–8.5)

## 2017-10-21 LAB — MICROALBUMIN / CREATININE URINE RATIO
Creatinine, Urine: 66.8 mg/dL
Microalb/Creat Ratio: 223.4 mg/g creat — ABNORMAL HIGH (ref 0.0–30.0)
Microalbumin, Urine: 149.2 ug/mL

## 2017-10-21 NOTE — Telephone Encounter (Signed)
Discussed with patient not starting [place] at this time.  We will continue sliding scale insulin as needed along with new prescription for semaglutide.

## 2017-10-21 NOTE — Telephone Encounter (Signed)
Fax request for Tresiba flexpen. Not on current med list. Danley Danker, RN [facility] RN)

## 2017-10-27 DIAGNOSIS — E113412 Type 2 diabetes mellitus with severe nonproliferative diabetic retinopathy with macular edema, left eye: Secondary | ICD-10-CM | POA: Diagnosis not present

## 2017-10-29 ENCOUNTER — Telehealth: Payer: Self-pay

## 2017-10-29 NOTE — Telephone Encounter (Signed)
Patient called and asked if he still needed to come to appt tomorrow with PCP because he has not started his new medication prescribed and appt was to check progress.   Reviewed chart. Patient has not started Ozempic. Patient misunderstood instructions and thought Ozempic was to REPLACE Humalog. Clarified with patient that Ozempic was to be in ADDITION to Humalog and told him to take first dose today and set a reminder on his phone to take once weekly.  Does patient need to come tomorrow or should he reschedule appt for two more weeks?  Call back is [phone].  Danley Danker, RN [facility] RN)

## 2017-10-29 NOTE — Telephone Encounter (Signed)
Please still come to appointment. Sent a note to Dr. Ola Spurr requesting he be started on a new BP med (ACE-I or ARB). Please call and let him know to come in. Thanks.  Harriet Butte, [place], PGY-2

## 2017-10-30 ENCOUNTER — Ambulatory Visit: Payer: 59 | Admitting: Internal Medicine

## 2017-10-30 NOTE — Telephone Encounter (Signed)
Unable to reach pt before appt. Please contact pt about plans to start new medication before next appt with you. Patrick Sohm Kennon Holter, CMA

## 2017-10-31 ENCOUNTER — Other Ambulatory Visit: Payer: Self-pay | Admitting: Family Medicine

## 2017-10-31 ENCOUNTER — Telehealth: Payer: Self-pay | Admitting: Family Medicine

## 2017-10-31 DIAGNOSIS — I10 Essential (primary) hypertension: Secondary | ICD-10-CM

## 2017-10-31 MED ORDER — ENALAPRIL MALEATE 5 MG PO TABS: 5.0000 mg | ORAL_TABLET | Freq: Every day | ORAL | 0 refills | Status: DC

## 2017-10-31 NOTE — Telephone Encounter (Signed)
Contacted patient regarding missed appointment on 10/30/17.  Patient states he was trying to contact clinic to reschedule.  He has started and is tolerating Ozempic as instructed but was unsure if he should continue taking his sliding scale.  I instructed patient to continue checking his CBGs and take insulin as needed.    Discussed with patient regarding labs from previous visit with stable kidney function.  Given his poorly controlled BP, will initiate enalapril 5 mg daily.  Prescription sent to pharmacy.  Discussed risks and benefits of medication along with red flags such as angioedema.  Patient scheduled on 11/06/17 with Dr. Ola Spurr to follow-up kidney function and BP control since I have no availability.  Patient without further questions or concerns.  Harriet Butte, [place], PGY-2

## 2017-11-01 ENCOUNTER — Other Ambulatory Visit: Payer: Self-pay | Admitting: Family Medicine

## 2017-11-06 ENCOUNTER — Encounter: Payer: Self-pay | Admitting: Internal Medicine

## 2017-11-06 ENCOUNTER — Ambulatory Visit (INDEPENDENT_AMBULATORY_CARE_PROVIDER_SITE_OTHER): Payer: 59 | Admitting: Internal Medicine

## 2017-11-06 ENCOUNTER — Other Ambulatory Visit: Payer: Self-pay | Admitting: Family Medicine

## 2017-11-06 ENCOUNTER — Other Ambulatory Visit: Payer: Self-pay

## 2017-11-06 VITALS — BP 138/82 | HR 71 | Temp 98.4°F | Ht 68.0 in | Wt 294.0 lb

## 2017-11-06 DIAGNOSIS — I10 Essential (primary) hypertension: Secondary | ICD-10-CM

## 2017-11-06 DIAGNOSIS — Z1211 Encounter for screening for malignant neoplasm of colon: Secondary | ICD-10-CM

## 2017-11-06 DIAGNOSIS — Z5181 Encounter for therapeutic drug level monitoring: Secondary | ICD-10-CM

## 2017-11-06 DIAGNOSIS — N179 Acute kidney failure, unspecified: Secondary | ICD-10-CM | POA: Diagnosis not present

## 2017-11-06 MED ORDER — HYDRALAZINE HCL 10 MG PO TABS: 20.0000 mg | ORAL_TABLET | Freq: Two times a day (BID) | ORAL | 3 refills | Status: DC

## 2017-11-06 MED ORDER — AMLODIPINE BESYLATE 10 MG PO TABS: 10.0000 mg | ORAL_TABLET | Freq: Every day | ORAL | 3 refills | Status: DC

## 2017-11-06 MED ORDER — ASPIRIN EC 81 MG PO TBEC: 81.0000 mg | DELAYED_RELEASE_TABLET | Freq: Every day | ORAL | 3 refills | Status: DC

## 2017-11-06 MED ORDER — GLUCOSE BLOOD VI STRP: ORAL_STRIP | 12 refills | Status: DC

## 2017-11-06 MED ORDER — LEVETIRACETAM 1000 MG PO TABS: 1000.0000 mg | ORAL_TABLET | Freq: Two times a day (BID) | ORAL | 3 refills | Status: DC

## 2017-11-06 MED ORDER — POLYETHYLENE GLYCOL 3350 17 GM/SCOOP PO POWD: ORAL | 0 refills | Status: DC

## 2017-11-06 MED ORDER — FUROSEMIDE 80 MG PO TABS: 80.0000 mg | ORAL_TABLET | Freq: Two times a day (BID) | ORAL | 3 refills | Status: DC

## 2017-11-06 MED ORDER — ENALAPRIL MALEATE 5 MG PO TABS: 5.0000 mg | ORAL_TABLET | Freq: Every day | ORAL | 3 refills | Status: DC

## 2017-11-06 MED ORDER — ATORVASTATIN CALCIUM 40 MG PO TABS: 40.0000 mg | ORAL_TABLET | Freq: Every day | ORAL | 3 refills | Status: DC

## 2017-11-06 NOTE — Patient Instructions (Signed)
Mr. Darden,  I will call with lab results.  Please try 1 tablespoon of miralax daily for constipation.  Please return at the beginning of July for blood sugar check.  Best, Dr. Ola Spurr

## 2017-11-07 LAB — BASIC METABOLIC PANEL
BUN/Creatinine Ratio: 14 (ref 9–20)
BUN: 54 mg/dL — ABNORMAL HIGH (ref 6–24)
CHLORIDE: 102 mmol/L (ref 96–106)
CO2: 23 mmol/L (ref 20–29)
Calcium: 9.3 mg/dL (ref 8.7–10.2)
Creatinine, Ser: 3.74 mg/dL — ABNORMAL HIGH (ref 0.76–1.27)
GFR calc non Af Amer: 18 mL/min/{1.73_m2} — ABNORMAL LOW (ref >59)
GFR, EST AFRICAN AMERICAN: 20 mL/min/{1.73_m2} — AB (ref >59)
Glucose: 94 mg/dL (ref 65–99)
POTASSIUM: 4.7 mmol/L (ref 3.5–5.2)
SODIUM: 140 mmol/L (ref 134–144)

## 2017-11-10 ENCOUNTER — Encounter: Payer: Self-pay | Admitting: Internal Medicine

## 2017-11-10 ENCOUNTER — Other Ambulatory Visit: Payer: 59

## 2017-11-10 MED ORDER — HYDRALAZINE HCL 25 MG PO TABS: 25.0000 mg | ORAL_TABLET | Freq: Three times a day (TID) | ORAL | 1 refills | Status: DC

## 2017-11-10 NOTE — Assessment & Plan Note (Signed)
-   At goal of <140/90 today. Continue coreg 6.25 mg BID, enalapril 5 mg daily, hydralazine 20 mg TID, amlodipine 10 mg daily. Also takes lasix 80 mg daily.  - Will check BMP since recently initiated enalapril - Refilled medications as 90-day supplies to encourage adherence

## 2017-11-10 NOTE — Progress Notes (Signed)
[name] Progress Note  Subjective:  Jeremy [name] is a 52 y.o. male with history of T2DM, combined CHF, seizures, macular edema, CKDIV and obesity who presents for medication follow-up after starting 5 mg daily enalapril earlier this week for proteinuria. He reports concerns of needing 90-day refills of his medications. He also reports taking hydralazine 20 mg BID rather than TID, saying he was instructed he could take it this way. He denies lightheadedness or headache. He brings his medication today and is accompanied by his girlfriend who says she helps him use a pillbox to remember his medications. He reports doing well on ozempic. (CBGs have been <200 but still on initial dose.) ROS: no chest pain, reports constipation  HM: Due for colonoscopy; seeing [facility] for ophthlamology  No Known Allergies  Social History   Tobacco Use  . Smoking status: Never Smoker  . Smokeless tobacco: Never Used  Substance Use Topics  . Alcohol use: No    Alcohol/week: 0.0 oz    Objective: Blood pressure 138/82, pulse 71, temperature 98.4 F (36.9 C), temperature source Oral, height 5\' 8"  (1.727 m), weight 294 lb (133.4 kg), SpO2 95 %. Body mass index is 44.7 kg/m. Constitutional: Obese male, pleasant in NAD HENT: Subconjunctival hemorrhage on left (reports improving after injection) Cardiovascular: RRR, S1, S2, no m/r/g.  Pulmonary/Chest: Effort normal and breath sounds normal.  Psychiatric: Normal mood and affect.  Vitals reviewed  Assessment/Plan: Essential hypertension - At goal of <140/90 today. Continue coreg 6.25 mg BID, enalapril 5 mg daily, hydralazine 20 mg TID, amlodipine 10 mg daily. Also takes lasix 80 mg daily.  - Will check BMP since recently initiated enalapril - Refilled medications as 90-day supplies to encourage adherence  Ordered GI referral for HM colonoscopy.   Follow-up pending labs but in 3 months for A1c check.   Olene Floss,  MD [place], PGY-3

## 2017-11-11 ENCOUNTER — Encounter: Payer: Self-pay | Admitting: Internal Medicine

## 2017-11-11 DIAGNOSIS — E113412 Type 2 diabetes mellitus with severe nonproliferative diabetic retinopathy with macular edema, left eye: Secondary | ICD-10-CM | POA: Diagnosis not present

## 2017-11-11 DIAGNOSIS — H02421 Myogenic ptosis of right eyelid: Secondary | ICD-10-CM | POA: Diagnosis not present

## 2017-11-12 DIAGNOSIS — H02413 Mechanical ptosis of bilateral eyelids: Secondary | ICD-10-CM | POA: Diagnosis not present

## 2017-11-17 ENCOUNTER — Encounter: Payer: Self-pay | Admitting: Family Medicine

## 2017-11-17 ENCOUNTER — Other Ambulatory Visit: Payer: Self-pay

## 2017-11-17 ENCOUNTER — Ambulatory Visit (INDEPENDENT_AMBULATORY_CARE_PROVIDER_SITE_OTHER): Payer: 59 | Admitting: Family Medicine

## 2017-11-17 VITALS — BP 144/76 | HR 73 | Temp 97.8°F | Ht 68.0 in | Wt 293.0 lb

## 2017-11-17 DIAGNOSIS — N184 Chronic kidney disease, stage 4 (severe): Secondary | ICD-10-CM

## 2017-11-17 DIAGNOSIS — I10 Essential (primary) hypertension: Secondary | ICD-10-CM

## 2017-11-17 NOTE — Assessment & Plan Note (Signed)
Chronic.  History of poor control.  BP suboptimal though acceptable. - Advised patient to continue amlodipine 10 mg daily, enalapril 10 mg daily, Lasix 80 mg twice daily, and to increase hydralazine to 10 mg every 8 hours

## 2017-11-17 NOTE — Patient Instructions (Signed)
Thank you for coming in to see Jeremy [name] today. Please see below to review our plan for today's visit.  1.  I would like you to take an additional dose of your hydralazine 10 mg during lunchtime.  I will call you if your kidney function is worse regarding the enalapril. 2.  I would encourage you to check your blood pressure 1-2 times a week to get accurate measurements. 3.  I like to see you again in 3 months to discuss your diabetes.  Please call the clinic at [phone] if your symptoms worsen or you have any concerns. It was our pleasure to serve you.  Harriet Butte, [place], PGY-2

## 2017-11-17 NOTE — Assessment & Plan Note (Signed)
Stable.  Recently started enalapril for improved BP control and kidney protection. - Checking renal function panel

## 2017-11-17 NOTE — Progress Notes (Signed)
   Subjective   Patient ID: Jeremy [name]    DOB: 02-24-1966, 52 y.o. male   MRN: [id number]  CC: " Kidney function"  HPI: Jeremy [name] is a 52 y.o. male who presents to clinic today for the following:  CKD stage IV: Followed by Dr. Florene Glen with [facility].  Recently started on enalapril, approximately 10 days since initiating.  Has been tolerating medication well.  Here today for follow-up with his kidney function.  Patient has been adherent to his antihypertensive regimen.  He is a non-smoker.  He denies any hematuria, dysuria, flank pain, shortness of breath, edema.  Hypertension: Patient endorses adherence to antihypertensive meds.  He continues to take the hydralazine 10 mg but only in the morning and evening, those prescribed every 8 hours.  He does not check his ambulatory blood pressures.  Patient denies chest pain or palpitations.  ROS: see HPI for pertinent.  [place]: NIDDM complicated by CKDIII and macular edema, ED, HTN, MDD, morbid obesity. Surgical history I&D extremity. Family history DM, heart disease. Smoking status reviewed. Medications reviewed.  Objective   BP (!) 144/76   Pulse 73   Temp 97.8 F (36.6 C) (Oral)   Ht 5\' 8"  (1.727 m)   Wt 293 lb (132.9 kg)   SpO2 95%   BMI 44.55 kg/m  Vitals and nursing note reviewed.  General: obese, well developed, NAD with non-toxic appearance HEENT: normocephalic, atraumatic, moist mucous membranes Neck: supple, non-tender without lymphadenopathy Cardiovascular: regular rate and rhythm without murmurs, rubs, or gallops Lungs: clear to auscultation bilaterally with normal work of breathing Abdomen: soft, non-tender, non-distended, normoactive bowel sounds Skin: warm, dry, no rashes or lesions, cap refill < 2 seconds Extremities: warm and well perfused, normal tone, 0/1+ pitting edema up to knees bilaterally  Assessment & Plan   Primary hypertension Chronic.  History of poor control.  BP suboptimal though  acceptable. - Advised patient to continue amlodipine 10 mg daily, enalapril 10 mg daily, Lasix 80 mg twice daily, and to increase hydralazine to 10 mg every 8 hours  Chronic kidney disease (CKD), stage IV (severe) (HCC) Stable.  Recently started enalapril for improved BP control and kidney protection. - Checking renal function panel  Orders Placed This Encounter  Procedures  . Renal Function Panel   No orders of the defined types were placed in this encounter.   Harriet Butte, [place], PGY-2 11/17/2017, 4:54 PM

## 2017-11-18 ENCOUNTER — Encounter: Payer: Self-pay | Admitting: Family Medicine

## 2017-11-18 LAB — RENAL FUNCTION PANEL
Albumin: 4.3 g/dL (ref 3.5–5.5)
BUN / CREAT RATIO: 16 (ref 9–20)
BUN: 59 mg/dL — ABNORMAL HIGH (ref 6–24)
CHLORIDE: 104 mmol/L (ref 96–106)
CO2: 21 mmol/L (ref 20–29)
Calcium: 9.1 mg/dL (ref 8.7–10.2)
Creatinine, Ser: 3.7 mg/dL — ABNORMAL HIGH (ref 0.76–1.27)
GFR calc non Af Amer: 18 mL/min/{1.73_m2} — ABNORMAL LOW (ref >59)
GFR, EST AFRICAN AMERICAN: 21 mL/min/{1.73_m2} — AB (ref >59)
GLUCOSE: 97 mg/dL (ref 65–99)
POTASSIUM: 4.8 mmol/L (ref 3.5–5.2)
Phosphorus: 4.2 mg/dL (ref 2.5–4.5)
SODIUM: 140 mmol/L (ref 134–144)

## 2017-12-01 DIAGNOSIS — H02421 Myogenic ptosis of right eyelid: Secondary | ICD-10-CM | POA: Diagnosis not present

## 2017-12-01 DIAGNOSIS — H02401 Unspecified ptosis of right eyelid: Secondary | ICD-10-CM | POA: Diagnosis not present

## 2017-12-03 DIAGNOSIS — N184 Chronic kidney disease, stage 4 (severe): Secondary | ICD-10-CM | POA: Diagnosis not present

## 2017-12-03 DIAGNOSIS — N2581 Secondary hyperparathyroidism of renal origin: Secondary | ICD-10-CM | POA: Diagnosis not present

## 2017-12-03 DIAGNOSIS — I129 Hypertensive chronic kidney disease with stage 1 through stage 4 chronic kidney disease, or unspecified chronic kidney disease: Secondary | ICD-10-CM | POA: Diagnosis not present

## 2017-12-08 ENCOUNTER — Telehealth: Payer: Self-pay | Admitting: *Deleted

## 2017-12-08 NOTE — Telephone Encounter (Signed)
REFERRAL SENT TO SCHEDULING FROM [facility] DR. Erling Cruz [phone].

## 2017-12-09 DIAGNOSIS — E113412 Type 2 diabetes mellitus with severe nonproliferative diabetic retinopathy with macular edema, left eye: Secondary | ICD-10-CM | POA: Diagnosis not present

## 2017-12-25 ENCOUNTER — Telehealth: Payer: Self-pay | Admitting: Family Medicine

## 2017-12-25 NOTE — Telephone Encounter (Signed)
Forms for DMV need to be filled out and patient would like to speak to PCP.   Marland KitchenOzella Almond, CMA

## 2017-12-25 NOTE — Telephone Encounter (Signed)
Pt has some forms to be filled out and returned to [facility].  Pt is not sure if his visit 11-17-17 to Dr Yisroel Ramming will be good enough to complete the forms. If he makes an appt, he cannot be seen until June 27.  These forms need to be retuned ASAP.  Pt would like to discuss this with dr.

## 2017-12-26 DIAGNOSIS — Z0289 Encounter for other administrative examinations: Secondary | ICD-10-CM

## 2017-12-26 NOTE — Telephone Encounter (Signed)
All the patient.  Scheduled appointment with me next week on the 13th at 3:50 PM.  Patient states he needs forms completed for clearance by DMV to drive again following seizure event.  Patient states he no longer has to see the neurologist who approved him to drive after July.  Will need to contact neurologist, Dr. Villa Herb to get documentation of approval.  Can you reach out to [facility] neurologist to obtain records?  Harriet Butte, [place], PGY-2

## 2017-12-29 NOTE — Telephone Encounter (Signed)
Faxed and requested records from [facility] Neurologist for [facility] clearance.  Jeremy [name], Jeremy [name]

## 2017-12-30 ENCOUNTER — Telehealth: Payer: Self-pay | Admitting: *Deleted

## 2017-12-30 NOTE — Telephone Encounter (Signed)
DMV forms completed and returned to Medical Records/fim

## 2017-12-30 NOTE — Telephone Encounter (Signed)
DMV forms faxed to [phone]

## 2017-12-31 NOTE — Progress Notes (Signed)
   Subjective   Patient ID: Jeremy [name]    DOB: Dec 18, 1965, 52 y.o. male   MRN: [id number]  CC: "Seizure follow-up"  HPI: Jeremy [name] is a 52 y.o. male who presents for a same day appointment for the following:  Seizure: Patient here today for follow-up regarding approval to be cleared to drive again following seizure like activity impression 6 months ago.  Patient has been seen by neurology and has been cleared.  Patient is in the process of getting DMV paperwork completed so that he can begin driving as of February 04, 2018.  Patient states he is back to his baseline and does not have any visual deficits, confusion, loss of motor function, sensory loss, tremor, syncope since hospital discharge.  He is also waiting to get clearance by ophthalmology following this encounter.  ROS: see HPI for pertinent.  [place]: DM complicated by CKDIII and macular edema, ED, HTN, MDD, morbid obesity. Surgical history I&D extremity. Family history DM, heart disease. Smoking status reviewed. Medications reviewed.  Objective   BP (!) 152/84   Pulse 68   Temp 97.8 F (36.6 C) (Oral)   Ht 5\' 8"  (1.727 m)   Wt 286 lb 3.2 oz (129.8 kg)   SpO2 95%   BMI 43.52 kg/m  Vitals and nursing note reviewed.  General: well nourished, well developed, NAD with non-toxic appearance HEENT: normocephalic, atraumatic, moist mucous membranes, PERRLA, EOMI Neck: supple, non-tender without lymphadenopathy Cardiovascular: regular rate and rhythm without murmurs, rubs, or gallops Lungs: clear to auscultation bilaterally with normal work of breathing Abdomen: obese, soft, non-tender, non-distended, normoactive bowel sounds Skin: warm, dry, no rashes or lesions, cap refill < 2 seconds Extremities: warm and well perfused, normal tone, chronic mild pitting edema bilaterally Neuro: CNII-XII intact, fine motor intact, non-tremulous  Assessment & Plan   Seizure (HCC) Chronic.  Controlled.  Instructed to continue Keppra by  Neurology.  No focal deficit on exam. - Continue Keppra 1000 mg twice daily - Paperwork signed and will need to follow with ophthalmology approval along with neurology prior to patient driving again  No orders of the defined types were placed in this encounter.  No orders of the defined types were placed in this encounter.   Harriet Butte, [place], PGY-2 01/01/2018, 5:27 PM

## 2017-12-31 NOTE — Telephone Encounter (Signed)
Have we received Jeremy [name] information regarding neurology approval for driving in July? If not, can we call them today and have this confirmed before his appointment with me tomorrow.  Harriet Butte, [place], PGY-2

## 2018-01-01 ENCOUNTER — Other Ambulatory Visit: Payer: Self-pay

## 2018-01-01 ENCOUNTER — Ambulatory Visit (INDEPENDENT_AMBULATORY_CARE_PROVIDER_SITE_OTHER): Payer: 59 | Admitting: Family Medicine

## 2018-01-01 ENCOUNTER — Encounter: Payer: Self-pay | Admitting: Family Medicine

## 2018-01-01 DIAGNOSIS — R569 Unspecified convulsions: Secondary | ICD-10-CM | POA: Diagnosis not present

## 2018-01-01 NOTE — Assessment & Plan Note (Signed)
Chronic.  Controlled.  Instructed to continue Keppra by Neurology.  No focal deficit on exam. - Continue Keppra 1000 mg twice daily - Paperwork signed and will need to follow with ophthalmology approval along with neurology prior to patient driving again

## 2018-01-01 NOTE — Patient Instructions (Signed)
Thank you for coming in to see [place] today. Please see below to review our plan for today's visit.  I will look over the paperwork and fill out the form you provided me.  Please call the clinic at [phone] if your symptoms worsen or you have any concerns. It was our pleasure to serve you.  Harriet Butte, [place], PGY-2

## 2018-01-06 DIAGNOSIS — E113412 Type 2 diabetes mellitus with severe nonproliferative diabetic retinopathy with macular edema, left eye: Secondary | ICD-10-CM | POA: Diagnosis not present

## 2018-01-06 DIAGNOSIS — E113413 Type 2 diabetes mellitus with severe nonproliferative diabetic retinopathy with macular edema, bilateral: Secondary | ICD-10-CM | POA: Diagnosis not present

## 2018-01-12 ENCOUNTER — Telehealth: Payer: Self-pay

## 2018-01-12 ENCOUNTER — Ambulatory Visit: Payer: 59 | Admitting: Internal Medicine

## 2018-01-12 NOTE — Telephone Encounter (Signed)
Called to try to move patient's appointment earlier in the day (1:15 or 2:00). No answer; no voicemail.

## 2018-01-14 ENCOUNTER — Telehealth: Payer: Self-pay | Admitting: Family Medicine

## 2018-01-14 DIAGNOSIS — I129 Hypertensive chronic kidney disease with stage 1 through stage 4 chronic kidney disease, or unspecified chronic kidney disease: Secondary | ICD-10-CM | POA: Diagnosis not present

## 2018-01-14 DIAGNOSIS — N2581 Secondary hyperparathyroidism of renal origin: Secondary | ICD-10-CM | POA: Diagnosis not present

## 2018-01-14 DIAGNOSIS — N184 Chronic kidney disease, stage 4 (severe): Secondary | ICD-10-CM | POA: Diagnosis not present

## 2018-01-14 NOTE — Telephone Encounter (Signed)
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 Last DOS 01-01-2018. Pt is requesting to fax this form to [phone], pt would like a copy when its ready to be pick up. Best phone # to contact is [phone]. Form was placed in Red team folder.

## 2018-01-15 NOTE — Telephone Encounter (Signed)
Done. Placed in RN box.  Harriet Butte, [place], PGY-2

## 2018-01-15 NOTE — Telephone Encounter (Signed)
Placed in MDs box. Mohammad Granade, CMA  

## 2018-01-16 ENCOUNTER — Ambulatory Visit (INDEPENDENT_AMBULATORY_CARE_PROVIDER_SITE_OTHER): Payer: 59 | Admitting: Family Medicine

## 2018-01-16 DIAGNOSIS — K529 Noninfective gastroenteritis and colitis, unspecified: Secondary | ICD-10-CM | POA: Diagnosis not present

## 2018-01-16 NOTE — Telephone Encounter (Signed)
Patient rescheduled

## 2018-01-16 NOTE — Progress Notes (Signed)
   Subjective   Patient ID: Jeremy [name]    DOB: 23-Aug-1965, 52 y.o. male   MRN: [id number]  CC: "Diarrhea"  HPI: Jeremy [name] is a 52 y.o. male who presents for a same day appointment for the following:  DIARRHEA  Having diarrhea for 8 days Progression: Sudden, improving Stools per day: 10 Does diarrhea wake patient: yes Medications tried: Peptomismol Recent travel: no Sick contacts: no Ingested suspicious foods: had a [place] drink prior Antibiotics recently: no Immunocompromised: no  Symptoms Vomiting: no Abdominal pain: yes, mild now resolved Weight Loss: yes 3-4 lbs Decreased urine output: no Lightheadedness: no Fever: no Bloody stools: no  ROS: see HPI for pertinent.  [place]: DM complicated by CKDIII and macular edema, ED, HTN, MDD, morbid obesity. Surgical history I&D extremity. Family history DM, heart disease. Smoking status reviewed. Medications reviewed.  Objective   BP 130/80   Pulse 83   Temp 98.5 F (36.9 C)   Wt 284 lb (128.8 kg)   SpO2 97%   BMI 43.18 kg/m  Vitals and nursing note reviewed.  General: well nourished, well developed, NAD with non-toxic appearance HEENT: normocephalic, atraumatic, moist mucous membranes Neck: supple, non-tender without lymphadenopathy Cardiovascular: regular rate and rhythm without murmurs, rubs, or gallops Lungs: clear to auscultation bilaterally with normal work of breathing Abdomen: soft, non-tender, non-distended, normoactive bowel sounds Skin: warm, dry, no rashes or lesions, cap refill < 2 seconds Extremities: warm and well perfused, normal tone, chronic mild lower extremity edema  Assessment & Plan   Gastroenteritis Acute.  Improving.  Stools more formed.  Advancing diet as tolerated.  Appears euvolemic. - Discussed importance of adequate hydration and advised slow advancement of soft diet as tolerated - Reviewed return precautions  No orders of the defined types were placed in this encounter.  No  orders of the defined types were placed in this encounter.   Harriet Butte, [place], PGY-2 01/17/2018, 1:14 PM

## 2018-01-17 ENCOUNTER — Encounter: Payer: Self-pay | Admitting: Family Medicine

## 2018-01-17 DIAGNOSIS — K529 Noninfective gastroenteritis and colitis, unspecified: Secondary | ICD-10-CM | POA: Insufficient documentation

## 2018-01-17 NOTE — Assessment & Plan Note (Signed)
Acute.  Improving.  Stools more formed.  Advancing diet as tolerated.  Appears euvolemic. - Discussed importance of adequate hydration and advised slow advancement of soft diet as tolerated - Reviewed return precautions

## 2018-01-28 DIAGNOSIS — H02421 Myogenic ptosis of right eyelid: Secondary | ICD-10-CM | POA: Diagnosis not present

## 2018-01-28 DIAGNOSIS — H02411 Mechanical ptosis of right eyelid: Secondary | ICD-10-CM | POA: Diagnosis not present

## 2018-02-06 ENCOUNTER — Telehealth: Payer: Self-pay | Admitting: Family Medicine

## 2018-02-06 NOTE — Telephone Encounter (Signed)
No VM set up. Please assist in scheduling f/u appt

## 2018-02-23 DIAGNOSIS — E113413 Type 2 diabetes mellitus with severe nonproliferative diabetic retinopathy with macular edema, bilateral: Secondary | ICD-10-CM | POA: Diagnosis not present

## 2018-02-26 ENCOUNTER — Other Ambulatory Visit: Payer: Self-pay | Admitting: *Deleted

## 2018-02-26 ENCOUNTER — Other Ambulatory Visit: Payer: Self-pay | Admitting: Family Medicine

## 2018-02-26 MED ORDER — INSULIN LISPRO 100 UNIT/ML ~~LOC~~ SOLN: SUBCUTANEOUS | 3 refills | Status: DC

## 2018-02-26 NOTE — Telephone Encounter (Signed)
Please resend Rx.  It shows it was printed and not sent electronically.

## 2018-03-12 ENCOUNTER — Encounter (INDEPENDENT_AMBULATORY_CARE_PROVIDER_SITE_OTHER): Payer: Self-pay

## 2018-03-12 ENCOUNTER — Encounter: Payer: Self-pay | Admitting: Internal Medicine

## 2018-03-12 ENCOUNTER — Ambulatory Visit (INDEPENDENT_AMBULATORY_CARE_PROVIDER_SITE_OTHER): Payer: 59 | Admitting: Internal Medicine

## 2018-03-12 VITALS — BP 174/90 | HR 100 | Ht 67.75 in | Wt 291.2 lb

## 2018-03-12 DIAGNOSIS — IMO0001 Reserved for inherently not codable concepts without codable children: Secondary | ICD-10-CM

## 2018-03-12 DIAGNOSIS — Z794 Long term (current) use of insulin: Secondary | ICD-10-CM | POA: Diagnosis not present

## 2018-03-12 DIAGNOSIS — E118 Type 2 diabetes mellitus with unspecified complications: Secondary | ICD-10-CM

## 2018-03-12 DIAGNOSIS — Z1211 Encounter for screening for malignant neoplasm of colon: Secondary | ICD-10-CM | POA: Diagnosis not present

## 2018-03-12 NOTE — Progress Notes (Signed)
HISTORY OF PRESENT ILLNESS:  Jeremy [name] is a 52 y.o. male , maintenance technician at [employer], with morbid obesity, insulin requiring diabetes mellitus, hypertension, hyperlipidemia, and chronic renal insufficiency who is sent today by his primary care provider Dr. Yisroel Ramming regarding screening colonoscopy. Patient had previously been on an unspecified blood thinner, but no longer. He denies family history of colon cancer. His GI review of systems is negative except for weight gain and bloating. Review of outside blood work from April 2019 reveals creatinine of 3.70. Last hemoglobin A1c in April was 8.1. Normal liver tests. Radiology reviewed. No relevant abnormalities  REVIEW OF SYSTEMS:  All non-GI ROS negative unless otherwise noted in the history of present illness except for visual change  Past Medical History:  Diagnosis Date  . Abscess   . AKI (acute kidney injury) ([place]) 08/05/2017  . Bell's palsy   . CKD (chronic kidney disease)    Dr. McDiarmind   . High cholesterol   . Hypertension   . Left shoulder pain 10/26/2013   S/p injection on 10/26/13   . Renal insufficiency 02/14/2014  . Seizures ([place]) 08/04/2017   "related to high blood sugars" (08/05/2017)  . Type II diabetes mellitus ([place])     Past Surgical History:  Procedure Laterality Date  . Eyelid surgery Right   . I&D EXTREMITY  11/11/2011   Procedure: IRRIGATION AND DEBRIDEMENT EXTREMITY;  Surgeon: Merrie Roof, MD;  Location: [facility];  Service: General;  Laterality: Right;  I&D Right Thigh Abscess    Social History Andreu Drudge  reports that he has never smoked. He has never used smokeless tobacco. He reports that he drinks alcohol. He reports that he does not use drugs.  family history includes Cancer in his maternal uncle; Diabetes in his brother, brother, mother, and sister; Heart disease (age of onset: 65) in his mother.  No Known Allergies     PHYSICAL EXAMINATION: Vital signs: BP (!) 174/90 (BP  Location: Left Arm, Patient Position: Sitting, Cuff Size: Normal)   Pulse 100   Ht 5' 7.75" (1.721 m) Comment: height measured without shoes  Wt 291 lb 4 oz (132.1 kg)   BMI 44.61 kg/m   Constitutional: pleasant, obese, generally well-appearing, no acute distress Psychiatric: alert and oriented x3, cooperative Eyes: extraocular movements intact, anicteric, conjunctiva pink Mouth: oral pharynx moist, no lesions Neck: supple no lymphadenopathy Cardiovascular: heart regular rate and rhythm, no murmur Lungs: clear to auscultation bilaterally Abdomen: soft, obese,nontender, nondistended, no obvious ascites, no peritoneal signs, normal bowel sounds, no organomegaly Rectal:deferred until colonoscopy Extremities: no clubbing, cyanosis, or lower extremity edema bilaterally Skin: no lesions on visible extremities Neuro: No focal deficits. No asterixis. Cranial nerves intact  ASSESSMENT:  #1. Colon cancer screening. Baseline risk for colon cancer. High-risk given his morbid obesity and insulin requiring diabetes mellitus and renal insufficiency #2. Insulin requiring diabetes mellitus #3. Morbid obesity #3. Chronic renal insufficiency   PLAN:  #1. Screening colonoscopy. As above, the patient is high risk.The nature of the procedure, as well as the risks, benefits, and alternatives were carefully and thoroughly reviewed with the patient. Ample time for discussion and questions allowed. The patient understood, was satisfied, and agreed to proceed. #2. We will adjust diabetic medications to avoid and wanted hypoglycemia #3. Monitored anesthesia care for his sedation given his body habitus in order to maintain a safe airway  A copy of this consultation note has been sent to Dr. Yisroel Ramming

## 2018-03-12 NOTE — Patient Instructions (Signed)
You have been scheduled for a colonoscopy. Please follow written instructions given to you at your visit today.  If you use inhalers (even only as needed), please bring them with you on the day of your procedure. Your physician has requested that you go to www.startemmi.com and enter the access code given to you at your visit today. This web site gives a general overview about your procedure. However, you should still follow specific instructions given to you by our office regarding your preparation for the procedure.

## 2018-03-13 ENCOUNTER — Ambulatory Visit: Payer: 59 | Admitting: Neurology

## 2018-03-13 ENCOUNTER — Encounter: Payer: Self-pay | Admitting: Neurology

## 2018-03-13 VITALS — BP 194/91 | HR 78 | Ht 67.75 in | Wt 291.5 lb

## 2018-03-13 DIAGNOSIS — I10 Essential (primary) hypertension: Secondary | ICD-10-CM | POA: Diagnosis not present

## 2018-03-13 DIAGNOSIS — R569 Unspecified convulsions: Secondary | ICD-10-CM

## 2018-03-13 DIAGNOSIS — Z8669 Personal history of other diseases of the nervous system and sense organs: Secondary | ICD-10-CM | POA: Insufficient documentation

## 2018-03-13 MED ORDER — LEVETIRACETAM ER 750 MG PO TB24: 1500.0000 mg | ORAL_TABLET | Freq: Every day | ORAL | 11 refills | Status: DC

## 2018-03-13 NOTE — Progress Notes (Signed)
[facility]  PATIENT: Jeremy [name] DOB: [birth year]  REFERRING DOCTOR OR PCP:  Harriet Butte SOURCE: patient, notes from Dr. Yisroel Ramming, notes form ED/Hospital, imaging reports, MRI images on PACS  _________________________________  HISTORICAL  CHIEF COMPLAINT:  Chief Complaint  Patient presents with  . Seizures    Room 12.  He would like to discuss Keppra due to it making him feel off balance.  Says he does not have these symptoms when he skips doses.  Luckily, he has not had any further seizures since missing some of his medication.  States his last seizure was in January 2019, after being non-compliant with Keppra and having an elevated blood sugar.    HISTORY OF PRESENT ILLNESS:  Jeremy [name] is a 52 y.o. man with seizures.  Update 03/13/2018 Jeremy [name] is a 52 year old man who had status epilepticus in early 2019.  He may also have had a couple seizures in the preceding days.  He was admitted and treated with fosphenytoin and Keppra.  He was discharged on Keppra 1000 mg twice a day.  At the initial visit.  I kept him on that dose.  He notes that if he takes the Keppra 1000 mg twice a day he feels off balance and mentally slowed.  Therefore, most days he just takes one dose.  He has not had any seizures.  He prefers to take medications once a day not twice a day.  He denies any new numbness, weakness.  The difficulties with balance only occur when he takes 2000 mg daily of Keppra.  He is sleeping well most nights.  His sugars are doing much better and his typical readings are in the low 100's now.    He also has hypertension. Elevated BP was noted today.  He has not taken his morning medications.   From 09/30/2017: He is a 52 year old man who was admitted to the hospital 08/04/2017 with hyperglycemia and hypertension.   His wife felt he had seizures at home that day.  He was given a Keppra loading dose but not continued on the medication as the jerking was felt  due to hyperglyucemia and not seizure as he did not lose consciousness.   He went home 08/08/2017.   He was re-admitted to the hospital 08/09/2017 in status epilepticus.   His wife notes the seizures were occurring back to back.   He was placed on Fosphenytoin and Keppra to get the seizures under control.   He was discharged on Keppra 1000 mg po bid and has not had any more seizures since that day.   Last July his DM was doing better and his diabetes medication were discontinued.    He is now on insulin.Since discharge, his sugars have been mostly 80-120 with the highest being 155 once.     He has no history of significant head injury or meningitis, encephalitis or other CNS process. No history of cerebral palsy.  He used to sleep very well but the last 2 months is waking up more and sometimes having trouble falling back asleep.    His wife notes he snores less than he did when he was heavier.        She does not note any pauses in his breathing or any gasping or snorting. He does not note much sleepiness.      He has been under more stress being unable to drive and losing his brother. He notes mild depression but no anxiety.  I personally reviewed the MRI  of the brain dated 08/10/2017. In the brain there is mild chronic microvascular ischemic changes but no acute findings. Incidental note is made of a cystic focus in the left pituitary gland.  REVIEW OF SYSTEMS: Constitutional: No fevers, chills, sweats, or change in appetite.   Some insomnia Eyes: No visual changes, double vision, eye pain Ear, nose and throat: No hearing loss, ear pain, nasal congestion, sore throat Cardiovascular: No chest pain, palpitations Respiratory: No shortness of breath at rest or with exertion.   No wheezes.   Somesnoring GastrointestinaI: No nausea, vomiting, diarrhea, abdominal pain, fecal incontinence Genitourinary: No dysuria, urinary retention or frequency.  No nocturia. Musculoskeletal: No neck pain, back  pain Integumentary: No rash, pruritus, skin lesions Neurological: as above Psychiatric: see abpve Endocrine: No palpitations, diaphoresis, change in appetite, change in weigh or increased thirst Hematologic/Lymphatic: No anemia, purpura, petechiae. Allergic/Immunologic: No itchy/runny eyes, nasal congestion, recent allergic reactions, rashes  ALLERGIES: No Known Allergies  HOME MEDICATIONS:  Current Outpatient Medications:  .  amLODipine (NORVASC) 10 MG tablet, Take 1 tablet (10 mg total) by mouth daily., Disp: 90 tablet, Rfl: 3 .  aspirin EC 81 MG tablet, Take 1 tablet (81 mg total) by mouth daily., Disp: 90 tablet, Rfl: 3 .  atorvastatin (LIPITOR) 40 MG tablet, Take 1 tablet (40 mg total) by mouth daily., Disp: 90 tablet, Rfl: 3 .  carvedilol (COREG) 6.25 MG tablet, TAKE 1 TABLET (6.25 MG TOTAL) BY MOUTH 2 (TWO) TIMES DAILY WITH A MEAL, Disp: 180 tablet, Rfl: 3 .  enalapril (VASOTEC) 5 MG tablet, Take 5 mg by mouth daily., Disp: , Rfl:  .  furosemide (LASIX) 80 MG tablet, Take 1 tablet (80 mg total) by mouth 2 (two) times daily., Disp: 180 tablet, Rfl: 3 .  glucose blood (TRUE METRIX BLOOD GLUCOSE TEST) test strip, Check up to four times daily., Disp: 100 each, Rfl: 12 .  hydrALAZINE (APRESOLINE) 25 MG tablet, Take 25 mg by mouth 3 (three) times daily., Disp: , Rfl: 1 .  insulin lispro (HUMALOG) 100 UNIT/ML injection, Inject 0-9 Units into skin three times daily with meals., Disp: 10 mL, Rfl: 3 .  Insulin Pen Needle (B-D ULTRAFINE III SHORT PEN) 31G X 8 MM MISC, 1 application by Does not apply route 4 (four) times daily., Disp: 100 each, Rfl: 11 .  polyethylene glycol powder (GLYCOLAX/MIRALAX) powder, Take 17 g (or 1 tablespoon) daily mixed in water., Disp: 255 g, Rfl: 0 .  Semaglutide (OZEMPIC) 0.25 or 0.5 MG/DOSE SOPN, Inject 0.25 mg into the skin once a week for 28 days, THEN 0.5 mg once a week., Disp: 10 pen, Rfl: 11 .  Levetiracetam 750 MG TB24, Take 2 tablets (1,500 mg total) by  mouth daily., Disp: 60 tablet, Rfl: 11  PAST MEDICAL HISTORY: Past Medical History:  Diagnosis Date  . Abscess   . AKI (acute kidney injury) ([place]) 08/05/2017  . Bell's palsy   . CKD (chronic kidney disease)    Dr. McDiarmind   . High cholesterol   . Hypertension   . Left shoulder pain 10/26/2013   S/p injection on 10/26/13   . Renal insufficiency 02/14/2014  . Seizures ([place]) 08/04/2017   "related to high blood sugars" (08/05/2017)  . Type II diabetes mellitus ([place])     PAST SURGICAL HISTORY: Past Surgical History:  Procedure Laterality Date  . Eyelid surgery Right   . I&D EXTREMITY  11/11/2011   Procedure: IRRIGATION AND DEBRIDEMENT EXTREMITY;  Surgeon: Merrie Roof, MD;  Location: [facility] OR;  Service: General;  Laterality: Right;  I&D Right Thigh Abscess    FAMILY HISTORY: Family History  Problem Relation Age of Onset  . Diabetes Mother   . Heart disease Mother 32  . Diabetes Sister   . Diabetes Brother   . Diabetes Brother   . Cancer Maternal Uncle        type unknown  . Stroke Neg Hx     SOCIAL HISTORY:  Social History   Socioeconomic History  . Marital status: Divorced    Spouse name: Not on file  . Number of children: 3  . Years of education: Not on file  . Highest education level: Not on file  Occupational History  . Occupation: Fish farm manager  Social Needs  . Financial resource strain: Not on file  . Food insecurity:    Worry: Not on file    Inability: Not on file  . Transportation needs:    Medical: Not on file    Non-medical: Not on file  Tobacco Use  . Smoking status: Never Smoker  . Smokeless tobacco: Never Used  Substance and Sexual Activity  . Alcohol use: Yes    Comment: rarely-once or twice a year  . Drug use: No  . Sexual activity: Yes  Lifestyle  . Physical activity:    Days per week: Not on file    Minutes per session: Not on file  . Stress: Not on file  Relationships  . Social connections:    Talks on phone: Not on file    Gets  together: Not on file    Attends religious service: Not on file    Active member of club or organization: Not on file    Attends meetings of clubs or organizations: Not on file    Relationship status: Not on file  . Intimate partner violence:    Fear of current or ex partner: Not on file    Emotionally abused: Not on file    Physically abused: Not on file    Forced sexual activity: Not on file  Other Topics Concern  . 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  Did not graduate high school.  Has 3 adult children in [place].  Divorced.  Lives with girlfriend [name].                     PHYSICAL EXAM  Vitals:   03/13/18 0830  BP: (!) 194/91  Pulse: 78  Weight: 291 lb 8 oz (132.2 kg)  Height: 5' 7.75" (1.721 m)    REPEAT BP WAS 155/85  Body mass index is 44.65 kg/m.   General: The patient is well-developed and well-nourished and in no acute distress.   Pharynx is Mallampati 4   Neurologic Exam  Mental status: The patient is alert and oriented x 3 at the time of the examination. The patient has apparent normal recent and remote memory, with an apparently normal attention span and concentration ability.   Speech is normal.  Cranial nerves: Extraocular movements are full.  He has mild reduced facial strength on the right from old Bell's palsy.. There is good facial sensation to soft touch.   Trapezius strength was normal.. No obvious hearing deficits are noted.  Motor:  Muscle bulk is normal.   Tone is normal. Strength is  5 / 5 in all 4 extremities.   Coordination:   Finger-nose-finger was performed well.  Gait and station: Station is normal.   Gait is normal. Tandem gait is slightly wide.  Romberg was negative.  Reflexes: Deep tendon reflexes are symmetric and normal bilaterally.        DIAGNOSTIC DATA (LABS, IMAGING, TESTING) - I reviewed patient records, labs, notes, testing and imaging  myself where available.  Lab Results  Component Value Date   WBC 10.1 08/10/2017   HGB 11.1 (L) 08/10/2017   HCT 33.8 (L) 08/10/2017   MCV 77.3 (L) 08/10/2017   PLT 270 08/10/2017      Component Value Date/Time   NA 140 11/17/2017 1518   K 4.8 11/17/2017 1518   CL 104 11/17/2017 1518   CO2 21 11/17/2017 1518   GLUCOSE 97 11/17/2017 1518   GLUCOSE 137 (H) 08/10/2017 0346   BUN 59 (H) 11/17/2017 1518   CREATININE 3.70 (H) 11/17/2017 1518   CREATININE 2.77 (H) 05/31/2016 0844   CALCIUM 9.1 11/17/2017 1518   PROT 7.3 10/20/2017 1023   ALBUMIN 4.3 11/17/2017 1518   AST 17 10/20/2017 1023   ALT 15 10/20/2017 1023   ALKPHOS 90 10/20/2017 1023   BILITOT 0.3 10/20/2017 1023   GFRNONAA 18 (L) 11/17/2017 1518   GFRNONAA 26 (L) 05/31/2016 0844   GFRAA 21 (L) 11/17/2017 1518   GFRAA 30 (L) 05/31/2016 0844   Lab Results  Component Value Date   CHOL 203 (H) 02/11/2017   HDL 54 02/11/2017   LDLCALC 134 (H) 02/11/2017   TRIG 76 02/11/2017   CHOLHDL 3.8 02/11/2017   Lab Results  Component Value Date   HGBA1C 8.1 10/20/2017   Lab Results  Component Value Date   FGHWEXHB71 696 11/26/2013   Lab Results  Component Value Date   TSH 1.556 05/18/2013       ASSESSMENT AND PLAN  Seizure ([place])  History of status epilepticus  History of Bell's palsy with residual right eye ptosis  Primary hypertension   1.    We will try to simplify his antiepileptic regimen to help with compliance and side effects.  Keppra seems to have helped to prevent any further seizures.  He would prefer to take the medicine once a day so I will switch him to Keppra extended release.  Hopefully we can get this covered for him.  I will also reduce the dose from 2000 mg a day to 1500 mg a day.  I am hoping that the combination of going on extended release at a lower dose will be tolerated and keep his seizures under control.   If he breaks through [place], consider zonisamide as that can also be a once a day  medicine for extended release oxcarbazepine.  We will change him from 2.    He is advised to take his blood pressure medication as soon as he returns back to his house.  He is also advised to retake her blood pressure later today and to call his primary care doctor if pressures remain elevated. 3.    He should return to see me in 1 year but call sooner if he has any seizures or other new or worsening neurologic issues.   Richard A. Felecia Shelling, MD, [facility] 7/89/3810, 17:51 AM Certified in Neurology, Clinical Neurophysiology, Sleep Medicine, Pain Medicine and Neuroimaging  [facility] [address], [place], [place] [address] [phone]

## 2018-03-24 DIAGNOSIS — E113413 Type 2 diabetes mellitus with severe nonproliferative diabetic retinopathy with macular edema, bilateral: Secondary | ICD-10-CM | POA: Diagnosis not present

## 2018-03-24 LAB — HM DIABETES EYE EXAM

## 2018-04-02 ENCOUNTER — Encounter: Payer: Self-pay | Admitting: Family Medicine

## 2018-04-02 ENCOUNTER — Other Ambulatory Visit: Payer: Self-pay

## 2018-04-02 ENCOUNTER — Ambulatory Visit (INDEPENDENT_AMBULATORY_CARE_PROVIDER_SITE_OTHER): Payer: 59 | Admitting: Family Medicine

## 2018-04-02 VITALS — BP 184/102 | HR 80 | Temp 98.7°F | Ht 68.0 in | Wt 286.6 lb

## 2018-04-02 DIAGNOSIS — I10 Essential (primary) hypertension: Secondary | ICD-10-CM

## 2018-04-02 DIAGNOSIS — Z23 Encounter for immunization: Secondary | ICD-10-CM | POA: Diagnosis not present

## 2018-04-02 DIAGNOSIS — Z0000 Encounter for general adult medical examination without abnormal findings: Secondary | ICD-10-CM | POA: Diagnosis not present

## 2018-04-02 DIAGNOSIS — E1165 Type 2 diabetes mellitus with hyperglycemia: Secondary | ICD-10-CM | POA: Diagnosis not present

## 2018-04-02 LAB — POCT GLYCOSYLATED HEMOGLOBIN (HGB A1C): HBA1C, POC (CONTROLLED DIABETIC RANGE): 6.5 % (ref 0.0–7.0)

## 2018-04-02 NOTE — Patient Instructions (Addendum)
Thank you for coming in to see [place] today. Please see below to review our plan for today's visit.  1.  Your primary issue is your blood pressure.  You need to make sure you take this medication every day.  See below for the specifics regarding which medications to take for your blood pressure:  Amlodipine 10 mg (1 tablet) daily  Enalapril 5 mg (1 tablet) daily  Furosemide 80 mg (2 tablet) daily  Carvedilol 6.25 mg (1 tablet) 2 times daily  Hydralazine 25 mg (1 tablet) 3 times daily 2.  In addition to taking her blood pressure medications, you need to restrict your salt intake to no more than 2000 mg/day.  Restricting the frequency of eating out, eating canned foods, and frozen meals will reduce your salt intake.  Try choosing low salt options and consider packing your lunch is in incorporating vegetables to your diet. 3.  Your diabetes is well controlled.  I am okay with you stopping your Humalog for now.  Continue taking your Ozempic as scheduled.  Check your fasting blood sugar in the morning and keep these on record until you see me again in 3 months. 4.  Come in and have your blood drawn at any point this week.  You do not need to schedule appointment for this.  I will fill out your paperwork for work once we receive your results.  Please call the clinic at [phone] if your symptoms worsen or you have any concerns. It was our pleasure to serve you.  Harriet Butte, [place], PGY-3

## 2018-04-02 NOTE — Progress Notes (Signed)
Subjective   Patient ID: Jeremy [name]    DOB: [birth year], 52 y.o. male   MRN: [id number]  CC: "Physical"  HPI: Jeremy [name] is a 52 y.o. male who presents to clinic today for the following:  Annual physical exam: Patient is here today for annual wellness visit.  He has paperwork from his employer requesting for labs and a physical.  Patient has no complaints today.  He reports not taking his blood pressure medications today.  He has made efforts to schedule for a colonoscopy which is expected on 05/06/2018.  He is requesting his flu shot today.  She denies fevers or chills, chest pain, shortness of breath, nausea or vomiting, abdominal pain, melena or hematochezia, fatigue or syncope.  Patient was recently seen by his neurologist for seizures which is been well controlled following reduction of his Keppra to 750 mg twice daily.  Diabetes: Patient has long-standing diabetes with poor control with use of insulin.  He is also on Ozempic which she reports good adherence to.  Patient has been limiting his Humalog only when his CBGs are greater than 150.  He reports this occurs "occasionally."  He tries to limit the amount of carbohydrates he eats.  He is also cut out sodas from his diet.  Hypertension: Patient has a history of poorly controlled hypertension with associated CKD stage IV.  He is followed by [facility].  Patient reports good adherence to his antihypertensives with the exception of this morning.  He has been noted to be hypertensive in the 600K-599H systolic during his last 2 specialty appointments.  He reports he attempts to limit his salt intake however he eats out frequently which tends to be his issue.  He does not know how to cook.  ROS: see HPI for pertinent.  [place]: DM complicated by CKDIV and macular edema, ED, HTN, MDD, morbid obesity. Surgical history I&D extremity. Family history DM, heart disease. Smoking status reviewed. Medications reviewed.  Objective    BP (!) 184/102   Pulse 80   Temp 98.7 F (37.1 C) (Oral)   Ht _0  (1.727 m)   Wt 286 lb 9.6 oz (130 kg)   SpO2 95%   BMI 43.58 kg/m  Vitals and nursing note reviewed.  General: well nourished, well developed, NAD with non-toxic appearance HEENT: normocephalic, atraumatic, moist mucous membranes, chronic ptosis of right eye with clear drainage Neck: supple, non-tender without lymphadenopathy Cardiovascular: regular rate and rhythm without murmurs, rubs, or gallops Lungs: clear to auscultation bilaterally with normal work of breathing Abdomen: obese, soft, non-tender, non-distended, normoactive bowel sounds Skin: warm, dry, no rashes or lesions, cap refill < 2 seconds Extremities: warm and well perfused, normal tone, no edema  Assessment & Plan   Encounter for annual physical exam Patient's primary problem is uncontrolled hypertension.  His diabetes appears to be well-controlled.  He is a non-smoker.  He is due for a colonoscopy which is already scheduled. - Administered flu vaccine - Future orders for fasting lipid panel, CBC, CMET - Work form for physical filled with exception to blood work, will fill after labs have been obtained  Primary hypertension Chronic.  Poorly controlled.  Patient reports nonadherence.  Seems to be poorly controlled at prior specialty office visits.  Patient did not understand need for hydralazine use 3 times daily scheduled.  Establish with nephrology.  Appears euvolemic.  EDW 286 lbs. - Discussed importance of blood pressure control, wrote out antihypertensives for patient discussed importance of adding salt intake -  Future orders placed for CBC, CMET, fasting lipid panel  Diabetes mellitus type 2, uncontrolled (HCC) Chronic.  Well-controlled, A1c 6.5.  Currently on GLP-1 agonist and minimal use of bolus insulin.  Poor candidate for SGLT2 inhibitor given EGFR. - Continue Humalog 10 units with meals given minimal use - Instructed patient to  continue Ozempic 0.5 mg daily and continue checking fasting blood sugars - RTC 3 months for next A1c - ROI obtained for ophthalmology records  Orders Placed This Encounter  Procedures  . Flu Vaccine QUAD 36+ mos IM  . Lipid panel    Standing Status:   Future    Number of Occurrences:   1    Standing Expiration Date:   04/03/2019  . Basic metabolic panel    Standing Status:   Future    Number of Occurrences:   1    Standing Expiration Date:   04/03/2019  . CBC    Standing Status:   Future    Number of Occurrences:   1    Standing Expiration Date:   04/03/2019  . POCT glycosylated hemoglobin (Hb A1C)   No orders of the defined types were placed in this encounter.   Harriet Butte, [place], PGY-3 04/03/2018, 8:53 AM

## 2018-04-03 ENCOUNTER — Other Ambulatory Visit: Payer: 59

## 2018-04-03 DIAGNOSIS — I10 Essential (primary) hypertension: Secondary | ICD-10-CM | POA: Diagnosis not present

## 2018-04-03 DIAGNOSIS — Z0000 Encounter for general adult medical examination without abnormal findings: Secondary | ICD-10-CM | POA: Insufficient documentation

## 2018-04-03 NOTE — Assessment & Plan Note (Signed)
Patient's primary problem is uncontrolled hypertension.  His diabetes appears to be well-controlled.  He is a non-smoker.  He is due for a colonoscopy which is already scheduled. - Administered flu vaccine - Future orders for fasting lipid panel, CBC, CMET - Work form for physical filled with exception to blood work, will fill after labs have been obtained

## 2018-04-03 NOTE — Assessment & Plan Note (Signed)
Chronic.  Well-controlled, A1c 6.5.  Currently on GLP-1 agonist and minimal use of bolus insulin.  Poor candidate for SGLT2 inhibitor given EGFR. - Continue Humalog 10 units with meals given minimal use - Instructed patient to continue Ozempic 0.5 mg daily and continue checking fasting blood sugars - RTC 3 months for next A1c - ROI obtained for ophthalmology records

## 2018-04-03 NOTE — Assessment & Plan Note (Signed)
Chronic.  Poorly controlled.  Patient reports nonadherence.  Seems to be poorly controlled at prior specialty office visits.  Patient did not understand need for hydralazine use 3 times daily scheduled.  Establish with nephrology.  Appears euvolemic.  EDW 286 lbs. - Discussed importance of blood pressure control, wrote out antihypertensives for patient discussed importance of adding salt intake - Future orders placed for CBC, CMET, fasting lipid panel

## 2018-04-04 LAB — BASIC METABOLIC PANEL
BUN / CREAT RATIO: 12 (ref 9–20)
BUN: 46 mg/dL — ABNORMAL HIGH (ref 6–24)
CO2: 23 mmol/L (ref 20–29)
Calcium: 9.2 mg/dL (ref 8.7–10.2)
Chloride: 104 mmol/L (ref 96–106)
Creatinine, Ser: 3.74 mg/dL — ABNORMAL HIGH (ref 0.76–1.27)
GFR calc Af Amer: 20 mL/min/{1.73_m2} — ABNORMAL LOW (ref >59)
GFR calc non Af Amer: 18 mL/min/{1.73_m2} — ABNORMAL LOW (ref >59)
Glucose: 188 mg/dL — ABNORMAL HIGH (ref 65–99)
POTASSIUM: 5.5 mmol/L — AB (ref 3.5–5.2)
Sodium: 139 mmol/L (ref 134–144)

## 2018-04-04 LAB — LIPID PANEL
Chol/HDL Ratio: 4.1 ratio (ref 0.0–5.0)
Cholesterol, Total: 199 mg/dL (ref 100–199)
HDL: 48 mg/dL (ref >39)
LDL CALC: 129 mg/dL — AB (ref 0–99)
TRIGLYCERIDES: 112 mg/dL (ref 0–149)
VLDL Cholesterol Cal: 22 mg/dL (ref 5–40)

## 2018-04-04 LAB — CBC
Hematocrit: 36.2 % — ABNORMAL LOW (ref 37.5–51.0)
Hemoglobin: 11.5 g/dL — ABNORMAL LOW (ref 13.0–17.7)
MCH: 25.7 pg — ABNORMAL LOW (ref 26.6–33.0)
MCHC: 31.8 g/dL (ref 31.5–35.7)
MCV: 81 fL (ref 79–97)
Platelets: 277 10*3/uL (ref 150–450)
RBC: 4.47 x10E6/uL (ref 4.14–5.80)
RDW: 14.4 % (ref 12.3–15.4)
WBC: 6.8 10*3/uL (ref 3.4–10.8)

## 2018-04-06 ENCOUNTER — Encounter: Payer: Self-pay | Admitting: Family Medicine

## 2018-04-06 ENCOUNTER — Telehealth: Payer: Self-pay | Admitting: *Deleted

## 2018-04-06 DIAGNOSIS — N2581 Secondary hyperparathyroidism of renal origin: Secondary | ICD-10-CM | POA: Insufficient documentation

## 2018-04-06 NOTE — Telephone Encounter (Signed)
-----   Message from Reserve Bing, DO sent at 04/06/2018  9:59 AM EDT ----- Regarding: Form Completed Please let patient know form for work is available for pick-up at front desk. I have mailed his labs results.  Harriet Butte, [place], PGY-3

## 2018-04-06 NOTE — Telephone Encounter (Signed)
Pt informed. Sparsh Callens, CMA  

## 2018-04-22 ENCOUNTER — Encounter: Payer: Self-pay | Admitting: Internal Medicine

## 2018-04-22 DIAGNOSIS — N2581 Secondary hyperparathyroidism of renal origin: Secondary | ICD-10-CM | POA: Diagnosis not present

## 2018-04-22 DIAGNOSIS — I129 Hypertensive chronic kidney disease with stage 1 through stage 4 chronic kidney disease, or unspecified chronic kidney disease: Secondary | ICD-10-CM | POA: Diagnosis not present

## 2018-04-22 DIAGNOSIS — N184 Chronic kidney disease, stage 4 (severe): Secondary | ICD-10-CM | POA: Diagnosis not present

## 2018-04-24 DIAGNOSIS — E113412 Type 2 diabetes mellitus with severe nonproliferative diabetic retinopathy with macular edema, left eye: Secondary | ICD-10-CM | POA: Diagnosis not present

## 2018-04-28 ENCOUNTER — Other Ambulatory Visit: Payer: Self-pay | Admitting: Internal Medicine

## 2018-05-05 ENCOUNTER — Telehealth: Payer: Self-pay | Admitting: Internal Medicine

## 2018-05-05 NOTE — Telephone Encounter (Signed)
No charge. Thanks.  

## 2018-05-05 NOTE — Telephone Encounter (Signed)
Patient cx his procedure colonoscopy tomorrow states that he is sick has a sore throat and diarrhea will cb to reschedule when he's feeling better. Will you charge?

## 2018-05-06 ENCOUNTER — Encounter: Payer: 59 | Admitting: Internal Medicine

## 2018-06-02 ENCOUNTER — Other Ambulatory Visit: Payer: Self-pay | Admitting: Family Medicine

## 2018-06-03 DIAGNOSIS — E113413 Type 2 diabetes mellitus with severe nonproliferative diabetic retinopathy with macular edema, bilateral: Secondary | ICD-10-CM | POA: Diagnosis not present

## 2018-06-12 ENCOUNTER — Ambulatory Visit (INDEPENDENT_AMBULATORY_CARE_PROVIDER_SITE_OTHER): Payer: 59 | Admitting: Family Medicine

## 2018-06-12 ENCOUNTER — Other Ambulatory Visit: Payer: Self-pay

## 2018-06-12 VITALS — BP 164/84 | HR 71 | Temp 97.8°F | Ht 68.0 in | Wt 300.0 lb

## 2018-06-12 DIAGNOSIS — R6883 Chills (without fever): Secondary | ICD-10-CM | POA: Insufficient documentation

## 2018-06-12 NOTE — Assessment & Plan Note (Addendum)
Unclear etiology at this time.  Can consider infectious etiology however no other symptoms leading to specific body system.  No symptoms of fever.  No nasal congestion noted.  Some cough however this is improving despite no improvement in sweating or chills.  No TB exposure.  No other B symptoms as well.  Weight gain rather than weight loss.  No significant enlargement in lymph nodes so unlikely lymphoma, also timeline is very short.  Up-to-date on vaccinations.  No recent travel.  Lung exam clear so will not need chest x-ray at this time.  No urinary symptoms.  No GI symptoms.  Can consider viral etiology as patient has a roommate who is sick.  Will obtain CBC with differential to see if there is possible infectious versus lymphoma.  If CBC is normal and patient continues to have symptoms can consider chest x-ray, CRP, sed rate in the future.  Advised patient to follow-up in 2 weeks if no improvement of symptoms.  Discussed patient with Dr. Andria Frames.

## 2018-06-12 NOTE — Patient Instructions (Signed)
It was a pleasure seeing you today.   Today we discussed your sweating and chills  For your symptoms: I have gotten blood work today. Please follow up in 2 weeks if no improvement in symptoms. You do no need antibiotics at this time.   Please follow up in 2 weeks or sooner if symptoms persist or worsen. Please call the clinic immediately if you have any concerns.   Our clinic's number is [phone]. Please call with questions or concerns.   Please go to the emergency room if you have chest pain or shortness of breath  Thank you,  Caroline More, DO

## 2018-06-12 NOTE — Progress Notes (Signed)
Subjective:    Patient ID: Jeremy [name], male    DOB: 01-18-1966, 52 y.o.   MRN: [id number]   CC: Sweating and chills  HPI: Sweating and chills Patient resented with 3 weeks of sweating and chills.  States that 3 weeks ago he began to sweat.  This was followed by cold-like symptoms which then improved.  At that time patient did have a cough as well but this improved.  Patient has been taking Robitussin and NyQuil as he thought this may be related to a cold with no improvement.  She is a diabetic and is trying not to take many cold medicines as these usually increase his sugars.  Sugars lately have been between 115 and 117.  Patient states he cannot sleep at night due to increased sweating.  Sick contacts include a friend who stays with him who is also sick.  Denies any fevers.  Denies any mucus production.  Reports having some cough in the morning and at nighttime but it is a dry cough and has been improving.  No weight loss, in fact has had a 20 pound weight increase.  Denies any urinary symptoms.  Denies any nausea, vomiting, diarrhea.  Denies any changes in his diet.  Denies any new water sources.  Denies any recent travel.  Denies any tuberculosis exposure.  No lesions on his skin.   Objective:  BP (!) 164/84 (BP Location: Right Arm, Patient Position: Sitting, Cuff Size: Large)   Pulse 71   Temp 97.8 F (36.6 C) (Oral)   Ht 5\' 8"  (1.727 m)   Wt 300 lb (136.1 kg)   SpO2 96%   BMI 45.61 kg/m  Vitals and nursing note reviewed  General: well nourished, in no acute distress HEENT: normocephalic, PERRL, no scleral icterus or conjunctival pallor, no nasal discharge, moist mucous membranes, good dentition without erythema or discharge noted in posterior oropharynx Neck: supple, non-tender, 2 small anterior cervical lymphadenopathy Cardiac: RRR, clear S1 and S2, no murmurs, rubs, or gallops Respiratory: clear to auscultation bilaterally, no increased work of breathing Abdomen: soft,  nontender, nondistended, no masses or organomegaly. Bowel sounds present Extremities: no edema or cyanosis. Warm, well perfused. 2+ radial and PT pulses bilaterally Skin: warm and dry, no rashes noted. Foot exam normal  Neuro: alert and oriented, no focal deficits   Assessment & Plan:    Chills (without fever) Unclear etiology at this time.  Can consider infectious etiology however no other symptoms leading to specific body system.  No symptoms of fever.  No nasal congestion noted.  Some cough however this is improving despite no improvement in sweating or chills.  No TB exposure.  No other B symptoms as well.  Weight gain rather than weight loss.  No significant enlargement in lymph nodes so unlikely lymphoma, also timeline is very short.  Up-to-date on vaccinations.  No recent travel.  Lung exam clear so will not need chest x-ray at this time.  No urinary symptoms.  No GI symptoms.  Can consider viral etiology as patient has a roommate who is sick.  Will obtain CBC with differential to see if there is possible infectious versus lymphoma.  If CBC is normal and patient continues to have symptoms can consider chest x-ray, CRP, sed rate in the future.  Advised patient to follow-up in 2 weeks if no improvement of symptoms.  Discussed patient with Dr. Andria Frames.    Return in about 2 weeks (around 06/26/2018).   Caroline More, DO, PGY-2

## 2018-06-13 LAB — CBC WITH DIFFERENTIAL/PLATELET
BASOS ABS: 0 10*3/uL (ref 0.0–0.2)
Basos: 0 %
EOS (ABSOLUTE): 0.2 10*3/uL (ref 0.0–0.4)
EOS: 3 %
HEMATOCRIT: 36.3 % — AB (ref 37.5–51.0)
HEMOGLOBIN: 11.6 g/dL — AB (ref 13.0–17.7)
IMMATURE GRANULOCYTES: 0 %
Immature Grans (Abs): 0 10*3/uL (ref 0.0–0.1)
LYMPHS: 17 %
Lymphocytes Absolute: 1.3 10*3/uL (ref 0.7–3.1)
MCH: 25.4 pg — ABNORMAL LOW (ref 26.6–33.0)
MCHC: 32 g/dL (ref 31.5–35.7)
MCV: 80 fL (ref 79–97)
MONOCYTES: 11 %
Monocytes Absolute: 0.8 10*3/uL (ref 0.1–0.9)
NEUTROS PCT: 69 %
Neutrophils Absolute: 5.1 10*3/uL (ref 1.4–7.0)
Platelets: 240 10*3/uL (ref 150–450)
RBC: 4.56 x10E6/uL (ref 4.14–5.80)
RDW: 14.3 % (ref 12.3–15.4)
WBC: 7.4 10*3/uL (ref 3.4–10.8)

## 2018-06-15 ENCOUNTER — Encounter: Payer: Self-pay | Admitting: *Deleted

## 2018-06-24 DIAGNOSIS — E113412 Type 2 diabetes mellitus with severe nonproliferative diabetic retinopathy with macular edema, left eye: Secondary | ICD-10-CM | POA: Diagnosis not present

## 2018-06-25 ENCOUNTER — Other Ambulatory Visit: Payer: Self-pay

## 2018-06-25 MED ORDER — INSULIN LISPRO 100 UNIT/ML ~~LOC~~ SOLN: SUBCUTANEOUS | 3 refills | Status: DC

## 2018-07-08 DIAGNOSIS — E113412 Type 2 diabetes mellitus with severe nonproliferative diabetic retinopathy with macular edema, left eye: Secondary | ICD-10-CM | POA: Diagnosis not present

## 2018-07-22 DIAGNOSIS — U071 COVID-19: Secondary | ICD-10-CM

## 2018-07-22 HISTORY — DX: COVID-19: U07.1

## 2018-07-28 DIAGNOSIS — I129 Hypertensive chronic kidney disease with stage 1 through stage 4 chronic kidney disease, or unspecified chronic kidney disease: Secondary | ICD-10-CM | POA: Diagnosis not present

## 2018-07-28 DIAGNOSIS — N2581 Secondary hyperparathyroidism of renal origin: Secondary | ICD-10-CM | POA: Diagnosis not present

## 2018-07-28 DIAGNOSIS — N184 Chronic kidney disease, stage 4 (severe): Secondary | ICD-10-CM | POA: Diagnosis not present

## 2018-07-31 ENCOUNTER — Encounter: Payer: Self-pay | Admitting: Family Medicine

## 2018-08-21 DIAGNOSIS — E113413 Type 2 diabetes mellitus with severe nonproliferative diabetic retinopathy with macular edema, bilateral: Secondary | ICD-10-CM | POA: Diagnosis not present

## 2018-09-23 ENCOUNTER — Ambulatory Visit (INDEPENDENT_AMBULATORY_CARE_PROVIDER_SITE_OTHER): Payer: 59 | Admitting: Family Medicine

## 2018-09-23 VITALS — BP 165/90 | HR 75 | Temp 97.9°F | Wt 300.6 lb

## 2018-09-23 DIAGNOSIS — R61 Generalized hyperhidrosis: Secondary | ICD-10-CM | POA: Diagnosis not present

## 2018-09-23 DIAGNOSIS — R0683 Snoring: Secondary | ICD-10-CM

## 2018-09-23 DIAGNOSIS — E1165 Type 2 diabetes mellitus with hyperglycemia: Secondary | ICD-10-CM

## 2018-09-23 DIAGNOSIS — I10 Essential (primary) hypertension: Secondary | ICD-10-CM

## 2018-09-23 DIAGNOSIS — N521 Erectile dysfunction due to diseases classified elsewhere: Secondary | ICD-10-CM

## 2018-09-23 DIAGNOSIS — E1169 Type 2 diabetes mellitus with other specified complication: Secondary | ICD-10-CM

## 2018-09-23 LAB — POCT GLYCOSYLATED HEMOGLOBIN (HGB A1C): HBA1C, POC (CONTROLLED DIABETIC RANGE): 8.2 % — AB (ref 0.0–7.0)

## 2018-09-23 MED ORDER — SILDENAFIL CITRATE 100 MG PO TABS: 50.0000 mg | ORAL_TABLET | Freq: Every day | ORAL | 0 refills | Status: DC | PRN

## 2018-09-23 NOTE — Progress Notes (Signed)
Subjective   Patient ID: Jeremy [name]    DOB: 09-21-1965, 53 y.o. male   MRN: [id number]  CC: "Night sweats"  HPI: Jeremy [name] is a 53 y.o. male who presents to clinic today for the following:  Unexplained diaphoresis: Jeremy [name] is here again for persistent remittent diaphoresis.  This occurs both in the daytime and the evening.  He has no history of fever.  This began approximately 4 months ago at which point he was seen here at the clinic with a normal CBC and no identifiable risk factors for infection.  He was instructed to follow-up in 2 weeks but his symptoms had improved.  Patient reports having return of symptoms approximately 1 month ago with more severe and persistent episodes primarily worse with eating.  He does report night sweats.  He feels that this may be related to hypothyroidism given his increased weight gain.  He also has a lack of energy including impotence.  He denies fevers or chills, shortness of breath, chest pain, lymph node swelling, history of trauma, recent travel, TB exposure.  ED: Ongoing issue for the last year.  Patient does not have difficulty achieving erection, but rather maintaining.  He does have a significant history significant for poorly controlled hypertension.  He has not trialed PDE-5 inhibitors.  He is not on nitrates for chest pain.  Resistant hypertension: As mentioned above, patient has been experiencing ongoing difficulty with sleep with poorly controlled hypertension despite adherence to medications.  He is currently on amlodipine, Coreg, enalapril, hydralazine, and Lasix.  He is already established with nephrology with stage IV chronic kidney disease.  ROS: see HPI for pertinent.  [place]: DM complicated by CKDIV and macular edema, ED, HTN, MDD, morbid obesity. Surgical history I&D extremity. Family history DM, heart disease. Smoking status reviewed. Medications reviewed.  Objective   BP (!) 165/90   Pulse 75   Temp 97.9 F (36.6 C) (Oral)    Wt (!) 300 lb 9.6 oz (136.4 kg)   SpO2 98%   BMI 45.71 kg/m  Vitals and nursing note reviewed.  General: non-tremulous, well nourished, well developed, NAD with non-toxic appearance HEENT: normocephalic, atraumatic, moist mucous membranes Neck: supple, non-tender without lymphadenopathy, no thyromegaly Cardiovascular: regular rate and rhythm without murmurs, rubs, or gallops Lungs: clear to auscultation bilaterally with normal work of breathing Abdomen: soft, non-tender, non-distended, normoactive bowel sounds Skin: warm, minimal diaphoresis localized to posterior neck, no rashes or lesions, cap refill < 2 seconds Extremities: warm and well perfused, normal tone, no edema  Assessment & Plan   Unexplained night sweats Uncertain etiology.  Will need to rule out infectious source.  Did have reassuring CBC during last visit.  Differential is broad and includes uncontrolled diabetes, thyroid disease, infection, endocrine abnormality.  He is a never smoker. - Obtaining CXR 2 view with QuantiFERON gold - Checking A1c, UA, CMAC, HIV, TSH, CRP, ESR and blood culture - Reviewed return precautions, RTC 2 weeks or sooner if needed  Resistant hypertension Has been very difficult to control.  On 5 different antihypertensives.  Comorbidities include poorly controlled diabetes and history of seizure along with CKD stage IV. - Referral placed for nocturnal polysomnogram - Continue amlodipine 10 mg daily, Coreg 6.25 mg twice daily, Lasix 80 mg twice daily, enalapril 5 mg daily, hydralazine 25 mg 3 times daily - Continue to follow-up with nephrology  Erectile dysfunction associated with type 2 diabetes mellitus (HCC) Chronic.  Likely related to poorly controlled hypertension and diabetes. - Given trial  with sildenafil  Orders Placed This Encounter  Procedures  . Culture, blood (single)  . Culture, blood (single)  . Urine Culture  . DG Chest 2 View    Standing Status:   Future    Standing  Expiration Date:   11/23/2019    Order Specific Question:   Reason for Exam (SYMPTOM  OR DIAGNOSIS REQUIRED)    Answer:   Night sweats    Order Specific Question:   Preferred imaging location?    Answer:   [facility]    Order Specific Question:   Radiology Contrast Protocol - do NOT remove file path    Answer:   [email or web address]  . Sedimentation Rate  . C-reactive protein  . TSH  . HIV antibody (with reflex)  . Comprehensive metabolic panel    Order Specific Question:   Has the patient fasted?    Answer:   No  . QuantiFERON-TB Gold Plus  . HgB A1c  . POCT urinalysis dipstick  . Nocturnal polysomnography (NPSG)    Standing Status:   Future    Standing Expiration Date:   09/23/2019    Order Specific Question:   Where should this test be performed:    Answer:   [facility] ordered this encounter  Medications  . sildenafil (VIAGRA) 100 MG tablet    Sig: Take 0.5-1 tablets (50-100 mg total) by mouth daily as needed for erectile dysfunction.    Dispense:  5 tablet    Refill:  0    Harriet Butte, [place], PGY-3 09/23/2018, 5:02 PM

## 2018-09-23 NOTE — Assessment & Plan Note (Signed)
Chronic.  Likely related to poorly controlled hypertension and diabetes. - Given trial with sildenafil

## 2018-09-23 NOTE — Patient Instructions (Signed)
Thank you for coming in to see [place] today. Please see below to review our plan for today's visit.  Please go to [facility] imaging at [facility] to have your x-ray done.  I will call you with the results.  Your other test will take a few days to resolve.  Please follow-up in 2 weeks.  Please call the clinic at [phone] if your symptoms worsen or you have any concerns. It was our pleasure to serve you.  Harriet Butte, [place], PGY-3

## 2018-09-23 NOTE — Assessment & Plan Note (Signed)
Has been very difficult to control.  On 5 different antihypertensives.  Comorbidities include poorly controlled diabetes and history of seizure along with CKD stage IV. - Referral placed for nocturnal polysomnogram - Continue amlodipine 10 mg daily, Coreg 6.25 mg twice daily, Lasix 80 mg twice daily, enalapril 5 mg daily, hydralazine 25 mg 3 times daily - Continue to follow-up with nephrology

## 2018-09-23 NOTE — Assessment & Plan Note (Signed)
Uncertain etiology.  Will need to rule out infectious source.  Did have reassuring CBC during last visit.  Differential is broad and includes uncontrolled diabetes, thyroid disease, infection, endocrine abnormality.  He is a never smoker. - Obtaining CXR 2 view with QuantiFERON gold - Checking A1c, UA, CMAC, HIV, TSH, CRP, ESR and blood culture - Reviewed return precautions, RTC 2 weeks or sooner if needed 

## 2018-09-24 ENCOUNTER — Ambulatory Visit
Admission: RE | Admit: 2018-09-24 | Discharge: 2018-09-24 | Disposition: A | Discharge status: Home / Self Care | Payer: 59 | Source: Ambulatory Visit | Attending: Family Medicine | Admitting: Family Medicine

## 2018-09-24 DIAGNOSIS — R61 Generalized hyperhidrosis: Secondary | ICD-10-CM | POA: Diagnosis not present

## 2018-09-24 LAB — POCT URINALYSIS DIP (MANUAL ENTRY)
BILIRUBIN UA: NEGATIVE mg/dL
Bilirubin, UA: NEGATIVE
Glucose, UA: NEGATIVE mg/dL
LEUKOCYTES UA: NEGATIVE
Nitrite, UA: NEGATIVE
PH UA: 5.5 (ref 5.0–8.0)
Protein Ur, POC: NEGATIVE mg/dL
RBC UA: NEGATIVE
SPEC GRAV UA: 1.01 (ref 1.010–1.025)
Urobilinogen, UA: 0.2 E.U./dL

## 2018-09-24 NOTE — Addendum Note (Signed)
Addended by: [place] Pink on: 09/24/2018 08:56 AM   Modules accepted: Orders

## 2018-09-25 ENCOUNTER — Telehealth: Payer: Self-pay | Admitting: Family Medicine

## 2018-09-25 NOTE — Telephone Encounter (Signed)
Error

## 2018-09-26 LAB — COMPREHENSIVE METABOLIC PANEL
A/G RATIO: 1.3 (ref 1.2–2.2)
ALT: 20 IU/L (ref 0–44)
AST: 17 IU/L (ref 0–40)
Albumin: 4.5 g/dL (ref 3.8–4.9)
Alkaline Phosphatase: 107 IU/L (ref 39–117)
BUN/Creatinine Ratio: 18 (ref 9–20)
BUN: 60 mg/dL — AB (ref 6–24)
Bilirubin Total: 0.2 mg/dL (ref 0.0–1.2)
CALCIUM: 9.2 mg/dL (ref 8.7–10.2)
CHLORIDE: 104 mmol/L (ref 96–106)
CO2: 23 mmol/L (ref 20–29)
Creatinine, Ser: 3.27 mg/dL — ABNORMAL HIGH (ref 0.76–1.27)
GFR calc Af Amer: 24 mL/min/{1.73_m2} — ABNORMAL LOW (ref >59)
GFR, EST NON AFRICAN AMERICAN: 21 mL/min/{1.73_m2} — AB (ref >59)
GLUCOSE: 124 mg/dL — AB (ref 65–99)
Globulin, Total: 3.6 g/dL (ref 1.5–4.5)
POTASSIUM: 4.9 mmol/L (ref 3.5–5.2)
Sodium: 142 mmol/L (ref 134–144)
Total Protein: 8.1 g/dL (ref 6.0–8.5)

## 2018-09-26 LAB — QUANTIFERON-TB GOLD PLUS
QUANTIFERON-TB GOLD PLUS: NEGATIVE
QuantiFERON Nil Value: 0.09 IU/mL
QuantiFERON TB1 Ag Value: 0.1 IU/mL
QuantiFERON TB2 Ag Value: 0.09 IU/mL

## 2018-09-26 LAB — TSH: TSH: 1.74 u[IU]/mL (ref 0.450–4.500)

## 2018-09-26 LAB — C-REACTIVE PROTEIN: CRP: 3 mg/L (ref 0–10)

## 2018-09-26 LAB — URINE CULTURE: Organism ID, Bacteria: NO GROWTH

## 2018-09-26 LAB — SEDIMENTATION RATE: Sed Rate: 1 mm/hr (ref 0–30)

## 2018-09-26 LAB — HIV ANTIBODY (ROUTINE TESTING W REFLEX): HIV SCREEN 4TH GENERATION: NONREACTIVE

## 2018-09-28 ENCOUNTER — Telehealth: Payer: Self-pay | Admitting: Family Medicine

## 2018-09-28 NOTE — Telephone Encounter (Signed)
Contacted Jeremy [name] regarding largely unremarkable lab work-up during office visit on 09/23/2022 intermittent diaphoresis.  His A1c did appear to be significantly elevated at 8.2 compared to prior 5 months ago 6.5.  Patient states he has not been using any of his diabetic meds for "quite some time."  When asked when he checks his sugars, patient says he "checked last week and it was in the 130s."  Patient did not check his glucose levels recently.  He does have a glucometer.  There have been no changes to his symptoms since his last visit.  I emphasized the importance of adherence to diabetic medications.  Patient had good understanding was agreeable with plan.  Advised him to monitor glucose levels both fasting and postprandial daily and bring them to his next visit on 10/07/2018.  Reviewed red flags and return precautions.  Harriet Butte, [place], PGY-3

## 2018-09-29 LAB — CULTURE, BLOOD (SINGLE)

## 2018-09-30 ENCOUNTER — Telehealth: Payer: Self-pay | Admitting: Family Medicine

## 2018-09-30 NOTE — Telephone Encounter (Signed)
Pt would like for Dr. Yisroel Ramming to call him. He would like to know if Dr. Yisroel Ramming could send him in an antibiotic for his cough as well.

## 2018-09-30 NOTE — Telephone Encounter (Signed)
Patient does not need antibiotic and can follow up with me as scheduled on 10/07/2018. If he has a fever, he needs to come in to be evaluated. He did not have signs of pneumonia on my exam last week. Please advise.  Harriet Butte, [place], PGY-3

## 2018-10-01 NOTE — Telephone Encounter (Signed)
Attempted to call patient to inform him that per Dr. Yisroel Ramming he does not need an antibiotic.  There was no answer or voicemail.  Ozella Almond, [place]

## 2018-10-03 ENCOUNTER — Other Ambulatory Visit: Payer: Self-pay | Admitting: Family Medicine

## 2018-10-07 ENCOUNTER — Other Ambulatory Visit: Payer: Self-pay

## 2018-10-07 ENCOUNTER — Ambulatory Visit (INDEPENDENT_AMBULATORY_CARE_PROVIDER_SITE_OTHER): Payer: 59 | Admitting: Family Medicine

## 2018-10-07 VITALS — BP 127/80 | HR 88 | Temp 98.6°F | Wt 309.0 lb

## 2018-10-07 DIAGNOSIS — E1121 Type 2 diabetes mellitus with diabetic nephropathy: Secondary | ICD-10-CM

## 2018-10-07 DIAGNOSIS — E1165 Type 2 diabetes mellitus with hyperglycemia: Secondary | ICD-10-CM

## 2018-10-07 DIAGNOSIS — R61 Generalized hyperhidrosis: Secondary | ICD-10-CM | POA: Diagnosis not present

## 2018-10-07 DIAGNOSIS — Z794 Long term (current) use of insulin: Secondary | ICD-10-CM

## 2018-10-07 DIAGNOSIS — IMO0002 Reserved for concepts with insufficient information to code with codable children: Secondary | ICD-10-CM

## 2018-10-07 DIAGNOSIS — G4733 Obstructive sleep apnea (adult) (pediatric): Secondary | ICD-10-CM | POA: Diagnosis not present

## 2018-10-07 DIAGNOSIS — R635 Abnormal weight gain: Secondary | ICD-10-CM | POA: Insufficient documentation

## 2018-10-07 NOTE — Progress Notes (Signed)
Subjective   Patient ID: Jeremy [name]    DOB: 1966-04-21, 53 y.o. male   MRN: [id number]  CC: "diaphoresis follow-up"  HPI: Jeremy [name] is a 53 y.o. male who presents to clinic today for the following:  Diaphoresis: Jeremy [name] is presenting for follow-up regarding intermittent unexplained diaphoresis over the last several months.  He feels that his symptoms have improved some but he does occasionally have an episode without known trigger.  This has improved since restarting all of his diabetic medications which she was nonadherent to during this time and was suspected to be the culprit.  He has underwent extensive work-up to rule out other sources with negative blood culture, ESR, CRP, TSH, HIV, QuantiFERON gold, urine culture.  He does report good adherence to his Ozempic weekly and Humalog sliding scale.  Increased weight and cough: Reports productive cough with white phlegm since his last visit.  This is not reported during his visit in February.  Patient believes this may be related to a viral infection but did not have any other accompanying symptoms.  He denies chest pain or shortness of breath.  He does have baseline lower extremity edema without change and is significantly obese with a history of CKD which makes his weight more difficult to follow.  He is up approximately 9 pounds from 1 month ago.    Sleep apnea: Has been nonadherent to his CPAP.  Seldom use.  Did use yesterday but cannot recall when he used it prior to this.  ROS: see HPI for pertinent.  [place]: DM complicated by CKDIVand macular edema, ED, HTN, MDD, morbid obesity. Surgical history I&D extremity. Family history DM, heart disease. Smoking status reviewed. Medications reviewed.  Objective   BP 127/80   Pulse 88   Temp 98.6 F (37 C) (Oral)   Wt (!) 309 lb (140.2 kg)   SpO2 97%   BMI 46.98 kg/m  Vitals and nursing note reviewed.  General: well nourished, well developed, NAD with non-toxic appearance HEENT:  normocephalic, atraumatic, moist mucous membranes Neck: supple, non-tender without lymphadenopathy, hepatojugular reflex present Cardiovascular: regular rate and rhythm without murmurs, rubs, or gallops Lungs: clear to auscultation bilaterally with normal work of breathing Abdomen: significantly obese making exam limited, normoactive bowel sounds, nontender Skin: warm, dry, no rashes or lesions, cap refill < 2 seconds Extremities: warm and well perfused, normal tone, 1+ pitting edema up to mid shin  Assessment & Plan   Uncontrolled type 2 diabetes mellitus with diabetic nephropathy, with long-term current use of insulin (HCC) Likely culprit for unexplained diaphoresis.  Now adherent per patient report. - Instructed to continue Ozempic and sliding scale Humalog - Next A1c due in 2 months  Unexplained night sweats Improving.  Does not appear to be infectious.  Likely related to poorly controlled diabetes given improvement since restarting metformin and Ozempic. - See plan for diabetes  Morbid obesity ([place]) Has increased weight gain of approximately 10 pounds over the last month.  Constellation of symptoms and signs concerning for insidious onset of fluid overload.  Difficult to ascertain if this is heart failure related given mild cardiomegaly on CXR, however patient is without cardiac symptoms.  He is adherent to his CPAP which is likely contributing.  In addition, he has significant CKD which will cause third spacing and make his weight more difficult to control.  Vital signs overall reassuring without hypoxia. - Instructed to increase Lasix to 80 mg daily for 1 week then decrease to 40 mg daily -  Reviewed return precautions, RTC 2 weeks  OSA (obstructive sleep apnea) Chronic.  Not adherent to CPAP. - Spent majority of the discussion concerning importance of CPAP adherence - See plan for morbid obesity  No orders of the defined types were placed in this encounter.  No orders of the  defined types were placed in this encounter.   Harriet Butte, [place], PGY-3 10/07/2018, 4:23 PM

## 2018-10-07 NOTE — Assessment & Plan Note (Addendum)
Likely culprit for unexplained diaphoresis.  Now adherent per patient report. - Instructed to continue Ozempic and sliding scale Humalog - Next A1c due in 2 months

## 2018-10-07 NOTE — Assessment & Plan Note (Signed)
Has increased weight gain of approximately 10 pounds over the last month.  Constellation of symptoms and signs concerning for insidious onset of fluid overload.  Difficult to ascertain if this is heart failure related given mild cardiomegaly on CXR, however patient is without cardiac symptoms.  He is adherent to his CPAP which is likely contributing.  In addition, he has significant CKD which will cause third spacing and make his weight more difficult to control.  Vital signs overall reassuring without hypoxia. - Instructed to increase Lasix to 80 mg daily for 1 week then decrease to 40 mg daily - Reviewed return precautions, RTC 2 weeks

## 2018-10-07 NOTE — Patient Instructions (Signed)
Thank you for coming in to see [place] today. Please see below to review our plan for today's visit.  Increase your Lasix to 80 mg daily for 1 week, then decrease to 40 mg daily.  Check your weights daily and keep a log and bring it to your next appointment.  I would like you to check your weight without any clothing on (usually before taking a shower) so that these weights are accurate.  Follow-up with me in 2 weeks. It is very important that you use her CPAP as this can make your blood pressure and heart worse.  Please call the clinic at [phone] if your symptoms worsen or you have any concerns. It was our pleasure to serve you.  Harriet Butte, [place], PGY-3

## 2018-10-07 NOTE — Assessment & Plan Note (Signed)
Chronic.  Not adherent to CPAP. - Spent majority of the discussion concerning importance of CPAP adherence - See plan for morbid obesity

## 2018-10-07 NOTE — Assessment & Plan Note (Signed)
Improving.  Does not appear to be infectious.  Likely related to poorly controlled diabetes given improvement since restarting metformin and Ozempic. - See plan for diabetes

## 2018-10-08 ENCOUNTER — Telehealth: Payer: Self-pay

## 2018-10-08 ENCOUNTER — Other Ambulatory Visit: Payer: Self-pay | Admitting: Family Medicine

## 2018-10-08 ENCOUNTER — Telehealth: Payer: Self-pay | Admitting: Family Medicine

## 2018-10-08 MED ORDER — FUROSEMIDE 80 MG PO TABS: 80.0000 mg | ORAL_TABLET | Freq: Two times a day (BID) | ORAL | 3 refills | Status: DC

## 2018-10-08 NOTE — Telephone Encounter (Signed)
Called back to clarify. Appears nephrology had him on 160 (2 tabs) BID and instructed him to decrease to 160/80 if LE edema improved. Sent refill and advised Mr. Geister to increased back to 160/160. Patient to f/u 10/23/2018. Does not need Lantus or refill on other medications.  Harriet Butte, [place], PGY-3

## 2018-10-08 NOTE — Telephone Encounter (Signed)
Please call patient to discuss medication changes from yesterday's office visit.  Patient called nurse line stating he needed a prescription for Lantus (not on med list) b/c dose was increased yesterday.  Upon chart review, patient was told to increase Lasix, not Lantus, to 80 mg daily x one week and then decrease to 40 mg daily.  Asked patient if he has Lasix (Furosemide). He states he has this and already takes 2 in the AM and one in PM.  Note to PCP to call patient to clarify.  Call back is [phone]

## 2018-10-08 NOTE — Telephone Encounter (Signed)
Called back to clarify. Appears nephrology had him on 160 (2 tabs) BID and instructed him to decrease to 160/80 if LE edema improved. Sent refill and advised Mr. Creary to increased back to 160/160. Patient to f/u 10/23/2018. Does not need Lantus or refill on other medications.  Harriet Butte, [place], PGY-3

## 2018-10-23 ENCOUNTER — Ambulatory Visit: Payer: 59 | Admitting: Family Medicine

## 2018-10-23 ENCOUNTER — Telehealth (INDEPENDENT_AMBULATORY_CARE_PROVIDER_SITE_OTHER): Payer: 59 | Admitting: Family Medicine

## 2018-10-23 ENCOUNTER — Other Ambulatory Visit: Payer: Self-pay

## 2018-10-23 DIAGNOSIS — E1165 Type 2 diabetes mellitus with hyperglycemia: Secondary | ICD-10-CM | POA: Diagnosis not present

## 2018-10-23 DIAGNOSIS — R600 Localized edema: Secondary | ICD-10-CM | POA: Diagnosis not present

## 2018-10-23 DIAGNOSIS — Z794 Long term (current) use of insulin: Secondary | ICD-10-CM | POA: Diagnosis not present

## 2018-10-23 DIAGNOSIS — E1121 Type 2 diabetes mellitus with diabetic nephropathy: Secondary | ICD-10-CM | POA: Diagnosis not present

## 2018-10-23 DIAGNOSIS — IMO0002 Reserved for concepts with insufficient information to code with codable children: Secondary | ICD-10-CM

## 2018-10-23 NOTE — Assessment & Plan Note (Signed)
Uncontrolled per last A1c however with improved home readings with decreased soda intake.  Continue Ozempic 0.5 mg weekly and sliding scale Humalog.  Counseled on dietary modifications, patient is pre-contemplative.  He will need A1c recheck in 1 month

## 2018-10-23 NOTE — Assessment & Plan Note (Signed)
Chronic.  Secondary to CKD.  Weights stable on Lasix 160 mg twice daily.  Continue current management.

## 2018-10-23 NOTE — Progress Notes (Signed)
[facility] Telemedicine Visit  Patient consented to have visit conducted via telephone.  Encounter participants: Patient: Jeremy [name]  Provider: Bufford Lope  Others (if applicable): none  Chief Complaint: Diabetes follow-up  HPI:  Patient states that he has been doing well since his last visit with his PCP.  He has been taking his Ozempic 0.5 mg weekly and sliding scale Humalog.  He continues to have occasional intermittent unexplained diaphoresis.  This is not worsened.  When he does have these episodes he has checked his sugars and they have not been low.  His home readings have been in the mid 100s.  The lowest he seen is 120 and the highest he seen has been in the 180s. He has noticed this improvement in his sugars after decreasing his soda intake.  He drinks full sugar sodas but is down to 2 a day.  He has been told multiple times that this is a large part of his underlying uncontrolled diabetes and fluid gain.  He cannot tolerate diet sodas due to taste and is not interested in decreasing or stopping his soda intake. He has been checking his weights regularly at home.  Will weigh himself without close first thing in the morning every day.  They have been mostly stable at 289, 294, 299 pounds.  He reports good compliance on his Lasix 160 mg twice daily.  He reports good urine output on this dose.   ROS: no CP, SOB  Pertinent PMHx: Uncontrolled diabetes with CKD, hypertension, morbid obesity  Exam:  Respiratory: Speaks in full sentences with unlabored breathing  Assessment/Plan:  Uncontrolled type 2 diabetes mellitus with diabetic nephropathy, with long-term current use of insulin ([place]) Uncontrolled per last A1c however with improved home readings with decreased soda intake.  Continue Ozempic 0.5 mg weekly and sliding scale Humalog.  Counseled on dietary modifications, patient is pre-contemplative.  He will need A1c recheck in 1 month  Bilateral lower  extremity edema Chronic.  Secondary to CKD.  Weights stable on Lasix 160 mg twice daily.  Continue current management.    Time spent on phone with patient: 8 minutes  Billing: yes  Bufford Lope, DO PGY-3, [facility] 10/23/2018 3:28 PM

## 2018-11-02 ENCOUNTER — Telehealth: Payer: Self-pay | Admitting: Family Medicine

## 2018-11-02 ENCOUNTER — Other Ambulatory Visit: Payer: Self-pay | Admitting: Family Medicine

## 2018-11-02 DIAGNOSIS — G4733 Obstructive sleep apnea (adult) (pediatric): Secondary | ICD-10-CM

## 2018-11-02 NOTE — Telephone Encounter (Signed)
Pt insurance will not cover the sleep study ordered. They will only cover a home sleep study. Please discuss with patient if they would want that and if they agree, please put in new orders so that it can be scheduled. jw

## 2018-11-02 NOTE — Telephone Encounter (Signed)
Order placed for home sleep study.  Jeremy [name], [place], PGY-3

## 2018-11-02 NOTE — Telephone Encounter (Signed)
To referral coordinator.  Jessica Fleeger, CMA  

## 2018-11-03 NOTE — Telephone Encounter (Signed)
New orders were placed. [facility] have been notified and they will call patient to schedule jw

## 2018-11-24 ENCOUNTER — Other Ambulatory Visit: Payer: Self-pay | Admitting: Internal Medicine

## 2018-11-24 DIAGNOSIS — I10 Essential (primary) hypertension: Secondary | ICD-10-CM

## 2018-12-01 ENCOUNTER — Other Ambulatory Visit: Payer: Self-pay

## 2018-12-01 MED ORDER — HYDRALAZINE HCL 25 MG PO TABS: 25.0000 mg | ORAL_TABLET | Freq: Three times a day (TID) | ORAL | 5 refills | Status: DC

## 2018-12-22 ENCOUNTER — Ambulatory Visit: Payer: 59 | Admitting: Family Medicine

## 2018-12-22 ENCOUNTER — Other Ambulatory Visit: Payer: Self-pay

## 2018-12-22 VITALS — BP 121/80 | HR 76

## 2018-12-22 DIAGNOSIS — G4733 Obstructive sleep apnea (adult) (pediatric): Secondary | ICD-10-CM

## 2018-12-22 DIAGNOSIS — G4701 Insomnia due to medical condition: Secondary | ICD-10-CM

## 2018-12-22 MED ORDER — ZOLPIDEM TARTRATE 5 MG PO TABS: 5.0000 mg | ORAL_TABLET | Freq: Every evening | ORAL | 0 refills | Status: DC | PRN

## 2018-12-22 NOTE — Progress Notes (Signed)
   Subjective   Patient ID: Jeremy [name]    DOB: May 18, 1966, 53 y.o. male   MRN: [id number]  CC: "Insomnia"  HPI: Jeremy [name] is a 53 y.o. male who presents to clinic today for the following:  Insomnia: Jeremy [name] has a history of snoring and daytime fatigue with concerns for OSA.  He was originally scheduled for a sleep study back in March but was discontinued due to the [place] pandemic.  He has an upcoming home sleep study scheduled on 01/04/2019.  As of recently, patient has been frustrated with his daytime fatigue while at work.  He usually goes to sleep around 10 PM but does not fall asleep until closer to midnight.  He reports drinking 1-2 sodas in the evening and usually watches TV.  Patient reports waking up usually around 1:00 to use the bathroom and will often go to the kitchen to drink water.  He is a diabetic with suboptimal control on insulin.  He also has history of poorly controlled blood pressure with nephropathy followed by [facility].  ROS: see HPI for pertinent.  [place]: Reviewed. Smoking status reviewed. Medications reviewed.  Objective   BP 121/80   Pulse 76   SpO2 97%  Vitals and nursing note reviewed.  General: obese, well nourished, well developed, NAD with non-toxic appearance HEENT: normocephalic, atraumatic, moist mucous membranes Cardiovascular: regular rate and rhythm without murmurs, rubs, or gallops Lungs: clear to auscultation bilaterally with normal work of breathing Skin: warm, dry, no rashes or lesions, cap refill < 2 seconds Extremities: warm and well perfused, normal tone, no edema  Assessment & Plan   OSA (obstructive sleep apnea) Likely source for insomnia.  Has upcoming sleep study.  Has poor habits which are contributing to his insomnia.  Patient very frustrated and wanting medication to help him with his sleep today.  After discussion, I am amenable to giving him a short term course of Ambien to get him through the next week or  so in hopes that we can get the sleep study performed. - Discussed preventative measures and given information on ways to improve sleep quality - Patient instructed to follow-up with scheduled sleep study and is willing to use CPAP, directed not to use Ambien on day of sleep study -Given prescription for Ambien 5 mg nightly, #15 tabs, no refills - RTC for diabetes in the next 2 weeks or sooner if needed  No orders of the defined types were placed in this encounter.  Meds ordered this encounter  Medications  . zolpidem (AMBIEN) 5 MG tablet    Sig: Take 1 tablet (5 mg total) by mouth at bedtime as needed for sleep.    Dispense:  15 tablet    Refill:  0    Harriet Butte, [place], PGY-3 12/22/2018, 3:22 PM

## 2018-12-22 NOTE — Assessment & Plan Note (Addendum)
Likely source for insomnia.  Has upcoming sleep study.  Has poor habits which are contributing to his insomnia.  Patient very frustrated and wanting medication to help him with his sleep today.  After discussion, I am amenable to giving him a short term course of Ambien to get him through the next week or so in hopes that we can get the sleep study performed. - Discussed preventative measures and given information on ways to improve sleep quality - Patient instructed to follow-up with scheduled sleep study and is willing to use CPAP, directed not to use Ambien on day of sleep study -Given prescription for Ambien 5 mg nightly, #15 tabs, no refills - RTC for diabetes in the next 2 weeks or sooner if needed

## 2018-12-22 NOTE — Patient Instructions (Addendum)
Thank you for coming in to see [place] today. Please see below to review our plan for today's visit.  Follow-up with your scheduled sleep study on 01/04/2019 5:30 PM.  It is critical that you complete this test so that we can get your CPAP set up.  This is the key to getting better sleep and better control of your medical problems.  I will give you a temporary prescription for Ambien which you can take nightly to help you with sleep.  Do not take this medication the night of your sleep study.  In addition to getting your sleep study done and using CPAP, follow the instructions below.  This means you should avoid all caffeine drinks including sodas in the evening.  Avoiding fluids a few hours prior to bedtime will help decrease your need to use the bathroom in the evening.  Insomnia is a common problem (affects 10% of the population). Sleep hygiene is key to helping restore your sleep. Here are some basic sleep hygiene tips:  No electronic devices or TV in bed  Keep bedroom dark with comfortable cool temperature  No caffeine after 2-4pm  No alcohol within 6 hours of bedtime  No nicotine prior to bedtime  Avoid heavy meal and lots of liquid right before bed  Avoid naps or keep them brief (<30 mins)  Avoid lots of physical activity before sleep  Remember bed is for the 2 S's (sleep and sex)  Make a routine (bath, reading a book, etc.) prior to bed  Set a consistent wake time for all 7 days of the week  You can supplement with mindfulness meditation like deep breathing, stretching, low-intensity yoga, relaxing music   Please call the clinic at [phone] if your symptoms worsen or you have any concerns. It was our pleasure to serve you.  Harriet Butte, [place], PGY-3

## 2018-12-27 ENCOUNTER — Other Ambulatory Visit: Payer: Self-pay | Admitting: Internal Medicine

## 2018-12-27 ENCOUNTER — Other Ambulatory Visit: Payer: Self-pay | Admitting: Family Medicine

## 2018-12-27 DIAGNOSIS — E1165 Type 2 diabetes mellitus with hyperglycemia: Secondary | ICD-10-CM

## 2019-01-04 ENCOUNTER — Encounter (HOSPITAL_BASED_OUTPATIENT_CLINIC_OR_DEPARTMENT_OTHER): Payer: 59

## 2019-01-13 ENCOUNTER — Ambulatory Visit: Payer: 59 | Admitting: Family Medicine

## 2019-01-13 ENCOUNTER — Other Ambulatory Visit: Payer: Self-pay

## 2019-01-13 VITALS — BP 160/80 | HR 90

## 2019-01-13 DIAGNOSIS — Z794 Long term (current) use of insulin: Secondary | ICD-10-CM | POA: Diagnosis not present

## 2019-01-13 DIAGNOSIS — E1121 Type 2 diabetes mellitus with diabetic nephropathy: Secondary | ICD-10-CM | POA: Diagnosis not present

## 2019-01-13 DIAGNOSIS — E1165 Type 2 diabetes mellitus with hyperglycemia: Secondary | ICD-10-CM | POA: Diagnosis not present

## 2019-01-13 DIAGNOSIS — R51 Headache: Secondary | ICD-10-CM

## 2019-01-13 DIAGNOSIS — IMO0002 Reserved for concepts with insufficient information to code with codable children: Secondary | ICD-10-CM

## 2019-01-13 DIAGNOSIS — R519 Headache, unspecified: Secondary | ICD-10-CM | POA: Insufficient documentation

## 2019-01-13 LAB — POCT GLYCOSYLATED HEMOGLOBIN (HGB A1C): HbA1c, POC (controlled diabetic range): 7.9 % — AB (ref 0.0–7.0)

## 2019-01-13 NOTE — Patient Instructions (Addendum)
Thank you for coming in to see [place] today. Please see below to review our plan for today's visit.  1.  Your pain seems to be more related to a headache.  Be sure to get adequate sleep which is approximately 7 hours per night and make sure you eat at least 3 meals a day.  You can take 650 mg of Tylenol every 6 hours (up to 4 g/day).  Avoid heavy caffeinated beverages as this can cause rebound headaches.  If your headache becomes more severe or frequent, or you have change in vision or focal weakness, call 911 immediately. 2.  You are overdue for your diabetic eye exam and should be evaluated soon.  Please call [facility] at [phone] and left them know what has been going on.  Please call the clinic at [phone] if your symptoms worsen or you have any concerns. It was our pleasure to serve you.  Harriet Butte, [place], PGY-3

## 2019-01-13 NOTE — Assessment & Plan Note (Signed)
Reassuring neuro exam.  No red flags or eye impairment.  Does have severe diabetic proliferative retinopathy with macular edema bilaterally and overdue for follow-up.  Suspect symptoms may be tension headache versus migrainous headache. - Tylenol 650 mg every 6 hours as needed and discussed conservative management including adequate sleep and diet - Given contact information instructed to contact [facility] for follow-up - RTC 1 week or sooner if needed - Reviewed return precautions

## 2019-01-13 NOTE — Progress Notes (Signed)
   Subjective   Patient ID: Jeremy [name]    DOB: 03/18/1966, 53 y.o. male   MRN: [id number]  CC: "Left eye pain"  HPI: Jeremy [name] is a 53 y.o. male who presents to clinic today for the following:  [place] symptoms started yesterday morning after waking (did not cause him to wake up). Eye involved: left Eye symptom progression: unchanged, intermitent Other people with same problem: no Medications tried: no Eye Trauma: no Contact Lens: no Recent eye surgeries: no  Symptoms Itching: no Eye discharge or mattering: no Vision impairment: no Photophobia: no Nose discharge: no Sneezing: no Vomiting: no Rings around lights:  no  ROS: see HPI for pertinent.  [place]: Reviewed. Smoking status reviewed. Medications reviewed.  Objective   BP (!) 160/80   Pulse 90   SpO2 98%  Vitals and nursing note reviewed.  General: well nourished, well developed, NAD with non-toxic appearance HEENT: normocephalic, atraumatic, moist mucous membranes, PERRLA, EOMI, pink conjunctivo-bilaterally without discharge, slight chronic ptosis of right eye Neck: supple, non-tender without lymphadenopathy Cardiovascular: regular rate and rhythm without murmurs, rubs, or gallops Lungs: clear to auscultation bilaterally with normal work of breathing Skin: warm, dry, no rashes or lesions Extremities: warm and well perfused, normal tone, no edema Neuro: CNII-XII intact, fine motor intact, no dysarthria  Assessment & Plan   Frontal headache Reassuring neuro exam.  No red flags or eye impairment.  Does have severe diabetic proliferative retinopathy with macular edema bilaterally and overdue for follow-up.  Suspect symptoms may be tension headache versus migrainous headache. - Tylenol 650 mg every 6 hours as needed and discussed conservative management including adequate sleep and diet - Given contact information instructed to contact [facility] for follow-up - RTC 1 week or sooner  if needed - Reviewed return precautions  Uncontrolled type 2 diabetes mellitus with diabetic nephropathy, with long-term current use of insulin (HCC) Suboptimal control.  Has severe retinopathy. - See plan for frontal headache - Continue current diabetes regimen - RTC 1 week  Orders Placed This Encounter  Procedures  . HgB A1c   No orders of the defined types were placed in this encounter.   Harriet Butte, [place], PGY-3 01/13/2019, 2:59 PM

## 2019-01-13 NOTE — Assessment & Plan Note (Signed)
Suboptimal control.  Has severe retinopathy. - See plan for frontal headache - Continue current diabetes regimen - RTC 1 week

## 2019-02-02 ENCOUNTER — Other Ambulatory Visit: Payer: Self-pay

## 2019-02-02 ENCOUNTER — Ambulatory Visit (INDEPENDENT_AMBULATORY_CARE_PROVIDER_SITE_OTHER): Payer: 59 | Admitting: Family Medicine

## 2019-02-02 ENCOUNTER — Encounter: Payer: Self-pay | Admitting: Family Medicine

## 2019-02-02 VITALS — BP 145/82 | HR 83 | Temp 98.5°F | Wt 297.0 lb

## 2019-02-02 DIAGNOSIS — E1121 Type 2 diabetes mellitus with diabetic nephropathy: Secondary | ICD-10-CM | POA: Diagnosis not present

## 2019-02-02 DIAGNOSIS — R569 Unspecified convulsions: Secondary | ICD-10-CM | POA: Diagnosis not present

## 2019-02-02 DIAGNOSIS — IMO0002 Reserved for concepts with insufficient information to code with codable children: Secondary | ICD-10-CM

## 2019-02-02 DIAGNOSIS — Z1211 Encounter for screening for malignant neoplasm of colon: Secondary | ICD-10-CM | POA: Diagnosis not present

## 2019-02-02 DIAGNOSIS — E1165 Type 2 diabetes mellitus with hyperglycemia: Secondary | ICD-10-CM | POA: Diagnosis not present

## 2019-02-02 DIAGNOSIS — Z794 Long term (current) use of insulin: Secondary | ICD-10-CM

## 2019-02-02 NOTE — Patient Instructions (Signed)
Thank you so much for coming in today. WE discussed your history of seizures and completed the forms for the DMV. Please be sure to follow up with your neurologist as scheduled and reach out to me if you have any recurrent symptoms. Please also be sure to follow up with me at your earliest convenience to follow up your diabetes and blood pressure.   Thank you againt.  Mina Marble, DO [facility]

## 2019-02-02 NOTE — Assessment & Plan Note (Addendum)
Patient to follow up in 1 month for diabetes follow up and repeat A1C. Reinforced importance of medication compliance and regular glucose checks.

## 2019-02-02 NOTE — Progress Notes (Signed)
   Subjective:   Patient ID: Jeremy [name]    DOB: April 14, 1966, 53 y.o. male   MRN: [id number]  Jeremy [name] is a 53 y.o. male with a history of seizures here to have forms completed for DMV.  History of Seizure:  Patient is here for completion of DMV paperwork.  Patient with a history of recurrent seizures requiring hospitalization on 07/2017. Patient followed by Dr. Mariea Stable and cleared as of March 2019. He was switched from 1000mg  BID to Keprra 1500mg  QD in 02/2018. Patient denies any recurrence of seizure like activity. Tolerating this dose well. Denies any visual deficits, confusion, loss of motor function, sensory loss, tremor, or syncope. 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 He is planning to see ophthalmologist following this encounter and Dr. Mariea Stable on 7/16.   T2DM: Notes he check blood sugar every other day. Does not take Humalog as prescribed, but does endorse compliance to Ozempic. Denies any polyuria, polydipsia or hypoglycemic episodes.  Health Maintenance: due for diabetic foot exam and colonoscopy  Review of Systems:  Per HPI.   [name], medications and smoking status reviewed.  Objective:   BP (!) 145/82   Pulse 83   Temp 98.5 F (36.9 C) (Oral)   Wt 297 lb (134.7 kg)   SpO2 97%   BMI 45.16 kg/m  Vitals and nursing note reviewed.  General: well nourished, well developed, in no acute distress with non-toxic appearance, sitting comfortably in exam chair HEENT: normocephalic, atraumatic CV: regular rate and rhythm without murmurs, rubs, or gallops, 1+ pitting edema bilaterally LE Lungs: clear to auscultation bilaterally with normal work of breathing Abdomen: soft, non-tender, non-distended, normoactive bowel sounds Skin: warm, dry Extremities: warm and well perfused  Assessment & Plan:   Seizure (HCC) Chronic, stable. Controlled with Keppra 1500mg  QD. DMV forms completed during encounter. Patient to follow up with neurologist Dr. Mariea Stable and  ophthalmologist as scheduled. Return precautions discussed. Patient verbalized understanding.   Uncontrolled type 2 diabetes mellitus with diabetic nephropathy, with long-term current use of insulin ([place]) Patient to follow up in 1 month for diabetes follow up and repeat A1C. Reinforced importance of medication compliance and regular glucose checks.    Health Maintenance: - Colonoscopy referral sent - Plan to perform diabetic foot exam at follow up visit  Orders Placed This Encounter  Procedures  . Ambulatory referral to Gastroenterology    Referral Priority:   Routine    Referral Type:   Consultation    Referral Reason:   Specialty Services Required    Number of Visits Requested:   [place], DO PGY-2, [place] [facility] 02/02/2019 10:56 PM

## 2019-02-02 NOTE — Assessment & Plan Note (Signed)
Chronic, stable. Controlled with Keppra 1500mg  QD. DMV forms completed during encounter. Patient to follow up with neurologist Dr. Mariea Stable and ophthalmologist as scheduled. Return precautions discussed. Patient verbalized understanding.

## 2019-02-04 ENCOUNTER — Encounter: Payer: Self-pay | Admitting: Adult Health

## 2019-02-04 ENCOUNTER — Other Ambulatory Visit: Payer: Self-pay

## 2019-02-04 ENCOUNTER — Ambulatory Visit (INDEPENDENT_AMBULATORY_CARE_PROVIDER_SITE_OTHER): Payer: 59 | Admitting: Adult Health

## 2019-02-04 VITALS — BP 150/80 | HR 75 | Temp 98.0°F | Ht 68.0 in | Wt 301.2 lb

## 2019-02-04 DIAGNOSIS — R569 Unspecified convulsions: Secondary | ICD-10-CM | POA: Diagnosis not present

## 2019-02-04 MED ORDER — LEVETIRACETAM ER 750 MG PO TB24: 1500.0000 mg | ORAL_TABLET | Freq: Every day | ORAL | 11 refills | Status: DC

## 2019-02-04 NOTE — Patient Instructions (Addendum)
Your Plan:  Continue Keppra- will call pharmacy to verify dose If your symptoms worsen or you develop new symptoms please let us know.    Thank you for coming to see [place] at [facility]. I hope we have been able to provide you high quality care today.  You may receive a patient satisfaction survey over the next few weeks. We would appreciate your feedback and comments so that we may continue to improve ourselves and the health of our patients.

## 2019-02-04 NOTE — Progress Notes (Signed)
I have read the note, and I agree with the clinical assessment and plan.  Richard A. Sater, MD, PhD, FAAN Certified in Neurology, Clinical Neurophysiology, Sleep Medicine, Pain Medicine and Neuroimaging  [facility] [address], [address] [place], [place] [address] [phone]  

## 2019-02-04 NOTE — Progress Notes (Signed)
PATIENT: Jeremy [name] DOB: May 19, 1966  REASON FOR VISIT: follow up HISTORY FROM: patient  HISTORY OF PRESENT ILLNESS: Today 02/04/19:  [name] is a 53 year old male with a history of seizures.  He returns today for follow-up.  He is currently on Keppra however he is unsure if he is on the extended release or the immediate release.  We will call his pharmacy to verify.  He reports he has not had any seizures.  He operates a Teacher, music without difficulty.  Denies any changes in his gait or balance.  No change in his mood or behavior.  He returns today for follow-up.  HISTORY 03/13/2018 [name] is a 53 year old man who had status epilepticus in early 2019.  He may also have had a couple seizures in the preceding days.  He was admitted and treated with fosphenytoin and Keppra.  He was discharged on Keppra 1000 mg twice a day.  At the initial visit.  I kept him on that dose.  He notes that if he takes the Keppra 1000 mg twice a day he feels off balance and mentally slowed.  Therefore, most days he just takes one dose.  He has not had any seizures.  He prefers to take medications once a day not twice a day.  He denies any new numbness, weakness.  The difficulties with balance only occur when he takes 2000 mg daily of Keppra.  He is sleeping well most nights.  His sugars are doing much better and his typical readings are in the low 100's now.    He also has hypertension. Elevated BP was noted today.  He has not taken his morning medications.  REVIEW OF SYSTEMS: Out of a complete 14 system review of symptoms, the patient complains only of the following symptoms, and all other reviewed systems are negative.  See HPI  ALLERGIES: No Known Allergies  HOME MEDICATIONS: Outpatient Medications Prior to Visit  Medication Sig Dispense Refill  . amLODipine (NORVASC) 10 MG tablet TAKE 1 TABLET BY MOUTH EVERY DAY 30 tablet 11  . aspirin EC 81 MG tablet Take 1 tablet (81 mg total) by mouth  daily. 90 tablet 3  . atorvastatin (LIPITOR) 40 MG tablet Take 1 tablet (40 mg total) by mouth daily. 90 tablet 3  . carvedilol (COREG) 6.25 MG tablet TAKE 1 TABLET (6.25 MG TOTAL) BY MOUTH 2 (TWO) TIMES DAILY WITH A MEAL 62 tablet 11  . enalapril (VASOTEC) 5 MG tablet TAKE 1 TABLET BY MOUTH EVERY DAY 90 tablet 0  . furosemide (LASIX) 80 MG tablet Take 1 tablet (80 mg total) by mouth 2 (two) times daily. 180 tablet 3  . glucose blood (TRUE METRIX BLOOD GLUCOSE TEST) test strip Check up to four times daily. 100 each 12  . HUMALOG KWIKPEN 100 UNIT/ML KwikPen INJECT 0-9 UNITS 3 TIMES DAILY AND AT BEDTIME WITH MEALS AS DIRECTED ON ATTACHED SHEET 15 pen 3  . hydrALAZINE (APRESOLINE) 25 MG tablet TAKE 1 TABLET BY MOUTH THREE TIMES A DAY 90 tablet 5  . hydrALAZINE (APRESOLINE) 25 MG tablet Take 1 tablet (25 mg total) by mouth 3 (three) times daily. 90 tablet 5  . insulin lispro (HUMALOG) 100 UNIT/ML injection Inject 0-9 Units into skin three times daily with meals. 10 mL 3  . Insulin Pen Needle (B-D ULTRAFINE III SHORT PEN) 31G X 8 MM MISC 1 application by Does not apply route 4 (four) times daily. 100 each 11  . levETIRAcetam (KEPPRA) 1000 MG  tablet Take 750 mg by mouth 2 (two) times daily.  11  . polyethylene glycol powder (GLYCOLAX/MIRALAX) powder Take 17 g (or 1 tablespoon) daily mixed in water. 255 g 0  . Semaglutide,0.25 or 0.5MG /DOS, (OZEMPIC, 0.25 OR 0.5 MG/DOSE,) 2 MG/1.5ML SOPN Inject 0.5 mg into the skin once a week. 4 pen 0  . sildenafil (VIAGRA) 100 MG tablet Take 0.5-1 tablets (50-100 mg total) by mouth daily as needed for erectile dysfunction. 5 tablet 0  . zolpidem (AMBIEN) 5 MG tablet Take 1 tablet (5 mg total) by mouth at bedtime as needed for sleep. 15 tablet 0   No facility-administered medications prior to visit.     PAST MEDICAL HISTORY: Past Medical History:  Diagnosis Date  . Abscess   . AKI (acute kidney injury) ([place]) 08/05/2017  . Bell's palsy   . CKD (chronic kidney  disease)    Dr. McDiarmind   . High cholesterol   . Hypertension   . Left shoulder pain 10/26/2013   S/p injection on 10/26/13   . Renal insufficiency 02/14/2014  . Seizures ([place]) 08/04/2017   "related to high blood sugars" (08/05/2017)  . Type II diabetes mellitus ([place])     PAST SURGICAL HISTORY: Past Surgical History:  Procedure Laterality Date  . Eyelid surgery Right   . I&D EXTREMITY  11/11/2011   Procedure: IRRIGATION AND DEBRIDEMENT EXTREMITY;  Surgeon: Merrie Roof, MD;  Location: [facility];  Service: General;  Laterality: Right;  I&D Right Thigh Abscess    FAMILY HISTORY: Family History  Problem Relation Age of Onset  . Diabetes Mother   . Heart disease Mother 33  . Diabetes Sister   . Diabetes Brother   . Diabetes Brother   . Cancer Maternal Uncle        type unknown  . Stroke Neg Hx     SOCIAL HISTORY: Social History   Socioeconomic History  . Marital status: Divorced    Spouse name: Not on file  . Number of children: 3  . Years of education: Not on file  . Highest education level: Not on file  Occupational History  . Occupation: Fish farm manager  Social Needs  . Financial resource strain: Not on file  . Food insecurity    Worry: Not on file    Inability: Not on file  . Transportation needs    Medical: Not on file    Non-medical: Not on file  Tobacco Use  . Smoking status: Never Smoker  . Smokeless tobacco: Never Used  Substance and Sexual Activity  . Alcohol use: Yes    Comment: rarely-once or twice a year  . Drug use: No  . Sexual activity: Yes  Lifestyle  . Physical activity    Days per week: Not on file    Minutes per session: Not on file  . Stress: Not on file  Relationships  . Social Herbalist on phone: Not on file    Gets together: Not on file    Attends religious service: Not on file    Active member of club or organization: Not on file    Attends meetings of clubs or organizations: Not on file    Relationship status: Not on  file  . Intimate partner violence    Fear of current or ex partner: Not on file    Emotionally abused: Not on file    Physically abused: Not on file    Forced sexual activity: Not on file  Other Topics Concern  . 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  Did not graduate high school.  Has 3 adult children in [place].  Divorced.  Lives with girlfriend [name].                      PHYSICAL EXAM  Vitals:   02/04/19 0806  BP: (!) 150/80  Pulse: 75  Temp: 98 F (36.7 C)  Weight: (!) 301 lb 3.2 oz (136.6 kg)  Height: 5\' 8"  (1.727 m)   Body mass index is 45.8 kg/m.  Generalized: Well developed, in no acute distress   Neurological examination  Mentation: Alert oriented to time, place, history taking. Follows all commands speech and language fluent Cranial nerve II-XII:  Extraocular movements were full, visual field were full on confrontational test. Head turning and shoulder shrug  were normal and symmetric. Motor: The motor testing reveals 5 over 5 strength of all 4 extremities. Good symmetric motor tone is noted throughout.  Sensory: Sensory testing is intact to soft touch on all 4 extremities. No evidence of extinction is noted.  Coordination: Cerebellar testing reveals good finger-nose-finger and heel-to-shin bilaterally.  Gait and station: Gait is normal. Tandem gait is normal. Romberg is negative. No drift is seen.  Reflexes: Deep tendon reflexes are symmetric but depressed throughout  DIAGNOSTIC DATA (LABS, IMAGING, TESTING) - I reviewed patient records, labs, notes, testing and imaging myself where available.  Lab Results  Component Value Date   WBC 7.4 06/12/2018   HGB 11.6 (L) 06/12/2018   HCT 36.3 (L) 06/12/2018   MCV 80 06/12/2018   PLT 240 06/12/2018      Component Value Date/Time   NA 142 09/23/2018 1547   K 4.9 09/23/2018 1547   CL 104 09/23/2018 1547   CO2 23 09/23/2018  1547   GLUCOSE 124 (H) 09/23/2018 1547   GLUCOSE 137 (H) 08/10/2017 0346   BUN 60 (H) 09/23/2018 1547   CREATININE 3.27 (H) 09/23/2018 1547   CREATININE 2.77 (H) 05/31/2016 0844   CALCIUM 9.2 09/23/2018 1547   PROT 8.1 09/23/2018 1547   ALBUMIN 4.5 09/23/2018 1547   AST 17 09/23/2018 1547   ALT 20 09/23/2018 1547   ALKPHOS 107 09/23/2018 1547   BILITOT 0.2 09/23/2018 1547   GFRNONAA 21 (L) 09/23/2018 1547   GFRNONAA 26 (L) 05/31/2016 0844   GFRAA 24 (L) 09/23/2018 1547   GFRAA 30 (L) 05/31/2016 0844   Lab Results  Component Value Date   CHOL 199 04/03/2018   HDL 48 04/03/2018   LDLCALC 129 (H) 04/03/2018   TRIG 112 04/03/2018   CHOLHDL 4.1 04/03/2018   Lab Results  Component Value Date   HGBA1C 7.9 (A) 01/13/2019   Lab Results  Component Value Date   VITAMINB12 412 11/26/2013   Lab Results  Component Value Date   TSH 1.740 09/23/2018      ASSESSMENT AND PLAN 53 y.o. year old male  has a past medical history of Abscess, AKI (acute kidney injury) ([place]) (08/05/2017), Bell's palsy, CKD (chronic kidney disease), High cholesterol, Hypertension, Left shoulder pain (10/26/2013), Renal insufficiency (02/14/2014), Seizures ([place]) (08/04/2017), and Type II diabetes mellitus ([place]). here with:  1.  Seizures  Overall the patient has done well.  The nurse called the pharmacy and verified that he is taking Keppra extended release 750 mg tablets-2 tablets daily.  I will reorder this for him today.  I have  advised that if he has any seizure events he should let us know.  He will follow-up in 1 year or sooner if needed   I spent 15 minutes with the patient. 50% of this time was spent discussing his medication and plan of care   Ward Givens, MSN, NP-C 02/04/2019, 8:36 AM [facility] [address] [place], [place] [address] [phone]

## 2019-02-08 ENCOUNTER — Telehealth: Payer: Self-pay | Admitting: *Deleted

## 2019-02-08 NOTE — Telephone Encounter (Signed)
Received DMV form today for pt.  I completed and to MM/NP for review and signature.

## 2019-02-08 NOTE — Telephone Encounter (Signed)
signed

## 2019-02-08 NOTE — Telephone Encounter (Signed)
Signed.  To MR.  

## 2019-02-08 NOTE — Telephone Encounter (Signed)
I faxed pt form [facility] on 02/08/19.

## 2019-02-22 ENCOUNTER — Ambulatory Visit: Payer: 59 | Admitting: Family Medicine

## 2019-03-01 ENCOUNTER — Encounter: Payer: Self-pay | Admitting: Internal Medicine

## 2019-03-10 ENCOUNTER — Encounter: Payer: Self-pay | Admitting: Family Medicine

## 2019-03-10 ENCOUNTER — Other Ambulatory Visit: Payer: Self-pay

## 2019-03-10 ENCOUNTER — Ambulatory Visit (INDEPENDENT_AMBULATORY_CARE_PROVIDER_SITE_OTHER): Payer: 59 | Admitting: Family Medicine

## 2019-03-10 VITALS — BP 152/82 | HR 79 | Wt 302.4 lb

## 2019-03-10 DIAGNOSIS — IMO0002 Reserved for concepts with insufficient information to code with codable children: Secondary | ICD-10-CM

## 2019-03-10 DIAGNOSIS — I10 Essential (primary) hypertension: Secondary | ICD-10-CM | POA: Diagnosis not present

## 2019-03-10 DIAGNOSIS — E1121 Type 2 diabetes mellitus with diabetic nephropathy: Secondary | ICD-10-CM

## 2019-03-10 DIAGNOSIS — E1165 Type 2 diabetes mellitus with hyperglycemia: Secondary | ICD-10-CM

## 2019-03-10 DIAGNOSIS — N184 Chronic kidney disease, stage 4 (severe): Secondary | ICD-10-CM

## 2019-03-10 DIAGNOSIS — R600 Localized edema: Secondary | ICD-10-CM

## 2019-03-10 DIAGNOSIS — Z794 Long term (current) use of insulin: Secondary | ICD-10-CM

## 2019-03-10 LAB — POCT GLYCOSYLATED HEMOGLOBIN (HGB A1C): HbA1c, POC (controlled diabetic range): 7.7 % — AB (ref 0.0–7.0)

## 2019-03-10 MED ORDER — OZEMPIC (0.25 OR 0.5 MG/DOSE) 2 MG/1.5ML ~~LOC~~ SOPN: 0.5000 mg | PEN_INJECTOR | SUBCUTANEOUS | 0 refills | Status: DC

## 2019-03-10 NOTE — Progress Notes (Signed)
Subjective:   Patient ID: Jeremy [name]    DOB: 08/02/1965, 53 y.o. male   MRN: [id number]  Jeremy [name] is a 53 y.o. male with a history of HTN, OSA, ED, HLD,  Uncontrolled T2DM, CKD IV, morbid obesity, seizure here for diabetes follow up.  T2DM: Last A1C 7.9. Patient currently on Semaglutide 0.5mg  q weekly and Humalog TID with meals. Patient endorses compliance. CBGs 120-190. Denies any polyuria, polydipsia, polyphagia. Denies any hypoglycemia.   HTN: BP 162/82. Repeat 152/82. Patient currently on Norvasc 10mg  QD, Coreg 6.25mg  BID, Enalapril 5mg  QD, and hydralazine 25mg  TID. Patient notes just taking his BP medicines. He denies any chest pain, SOB, vision changes or headaches.  Bilateral LE Edema: Currently taking Lasix 80mg  BID unless blood pressure is low then he doesn't take it. He notes his edema is usually good in the mornings and worse in the evenings after being on his feet all day. Improves with lasix and elevation. Has been using compression stockings occasionally as well.  Health Maintenance: Due for colonoscopy and foot exam. Colonoscopy referral placed at last appointment. Patient notes he has it scheduled for the end of this month.   Review of Systems:  Per HPI.   [place], medications and smoking status reviewed.  Objective:   BP (!) 152/82   Pulse 79   Wt (!) 302 lb 6.4 oz (137.2 kg)   SpO2 99%   BMI 45.98 kg/m  Vitals and nursing note reviewed.  General: well nourished, well developed, in no acute distress with non-toxic appearance, sitting comfortably in exam chair  CV: regular rate and rhythm without murmurs, rubs, or gallops, no lower extremity edema on exam today Lungs: clear to auscultation bilaterally with normal work of breathing Abdomen: soft, non-tender, non-distended, normoactive bowel sounds Skin: warm, dry Extremities: warm and well perfused Neuro: Alert and oriented, speech normal  Diabetic foot exam: 2+ DP pulses bilat, normal monofilament  testing bilaterally. No lesions or significant calluses.  Assessment & Plan:   Resistant hypertension Elevated blood pressure during exam today, however expect this may be that patient was late to take his medications this AM. Some improvement by end of exam. Currently asymptomatic.  - continue Amlodipine 10mg  QD, Coreg 6.25mg  BID, Lasix 80mg  BID, and Enalapril 5mg  QD - recommend monitoring blood pressure at home, call clinic if BP persistently elevated >140/90 or if develops chest pain, SOB, vision changes  - given CKD stage 4, encouraged good control of blood pressure in order to preserve remaining kidney function. If blood pressure becomes elevated at home, patient instructed to RTC sooner to have meds adjusted - Patient understood and agreed to plan   Uncontrolled type 2 diabetes mellitus with diabetic nephropathy, with long-term current use of insulin (HCC) A1C 7.7, improved from 7.9. No adverse effects with diabetes medications. Congratulated him on improvement in A1C. Counseled on importance on continued compliance and life-style modifications. Discussed severity of kidney disease and importance of good diabetes control in order to preserve remaining kidney function. He understood. He will return in 3-6 months for A1C check, sooner if blood sugars are not being well controlled with current regimen.  - continue Semaglutide 0.5mg  q weekly and sliding scale Humalog TID with meals - monitor blood sugars TID and keep log - due to changes in insurance, patient will RTC in 3-6 months for A1C check  Chronic kidney disease (CKD), stage IV (severe) (HCC) Stable. Discussed these results with patient. Patient is not interested in dialysis.  Recommended patient reach  out to nephrologist given he has not seen one in some time. Stressed importance of good blood pressure and diabetes control in order to preserve remaining kidney function. Patient understood and agreed to plan.  - RTC in 3-6 months for  repeat BMP  - continue DM and HTN management as above  Bilateral lower extremity edema Chronic. Secondary to CKD IV. Weight stable today. Euvolemic on exam today. Well controlled with Lasix 80mg  BID.  - continue current management  Health Maintenance: Foot exam performed today. WNL Colonoscopy scheduled for this month  Orders Placed This Encounter  Procedures  . Basic Metabolic Panel  . HgB A1c   Meds ordered this encounter  Medications  . Semaglutide,0.25 or 0.5MG /DOS, (OZEMPIC, 0.25 OR 0.5 MG/DOSE,) 2 MG/1.5ML SOPN    Sig: Inject 0.5 mg into the skin once a week.    Dispense:  4 pen    Refill:  0    DX Code Needed  .   Per patient request, will return in 6 months due to insurance difficulties with work. He will need time to get new insurance activated prior to return. I felt this was acceptable given improvement in diabetes.  Mina Marble, DO PGY-2, [facility] 03/13/2019 1:01 PM

## 2019-03-10 NOTE — Patient Instructions (Addendum)
Thank you so much for coming in to see me! You A1C is improved to 7.7 today! That is fantastic! Keep up the good work.   Today I got some labs. I will call you if anything is abnormal.   Please return in 3-6 months for diabetes follow up.  Take care,  Dr. Tarry Kos

## 2019-03-11 LAB — BASIC METABOLIC PANEL
BUN/Creatinine Ratio: 13 (ref 9–20)
BUN: 45 mg/dL — ABNORMAL HIGH (ref 6–24)
CO2: 21 mmol/L (ref 20–29)
Calcium: 8.7 mg/dL (ref 8.7–10.2)
Chloride: 106 mmol/L (ref 96–106)
Creatinine, Ser: 3.38 mg/dL — ABNORMAL HIGH (ref 0.76–1.27)
GFR calc Af Amer: 23 mL/min/{1.73_m2} — ABNORMAL LOW (ref >59)
GFR calc non Af Amer: 20 mL/min/{1.73_m2} — ABNORMAL LOW (ref >59)
Glucose: 150 mg/dL — ABNORMAL HIGH (ref 65–99)
Potassium: 4.4 mmol/L (ref 3.5–5.2)
Sodium: 141 mmol/L (ref 134–144)

## 2019-03-11 NOTE — Assessment & Plan Note (Addendum)
Elevated blood pressure during exam today, however expect this may be that patient was late to take his medications this AM. Some improvement by end of exam. Currently asymptomatic.  - continue Amlodipine 10mg  QD, Coreg 6.25mg  BID, Lasix 80mg  BID, and Enalapril 5mg  QD - recommend monitoring blood pressure at home, call clinic if BP persistently elevated >140/90 or if develops chest pain, SOB, vision changes  - given CKD stage 4, encouraged good control of blood pressure in order to preserve remaining kidney function. If blood pressure becomes elevated at home, patient instructed to RTC sooner to have meds adjusted - Patient understood and agreed to plan

## 2019-03-13 NOTE — Assessment & Plan Note (Addendum)
A1C 7.7, improved from 7.9. No adverse effects with diabetes medications. Congratulated him on improvement in A1C. Counseled on importance on continued compliance and life-style modifications. Discussed severity of kidney disease and importance of good diabetes control in order to preserve remaining kidney function. He understood. He will return in 3-6 months for A1C check, sooner if blood sugars are not being well controlled with current regimen.  - continue Semaglutide 0.5mg  q weekly and sliding scale Humalog TID with meals - monitor blood sugars TID and keep log - due to changes in insurance, patient will RTC in 3-6 months for A1C check

## 2019-03-13 NOTE — Assessment & Plan Note (Addendum)
Stable. Discussed these results with patient. Patient is not interested in dialysis.  Recommended patient reach out to nephrologist given he has not seen one in some time. Stressed importance of good blood pressure and diabetes control in order to preserve remaining kidney function. Patient understood and agreed to plan.  - RTC in 3-6 months for repeat BMP  - continue DM and HTN management as above

## 2019-03-13 NOTE — Assessment & Plan Note (Addendum)
Chronic. Secondary to CKD IV. Weight stable today. Euvolemic on exam today. Well controlled with Lasix 80mg  BID.  - continue current management

## 2019-03-16 ENCOUNTER — Ambulatory Visit: Payer: 59 | Admitting: Adult Health

## 2019-04-01 ENCOUNTER — Encounter: Payer: 59 | Admitting: Internal Medicine

## 2019-04-21 ENCOUNTER — Other Ambulatory Visit: Payer: Self-pay | Admitting: Student in an Organized Health Care Education/Training Program

## 2019-05-25 ENCOUNTER — Other Ambulatory Visit: Payer: Self-pay

## 2019-05-25 ENCOUNTER — Ambulatory Visit (INDEPENDENT_AMBULATORY_CARE_PROVIDER_SITE_OTHER): Payer: BC Managed Care – PPO | Admitting: Family Medicine

## 2019-05-25 DIAGNOSIS — E1165 Type 2 diabetes mellitus with hyperglycemia: Secondary | ICD-10-CM | POA: Diagnosis not present

## 2019-05-25 DIAGNOSIS — Z794 Long term (current) use of insulin: Secondary | ICD-10-CM

## 2019-05-25 DIAGNOSIS — Z23 Encounter for immunization: Secondary | ICD-10-CM

## 2019-05-25 DIAGNOSIS — E1121 Type 2 diabetes mellitus with diabetic nephropathy: Secondary | ICD-10-CM | POA: Diagnosis not present

## 2019-05-25 DIAGNOSIS — IMO0002 Reserved for concepts with insufficient information to code with codable children: Secondary | ICD-10-CM

## 2019-05-25 DIAGNOSIS — H9313 Tinnitus, bilateral: Secondary | ICD-10-CM | POA: Diagnosis not present

## 2019-05-25 MED ORDER — OZEMPIC (0.25 OR 0.5 MG/DOSE) 2 MG/1.5ML ~~LOC~~ SOPN: 0.5000 mg | PEN_INJECTOR | SUBCUTANEOUS | 1 refills | Status: DC

## 2019-05-25 NOTE — Patient Instructions (Signed)
Tinnitus Tinnitus refers to hearing a sound when there is no actual source for that sound. This is often described as ringing in the ears. However, people with this condition may hear a variety of noises, in one ear or in both ears. The sounds of tinnitus can be soft, loud, or somewhere in between. Tinnitus can last for a few seconds or can be constant for days. It may go away without treatment and come back at various times. When tinnitus is constant or happens often, it can lead to other problems, such as trouble sleeping and trouble concentrating. Almost everyone experiences tinnitus at some point. Tinnitus that is long-lasting (chronic) or comes back often (recurs) may require medical attention. What are the causes? The cause of tinnitus is often not known. In some cases, it can result from other problems or conditions, including:  Exposure to loud noises from machinery, music, or other sources.  Hearing loss.  Ear or sinus infections.  Earwax buildup.  An object (foreign body) stuck in the ear.  Taking certain medicines.  Drinking alcohol or caffeine.  High blood pressure.  Heart diseases.  Anemia.  Allergies.  Meniere's disease.  Thyroid problems.  Tumors.  A weak, bulging blood vessel (aneurysm) near the ear.  Depression or other mood disorders. What are the signs or symptoms? The main symptom of tinnitus is hearing a sound when there is no source for that sound. It may sound like:  Buzzing.  Roaring.  Ringing.  Blowing air, like the sound heard when you listen to a seashell.  Hissing.  Whistling.  Sizzling.  Humming.  Running water.  A musical note.  Tapping. Symptoms may affect only one ear (unilateral) or both ears (bilateral). How is this diagnosed? Tinnitus is diagnosed based on your symptoms, your medical history, and a physical exam. Your health care provider may do a thorough hearing test (audiologic exam) if your tinnitus:  Is  unilateral.  Causes hearing difficulties.  Lasts 6 months or longer. You may work with a health care provider who specializes in hearing disorders (audiologist). You may be asked questions about your symptoms and how they affect your daily life. You may have other tests done, such as:  CT scan.  MRI.  An imaging test of how blood flows through your blood vessels (angiogram). How is this treated? Treating an underlying medical condition can sometimes make tinnitus go away. If your tinnitus continues, other treatments may include:  Medicines, such as antidepressants or sleeping aids.  Sound generators to mask the tinnitus. These include: ? Tabletop sound machines that play relaxing sounds to help you fall asleep. ? Wearable devices that fit in your ear and play sounds or music. ? Acoustic neural stimulation. This involves using headphones to listen to music that contains an auditory signal. Over time, listening to this signal may change some pathways in your brain and make you less sensitive to tinnitus. This treatment is used for very severe cases when no other treatment is working.  Therapy and counseling to help you manage the stress of living with tinnitus.  Using hearing aids or cochlear implants if your tinnitus is related to hearing loss. Hearing aids are worn in the outer ear. Cochlear implants are surgically placed in the inner ear. Follow these instructions at home: Managing symptoms      When possible, avoid being in loud places and being exposed to loud sounds.  Wear hearing protection, such as earplugs, when you are exposed to loud noises.  Use   a white noise machine, a humidifier, or other devices to mask the sound of tinnitus.  Practice techniques for reducing stress, such as meditation, yoga, or deep breathing. Work with your health care provider if you need help with managing stress.  Sleep with your head slightly raised. This may reduce the impact of tinnitus.  General instructions  Do not use stimulants, such as nicotine, alcohol, or caffeine. Talk with your health care provider about other stimulants to avoid. Stimulants are substances that can make you feel alert and attentive by increasing certain activities in the body (such as heart rate and blood pressure). These substances may make tinnitus worse.  Take over-the-counter and prescription medicines only as told by your health care provider.  Try to get plenty of sleep each night.  Keep all follow-up visits as told by your health care provider. This is important. Contact a health care provider if:  Your tinnitus continues for 3 weeks or longer without stopping.  Your symptoms get worse or do not get better with home care.  You develop tinnitus after a head injury.  You have tinnitus along with any of the following: ? Dizziness. ? Loss of balance. ? Nausea and vomiting. Summary  Tinnitus refers to hearing a sound when there is no actual source for that sound. This is often described as ringing in the ears.  Symptoms may affect only one ear (unilateral) or both ears (bilateral).  Use a white noise machine, a humidifier, or other devices to mask the sound of tinnitus.  Do not use stimulants, such as nicotine, alcohol, or caffeine. Talk with your health care provider about other stimulants to avoid. These substances may make tinnitus worse. This information is not intended to replace advice given to you by your health care provider. Make sure you discuss any questions you have with your health care provider. Document Released: 07/08/2005 Document Revised: 06/20/2017 Document Reviewed: 04/17/2017 Elsevier Patient Education  2020 [employer]  

## 2019-05-25 NOTE — Progress Notes (Signed)
     Subjective: Chief Complaint  Patient presents with  . Tinnitus    right x 2 weeks  . Medication Refill  . Flu Vaccine    HPI: Jeremy [name] is a 53 y.o. presenting to clinic today to discuss the following:  Ringing in Ears Patient is a 53y/o male with PMH of T2DM, CKD stage IV, and morbid obesity that presents today for bilateral "ringing" in his ears. He states he noticed it first several weeks ago and mostly at night time when he goes to lay down. He has had no ear pain, no discharge, no noticeable hearing loss, and vertigo or dizziness, no loss of balance, no fevers, chills, or other viral symptoms.     ROS noted in HPI.    Social History   Tobacco Use  Smoking Status Never Smoker  Smokeless Tobacco Never Used    Objective: BP 137/80   Pulse 75   Temp 98.8 F (37.1 C) (Axillary)   Wt (!) 302 lb 12.8 oz (137.3 kg)   SpO2 98%   BMI 46.04 kg/m  Vitals and nursing notes reviewed  Physical Exam Gen: Alert and Oriented x 3, NAD HEENT: Normocephalic, atraumatic, PERRLA, EOMI, TM visible with good light reflex, non-swollen, non-erythematous turbinates, non-erythematous pharyngeal mucosa, no exudates Ext: no clubbing, cyanosis, or edema  Neuro: No gross deficits Skin: warm, dry, intact, no rashes  Assessment/Plan:  Tinnitus Patient experiencing ringing in ears without pain, discharge, or noticeable hearing loss. No vision changes, dizziness, vertigo, or balance issues. Most likely tinnitus. - I did advise patient to have an audiology exam to check for hearing loss but patient will wait one week and see if anything changes. He said he will call back if he desires to have it scheduled. - Gave patient handout on ways to deal with tinnitus    PATIENT EDUCATION PROVIDED: See AVS    Diagnosis and plan along with any newly prescribed medication(s) were discussed in detail with this patient today. The patient verbalized understanding and agreed with the plan. Patient  advised if symptoms worsen return to clinic or ER.    Orders Placed This Encounter  Procedures  . Flu Vaccine QUAD 36+ mos IM    Meds ordered this encounter  Medications  . Semaglutide,0.25 or 0.5MG /DOS, (OZEMPIC, 0.25 OR 0.5 MG/DOSE,) 2 MG/1.5ML SOPN    Sig: Inject 0.5 mg into the skin once a week.    Dispense:  4 pen    Refill:  1    DX Code Needed  .     Harolyn Rutherford, DO 05/25/2019, 3:47 PM PGY-3 [place]

## 2019-05-26 ENCOUNTER — Telehealth: Payer: Self-pay | Admitting: *Deleted

## 2019-05-26 ENCOUNTER — Telehealth: Payer: Self-pay | Admitting: Pharmacist

## 2019-05-26 DIAGNOSIS — H9319 Tinnitus, unspecified ear: Secondary | ICD-10-CM | POA: Insufficient documentation

## 2019-05-26 DIAGNOSIS — E1121 Type 2 diabetes mellitus with diabetic nephropathy: Secondary | ICD-10-CM

## 2019-05-26 DIAGNOSIS — E1165 Type 2 diabetes mellitus with hyperglycemia: Secondary | ICD-10-CM

## 2019-05-26 DIAGNOSIS — IMO0002 Reserved for concepts with insufficient information to code with codable children: Secondary | ICD-10-CM

## 2019-05-26 MED ORDER — TRULICITY 0.75 MG/0.5ML ~~LOC~~ SOAJ: 0.7500 mg | SUBCUTANEOUS | 1 refills | Status: DC

## 2019-05-26 MED ORDER — LIRAGLUTIDE 18 MG/3ML ~~LOC~~ SOPN: 0.6000 mg | PEN_INJECTOR | Freq: Every day | SUBCUTANEOUS | 3 refills | Status: DC

## 2019-05-26 NOTE — Telephone Encounter (Signed)
Pt calls and states that his ozempic is going to cost him $900 and he cant afford this.    Contacted pharmacy and it is not requiring a PA.  He recently switched to [facility] and pharmacy thinks that maybe this is his deductible.   Called pt to inform.  Will route to Dr. Valentina Lucks and PCP. Christen Bame, CMA

## 2019-05-26 NOTE — Assessment & Plan Note (Signed)
Patient experiencing ringing in ears without pain, discharge, or noticeable hearing loss. No vision changes, dizziness, vertigo, or balance issues. Most likely tinnitus. - I did advise patient to have an audiology exam to check for hearing loss but patient will wait one week and see if anything changes. He said he will call back if he desires to have it scheduled. - Gave patient handout on ways to deal with tinnitus

## 2019-05-26 NOTE — Telephone Encounter (Signed)
Patient called and reported recently prescribed Trulicity was still too expensive (~$200) for his financial situation.    We discussed options and decided to try Victoza - daily injection of 0.6mg  was discussed.   New Rx sent.   Asked patient to let us know if cost for this daily medication was affordable.  Plan follow-up by phone in 1 week to assist with dose titration if he can afford this agent.

## 2019-05-26 NOTE — Telephone Encounter (Signed)
Phone call to patient RE GLP - Ozempic Cost / Switch therapy.  Patient contacted by Sherren Kerns, PharmD - discussed switch from [place] to Trulicity once weekly GLP.  Discussed hypoglycemia management and blood glucose goals with patient patient. Reminded patient to have pharmacist provide instruction on use.    New Rx sent to [facility]

## 2019-05-26 NOTE — Telephone Encounter (Signed)
Noted and agree. 

## 2019-06-03 DIAGNOSIS — E113312 Type 2 diabetes mellitus with moderate nonproliferative diabetic retinopathy with macular edema, left eye: Secondary | ICD-10-CM | POA: Diagnosis not present

## 2019-07-27 ENCOUNTER — Other Ambulatory Visit: Payer: Self-pay

## 2019-07-27 ENCOUNTER — Ambulatory Visit
Admission: RE | Admit: 2019-07-27 | Discharge: 2019-07-27 | Disposition: A | Discharge status: Home / Self Care | Payer: BC Managed Care – PPO | Source: Ambulatory Visit | Attending: Family Medicine | Admitting: Family Medicine

## 2019-07-27 ENCOUNTER — Ambulatory Visit (INDEPENDENT_AMBULATORY_CARE_PROVIDER_SITE_OTHER): Payer: BC Managed Care – PPO | Admitting: Family Medicine

## 2019-07-27 VITALS — BP 150/82 | HR 78 | Wt 315.4 lb

## 2019-07-27 DIAGNOSIS — M25571 Pain in right ankle and joints of right foot: Secondary | ICD-10-CM

## 2019-07-27 NOTE — Patient Instructions (Addendum)
Based on your history and physical exam, I think your foot pain could be related to a stress fracture of your ankle.  We will get some x-rays to see if that is the case.  If these x-rays are unremarkable, that would make it more likely that this is just a soft tissue injury (as a muscle or tendon problem that will likely improve with time).  For now, please use the soft brace that we gave you in clinic, take Tylenol for pain control and have this x-rays done at your earliest convenience.  I will call you to follow-up on those x-rays.

## 2019-07-27 NOTE — Progress Notes (Signed)
    Subjective:  Jeremy [name] is a 54 y.o. male who presents to the [facility] today with a chief complaint of right foot pain for 1 month.   HPI:  Lateral malleolus tenderness Mr. Grzywacz reports that he has been having right ankle pain that started about 1 month ago.  He does not remember any traumatic event.  He reports some mild achy pain or dull pain starting about 1 month ago but did not bother him much.  In the past month it has gradually worsened.  For the past 3 days, it has been incredibly painful and he said a lot of trouble walking.  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  He notices that this pain is worse when he arises from a seated position put the weight on his ankle.  He often has to pause for a minute or so before he can stand and walk.  Chief Complaint noted Review of Symptoms - see HPI PMH - Smoking status noted.    Objective:  Physical Exam: BP (!) 150/82   Pulse 78   Wt (!) 315 lb 6.4 oz (143.1 kg)   SpO2 98%   BMI 47.96 kg/m    MSK:  Right ankle: Appearance: No bony abnormalities, mild edema noted in lower extremities bilaterally.  Mild swelling noted over the right ankle. Inspection: Significant tenderness to palpation worse over the right lateral malleolus.  Mild tenderness to palpation over the dorsum of his foot and Achilles tendon. ROM:No significant pain with dorsiflexion or plantarflexion. Neurovascular exam: Equal tendon reflexes bilaterally.  Good peripheral pulses. Specific testing:   Negative squeeze test.  No results found for this or any previous visit (from the past 72 hour(s)).   Assessment/Plan:  Acute right ankle pain Per [place] rules, foot x-rays were obtained due to significant tenderness to palpation of the lateral malleolus.  Foot x-rays showed no evidence of fracture of the lateral malleolus though did show some evidence of bone spurs and arthritis in  the ankle.  At this time, history and physical and imaging are most consistent with a soft tissue injury of the ankle with contributions from chronic osteoarthritis of the ankle.  Low suspicion for gout based on lack of heat, erythema and significant swelling. -Mr. Orris called and informed of x-ray results -ASO ankle brace provided at clinic, he was encouraged to use this brace daily for additional ankle support -Pain control with Tylenol.  NSAIDs avoided due to significant CKD -Follow-up in 2-3 weeks to assess improvement.  If minimal improvement, consider referral to sports medicine

## 2019-07-27 NOTE — Assessment & Plan Note (Addendum)
Per [facility], foot x-rays were obtained due to significant tenderness to palpation of the lateral malleolus.  Foot x-rays showed no evidence of fracture of the lateral malleolus though did show some evidence of bone spurs and arthritis in the ankle.  At this time, history and physical and imaging are most consistent with a soft tissue injury of the ankle with contributions from chronic osteoarthritis of the ankle.  Low suspicion for gout based on lack of heat, erythema and significant swelling. -Mr. Thivierge called and informed of x-ray results -ASO ankle brace provided at clinic, he was encouraged to use this brace daily for additional ankle support -Pain control with Tylenol.  NSAIDs avoided due to significant CKD -Follow-up in 2-3 weeks to assess improvement.  If minimal improvement, consider referral to sports medicine

## 2019-08-08 ENCOUNTER — Emergency Department (HOSPITAL_COMMUNITY): Payer: BC Managed Care – PPO

## 2019-08-08 ENCOUNTER — Encounter (HOSPITAL_COMMUNITY): Payer: Self-pay | Admitting: Emergency Medicine

## 2019-08-08 ENCOUNTER — Inpatient Hospital Stay (HOSPITAL_COMMUNITY)
Admission: EM | Admit: 2019-08-08 | Discharge: 2019-08-13 | DRG: 177 | Discharge status: Against Medical Advice | Payer: BC Managed Care – PPO | Attending: Family Medicine | Admitting: Family Medicine

## 2019-08-08 ENCOUNTER — Other Ambulatory Visit: Payer: Self-pay

## 2019-08-08 DIAGNOSIS — E872 Acidosis: Secondary | ICD-10-CM | POA: Diagnosis present

## 2019-08-08 DIAGNOSIS — G40909 Epilepsy, unspecified, not intractable, without status epilepticus: Secondary | ICD-10-CM | POA: Diagnosis present

## 2019-08-08 DIAGNOSIS — I129 Hypertensive chronic kidney disease with stage 1 through stage 4 chronic kidney disease, or unspecified chronic kidney disease: Secondary | ICD-10-CM | POA: Diagnosis not present

## 2019-08-08 DIAGNOSIS — E1122 Type 2 diabetes mellitus with diabetic chronic kidney disease: Secondary | ICD-10-CM | POA: Diagnosis present

## 2019-08-08 DIAGNOSIS — E860 Dehydration: Secondary | ICD-10-CM | POA: Diagnosis present

## 2019-08-08 DIAGNOSIS — Z5111 Encounter for antineoplastic chemotherapy: Secondary | ICD-10-CM | POA: Diagnosis not present

## 2019-08-08 DIAGNOSIS — N186 End stage renal disease: Secondary | ICD-10-CM | POA: Diagnosis present

## 2019-08-08 DIAGNOSIS — Z833 Family history of diabetes mellitus: Secondary | ICD-10-CM | POA: Diagnosis not present

## 2019-08-08 DIAGNOSIS — Z8249 Family history of ischemic heart disease and other diseases of the circulatory system: Secondary | ICD-10-CM

## 2019-08-08 DIAGNOSIS — N179 Acute kidney failure, unspecified: Secondary | ICD-10-CM

## 2019-08-08 DIAGNOSIS — Z791 Long term (current) use of non-steroidal anti-inflammatories (NSAID): Secondary | ICD-10-CM | POA: Diagnosis not present

## 2019-08-08 DIAGNOSIS — Z20822 Contact with and (suspected) exposure to covid-19: Secondary | ICD-10-CM

## 2019-08-08 DIAGNOSIS — E7800 Pure hypercholesterolemia, unspecified: Secondary | ICD-10-CM | POA: Diagnosis not present

## 2019-08-08 DIAGNOSIS — M542 Cervicalgia: Secondary | ICD-10-CM | POA: Diagnosis present

## 2019-08-08 DIAGNOSIS — J1282 Pneumonia due to coronavirus disease 2019: Secondary | ICD-10-CM | POA: Diagnosis not present

## 2019-08-08 DIAGNOSIS — R0602 Shortness of breath: Secondary | ICD-10-CM | POA: Diagnosis not present

## 2019-08-08 DIAGNOSIS — E1165 Type 2 diabetes mellitus with hyperglycemia: Secondary | ICD-10-CM | POA: Diagnosis present

## 2019-08-08 DIAGNOSIS — T380X5A Adverse effect of glucocorticoids and synthetic analogues, initial encounter: Secondary | ICD-10-CM | POA: Diagnosis not present

## 2019-08-08 DIAGNOSIS — N19 Unspecified kidney failure: Secondary | ICD-10-CM | POA: Diagnosis not present

## 2019-08-08 DIAGNOSIS — Z794 Long term (current) use of insulin: Secondary | ICD-10-CM | POA: Diagnosis not present

## 2019-08-08 DIAGNOSIS — E785 Hyperlipidemia, unspecified: Secondary | ICD-10-CM | POA: Diagnosis not present

## 2019-08-08 DIAGNOSIS — N184 Chronic kidney disease, stage 4 (severe): Secondary | ICD-10-CM | POA: Diagnosis present

## 2019-08-08 DIAGNOSIS — J9600 Acute respiratory failure, unspecified whether with hypoxia or hypercapnia: Secondary | ICD-10-CM | POA: Diagnosis not present

## 2019-08-08 DIAGNOSIS — R0789 Other chest pain: Secondary | ICD-10-CM | POA: Diagnosis not present

## 2019-08-08 DIAGNOSIS — G4733 Obstructive sleep apnea (adult) (pediatric): Secondary | ICD-10-CM | POA: Diagnosis present

## 2019-08-08 DIAGNOSIS — I120 Hypertensive chronic kidney disease with stage 5 chronic kidney disease or end stage renal disease: Secondary | ICD-10-CM | POA: Diagnosis present

## 2019-08-08 DIAGNOSIS — Z4901 Encounter for fitting and adjustment of extracorporeal dialysis catheter: Secondary | ICD-10-CM | POA: Diagnosis not present

## 2019-08-08 DIAGNOSIS — IMO0002 Reserved for concepts with insufficient information to code with codable children: Secondary | ICD-10-CM

## 2019-08-08 DIAGNOSIS — J9601 Acute respiratory failure with hypoxia: Secondary | ICD-10-CM | POA: Diagnosis present

## 2019-08-08 DIAGNOSIS — Z5329 Procedure and treatment not carried out because of patient's decision for other reasons: Secondary | ICD-10-CM | POA: Diagnosis not present

## 2019-08-08 DIAGNOSIS — J9602 Acute respiratory failure with hypercapnia: Secondary | ICD-10-CM | POA: Diagnosis not present

## 2019-08-08 DIAGNOSIS — Z6841 Body Mass Index (BMI) 40.0 and over, adult: Secondary | ICD-10-CM | POA: Diagnosis not present

## 2019-08-08 DIAGNOSIS — E875 Hyperkalemia: Secondary | ICD-10-CM | POA: Diagnosis present

## 2019-08-08 DIAGNOSIS — E1121 Type 2 diabetes mellitus with diabetic nephropathy: Secondary | ICD-10-CM

## 2019-08-08 DIAGNOSIS — U071 COVID-19: Principal | ICD-10-CM | POA: Diagnosis present

## 2019-08-08 DIAGNOSIS — N170 Acute kidney failure with tubular necrosis: Secondary | ICD-10-CM | POA: Diagnosis not present

## 2019-08-08 LAB — BASIC METABOLIC PANEL
Anion gap: 9 (ref 5–15)
BUN: 58 mg/dL — ABNORMAL HIGH (ref 6–20)
CO2: 20 mmol/L — ABNORMAL LOW (ref 22–32)
Calcium: 8.3 mg/dL — ABNORMAL LOW (ref 8.9–10.3)
Chloride: 106 mmol/L (ref 98–111)
Creatinine, Ser: 5.11 mg/dL — ABNORMAL HIGH (ref 0.61–1.24)
GFR calc Af Amer: 14 mL/min — ABNORMAL LOW (ref >60)
GFR calc non Af Amer: 12 mL/min — ABNORMAL LOW (ref >60)
Glucose, Bld: 275 mg/dL — ABNORMAL HIGH (ref 70–99)
Potassium: 5.1 mmol/L (ref 3.5–5.1)
Sodium: 135 mmol/L (ref 135–145)

## 2019-08-08 LAB — CBC WITH DIFFERENTIAL/PLATELET
Abs Immature Granulocytes: 0.05 10*3/uL (ref 0.00–0.07)
Basophils Absolute: 0 10*3/uL (ref 0.0–0.1)
Basophils Relative: 0 %
Eosinophils Absolute: 0 10*3/uL (ref 0.0–0.5)
Eosinophils Relative: 0 %
HCT: 36.1 % — ABNORMAL LOW (ref 39.0–52.0)
Hemoglobin: 11.3 g/dL — ABNORMAL LOW (ref 13.0–17.0)
Immature Granulocytes: 1 %
Lymphocytes Relative: 11 %
Lymphs Abs: 0.7 10*3/uL (ref 0.7–4.0)
MCH: 24.9 pg — ABNORMAL LOW (ref 26.0–34.0)
MCHC: 31.3 g/dL (ref 30.0–36.0)
MCV: 79.5 fL — ABNORMAL LOW (ref 80.0–100.0)
Monocytes Absolute: 1.2 10*3/uL — ABNORMAL HIGH (ref 0.1–1.0)
Monocytes Relative: 19 %
Neutro Abs: 4.2 10*3/uL (ref 1.7–7.7)
Neutrophils Relative %: 69 %
Platelets: 201 10*3/uL (ref 150–400)
RBC: 4.54 MIL/uL (ref 4.22–5.81)
RDW: 15 % (ref 11.5–15.5)
WBC: 6.2 10*3/uL (ref 4.0–10.5)
nRBC: 0 % (ref 0.0–0.2)

## 2019-08-08 LAB — FIBRINOGEN: Fibrinogen: 714 mg/dL — ABNORMAL HIGH (ref 210–475)

## 2019-08-08 LAB — POC SARS CORONAVIRUS 2 AG -  ED: SARS Coronavirus 2 Ag: NEGATIVE

## 2019-08-08 LAB — RESPIRATORY PANEL BY RT PCR (FLU A&B, COVID)
Influenza A by PCR: NEGATIVE
Influenza B by PCR: NEGATIVE
SARS Coronavirus 2 by RT PCR: POSITIVE — AB

## 2019-08-08 LAB — D-DIMER, QUANTITATIVE: D-Dimer, Quant: 3.35 ug/mL-FEU — ABNORMAL HIGH (ref 0.00–0.50)

## 2019-08-08 LAB — LACTIC ACID, PLASMA: Lactic Acid, Venous: 0.8 mmol/L (ref 0.5–1.9)

## 2019-08-08 LAB — LACTATE DEHYDROGENASE: LDH: 282 U/L — ABNORMAL HIGH (ref 98–192)

## 2019-08-08 LAB — PROCALCITONIN: Procalcitonin: 0.35 ng/mL

## 2019-08-08 LAB — GLUCOSE, CAPILLARY: Glucose-Capillary: 307 mg/dL — ABNORMAL HIGH (ref 70–99)

## 2019-08-08 LAB — TRIGLYCERIDES: Triglycerides: 126 mg/dL (ref <150)

## 2019-08-08 LAB — FERRITIN: Ferritin: 453 ng/mL — ABNORMAL HIGH (ref 24–336)

## 2019-08-08 LAB — C-REACTIVE PROTEIN: CRP: 12.4 mg/dL — ABNORMAL HIGH (ref <1.0)

## 2019-08-08 MED ORDER — POLYETHYLENE GLYCOL 3350 17 G PO PACK: 17.0000 g | PACK | Freq: Every day | ORAL | Status: DC | PRN

## 2019-08-08 MED ORDER — HYDRALAZINE HCL 25 MG PO TABS: 25.0000 mg | ORAL_TABLET | Freq: Three times a day (TID) | ORAL | Status: DC

## 2019-08-08 MED ORDER — HEPARIN SODIUM (PORCINE) 5000 UNIT/ML IJ SOLN
5000.0000 [IU] | Freq: Three times a day (TID) | INTRAMUSCULAR | Status: DC
  Administered 2019-08-08 – 2019-08-09 (×2): 5000 [IU] via SUBCUTANEOUS
  Filled 2019-08-08 (×2): qty 1

## 2019-08-08 MED ORDER — ASCORBIC ACID 500 MG PO TABS
500.0000 mg | ORAL_TABLET | Freq: Every day | ORAL | Status: DC
  Administered 2019-08-08 – 2019-08-13 (×6): 500 mg via ORAL
  Filled 2019-08-08 (×7): qty 1

## 2019-08-08 MED ORDER — ACETAMINOPHEN 325 MG PO TABS
650.0000 mg | ORAL_TABLET | Freq: Four times a day (QID) | ORAL | Status: DC | PRN
  Administered 2019-08-09 – 2019-08-12 (×2): 650 mg via ORAL
  Filled 2019-08-08 (×2): qty 2

## 2019-08-08 MED ORDER — HYDRALAZINE HCL 20 MG/ML IJ SOLN
10.0000 mg | Freq: Once | INTRAMUSCULAR | Status: AC
  Administered 2019-08-08: 10 mg via INTRAVENOUS
  Filled 2019-08-08: qty 1

## 2019-08-08 MED ORDER — HYDROCOD POLST-CPM POLST ER 10-8 MG/5ML PO SUER
5.0000 mL | Freq: Two times a day (BID) | ORAL | Status: DC | PRN
  Administered 2019-08-08 – 2019-08-10 (×2): 5 mL via ORAL
  Filled 2019-08-08 (×2): qty 5

## 2019-08-08 MED ORDER — AMLODIPINE BESYLATE 10 MG PO TABS
10.0000 mg | ORAL_TABLET | Freq: Every day | ORAL | Status: DC
  Administered 2019-08-08 – 2019-08-13 (×6): 10 mg via ORAL
  Filled 2019-08-08 (×7): qty 1

## 2019-08-08 MED ORDER — INSULIN ASPART 100 UNIT/ML ~~LOC~~ SOLN
0.0000 [IU] | Freq: Every day | SUBCUTANEOUS | Status: DC
  Administered 2019-08-08: 4 [IU] via SUBCUTANEOUS
  Administered 2019-08-09: 3 [IU] via SUBCUTANEOUS
  Administered 2019-08-10 – 2019-08-12 (×3): 2 [IU] via SUBCUTANEOUS

## 2019-08-08 MED ORDER — CARVEDILOL 6.25 MG PO TABS
6.2500 mg | ORAL_TABLET | Freq: Two times a day (BID) | ORAL | Status: DC
  Administered 2019-08-09 – 2019-08-13 (×10): 6.25 mg via ORAL
  Filled 2019-08-08 (×11): qty 1

## 2019-08-08 MED ORDER — SODIUM CHLORIDE 0.9 % IV SOLN: INTRAVENOUS | Status: DC

## 2019-08-08 MED ORDER — INSULIN ASPART 100 UNIT/ML ~~LOC~~ SOLN
0.0000 [IU] | Freq: Three times a day (TID) | SUBCUTANEOUS | Status: DC
  Administered 2019-08-09: 15 [IU] via SUBCUTANEOUS
  Administered 2019-08-09 (×2): 11 [IU] via SUBCUTANEOUS
  Administered 2019-08-10: 5 [IU] via SUBCUTANEOUS
  Administered 2019-08-10 (×2): 8 [IU] via SUBCUTANEOUS
  Administered 2019-08-11: 3 [IU] via SUBCUTANEOUS
  Administered 2019-08-11 – 2019-08-12 (×5): 5 [IU] via SUBCUTANEOUS
  Administered 2019-08-13: 8 [IU] via SUBCUTANEOUS
  Administered 2019-08-13: 5 [IU] via SUBCUTANEOUS

## 2019-08-08 MED ORDER — HYDRALAZINE HCL 25 MG PO TABS
25.0000 mg | ORAL_TABLET | Freq: Three times a day (TID) | ORAL | Status: DC
  Administered 2019-08-08 – 2019-08-13 (×15): 25 mg via ORAL
  Filled 2019-08-08 (×15): qty 1

## 2019-08-08 MED ORDER — DEXAMETHASONE SODIUM PHOSPHATE 10 MG/ML IJ SOLN
6.0000 mg | INTRAMUSCULAR | Status: DC
  Administered 2019-08-09 – 2019-08-13 (×5): 6 mg via INTRAVENOUS
  Filled 2019-08-08 (×6): qty 1

## 2019-08-08 MED ORDER — ASPIRIN EC 81 MG PO TBEC
81.0000 mg | DELAYED_RELEASE_TABLET | Freq: Every day | ORAL | Status: DC
  Administered 2019-08-08 – 2019-08-13 (×6): 81 mg via ORAL
  Filled 2019-08-08 (×6): qty 1

## 2019-08-08 MED ORDER — DEXAMETHASONE SODIUM PHOSPHATE 10 MG/ML IJ SOLN
6.0000 mg | Freq: Once | INTRAMUSCULAR | Status: AC
  Administered 2019-08-08: 6 mg via INTRAVENOUS
  Filled 2019-08-08: qty 1

## 2019-08-08 MED ORDER — LEVETIRACETAM ER 500 MG PO TB24
1500.0000 mg | ORAL_TABLET | Freq: Every day | ORAL | Status: DC
  Administered 2019-08-08 – 2019-08-10 (×3): 1500 mg via ORAL
  Filled 2019-08-08 (×3): qty 3

## 2019-08-08 MED ORDER — ATORVASTATIN CALCIUM 40 MG PO TABS
40.0000 mg | ORAL_TABLET | Freq: Every day | ORAL | Status: DC
  Administered 2019-08-08 – 2019-08-13 (×6): 40 mg via ORAL
  Filled 2019-08-08 (×7): qty 1

## 2019-08-08 MED ORDER — ALBUTEROL SULFATE HFA 108 (90 BASE) MCG/ACT IN AERS
2.0000 | INHALATION_SPRAY | Freq: Four times a day (QID) | RESPIRATORY_TRACT | Status: DC
  Administered 2019-08-08 – 2019-08-10 (×8): 2 via RESPIRATORY_TRACT
  Filled 2019-08-08 (×5): qty 6.7

## 2019-08-08 MED ORDER — SODIUM CHLORIDE 0.9 % IV BOLUS
500.0000 mL | Freq: Once | INTRAVENOUS | Status: AC
  Administered 2019-08-08: 500 mL via INTRAVENOUS

## 2019-08-08 MED ORDER — ZINC SULFATE 220 (50 ZN) MG PO CAPS
220.0000 mg | ORAL_CAPSULE | Freq: Every day | ORAL | Status: DC
  Administered 2019-08-08 – 2019-08-13 (×6): 220 mg via ORAL
  Filled 2019-08-08 (×6): qty 1

## 2019-08-08 NOTE — ED Notes (Signed)
Sats 89% on room air patient placed on O2 2L/min via nasal cannula. Sats increased to 93%.

## 2019-08-08 NOTE — H&P (Signed)
History and Physical        Hospital Admission Note Date: 08/08/2019  Patient name: Jeremy [name] Medical record number: [id number] Date of birth: 23-Jun-1966 Age: 54 y.o. Gender: male  PCP: Danna Hefty, DO    Patient coming from: Home  I have reviewed all records in the [place].    Chief Complaint:  Increasing shortness of breath for last 4 days  HPI: Patient is a 54 year old male with history of insulin-dependent diabetes mellitus, hypertension, hyperlipidemia, obesity, OSA, seizure disorder, chronic kidney disease stage IV presented to ED with worsening shortness of breath for last 4 days.  Patient reports that his girlfriend has Covid. Patient states that he started having intermittent cough, chest tightness and shortness of breath since 08/04/2019.  He states nausea, myalgias, fevers (states had a fever of 103 F today), chills, poor appetite, diarrhea.  He does not use supplemental O2 at baseline.  Patient reports that he has not been tested for Covid yet.  COVID-19 test pending  ED work-up/course:  Temp 98.8, RR 19, heart rate 87 BP 148/78 O2 sats 95% on 3 L  Sodium 135, potassium 5.1, CO2 20, glucose 275, BUN 58, creatinine 5.1  Review of Systems: Positives marked in 'bold' Constitutional: fever, chills, diaphoresis, poor appetite and fatigue.  HEENT: Denies photophobia, eye pain, redness, hearing loss, ear pain, congestion, sore throat, rhinorrhea, sneezing, mouth sores, trouble swallowing, neck pain, neck stiffness and tinnitus.   Respiratory: Please see HPI Cardiovascular: Denies any palpitations, leg swelling Gastrointestinal: Please see HPI Genitourinary: Denies dysuria, urgency, frequency, hematuria, flank pain and difficulty urinating.  Musculoskeletal: + myalgias, denies back pain, joint swelling, arthralgias and gait problem.  Skin: Denies  pallor, rash and wound.  Neurological: Generalized weakness, has a history of seizure disorder Hematological: Denies adenopathy. Easy bruising, personal or family bleeding history  Psychiatric/Behavioral: Denies suicidal ideation, mood changes, confusion, nervousness, sleep disturbance and agitation  Past Medical History: Past Medical History:  Diagnosis Date  . Abscess   . AKI (acute kidney injury) ([place]) 08/05/2017  . Bell's palsy   . CKD (chronic kidney disease)    Dr. McDiarmind   . High cholesterol   . Hypertension   . Left shoulder pain 10/26/2013   S/p injection on 10/26/13   . Renal insufficiency 02/14/2014  . Seizures ([place]) 08/04/2017   "related to high blood sugars" (08/05/2017)  . Type II diabetes mellitus ([place])     Past Surgical History:  Procedure Laterality Date  . Eyelid surgery Right   . I & D EXTREMITY  11/11/2011   Procedure: IRRIGATION AND DEBRIDEMENT EXTREMITY;  Surgeon: Merrie Roof, MD;  Location: [facility];  Service: General;  Laterality: Right;  I&D Right Thigh Abscess    Medications: Prior to Admission medications   Medication Sig Start Date End Date Taking? Authorizing Provider  amLODipine (NORVASC) 10 MG tablet TAKE 1 TABLET BY MOUTH EVERY DAY Patient taking differently: Take 10 mg by mouth daily.  11/24/18  Yes [place] Bing, DO  aspirin EC 81 MG tablet Take 1 tablet (81 mg total) by mouth daily. 11/06/17  Yes Rogue Bussing, MD  atorvastatin (LIPITOR) 40 MG  tablet Take 1 tablet (40 mg total) by mouth daily. 11/06/17  Yes Rogue Bussing, MD  carvedilol (COREG) 6.25 MG tablet TAKE 1 TABLET (6.25 MG TOTAL) BY MOUTH 2 (TWO) TIMES DAILY WITH A MEAL 10/05/18  Yes [place]ont Bing, DO  enalapril (VASOTEC) 5 MG tablet TAKE 1 TABLET BY MOUTH EVERY DAY Patient taking differently: Take 5 mg by mouth daily.  04/21/19  Yes Mullis, Kiersten P, DO  furosemide (LASIX) 80 MG tablet Take 1 tablet (80 mg total) by mouth 2 (two) times daily. 10/08/18  Yes  McMullen, Grayling Congress, DO  HUMALOG KWIKPEN 100 UNIT/ML KwikPen INJECT 0-9 UNITS 3 TIMES DAILY AND AT BEDTIME WITH MEALS AS DIRECTED ON ATTACHED SHEET Patient taking differently: Inject 25 Units into the skin once a week.  06/03/18  Yes [place] Bing, DO  hydrALAZINE (APRESOLINE) 25 MG tablet Take 1 tablet (25 mg total) by mouth 3 (three) times daily. 12/01/18  Yes [place] Bing, DO  Levetiracetam 750 MG TB24 Take 2 tablets (1,500 mg total) by mouth daily. 02/04/19  Yes Millikan, Jinny Blossom, NP  liraglutide (VICTOZA) 18 MG/3ML SOPN Inject 0.1-0.2 mLs (0.6-1.2 mg total) into the skin daily. 0.6 mg once daily for 1 week,then increase to 1.2 mg once daily Patient taking differently: Inject 1.2 mg into the skin daily. 0.6 mg once daily for 1 week,then increase to 1.2 mg once daily 05/26/19  Yes Hensel, Jamal Collin, MD  polyethylene glycol powder (GLYCOLAX/MIRALAX) powder Take 17 g (or 1 tablespoon) daily mixed in water. Patient taking differently: Take 17 g by mouth daily as needed for mild constipation, moderate constipation or severe constipation. Take 17 g (or 1 tablespoon) daily mixed in water. 11/06/17  Yes Rogue Bussing, MD  zolpidem (AMBIEN) 5 MG tablet Take 1 tablet (5 mg total) by mouth at bedtime as needed for sleep. 12/22/18  Yes [place] Bing, DO  glucose blood (TRUE METRIX BLOOD GLUCOSE TEST) test strip Check up to four times daily. 11/06/17   Rogue Bussing, MD  hydrALAZINE (APRESOLINE) 25 MG tablet TAKE 1 TABLET BY MOUTH THREE TIMES A DAY 04/28/18   [place]ley Bing, DO  insulin lispro (HUMALOG) 100 UNIT/ML injection Inject 0-9 Units into skin three times daily with meals. 06/25/18   [place] Bing, DO  Insulin Pen Needle (B-D ULTRAFINE III SHORT PEN) 31G X 8 MM MISC 1 application by Does not apply route 4 (four) times daily. 09/15/17   [place] Bing, DO  sildenafil (VIAGRA) 100 MG tablet Take 0.5-1 tablets (50-100 mg total) by mouth daily as needed for erectile  dysfunction. 09/23/18    Bing, DO    Allergies:  No Known Allergies  Social History:  reports that he has never smoked. He has never used smokeless tobacco. He reports current alcohol use. He reports that he does not use drugs.  Family History: Family History  Problem Relation Age of Onset  . Diabetes Mother   . Heart disease Mother 27  . Diabetes Sister   . Diabetes Brother   . Diabetes Brother   . Cancer Maternal Uncle        type unknown  . Stroke Neg Hx     Physical Exam: Blood pressure (!) 169/84, pulse 88, temperature 100.3 F (37.9 C), temperature source Oral, resp. rate (!) 24, height 5\' 8"  (1.727 m), weight 131.5 kg, SpO2 98 %. General: Alert, awake, oriented x3, in no acute distress. Eyes: pink conjunctiva,anicteric sclera, pupils equal and reactive  to light and accomodation, HEENT: normocephalic, atraumatic, oropharynx clear Neck: supple, no masses or lymphadenopathy, no goiter, no bruits, no JVD CVS: Regular rate and rhythm, without murmurs, rubs or gallops. No lower extremity edema Resp : Decreased breath sound at the bases GI : Soft, nontender, nondistended, positive bowel sounds, no masses. No hepatomegaly. No hernia.  Musculoskeletal: No clubbing or cyanosis, positive pedal pulses. No contracture. ROM intact  Neuro: Grossly intact, no focal neurological deficits, strength 5/5 upper and lower extremities bilaterally Psych: alert and oriented x 3, normal mood and affect Skin: no rashes or lesions, warm and dry   LABS on Admission: I have personally reviewed all the labs and imagings below    Basic Metabolic Panel: Recent Labs  Lab 08/08/19 1436  NA 135  K 5.1  CL 106  CO2 20*  GLUCOSE 275*  BUN 58*  CREATININE 5.11*  CALCIUM 8.3*   Liver Function Tests: No results for input(s): AST, ALT, ALKPHOS, BILITOT, PROT, ALBUMIN in the last 168 hours. No results for input(s): LIPASE, AMYLASE in the last 168 hours. No results for input(s): AMMONIA  in the last 168 hours. CBC: Recent Labs  Lab 08/08/19 1436  WBC 6.2  NEUTROABS 4.2  HGB 11.3*  HCT 36.1*  MCV 79.5*  PLT 201   Cardiac Enzymes: No results for input(s): CKTOTAL, CKMB, CKMBINDEX, TROPONINI in the last 168 hours. BNP: Invalid input(s): POCBNP CBG: No results for input(s): GLUCAP in the last 168 hours.  Radiological Exams on Admission:  DG Chest 2 View  Result Date: 08/08/2019 CLINICAL DATA:  Per pt, states he has been SOB for 3 days-states his GF is admitted now for covid EXAM: CHEST - 2 VIEW COMPARISON:  09/24/2018 FINDINGS: Interval development of extensive moderate patchy airspace opacities with relative sparing of the apices. Heart size upper limits normal. No pneumothorax. No pleural effusion. Visualized bones unremarkable. IMPRESSION: New bilateral airspace disease. Electronically Signed   By: Lucrezia Europe M.D.   On: 08/08/2019 14:33      EKG: Independently reviewed.  Rate 93, normal sinus rhythm   Assessment/Plan Principal Problem:   Acute respiratory failure (HCC) with hypoxia likely due to COVID-19, as patient's girlfriend  has Covid, presenting with classic symptoms of COVID-19 -Currently hypoxic, on 3 L O2, point-of-care Covid test negative, awaiting confirmatory test -Chest x-ray showed new bilateral airspace disease -Given hypoxia, will start on Decadron IV 6 mg every 24 hours, albuterol, antitussives. -Awaiting confirmatory Covid test, inflammatory markers.  If positive, will check with pharmacy regarding remdesivir as creatinine 5.1 with BUN 58, presenting with acute kidney injury -Follow CRP, D-dimer, procalcitonin, ferritin, fibrinogen, LDH -Continue O2 via nasal cannula   Active Problems: Acute kidney injury on CKD stage IV -Baseline creatinine 3.3-3.7, presenting with creatinine of 5.1 with metabolic acidosis -Follow lactic acid -Patient also reports poor appetite, diarrhea in the last 3 days, likely acute component prerenal from  dehydration and medications -Hold enalapril, furosemide - will give 1 L IV fluid, then reassess renal function, if no improvement will discuss with nephrology    Morbid obesity ([address]) -BMI 44.1 -Counseled on diet and weight control    Uncontrolled type 2 diabetes mellitus with diabetic nephropathy, with long-term current use of insulin (HCC) presented with hyperglycemia -Obtain hemoglobin A1c, placed on sliding scale insulin moderate -CBGs expected to rise with Decadron, follow closely  History of seizures -Continue Keppra  Essential hypertension -BP currently elevated, restart hydralazine, Coreg, amlodipine -Hold furosemide, enalapril secondary to acute kidney injury on  CKD   DVT prophylaxis: Heparin subcu  CODE STATUS: Full CODE STATUS, discussed with the patient  Consults called: None   Admission status:   The medical decision making on this patient was of high complexity and the patient is at high risk for clinical deterioration, therefore this is a level 3 admission.  Severity of Illness:      The appropriate patient status for this patient is INPATIENT. Inpatient status is judged to be reasonable and necessary in order to provide the required intensity of service to ensure the patient's safety. The patient's presenting symptoms, physical exam findings, and initial radiographic and laboratory data in the context of their chronic comorbidities is felt to place them at high risk for further clinical deterioration. Furthermore, it is not anticipated that the patient will be medically stable for discharge from the hospital within 2 midnights of admission. The following factors support the patient status of inpatient.   " The patient's presenting symptoms include shortness of breath with nausea, diarrhea, fevers and chills.  Girlfriend has Covid. " The worrisome physical exam findings include hypoxia with respiratory failure " The initial radiographic and laboratory data are  worrisome because of chest x-ray with pneumonia " The chronic co-morbidities include diabetes, hypertension, hyperlipidemia, obesity   * I certify that at the point of admission it is my clinical judgment that the patient will require inpatient hospital care spanning beyond 2 midnights from the point of admission due to high intensity of service, high risk for further deterioration and high frequency of surveillance required.*    Time Spent on Admission: 68mins      Jayvier Burgher M.D. [facility] 08/08/2019, 4:41 PM

## 2019-08-08 NOTE — ED Notes (Signed)
Pt was placed on 3 L/M via Nasal Cannula per RN.

## 2019-08-08 NOTE — ED Triage Notes (Signed)
Per pt, states he has been SOB for 3 days-states his GF is admitted now for covid

## 2019-08-08 NOTE — ED Provider Notes (Addendum)
[facility] DEPT Provider Note   CSN: [id number] Arrival date & time: 08/08/19  1315     History Chief Complaint  Patient presents with  . exposure to covid  . Shortness of Breath    Jeremy [name] is a 54 y.o. male with a past medical history of CKD, hypertension, diabetes, presenting to the ED with a chief complaint of shortness of breath.  Since 08/04/2019 started having shortness of breath, intermittent cough, chest tightness.  Has a known COVID-51 exposure with his girlfriend who is currently admitted for Covid.  He has not taken any over-the-counter medications to help with his symptoms.  He denies any chronic lung disease and does not wear supplemental oxygen at baseline.  He has not been tested for Covid yet.  Denies any chest pain, leg swelling, vomiting, abdominal pain, diarrhea or fever.  HPI     Past Medical History:  Diagnosis Date  . Abscess   . AKI (acute kidney injury) ([place]) 08/05/2017  . Bell's palsy   . CKD (chronic kidney disease)    Dr. McDiarmind   . High cholesterol   . Hypertension   . Left shoulder pain 10/26/2013   S/p injection on 10/26/13   . Renal insufficiency 02/14/2014  . Seizures ([place]) 08/04/2017   "related to high blood sugars" (08/05/2017)  . Type II diabetes mellitus [facility])     Patient Active Problem List   Diagnosis Date Noted  . Acute respiratory failure ([place]) 08/08/2019  . Acute right ankle pain 07/27/2019  . Tinnitus 05/26/2019  . OSA (obstructive sleep apnea) 10/07/2018  . Resistant hypertension 09/23/2018  . Secondary hyperparathyroidism ([place]) 04/06/2018  . Pituitary incidentaloma ([place]) 08/10/2017  . Seizure ([place])   . Bilateral lower extremity edema 04/08/2017  . Uncontrolled type 2 diabetes mellitus with diabetic nephropathy, with long-term current use of insulin ([place]) 03/03/2017  . Chronic kidney disease (CKD), stage IV (severe) ([place]) 02/14/2014  . Morbid obesity ([place]) 05/20/2013  . Erectile  dysfunction associated with type 2 diabetes mellitus ([place]) 06/02/2012  . Macular edema 12/12/2011  . Mixed hyperlipidemia due to type 2 diabetes mellitus ([place]) 11/11/2011    Past Surgical History:  Procedure Laterality Date  . Eyelid surgery Right   . I & D EXTREMITY  11/11/2011   Procedure: IRRIGATION AND DEBRIDEMENT EXTREMITY;  Surgeon: Merrie Roof, MD;  Location: [facility] OR;  Service: General;  Laterality: Right;  I&D Right Thigh Abscess       Family History  Problem Relation Age of Onset  . Diabetes Mother   . Heart disease Mother 92  . Diabetes Sister   . Diabetes Brother   . Diabetes Brother   . Cancer Maternal Uncle        type unknown  . Stroke Neg Hx     Social History   Tobacco Use  . Smoking status: Never Smoker  . Smokeless tobacco: Never Used  Substance Use Topics  . Alcohol use: Yes    Comment: rarely-once or twice a year  . Drug use: No    Home Medications Prior to Admission medications   Medication Sig Start Date End Date Taking? Authorizing Provider  amLODipine (NORVASC) 10 MG tablet TAKE 1 TABLET BY MOUTH EVERY DAY Patient taking differently: Take 10 mg by mouth daily.  11/24/18  Yes [place] Bing, DO  aspirin EC 81 MG tablet Take 1 tablet (81 mg total) by mouth daily. 11/06/17  Yes Rogue Bussing, MD  atorvastatin (LIPITOR) 40  MG tablet Take 1 tablet (40 mg total) by mouth daily. 11/06/17  Yes Rogue Bussing, MD  carvedilol (COREG) 6.25 MG tablet TAKE 1 TABLET (6.25 MG TOTAL) BY MOUTH 2 (TWO) TIMES DAILY WITH A MEAL 10/05/18  Yes [place] Bing, DO  enalapril (VASOTEC) 5 MG tablet TAKE 1 TABLET BY MOUTH EVERY DAY Patient taking differently: Take 5 mg by mouth daily.  04/21/19  Yes Mullis, Kiersten P, DO  furosemide (LASIX) 80 MG tablet Take 1 tablet (80 mg total) by mouth 2 (two) times daily. 10/08/18  Yes McMullen, Grayling Congress, DO  HUMALOG KWIKPEN 100 UNIT/ML KwikPen INJECT 0-9 UNITS 3 TIMES DAILY AND AT BEDTIME WITH MEALS AS DIRECTED  ON ATTACHED SHEET Patient taking differently: Inject 25 Units into the skin once a week.  06/03/18  Yes [place] Bing, DO  hydrALAZINE (APRESOLINE) 25 MG tablet Take 1 tablet (25 mg total) by mouth 3 (three) times daily. 12/01/18  Yes [place] Bing, DO  Levetiracetam 750 MG TB24 Take 2 tablets (1,500 mg total) by mouth daily. 02/04/19  Yes [place], Jinny Blossom, NP  liraglutide (VICTOZA) 18 MG/3ML SOPN Inject 0.1-0.2 mLs (0.6-1.2 mg total) into the skin daily. 0.6 mg once daily for 1 week,then increase to 1.2 mg once daily Patient taking differently: Inject 1.2 mg into the skin daily. 0.6 mg once daily for 1 week,then increase to 1.2 mg once daily 05/26/19  Yes Hensel, Jamal Collin, MD  polyethylene glycol powder (GLYCOLAX/MIRALAX) powder Take 17 g (or 1 tablespoon) daily mixed in water. Patient taking differently: Take 17 g by mouth daily as needed for mild constipation, moderate constipation or severe constipation. Take 17 g (or 1 tablespoon) daily mixed in water. 11/06/17  Yes Rogue Bussing, MD  zolpidem (AMBIEN) 5 MG tablet Take 1 tablet (5 mg total) by mouth at bedtime as needed for sleep. 12/22/18  Yes [place] Bing, DO  glucose blood (TRUE METRIX BLOOD GLUCOSE TEST) test strip Check up to four times daily. 11/06/17   [place] Bussing, MD  hydrALAZINE (APRESOLINE) 25 MG tablet TAKE 1 TABLET BY MOUTH THREE TIMES A DAY 04/28/18   [place] Bing, DO  insulin lispro (HUMALOG) 100 UNIT/ML injection Inject 0-9 Units into skin three times daily with meals. 06/25/18   [place] Bing, DO  Insulin Pen Needle (B-D ULTRAFINE III SHORT PEN) 31G X 8 MM MISC 1 application by Does not apply route 4 (four) times daily. 09/15/17   [place] Bing, DO  sildenafil (VIAGRA) 100 MG tablet Take 0.5-1 tablets (50-100 mg total) by mouth daily as needed for erectile dysfunction. 09/23/18    Bing, DO    Allergies    Patient has no known allergies.  Review of Systems   Review of Systems    Constitutional: Negative for appetite change, chills and fever.  HENT: Negative for ear pain, rhinorrhea, sneezing and sore throat.   Eyes: Negative for photophobia and visual disturbance.  Respiratory: Positive for cough, chest tightness and shortness of breath. Negative for wheezing.   Cardiovascular: Negative for chest pain and palpitations.  Gastrointestinal: Negative for abdominal pain, blood in stool, constipation, diarrhea, nausea and vomiting.  Genitourinary: Negative for dysuria, hematuria and urgency.  Musculoskeletal: Negative for myalgias.  Skin: Negative for rash.  Neurological: Negative for dizziness, weakness and light-headedness.    Physical Exam Updated Vital Signs BP (!) 169/84 (BP Location: Right Arm)   Pulse 88   Temp 100.3 F (37.9 C) (Oral)   Resp Marland Kitchen)  24   Ht 5\' 8"  (1.727 m)   Wt 131.5 kg   SpO2 98%   BMI 44.09 kg/m   Physical Exam Vitals and nursing note reviewed.  Constitutional:      General: He is not in acute distress.    Appearance: He is well-developed.  HENT:     Head: Normocephalic and atraumatic.     Nose: Nose normal.  Eyes:     General: No scleral icterus.       Left eye: No discharge.     Conjunctiva/sclera: Conjunctivae normal.  Cardiovascular:     Rate and Rhythm: Normal rate and regular rhythm.     Heart sounds: Normal heart sounds. No murmur. No friction rub. No gallop.   Pulmonary:     Effort: Pulmonary effort is normal. No respiratory distress.     Breath sounds: Normal breath sounds.  Abdominal:     General: Bowel sounds are normal. There is no distension.     Palpations: Abdomen is soft.     Tenderness: There is no abdominal tenderness. There is no guarding.  Musculoskeletal:        General: Normal range of motion.     Cervical back: Normal range of motion and neck supple.  Skin:    General: Skin is warm and dry.     Findings: No rash.  Neurological:     Mental Status: He is alert.     Motor: No abnormal muscle tone.      Coordination: Coordination normal.     ED Results / Procedures / Treatments   Labs (all labs ordered are listed, but only abnormal results are displayed) Labs Reviewed  BASIC METABOLIC PANEL - Abnormal; Notable for the following components:      Result Value   CO2 20 (*)    Glucose, Bld 275 (*)    BUN 58 (*)    Creatinine, Ser 5.11 (*)    Calcium 8.3 (*)    GFR calc non Af Amer 12 (*)    GFR calc Af Amer 14 (*)    All other components within normal limits  CBC WITH DIFFERENTIAL/PLATELET - Abnormal; Notable for the following components:   Hemoglobin 11.3 (*)    HCT 36.1 (*)    MCV 79.5 (*)    MCH 24.9 (*)    Monocytes Absolute 1.2 (*)    All other components within normal limits  RESPIRATORY PANEL BY RT PCR (FLU A&B, COVID)  LACTIC ACID, PLASMA  LACTIC ACID, PLASMA  D-DIMER, QUANTITATIVE (NOT AT [facility])  PROCALCITONIN  LACTATE DEHYDROGENASE  FERRITIN  TRIGLYCERIDES  FIBRINOGEN  C-REACTIVE PROTEIN  POC SARS CORONAVIRUS 2 AG -  ED    EKG EKG Interpretation  Date/Time:  Sunday August 08 2019 13:31:39 EST Ventricular Rate:  93 PR Interval:    QRS Duration: 94 QT Interval:  354 QTC Calculation: 441 R Axis:   36 Text Interpretation: Sinus rhythm Ventricular premature complex Probable left atrial enlargement Borderline T abnormalities, inferior leads No acute changes TWI are not new (inferior and lateral leads) Confirmed by Varney Biles [phone]) on 08/08/2019 1:56:37 PM   Radiology DG Chest 2 View  Result Date: 08/08/2019 CLINICAL DATA:  Per pt, states he has been SOB for 3 days-states his GF is admitted now for covid EXAM: CHEST - 2 VIEW COMPARISON:  09/24/2018 FINDINGS: Interval development of extensive moderate patchy airspace opacities with relative sparing of the apices. Heart size upper limits normal. No pneumothorax. No pleural effusion. Visualized bones  unremarkable. IMPRESSION: New bilateral airspace disease. Electronically Signed   By: Lucrezia Europe M.D.    On: 08/08/2019 14:33    Procedures .Critical Care Performed by: Delia Heady, PA-C Authorized by: Delia Heady, PA-C   Critical care provider statement:    Critical care time (minutes):  35   Critical care time was exclusive of:  Separately billable procedures and treating other patients   Critical care was necessary to treat or prevent imminent or life-threatening deterioration of the following conditions:  Cardiac failure, circulatory failure, renal failure, sepsis and CNS failure or compromise   Critical care was time spent personally by me on the following activities:  Development of treatment plan with patient or surrogate, discussions with consultants, evaluation of patient's response to treatment, examination of patient, ordering and performing treatments and interventions, ordering and review of laboratory studies, ordering and review of radiographic studies, pulse oximetry, re-evaluation of patient's condition, review of old charts and obtaining history from patient or surrogate   I assumed direction of critical care for this patient from another provider in my specialty: no     (including critical care time)  Medications Ordered in ED Medications  sodium chloride 0.9 % bolus 500 mL (500 mLs Intravenous New Bag/Given 08/08/19 1535)    ED Course  I have reviewed the triage vital signs and the nursing notes.  Pertinent labs & imaging results that were available during my care of the patient were reviewed by me and considered in my medical decision making (see chart for details).    MDM Rules/Calculators/A&P                      Allon Costlow was evaluated in Emergency Department on 08/08/19 for the symptoms described in the history of present illness. He/she was evaluated in the context of the global COVID-19 pandemic, which necessitated consideration that the patient might be at risk for infection with the SARS-CoV-2 virus that causes COVID-19. Institutional protocols and  algorithms that pertain to the evaluation of patients at risk for COVID-19 are in a state of rapid change based on information released by regulatory bodies including the CDC and federal and state organizations. These policies and algorithms were followed during the patient's care in the ED.  54 year old male with past medical history of CKD, hypertension, diabetes presenting to the ED with a chief complaint of shortness of breath.  4 days ago started having shortness of breath, cough and chest tightness.  Known COVID-19 exposure with his girlfriend who has been around numerous times.  Does not wear supplemental oxygen at baseline.  Patient with oxygen saturations 88% on room air and was put on 3 L of oxygen by nasal cannula.  Coarse breath sounds bilaterally.  However speaking complete sentences without signs of respiratory distress.  EKG shows sinus rhythm, no changes from prior tracings.  EKG with findings consistent with bilateral airspace disease concerning for Covid.  Point-of-care Covid test is negative. Will obtain repeat test.  Of note, his creatinine levels increased to 5 which is slightly higher than his baseline but potassium level is normal.  This correlates to his known history of CKD. Will admit to hospitalist service for suspected COVID-19 infection.  Final Clinical Impression(s) / ED Diagnoses Final diagnoses:  Suspected 2019 novel coronavirus infection  Acute respiratory failure with hypoxia ([place])    Rx / DC Orders ED Discharge Orders    None      Portions of this note were generated with  Lobbyist. Dictation errors may occur despite best attempts at proofreading.    Delia Heady, PA-C 08/08/19 Dudley, Ankit, MD 08/08/19 2210

## 2019-08-09 ENCOUNTER — Inpatient Hospital Stay (HOSPITAL_COMMUNITY): Payer: BC Managed Care – PPO

## 2019-08-09 DIAGNOSIS — U071 COVID-19: Principal | ICD-10-CM

## 2019-08-09 LAB — HEMOGLOBIN A1C
Hgb A1c MFr Bld: 9 % — ABNORMAL HIGH (ref 4.8–5.6)
Mean Plasma Glucose: 211.6 mg/dL

## 2019-08-09 LAB — COMPREHENSIVE METABOLIC PANEL
ALT: 19 U/L (ref 0–44)
AST: 20 U/L (ref 15–41)
Albumin: 3.1 g/dL — ABNORMAL LOW (ref 3.5–5.0)
Alkaline Phosphatase: 68 U/L (ref 38–126)
Anion gap: 11 (ref 5–15)
BUN: 71 mg/dL — ABNORMAL HIGH (ref 6–20)
CO2: 17 mmol/L — ABNORMAL LOW (ref 22–32)
Calcium: 8 mg/dL — ABNORMAL LOW (ref 8.9–10.3)
Chloride: 104 mmol/L (ref 98–111)
Creatinine, Ser: 5.74 mg/dL — ABNORMAL HIGH (ref 0.61–1.24)
GFR calc Af Amer: 12 mL/min — ABNORMAL LOW (ref >60)
GFR calc non Af Amer: 10 mL/min — ABNORMAL LOW (ref >60)
Glucose, Bld: 406 mg/dL — ABNORMAL HIGH (ref 70–99)
Potassium: 6.5 mmol/L (ref 3.5–5.1)
Sodium: 132 mmol/L — ABNORMAL LOW (ref 135–145)
Total Bilirubin: 0.3 mg/dL (ref 0.3–1.2)
Total Protein: 7.5 g/dL (ref 6.5–8.1)

## 2019-08-09 LAB — CBC WITH DIFFERENTIAL/PLATELET
Abs Immature Granulocytes: 0.02 10*3/uL (ref 0.00–0.07)
Basophils Absolute: 0 10*3/uL (ref 0.0–0.1)
Basophils Relative: 0 %
Eosinophils Absolute: 0 10*3/uL (ref 0.0–0.5)
Eosinophils Relative: 0 %
HCT: 34.2 % — ABNORMAL LOW (ref 39.0–52.0)
Hemoglobin: 10.3 g/dL — ABNORMAL LOW (ref 13.0–17.0)
Immature Granulocytes: 1 %
Lymphocytes Relative: 11 %
Lymphs Abs: 0.4 10*3/uL — ABNORMAL LOW (ref 0.7–4.0)
MCH: 24.2 pg — ABNORMAL LOW (ref 26.0–34.0)
MCHC: 30.1 g/dL (ref 30.0–36.0)
MCV: 80.5 fL (ref 80.0–100.0)
Monocytes Absolute: 0.3 10*3/uL (ref 0.1–1.0)
Monocytes Relative: 7 %
Neutro Abs: 2.9 10*3/uL (ref 1.7–7.7)
Neutrophils Relative %: 81 %
Platelets: 187 10*3/uL (ref 150–400)
RBC: 4.25 MIL/uL (ref 4.22–5.81)
RDW: 15 % (ref 11.5–15.5)
WBC: 3.5 10*3/uL — ABNORMAL LOW (ref 4.0–10.5)
nRBC: 0 % (ref 0.0–0.2)

## 2019-08-09 LAB — BASIC METABOLIC PANEL
Anion gap: 12 (ref 5–15)
BUN: 82 mg/dL — ABNORMAL HIGH (ref 6–20)
CO2: 17 mmol/L — ABNORMAL LOW (ref 22–32)
Calcium: 8.5 mg/dL — ABNORMAL LOW (ref 8.9–10.3)
Chloride: 105 mmol/L (ref 98–111)
Creatinine, Ser: 6.07 mg/dL — ABNORMAL HIGH (ref 0.61–1.24)
GFR calc Af Amer: 11 mL/min — ABNORMAL LOW (ref >60)
GFR calc non Af Amer: 10 mL/min — ABNORMAL LOW (ref >60)
Glucose, Bld: 344 mg/dL — ABNORMAL HIGH (ref 70–99)
Potassium: 5.5 mmol/L — ABNORMAL HIGH (ref 3.5–5.1)
Sodium: 134 mmol/L — ABNORMAL LOW (ref 135–145)

## 2019-08-09 LAB — GLUCOSE, CAPILLARY
Glucose-Capillary: 366 mg/dL — ABNORMAL HIGH (ref 70–99)
Glucose-Capillary: 323 mg/dL — ABNORMAL HIGH (ref 70–99)
Glucose-Capillary: 346 mg/dL — ABNORMAL HIGH (ref 70–99)
Glucose-Capillary: 261 mg/dL — ABNORMAL HIGH (ref 70–99)

## 2019-08-09 LAB — ABO/RH: ABO/RH(D): O POS

## 2019-08-09 LAB — C-REACTIVE PROTEIN: CRP: 11 mg/dL — ABNORMAL HIGH (ref <1.0)

## 2019-08-09 LAB — FERRITIN: Ferritin: 446 ng/mL — ABNORMAL HIGH (ref 24–336)

## 2019-08-09 LAB — D-DIMER, QUANTITATIVE: D-Dimer, Quant: 2.64 ug/mL-FEU — ABNORMAL HIGH (ref 0.00–0.50)

## 2019-08-09 MED ORDER — INSULIN DETEMIR 100 UNIT/ML ~~LOC~~ SOLN
10.0000 [IU] | Freq: Every day | SUBCUTANEOUS | Status: DC
  Administered 2019-08-09: 10 [IU] via SUBCUTANEOUS
  Filled 2019-08-09: qty 0.1

## 2019-08-09 MED ORDER — SODIUM ZIRCONIUM CYCLOSILICATE 10 G PO PACK
10.0000 g | PACK | Freq: Once | ORAL | Status: AC
  Administered 2019-08-09: 10 g via ORAL
  Filled 2019-08-09: qty 1

## 2019-08-09 MED ORDER — INSULIN ASPART 100 UNIT/ML ~~LOC~~ SOLN
3.0000 [IU] | Freq: Three times a day (TID) | SUBCUTANEOUS | Status: DC
  Administered 2019-08-09 (×2): 3 [IU] via SUBCUTANEOUS

## 2019-08-09 MED ORDER — HEPARIN SODIUM (PORCINE) 5000 UNIT/ML IJ SOLN
7500.0000 [IU] | Freq: Three times a day (TID) | INTRAMUSCULAR | Status: DC
  Administered 2019-08-09 – 2019-08-11 (×6): 7500 [IU] via SUBCUTANEOUS
  Filled 2019-08-09 (×6): qty 2

## 2019-08-09 MED ORDER — CALCIUM GLUCONATE-NACL 1-0.675 GM/50ML-% IV SOLN
1.0000 g | Freq: Once | INTRAVENOUS | Status: AC
  Administered 2019-08-09: 1000 mg via INTRAVENOUS
  Filled 2019-08-09: qty 50

## 2019-08-09 MED ORDER — SODIUM CHLORIDE 0.9 % IV SOLN: INTRAVENOUS | Status: DC

## 2019-08-09 MED ORDER — INSULIN ASPART 100 UNIT/ML ~~LOC~~ SOLN
5.0000 [IU] | Freq: Three times a day (TID) | SUBCUTANEOUS | Status: DC
  Administered 2019-08-10: 5 [IU] via SUBCUTANEOUS

## 2019-08-09 MED ORDER — SODIUM CHLORIDE 0.9 % IV SOLN
100.0000 mg | Freq: Every day | INTRAVENOUS | Status: AC
  Administered 2019-08-10 – 2019-08-13 (×4): 100 mg via INTRAVENOUS
  Filled 2019-08-09 (×2): qty 20
  Filled 2019-08-09: qty 100
  Filled 2019-08-09: qty 20

## 2019-08-09 MED ORDER — INSULIN DETEMIR 100 UNIT/ML ~~LOC~~ SOLN
10.0000 [IU] | Freq: Two times a day (BID) | SUBCUTANEOUS | Status: DC
  Administered 2019-08-09: 10 [IU] via SUBCUTANEOUS
  Filled 2019-08-09 (×2): qty 0.1

## 2019-08-09 MED ORDER — INSULIN DETEMIR 100 UNIT/ML ~~LOC~~ SOLN: 12.0000 [IU] | Freq: Every day | SUBCUTANEOUS | Status: DC

## 2019-08-09 MED ORDER — SODIUM CHLORIDE 0.9 % IV SOLN
200.0000 mg | Freq: Once | INTRAVENOUS | Status: AC
  Administered 2019-08-09: 200 mg via INTRAVENOUS
  Filled 2019-08-09: qty 200

## 2019-08-09 MED ORDER — INSULIN ASPART 100 UNIT/ML IV SOLN
10.0000 [IU] | Freq: Once | INTRAVENOUS | Status: AC
  Administered 2019-08-09: 10 [IU] via INTRAVENOUS

## 2019-08-09 MED ORDER — DEXTROSE 50 % IV SOLN
12.5000 g | Freq: Once | INTRAVENOUS | Status: AC
  Administered 2019-08-09: 12.5 g via INTRAVENOUS
  Filled 2019-08-09: qty 50

## 2019-08-09 NOTE — Plan of Care (Signed)
  Problem: Education: Goal: Knowledge of risk factors and measures for prevention of condition will improve 08/09/2019 1742 by Zadie Rhine, RN Outcome: Progressing 08/09/2019 1543 by Zadie Rhine, RN Outcome: Progressing   Problem: Coping: Goal: Psychosocial and spiritual needs will be supported Outcome: Progressing   Problem: Respiratory: Goal: Will maintain a patent airway 08/09/2019 1742 by Zadie Rhine, RN Outcome: Progressing 08/09/2019 1543 by Zadie Rhine, RN Outcome: Progressing Goal: Complications related to the disease process, condition or treatment will be avoided or minimized 08/09/2019 1742 by Zadie Rhine, RN Outcome: Progressing 08/09/2019 1543 by Zadie Rhine, RN Outcome: Progressing

## 2019-08-09 NOTE — Progress Notes (Signed)
[facility]                                                                              Patient Demographics  Jeremy [name], is a 54 y.o. male, DOB - 16-Jan-1966, KXF:[id number]  Admit date - 08/08/2019   Admitting Physician Andrya Roppolo Krystal Eaton, MD  Outpatient Primary MD for the patient is Danna Hefty, DO  Outpatient specialists:   LOS - 1  days   Medical records reviewed and are as summarized below:    Chief Complaint  Patient presents with  . exposure to covid  . Shortness of Breath       Brief summary   Patient is a 54 year old male with history of insulin-dependent diabetes mellitus, hypertension, hyperlipidemia, obesity, OSA, seizure disorder, chronic kidney disease stage IV presented to ED with worsening shortness of breath for last 4 days.  Patient reports that his girlfriend has Covid. Patient states that he started having intermittent cough, chest tightness and shortness of breath since 08/04/2019.  He states nausea, myalgias, fevers (states had a fever of 103 F today), chills, poor appetite, diarrhea.  He does not use supplemental O2 at baseline COVID-19 test positive  Assessment & Plan    Principal Problem: Acute hypoxic respiratory failure due to acute COVID-19 viral pneumonia during the ongoing COVID-19 pandemic- POA - Patient presented with worsening shortness of breath, intermittent cough, chest tightness, myalgias, fevers, hypoxia.  Chest x-ray showed bilateral airspace disease - Currently hypoxic, requiring 2L nasal cannula - Continue current management: Decadron 6 mg IV daily, Remdesivir per pharmacy protocol, day #2 today - Continue Supportive care: vitamin C/zinc, albuterol, Tylenol. - Continue to wean oxygen, ambulatory O2 screening daily as tolerated  - Oxygen - SpO2: 95 % O2 Flow Rate (L/min): 2 L/min - Continue to follow labs as below  Lab Results  Component Value Date   SARSCOV2NAA POSITIVE (A) 08/08/2019     Recent Labs    Lab 08/08/19 1502 08/09/19 0400  DDIMER 3.35* 2.64*  FERRITIN 453* 446*  CRP 12.4* 11.0*  ALT  --  19  PROCALCITON 0.35  --     Active Problems: Acute kidney injury on CKD stage IV, with metabolic acidosis, hyperkalemia -Baseline creatinine 3.3-3.7, presenting with creatinine of 5.1 with metabolic acidosis -Reported poor appetite, diarrhea in the last 3 days prior to admission, likely acute component prerenal from dehydration and medications -Creatinine still trending up, 5.74, continue to hold enalapril, furosemide - Received IV fluid hydration with no significant improvement, consulted nephrology, follow renal ultrasound     Uncontrolled type 2 diabetes mellitus with diabetic nephropathy, with long-term current use of insulin (HCC) presented with hyperglycemia -CBGs elevated likely due to Decadron -Increase Levemir to 10 units twice daily, added NovoLog meal coverage 5 units 3 times daily AC, continue sliding scale insulin -Hemoglobin A1c 9.0  History of seizures -Continue Keppra  Essential hypertension -BP currently elevated, restart hydralazine, Coreg, amlodipine -Hold furosemide, enalapril secondary to acute kidney injury on CKD  Morbid obesity Estimated body mass index is 44.09 kg/m as calculated from the following:   Height as of this encounter: 5\' 8"  (1.727 m).  Weight as of this encounter: 131.5 kg.  Counseled on diet and weight control  Code Status: Full CODE STATUS DVT Prophylaxis: Heparin subcu Family Communication: Discussed all imaging results, lab results, explained to the patient   Disposition Plan:    Pending clinical status, currently inpatient given acute hypoxic respiratory failure, acute COVID-19 illness.  Once ambulatory without overt symptoms or hypoxia, creatinine improving would consider discharge home.  Time Spent in minutes 35 minutes  Procedures:  None  Consultants:   Nephrology, Dr. Augustin Coupe  Antimicrobials:   Anti-infectives (From  admission, onward)   Start     Dose/Rate Route Frequency Ordered Stop   08/10/19 1000  remdesivir 100 mg in sodium chloride 0.9 % 100 mL IVPB     100 mg 200 mL/hr over 30 Minutes Intravenous Daily 08/09/19 0554 08/14/19 0959   08/09/19 0600  remdesivir 200 mg in sodium chloride 0.9% 250 mL IVPB     200 mg 580 mL/hr over 30 Minutes Intravenous Once 08/09/19 0554 08/09/19 0941          Medications  Scheduled Meds: . albuterol  2 puff Inhalation Q6H  . amLODipine  10 mg Oral Daily  . vitamin C  500 mg Oral Daily  . aspirin EC  81 mg Oral Daily  . atorvastatin  40 mg Oral Daily  . carvedilol  6.25 mg Oral BID WC  . dexamethasone (DECADRON) injection  6 mg Intravenous Q24H  . heparin  7,500 Units Subcutaneous Q8H  . hydrALAZINE  25 mg Oral TID  . insulin aspart  0-15 Units Subcutaneous TID WC  . insulin aspart  0-5 Units Subcutaneous QHS  . insulin aspart  3 Units Subcutaneous TID WC  . insulin detemir  10 Units Subcutaneous Daily  . levETIRAcetam  1,500 mg Oral Daily  . zinc sulfate  220 mg Oral Daily   Continuous Infusions: . sodium chloride 100 mL/hr at 08/09/19 0919  . sodium chloride    . [START ON 08/10/2019] remdesivir 100 mg in NS 100 mL     PRN Meds:.acetaminophen, chlorpheniramine-HYDROcodone, polyethylene glycol      Subjective:   Barnabas Henriques was seen and examined today.  Still spiking fevers overnight, 100.6 F.  Shortness of breath slightly better this morning.  On 2 L O2.   Patient denies dizziness, chest pain, abdominal pain, N/V/D/C, new weakness, numbess, tingling. No acute events overnight.    Objective:   Vitals:   08/08/19 2000 08/08/19 2051 08/09/19 0545 08/09/19 1148  BP: (!) 152/83 (!) 164/88 (!) 147/81 (!) 143/79  Pulse:  87 83 72  Resp: (!) 22 20 20 20   Temp:  (!) 100.6 F (38.1 C) 98.4 F (36.9 C) 98.3 F (36.8 C)  TempSrc:  Oral Oral Oral  SpO2:  97% 95% 95%  Weight:      Height:        Intake/Output Summary (Last 24 hours) at  08/09/2019 1701 Last data filed at 08/09/2019 7902 Gross per 24 hour  Intake 1480 ml  Output 2 ml  Net 1478 ml     Wt Readings from Last 3 Encounters:  08/08/19 131.5 kg  07/27/19 (!) 143.1 kg  05/25/19 (!) 137.3 kg     Exam  General: Alert and oriented x 3, NAD  Eyes:  HEENT:  Atraumatic, normocephalic  Cardiovascular: S1 S2 auscultated, RRR  Respiratory: Bibasilar rhonchi  Gastrointestinal: Soft, nontender, nondistended, + bowel sounds  Ext: no pedal edema bilaterally  Neuro: No new deficits  Musculoskeletal: No  digital cyanosis, clubbing  Skin: No rashes  Psych: Normal affect and demeanor, alert and oriented x3    Data Reviewed:  I have personally reviewed following labs and imaging studies  Micro Results Recent Results (from the past 240 hour(s))  Respiratory Panel by RT PCR (Flu A&B, Covid) - Nasopharyngeal Swab     Status: Abnormal   Collection Time: 08/08/19  3:36 PM   Specimen: Nasopharyngeal Swab  Result Value Ref Range Status   SARS Coronavirus 2 by RT PCR POSITIVE (A) NEGATIVE Final    Comment: RESULT CALLED TO, READ BACK BY AND VERIFIED WITH: Marcy Panning RN 1911 08/08/19 JM (NOTE) SARS-CoV-2 target nucleic acids are DETECTED. SARS-CoV-2 RNA is generally detectable in upper respiratory specimens  during the acute phase of infection. Positive results are indicative of the presence of the identified virus, but do not rule out bacterial infection or co-infection with other pathogens not detected by the test. Clinical correlation with patient history and other diagnostic information is necessary to determine patient infection status. The expected result is Negative. Fact Sheet for Patients:  PinkCheek.be Fact Sheet for Healthcare Providers: GravelBags.it This test is not yet approved or cleared by the [place] FDA and  has been authorized for detection and/or diagnosis of SARS-CoV-2  by FDA under an Emergency Use Authorization (EUA).  This EUA will remain in effect (meaning this test can be used) for th e duration of  the COVID-19 declaration under Section 564(b)(1) of the Act, 21 U.S.C. section 360bbb-3(b)(1), unless the authorization is terminated or revoked sooner.    Influenza A by PCR NEGATIVE NEGATIVE Final   Influenza B by PCR NEGATIVE NEGATIVE Final    Comment: (NOTE) The Xpert Xpress SARS-CoV-2/FLU/RSV assay is intended as an aid in  the diagnosis of influenza from Nasopharyngeal swab specimens and  should not be used as a sole basis for treatment. Nasal washings and  aspirates are unacceptable for Xpert Xpress SARS-CoV-2/FLU/RSV  testing. Fact Sheet for Patients: PinkCheek.be Fact Sheet for Healthcare Providers: GravelBags.it This test is not yet approved or cleared by the [place] FDA and  has been authorized for detection and/or diagnosis of SARS-CoV-2 by  FDA under an Emergency Use Authorization (EUA). This EUA will remain  in effect (meaning this test can be used) for the duration of the  Covid-19 declaration under Section 564(b)(1) of the Act, 21  U.S.C. section 360bbb-3(b)(1), unless the authorization is  terminated or revoked. Performed at [facility], [place] [address] [place], [place] [address]     Radiology Reports DG Chest 2 View  Result Date: 08/08/2019 CLINICAL DATA:  Per pt, states he has been SOB for 3 days-states his GF is admitted now for covid EXAM: CHEST - 2 VIEW COMPARISON:  09/24/2018 FINDINGS: Interval development of extensive moderate patchy airspace opacities with relative sparing of the apices. Heart size upper limits normal. No pneumothorax. No pleural effusion. Visualized bones unremarkable. IMPRESSION: New bilateral airspace disease. Electronically Signed   By: Lucrezia Europe M.D.   On: 08/08/2019 14:33   DG Ankle Complete Right  Result Date:  07/27/2019 CLINICAL DATA:  Lateral right ankle pain x4 days. EXAM: RIGHT ANKLE - COMPLETE 3+ VIEW COMPARISON:  None. FINDINGS: There is no evidence of fracture, dislocation, or joint effusion. Moderate severity degenerative changes seen along the dorsal aspect of the mid right foot. Soft tissues are unremarkable. IMPRESSION: 1. No acute osseous abnormality. Electronically Signed   By: Virgina Norfolk M.D.   On: 07/27/2019  15:41    Lab Data:  CBC: Recent Labs  Lab 08/08/19 1436 08/09/19 0400  WBC 6.2 3.5*  NEUTROABS 4.2 2.9  HGB 11.3* 10.3*  HCT 36.1* 34.2*  MCV 79.5* 80.5  PLT 201 878   Basic Metabolic Panel: Recent Labs  Lab 08/08/19 1436 08/09/19 0400  NA 135 132*  K 5.1 6.5*  CL 106 104  CO2 20* 17*  GLUCOSE 275* 406*  BUN 58* 71*  CREATININE 5.11* 5.74*  CALCIUM 8.3* 8.0*   GFR: Estimated Creatinine Clearance: 19.9 mL/min (A) (by C-G formula based on SCr of 5.74 mg/dL (H)). Liver Function Tests: Recent Labs  Lab 08/09/19 0400  AST 20  ALT 19  ALKPHOS 68  BILITOT 0.3  PROT 7.5  ALBUMIN 3.1*   No results for input(s): LIPASE, AMYLASE in the last 168 hours. No results for input(s): AMMONIA in the last 168 hours. Coagulation Profile: No results for input(s): INR, PROTIME in the last 168 hours. Cardiac Enzymes: No results for input(s): CKTOTAL, CKMB, CKMBINDEX, TROPONINI in the last 168 hours. BNP (last 3 results) No results for input(s): PROBNP in the last 8760 hours. HbA1C: Recent Labs    08/08/19 2126  HGBA1C 9.0*   CBG: Recent Labs  Lab 08/08/19 2105 08/09/19 0749 08/09/19 1139  GLUCAP 307* 366* 323*   Lipid Profile: Recent Labs    08/08/19 1502  TRIG 126   Thyroid Function Tests: No results for input(s): TSH, T4TOTAL, FREET4, T3FREE, THYROIDAB in the last 72 hours. Anemia Panel: Recent Labs    08/08/19 1502 08/09/19 0400  FERRITIN 453* 446*   Urine analysis:    Component Value Date/Time   COLORURINE YELLOW 08/09/2017 2141    APPEARANCEUR HAZY (A) 08/09/2017 2141   LABSPEC 1.017 08/09/2017 2141   PHURINE 5.0 08/09/2017 2141   GLUCOSEU 150 (A) 08/09/2017 2141   HGBUR MODERATE (A) 08/09/2017 2141   BILIRUBINUR negative 09/24/2018 0000   BILIRUBINUR NEGATIVE 11/26/2013 1147   KETONESUR negative 09/24/2018 0000   KETONESUR NEGATIVE 08/09/2017 2141   PROTEINUR negative 09/24/2018 0000   PROTEINUR 100 (A) 08/09/2017 2141   UROBILINOGEN 0.2 09/24/2018 0000   UROBILINOGEN 0.2 07/31/2012 1557   NITRITE Negative 09/24/2018 0000   NITRITE NEGATIVE 08/09/2017 2141   LEUKOCYTESUR Negative 09/24/2018 0000     Mega Kinkade M.D. [facility] 08/09/2019, 5:01 PM   Call night coverage person covering after 7pm

## 2019-08-09 NOTE — Consult Note (Addendum)
Reason for Consult: Renal failure Referring Physician:  Dr. Tana Coast  Chief Complaint:  Shortness of breath  Assessment/Plan: 1. AKI on CKDIV with a baseline that was already in the 3.3-3.7 range in early to mid 2020. He stopped going to see his nephrologist with [facility] bec he is a man of faith. If absolutely necessary he's willing to undergo dialysis but even now he's believing that it won't be necessary. His creatinine was 3.5 in 07/2018 at [place]. +NSAID + continued Lasix at home. - Let's order a renal ultrasound to r/o obstruction, check cortical thickness and echogenicity as well as size of kidneys. - Reasonable to give fluids for another 24 hrs as long as oxygen requirements don't increase + Lasix to help remove the potassium. - Lokelma daily.  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  - I warned him that in the next 24-48hrs we will likely need to have the discussion about RRT if his renal function doesn't turn around. 2. COVID+ - per primary team 3. DM 4. HTN 5. H/o sz    HPI: Jeremy [name] is an 54 y.o. male DM HTN HLD OSA Sz CKD4 with a baseline creatinine in the  3.3-3.5 range in early to mid 2020 presenting with worsening shortness of breath over the past few days prior to admission. By history the girlfriend has Covid and in the ED the patient also tested COVID+. He has nausea, myalgias and fevers as high as 103, anorexia and diarrhea. In the ED his sats were 95% on 3L and noted to be in renal failure with a Creatinine of 5.1. He has not been consuming solids but states that he has been drinking fluids. He had been using NSAID's for neck pain.   ROS Pertinent items are noted in HPI.  Chemistry and CBC: Creat  Date/Time Value Ref Range Status  05/31/2016 08:44 AM 2.77 (H) 0.60 - 1.35 mg/dL Final  04/15/2016 08:31 AM 2.29 (H) 0.60 - 1.35 mg/dL Final  03/29/2016 09:51 AM 2.10 (H)  0.60 - 1.35 mg/dL Final  02/09/2016 10:45 AM 1.87 (H) 0.60 - 1.35 mg/dL Final  02/14/2014 09:36 AM 1.54 (H) 0.50 - 1.35 mg/dL Final  01/31/2014 09:34 AM 1.79 (H) 0.50 - 1.35 mg/dL Final  10/26/2013 04:51 PM 1.55 (H) 0.50 - 1.35 mg/dL Final  06/08/2013 02:51 PM 1.33 0.50 - 1.35 mg/dL Final  04/23/2013 03:41 PM 1.54 (H) 0.50 - 1.35 mg/dL Final  06/02/2012 12:16 PM 1.29 0.50 - 1.35 mg/dL Final  12/30/2011 02:50 PM 1.39 (H) 0.50 - 1.35 mg/dL Final  11/25/2011 08:31 AM 1.50 (H) 0.50 - 1.35 mg/dL Final  11/15/2011 03:09 PM 1.63 (H) 0.50 - 1.35 mg/dL Final   Creatinine, Ser  Date/Time Value Ref Range Status  08/09/2019 04:00 AM 5.74 (H) 0.61 - 1.24 mg/dL Final  08/08/2019 02:36 PM 5.11 (H) 0.61 - 1.24 mg/dL Final  03/10/2019 10:56 AM 3.38 (H) 0.76 - 1.27 mg/dL Final  09/23/2018 03:47 PM 3.27 (H) 0.76 - 1.27 mg/dL Final  04/03/2018 08:42 AM 3.74 (H) 0.76 - 1.27 mg/dL Final  11/17/2017 03:18 PM 3.70 (H) 0.76 - 1.27 mg/dL Final  11/06/2017 04:09 PM 3.74 (H) 0.76 - 1.27 mg/dL Final  10/20/2017 10:23 AM 3.12 (H) 0.76 - 1.27 mg/dL Final  08/14/2017 02:50 PM 2.96 (H) 0.76 - 1.27 mg/dL Final  08/10/2017 03:46 AM 3.26 (H) 0.61 - 1.24 mg/dL Final  08/10/2017 02:48 AM 3.37 (H) 0.61 - 1.24 mg/dL Final  08/09/2017 08:42 PM 3.70 (H) 0.61 - 1.24 mg/dL Final  08/09/2017 08:28 PM 3.66 (H) 0.61 - 1.24 mg/dL Final  08/08/2017 04:12 AM 3.50 (H) 0.61 - 1.24 mg/dL Final  08/07/2017 06:22 AM 3.23 (H) 0.61 - 1.24 mg/dL Final  08/05/2017 05:06 PM 3.29 (H) 0.61 - 1.24 mg/dL Final  08/05/2017 02:05 PM 3.32 (H) 0.61 - 1.24 mg/dL Final  08/05/2017 09:15 AM 3.64 (H) 0.61 - 1.24 mg/dL Final  08/05/2017 05:18 AM 3.79 (H) 0.61 - 1.24 mg/dL Final  08/04/2017 11:51 PM 4.23 (H) 0.61 - 1.24 mg/dL Final  04/17/2017 04:40 PM 2.89 (H) 0.76 - 1.27 mg/dL Final  04/08/2017 05:01 PM 3.14 (H) 0.76 - 1.27 mg/dL Final  02/11/2017 08:55 AM 2.73 (H) 0.76 - 1.27 mg/dL Final  11/12/2011 06:45 AM 0.95 0.50 - 1.35 mg/dL Final  11/11/2011  09:47 AM 1.01 0.50 - 1.35 mg/dL Final   Recent Labs  Lab 08/08/19 1436 08/09/19 0400  NA 135 132*  K 5.1 6.5*  CL 106 104  CO2 20* 17*  GLUCOSE 275* 406*  BUN 58* 71*  CREATININE 5.11* 5.74*  CALCIUM 8.3* 8.0*   Recent Labs  Lab 08/08/19 1436 08/09/19 0400  WBC 6.2 3.5*  NEUTROABS 4.2 2.9  HGB 11.3* 10.3*  HCT 36.1* 34.2*  MCV 79.5* 80.5  PLT 201 187   Liver Function Tests: Recent Labs  Lab 08/09/19 0400  AST 20  ALT 19  ALKPHOS 68  BILITOT 0.3  PROT 7.5  ALBUMIN 3.1*   No results for input(s): LIPASE, AMYLASE in the last 168 hours. No results for input(s): AMMONIA in the last 168 hours. Cardiac Enzymes: No results for input(s): CKTOTAL, CKMB, CKMBINDEX, TROPONINI in the last 168 hours. Iron Studies:  Recent Labs    08/09/19 0400  FERRITIN 446*   PT/INR: '@LABRCNTIP' (inr:5)  Xrays/Other Studies: ) Results for orders placed or performed during the hospital encounter of 08/08/19 (from the past 48 hour(s))  Basic metabolic panel     Status: Abnormal   Collection Time: 08/08/19  2:36 PM  Result Value Ref Range   Sodium 135 135 - 145 mmol/L   Potassium 5.1 3.5 - 5.1 mmol/L   Chloride 106 98 - 111 mmol/L   CO2 20 (L) 22 - 32 mmol/L   Glucose, Bld 275 (H) 70 - 99 mg/dL   BUN 58 (H) 6 - 20 mg/dL   Creatinine, Ser 5.11 (H) 0.61 - 1.24 mg/dL   Calcium 8.3 (L) 8.9 - 10.3 mg/dL   GFR calc non Af Amer 12 (L) >60 mL/min   GFR calc Af Amer 14 (L) >60 mL/min   Anion gap 9 5 - 15    Comment: Performed at [facility], [place] [address] [place], [place] [address]  CBC with Differential     Status: Abnormal   Collection Time: 08/08/19  2:36 PM  Result Value Ref Range   WBC 6.2 4.0 - 10.5 K/uL   RBC 4.54 4.22 - 5.81 MIL/uL   Hemoglobin 11.3 (L) 13.0 - 17.0 g/dL   HCT 36.1 (L) 39.0 - 52.0 %   MCV 79.5 (L) 80.0 - 100.0 fL   MCH 24.9 (L) 26.0 - 34.0 pg   MCHC 31.3 30.0 - 36.0 g/dL   RDW 15.0 11.5 - 15.5 %   Platelets 201 150 - 400 K/uL   nRBC  0.0 0.0 - 0.2 %   Neutrophils Relative % 69 %  Neutro Abs 4.2 1.7 - 7.7 K/uL   Lymphocytes Relative 11 %   Lymphs Abs 0.7 0.7 - 4.0 K/uL   Monocytes Relative 19 %   Monocytes Absolute 1.2 (H) 0.1 - 1.0 K/uL   Eosinophils Relative 0 %   Eosinophils Absolute 0.0 0.0 - 0.5 K/uL   Basophils Relative 0 %   Basophils Absolute 0.0 0.0 - 0.1 K/uL   Immature Granulocytes 1 %   Abs Immature Granulocytes 0.05 0.00 - 0.07 K/uL    Comment: Performed at [facility], [place] [address] [place], [place] [address]  Lactic acid, plasma     Status: None   Collection Time: 08/08/19  3:02 PM  Result Value Ref Range   Lactic Acid, Venous 0.8 0.5 - 1.9 mmol/L    Comment: Performed at [facility], [place] [address], [place], [place] [address]  D-dimer, quantitative     Status: Abnormal   Collection Time: 08/08/19  3:02 PM  Result Value Ref Range   D-Dimer, Quant 3.35 (H) 0.00 - 0.50 ug/mL-FEU    Comment: (NOTE) At the manufacturer cut-off of 0.50 ug/mL FEU, this assay has been documented to exclude PE with a sensitivity and negative predictive value of 97 to 99%.  At this time, this assay has not been approved by the FDA to exclude DVT/VTE. Results should be correlated with clinical presentation. Performed at [facility], [place] [address], [place],  [address]   Procalcitonin     Status: None   Collection Time: 08/08/19  3:02 PM  Result Value Ref Range   Procalcitonin 0.35 ng/mL    Comment:        Interpretation: PCT (Procalcitonin) <= 0.5 ng/mL: Systemic infection (sepsis) is not likely. Local bacterial infection is possible. (NOTE)       Sepsis PCT Algorithm           Lower Respiratory Tract                                      Infection PCT Algorithm    ----------------------------     ----------------------------         PCT < 0.25 ng/mL                PCT < 0.10 ng/mL         Strongly encourage             Strongly discourage    discontinuation of antibiotics    initiation of antibiotics    ----------------------------     -----------------------------       PCT 0.25 - 0.50 ng/mL            PCT 0.10 - 0.25 ng/mL               OR       >80% decrease in PCT            Discourage initiation of                                            antibiotics      Encourage discontinuation           of antibiotics    ----------------------------     -----------------------------  PCT >= 0.50 ng/mL              PCT 0.26 - 0.50 ng/mL               AND        <80% decrease in PCT             Encourage initiation of                                             antibiotics       Encourage continuation           of antibiotics    ----------------------------     -----------------------------        PCT >= 0.50 ng/mL                  PCT > 0.50 ng/mL               AND         increase in PCT                  Strongly encourage                                      initiation of antibiotics    Strongly encourage escalation           of antibiotics                                     -----------------------------                                           PCT <= 0.25 ng/mL                                                 OR                                        > 80% decrease in PCT                                     Discontinue / Do not initiate                                             antibiotics Performed at [place] [address], [place], [place] [address]   Lactate dehydrogenase     Status: Abnormal   Collection Time: 08/08/19  3:02 PM  Result Value Ref Range   LDH 282 (H) 98 - 192 U/L    Comment: Performed at [facility], [place] [address], [place], [place] [address]  Ferritin  Status: Abnormal   Collection Time: 08/08/19  3:02 PM  Result Value Ref Range   Ferritin 453 (H) 24 - 336 ng/mL    Comment: Performed at [facility], [place] [address],  [place], [place] [address]  Triglycerides     Status: None   Collection Time: 08/08/19  3:02 PM  Result Value Ref Range   Triglycerides 126 <150 mg/dL    Comment: Performed at [facility], [place] [address], [place], [place] [address]  Fibrinogen     Status: Abnormal   Collection Time: 08/08/19  3:02 PM  Result Value Ref Range   Fibrinogen 714 (H) 210 - 475 mg/dL    Comment: Performed at [facility], [place] [address] [place], [place] [address]  C-reactive protein     Status: Abnormal   Collection Time: 08/08/19  3:02 PM  Result Value Ref Range   CRP 12.4 (H) <1.0 mg/dL    Comment: Performed at [facility], [place] [address] [place] [address], [place], [place] [address]  POC SARS Coronavirus 2 Ag-ED - Nasal Swab (BD Veritor Kit)     Status: None   Collection Time: 08/08/19  3:03 PM  Result Value Ref Range   SARS Coronavirus 2 Ag NEGATIVE NEGATIVE    Comment: (NOTE) SARS-CoV-2 antigen NOT DETECTED.  Negative results are presumptive.  Negative results do not preclude SARS-CoV-2 infection and should not be used as the sole basis for treatment or other patient management decisions, including infection  control decisions, particularly in the presence of clinical signs and  symptoms consistent with COVID-19, or in those who have been in contact with the virus.  Negative results must be combined with clinical observations, patient history, and epidemiological information. The expected result is Negative. Fact Sheet for Patients: PodPark.tn Fact Sheet for Healthcare Providers: GiftContent.is This test is not yet approved or cleared by the [place] FDA and  has been authorized for detection and/or diagnosis of SARS-CoV-2 by FDA under an Emergency Use Authorization (EUA).  This EUA will remain in effect (meaning this test can be used) for the duration of  the COVID-19 de claration under Section  564(b)(1) of the Act, 21 U.S.C. section 360bbb-3(b)(1), unless the authorization is terminated or revoked sooner.   Respiratory Panel by RT PCR (Flu A&B, Covid) - Nasopharyngeal Swab     Status: Abnormal   Collection Time: 08/08/19  3:36 PM   Specimen: Nasopharyngeal Swab  Result Value Ref Range   SARS Coronavirus 2 by RT PCR POSITIVE (A) NEGATIVE    Comment: RESULT CALLED TO, READ BACK BY AND VERIFIED WITH: Marcy Panning RN 1911 08/08/19 JM (NOTE) SARS-CoV-2 target nucleic acids are DETECTED. SARS-CoV-2 RNA is generally detectable in upper respiratory specimens  during the acute phase of infection. Positive results are indicative of the presence of the identified virus, but do not rule out bacterial infection or co-infection with other pathogens not detected by the test. Clinical correlation with patient history and other diagnostic information is necessary to determine patient infection status. The expected result is Negative. Fact Sheet for Patients:  PinkCheek.be Fact Sheet for Healthcare Providers: GravelBags.it This test is not yet approved or cleared by the [place] FDA and  has been authorized for detection and/or diagnosis of SARS-CoV-2 by FDA under an Emergency Use Authorization (EUA).  This EUA will remain in effect (meaning this test can be used) for th e duration of  the COVID-19 declaration under Section 564(b)(1) of the Act, 21  U.S.C. section 360bbb-3(b)(1), unless the authorization is terminated or revoked sooner.    Influenza A by PCR NEGATIVE NEGATIVE   Influenza B by PCR NEGATIVE NEGATIVE    Comment: (NOTE) The Xpert Xpress SARS-CoV-2/FLU/RSV assay is intended as an aid in  the diagnosis of influenza from Nasopharyngeal swab specimens and  should not be used as a sole basis for treatment. Nasal washings and  aspirates are unacceptable for Xpert Xpress SARS-CoV-2/FLU/RSV  testing. Fact Sheet for  Patients: PinkCheek.be Fact Sheet for Healthcare Providers: GravelBags.it This test is not yet approved or cleared by the [place] FDA and  has been authorized for detection and/or diagnosis of SARS-CoV-2 by  FDA under an Emergency Use Authorization (EUA). This EUA will remain  in effect (meaning this test can be used) for the duration of the  Covid-19 declaration under Section 564(b)(1) of the Act, 21  U.S.C. section 360bbb-3(b)(1), unless the authorization is  terminated or revoked. Performed at [facility], [place] [address] [place], [place] [address]   Glucose, capillary     Status: Abnormal   Collection Time: 08/08/19  9:05 PM  Result Value Ref Range   Glucose-Capillary 307 (H) 70 - 99 mg/dL  ABO/Rh     Status: None   Collection Time: 08/08/19  9:26 PM  Result Value Ref Range   ABO/RH(D)      O POS Performed at [facility], [place] [address], [place], [place] [address]   Hemoglobin A1c     Status: Abnormal   Collection Time: 08/08/19  9:26 PM  Result Value Ref Range   Hgb A1c MFr Bld 9.0 (H) 4.8 - 5.6 %    Comment: (NOTE) Pre diabetes:          5.7%-6.4% Diabetes:              >6.4% Glycemic control for   <7.0% adults with diabetes    Mean Plasma Glucose 211.6 mg/dL    Comment: Performed at [place] [address], [place], [place] [address]  CBC with Differential/Platelet     Status: Abnormal   Collection Time: 08/09/19  4:00 AM  Result Value Ref Range   WBC 3.5 (L) 4.0 - 10.5 K/uL   RBC 4.25 4.22 - 5.81 MIL/uL   Hemoglobin 10.3 (L) 13.0 - 17.0 g/dL   HCT 34.2 (L) 39.0 - 52.0 %   MCV 80.5 80.0 - 100.0 fL   MCH 24.2 (L) 26.0 - 34.0 pg   MCHC 30.1 30.0 - 36.0 g/dL   RDW 15.0 11.5 - 15.5 %   Platelets 187 150 - 400 K/uL   nRBC 0.0 0.0 - 0.2 %   Neutrophils Relative % 81 %   Neutro Abs 2.9 1.7 - 7.7 K/uL   Lymphocytes Relative 11 %   Lymphs Abs 0.4 (L) 0.7  - 4.0 K/uL   Monocytes Relative 7 %   Monocytes Absolute 0.3 0.1 - 1.0 K/uL   Eosinophils Relative 0 %   Eosinophils Absolute 0.0 0.0 - 0.5 K/uL   Basophils Relative 0 %   Basophils Absolute 0.0 0.0 - 0.1 K/uL   Immature Granulocytes 1 %   Abs Immature Granulocytes 0.02 0.00 - 0.07 K/uL    Comment: Performed at [facility], [place] [address] [place], [place] [address]  Comprehensive metabolic panel     Status: Abnormal   Collection Time: 08/09/19  4:00 AM  Result Value Ref Range   Sodium 132 (L) 135 -  145 mmol/L   Potassium 6.5 (HH) 3.5 - 5.1 mmol/L    Comment: CRITICAL RESULT CALLED TO, READ BACK BY AND VERIFIED WITH: RIMANDO, RN ON 08/09/19 @ 0519 BY LE    Chloride 104 98 - 111 mmol/L   CO2 17 (L) 22 - 32 mmol/L   Glucose, Bld 406 (H) 70 - 99 mg/dL   BUN 71 (H) 6 - 20 mg/dL   Creatinine, Ser 5.74 (H) 0.61 - 1.24 mg/dL   Calcium 8.0 (L) 8.9 - 10.3 mg/dL   Total Protein 7.5 6.5 - 8.1 g/dL   Albumin 3.1 (L) 3.5 - 5.0 g/dL   AST 20 15 - 41 U/L   ALT 19 0 - 44 U/L   Alkaline Phosphatase 68 38 - 126 U/L   Total Bilirubin 0.3 0.3 - 1.2 mg/dL   GFR calc non Af Amer 10 (L) >60 mL/min   GFR calc Af Amer 12 (L) >60 mL/min   Anion gap 11 5 - 15    Comment: Performed at [facility], [place] [address] [place], [place] [address]  C-reactive protein     Status: Abnormal   Collection Time: 08/09/19  4:00 AM  Result Value Ref Range   CRP 11.0 (H) <1.0 mg/dL    Comment: Performed at [name], [place] [address] [place], [place] [address]  D-dimer, quantitative (not at [facility])     Status: Abnormal   Collection Time: 08/09/19  4:00 AM  Result Value Ref Range   D-Dimer, Quant 2.64 (H) 0.00 - 0.50 ug/mL-FEU    Comment: (NOTE) At the manufacturer cut-off of 0.50 ug/mL FEU, this assay has been documented to exclude PE with a sensitivity and negative predictive value of 97 to 99%.  At this time, this assay has not been approved by the FDA  to exclude DVT/VTE. Results should be correlated with clinical presentation. Performed at [facility], [place] [address], [place], [place] [address]   Ferritin     Status: Abnormal   Collection Time: 08/09/19  4:00 AM  Result Value Ref Range   Ferritin 446 (H) 24 - 336 ng/mL    Comment: Performed at [facility], [place] [address] [place], [place] [address]  Glucose, capillary     Status: Abnormal   Collection Time: 08/09/19  7:49 AM  Result Value Ref Range   Glucose-Capillary 366 (H) 70 - 99 mg/dL  Glucose, capillary     Status: Abnormal   Collection Time: 08/09/19 11:39 AM  Result Value Ref Range   Glucose-Capillary 323 (H) 70 - 99 mg/dL   DG Chest 2 View  Result Date: 08/08/2019 CLINICAL DATA:  Per pt, states he has been SOB for 3 days-states his GF is admitted now for covid EXAM: CHEST - 2 VIEW COMPARISON:  09/24/2018 FINDINGS: Interval development of extensive moderate patchy airspace opacities with relative sparing of the apices. Heart size upper limits normal. No pneumothorax. No pleural effusion. Visualized bones unremarkable. IMPRESSION: New bilateral airspace disease. Electronically Signed   By: Lucrezia Europe M.D.   On: 08/08/2019 14:33    PMH:   Past Medical History:  Diagnosis Date  . Abscess   . AKI (acute kidney injury) ([place]) 08/05/2017  . Bell's palsy   . CKD (chronic kidney disease)    Dr. McDiarmind   . High cholesterol   . Hypertension   . Left shoulder pain 10/26/2013   S/p injection on 10/26/13   . Renal insufficiency 02/14/2014  .  Seizures ([facility]) 08/04/2017   "related to high blood sugars" (08/05/2017)  . Type II diabetes mellitus ([facility])     PSH:   Past Surgical History:  Procedure Laterality Date  . Eyelid surgery Right   . I & D EXTREMITY  11/11/2011   Procedure: IRRIGATION AND DEBRIDEMENT EXTREMITY;  Surgeon: Merrie Roof, MD;  Location: [facility];  Service: General;  Laterality: Right;  I&D Right Thigh Abscess     Allergies: No Known Allergies  Medications:   Prior to Admission medications   Medication Sig Start Date End Date Taking? Authorizing Provider  amLODipine (NORVASC) 10 MG tablet TAKE 1 TABLET BY MOUTH EVERY DAY Patient taking differently: Take 10 mg by mouth daily.  11/24/18  Yes Alcolu Bing, DO  aspirin EC 81 MG tablet Take 1 tablet (81 mg total) by mouth daily. 11/06/17  Yes Rogue Bussing, MD  atorvastatin (LIPITOR) 40 MG tablet Take 1 tablet (40 mg total) by mouth daily. 11/06/17  Yes Rogue Bussing, MD  carvedilol (COREG) 6.25 MG tablet TAKE 1 TABLET (6.25 MG TOTAL) BY MOUTH 2 (TWO) TIMES DAILY WITH A MEAL 10/05/18  Yes [place] Bing, DO  enalapril (VASOTEC) 5 MG tablet TAKE 1 TABLET BY MOUTH EVERY DAY Patient taking differently: Take 5 mg by mouth daily.  04/21/19  Yes Mullis, Kiersten P, DO  furosemide (LASIX) 80 MG tablet Take 1 tablet (80 mg total) by mouth 2 (two) times daily. 10/08/18  Yes McMullen, Grayling Congress, DO  HUMALOG KWIKPEN 100 UNIT/ML KwikPen INJECT 0-9 UNITS 3 TIMES DAILY AND AT BEDTIME WITH MEALS AS DIRECTED ON ATTACHED SHEET Patient taking differently: Inject 25 Units into the skin once a week.  06/03/18  Yes [place] Bing, DO  hydrALAZINE (APRESOLINE) 25 MG tablet Take 1 tablet (25 mg total) by mouth 3 (three) times daily. 12/01/18  Yes [place] Bing, DO  Levetiracetam 750 MG TB24 Take 2 tablets (1,500 mg total) by mouth daily. 02/04/19  Yes [place], Jinny Blossom, NP  liraglutide (VICTOZA) 18 MG/3ML SOPN Inject 0.1-0.2 mLs (0.6-1.2 mg total) into the skin daily. 0.6 mg once daily for 1 week,then increase to 1.2 mg once daily Patient taking differently: Inject 1.2 mg into the skin daily. 0.6 mg once daily for 1 week,then increase to 1.2 mg once daily 05/26/19  Yes Hensel, Jamal Collin, MD  polyethylene glycol powder (GLYCOLAX/MIRALAX) powder Take 17 g (or 1 tablespoon) daily mixed in water. Patient taking differently: Take 17 g by mouth daily as needed  for mild constipation, moderate constipation or severe constipation. Take 17 g (or 1 tablespoon) daily mixed in water. 11/06/17  Yes Rogue Bussing, MD  zolpidem (AMBIEN) 5 MG tablet Take 1 tablet (5 mg total) by mouth at bedtime as needed for sleep. 12/22/18  Yes [place] Bing, DO  glucose blood (TRUE METRIX BLOOD GLUCOSE TEST) test strip Check up to four times daily. 11/06/17   Rogue Bussing, MD  hydrALAZINE (APRESOLINE) 25 MG tablet TAKE 1 TABLET BY MOUTH THREE TIMES A DAY 04/28/18   [place] Bing, DO  insulin lispro (HUMALOG) 100 UNIT/ML injection Inject 0-9 Units into skin three times daily with meals. 06/25/18   [place] Bing, DO  Insulin Pen Needle (B-D ULTRAFINE III SHORT PEN) 31G X 8 MM MISC 1 application by Does not apply route 4 (four) times daily. 09/15/17   [place] Bing, DO  sildenafil (VIAGRA) 100 MG tablet Take 0.5-1 tablets (50-100 mg total) by mouth  daily as needed for erectile dysfunction. 09/23/18   [place] Bing, DO    Discontinued Meds:   Medications Discontinued During This Encounter  Medication Reason  . hydrALAZINE (APRESOLINE) tablet 25 mg Duplicate  . heparin injection 5,000 Units   . insulin detemir (LEVEMIR) injection 12 Units     Social History:  reports that he has never smoked. He has never used smokeless tobacco. He reports current alcohol use. He reports that he does not use drugs.  Family History:   Family History  Problem Relation Age of Onset  . Diabetes Mother   . Heart disease Mother 38  . Diabetes Sister   . Diabetes Brother   . Diabetes Brother   . Cancer Maternal Uncle        type unknown  . Stroke Neg Hx     Blood pressure (!) 143/79, pulse 72, temperature 98.3 F (36.8 C), temperature source Oral, resp. rate 20, height '5\' 8"'  (1.727 m), weight 131.5 kg, SpO2 95 %. General appearance: alert, cooperative and appears stated age Head: Normocephalic, without obvious abnormality, atraumatic Eyes: negative Neck:  no adenopathy, no carotid bruit, supple, symmetrical, trachea midline and thyroid not enlarged, symmetric, no tenderness/mass/nodules Back: symmetric, no curvature. ROM normal. No CVA tenderness. Resp: rhonchi bibasilar and bilaterally Cardio: regular rate and rhythm GI: obese Extremities: extremities normal, atraumatic, no cyanosis or edema Pulses: 2+ and symmetric Skin: Skin color, texture, turgor normal. No rashes or lesions       [place], Hunt Oris, MD 08/09/2019, 2:20 PM

## 2019-08-09 NOTE — Progress Notes (Signed)
Pharmacy: Remdesivir   Patient is a 54 y.o. male with COVID.  Pharmacy has been consulted for remdesivir dosing.   - CXR shows "New bilateral airspace disease."  -Pt requiring supplemental oxygen (Yes, 2L [place])  -ALT 19    A/P:  - Patient meets criteria for remdesivir. Will initiate remdesivir 200 mg once followed by 100 mg daily x 4 days.  - Daily CMET while on remdesivir - Will f/u pt's ALT and clinical condition

## 2019-08-09 NOTE — Plan of Care (Signed)
  Problem: Education: Goal: Knowledge of risk factors and measures for prevention of condition will improve Outcome: Progressing   

## 2019-08-09 NOTE — Progress Notes (Signed)
Inpatient Diabetes Program Recommendations  AACE/ADA: New Consensus Statement on Inpatient Glycemic Control (2015)  Target Ranges:  Prepandial:   less than 140 mg/dL      Peak postprandial:   less than 180 mg/dL (1-2 hours)      Critically ill patients:  140 - 180 mg/dL   Lab Results  Component Value Date   GLUCAP 366 (H) 08/09/2019   HGBA1C 9.0 (H) 08/08/2019    Review of Glycemic Control  Diabetes history: DM2 Outpatient Diabetes medications: Humalog 0-9 units tidwc (per pt taking 25 units weekly??)  and 0-5 units QHS, Victoza 1.2 mg QD Current orders for Inpatient glycemic control: Levemir 10 units QD, Novolog 0-15 units tidwc and 0-5 units QHS + 3 units tidwc.  On Decadron 6 mg QD HgbA1C - 9.0%     Up from 7.7% on 03/10/19  Inpatient Diabetes Program Recommendations:     Increase Levemir to 10 units bid Increase Novolog to 6 units tidwc for meal coverage insulin if pt eats > 50% meal.  Will speak with pt regarding his home diabetes meds and HgbA1C of 9.0%.  Thank you. Lorenda Peck, RD, LDN, CDE Inpatient Diabetes Coordinator [phone]

## 2019-08-10 DIAGNOSIS — N170 Acute kidney failure with tubular necrosis: Secondary | ICD-10-CM

## 2019-08-10 LAB — COMPREHENSIVE METABOLIC PANEL
ALT: 17 U/L (ref 0–44)
AST: 20 U/L (ref 15–41)
Albumin: 2.8 g/dL — ABNORMAL LOW (ref 3.5–5.0)
Alkaline Phosphatase: 61 U/L (ref 38–126)
Anion gap: 13 (ref 5–15)
BUN: 91 mg/dL — ABNORMAL HIGH (ref 6–20)
CO2: 15 mmol/L — ABNORMAL LOW (ref 22–32)
Calcium: 8.5 mg/dL — ABNORMAL LOW (ref 8.9–10.3)
Chloride: 106 mmol/L (ref 98–111)
Creatinine, Ser: 6.33 mg/dL — ABNORMAL HIGH (ref 0.61–1.24)
GFR calc Af Amer: 11 mL/min — ABNORMAL LOW (ref >60)
GFR calc non Af Amer: 9 mL/min — ABNORMAL LOW (ref >60)
Glucose, Bld: 252 mg/dL — ABNORMAL HIGH (ref 70–99)
Potassium: 5.3 mmol/L — ABNORMAL HIGH (ref 3.5–5.1)
Sodium: 134 mmol/L — ABNORMAL LOW (ref 135–145)
Total Bilirubin: 0.4 mg/dL (ref 0.3–1.2)
Total Protein: 7.1 g/dL (ref 6.5–8.1)

## 2019-08-10 LAB — CBC WITH DIFFERENTIAL/PLATELET
Abs Immature Granulocytes: 0.04 10*3/uL (ref 0.00–0.07)
Basophils Absolute: 0 10*3/uL (ref 0.0–0.1)
Basophils Relative: 0 %
Eosinophils Absolute: 0 10*3/uL (ref 0.0–0.5)
Eosinophils Relative: 0 %
HCT: 33 % — ABNORMAL LOW (ref 39.0–52.0)
Hemoglobin: 10.3 g/dL — ABNORMAL LOW (ref 13.0–17.0)
Immature Granulocytes: 1 %
Lymphocytes Relative: 11 %
Lymphs Abs: 0.6 10*3/uL — ABNORMAL LOW (ref 0.7–4.0)
MCH: 24.8 pg — ABNORMAL LOW (ref 26.0–34.0)
MCHC: 31.2 g/dL (ref 30.0–36.0)
MCV: 79.5 fL — ABNORMAL LOW (ref 80.0–100.0)
Monocytes Absolute: 0.7 10*3/uL (ref 0.1–1.0)
Monocytes Relative: 12 %
Neutro Abs: 4.3 10*3/uL (ref 1.7–7.7)
Neutrophils Relative %: 76 %
Platelets: 184 10*3/uL (ref 150–400)
RBC: 4.15 MIL/uL — ABNORMAL LOW (ref 4.22–5.81)
RDW: 15 % (ref 11.5–15.5)
WBC: 5.5 10*3/uL (ref 4.0–10.5)
nRBC: 0 % (ref 0.0–0.2)

## 2019-08-10 LAB — GLUCOSE, CAPILLARY
Glucose-Capillary: 255 mg/dL — ABNORMAL HIGH (ref 70–99)
Glucose-Capillary: 249 mg/dL — ABNORMAL HIGH (ref 70–99)
Glucose-Capillary: 268 mg/dL — ABNORMAL HIGH (ref 70–99)
Glucose-Capillary: 242 mg/dL — ABNORMAL HIGH (ref 70–99)

## 2019-08-10 LAB — FERRITIN: Ferritin: 661 ng/mL — ABNORMAL HIGH (ref 24–336)

## 2019-08-10 LAB — D-DIMER, QUANTITATIVE: D-Dimer, Quant: 2.3 ug/mL-FEU — ABNORMAL HIGH (ref 0.00–0.50)

## 2019-08-10 LAB — C-REACTIVE PROTEIN: CRP: 7.2 mg/dL — ABNORMAL HIGH (ref <1.0)

## 2019-08-10 MED ORDER — LEVETIRACETAM ER 500 MG PO TB24
1000.0000 mg | ORAL_TABLET | Freq: Every day | ORAL | Status: DC
  Administered 2019-08-11 – 2019-08-12 (×2): 1000 mg via ORAL
  Filled 2019-08-10 (×3): qty 2

## 2019-08-10 MED ORDER — INSULIN ASPART 100 UNIT/ML ~~LOC~~ SOLN
6.0000 [IU] | Freq: Three times a day (TID) | SUBCUTANEOUS | Status: DC
  Administered 2019-08-10 – 2019-08-13 (×9): 6 [IU] via SUBCUTANEOUS

## 2019-08-10 MED ORDER — SODIUM ZIRCONIUM CYCLOSILICATE 10 G PO PACK
10.0000 g | PACK | Freq: Two times a day (BID) | ORAL | Status: DC
  Administered 2019-08-10 – 2019-08-12 (×4): 10 g via ORAL
  Filled 2019-08-10 (×8): qty 1

## 2019-08-10 MED ORDER — INSULIN DETEMIR 100 UNIT/ML ~~LOC~~ SOLN
12.0000 [IU] | Freq: Two times a day (BID) | SUBCUTANEOUS | Status: DC
  Administered 2019-08-10 – 2019-08-11 (×2): 12 [IU] via SUBCUTANEOUS
  Filled 2019-08-10 (×3): qty 0.12

## 2019-08-10 NOTE — Progress Notes (Signed)
[facility] Progress Note    54 y.o. male DM HTN HLD OSA Sz CKD4 with a baseline creatinine in the  3.3-3.7 range in early to mid 2020 presenting with worsening shortness of breath over the past few days prior to admission. By history the girlfriend has Covid and in the ED the patient also tested COVID+. +NSAIDs as well.  Assessment/ Plan:   1. AKI on CKDIV with a baseline that was already in the 3.3-3.7 range in early to mid 2020. He stopped going to see his nephrologist with [facility] bec he is a man of faith and also COVID hit so he missed an appt and never rescheduled (new job). If absolutely necessary he's willing to undergo dialysis but even now he's believing that it won't be necessary. I spent a long time again today discussing the possibility of dialysis but he was having a hard time accepting that. His creatinine was 3.5 in 07/2018 at [place]. +NSAID + continued Lasix at home. - Renal ultrasound does not show  Obstruction. - Lokelma daily.  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  - I warned him again that we are going to have to make a decision about RRT if his renal function doesn't turn around; he still remains hopeful but he  Is hyperkalemic as well. 2. COVID+ - per primary team 3. DM 4. HTN 5. H/o sz  Subjective:   Some dyspnea when walking to restroom.  + hiccups    Objective:   BP (!) 141/75 (BP Location: Left Arm)   Pulse 73   Temp 98.2 F (36.8 C) (Oral)   Resp 20   Ht 5\' 8"  (1.727 m)   Wt 131.5 kg   SpO2 93%   BMI 44.09 kg/m   Intake/Output Summary (Last 24 hours) at 08/10/2019 1309 Last data filed at 08/10/2019 0500 Gross per 24 hour  Intake 1020.51 ml  Output --  Net 1020.51 ml   Weight change:   Physical Exam: GEN: NAD, A&Ox3, NCAT HEENT: No conjunctival pallor, EOMI NECK: Supple, no thyromegaly LUNGS: distant BS CV: RRR, No  M/R/G ABD: SNDNT +BS  EXT: Tr lower extremity edema    Imaging: DG Chest 2 View  Result Date: 08/08/2019 CLINICAL DATA:  Per pt, states he has been SOB for 3 days-states his GF is admitted now for covid EXAM: CHEST - 2 VIEW COMPARISON:  09/24/2018 FINDINGS: Interval development of extensive moderate patchy airspace opacities with relative sparing of the apices. Heart size upper limits normal. No pneumothorax. No pleural effusion. Visualized bones unremarkable. IMPRESSION: New bilateral airspace disease. Electronically Signed   By: Lucrezia Europe M.D.   On: 08/08/2019 14:33   US RENAL  Result Date: 08/09/2019 CLINICAL DATA:  Acute renal failure. Chronic kidney disease. Diabetes. COVID-19 positive. Hypertension. EXAM: RENAL / URINARY TRACT ULTRASOUND COMPLETE COMPARISON:  05/13/2016. FINDINGS: Right Kidney: Renal measurements: 11.7 x 6.7 x 6.0 cm = volume: 244 mL . Echogenicity within normal limits. No mass or hydronephrosis visualized. Left Kidney: Renal measurements: 12.7 x 6.2 x 6.0 cm = volume: 248 mL. Echogenicity within normal limits. Stable prominent column of Bertin. No mass or hydronephrosis visualized. Bladder: Appears normal for degree of bladder distention. Other: None. IMPRESSION: Normal examination, unchanged. Electronically Signed   By: Claudie Revering M.D.   On: 08/09/2019 19:38    Labs: [employer] Recent Labs  Lab 08/08/19  1436 08/09/19 0400 08/09/19 1849 08/10/19 0258  NA 135 132* 134* 134*  K 5.1 6.5* 5.5* 5.3*  CL 106 104 105 106  CO2 20* 17* 17* 15*  GLUCOSE 275* 406* 344* 252*  BUN 58* 71* 82* 91*  CREATININE 5.11* 5.74* 6.07* 6.33*  CALCIUM 8.3* 8.0* 8.5* 8.5*   CBC Recent Labs  Lab 08/08/19 1436 08/09/19 0400 08/10/19 0258  WBC 6.2 3.5* 5.5  NEUTROABS 4.2 2.9 4.3  HGB 11.3* 10.3* 10.3*  HCT 36.1* 34.2* 33.0*  MCV 79.5* 80.5 79.5*  PLT 201 187 184    Medications:    . albuterol  2 puff Inhalation Q6H  . amLODipine  10 mg Oral Daily  . vitamin C  500 mg Oral  Daily  . aspirin EC  81 mg Oral Daily  . atorvastatin  40 mg Oral Daily  . carvedilol  6.25 mg Oral BID WC  . dexamethasone (DECADRON) injection  6 mg Intravenous Q24H  . heparin  7,500 Units Subcutaneous Q8H  . hydrALAZINE  25 mg Oral TID  . insulin aspart  0-15 Units Subcutaneous TID WC  . insulin aspart  0-5 Units Subcutaneous QHS  . insulin aspart  6 Units Subcutaneous TID WC  . insulin detemir  12 Units Subcutaneous BID  . [START ON 08/11/2019] levETIRAcetam  1,000 mg Oral Daily  . zinc sulfate  220 mg Oral Daily      Otelia Santee, MD 08/10/2019, 1:09 PM

## 2019-08-10 NOTE — Progress Notes (Signed)
Inpatient Diabetes Program Recommendations  AACE/ADA: New Consensus Statement on Inpatient Glycemic Control (2015)  Target Ranges:  Prepandial:   less than 140 mg/dL      Peak postprandial:   less than 180 mg/dL (1-2 hours)      Critically ill patients:  140 - 180 mg/dL   Lab Results  Component Value Date   GLUCAP 255 (H) 08/10/2019   HGBA1C 9.0 (H) 08/08/2019    Review of Glycemic Control  Blood sugars today - 252, 255 mg/dL  Orders: Levemir 10 units bid, Novolog 0-15 units tidwc and 0-5 units QHS + 5 units tidwc  Inpatient Diabetes Program Recommendations:     Increase Levemir to 12 units bid  Continue to follow.  Thank you. Lorenda Peck, RD, LDN, CDE Inpatient Diabetes Coordinator [phone]

## 2019-08-10 NOTE — Progress Notes (Signed)
Pt creatinine level is trending up to 6.33, BUN 91 and potassium level 5.3, provider on call paged, no new order at this time. Pt is followed by nephrology. Will continue to monitor.

## 2019-08-10 NOTE — Progress Notes (Signed)
[facility]                                                                              Patient Demographics  Jeremy [name], is a 54 y.o. male, DOB - 07-10-1966, ZRA:[id number]  Admit date - 08/08/2019   Admitting Physician Ripudeep Krystal Eaton, MD  Outpatient Primary MD for the patient is Danna Hefty, DO  Outpatient specialists:   LOS - 2  days   Medical records reviewed and are as summarized below:    Chief Complaint  Patient presents with  . exposure to covid  . Shortness of Breath       Brief summary   Patient is a 54 year old male with history of insulin-dependent diabetes mellitus, hypertension, hyperlipidemia, obesity, OSA, seizure disorder, chronic kidney disease stage IV presented to ED with worsening shortness of breath for last 4 days.  Patient reports that his girlfriend has Covid. Patient states that he started having intermittent cough, chest tightness and shortness of breath since 08/04/2019.  He states nausea, myalgias, fevers (states had a fever of 103 F today), chills, poor appetite, diarrhea.  He does not use supplemental O2 at baseline COVID-19 test positive  Assessment & Plan    Principal Problem: Acute hypoxic respiratory failure due to acute COVID-19 viral pneumonia during the ongoing COVID-19 pandemic- POA - Patient presented with worsening shortness of breath, intermittent cough, chest tightness, myalgias, fevers, hypoxia.  Chest x-ray showed bilateral airspace disease -Still hypoxic, O2 sats 96% on 3 L. - Continue current management: Decadron 6 mg IV daily, Remdesivir per pharmacy protocol, day #3 today today - Continue Supportive care: vitamin C/zinc, albuterol, Tylenol. - Continue to wean oxygen, ambulatory O2 screening daily as tolerated  - Oxygen - SpO2: 96 % O2 Flow Rate (L/min): 3 L/min - Continue to follow labs as below  Lab Results  Component Value Date   SARSCOV2NAA POSITIVE (A) 08/08/2019     Recent Labs  Lab  08/08/19 1502 08/09/19 0400 08/10/19 0258  DDIMER 3.35* 2.64* 2.30*  FERRITIN 453* 446* 661*  CRP 12.4* 11.0* 7.2*  ALT  --  19 17  PROCALCITON 0.35  --   --     Active Problems: Acute kidney injury on CKD stage IV, with metabolic acidosis, hyperkalemia -Baseline creatinine 3.3-3.7, presenting with creatinine of 5.1 with metabolic acidosis -Reported poor appetite, diarrhea in the last 3 days prior to admission, likely acute component prerenal from dehydration and medications - Received IV fluid hydration with no significant improvement -Creatinine worsened to 6.3 today -Renal ultrasound showed no obstruction or hydronephrosis -Nephrology following, recommended Lokelma daily, continue to hold enalapril, may need to dialysis.     Uncontrolled type 2 diabetes mellitus with diabetic nephropathy, with long-term current use of insulin (HCC) presented with hyperglycemia -CBGs remain elevated likely due to IV steroids -Increase Levemir to 12 units twice daily, increase NovoLog meal coverage to 6 units 3 times daily AC, continue sliding scale insulin -Hemoglobin A1c 9.0  History of seizures -Continue Keppra, decrease dose due to renal insufficiency  Essential hypertension -BP currently stable, continue hydralazine, Coreg, amlodipine  -Hold furosemide, enalapril secondary to AKI on  CKD stage IV   Morbid obesity Estimated body mass index is 44.09 kg/m as calculated from the following:   Height as of this encounter: 5\' 8"  (1.727 m).   Weight as of this encounter: 131.5 kg.  Counseled on diet and weight control  Code Status: Full CODE STATUS DVT Prophylaxis: Heparin subcu Family Communication: Discussed all imaging results, lab results, explained to the patient   Disposition Plan:    Pending clinical status, currently inpatient given acute hypoxic respiratory failure, acute COVID-19 illness.  Once ambulatory without overt symptoms or hypoxia, creatinine improving would consider  discharge home.   Barriers : creatinine continues to trend up, currently on remdesivir day #3 today.  Remains inpatient  Time Spent in minutes 35 minutes  Procedures:  Renal ultrasound  Consultants:   Nephrology, Dr. Augustin Coupe  Antimicrobials:   Anti-infectives (From admission, onward)   Start     Dose/Rate Route Frequency Ordered Stop   08/10/19 1000  remdesivir 100 mg in sodium chloride 0.9 % 100 mL IVPB     100 mg 200 mL/hr over 30 Minutes Intravenous Daily 08/09/19 0554 08/14/19 0959   08/09/19 0600  remdesivir 200 mg in sodium chloride 0.9% 250 mL IVPB     200 mg 580 mL/hr over 30 Minutes Intravenous Once 08/09/19 0554 08/09/19 0941         Medications  Scheduled Meds: . albuterol  2 puff Inhalation Q6H  . amLODipine  10 mg Oral Daily  . vitamin C  500 mg Oral Daily  . aspirin EC  81 mg Oral Daily  . atorvastatin  40 mg Oral Daily  . carvedilol  6.25 mg Oral BID WC  . dexamethasone (DECADRON) injection  6 mg Intravenous Q24H  . heparin  7,500 Units Subcutaneous Q8H  . hydrALAZINE  25 mg Oral TID  . insulin aspart  0-15 Units Subcutaneous TID WC  . insulin aspart  0-5 Units Subcutaneous QHS  . insulin aspart  6 Units Subcutaneous TID WC  . insulin detemir  12 Units Subcutaneous BID  . [START ON 08/11/2019] levETIRAcetam  1,000 mg Oral Daily  . sodium zirconium cyclosilicate  10 g Oral BID  . zinc sulfate  220 mg Oral Daily   Continuous Infusions: . sodium chloride Stopped (08/09/19 2050)  . sodium chloride    . remdesivir 100 mg in NS 100 mL 100 mg (08/10/19 1232)   PRN Meds:.acetaminophen, chlorpheniramine-HYDROcodone, polyethylene glycol      Subjective:   Charleston Hankin was seen and examined today.  No fevers this morning, shortness of breath improving, still on 3 L O2 via nasal cannula.  Not accepting of worsening renal function and possibility of HD.  No acute issues overnight.  Objective:   Vitals:   08/09/19 2234 08/09/19 2354 08/10/19 0512 08/10/19  1340  BP: (!) 141/77  (!) 141/75 140/69  Pulse: 70 69 73 71  Resp:  20 20 18   Temp:   98.2 F (36.8 C) 98.6 F (37 C)  TempSrc:   Oral Oral  SpO2:  91% 93% 96%  Weight:      Height:        Intake/Output Summary (Last 24 hours) at 08/10/2019 1636 Last data filed at 08/10/2019 1600 Gross per 24 hour  Intake 1505.77 ml  Output 700 ml  Net 805.77 ml     Wt Readings from Last 3 Encounters:  08/08/19 131.5 kg  07/27/19 (!) 143.1 kg  05/25/19 (!) 137.3 kg    Physical Exam  General: Alert and oriented x 3, NAD  Eyes:   HEENT:  Atraumatic, normocephalic  Cardiovascular: S1 S2 clear,  RRR.  Trace pedal edema b/l  Respiratory: Decreased breath sound at the base  Gastrointestinal: Obese, soft, nontender, nondistended, NBS  Ext: Trace pedal edema bilaterally  Neuro: no new deficits  Musculoskeletal: No cyanosis, clubbing  Skin: No rashes  Psych: Normal affect and demeanor, alert and oriented x3     Data Reviewed:  I have personally reviewed following labs and imaging studies  Micro Results Recent Results (from the past 240 hour(s))  Respiratory Panel by RT PCR (Flu A&B, Covid) - Nasopharyngeal Swab     Status: Abnormal   Collection Time: 08/08/19  3:36 PM   Specimen: Nasopharyngeal Swab  Result Value Ref Range Status   SARS Coronavirus 2 by RT PCR POSITIVE (A) NEGATIVE Final    Comment: RESULT CALLED TO, READ BACK BY AND VERIFIED WITH: Marcy Panning RN 1911 08/08/19 JM (NOTE) SARS-CoV-2 target nucleic acids are DETECTED. SARS-CoV-2 RNA is generally detectable in upper respiratory specimens  during the acute phase of infection. Positive results are indicative of the presence of the identified virus, but do not rule out bacterial infection or co-infection with other pathogens not detected by the test. Clinical correlation with patient history and other diagnostic information is necessary to determine patient infection status. The expected result is Negative. Fact  Sheet for Patients:  PinkCheek.be Fact Sheet for Healthcare Providers: GravelBags.it This test is not yet approved or cleared by the [place] FDA and  has been authorized for detection and/or diagnosis of SARS-CoV-2 by FDA under an Emergency Use Authorization (EUA).  This EUA will remain in effect (meaning this test can be used) for th e duration of  the COVID-19 declaration under Section 564(b)(1) of the Act, 21 U.S.C. section 360bbb-3(b)(1), unless the authorization is terminated or revoked sooner.    Influenza A by PCR NEGATIVE NEGATIVE Final   Influenza B by PCR NEGATIVE NEGATIVE Final    Comment: (NOTE) The Xpert Xpress SARS-CoV-2/FLU/RSV assay is intended as an aid in  the diagnosis of influenza from Nasopharyngeal swab specimens and  should not be used as a sole basis for treatment. Nasal washings and  aspirates are unacceptable for Xpert Xpress SARS-CoV-2/FLU/RSV  testing. Fact Sheet for Patients: PinkCheek.be Fact Sheet for Healthcare Providers: GravelBags.it This test is not yet approved or cleared by the [place] FDA and  has been authorized for detection and/or diagnosis of SARS-CoV-2 by  FDA under an Emergency Use Authorization (EUA). This EUA will remain  in effect (meaning this test can be used) for the duration of the  Covid-19 declaration under Section 564(b)(1) of the Act, 21  U.S.C. section 360bbb-3(b)(1), unless the authorization is  terminated or revoked. Performed at [facility], [place] [address], [place], [place] [address]     Radiology Reports DG Chest 2 View  Result Date: 08/08/2019 CLINICAL DATA:  Per pt, states he has been SOB for 3 days-states his GF is admitted now for covid EXAM: CHEST - 2 VIEW COMPARISON:  09/24/2018 FINDINGS: Interval development of extensive moderate patchy airspace opacities with relative  sparing of the apices. Heart size upper limits normal. No pneumothorax. No pleural effusion. Visualized bones unremarkable. IMPRESSION: New bilateral airspace disease. Electronically Signed   By: Lucrezia Europe M.D.   On: 08/08/2019 14:33   DG Ankle Complete Right  Result Date: 07/27/2019 CLINICAL DATA:  Lateral right ankle pain x4 days. EXAM: RIGHT  ANKLE - COMPLETE 3+ VIEW COMPARISON:  None. FINDINGS: There is no evidence of fracture, dislocation, or joint effusion. Moderate severity degenerative changes seen along the dorsal aspect of the mid right foot. Soft tissues are unremarkable. IMPRESSION: 1. No acute osseous abnormality. Electronically Signed   By: Virgina Norfolk M.D.   On: 07/27/2019 15:55   US RENAL  Result Date: 08/09/2019 CLINICAL DATA:  Acute renal failure. Chronic kidney disease. Diabetes. COVID-19 positive. Hypertension. EXAM: RENAL / URINARY TRACT ULTRASOUND COMPLETE COMPARISON:  05/13/2016. FINDINGS: Right Kidney: Renal measurements: 11.7 x 6.7 x 6.0 cm = volume: 244 mL . Echogenicity within normal limits. No mass or hydronephrosis visualized. Left Kidney: Renal measurements: 12.7 x 6.2 x 6.0 cm = volume: 248 mL. Echogenicity within normal limits. Stable prominent column of Bertin. No mass or hydronephrosis visualized. Bladder: Appears normal for degree of bladder distention. Other: None. IMPRESSION: Normal examination, unchanged. Electronically Signed   By: Claudie Revering M.D.   On: 08/09/2019 19:38    Lab Data:  CBC: Recent Labs  Lab 08/08/19 1436 08/09/19 0400 08/10/19 0258  WBC 6.2 3.5* 5.5  NEUTROABS 4.2 2.9 4.3  HGB 11.3* 10.3* 10.3*  HCT 36.1* 34.2* 33.0*  MCV 79.5* 80.5 79.5*  PLT 201 187 010   Basic Metabolic Panel: Recent Labs  Lab 08/08/19 1436 08/09/19 0400 08/09/19 1849 08/10/19 0258  NA 135 132* 134* 134*  K 5.1 6.5* 5.5* 5.3*  CL 106 104 105 106  CO2 20* 17* 17* 15*  GLUCOSE 275* 406* 344* 252*  BUN 58* 71* 82* 91*  CREATININE 5.11* 5.74* 6.07*  6.33*  CALCIUM 8.3* 8.0* 8.5* 8.5*   GFR: Estimated Creatinine Clearance: 18.1 mL/min (A) (by C-G formula based on SCr of 6.33 mg/dL (H)). Liver Function Tests: Recent Labs  Lab 08/09/19 0400 08/10/19 0258  AST 20 20  ALT 19 17  ALKPHOS 68 61  BILITOT 0.3 0.4  PROT 7.5 7.1  ALBUMIN 3.1* 2.8*   No results for input(s): LIPASE, AMYLASE in the last 168 hours. No results for input(s): AMMONIA in the last 168 hours. Coagulation Profile: No results for input(s): INR, PROTIME in the last 168 hours. Cardiac Enzymes: No results for input(s): CKTOTAL, CKMB, CKMBINDEX, TROPONINI in the last 168 hours. BNP (last 3 results) No results for input(s): PROBNP in the last 8760 hours. HbA1C: Recent Labs    08/08/19 2126  HGBA1C 9.0*   CBG: Recent Labs  Lab 08/09/19 1139 08/09/19 1720 08/09/19 1957 08/10/19 0805 08/10/19 1135  GLUCAP 323* 346* 261* 255* 249*   Lipid Profile: Recent Labs    08/08/19 1502  TRIG 126   Thyroid Function Tests: No results for input(s): TSH, T4TOTAL, FREET4, T3FREE, THYROIDAB in the last 72 hours. Anemia Panel: Recent Labs    08/09/19 0400 08/10/19 0258  FERRITIN 446* 661*   Urine analysis:    Component Value Date/Time   COLORURINE YELLOW 08/09/2017 2141   APPEARANCEUR HAZY (A) 08/09/2017 2141   LABSPEC 1.017 08/09/2017 2141   PHURINE 5.0 08/09/2017 2141   GLUCOSEU 150 (A) 08/09/2017 2141   HGBUR MODERATE (A) 08/09/2017 2141   BILIRUBINUR negative 09/24/2018 0000   BILIRUBINUR NEGATIVE 11/26/2013 1147   KETONESUR negative 09/24/2018 0000   KETONESUR NEGATIVE 08/09/2017 2141   PROTEINUR negative 09/24/2018 0000   PROTEINUR 100 (A) 08/09/2017 2141   UROBILINOGEN 0.2 09/24/2018 0000   UROBILINOGEN 0.2 07/31/2012 1557   NITRITE Negative 09/24/2018 0000   NITRITE NEGATIVE 08/09/2017 2141   LEUKOCYTESUR Negative 09/24/2018 0000  Estill Cotta M.D. [facility] 08/10/2019, 4:36 PM   Call night coverage person covering after 7pm

## 2019-08-11 DIAGNOSIS — J9602 Acute respiratory failure with hypercapnia: Secondary | ICD-10-CM

## 2019-08-11 DIAGNOSIS — N179 Acute kidney failure, unspecified: Secondary | ICD-10-CM

## 2019-08-11 DIAGNOSIS — N184 Chronic kidney disease, stage 4 (severe): Secondary | ICD-10-CM

## 2019-08-11 DIAGNOSIS — G4733 Obstructive sleep apnea (adult) (pediatric): Secondary | ICD-10-CM

## 2019-08-11 LAB — FERRITIN: Ferritin: 556 ng/mL — ABNORMAL HIGH (ref 24–336)

## 2019-08-11 LAB — CBC WITH DIFFERENTIAL/PLATELET
Abs Immature Granulocytes: 0.04 10*3/uL (ref 0.00–0.07)
Basophils Absolute: 0 10*3/uL (ref 0.0–0.1)
Basophils Relative: 0 %
Eosinophils Absolute: 0 10*3/uL (ref 0.0–0.5)
Eosinophils Relative: 0 %
HCT: 32.9 % — ABNORMAL LOW (ref 39.0–52.0)
Hemoglobin: 10.4 g/dL — ABNORMAL LOW (ref 13.0–17.0)
Immature Granulocytes: 1 %
Lymphocytes Relative: 5 %
Lymphs Abs: 0.4 10*3/uL — ABNORMAL LOW (ref 0.7–4.0)
MCH: 24.6 pg — ABNORMAL LOW (ref 26.0–34.0)
MCHC: 31.6 g/dL (ref 30.0–36.0)
MCV: 77.8 fL — ABNORMAL LOW (ref 80.0–100.0)
Monocytes Absolute: 0.8 10*3/uL (ref 0.1–1.0)
Monocytes Relative: 10 %
Neutro Abs: 7.1 10*3/uL (ref 1.7–7.7)
Neutrophils Relative %: 84 %
Platelets: 206 10*3/uL (ref 150–400)
RBC: 4.23 MIL/uL (ref 4.22–5.81)
RDW: 14.9 % (ref 11.5–15.5)
WBC: 8.3 10*3/uL (ref 4.0–10.5)
nRBC: 0 % (ref 0.0–0.2)

## 2019-08-11 LAB — COMPREHENSIVE METABOLIC PANEL
ALT: 16 U/L (ref 0–44)
AST: 19 U/L (ref 15–41)
Albumin: 3 g/dL — ABNORMAL LOW (ref 3.5–5.0)
Alkaline Phosphatase: 59 U/L (ref 38–126)
Anion gap: 13 (ref 5–15)
BUN: 117 mg/dL — ABNORMAL HIGH (ref 6–20)
CO2: 15 mmol/L — ABNORMAL LOW (ref 22–32)
Calcium: 8.3 mg/dL — ABNORMAL LOW (ref 8.9–10.3)
Chloride: 105 mmol/L (ref 98–111)
Creatinine, Ser: 6.68 mg/dL — ABNORMAL HIGH (ref 0.61–1.24)
GFR calc Af Amer: 10 mL/min — ABNORMAL LOW (ref >60)
GFR calc non Af Amer: 9 mL/min — ABNORMAL LOW (ref >60)
Glucose, Bld: 216 mg/dL — ABNORMAL HIGH (ref 70–99)
Potassium: 4.6 mmol/L (ref 3.5–5.1)
Sodium: 133 mmol/L — ABNORMAL LOW (ref 135–145)
Total Bilirubin: 0.5 mg/dL (ref 0.3–1.2)
Total Protein: 7.1 g/dL (ref 6.5–8.1)

## 2019-08-11 LAB — GLUCOSE, CAPILLARY
Glucose-Capillary: 223 mg/dL — ABNORMAL HIGH (ref 70–99)
Glucose-Capillary: 227 mg/dL — ABNORMAL HIGH (ref 70–99)
Glucose-Capillary: 192 mg/dL — ABNORMAL HIGH (ref 70–99)
Glucose-Capillary: 208 mg/dL — ABNORMAL HIGH (ref 70–99)

## 2019-08-11 LAB — C-REACTIVE PROTEIN: CRP: 4.1 mg/dL — ABNORMAL HIGH (ref <1.0)

## 2019-08-11 LAB — D-DIMER, QUANTITATIVE: D-Dimer, Quant: 1.92 ug/mL-FEU — ABNORMAL HIGH (ref 0.00–0.50)

## 2019-08-11 MED ORDER — PANTOPRAZOLE SODIUM 40 MG PO TBEC
40.0000 mg | DELAYED_RELEASE_TABLET | Freq: Every day | ORAL | Status: DC
  Administered 2019-08-11 – 2019-08-13 (×3): 40 mg via ORAL
  Filled 2019-08-11 (×3): qty 1

## 2019-08-11 MED ORDER — INSULIN DETEMIR 100 UNIT/ML ~~LOC~~ SOLN
16.0000 [IU] | Freq: Two times a day (BID) | SUBCUTANEOUS | Status: DC
  Administered 2019-08-11 – 2019-08-12 (×2): 16 [IU] via SUBCUTANEOUS
  Filled 2019-08-11 (×3): qty 0.16

## 2019-08-11 MED ORDER — HEPARIN SODIUM (PORCINE) 5000 UNIT/ML IJ SOLN
7500.0000 [IU] | Freq: Three times a day (TID) | INTRAMUSCULAR | Status: AC
  Administered 2019-08-11: 7500 [IU] via SUBCUTANEOUS
  Filled 2019-08-11: qty 2

## 2019-08-11 MED ORDER — SIMETHICONE 80 MG PO CHEW
80.0000 mg | CHEWABLE_TABLET | Freq: Four times a day (QID) | ORAL | Status: DC
  Administered 2019-08-11 – 2019-08-13 (×9): 80 mg via ORAL
  Filled 2019-08-11 (×9): qty 1

## 2019-08-11 MED ORDER — DEXTROSE 5 % IV SOLN
3.0000 g | INTRAVENOUS | Status: DC
  Filled 2019-08-11: qty 3000

## 2019-08-11 MED ORDER — ALBUTEROL SULFATE HFA 108 (90 BASE) MCG/ACT IN AERS
2.0000 | INHALATION_SPRAY | Freq: Three times a day (TID) | RESPIRATORY_TRACT | Status: DC
  Administered 2019-08-11 – 2019-08-13 (×7): 2 via RESPIRATORY_TRACT
  Filled 2019-08-11: qty 6.7

## 2019-08-11 NOTE — Progress Notes (Signed)
Spoke with daughter Charlena Cross this morning about pt status. Daughter upset that father will not agree to dialysis. Stated that she will have her aunt talk to him to see if he will change his mind. Last night pt had episode of sob r/t ambulating to the RR. Writer explained to pt to keep o2 on since he has extended cords. Took a minute for pt to recover. Pt dranks a lot of fluids throughout the night and this Probation officer educated that he should limit his fluid intake. Writer to send md a msg as a fluid restriction order would benefit him.  Other wise, pt irritated, stating he just wants to go home and go to work. No other issues or concerns at this time.

## 2019-08-11 NOTE — Progress Notes (Signed)
Patients daughter Charlena Cross called and updated with patients permission.  Patient waiting for transfer to [facility] for HD - patient and daughter aware and agreeable

## 2019-08-11 NOTE — Progress Notes (Signed)
Inpatient Diabetes Program Recommendations  AACE/ADA: New Consensus Statement on Inpatient Glycemic Control (2015)  Target Ranges:  Prepandial:   less than 140 mg/dL      Peak postprandial:   less than 180 mg/dL (1-2 hours)      Critically ill patients:  140 - 180 mg/dL   Lab Results  Component Value Date   GLUCAP 223 (H) 08/11/2019   HGBA1C 9.0 (H) 08/08/2019    Review of Glycemic Control  Blood sugars above goal of 140-180 mg/dL.  Inpatient Diabetes Program Recommendations:     Increase Novolog to 8 units tidwc.  Will continue to follow.  Thank you. Lorenda Peck, RD, LDN, CDE Inpatient Diabetes Coordinator [phone]

## 2019-08-11 NOTE — Progress Notes (Addendum)
PROGRESS NOTE    Jeremy [name]   VPX:[id number]  DOB: 1966-05-25  DOA: 08/08/2019 PCP: Danna Hefty, DO   Brief Narrative:  Jeremy [name] 54 y.o.maleDM HTN HLD OSA Sz CKD4 with a baseline creatininein the 3.3-3.7range in early to mid 2020 presenting with worsening shortness of breath over the past few days prior to admission. By history the girlfriend has Covid and in the ED the patient also tested COVID+.     Subjective: No complaints    Assessment & Plan:   Principal Problem:   Acute respiratory failure due to COVID 19 pneumonia -Chest x-ray on 1/17 showed bilateral airspace opacities - Continue Remdesivir and Decadron which were started on 1/18 and should continue for 5 days - He remains hypoxic and requires about 5 L of O2 at this time - cont Vit C, Zinc, Albuterol  Active Problems:    AKI on Chronic kidney disease (CKD), stage IV with acute metabolic acidosis and hyperkalemia -Occurring in the setting of Lasix and enalapril use, underlying and hypertensive diabetic nephropathy -Baseline creatinine is between 3 and 3.7 - Unfortunately, his Cr has been steadily rising from 5.1 and now is 6.68 -Renal ultrasound is unrevealing -  nephrology recommends that he be transferred to [facility] in case dialysis is needed -Potassium was as high as 6.5 and has improved to 4.6 with Lokelma  Hiccups -Patient has been intermittently having hiccups which he states are very bothersome for him-they may be related directly to acute renal failure however I will see if Protonix and Mylicon will help  Essential hypertension -Continue amlodipine, hydralazine and carvedilol -Enalapril and furosemide remain on hold  Seizure disorder -Continue Keppra    Uncontrolled type 2 diabetes mellitus with diabetic nephropathy, with long-term current use of insulin ([place]) -Patient takes Victoza & lispro per his med rec - Last A1c 9.0 on 08/08/19 -Currently receiving Lantus along with mealtime  NovoLog and NovoLog sliding scale -We will need to increase Levemir as sugars are still quite high    OSA (obstructive sleep apnea) -Listed in chart-not sure if he uses a CPAP at home however I will go ahead and order 1 while he is here  Orbit obesity -Body mass index is 44.09 kg/m.    Time spent in minutes: 35 DVT prophylaxis: Heparin Code Status: Full code Family Communication:  Disposition Plan: Transfer to [facility] Consultants:   Nephrology Procedures:    Antimicrobials:  Anti-infectives (From admission, onward)   Start     Dose/Rate Route Frequency Ordered Stop   08/10/19 1000  remdesivir 100 mg in sodium chloride 0.9 % 100 mL IVPB     100 mg 200 mL/hr over 30 Minutes Intravenous Daily 08/09/19 0554 08/14/19 0959   08/09/19 0600  remdesivir 200 mg in sodium chloride 0.9% 250 mL IVPB     200 mg 580 mL/hr over 30 Minutes Intravenous Once 08/09/19 0554 08/09/19 0941       Objective: Vitals:   08/10/19 2018 08/11/19 0000 08/11/19 0525 08/11/19 1346  BP: (!) 141/80 (!) 146/87 (!) 151/82 (!) 144/79  Pulse: 79 78 76 73  Resp: 19  20 20   Temp: 98 F (36.7 C)  97.9 F (36.6 C) 98.4 F (36.9 C)  TempSrc: Oral  Oral Oral  SpO2: 92% (!) 87% 93% 93%  Weight:      Height:        Intake/Output Summary (Last 24 hours) at 08/11/2019 1414 Last data filed at 08/11/2019 1348 Gross per 24 hour  Intake 205.26 ml  Output 400 ml  Net -194.74 ml   Filed Weights   08/08/19 1322 08/08/19 1510  Weight: 132.9 kg 131.5 kg    Examination: General exam: Appears comfortable  HEENT: PERRLA, oral mucosa moist, no sclera icterus or thrush Respiratory system: Clear to auscultation. Respiratory effort normal. Cardiovascular system: S1 & S2 heard, RRR.   Gastrointestinal system: Abdomen soft, non-tender, nondistended. Normal bowel sounds. Central nervous system: Alert and oriented. No focal neurological deficits. Extremities: No cyanosis, clubbing or edema Skin: No  rashes or ulcers Psychiatry:  Mood & affect appropriate.     Data Reviewed: I have personally reviewed following labs and imaging studies  CBC: Recent Labs  Lab 08/08/19 1436 08/09/19 0400 08/10/19 0258 08/11/19 0240  WBC 6.2 3.5* 5.5 8.3  NEUTROABS 4.2 2.9 4.3 7.1  HGB 11.3* 10.3* 10.3* 10.4*  HCT 36.1* 34.2* 33.0* 32.9*  MCV 79.5* 80.5 79.5* 77.8*  PLT 201 187 184 903   Basic Metabolic Panel: Recent Labs  Lab 08/08/19 1436 08/09/19 0400 08/09/19 1849 08/10/19 0258 08/11/19 0240  NA 135 132* 134* 134* 133*  K 5.1 6.5* 5.5* 5.3* 4.6  CL 106 104 105 106 105  CO2 20* 17* 17* 15* 15*  GLUCOSE 275* 406* 344* 252* 216*  BUN 58* 71* 82* 91* 117*  CREATININE 5.11* 5.74* 6.07* 6.33* 6.68*  CALCIUM 8.3* 8.0* 8.5* 8.5* 8.3*   GFR: Estimated Creatinine Clearance: 17.1 mL/min (A) (by C-G formula based on SCr of 6.68 mg/dL (H)). Liver Function Tests: Recent Labs  Lab 08/09/19 0400 08/10/19 0258 08/11/19 0240  AST 20 20 19   ALT 19 17 16   ALKPHOS 68 61 59  BILITOT 0.3 0.4 0.5  PROT 7.5 7.1 7.1  ALBUMIN 3.1* 2.8* 3.0*   No results for input(s): LIPASE, AMYLASE in the last 168 hours. No results for input(s): AMMONIA in the last 168 hours. Coagulation Profile: No results for input(s): INR, PROTIME in the last 168 hours. Cardiac Enzymes: No results for input(s): CKTOTAL, CKMB, CKMBINDEX, TROPONINI in the last 168 hours. BNP (last 3 results) No results for input(s): PROBNP in the last 8760 hours. HbA1C: Recent Labs    08/08/19 2126  HGBA1C 9.0*   CBG: Recent Labs  Lab 08/10/19 1135 08/10/19 1717 08/10/19 2014 08/11/19 0758 08/11/19 1109  GLUCAP 249* 268* 242* 223* 227*   Lipid Profile: Recent Labs    08/08/19 1502  TRIG 126   Thyroid Function Tests: No results for input(s): TSH, T4TOTAL, FREET4, T3FREE, THYROIDAB in the last 72 hours. Anemia Panel: Recent Labs    08/10/19 0258 08/11/19 0240  FERRITIN 661* 556*   Urine analysis:    Component  Value Date/Time   COLORURINE YELLOW 08/09/2017 2141   APPEARANCEUR HAZY (A) 08/09/2017 2141   LABSPEC 1.017 08/09/2017 2141   PHURINE 5.0 08/09/2017 2141   GLUCOSEU 150 (A) 08/09/2017 2141   HGBUR MODERATE (A) 08/09/2017 2141   BILIRUBINUR negative 09/24/2018 0000   BILIRUBINUR NEGATIVE 11/26/2013 1147   KETONESUR negative 09/24/2018 0000   KETONESUR NEGATIVE 08/09/2017 2141   PROTEINUR negative 09/24/2018 0000   PROTEINUR 100 (A) 08/09/2017 2141   UROBILINOGEN 0.2 09/24/2018 0000   UROBILINOGEN 0.2 07/31/2012 1557   NITRITE Negative 09/24/2018 0000   NITRITE NEGATIVE 08/09/2017 2141   LEUKOCYTESUR Negative 09/24/2018 0000   Sepsis Labs: @LABRCNTIP (procalcitonin:4,lacticidven:4) ) Recent Results (from the past 240 hour(s))  Respiratory Panel by RT PCR (Flu A&B, Covid) - Nasopharyngeal Swab     Status: Abnormal  Collection Time: 08/08/19  3:36 PM   Specimen: Nasopharyngeal Swab  Result Value Ref Range Status   SARS Coronavirus 2 by RT PCR POSITIVE (A) NEGATIVE Final    Comment: RESULT CALLED TO, READ BACK BY AND VERIFIED WITH: Marcy Panning RN 1911 08/08/19 JM (NOTE) SARS-CoV-2 target nucleic acids are DETECTED. SARS-CoV-2 RNA is generally detectable in upper respiratory specimens  during the acute phase of infection. Positive results are indicative of the presence of the identified virus, but do not rule out bacterial infection or co-infection with other pathogens not detected by the test. Clinical correlation with patient history and other diagnostic information is necessary to determine patient infection status. The expected result is Negative. Fact Sheet for Patients:  PinkCheek.be Fact Sheet for Healthcare Providers: GravelBags.it This test is not yet approved or cleared by the [place] FDA and  has been authorized for detection and/or diagnosis of SARS-CoV-2 by FDA under an Emergency Use Authorization (EUA).   This EUA will remain in effect (meaning this test can be used) for th e duration of  the COVID-19 declaration under Section 564(b)(1) of the Act, 21 U.S.C. section 360bbb-3(b)(1), unless the authorization is terminated or revoked sooner.    Influenza A by PCR NEGATIVE NEGATIVE Final   Influenza B by PCR NEGATIVE NEGATIVE Final    Comment: (NOTE) The Xpert Xpress SARS-CoV-2/FLU/RSV assay is intended as an aid in  the diagnosis of influenza from Nasopharyngeal swab specimens and  should not be used as a sole basis for treatment. Nasal washings and  aspirates are unacceptable for Xpert Xpress SARS-CoV-2/FLU/RSV  testing. Fact Sheet for Patients: PinkCheek.be Fact Sheet for Healthcare Providers: GravelBags.it This test is not yet approved or cleared by the [place] FDA and  has been authorized for detection and/or diagnosis of SARS-CoV-2 by  FDA under an Emergency Use Authorization (EUA). This EUA will remain  in effect (meaning this test can be used) for the duration of the  Covid-19 declaration under Section 564(b)(1) of the Act, 21  U.S.C. section 360bbb-3(b)(1), unless the authorization is  terminated or revoked. Performed at [facility], [place] [address], [place], [place] [address]          Radiology Studies: US RENAL  Result Date: 08/09/2019 CLINICAL DATA:  Acute renal failure. Chronic kidney disease. Diabetes. COVID-19 positive. Hypertension. EXAM: RENAL / URINARY TRACT ULTRASOUND COMPLETE COMPARISON:  05/13/2016. FINDINGS: Right Kidney: Renal measurements: 11.7 x 6.7 x 6.0 cm = volume: 244 mL . Echogenicity within normal limits. No mass or hydronephrosis visualized. Left Kidney: Renal measurements: 12.7 x 6.2 x 6.0 cm = volume: 248 mL. Echogenicity within normal limits. Stable prominent column of Bertin. No mass or hydronephrosis visualized. Bladder: Appears normal for degree of bladder  distention. Other: None. IMPRESSION: Normal examination, unchanged. Electronically Signed   By: Claudie Revering M.D.   On: 08/09/2019 19:38      Scheduled Meds: . albuterol  2 puff Inhalation TID  . amLODipine  10 mg Oral Daily  . vitamin C  500 mg Oral Daily  . aspirin EC  81 mg Oral Daily  . atorvastatin  40 mg Oral Daily  . carvedilol  6.25 mg Oral BID WC  . dexamethasone (DECADRON) injection  6 mg Intravenous Q24H  . heparin  7,500 Units Subcutaneous Q8H  . hydrALAZINE  25 mg Oral TID  . insulin aspart  0-15 Units Subcutaneous TID WC  . insulin aspart  0-5 Units Subcutaneous QHS  . insulin aspart  6 Units Subcutaneous TID WC  . insulin detemir  12 Units Subcutaneous BID  . levETIRAcetam  1,000 mg Oral Daily  . sodium zirconium cyclosilicate  10 g Oral BID  . zinc sulfate  220 mg Oral Daily   Continuous Infusions: . sodium chloride Stopped (08/09/19 2050)  . sodium chloride    . remdesivir 100 mg in NS 100 mL 100 mg (08/11/19 0852)     LOS: 3 days      Debbe Odea, MD [facility] Pager: [email or web address] Password [facility] 08/11/2019, 2:14 PM

## 2019-08-11 NOTE — Progress Notes (Signed)
CPAP on hold c/o being tested positive for COVID+

## 2019-08-11 NOTE — Progress Notes (Signed)
Patient planned for transfer to [facility] from [facility] to initiate HD - request to IR for tunneled HD catheter placement once transferred.  Will plan for [facility] placement tomorrow (1/21) in IR, due to Covid (+) status will likely be end of day. Patient to be NPO at midnight, hold SQ heparin after tonight's 2200 dose, hold any other anticoagulation until post procedure.   Will see for consult tomorrow.  Please call IR with questions or concerns.   Candiss Norse, PA-C

## 2019-08-11 NOTE — Plan of Care (Signed)
  Problem: Education: Goal: Knowledge of risk factors and measures for prevention of condition will improve Outcome: Progressing   Problem: Coping: Goal: Psychosocial and spiritual needs will be supported Outcome: Progressing   Problem: Respiratory: Goal: Will maintain a patent airway Outcome: Progressing Goal: Complications related to the disease process, condition or treatment will be avoided or minimized Outcome: Progressing   

## 2019-08-11 NOTE — Progress Notes (Signed)
[facility] Progress Note   54 y.o.maleDM HTN HLD OSA Sz CKD4 with a baseline creatininein the 3.3-3.7 range in early to mid 2020 presenting with worsening shortness of breath over the past few days prior to admission. By history the girlfriend has Covid and in the ED the patient also tested COVID+. +NSAIDs as well.  Assessment/ Plan:   1. AKI on CKDIV with a baseline that was already in the 3.3-3.7 range in early to mid 2020. He stopped going to see his nephrologist with [facility] bec he is a man of faith and also COVID hit so he missed an appt and never rescheduled (new job). If absolutely necessary he's willing to undergo dialysis but even now he's believing that it won't be necessary. I spent a long time again today discussing the possibility of dialysis but he was having a hard time accepting that. His creatinine was 3.5 in 07/2018 at [place]. +NSAID + continued Lasix at home. - Renal ultrasound does not show  Obstruction. - Lokelmadaily.  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  - I again talked with  him at length and he's a little more amenbale to starting RRT.  - Will request VIR TC for HD; there's a good possibility he won't recover function to come off RRT as he had very advanced disease to begin with.  - Transfer to [facility] to initiate iHD.  2. COVID+ - per primary team 3. DM 4. HTN 5. H/o sz  Subjective:   Some dyspnea when walking to restroom; he attributes it to getting excited.  + hiccups, poor appetite.   Objective:   BP (!) 151/82 (BP Location: Left Arm)   Pulse 76   Temp 97.9 F (36.6 C) (Oral)   Resp 20   Ht 5\' 8"  (1.727 m)   Wt 131.5 kg   SpO2 93%   BMI 44.09 kg/m   Intake/Output Summary (Last 24 hours) at 08/11/2019 1154 Last data filed at 08/11/2019 0933 Gross per 24 hour  Intake 585.26 ml  Output 700 ml  Net -114.74 ml   Weight  change:   Physical Exam: GEN: NAD, A&Ox3, NCAT HEENT: No conjunctival pallor, EOMI NECK: Supple, no thyromegaly LUNGS: distant BS CV: RRR, No M/R/G ABD: SNDNT +BS  EXT: Tr lower extremity edema  Imaging: US RENAL  Result Date: 08/09/2019 CLINICAL DATA:  Acute renal failure. Chronic kidney disease. Diabetes. COVID-19 positive. Hypertension. EXAM: RENAL / URINARY TRACT ULTRASOUND COMPLETE COMPARISON:  05/13/2016. FINDINGS: Right Kidney: Renal measurements: 11.7 x 6.7 x 6.0 cm = volume: 244 mL . Echogenicity within normal limits. No mass or hydronephrosis visualized. Left Kidney: Renal measurements: 12.7 x 6.2 x 6.0 cm = volume: 248 mL. Echogenicity within normal limits. Stable prominent column of Bertin. No mass or hydronephrosis visualized. Bladder: Appears normal for degree of bladder distention. Other: None. IMPRESSION: Normal examination, unchanged. Electronically Signed   By: Claudie Revering M.D.   On: 08/09/2019 19:38    Labs: BMET Recent Labs  Lab 08/08/19 1436 08/09/19 0400 08/09/19 1849 08/10/19 0258 08/11/19 0240  NA 135 132* 134* 134* 133*  K 5.1 6.5* 5.5* 5.3* 4.6  CL 106 104 105 106 105  CO2 20* 17* 17* 15* 15*  GLUCOSE 275* 406* 344* 252* 216*  BUN 58* 71* 82* 91* 117*  CREATININE 5.11* 5.74* 6.07* 6.33* 6.68*  CALCIUM 8.3* 8.0* 8.5* 8.5* 8.3*  CBC Recent Labs  Lab 08/08/19 1436 08/09/19 0400 08/10/19 0258 08/11/19 0240  WBC 6.2 3.5* 5.5 8.3  NEUTROABS 4.2 2.9 4.3 7.1  HGB 11.3* 10.3* 10.3* 10.4*  HCT 36.1* 34.2* 33.0* 32.9*  MCV 79.5* 80.5 79.5* 77.8*  PLT 201 187 184 206    Medications:    . albuterol  2 puff Inhalation TID  . amLODipine  10 mg Oral Daily  . vitamin C  500 mg Oral Daily  . aspirin EC  81 mg Oral Daily  . atorvastatin  40 mg Oral Daily  . carvedilol  6.25 mg Oral BID WC  . dexamethasone (DECADRON) injection  6 mg Intravenous Q24H  . heparin  7,500 Units Subcutaneous Q8H  . hydrALAZINE  25 mg Oral TID  . insulin aspart  0-15  Units Subcutaneous TID WC  . insulin aspart  0-5 Units Subcutaneous QHS  . insulin aspart  6 Units Subcutaneous TID WC  . insulin detemir  12 Units Subcutaneous BID  . levETIRAcetam  1,000 mg Oral Daily  . sodium zirconium cyclosilicate  10 g Oral BID  . zinc sulfate  220 mg Oral Daily      Otelia Santee, MD 08/11/2019, 11:54 AM

## 2019-08-12 ENCOUNTER — Inpatient Hospital Stay (HOSPITAL_COMMUNITY): Payer: BC Managed Care – PPO

## 2019-08-12 DIAGNOSIS — Z5111 Encounter for antineoplastic chemotherapy: Secondary | ICD-10-CM | POA: Diagnosis not present

## 2019-08-12 DIAGNOSIS — I129 Hypertensive chronic kidney disease with stage 1 through stage 4 chronic kidney disease, or unspecified chronic kidney disease: Secondary | ICD-10-CM | POA: Diagnosis not present

## 2019-08-12 DIAGNOSIS — J9600 Acute respiratory failure, unspecified whether with hypoxia or hypercapnia: Secondary | ICD-10-CM | POA: Diagnosis not present

## 2019-08-12 DIAGNOSIS — N19 Unspecified kidney failure: Secondary | ICD-10-CM | POA: Diagnosis not present

## 2019-08-12 DIAGNOSIS — N179 Acute kidney failure, unspecified: Secondary | ICD-10-CM | POA: Diagnosis not present

## 2019-08-12 DIAGNOSIS — Z4901 Encounter for fitting and adjustment of extracorporeal dialysis catheter: Secondary | ICD-10-CM | POA: Diagnosis not present

## 2019-08-12 DIAGNOSIS — U071 COVID-19: Secondary | ICD-10-CM | POA: Diagnosis not present

## 2019-08-12 DIAGNOSIS — N184 Chronic kidney disease, stage 4 (severe): Secondary | ICD-10-CM | POA: Diagnosis not present

## 2019-08-12 HISTORY — PX: IR US GUIDE VASC ACCESS RIGHT: IMG2390

## 2019-08-12 HISTORY — PX: IR FLUORO GUIDE CV LINE RIGHT: IMG2283

## 2019-08-12 LAB — GLUCOSE, CAPILLARY
Glucose-Capillary: 240 mg/dL — ABNORMAL HIGH (ref 70–99)
Glucose-Capillary: 246 mg/dL — ABNORMAL HIGH (ref 70–99)
Glucose-Capillary: 240 mg/dL — ABNORMAL HIGH (ref 70–99)
Glucose-Capillary: 227 mg/dL — ABNORMAL HIGH (ref 70–99)

## 2019-08-12 LAB — BASIC METABOLIC PANEL
Anion gap: 16 — ABNORMAL HIGH (ref 5–15)
BUN: 128 mg/dL — ABNORMAL HIGH (ref 6–20)
CO2: 16 mmol/L — ABNORMAL LOW (ref 22–32)
Calcium: 8.2 mg/dL — ABNORMAL LOW (ref 8.9–10.3)
Chloride: 105 mmol/L (ref 98–111)
Creatinine, Ser: 6.64 mg/dL — ABNORMAL HIGH (ref 0.61–1.24)
GFR calc Af Amer: 10 mL/min — ABNORMAL LOW (ref >60)
GFR calc non Af Amer: 9 mL/min — ABNORMAL LOW (ref >60)
Glucose, Bld: 248 mg/dL — ABNORMAL HIGH (ref 70–99)
Potassium: 4.5 mmol/L (ref 3.5–5.1)
Sodium: 137 mmol/L (ref 135–145)

## 2019-08-12 MED ORDER — INSULIN DETEMIR 100 UNIT/ML ~~LOC~~ SOLN
18.0000 [IU] | Freq: Two times a day (BID) | SUBCUTANEOUS | Status: DC
  Administered 2019-08-12: 18 [IU] via SUBCUTANEOUS
  Filled 2019-08-12 (×3): qty 0.18

## 2019-08-12 MED ORDER — HEPARIN SODIUM (PORCINE) 1000 UNIT/ML IJ SOLN
INTRAMUSCULAR | Status: AC
  Administered 2019-08-12: 3.2 mL
  Filled 2019-08-12: qty 1

## 2019-08-12 MED ORDER — LIDOCAINE HCL 1 % IJ SOLN
INTRAMUSCULAR | Status: AC
  Filled 2019-08-12: qty 20

## 2019-08-12 MED ORDER — FENTANYL CITRATE (PF) 100 MCG/2ML IJ SOLN
INTRAMUSCULAR | Status: AC
  Filled 2019-08-12: qty 2

## 2019-08-12 MED ORDER — FENTANYL CITRATE (PF) 100 MCG/2ML IJ SOLN
INTRAMUSCULAR | Status: AC | PRN
  Administered 2019-08-12: 25 ug via INTRAVENOUS

## 2019-08-12 MED ORDER — CEFAZOLIN SODIUM-DEXTROSE 2-4 GM/100ML-% IV SOLN
INTRAVENOUS | Status: AC
  Administered 2019-08-12: 3 g
  Filled 2019-08-12: qty 100

## 2019-08-12 MED ORDER — LIDOCAINE HCL (PF) 1 % IJ SOLN
INTRAMUSCULAR | Status: AC | PRN
  Administered 2019-08-12: 10 mL

## 2019-08-12 MED ORDER — GELATIN ABSORBABLE 12-7 MM EX MISC
CUTANEOUS | Status: AC
  Filled 2019-08-12: qty 1

## 2019-08-12 MED ORDER — MIDAZOLAM HCL 2 MG/2ML IJ SOLN
INTRAMUSCULAR | Status: AC | PRN
  Administered 2019-08-12: 0.5 mg via INTRAVENOUS

## 2019-08-12 MED ORDER — CHLORHEXIDINE GLUCONATE CLOTH 2 % EX PADS
6.0000 | MEDICATED_PAD | Freq: Every day | CUTANEOUS | Status: DC
  Administered 2019-08-12 – 2019-08-13 (×2): 6 via TOPICAL

## 2019-08-12 MED ORDER — DEXTROSE 5 % IV SOLN
3.0000 g | INTRAVENOUS | Status: AC
  Filled 2019-08-12 (×2): qty 3000

## 2019-08-12 MED ORDER — MIDAZOLAM HCL 2 MG/2ML IJ SOLN
INTRAMUSCULAR | Status: AC
  Filled 2019-08-12: qty 2

## 2019-08-12 NOTE — Progress Notes (Signed)
[facility] Progress Note    54 y.o.maleDM HTN HLD OSA Sz CKD4 with a baseline creatininein the 3.3-3.7range in early to mid 2020 presenting with worsening shortness of breath over the past few days prior to admission. By history the girlfriend has Covid and in the ED the patient also tested COVID+. +NSAIDs as well  Assessment/ Plan:   1. AKI on CKDIV with a baseline that was already in the 3.3-3.7 range in early to mid 2020. He stopped going to see his nephrologist with [facility] bec he is a man of faithand also [place] hit so he missed an appt and never rescheduled (new job). If absolutely necessary he's willing to undergo dialysis but even now he's believing that it won't be necessary.I spent a long time again today discussing the possibility of dialysis but he was having a hard time accepting that.His creatinine was 3.5 in 07/2018 at [place]. +NSAID + continued Lasix at home. -Renal ultrasounddoes not showObstruction. - Will stop the [facility].  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   - Appreciate VIR placing TC; will be late today so will  plan 1st  RRT Fri AM.  - High possibility he won't recover function to come off RRT as he had very advanced disease to begin with; will evaluate daily and continue educating the pt as well.  2. COVID+ - per primary team 3. DM 4. HTN 5. H/o sz  Subjective:   Some dyspnea when walking to restroom; he attributes it to getting excited.  Hiccups better today. Denies f/myalgias/cp/ nausea   Objective:   BP (!) 159/83 (BP Location: Left Arm)   Pulse 85   Temp 98.5 F (36.9 C) (Oral)   Resp 20   Ht 5\' 8"  (1.727 m)   Wt 131.5 kg   SpO2 90%   BMI 44.09 kg/m   Intake/Output Summary (Last 24 hours) at 08/12/2019 0731 Last data filed at 08/11/2019 1348 Gross per 24 hour  Intake 100 ml  Output 400 ml  Net -300 ml    Weight change:   Physical Exam: GEN: NAD, A&Ox3, NCAT HEENT: No conjunctival pallor, EOMI NECK: Supple, no thyromegaly LUNGS:distant BS CV: RRR, No M/R/G ABD: SNDNT +BS  HFW:YOVZCHY extremity edema  Imaging: No results found.  Labs: BMET Recent Labs  Lab 08/08/19 1436 08/09/19 0400 08/09/19 1849 08/10/19 0258 08/11/19 0240  NA 135 132* 134* 134* 133*  K 5.1 6.5* 5.5* 5.3* 4.6  CL 106 104 105 106 105  CO2 20* 17* 17* 15* 15*  GLUCOSE 275* 406* 344* 252* 216*  BUN 58* 71* 82* 91* 117*  CREATININE 5.11* 5.74* 6.07* 6.33* 6.68*  CALCIUM 8.3* 8.0* 8.5* 8.5* 8.3*   CBC Recent Labs  Lab 08/08/19 1436 08/09/19 0400 08/10/19 0258 08/11/19 0240  WBC 6.2 3.5* 5.5 8.3  NEUTROABS 4.2 2.9 4.3 7.1  HGB 11.3* 10.3* 10.3* 10.4*  HCT 36.1* 34.2* 33.0* 32.9*  MCV 79.5* 80.5 79.5* 77.8*  PLT 201 187 184 206    Medications:    . albuterol  2 puff Inhalation TID  . amLODipine  10 mg Oral Daily  . vitamin C  500 mg Oral Daily  . aspirin EC  81 mg Oral Daily  . atorvastatin  40 mg Oral Daily  . carvedilol  6.25 mg Oral BID WC  . dexamethasone (DECADRON) injection  6 mg Intravenous Q24H  .  hydrALAZINE  25 mg Oral TID  . insulin aspart  0-15 Units Subcutaneous TID WC  . insulin aspart  0-5 Units Subcutaneous QHS  . insulin aspart  6 Units Subcutaneous TID WC  . insulin detemir  16 Units Subcutaneous BID  . levETIRAcetam  1,000 mg Oral Daily  . pantoprazole  40 mg Oral Daily  . simethicone  80 mg Oral QID  . sodium zirconium cyclosilicate  10 g Oral BID  . zinc sulfate  220 mg Oral Daily      Otelia Santee, MD 08/12/2019, 7:31 AM

## 2019-08-12 NOTE — Procedures (Signed)
Interventional Radiology Procedure Note  Procedure: Image guided right IJ tunneled HD catheter.  Complications: None Recommendations:  - Ok to use - Do not submerge - Routine line care   Signed,  Dulcy Fanny. Earleen Newport, DO

## 2019-08-12 NOTE — Progress Notes (Signed)
Daughter called and updated. 

## 2019-08-12 NOTE — Progress Notes (Addendum)
PROGRESS NOTE    Jeremy [name]   QMV:[id number]  DOB: 1966-01-17  DOA: 08/08/2019 PCP: Danna Hefty, DO   PATIENT BELONGS TO [facility] They have graciously agreed to take over care in the AM of 1/22   Brief Narrative:  Jeremy [name] 54 y.o.maleDM HTN HLD OSA Sz CKD4 with a baseline creatininein the 3.3-3.7range in early to mid 2020 presenting with worsening shortness of breath over the past few days prior to admission. By history the girlfriend has Covid and in the ED the patient also tested COVID+.     Subjective: Denies fever/chills-- "I just want to get this done"    Assessment & Plan:      Acute respiratory failure due to COVID 19 pneumonia -Chest x-ray on 1/17 showed bilateral airspace opacities - Continue Remdesivir and Decadron which were started on 1/18 and should continue for 5 days/10 days respectively  - He remains hypoxic and requires about 5-6 L of O2 at this time- wean as able - cont Vit C, Zinc, Albuterol    AKI on Chronic kidney disease (CKD), stage IV with acute metabolic acidosis and hyperkalemia -Occurring in the setting of Lasix and enalapril use, underlying and hypertensive diabetic nephropathy -Baseline creatinine is between 3 and 3.7 - Unfortunately, his Cr has been steadily rising from 5.1 and now is 6.68 -Renal ultrasound is unrevealing -nephrology consult appreciated:   Appreciate VIR placing TC; will be late today so will  plan 1st  RRT Fri AM. High possibility he won't recover function to come off RRT as he had very advanced disease to begin with; will evaluate daily and continue educating the pt as well.  Essential hypertension -Continue amlodipine, hydralazine and carvedilol -Enalapril and furosemide remain on hold  Seizure disorder -Continue Keppra    Uncontrolled type 2 diabetes mellitus with diabetic nephropathy, with long-term current use of insulin (HCC) - Last A1c 9.0 on 08/08/19 -on steroids so will titrate up insulin dose     OSA (obstructive sleep apnea) -CPAP  morbid obesity -Body mass index is 44.09 kg/m.    Time spent in minutes: 35 DVT prophylaxis: Heparin Code Status: Full code Disposition Plan: pending HD Consultants:   Nephrology   Antimicrobials:  Anti-infectives (From admission, onward)   Start     Dose/Rate Route Frequency Ordered Stop   08/12/19 0600  ceFAZolin (ANCEF) 3 g in dextrose 5 % 50 mL IVPB     3 g 100 mL/hr over 30 Minutes Intravenous On call 08/11/19 1555 08/13/19 0600   08/10/19 1000  remdesivir 100 mg in sodium chloride 0.9 % 100 mL IVPB     100 mg 200 mL/hr over 30 Minutes Intravenous Daily 08/09/19 0554 08/14/19 0959   08/09/19 0600  remdesivir 200 mg in sodium chloride 0.9% 250 mL IVPB     200 mg 580 mL/hr over 30 Minutes Intravenous Once 08/09/19 0554 08/09/19 0941       Objective: Vitals:   08/11/19 2112 08/11/19 2114 08/12/19 0315 08/12/19 0826  BP: (!) 149/85  (!) 159/83 (!) 158/73  Pulse: (!) 41 78 85 84  Resp: 20   20  Temp: 98.7 F (37.1 C)  98.5 F (36.9 C) 98.6 F (37 C)  TempSrc: Oral  Oral   SpO2: 94% 94% 90% 94%  Weight:      Height:        Intake/Output Summary (Last 24 hours) at 08/12/2019 1335 Last data filed at 08/12/2019 0800 Gross per 24 hour  Intake 240 ml  Output 400 ml  Net -160 ml   Filed Weights   08/08/19 1322 08/08/19 1510  Weight: 132.9 kg 131.5 kg    Examination: In bed, NAD rrr No increased work of breathing Obese abdomen A+Ox3 Poor sight into disease process  Data Reviewed: I have personally reviewed following labs and imaging studies  CBC: Recent Labs  Lab 08/08/19 1436 08/09/19 0400 08/10/19 0258 08/11/19 0240  WBC 6.2 3.5* 5.5 8.3  NEUTROABS 4.2 2.9 4.3 7.1  HGB 11.3* 10.3* 10.3* 10.4*  HCT 36.1* 34.2* 33.0* 32.9*  MCV 79.5* 80.5 79.5* 77.8*  PLT 201 187 184 329   Basic Metabolic Panel: Recent Labs  Lab 08/09/19 0400 08/09/19 1849 08/10/19 0258 08/11/19 0240 08/12/19 0809  NA 132* 134*  134* 133* 137  K 6.5* 5.5* 5.3* 4.6 4.5  CL 104 105 106 105 105  CO2 17* 17* 15* 15* 16*  GLUCOSE 406* 344* 252* 216* 248*  BUN 71* 82* 91* 117* 128*  CREATININE 5.74* 6.07* 6.33* 6.68* 6.64*  CALCIUM 8.0* 8.5* 8.5* 8.3* 8.2*   GFR: Estimated Creatinine Clearance: 17.2 mL/min (A) (by C-G formula based on SCr of 6.64 mg/dL (H)). Liver Function Tests: Recent Labs  Lab 08/09/19 0400 08/10/19 0258 08/11/19 0240  AST 20 20 19   ALT 19 17 16   ALKPHOS 68 61 59  BILITOT 0.3 0.4 0.5  PROT 7.5 7.1 7.1  ALBUMIN 3.1* 2.8* 3.0*   No results for input(s): LIPASE, AMYLASE in the last 168 hours. No results for input(s): AMMONIA in the last 168 hours. Coagulation Profile: No results for input(s): INR, PROTIME in the last 168 hours. Cardiac Enzymes: No results for input(s): CKTOTAL, CKMB, CKMBINDEX, TROPONINI in the last 168 hours. BNP (last 3 results) No results for input(s): PROBNP in the last 8760 hours. HbA1C: No results for input(s): HGBA1C in the last 72 hours. CBG: Recent Labs  Lab 08/11/19 1109 08/11/19 1639 08/11/19 2107 08/12/19 0822 08/12/19 1139  GLUCAP 227* 192* 208* 240* 246*   Lipid Profile: No results for input(s): CHOL, HDL, LDLCALC, TRIG, CHOLHDL, LDLDIRECT in the last 72 hours. Thyroid Function Tests: No results for input(s): TSH, T4TOTAL, FREET4, T3FREE, THYROIDAB in the last 72 hours. Anemia Panel: Recent Labs    08/10/19 0258 08/11/19 0240  FERRITIN 661* 556*   Urine analysis:    Component Value Date/Time   COLORURINE YELLOW 08/09/2017 2141   APPEARANCEUR HAZY (A) 08/09/2017 2141   LABSPEC 1.017 08/09/2017 2141   PHURINE 5.0 08/09/2017 2141   GLUCOSEU 150 (A) 08/09/2017 2141   HGBUR MODERATE (A) 08/09/2017 2141   BILIRUBINUR negative 09/24/2018 0000   BILIRUBINUR NEGATIVE 11/26/2013 1147   KETONESUR negative 09/24/2018 0000   KETONESUR NEGATIVE 08/09/2017 2141   PROTEINUR negative 09/24/2018 0000   PROTEINUR 100 (A) 08/09/2017 2141    UROBILINOGEN 0.2 09/24/2018 0000   UROBILINOGEN 0.2 07/31/2012 1557   NITRITE Negative 09/24/2018 0000   NITRITE NEGATIVE 08/09/2017 2141   LEUKOCYTESUR Negative 09/24/2018 0000    Recent Results (from the past 240 hour(s))  Respiratory Panel by RT PCR (Flu A&B, Covid) - Nasopharyngeal Swab     Status: Abnormal   Collection Time: 08/08/19  3:36 PM   Specimen: Nasopharyngeal Swab  Result Value Ref Range Status   SARS Coronavirus 2 by RT PCR POSITIVE (A) NEGATIVE Final    Comment: RESULT CALLED TO, READ BACK BY AND VERIFIED WITH: Marcy Panning RN 1911 08/08/19 JM (NOTE) SARS-CoV-2 target nucleic acids are DETECTED. SARS-CoV-2 RNA is generally detectable  in upper respiratory specimens  during the acute phase of infection. Positive results are indicative of the presence of the identified virus, but do not rule out bacterial infection or co-infection with other pathogens not detected by the test. Clinical correlation with patient history and other diagnostic information is necessary to determine patient infection status. The expected result is Negative. Fact Sheet for Patients:  PinkCheek.be Fact Sheet for Healthcare Providers: GravelBags.it This test is not yet approved or cleared by the [place] FDA and  has been authorized for detection and/or diagnosis of SARS-CoV-2 by FDA under an Emergency Use Authorization (EUA).  This EUA will remain in effect (meaning this test can be used) for th e duration of  the COVID-19 declaration under Section 564(b)(1) of the Act, 21 U.S.C. section 360bbb-3(b)(1), unless the authorization is terminated or revoked sooner.    Influenza A by PCR NEGATIVE NEGATIVE Final   Influenza B by PCR NEGATIVE NEGATIVE Final    Comment: (NOTE) The Xpert Xpress SARS-CoV-2/FLU/RSV assay is intended as an aid in  the diagnosis of influenza from Nasopharyngeal swab specimens and  should not be used as a sole  basis for treatment. Nasal washings and  aspirates are unacceptable for Xpert Xpress SARS-CoV-2/FLU/RSV  testing. Fact Sheet for Patients: PinkCheek.be Fact Sheet for Healthcare Providers: GravelBags.it This test is not yet approved or cleared by the [place] FDA and  has been authorized for detection and/or diagnosis of SARS-CoV-2 by  FDA under an Emergency Use Authorization (EUA). This EUA will remain  in effect (meaning this test can be used) for the duration of the  Covid-19 declaration under Section 564(b)(1) of the Act, 21  U.S.C. section 360bbb-3(b)(1), unless the authorization is  terminated or revoked. Performed at [facility], [place] [address] [place], [place] [address]          Radiology Studies: No results found.    Scheduled Meds: . albuterol  2 puff Inhalation TID  . amLODipine  10 mg Oral Daily  . vitamin C  500 mg Oral Daily  . aspirin EC  81 mg Oral Daily  . atorvastatin  40 mg Oral Daily  . carvedilol  6.25 mg Oral BID WC  . Chlorhexidine Gluconate Cloth  6 each Topical Q0600  . dexamethasone (DECADRON) injection  6 mg Intravenous Q24H  . hydrALAZINE  25 mg Oral TID  . insulin aspart  0-15 Units Subcutaneous TID WC  . insulin aspart  0-5 Units Subcutaneous QHS  . insulin aspart  6 Units Subcutaneous TID WC  . insulin detemir  18 Units Subcutaneous BID  . levETIRAcetam  1,000 mg Oral Daily  . pantoprazole  40 mg Oral Daily  . simethicone  80 mg Oral QID  . sodium zirconium cyclosilicate  10 g Oral BID  . zinc sulfate  220 mg Oral Daily   Continuous Infusions: .  ceFAZolin (ANCEF) IV    . remdesivir 100 mg in NS 100 mL 100 mg (08/12/19 0928)     LOS: 4 days      Geradine Girt, DO [facility]  08/12/2019, 1:35 PM

## 2019-08-12 NOTE — H&P (Signed)
Patient Status: [facility] - In-pt  Assessment and Plan:  54 y/o M with AKI on CKD in need of venous access to initiate hemodialysis - will plan for tunneled HD catheter placement this afternoon in IR. Patient planned to be last case of day due to Covid (+) status still on airborne + contact precautions.  Patient to remain NPO until post procedure - he tells me had 1/2 a cup of water earlier this morning but he has not had any food today, continue to hold SQ heparin, IR will call for patient when ready.   Risks and benefits discussed with the patient including, but not limited to bleeding, infection, vascular injury, pneumothorax which may require chest tube placement, air embolism or even death.  All of the patient's questions were answered, patient is agreeable to proceed.  Consent signed and in IR control room. ______________________________________________________________________   History of Present Illness: Jeremy [name] is a 54 y.o. male with past medical history significant for DM, HTN, Covid (+) and AKI on CKD now requiring initiation of HD. IR has been asked to place a tunneled HD catheter for HD access.  Patient seen in his room, full PPE used during exam - patient reports that he is not looking forward to starting HD and believes that he doesn't really need it, however everyone is telling him he does and he would like to get better so he can go home so he is agreeable to proceed. He is hopeful that he will not need dialysis for long. He also states numerous times that he cannot take being in the hospital much longer and if he is not able to leave tomorrow he will be very upset, he knows everyone is trying to help him get better but he is frustrated that he is only able to basically lay in bed all day. 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 After thorough discussion of the procedure as well as risks  patient is agreeable to proceed and gives verbal consent today.  Allergies and medications reviewed.   Review of Systems: A 12 point ROS discussed and pertinent positives are indicated in the HPI above.  All other systems are negative.  Review of Systems  Constitutional: Positive for fatigue. Negative for chills and fever.  Respiratory: Positive for cough and shortness of breath ("because I'm stuck in bed all the time").   Cardiovascular: Positive for leg swelling. Negative for chest pain.  Gastrointestinal: Negative for abdominal pain, blood in stool, diarrhea, nausea and vomiting.  Neurological: Negative for dizziness and headaches.    Vital Signs: BP (!) 158/73   Pulse 84   Temp 98.6 F (37 C)   Resp 20   Ht 5\' 8"  (1.727 m)   Wt 290 lb (131.5 kg)   SpO2 94%   BMI 44.09 kg/m   Physical Exam Vitals and nursing note reviewed.  Constitutional:      General: He is not in acute distress. HENT:     Head: Normocephalic.     Mouth/Throat:     Mouth: Mucous membranes are moist.     Pharynx: Oropharynx is clear. No oropharyngeal exudate or posterior oropharyngeal erythema.  Cardiovascular:     Rate and Rhythm: Normal rate and regular rhythm.  Pulmonary:     Effort: Pulmonary effort is normal.     Breath sounds: Normal breath sounds.  Abdominal:  General: There is no distension.     Palpations: Abdomen is soft.     Tenderness: There is no abdominal tenderness.  Musculoskeletal:     Right lower leg: Edema present.     Left lower leg: Edema present.  Skin:    General: Skin is warm and dry.  Neurological:     Mental Status: He is alert and oriented to person, place, and time.  Psychiatric:        Mood and Affect: Mood normal.        Behavior: Behavior normal.        Thought Content: Thought content normal.        Judgment: Judgment normal.      Imaging reviewed.   Labs:  COAGS: No results for input(s): INR, APTT in the last 8760 hours.  BMP: Recent Labs     08/09/19 1849 08/10/19 0258 08/11/19 0240 08/12/19 0809  NA 134* 134* 133* 137  K 5.5* 5.3* 4.6 4.5  CL 105 106 105 105  CO2 17* 15* 15* 16*  GLUCOSE 344* 252* 216* 248*  BUN 82* 91* 117* 128*  CALCIUM 8.5* 8.5* 8.3* 8.2*  CREATININE 6.07* 6.33* 6.68* 6.64*  GFRNONAA 10* 9* 9* 9*  GFRAA 11* 11* 10* 10*       Electronically Signed: Joaquim Nam, PA-C 08/12/2019, 10:27 AM   I spent a total of 15 minutes in face to face in clinical consultation, greater than 50% of which was counseling/coordinating care for venous access.

## 2019-08-12 NOTE — Progress Notes (Signed)
Inpatient Diabetes Program Recommendations  AACE/ADA: New Consensus Statement on Inpatient Glycemic Control (2015)  Target Ranges:  Prepandial:   less than 140 mg/dL      Peak postprandial:   less than 180 mg/dL (1-2 hours)      Critically ill patients:  140 - 180 mg/dL   Lab Results  Component Value Date   GLUCAP 240 (H) 08/12/2019   HGBA1C 9.0 (H) 08/08/2019    Review of Glycemic Control Results for Jeremy [name], Jeremy [name] (MRN [id number]) as of 08/12/2019 10:43  Ref. Range 08/11/2019 11:09 08/11/2019 16:39 08/11/2019 21:07 08/12/2019 08:22  Glucose-Capillary Latest Ref Range: 70 - 99 mg/dL 227 (H) 192 (H) 208 (H) 240 (H)   Diabetes history: Type 2 DM Outpatient Diabetes medications: Humalog 25 units Q week, Victoza 1.2 mg QD Current orders for Inpatient glycemic control: Levemir 16 units BID, Novolog 6 units TID, Novolog 0-15 units TID, Novolog 0-5 units qHS Decadron 6 mg QD  Inpatient Diabetes Program Recommendations:    Consider increasing Levemir to 18 units BID.  Noted patient is taking Humalog Q week? This has been verified with patient multiple times. Will try to reach out to patient regarding this and provide education on the action of the drug.   Thanks, Bronson Curb, MSN, RNC-OB Diabetes Coordinator [phone] (8a-5p)

## 2019-08-12 NOTE — Progress Notes (Signed)
Patient tranfers to [facility] via [facility] Link at this time in no acute episode.

## 2019-08-13 LAB — CBC WITH DIFFERENTIAL/PLATELET
Abs Immature Granulocytes: 0.22 10*3/uL — ABNORMAL HIGH (ref 0.00–0.07)
Basophils Absolute: 0 10*3/uL (ref 0.0–0.1)
Basophils Relative: 0 %
Eosinophils Absolute: 0 10*3/uL (ref 0.0–0.5)
Eosinophils Relative: 0 %
HCT: 37.4 % — ABNORMAL LOW (ref 39.0–52.0)
Hemoglobin: 12.1 g/dL — ABNORMAL LOW (ref 13.0–17.0)
Immature Granulocytes: 2 %
Lymphocytes Relative: 5 %
Lymphs Abs: 0.6 10*3/uL — ABNORMAL LOW (ref 0.7–4.0)
MCH: 24.9 pg — ABNORMAL LOW (ref 26.0–34.0)
MCHC: 32.4 g/dL (ref 30.0–36.0)
MCV: 77 fL — ABNORMAL LOW (ref 80.0–100.0)
Monocytes Absolute: 1.8 10*3/uL — ABNORMAL HIGH (ref 0.1–1.0)
Monocytes Relative: 17 %
Neutro Abs: 8.1 10*3/uL — ABNORMAL HIGH (ref 1.7–7.7)
Neutrophils Relative %: 76 %
Platelets: 289 10*3/uL (ref 150–400)
RBC: 4.86 MIL/uL (ref 4.22–5.81)
RDW: 14.9 % (ref 11.5–15.5)
WBC: 10.7 10*3/uL — ABNORMAL HIGH (ref 4.0–10.5)
nRBC: 0 % (ref 0.0–0.2)

## 2019-08-13 LAB — COMPREHENSIVE METABOLIC PANEL
ALT: 13 U/L (ref 0–44)
AST: 17 U/L (ref 15–41)
Albumin: 3 g/dL — ABNORMAL LOW (ref 3.5–5.0)
Alkaline Phosphatase: 70 U/L (ref 38–126)
Anion gap: 17 — ABNORMAL HIGH (ref 5–15)
BUN: 148 mg/dL — ABNORMAL HIGH (ref 6–20)
CO2: 13 mmol/L — ABNORMAL LOW (ref 22–32)
Calcium: 8 mg/dL — ABNORMAL LOW (ref 8.9–10.3)
Chloride: 100 mmol/L (ref 98–111)
Creatinine, Ser: 7.48 mg/dL — ABNORMAL HIGH (ref 0.61–1.24)
GFR calc Af Amer: 9 mL/min — ABNORMAL LOW (ref >60)
GFR calc non Af Amer: 8 mL/min — ABNORMAL LOW (ref >60)
Glucose, Bld: 239 mg/dL — ABNORMAL HIGH (ref 70–99)
Potassium: 4.4 mmol/L (ref 3.5–5.1)
Sodium: 130 mmol/L — ABNORMAL LOW (ref 135–145)
Total Bilirubin: 0.3 mg/dL (ref 0.3–1.2)
Total Protein: 7.1 g/dL (ref 6.5–8.1)

## 2019-08-13 LAB — HEPATITIS B SURFACE ANTIGEN: Hepatitis B Surface Ag: NONREACTIVE

## 2019-08-13 LAB — D-DIMER, QUANTITATIVE: D-Dimer, Quant: 2.29 ug/mL-FEU — ABNORMAL HIGH (ref 0.00–0.50)

## 2019-08-13 LAB — FERRITIN: Ferritin: 521 ng/mL — ABNORMAL HIGH (ref 24–336)

## 2019-08-13 LAB — PHOSPHORUS: Phosphorus: 5.8 mg/dL — ABNORMAL HIGH (ref 2.5–4.6)

## 2019-08-13 LAB — C-REACTIVE PROTEIN: CRP: 1.7 mg/dL — ABNORMAL HIGH (ref <1.0)

## 2019-08-13 LAB — GLUCOSE, CAPILLARY: Glucose-Capillary: 256 mg/dL — ABNORMAL HIGH (ref 70–99)

## 2019-08-13 MED ORDER — HEPARIN SODIUM (PORCINE) 5000 UNIT/ML IJ SOLN
5000.0000 [IU] | Freq: Three times a day (TID) | INTRAMUSCULAR | Status: DC
  Administered 2019-08-13: 5000 [IU] via SUBCUTANEOUS
  Filled 2019-08-13: qty 1

## 2019-08-13 MED ORDER — INSULIN DETEMIR 100 UNIT/ML ~~LOC~~ SOLN
20.0000 [IU] | Freq: Two times a day (BID) | SUBCUTANEOUS | Status: DC
  Filled 2019-08-13: qty 0.2

## 2019-08-13 MED ORDER — DEXAMETHASONE 4 MG PO TABS: 6.0000 mg | ORAL_TABLET | Freq: Every day | ORAL | Status: DC

## 2019-08-13 MED ORDER — LEVETIRACETAM 500 MG PO TABS: 1000.0000 mg | ORAL_TABLET | Freq: Every day | ORAL | Status: DC

## 2019-08-13 MED ORDER — HEPARIN SODIUM (PORCINE) 1000 UNIT/ML IJ SOLN
INTRAMUSCULAR | Status: AC
  Filled 2019-08-13: qty 4

## 2019-08-13 NOTE — Discharge Summary (Signed)
[facility] AMA summary  Patient name: Jeremy [name] Medical record number: [id number] Date of birth: September 09, 1965 Age: 54 y.o. Gender: male Date of Admission: 08/08/2019  Date of Discharge: Left AMA 08/13/2019 Admitting Physician: Mendel Corning, MD  Primary Care Provider: Danna Hefty, DO Consultants: Nephrology, IR  Indication for Hospitalization: Increase shortness of breath  Discharge Diagnoses/Problem List:  Principal Problem:   Acute respiratory failure [facility]) Active Problems:   Morbid obesity ([place])   Chronic kidney disease (CKD), stage IV (severe) ([place])   Uncontrolled type 2 diabetes mellitus with diabetic nephropathy, with long-term current use of insulin (HCC)   OSA (obstructive sleep apnea)   Acute respiratory failure due to COVID-19 [facility])  Disposition: Left AMA  Discharge Condition: Stable  Discharge Exam:  General: Alert and oriented in no apparent distress Heart: Regular rate and rhythm with no murmurs appreciated Lungs: CTA, no respiratory distress on room air Abdomen: Bowel sounds present, no abdominal pain  Brief Hospital Course:  Acute respiratory failure secondary to COVID-19  patient noted for increased shortness of breath, found to be COVID-19 positive after patient's girlfriend had apparently been diagnosed with Covid.  Patient initially had oxygen requirement of 3 L which increased at times to 5 L.  Patient was started on remdesivir for 5 days as well as dexamethasone for 10-day total course.  Patient completed the remdesivir on 1/22 and at that point had resumed to room air satting well.  Patient left AMA on 1/22.  AKI on CKD Patient followed by nephrology on outpatient setting and apparently had had discussions in the past about the likelihood of needing to initiate dialysis.  Patient's baseline creatinine around 3-3.7 with increases over the hospitalization to 6.64.  Patient was transferred to [facility] for initiation of  dialysis during his hospitalization on 1/21.  A tunneled catheter was placed by interventional radiology on 1/21 and the patient was started on RRT on 1/22 with plan to initiate HD on 1/23.  Unfortunately patient left AMA prior to receiving his HD on 1/23.  Type 2 diabetes Patient's Levemir was increased over the duration of his hospitalization, particularly while he was on dexamethasone.  He was on 18 units Levemir twice per day upon transfer into teaching service, however patient left AMA.  Patient also continued with 6 units of NovoLog mealtime coverage 3 times per day.  Issues for Follow Up:  1. Follow-up with nephrology 2. PCP additional follow-up on patient diabetes  Significant Procedures: IR tunneled cath placed on 1/21  Significant Labs and Imaging:  Recent Labs  Lab 08/10/19 0258 08/11/19 0240 08/13/19 0648  WBC 5.5 8.3 10.7*  HGB 10.3* 10.4* 12.1*  HCT 33.0* 32.9* 37.4*  PLT 184 206 289   Recent Labs  Lab 08/09/19 0400 08/09/19 0400 08/09/19 1849 08/09/19 1849 08/10/19 0258 08/10/19 0258 08/11/19 0240 08/11/19 0240 08/12/19 0809 08/13/19 0648 08/13/19 0845  NA 132*   < > 134*  --  134*  --  133*  --  137 130*  --   K 6.5*   < > 5.5*   < > 5.3*   < > 4.6   < > 4.5 4.4  --   CL 104   < > 105  --  106  --  105  --  105 100  --   CO2 17*   < > 17*  --  15*  --  15*  --  16* 13*  --   GLUCOSE 406*   < >  344*  --  252*  --  216*  --  248* 239*  --   BUN 71*   < > 82*  --  91*  --  117*  --  128* 148*  --   CREATININE 5.74*   < > 6.07*  --  6.33*  --  6.68*  --  6.64* 7.48*  --   CALCIUM 8.0*   < > 8.5*  --  8.5*  --  8.3*  --  8.2* 8.0*  --   PHOS  --   --   --   --   --   --   --   --   --   --  5.8*  ALKPHOS 68  --   --   --  61  --  59  --   --  70  --   AST 20  --   --   --  20  --  19  --   --  17  --   ALT 19  --   --   --  17  --  16  --   --  13  --   ALBUMIN 3.1*  --   --   --  2.8*  --  3.0*  --   --  3.0*  --    < > = values in this interval not  displayed.      Results/Tests Pending at Time of Discharge: None   Discharge Instructions: Please refer to Patient Instructions section of EMR for full details.  Patient was counseled important signs and symptoms that should prompt return to medical care, changes in medications, dietary instructions, activity restrictions, and follow up appointments.   Follow-Up Appointments: Follow-up Information    Mullis, Kiersten P, DO. Schedule an appointment as soon as possible for a visit.   Specialty: Family Medicine Contact information: [address] [place] [address] Thatcher, [place], DO 08/13/2019, 7:25 PM PGY-1, [place]

## 2019-08-13 NOTE — Progress Notes (Signed)
Family Medicine Teaching Service Daily Progress Note Intern Pager: [phone]  Patient name: Jeremy [name] Medical record number: [id number] Date of birth: [birth year] Age: 54 y.o. Gender: male  Primary Care Provider: Danna Hefty, DO Consultants: IR, nephrology Code Status: Full  Pt Overview and Major Events to Date:  1/17-admitted  1/21-tunneled HD catheter placed 1/22-transferred to FTPS service  Assessment and Plan:  Acute respiratory failure with hypoxia secondary to COVID-19 Currently breathing well on room air.  X-ray on admission showed new bilateral airspace disease.  Decadron IV 6 mg was initiated on admission (1/17- ) as well as albuterol and antitussives.  Remdesivir was also started for 5-day course from 1/18-1/22.  D-dimer improving from 3.35 on admission to 1.92 1/20.  CRP also improving from 12.4 on admission to 4.1 on 1/20. -Should finish remdesivir today -Continue Decadron for total of 10-day treatment -Oxygen as needed to maintain sats greater than 94% -Monitor respiratory status -Continuous pulse ox and telemetry  AKI on CKD Baseline creatinine 3.3-3.7.  Today's creatinine 6.64.  Renal ultrasound does not show signs of obstruction.  Patient status post placement of tunneled HD catheter. -Nephrology on board appreciate recommendations  -First RRT today  -Next HD Saturday and Monday Wednesday Friday unless signs of recovery -Hold enalapril  Type 2 diabetes Currently on Levemir 18 units twice per day with NovoLog no coverage of 6 units 3 times per day.  Patient required 35 units of fast acting insulin last 24 hours.  CBGs over past 24 hours ranging in the low to mid 200s.  A1c of 9.0 -Increase Levemir to 20 units twice daily -Continue sliding scale -Continue monitor CBGs  History of seizures Home medications include Keppra -Continue Keppra  Essential hypertension Current blood pressure 149/69.  Systolic blood pressure range over the last 24 hours 149-178.   Home medications include hydralazine, Coreg, amlodipine. -Continue home medications  OSA -Continue CPAP at night  FEN/GI: Renal/carb modified PPx: Heparin ppx dosing  Disposition: Pending medical work-up.  Subjective:  Patient without complaints this morning.  Breathing well on room air.  Objective: Temp:  [97.8 F (36.6 C)-98.6 F (37 C)] 98.1 F (36.7 C) (01/21 2013) Pulse Rate:  [75-84] 75 (01/21 2013) Resp:  [15-24] 20 (01/21 1703) BP: (144-178)/(69-91) 149/69 (01/21 2013) SpO2:  [94 %-95 %] 94 % (01/21 2013) Physical Exam: General: Alert and oriented in no apparent distress Heart: Regular rate and rhythm with no murmurs appreciated Lungs: CTA, no respiratory distress on room air Abdomen: Bowel sounds present, no abdominal pain  Laboratory: Recent Labs  Lab 08/09/19 0400 08/10/19 0258 08/11/19 0240  WBC 3.5* 5.5 8.3  HGB 10.3* 10.3* 10.4*  HCT 34.2* 33.0* 32.9*  PLT 187 184 206   Recent Labs  Lab 08/09/19 0400 08/09/19 1849 08/10/19 0258 08/11/19 0240 08/12/19 0809  NA 132*   < > 134* 133* 137  K 6.5*   < > 5.3* 4.6 4.5  CL 104   < > 106 105 105  CO2 17*   < > 15* 15* 16*  BUN 71*   < > 91* 117* 128*  CREATININE 5.74*   < > 6.33* 6.68* 6.64*  CALCIUM 8.0*   < > 8.5* 8.3* 8.2*  PROT 7.5  --  7.1 7.1  --   BILITOT 0.3  --  0.4 0.5  --   ALKPHOS 68  --  61 59  --   ALT 19  --  17 16  --   AST 20  --  20 19  --   GLUCOSE 406*   < > 252* 216* 248*   < > = values in this interval not displayed.      Imaging/Diagnostic Tests: No results found.   Lurline Del, DO 08/13/2019, 5:52 AM PGY-1, [place] Intern pager: [phone], text pages welcome

## 2019-08-13 NOTE — Progress Notes (Signed)
Spoke to Dr. Augustin Coupe about this patient and the fact that this patient was requesting to go home.  Dr. Augustin Coupe recommended patient stay until dialysis tomorrow morning.  Dr. Augustin Coupe also stated he would try to move dialysis earlier in the day tomorrow for the patient.    I called the patient and informed him of this recommendation and that I also suggest the patient stay overnight until his dialysis is complete tomorrow morning.  Patient stated that he understood this and that he did not mean to be difficult but that he was going home today.  I again informed the patient that this would be AGAINST MEDICAL ADVICE and that for his safety we really recommend he stay and complete his dialysis to which the patient replied that he would simply come back tomorrow for his dialysis and that he was going home today.

## 2019-08-13 NOTE — Progress Notes (Addendum)
Renal Navigator received notification from Nephrologist/Dr. Augustin Coupe to refer patient for OP HD treatment. Patient COVID positive, living in [place], and therefore referred to [facility], TTS 12:00pm.  Renal Navigator contacted patient to discuss, which was difficult, because he just pleaded with Renal Navigator to let him leave the hospital today. At first he stated that he is leaving the hospital today and when asked if his MDs have told him that he can leave the hospital, he clarified that he wants to leave the hospital today. Renal Navigator was not told by Dr. Augustin Coupe that patient was ready for discharge today and notes that he has just had his first HD treatment today. Message sent to Dr. Augustin Coupe and Attending/Dr. Vanessa Andover to notify of patient's eagerness to leave. It appears patient is having a difficult time adjusting to dialysis dependence and Dr. Augustin Coupe has spent a great deal of time with him.  Renal Navigator informed patient that Navigator is working as fast as possible on referral, but needs time to process and receive clearance. He will not be able to start in the OP HD clinic as soon as tomorrow, but it is a definite possibility that patient will be able to start on Tuesday, 08/17/19. Renal Navigator has submitted referral to [facility] to request treatment at [place] isolation shift and will follow closely. Renal Navigator has also requested treatment at [facility] for OP HD treatment once he no longer needs to treat in isolation. Seat schedule to follow. Renal Navigator will follow closely.  Alphonzo Cruise, [place] Renal Navigator [phone]

## 2019-08-13 NOTE — Progress Notes (Signed)
Patient has been accepted at [place] isolation shift and can start on Tuesday, 08/17/19. Treatment schedule is TTS 12:00pm. He needs to arrive to the side door of the clinic and call [phone] when he arrives. Dr. Augustin Coupe notified, who will discuss with patient at time of discharge.   [facility] [address] [place], [place]  Alphonzo Cruise, [place] Renal Navigator [phone]

## 2019-08-13 NOTE — Progress Notes (Addendum)
[facility] Progress Note   54 y.o.maleDM HTN HLD OSA Sz CKD4 with a baseline creatininein the 3.3-3.7range in early to mid 2020 presenting with worsening shortness of breath over the past few days prior to admission. By history the girlfriend has Covid and in the ED the patient also tested COVID+. +NSAIDs as well  Assessment/ Plan:   1. AKI on CKDIV with a baseline that was already in the 3.3-3.7 range in early to mid 2020. He stopped going to see his nephrologist with [facility] bec he is a man of faithand also [place] hit so he missed an appt and never rescheduled (new job). If absolutely necessary he's willing to undergo dialysis but even now he's believing that it won't be necessary.I spent a long time again today discussing the possibility of dialysis but he was having a hard time accepting that.His creatinine was 3.5 in 07/2018 at [place]. +NSAID + continued Lasix at home. RIJ TC 08/12/19  -Renal ultrasounddoes not showObstruction. 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   - Appreciate VIR placing TC; he is on for  1st  RRT today. - Plan next HD Sat and then likely MWF next week; I am going to start CLIP'g him as he is likely ESRD now.  Very  high possibility he won't recover function to come off RRT as he had very advanced disease to begin with; will evaluate daily and continue educating the pt as well.  2. COVID+ - per primary team 3. DM 4. HTN 5. H/o sz  Subjective:   Some dyspnea when walking. Hiccups better; denies n/v/ and tolerating PO's.   Objective:   BP (!) 149/69   Pulse 75   Temp 98.1 F (36.7 C) (Oral)   Resp 20   Ht 5\' 8"  (1.727 m)   Wt 131.5 kg   SpO2 94%   BMI 44.09 kg/m   Intake/Output Summary (Last 24 hours) at 08/13/2019 0802 Last data filed at 08/12/2019 2055 Gross per 24 hour  Intake 400 ml  Output --  Net 400 ml   Weight  change:   Physical Exam: GEN: NAD, A&Ox3, NCAT HEENT: No conjunctival pallor, EOMI NECK: Supple, no thyromegaly LUNGS:distant BS CV: RRR, No M/R/G ABD: SNDNT +BS  EXT:Tr-1+lower extremity edema ACCESS: RIJ TC  Imaging: No results found.  Labs: BMET Recent Labs  Lab 08/08/19 1436 08/09/19 0400 08/09/19 1849 08/10/19 0258 08/11/19 0240 08/12/19 0809 08/13/19 0648  NA 135 132* 134* 134* 133* 137 130*  K 5.1 6.5* 5.5* 5.3* 4.6 4.5 4.4  CL 106 104 105 106 105 105 100  CO2 20* 17* 17* 15* 15* 16* 13*  GLUCOSE 275* 406* 344* 252* 216* 248* 239*  BUN 58* 71* 82* 91* 117* 128* 148*  CREATININE 5.11* 5.74* 6.07* 6.33* 6.68* 6.64* 7.48*  CALCIUM 8.3* 8.0* 8.5* 8.5* 8.3* 8.2* 8.0*   CBC Recent Labs  Lab 08/09/19 0400 08/10/19 0258 08/11/19 0240 08/13/19 0648  WBC 3.5* 5.5 8.3 10.7*  NEUTROABS 2.9 4.3 7.1 8.1*  HGB 10.3* 10.3* 10.4* 12.1*  HCT 34.2* 33.0* 32.9* 37.4*  MCV 80.5 79.5* 77.8* 77.0*  PLT 187 184 206 289    Medications:    . albuterol  2 puff Inhalation TID  . amLODipine  10 mg Oral Daily  . vitamin C  500 mg Oral Daily  . aspirin EC  81 mg Oral Daily  .  atorvastatin  40 mg Oral Daily  . carvedilol  6.25 mg Oral BID WC  . Chlorhexidine Gluconate Cloth  6 each Topical Q0600  . dexamethasone (DECADRON) injection  6 mg Intravenous Q24H  . hydrALAZINE  25 mg Oral TID  . insulin aspart  0-15 Units Subcutaneous TID WC  . insulin aspart  0-5 Units Subcutaneous QHS  . insulin aspart  6 Units Subcutaneous TID WC  . insulin detemir  18 Units Subcutaneous BID  . levETIRAcetam  1,000 mg Oral Daily  . pantoprazole  40 mg Oral Daily  . simethicone  80 mg Oral QID  . sodium zirconium cyclosilicate  10 g Oral BID  . zinc sulfate  220 mg Oral Daily      Otelia Santee, MD 08/13/2019, 8:02 AM

## 2019-08-13 NOTE — Progress Notes (Addendum)
Inpatient Diabetes Program Recommendations  AACE/ADA: New Consensus Statement on Inpatient Glycemic Control (2015)  Target Ranges:  Prepandial:   less than 140 mg/dL      Peak postprandial:   less than 180 mg/dL (1-2 hours)      Critically ill patients:  140 - 180 mg/dL   Lab Results  Component Value Date   GLUCAP 227 (H) 08/12/2019   HGBA1C 9.0 (H) 08/08/2019    Review of Glycemic Control Results for SOURISH, ALLENDER (MRN [id number]) as of 08/13/2019 15:37  Ref. Range 08/12/2019 08:22 08/12/2019 11:39 08/12/2019 16:57 08/12/2019 20:11  Glucose-Capillary Latest Ref Range: 70 - 99 mg/dL 240 (H) 246 (H) 240 (H) 227 (H)   Diabetes history: Type 2 DM Outpatient Diabetes medications: Humalog 25 units Q week, Victoza 1.2 mg QD Current orders for Inpatient glycemic control: Levemir 20 units BID, Novolog 6 units TID, Novolog 0-15 units TID, Novolog 0-5 units qHS Decadron 6 mg QD  Inpatient Diabetes Program Recommendations:    Spoke with patient regarding outpatient diabetes management. While attempting to verify medications, patient was unable to remember dosages because it had been "awhile" since having the medication filled due to cost.  Reviewed patient's current A1c of 9.0%. Explained what a A1c is and what it measures. Also reviewed goal A1c with patient, importance of good glucose control @ home, and blood sugar goals. Reviewed patho of DM, need for insulin, role of pancreas, impact of infections such as coronavirus to glucose trends, especially in the setting of steroids, impact of HD and increased risk for hypoglycemia if unsure of insulin dosages, vascular changes and comorbidites.  Patient has a meter and encouraged to check atleast two times per day.  Discussed in detail the risk of hypoglycemia in renal disease, interventions, when to call MD and signs and symptoms. Encouraged to purchase glucose tabs and to follow up with PCP. Patient expresses difficult affording Humalog. Discussed other  options including copay cards as he has insurance. Patient plans to follow up with [facility] and secure chat sent to MD on preferred medications to include: Basaglar and Novolog.  Admits to drinking sugary beverages. Reviewed alternatives and the impact eliminating these could make to his A1C. Also, briefly reviewed plate method and encourage mindfulness of CHO intake.  Patient has no further questions at this time. Case management consult placed.  Thanks, Bronson Curb, MSN, RNC-OB Diabetes Coordinator [phone] ([phone])

## 2019-08-13 NOTE — Discharge Instructions (Signed)
Diabetes Mellitus and Sick Day Management Blood sugar (glucose) can be difficult to control when you are sick. Common illnesses that can cause problems for people with diabetes (diabetes mellitus) include colds, fever, flu (influenza), nausea, vomiting, and diarrhea. These illnesses can cause stress and loss of body fluids (dehydration), and those issues can cause blood glucose levels to increase. Because of this, it is very important to take your insulin and diabetes medicines and eat some form of carbohydrate when you are sick. You should make a plan for days when you are sick (sick day plan) as part of your diabetes management plan. You and your health care provider should make this plan in advance. The following guidelines are intended to help you manage an illness that lasts for about 24 hours or less. Your health care provider may also give you more specific instructions. What do I need to do to manage my blood glucose?   Check your blood glucose every 2-4 hours, or as often as told by your health care provider.  Know your sick day treatment goals. Your target blood glucose levels may be different when you are sick.  If you use insulin, take your usual dose. ? If your blood glucose continues to be too high, you may need to take an additional insulin dose as told by your health care provider.  If you use oral diabetes medicine, you may need to stop taking it if you are not able to eat or drink normally. Ask your health care provider about whether you need to stop taking these medicines while you are sick.  If you use injectable hormone medicines other than insulin to control your diabetes, ask your health care provider about whether you need to stop taking these medicines while you are sick. What else can I do to manage my diabetes when I am sick? Check your ketones  If you have type 1 diabetes, check your urine ketones every 4 hours.  If you have type 2 diabetes, check your urine ketones  as often as told by your health care provider. Drink fluids  Drink enough fluid to keep your urine clear or pale yellow. This is especially important if you have a fever, vomiting, or diarrhea. Those symptoms can lead to dehydration.  Follow any instructions from your health care provider about beverages to avoid. ? Do not drink alcohol, caffeine, or drinks that contain a lot of sugar. Take medicines as directed  Take-over-the-counter and prescription medicines only as told by your health care provider.  Check medicine labels for added sugars. Some medicines may contain sugar or types of sugars that can raise your blood glucose level. What foods can I eat when I am sick?  You need to eat some form of carbohydrates when you are sick. You should eat 45-50 grams (45-50 g) of carbohydrates every 3-4 hours until you feel better. All of the food choices below contain about 15 g of carbohydrates. Plan ahead and keep some of these foods around so you have them if you get sick.  4-6 oz (120-177 mL) carbonated beverage that contains sugar, such as regular (not diet) soda. You may be able to drink carbonated beverages more easily if you open the beverage and let it sit at room temperature for a few minutes before drinking.   of a twin frozen ice pop.  4 oz (120 g) regular gelatin.  4 oz (120 mL) fruit juice.  4 oz (120 g) ice cream or frozen yogurt.  2   oz (60 g) sherbet.  8 oz (240 mL) clear broth or soup.  4 oz (120 g) regular custard.  4 oz (120 g) regular pudding.  8 oz (240 g) plain yogurt.  1 slice bread or toast.  6 saltine crackers.  5 vanilla wafers. Questions to ask your health care provider Consider asking the following questions so you know what to do on days when you are sick:  Should I adjust my diabetes medicines?  How often do I need to check my blood glucose?  What supplies do I need to manage my diabetes at home when I am sick?  What number can I call if I  have questions?  What foods and drinks should I avoid? Contact a health care provider if:  You develop symptoms of diabetic ketoacidosis, such as: ? Fatigue. ? Weight loss. ? Excessive thirst. ? Light-headedness. ? Fruity or sweet-smelling breath. ? Excessive urination. ? Vision changes. ? Confusion or irritability. ? Nausea. ? Vomiting. ? Rapid breathing. ? Pain in the abdomen. ? Feeling flushed.  You are unable to drink fluids without vomiting.  You have any of the following for more than 6 hours: ? Nausea. ? Vomiting. ? Diarrhea.  Your blood glucose is at or above 240 mg/dL (13.3 mmol/L), even after you take an additional insulin dose.  You have a change in how you think, feel, or act (mental status).  You develop another serious illness.  You have been sick or have had a fever for 2 days or longer and you are not getting better. Get help right away if:  Your blood glucose is lower than 54 mg/dL (3.0 mmol/L).  You have difficulty breathing.  You have moderate or high ketone levels in your urine.  You used emergency glucagon to treat low blood glucose. Summary  Blood sugar (glucose) can be difficult to control when you are sick. Common illnesses that can cause problems for people with diabetes (diabetes mellitus) include colds, fever, flu (influenza), nausea, vomiting, and diarrhea.  Illnesses can cause stress and loss of body fluids (dehydration), and those issues can cause blood glucose levels to increase.  Make a plan for days when you are sick (sick day plan) as part of your diabetes management plan. You and your health care provider should make this plan in advance.  It is very important to take your insulin and diabetes medicines and to eat some form of carbohydrate when you are sick.  Contact your health care provider if have problems managing your blood glucose levels when you are sick, or if you have been sick or had a fever for 2 days or longer and are  not getting better. This information is not intended to replace advice given to you by your health care provider. Make sure you discuss any questions you have with your health care provider. Document Revised: 04/05/2016 Document Reviewed: 04/05/2016 Elsevier Patient Education  [place]. Hemoglobin A1c Test Why am I having this test? You may have the hemoglobin A1c test (HbA1c test) done to:  Evaluate your risk for developing diabetes (diabetes mellitus).  Diagnose diabetes.  Monitor long-term control of blood sugar (glucose) in people who have diabetes and help make treatment decisions. This test may be done with other blood glucose tests, such as fasting blood glucose and oral glucose tolerance tests. What is being tested? Hemoglobin is a type of protein in the blood that carries oxygen. Glucose attaches to hemoglobin to form glycated hemoglobin. This test checks the  amount of glycated hemoglobin in your blood, which is a good indicator of the average amount of glucose in your blood during the past 2-3 months. What kind of sample is taken?  A blood sample is required for this test. It is usually collected by inserting a needle into a blood vessel. Tell a health care provider about:  All medicines you are taking, including vitamins, herbs, eye drops, creams, and over-the-counter medicines.  Any blood disorders you have.  Any surgeries you have had.  Any medical conditions you have.  Whether you are pregnant or may be pregnant. How are the results reported? Your results will be reported as a percentage that indicates how much of your hemoglobin has glucose attached to it (is glycated). Your health care provider will compare your results to normal ranges that were established after testing a large group of people (reference ranges). Reference ranges may vary among labs and hospitals. For this test, common reference ranges are:  Adult or child without diabetes:  4-5.6%.  Adult or child with diabetes and good blood glucose control: less than 7%. What do the results mean? If you have diabetes:  A result of less than 7% is considered normal, meaning that your blood glucose is well controlled.  A result higher than 7% means that your blood glucose is not well controlled, and your treatment plan may need to be adjusted. If you do not have diabetes:  A result within the reference range is considered normal, meaning that you are not at high risk for diabetes.  A result of 5.7-6.4% means that you have a high risk of developing diabetes, and you may have prediabetes. Prediabetes is the condition of having a blood glucose level that is higher than it should be, but not high enough for you to be diagnosed with diabetes. Having prediabetes puts you at risk for developing type 2 diabetes (type 2 diabetes mellitus). You may have more tests, including a repeat HbA1c test.  Results of 6.5% or higher on two separate HbA1c tests mean that you have diabetes. You may have more tests to confirm the diagnosis. Abnormally low HbA1c values may be caused by:  Pregnancy.  Severe blood loss.  Receiving donated blood (transfusions).  Low red blood cell count (anemia).  Long-term kidney failure.  Some unusual forms (variants) of hemoglobin. Talk with your health care provider about what your results mean. Questions to ask your health care provider Ask your health care provider, or the department that is doing the test:  When will my results be ready?  How will I get my results?  What are my treatment options?  What other tests do I need?  What are my next steps? Summary  The hemoglobin A1c test (HbA1c test) may be done to evaluate your risk for developing diabetes, to diagnose diabetes, and to monitor long-term control of blood sugar (glucose) in people who have diabetes and help make treatment decisions.  Hemoglobin is a type of protein in the blood that  carries oxygen. Glucose attaches to hemoglobin to form glycated hemoglobin. This test checks the amount of glycated hemoglobin in your blood, which is a good indicator of the average amount of glucose in your blood during the past 2-3 months.  Talk with your health care provider about what your results mean. This information is not intended to replace advice given to you by your health care provider. Make sure you discuss any questions you have with your health care provider. Document Revised: 06/20/2017  Document Reviewed: 02/18/2017 Elsevier Patient Education  [place]. Hyperglycemia Hyperglycemia occurs when the level of sugar (glucose) in the blood is too high. Glucose is a type of sugar that provides the body's main source of energy. Certain hormones (insulin and glucagon) control the level of glucose in the blood. Insulin lowers blood glucose, and glucagon increases blood glucose. Hyperglycemia can result from having too little insulin in the bloodstream, or from the body not responding normally to insulin. Hyperglycemia occurs most often in people who have diabetes (diabetes mellitus), but it can happen in people who do not have diabetes. It can develop quickly, and it can be life-threatening if it causes you to become severely dehydrated (diabetic ketoacidosis or hyperglycemic hyperosmolar state). Severe hyperglycemia is a medical emergency. What are the causes? If you have diabetes, hyperglycemia may be caused by:  Diabetes medicine.  Medicines that increase blood glucose or affect your diabetes control.  Not eating enough, or not eating often enough.  Changes in physical activity level.  Being sick or having an infection. If you have prediabetes or undiagnosed diabetes:  Hyperglycemia may be caused by those conditions. If you do not have diabetes, hyperglycemia may be caused by:  Certain medicines, including steroid medicines, beta-blockers, epinephrine, and thiazide  diuretics.  Stress.  Serious illness.  Surgery.  Diseases of the pancreas.  Infection. What increases the risk? Hyperglycemia is more likely to develop in people who have risk factors for diabetes, such as:  Having a family member with diabetes.  Having a gene for type 1 diabetes that is passed from parent to child (inherited).  Living in an area with cold weather conditions.  Exposure to certain viruses.  Certain conditions in which the body's disease-fighting (immune) system attacks itself (autoimmune disorders).  Being overweight or obese.  Having an inactive (sedentary) lifestyle.  Having been diagnosed with insulin resistance.  Having a history of prediabetes, gestational diabetes, or polycystic ovarian syndrome (PCOS).  Being of American-Indian, African-American, Hispanic/Latino, or Asian/Pacific Islander descent. What are the signs or symptoms? Hyperglycemia may not cause any symptoms. If you do have symptoms, they may include early warning signs, such as:  Increased thirst.  Hunger.  Feeling very tired.  Needing to urinate more often than usual.  Blurry vision. Other symptoms may develop if hyperglycemia gets worse, such as:  Dry mouth.  Loss of appetite.  Fruity-smelling breath.  Weakness.  Unexpected or rapid weight gain or weight loss.  Tingling or numbness in the hands or feet.  Headache.  Skin that does not quickly return to normal after being lightly pinched and released (poor skin turgor).  Abdominal pain.  Cuts or bruises that are slow to heal. How is this diagnosed? Hyperglycemia is diagnosed with a blood test to measure your blood glucose level. This blood test is usually done while you are having symptoms. Your health care provider may also do a physical exam and review your medical history. You may have more tests to determine the cause of your hyperglycemia, such as:  A fasting blood glucose (FBG) test. You will not be allowed  to eat (you will fast) for at least 8 hours before a blood sample is taken.  An A1c (hemoglobin A1c) blood test. This provides information about blood glucose control over the previous 2-3 months.  An oral glucose tolerance test (OGTT). This measures your blood glucose at two times: ? After fasting. This is your baseline blood glucose level. ? Two hours after drinking a beverage that  contains glucose. How is this treated? Treatment depends on the cause of your hyperglycemia. Treatment may include:  Taking medicine to regulate your blood glucose levels. If you take insulin or other diabetes medicines, your medicine or dosage may be adjusted.  Lifestyle changes, such as exercising more, eating healthier foods, or losing weight.  Treating an illness or infection, if this caused your hyperglycemia.  Checking your blood glucose more often.  Stopping or reducing steroid medicines, if these caused your hyperglycemia. If your hyperglycemia becomes severe and it results in hyperglycemic hyperosmolar state, you must be hospitalized and given IV fluids. Follow these instructions at home:  General instructions  Take over-the-counter and prescription medicines only as told by your health care provider.  Do not use any products that contain nicotine or tobacco, such as cigarettes and e-cigarettes. If you need help quitting, ask your health care provider.  Limit alcohol intake to no more than 1 drink per day for nonpregnant women and 2 drinks per day for men. One drink equals 12 oz of beer, 5 oz of wine, or 1 oz of hard liquor.  Learn to manage stress. If you need help with this, ask your health care provider.  Keep all follow-up visits as told by your health care provider. This is important. Eating and drinking   Maintain a healthy weight.  Exercise regularly, as directed by your health care provider.  Stay hydrated, especially when you exercise, get sick, or spend time in hot  temperatures.  Eat healthy foods, such as: ? Lean proteins. ? Complex carbohydrates. ? Fresh fruits and vegetables. ? Low-fat dairy products. ? Healthy fats.  Drink enough fluid to keep your urine clear or pale yellow. If you have diabetes:  Make sure you know the symptoms of hyperglycemia.  Follow your diabetes management plan, as told by your health care provider. Make sure you: ? Take your insulin and medicines as directed. ? Follow your exercise plan. ? Follow your meal plan. Eat on time, and do not skip meals. ? Check your blood glucose as often as directed. Make sure to check your blood glucose before and after exercise. If you exercise longer or in a different way than usual, check your blood glucose more often. ? Follow your sick day plan whenever you cannot eat or drink normally. Make this plan in advance with your health care provider.  Share your diabetes management plan with people in your workplace, school, and household.  Check your urine for ketones when you are ill and as told by your health care provider.  Carry a medical alert card or wear medical alert jewelry. Contact a health care provider if:  Your blood glucose is at or above 240 mg/dL (13.3 mmol/L) for 2 days in a row.  You have problems keeping your blood glucose in your target range.  You have frequent episodes of hyperglycemia. Get help right away if:  You have difficulty breathing.  You have a change in how you think, feel, or act (mental status).  You have nausea or vomiting that does not go away. These symptoms may represent a serious problem that is an emergency. Do not wait to see if the symptoms will go away. Get medical help right away. Call your local emergency services (911 in the [place] Do not drive yourself to the hospital. Summary  Hyperglycemia occurs when the level of sugar (glucose) in the blood is too high.  Hyperglycemia is diagnosed with a blood test to measure your blood  glucose level. This blood test is usually done while you are having symptoms. Your health care provider may also do a physical exam and review your medical history.  If you have diabetes, follow your diabetes management plan as told by your health care provider.  Contact your health care provider if you have problems keeping your blood glucose in your target range. This information is not intended to replace advice given to you by your health care provider. Make sure you discuss any questions you have with your health care provider. Document Revised: 03/25/2016 Document Reviewed: 03/25/2016 Elsevier Patient Education  [place]. Hypocalcemia, Adult Hypocalcemia is when the level of calcium in a person's blood is below normal. Calcium is a mineral that is used by the body in many ways. Not having enough blood calcium can affect the nervous system. This can lead to problems with muscles, the heart, and the brain. What are the causes? This condition may be caused by:  A deficiency of vitamin D or magnesium or both.  Decreased levels of parathyroid hormone (hypoparathyroidism).  Kidney function problems.  Low levels of a body protein called albumin.  Inflammation of the pancreas (pancreatitis).  Not taking in enough vitamins and minerals in the diet or having intestinal problems that interfere with nutrient absorption.  Certain medicines. What are the signs or symptoms? Some people may not have any symptoms, especially if they have long-term (chronic) hypocalcemia. Symptoms of this condition may include:  Numbness and tingling in the fingers, toes, or around the mouth.  Muscle twitching, aches, or cramps, especially in the legs, feet, and back.  Spasm of the voice box (laryngospasm). This may make it difficult to breath or speak.  Fast heartbeats (palpitations) and abnormal heart rhythms (arrhythmias).  Shaking uncontrollably (seizures).  Memory problems, confusion, or  difficulty thinking.  Depression, anxiety, irritability, or changes in personality. Long-term symptoms of this condition may include:  Coarse, brittle hair and nails.  Dry skin or lasting skin diseases (psoriasis, eczema, or dermatitis).  Dental cavities.  Clouding of the eye lens (cataracts). How is this diagnosed?  This condition is usually diagnosed with a blood test. You may also have other tests to help determine the underlying cause of the condition. This may include more blood tests and imaging tests. How is this treated? This condition may be treated with:  Calcium given by mouth (orally) or given through an IV. The method used for giving calcium will depend on the severity of the condition. If your condition is severe, you may need to be closely monitored in the hospital.  Giving other minerals (electrolytes), such as magnesium. Other treatment will depend on the cause of the condition. Follow these instructions at home:  Follow diet instructions from your health care provider or dietitian.  Take supplements only as told by your health care provider.  Keep all follow-up visits as told by your health care provider. This is important. Contact a health care provider if you:  Have increased muscle twitching or cramps.  Have new swelling in the feet, ankles, or legs.  Develop changes in mood, memory, or personality. Get help right away if you:  Have chest pain.  Have persistent rapid or irregular heartbeats.  Have difficulty breathing.  Faint.  Start to have seizures.  Have confusion. Summary  Hypocalcemia is when the level of calcium in a person's blood is below normal. Not having enough blood calcium can affect the nervous system. This can lead to problems with muscles, the  heart, and the brain.  This condition may be treated with calcium given by mouth or through an IV, taking other minerals, and treating the underlying cause of hypocalcemia.  Take  supplements only as told by your health care provider.  Contact a health care provider if you have new or worsening symptoms.  Keep all follow-up visits as told by your health care provider. This is important. This information is not intended to replace advice given to you by your health care provider. Make sure you discuss any questions you have with your health care provider. Document Revised: 07/17/2018 Document Reviewed: 07/17/2018 Elsevier Patient Education  2020 [employer].

## 2019-08-14 ENCOUNTER — Telehealth: Payer: Self-pay | Admitting: Nurse Practitioner

## 2019-08-14 NOTE — Telephone Encounter (Signed)
Date of discharge: 08/13/2019 Left AMA Date of contact: 08/14/2019 Method of contact: Phone  Attempted to contact patients to discuss transition of care from inpatient admission. Patient did not answer the phone. Patient's voicemail has not been set up, could not leave message.

## 2019-08-15 ENCOUNTER — Telehealth: Payer: Self-pay | Admitting: Nurse Practitioner

## 2019-08-15 NOTE — Telephone Encounter (Signed)
Date of discharge: 08/13/2019 Left AMA Date of contact: 08/15/2019 Method of contact: Phone  Attempted to contact patients to discuss transition of care from inpatient admission. Patient did not answer the phone. Patient's voicemail has not been set up, could not leave message.

## 2019-08-16 ENCOUNTER — Telehealth: Payer: Self-pay | Admitting: Nephrology

## 2019-08-16 DIAGNOSIS — R0602 Shortness of breath: Secondary | ICD-10-CM | POA: Insufficient documentation

## 2019-08-16 DIAGNOSIS — T782XXA Anaphylactic shock, unspecified, initial encounter: Secondary | ICD-10-CM | POA: Insufficient documentation

## 2019-08-16 DIAGNOSIS — D688 Other specified coagulation defects: Secondary | ICD-10-CM | POA: Insufficient documentation

## 2019-08-16 DIAGNOSIS — T829XXA Unspecified complication of cardiac and vascular prosthetic device, implant and graft, initial encounter: Secondary | ICD-10-CM | POA: Insufficient documentation

## 2019-08-16 DIAGNOSIS — D631 Anemia in chronic kidney disease: Secondary | ICD-10-CM | POA: Insufficient documentation

## 2019-08-16 DIAGNOSIS — L299 Pruritus, unspecified: Secondary | ICD-10-CM | POA: Insufficient documentation

## 2019-08-16 DIAGNOSIS — E1151 Type 2 diabetes mellitus with diabetic peripheral angiopathy without gangrene: Secondary | ICD-10-CM | POA: Insufficient documentation

## 2019-08-16 DIAGNOSIS — D509 Iron deficiency anemia, unspecified: Secondary | ICD-10-CM | POA: Insufficient documentation

## 2019-08-16 DIAGNOSIS — D689 Coagulation defect, unspecified: Secondary | ICD-10-CM | POA: Insufficient documentation

## 2019-08-16 DIAGNOSIS — R509 Fever, unspecified: Secondary | ICD-10-CM | POA: Insufficient documentation

## 2019-08-16 DIAGNOSIS — T7840XA Allergy, unspecified, initial encounter: Secondary | ICD-10-CM | POA: Insufficient documentation

## 2019-08-16 NOTE — Telephone Encounter (Signed)
Transition of Care Contact from [place]   Date of Discharge: 08/13/19 Date of Contact: 08/16/19 Method of contact: phone Talked to patient   Patient contacted to discuss transition of care form recent hospitaliztion. Patient was admitted to [facility] from 08/08/19 and left AMA on1/22/21 with the discharge diagnosis of COVID 19 and AKI CKD4 to ESRD.      Medication changes were reviewed - no changes to medication noted b/c left AMA, advised to bring all meds to dialysis tomorrow so med list will be accurate and able to make adjustment as needed pertaining to dialysis.    Patient will follow up with is outpatient dialysis center 08/17/19.   Other follow up needs include follow appointment with PCP for diabetic needs.  Advised to call and set up ASAP.    Jen Mow, PA-C [facility] Pager: [phone]

## 2019-08-17 DIAGNOSIS — N2581 Secondary hyperparathyroidism of renal origin: Secondary | ICD-10-CM | POA: Diagnosis not present

## 2019-08-17 DIAGNOSIS — U071 COVID-19: Secondary | ICD-10-CM | POA: Diagnosis not present

## 2019-08-17 DIAGNOSIS — T8249XA Other complication of vascular dialysis catheter, initial encounter: Secondary | ICD-10-CM | POA: Diagnosis not present

## 2019-08-17 DIAGNOSIS — Z992 Dependence on renal dialysis: Secondary | ICD-10-CM | POA: Diagnosis not present

## 2019-08-17 DIAGNOSIS — E1151 Type 2 diabetes mellitus with diabetic peripheral angiopathy without gangrene: Secondary | ICD-10-CM | POA: Diagnosis not present

## 2019-08-17 DIAGNOSIS — N186 End stage renal disease: Secondary | ICD-10-CM | POA: Diagnosis not present

## 2019-08-17 DIAGNOSIS — I129 Hypertensive chronic kidney disease with stage 1 through stage 4 chronic kidney disease, or unspecified chronic kidney disease: Secondary | ICD-10-CM | POA: Diagnosis not present

## 2019-08-19 DIAGNOSIS — N2581 Secondary hyperparathyroidism of renal origin: Secondary | ICD-10-CM | POA: Diagnosis not present

## 2019-08-19 DIAGNOSIS — U071 COVID-19: Secondary | ICD-10-CM | POA: Diagnosis not present

## 2019-08-19 DIAGNOSIS — Z992 Dependence on renal dialysis: Secondary | ICD-10-CM | POA: Diagnosis not present

## 2019-08-19 DIAGNOSIS — T8249XA Other complication of vascular dialysis catheter, initial encounter: Secondary | ICD-10-CM | POA: Diagnosis not present

## 2019-08-19 DIAGNOSIS — N186 End stage renal disease: Secondary | ICD-10-CM | POA: Diagnosis not present

## 2019-08-21 DIAGNOSIS — T8249XA Other complication of vascular dialysis catheter, initial encounter: Secondary | ICD-10-CM | POA: Diagnosis not present

## 2019-08-21 DIAGNOSIS — N186 End stage renal disease: Secondary | ICD-10-CM | POA: Diagnosis not present

## 2019-08-21 DIAGNOSIS — Z992 Dependence on renal dialysis: Secondary | ICD-10-CM | POA: Diagnosis not present

## 2019-08-21 DIAGNOSIS — N2581 Secondary hyperparathyroidism of renal origin: Secondary | ICD-10-CM | POA: Diagnosis not present

## 2019-08-23 ENCOUNTER — Other Ambulatory Visit: Payer: Self-pay

## 2019-08-23 ENCOUNTER — Telehealth (INDEPENDENT_AMBULATORY_CARE_PROVIDER_SITE_OTHER): Payer: BC Managed Care – PPO | Admitting: Family Medicine

## 2019-08-23 DIAGNOSIS — E1165 Type 2 diabetes mellitus with hyperglycemia: Secondary | ICD-10-CM | POA: Diagnosis not present

## 2019-08-23 DIAGNOSIS — E1122 Type 2 diabetes mellitus with diabetic chronic kidney disease: Secondary | ICD-10-CM | POA: Diagnosis not present

## 2019-08-23 DIAGNOSIS — E46 Unspecified protein-calorie malnutrition: Secondary | ICD-10-CM | POA: Insufficient documentation

## 2019-08-23 DIAGNOSIS — Z794 Long term (current) use of insulin: Secondary | ICD-10-CM

## 2019-08-23 DIAGNOSIS — N184 Chronic kidney disease, stage 4 (severe): Secondary | ICD-10-CM | POA: Diagnosis not present

## 2019-08-23 DIAGNOSIS — U071 COVID-19: Secondary | ICD-10-CM

## 2019-08-23 DIAGNOSIS — E1121 Type 2 diabetes mellitus with diabetic nephropathy: Secondary | ICD-10-CM

## 2019-08-23 DIAGNOSIS — IMO0002 Reserved for concepts with insufficient information to code with codable children: Secondary | ICD-10-CM

## 2019-08-23 MED ORDER — MICROLET NEXT LANCING DEVICE MISC: 1.0000 | Freq: Three times a day (TID) | 11 refills | Status: DC

## 2019-08-23 MED ORDER — GLUCOSE BLOOD VI STRP: ORAL_STRIP | 12 refills | Status: DC

## 2019-08-23 MED ORDER — MICROLET LANCETS MISC: 1.0000 | Freq: Three times a day (TID) | 11 refills | Status: DC

## 2019-08-23 MED ORDER — CONTOUR NEXT MONITOR W/DEVICE KIT: 1.0000 | PACK | Freq: Three times a day (TID) | 0 refills | Status: DC

## 2019-08-23 NOTE — Progress Notes (Signed)
[facility] Telemedicine Visit  Patient consented to have virtual visit. Method of visit: Video  Encounter participants: Patient: Jeremy [name] - located at home Provider: Danna Hefty - located at [facility] Others (if applicable): None  Chief Complaint: Hospital Follow up  HPI: COVID-19 Infection: Patient hospitalized for COVID-19 from 1/17-1/22 in which he left AMA. He completed a 5 day course of Remdesivir and 10 day course of Dexamethasone. He notes he thinks he completed these. He does endorse some continued shortness of breath but is able to walk short distances fine. Eating, voiding, and stooling normal. Denies any fever. Occasional cough. Otherwise minimal symptoms. He does feel like he is improving.   CKD: He has been going to dialysis as scheduled. Last dialysis on Saturday, and next is tomorrow. He notes they check vitals but do not check his O2 sats.   Diabetes: He notes he cant afford his medicines and thus is not taking any medications. Notes he is supposed to be taking Humalog with each meal and at night but his meter has been broken thus he has not been taking anything. He has not been able to take the Victoza as he cant afford it. He notes he isnt able to afford any of his medicines now that he has [place].  ROS: per HPI  Pertinent PMHx: Recent COVID-19 infection, CKD IV, uncontrolled Type 2 diabetes   Exam:  Respiratory: Speaking in full sentences  Assessment/Plan:  COVID-19 virus infection Continues to have SOB with exertion but overall endorses improvement. Patient has been afebrile since discharge and thus appears to be out of quarantine period. He continues to be tested for COVID-19 at his Dialysis center. Patient instructed to continue to monitor his symptoms and if appears to be worsening he should report to the ED for further evaluation.  Uncontrolled type 2 diabetes mellitus with diabetic nephropathy, with long-term current use of  insulin ([place]) A1C significantly worsened from 7.7 in August 2020 to 9.0 in Jan 2121. Patient is not on any form of concrete regimen. He is on short acting 4 times a day, no long acting, and intermittent use of his GLP-1. Cost of medications appear to be a big barrier to patient. Believe patient would benefit from SGLT-2 and long acting insulin however this may also be limited due to cost. Patient to follow up with Dr. Valentina Lucks to discuss diabetes regimen. Will also refer patient to chronic care management for further assistance.  - Refill of all diabetes supplies sent to pharmacy - Patient informed to let me know if these are not covered by insurance - Patient to keep strict log of blood sugars to allow better assessment of diabetes regimen. He understood and agreed to plan.   Chronic kidney disease (CKD), stage IV (severe) ([place]) Patient follows closely with nephrology. Required initiation of dialysis during most recent hospitalization in Jan 2021 due to worsening kidney function. Most recent Cr of 7.4 with GFR of 9. Patient is not thrilled with this and is hopeful this will be temporary. He is attending dialysis as scheduled and denies any difficulties or concerns.   Social Concerns: Patient is very distraut about everythign going on (recent initiation of dialysis, COVID-19 infection, shortness of breath, inability to work). He asked multiple questions about the process and how to apply for disability. Instructed patient to have family member drop off forms to be filled out to help him with this process. Will also refer patient to CCM to provide resources in hopes  this will ease some of his concerns.   Time spent during visit with patient: 25 minutes  Mina Marble, [place], PGY2 08/23/19

## 2019-08-24 ENCOUNTER — Telehealth: Payer: Self-pay | Admitting: Family Medicine

## 2019-08-24 ENCOUNTER — Telehealth: Payer: Self-pay | Admitting: Pharmacist

## 2019-08-24 DIAGNOSIS — Z992 Dependence on renal dialysis: Secondary | ICD-10-CM | POA: Diagnosis not present

## 2019-08-24 DIAGNOSIS — U071 COVID-19: Secondary | ICD-10-CM | POA: Diagnosis not present

## 2019-08-24 DIAGNOSIS — N186 End stage renal disease: Secondary | ICD-10-CM | POA: Diagnosis not present

## 2019-08-24 DIAGNOSIS — N2581 Secondary hyperparathyroidism of renal origin: Secondary | ICD-10-CM | POA: Diagnosis not present

## 2019-08-24 DIAGNOSIS — T8249XA Other complication of vascular dialysis catheter, initial encounter: Secondary | ICD-10-CM | POA: Diagnosis not present

## 2019-08-24 MED ORDER — OZEMPIC (0.25 OR 0.5 MG/DOSE) 2 MG/1.5ML ~~LOC~~ SOPN: 0.5000 mg | PEN_INJECTOR | SUBCUTANEOUS | 0 refills | Status: DC

## 2019-08-24 NOTE — Telephone Encounter (Signed)
Contacted patient RE medication supply.  He states his insurance is not covering his meds at low cost.   Requested Ozempic sample.   States he is only using short-acting insulin at this time (denies any long-acting insulin use).  He also reports his meter is not working and believes he needs a new battery.    Medication Samples have been provided for patient pick-up today.  Drug name: Ozempic (semaglutide)       Strength: 0.25/0.5mg  dose        Qty: 3 pens  LOT: [id number]  Exp.Date: 08/21/2021  Dosing instructions: once weekly 0.25mg    The patient has been instructed regarding the correct time, dose, and frequency of taking this medication, including desired effects and most common side effects.   Janeann Forehand 3:01 PM 08/24/2019  Discussed how to replace glucometer battery.  Anticipate he should be able to replace.  REassess at next visit.  We scheduled Rx clinic appointment for 2/4 at 9:00 AM - asked to bring all medications.  Consider increased dose of Ozempic at that time.

## 2019-08-24 NOTE — Telephone Encounter (Signed)
Pt girlfriend is dropping off leave forms for pt's employer. I have placed form in red team folder.   Last DOS : 08/23/2019.

## 2019-08-25 NOTE — Assessment & Plan Note (Signed)
A1C significantly worsened from 7.7 in August 2020 to 9.0 in Jan 2121. Patient is not on any form of concrete regimen. He is on short acting 4 times a day, no long acting, and intermittent use of his GLP-1. Cost of medications appear to be a big barrier to patient. Believe patient would benefit from SGLT-2 and long acting insulin however this may also be limited due to cost. Patient to follow up with Dr. Valentina Lucks to discuss diabetes regimen. Will also refer patient to chronic care management for further assistance.  - Refill of all diabetes supplies sent to pharmacy - Patient informed to let me know if these are not covered by insurance - Patient to keep strict log of blood sugars to allow better assessment of diabetes regimen. He understood and agreed to plan.

## 2019-08-25 NOTE — Assessment & Plan Note (Signed)
Continues to have SOB with exertion but overall endorses improvement. Patient has been afebrile since discharge and thus appears to be out of quarantine period. He continues to be tested for COVID-19 at his Dialysis center. Patient instructed to continue to monitor his symptoms and if appears to be worsening he should report to the ED for further evaluation.

## 2019-08-25 NOTE — Assessment & Plan Note (Addendum)
Patient follows closely with nephrology. Required initiation of dialysis during most recent hospitalization in Jan 2021 due to worsening kidney function. Most recent Cr of 7.4 with GFR of 9. Patient is not thrilled with this and is hopeful this will be temporary. He is attending dialysis as scheduled and denies any difficulties or concerns.

## 2019-08-25 NOTE — Telephone Encounter (Signed)
Reviewed and placed in PCP's box for completion.  .Jaynia Fendley R Fares Ramthun, CMA  

## 2019-08-26 ENCOUNTER — Other Ambulatory Visit: Payer: Self-pay

## 2019-08-26 ENCOUNTER — Ambulatory Visit (INDEPENDENT_AMBULATORY_CARE_PROVIDER_SITE_OTHER): Payer: BC Managed Care – PPO | Admitting: Pharmacist

## 2019-08-26 ENCOUNTER — Encounter: Payer: Self-pay | Admitting: Pharmacist

## 2019-08-26 DIAGNOSIS — E1121 Type 2 diabetes mellitus with diabetic nephropathy: Secondary | ICD-10-CM | POA: Diagnosis not present

## 2019-08-26 DIAGNOSIS — N186 End stage renal disease: Secondary | ICD-10-CM | POA: Diagnosis not present

## 2019-08-26 DIAGNOSIS — I10 Essential (primary) hypertension: Secondary | ICD-10-CM

## 2019-08-26 DIAGNOSIS — Z794 Long term (current) use of insulin: Secondary | ICD-10-CM

## 2019-08-26 DIAGNOSIS — N2581 Secondary hyperparathyroidism of renal origin: Secondary | ICD-10-CM | POA: Diagnosis not present

## 2019-08-26 DIAGNOSIS — T8249XA Other complication of vascular dialysis catheter, initial encounter: Secondary | ICD-10-CM | POA: Diagnosis not present

## 2019-08-26 DIAGNOSIS — IMO0002 Reserved for concepts with insufficient information to code with codable children: Secondary | ICD-10-CM

## 2019-08-26 DIAGNOSIS — E1169 Type 2 diabetes mellitus with other specified complication: Secondary | ICD-10-CM | POA: Diagnosis not present

## 2019-08-26 DIAGNOSIS — E782 Mixed hyperlipidemia: Secondary | ICD-10-CM

## 2019-08-26 DIAGNOSIS — Z992 Dependence on renal dialysis: Secondary | ICD-10-CM | POA: Diagnosis not present

## 2019-08-26 DIAGNOSIS — E1165 Type 2 diabetes mellitus with hyperglycemia: Secondary | ICD-10-CM

## 2019-08-26 MED ORDER — INSULIN LISPRO (1 UNIT DIAL) 100 UNIT/ML (KWIKPEN): 15.0000 [IU] | PEN_INJECTOR | Freq: Two times a day (BID) | SUBCUTANEOUS | Status: DC

## 2019-08-26 MED ORDER — ATORVASTATIN CALCIUM 40 MG PO TABS: 40.0000 mg | ORAL_TABLET | Freq: Every day | ORAL | 3 refills | Status: DC

## 2019-08-26 MED ORDER — OZEMPIC (0.25 OR 0.5 MG/DOSE) 2 MG/1.5ML ~~LOC~~ SOPN: 0.5000 mg | PEN_INJECTOR | SUBCUTANEOUS | 0 refills | Status: DC

## 2019-08-26 NOTE — Progress Notes (Signed)
S:     Chief Complaint  Patient presents with  . Medication Management    Diabetes - Medication Supply    Patient arrives in good spirits, ambulating without assistance.  Presents for diabetes evaluation, education, and management.   Patient was referred and last seen by Primary Care Provider, Dr. Tarry Kos 08/23/2019 (telemedicine).   Patient reports Diabetes was diagnosed in 2004  Insurance coverage/medication affordability: change from [facility] to [place] has created some increases in drug costs.  Supply of all medications is adequate at this time.   Patient reports adherence with DM and HTN medications, however, has been administering bolus insulin without regards to checking blood glucose readings (meter was not working).  Current diabetes medications include: Ozempic 0.25 mg weekly (Mondays), Humalog 26 units 1-2x per day with breakfast and dinner (1x when he did not have meter; 2x when he did have a functional meter) Current hypertension medications include: Enalapril 5 mg daily, amlodipine 10 mg, hydralazine 25 mg TID Current hyperlipidemia medications include: atorvastatin 40 mg daily (not taking as prescribed)  Patient denies hypoglycemic events.  Patient denies nocturia (nighttime urination) - dialysis   O:  Physical Exam Constitutional:      Appearance: Normal appearance. He is obese.  Pulmonary:     Comments: Positive for orthopnea Psychiatric:        Mood and Affect: Mood normal.        Behavior: Behavior normal.        Thought Content: Thought content normal.        Judgment: Judgment normal.    Review of Systems  All other systems reviewed and are negative.    Lab Results  Component Value Date   HGBA1C 9.0 (H) 08/08/2019   There were no vitals filed for this visit.  Lipid Panel     Component Value Date/Time   CHOL 199 04/03/2018 0842   TRIG 126 08/08/2019 1502   HDL 48 04/03/2018 0842   CHOLHDL 4.1 04/03/2018 0842   CHOLHDL 3.1 02/09/2016 1045   VLDL  11 02/09/2016 1045   LDLCALC 129 (H) 04/03/2018 0842    Blood glucose reading per glucometer ~141 (last reading 08/13/19) **In office appt today BG was 448 (on patient's meter - post prandial - did not take any rapid insulin this AM)   Clinical Atherosclerotic Cardiovascular Disease (ASCVD): No  The 10-year ASCVD risk score Mikey Bussing DC Jr., et al., 2013) is: 25.6%   Values used to calculate the score:     Age: 54 years     Sex: Male     Is Non-Hispanic African American: Yes     Diabetic: Yes     Tobacco smoker: No     Systolic Blood Pressure: 638 mmHg     Is BP treated: Yes     HDL Cholesterol: 48 mg/dL     Total Cholesterol: 199 mg/dL    A/P: Diabetes longstanding since 2004 currently uncontrolled. Patient is adherent with DM medication. Control is suboptimal due to lack of access to blood glucose meter, diet, and exercise. -ADJUSTED rapid insulin Humalog (insulin lispro) to the following sliding scale regimen: --15 units prior to a meal if your pre-meal blood sugar is 150-200 --20 units if your blood sugar is 201-300 --25 units if your blood sugar is > 300 -INCREASED GLP-1 Ozempic (generic name semaglutide) from 0.25 mg to 0.5 mg weekly (Mondays) -Extensively discussed pathophysiology of diabetes, recommended lifestyle interventions, dietary effects on blood sugar control -Counseled on s/sx of and  management of hypoglycemia -Next A1C anticipated 10/2019  ASCVD risk - primary prevention in patient with diabetes. Last LDL is not controlled, however, last lipid panel was drawn in 2019. ASCVD risk score is >20%  - high intensity statin indicated. Aspirin could be considered for primary prevention considering patient has low bleeding risk - will defer conversation for follow up appt.  -RESTART atorvastatin 40 mg daily  Hypertension longstanding currently slightly above goal.  Blood pressure goal = <130/80 mmHg. Patient medication adherence is optimal.  Blood pressure control is  suboptimal, however, blood pressure could be elevated in office due to stress. -Continue enalapril 5 mg daily, amlodipine 10 mg daily, and hydralazine 25 mg TID.  Written patient instructions provided.  Total time in face to face counseling 52 minutes.   Follow up Pharmacist via telephone in 7-10 days.   Patient seen with Loleta Rose, PharmD Candidate, Acey Lav, PharmD PGY-1 Resident and Drexel Iha, PharmD,  PGY2 Pharmacy Resident.

## 2019-08-26 NOTE — Patient Instructions (Addendum)
Great to see you today.     Increase Ozempic to 0.5mg  once weekly.   Decrease your Humalog to  15units prior to a meal if your pre-meal blood sugar is 150-200 20 units if your blood sugar is 201-300 25 units if your blood sugar is > 300  Start Atorvastatin 40mg  once daily.   We plan to call you in 10-14 days to check how your blood sugar is doing.

## 2019-08-26 NOTE — Assessment & Plan Note (Signed)
ASCVD risk - primary prevention in patient with diabetes. Last LDL is not controlled, however, last lipid panel was drawn in 2019. ASCVD risk score is >20% and high intensity statin indicated. Aspirin could be considered for primary prevention considering patient has low bleeding risk - will defer conversation for follow up appt.  -RESTART atorvastatin 40 mg daily

## 2019-08-26 NOTE — Progress Notes (Signed)
Reviewed: I agree with the documentation and management of Dr. Koval. 

## 2019-08-26 NOTE — Assessment & Plan Note (Signed)
Diabetes longstanding since 2004 currently uncontrolled. Patient is adherent with DM medication. Control is suboptimal due to lack of access to blood glucose meter, diet, and exercise. -ADJUSTED rapid insulin Humalog (insulin lispro) to the following sliding scale regimen: --15 units prior to a meal if your pre-meal blood sugar is 150-200 --20 units if your blood sugar is 201-300 --25 units if your blood sugar is > 300 -INCREASED GLP-1 Ozempic (generic name semaglutide) from 0.25 mg to 0.5 mg weekly (Mondays) -Extensively discussed pathophysiology of diabetes, recommended lifestyle interventions, dietary effects on blood sugar control -Counseled on s/sx of and management of hypoglycemia -Next A1C anticipated 10/2019

## 2019-08-27 ENCOUNTER — Telehealth: Payer: Self-pay | Admitting: Family Medicine

## 2019-08-27 DIAGNOSIS — N2581 Secondary hyperparathyroidism of renal origin: Secondary | ICD-10-CM | POA: Diagnosis not present

## 2019-08-27 DIAGNOSIS — Z992 Dependence on renal dialysis: Secondary | ICD-10-CM | POA: Diagnosis not present

## 2019-08-27 DIAGNOSIS — T8249XA Other complication of vascular dialysis catheter, initial encounter: Secondary | ICD-10-CM | POA: Diagnosis not present

## 2019-08-27 DIAGNOSIS — N186 End stage renal disease: Secondary | ICD-10-CM | POA: Diagnosis not present

## 2019-08-27 NOTE — Chronic Care Management (AMB) (Signed)
  Care Management   Note  08/27/2019 Name: Jeremy [name] MRN: [id number] DOB: [birth year]  Jeremy [name] is a 54 y.o. year old male who is a primary care patient of Danna Hefty, DO. I reached out to [name] by phone today in response to a referral sent by [name] health plan.    [name] was given information about care management services today including:  1. Care management services include personalized support from designated clinical staff supervised by his physician, including individualized plan of care and coordination with other care providers 2. 24/7 contact phone numbers for assistance for urgent and routine care needs. 3. The patient may stop care management services at any time by phone call to the office staff.  Patient agreed to services and verbal consent obtained.   Follow up plan: Telephone appointment with care management team member scheduled for:08/31/2019 and 09/02/2019  Glenna Durand, LPN Health Advisor, [place] Management ??[name] ??[phone]

## 2019-08-30 ENCOUNTER — Telehealth: Payer: Self-pay | Admitting: Pharmacist

## 2019-08-30 DIAGNOSIS — N186 End stage renal disease: Secondary | ICD-10-CM | POA: Diagnosis not present

## 2019-08-30 DIAGNOSIS — N2581 Secondary hyperparathyroidism of renal origin: Secondary | ICD-10-CM | POA: Diagnosis not present

## 2019-08-30 DIAGNOSIS — T8249XA Other complication of vascular dialysis catheter, initial encounter: Secondary | ICD-10-CM | POA: Diagnosis not present

## 2019-08-30 DIAGNOSIS — Z992 Dependence on renal dialysis: Secondary | ICD-10-CM | POA: Diagnosis not present

## 2019-08-30 NOTE — Telephone Encounter (Signed)
Called [employer] insurance representative on 08/30/2019 at 4:40 PM.   Representative was able to run test claims for the following medications: 1. Ozempic (tier 3): $466.16 for 30 day supply  2. Victoza (tier 3): $283 for 30 day supply 3. Trulicity (tier 3): $371.06 for 30 day supply 4. Lantus (tier 2): $30 for 30 day supply 5. Basaglar - requires prior authorization  Patient has a deductible of $250 for tier 3 and 7 medications. After deductible is met patient will be responsible for 35% of copay.  1. Ozempic (tier 3): $163.16 for 30 day supply  2. Victoza (tier 3): $99.05 for 30 day supply 3. Trulicity (tier 3): $26.94 for 30 day supply  Once patient has paid $8,150 for prescription medications, patient will have a $0 copay with any prescription medication.  Called patient to discuss insurance information. Explained to patient best option for him would be to pay one month for Victoza (will cost $283) to fulfill $250 deductible. Afterwards copay would be $99 for 1 month supply of Victoza. Patient would be able to use Victoza copay card to bring costs down to $0 for 30 day supply. Patient states that saving up to pay $283 once for Victoza will not be possible as it is still too expensive.   Discussed with patient his other options 1) continue on GLP-1 agonist and refer patient to [facility] (supply patient with samples of Ozempic in the interim timeframe) or 2) switch patient from GLP-1 agonist to Lantus then titrate basal-bolus regimen accordingly. Discussed with patient it typically takes a few months to be seen by a provider at [facility]. Patient reiterates his concern with costs of medications and would prefer to be referred to [facility]. Instructed patient to call [facility] (provided him with phone number [phone])) and instructed him to contact Dr. Valentina Lucks (provided him with phone number [phone])) to determine  how long we would be following with him until he is able to be seen by a provider at [facility]; patient stated he will call and start this process tomorrow (08/30/2019).  Asked patient if he started Ozempic 0.5 mg dose - patient denied. He states he is planning on titrating from 0.25 mg to 0.5 mg dose tonight (08/30/2019). Informed patient to contact Dr. Valentina Lucks if he is unable to tolerate increased dose - patient verbalized understanding. Plan to follow up with patient on 09/09/2019 to re-assess DM management.  Thank you for involving pharmacy to assist in providing this patient's care.   Drexel Iha, PharmD PGY2 Ambulatory Care Pharmacy Resident

## 2019-08-31 ENCOUNTER — Ambulatory Visit: Payer: BC Managed Care – PPO

## 2019-08-31 ENCOUNTER — Other Ambulatory Visit: Payer: Self-pay

## 2019-08-31 ENCOUNTER — Telehealth: Payer: Self-pay | Admitting: Pharmacist

## 2019-08-31 NOTE — Chronic Care Management (AMB) (Signed)
Care Management   Initial Visit Note  08/31/2019 Name: Jeremy [name] MRN: [id number] DOB: February 25, 1966   Assessment: Jeremy [name] is a 54 y.o. year old male who sees Jeremy Hefty, DO for primary care. The care management team was consulted for assistance with care management and care coordination needs related to Disease Management Educational Needs re: DM II/ HTN/ CKD IV.  Review of patient status, including review of consultants reports, relevant laboratory and other test results, and collaboration with appropriate care team members and the patient's provider was performed as part of comprehensive patient evaluation and provision of care management services.    SDOH (Social Determinants of Health) screening performed today: Physical Activity. See Care Plan for related entries.    Outpatient Encounter Medications as of 08/31/2019  Medication Sig Note  . amLODipine (NORVASC) 10 MG tablet TAKE 1 TABLET BY MOUTH EVERY DAY (Patient taking differently: Take 10 mg by mouth daily. )   . aspirin EC 81 MG tablet Take 1 tablet (81 mg total) by mouth daily.   Marland Kitchen atorvastatin (LIPITOR) 40 MG tablet Take 1 tablet (40 mg total) by mouth daily.   . Blood Glucose Monitoring Suppl (CONTOUR NEXT MONITOR) w/Device KIT 1 Device by Does not apply route 4 (four) times daily - after meals and at bedtime. 08/26/2019: Using [employer] meter  . carvedilol (COREG) 6.25 MG tablet TAKE 1 TABLET (6.25 MG TOTAL) BY MOUTH 2 (TWO) TIMES DAILY WITH A MEAL   . enalapril (VASOTEC) 5 MG tablet TAKE 1 TABLET BY MOUTH EVERY DAY (Patient taking differently: Take 5 mg by mouth daily. )   . furosemide (LASIX) 80 MG tablet Take 1 tablet (80 mg total) by mouth 2 (two) times daily.   Marland Kitchen glucose blood test strip Use as instructed   . hydrALAZINE (APRESOLINE) 25 MG tablet TAKE 1 TABLET BY MOUTH THREE TIMES A DAY   . insulin lispro (HUMALOG KWIKPEN) 100 UNIT/ML KwikPen Inject 0.15-0.25 mLs (15-25 Units total) into the skin 2 (two) times daily  before a meal.   . insulin lispro (HUMALOG) 100 UNIT/ML injection Inject 0-9 Units into skin three times daily with meals.   . Levetiracetam 750 MG TB24 Take 2 tablets (1,500 mg total) by mouth daily.   . Microlet Lancets MISC 1 Device by Does not apply route 3 (three) times daily.   . Semaglutide,0.25 or 0.5MG/DOS, (OZEMPIC, 0.25 OR 0.5 MG/DOSE,) 2 MG/1.5ML SOPN Inject 0.5 mg into the skin once a week.   . zolpidem (AMBIEN) 5 MG tablet Take 1 tablet (5 mg total) by mouth at bedtime as needed for sleep. 08/08/2019: Pt states he hasn't used this med in a while.  . Insulin Pen Needle (B-D ULTRAFINE III SHORT PEN) 31G X 8 MM MISC 1 application by Does not apply route 4 (four) times daily.   [name] Devices (MICROLET NEXT LANCING DEVICE) MISC 1 Device by Does not apply route 3 (three) times daily.   . polyethylene glycol powder (GLYCOLAX/MIRALAX) powder Take 17 g (or 1 tablespoon) daily mixed in water. (Patient not taking: Reported on 08/26/2019)   . sildenafil (VIAGRA) 100 MG tablet Take 0.5-1 tablets (50-100 mg total) by mouth daily as needed for erectile dysfunction. (Patient not taking: Reported on 08/26/2019)    No facility-administered encounter medications on file as of 08/31/2019.     Follow up plan:  The care management team will reach out to the patient again over the next 14 days.  The patient has been provided with contact  information for the care management team and has been advised to call with any health related questions or concerns.   Jeremy [name] was given information about Care Management services today including:  1. Care Management services include personalized support from designated clinical staff supervised by a physician, including individualized plan of care and coordination with other care providers 2. 24/7 contact phone numbers for assistance for urgent and routine care needs. 3. The patient may stop Care Management services at any time (effective at the end of the month) by  phone call to the office staff.  Patient agreed to services and verbal consent obtained.  Jeremy Arms RN, BSN, [facility] Care Management Coordinator [place] Phone: [phone] Fax: [phone]

## 2019-08-31 NOTE — Patient Instructions (Signed)
Visit Information  Goals Addressed            This Visit's Progress   . " I am not sure why my blood sugars stay elevated" (pt-stated)       Current Barriers:  . Chronic Disease Management support, education, and care coordination needs related to HTN, DMII, and CKD Stage IV  Clinical Goal(s) related to HTN, DMII, and CKD Stage IV :  Over the next 30 days, patient will:  . Work with the care management team to address educational, disease management, and care coordination needs  . Begin or continue self health monitoring activities as directed today Measure and record cbg (blood glucose) bid times daily and Measure and record blood pressure 5 times per week . Call care management team with questions or concerns . Verbalize basic understanding of patient centered plan of care established today  Interventions related to HTN, DMII, and CKD Stage IV :  . Evaluation of current treatment plans and patient's adherence to plan as established by provider . Assessed patient understanding of disease states . Assessed patient's education and care coordination needs . Provided disease specific education to patient  . Collaborated with appropriate clinical care team members regarding patient needs, patient will talk with talk with [place] concerning needs with insurance. . Continue light exercise around home . Continue cutting down on consumption of soda . Follow up with pharmacy re: medication assistance  Patient Self Care Activities related to HTN, DMII, and CKD Stage IV :  . Patient is unable to independently self-manage chronic health conditions  Initial goal documentation        Jeremy [name] was given information about Care Management services today including:  1. Care Management services include personalized support from designated clinical staff supervised by his physician, including individualized plan of care and coordination with other care providers 2. 24/7 contact phone numbers for  assistance for urgent and routine care needs. 3. The patient may stop CCM services at any time (effective at the end of the month) by phone call to the office staff.  Patient agreed to services and verbal consent obtained.   The patient verbalized understanding of instructions provided today and declined a print copy of patient instruction materials.   The care management team will reach out to the patient again over the next 14 days.  The patient has been provided with contact information for the care management team and has been advised to call with any health related questions or concerns.   Lazaro Arms RN, BSN, [facility] Care Management Coordinator [place] Phone: [phone] Fax: [phone]

## 2019-08-31 NOTE — Telephone Encounter (Signed)
Patient calls to follow-up on blood glucose readings.    Patient reported readings of 196, 234, 143, 283, 306, 219, 187, 169, 146, 229 and 220.  He shared that the elevated readings occur when he is stressed.  He shared that dialysis makes his stress level increase.    We agreed to make no changes to medications today.  He plans to investigate [facility] as medication supply source this week.  He reports adequate medication supply at this time.

## 2019-09-01 DIAGNOSIS — T8249XA Other complication of vascular dialysis catheter, initial encounter: Secondary | ICD-10-CM | POA: Diagnosis not present

## 2019-09-01 DIAGNOSIS — Z992 Dependence on renal dialysis: Secondary | ICD-10-CM | POA: Diagnosis not present

## 2019-09-01 DIAGNOSIS — N2581 Secondary hyperparathyroidism of renal origin: Secondary | ICD-10-CM | POA: Diagnosis not present

## 2019-09-01 DIAGNOSIS — N186 End stage renal disease: Secondary | ICD-10-CM | POA: Diagnosis not present

## 2019-09-02 ENCOUNTER — Other Ambulatory Visit: Payer: Self-pay

## 2019-09-02 ENCOUNTER — Ambulatory Visit: Payer: BC Managed Care – PPO | Admitting: Licensed Clinical Social Worker

## 2019-09-02 ENCOUNTER — Telehealth: Payer: BC Managed Care – PPO

## 2019-09-02 DIAGNOSIS — Z789 Other specified health status: Secondary | ICD-10-CM

## 2019-09-02 DIAGNOSIS — N184 Chronic kidney disease, stage 4 (severe): Secondary | ICD-10-CM

## 2019-09-02 DIAGNOSIS — Z7189 Other specified counseling: Secondary | ICD-10-CM

## 2019-09-02 NOTE — Chronic Care Management (AMB) (Signed)
   Clinical Social Work  Care Management referral   09/02/2019 Name: Jeremy [name] MRN: [id number] DOB: 05-Nov-1965  [name] is a 54 y.o. year old male who is a primary care patient of Danna Hefty, DO . LCSW was consulted for assistance with [employer] .   LCSW reached out to [name] today by phone to introduce self, assess needs and provide one time brief intervention.  Review of patient status, including review of consultants reports, relevant laboratory and other test results, and collaboration with appropriate care team members and the patient's provider was performed as part of comprehensive patient evaluation and provision of care management services.   Goals Addressed            This Visit's Progress   . Needs community resources       Current Barriers:  . Patient with HTN and CKD Stage IV  needs community resources,  . Acknowledges deficits and needs support, education and care coordination in order to meet this unmet need  . Lacks knowledge of community resource: to assist with rent and utilities . Received last pay check and insurance will end this month Clinical Goal(s)  . Over the next 30 days, patient will contact agencies discussed for community support  Interventions provided by LCSW:  . Assessed patient's needs and discussed ongoing care management follow up  . Provided patient with information and phone number for  Medication Assistance Program [phone], Department of Social Services [phone],  [facility] [phone], [place] [facility] [place] program [phone] . Discussed support system with social worker at Dialysis for Medicare and Medicaid questions  . Collaborated with RN care manager regarding patient needs Patient Self Care Activities & Deficits:  . Patient is unable to independently navigate community resource options without care coordination support  . Acknowledges deficits  and is motivated to resolve concern  . Patient is able to contact the agencies discussed today Initial goal documentation    Plan:   1. Patient declined follow up by LCSW and will contact LCSW if needed 2. No other services needed by LCSW at this time  Cas[place] Lanius, [place] / [place]   [phone] 2:25 PM

## 2019-09-02 NOTE — Patient Instructions (Signed)
Licensed Clinical Social Worker Visit Information Jeremy [name]  it was nice speaking with you. Please call me directly if you have questions [phone] Goals we discussed today:  Goals Addressed            This Visit's Progress   . Needs community resources       Current Barriers:  . Patient with HTN and CKD Stage IV  needs community resources,  . Acknowledges deficits and needs support, education and care coordination in order to meet this unmet need  . Lacks knowledge of community resource: to assist with rent and utilities . Received last pay check and insurance will end this month Clinical Goal(s)  . Over the next 30 days, patient will contact agencies discussed for community support  Interventions provided by LCSW:  . Assessed patient's needs and discussed ongoing care management follow up  . Provided patient with information and phone number for  Medication Assistance Program [phone], Department of Social Services [phone],  [facility] [phone], [place] [facility] [place] program [phone] . Discussed support system with social worker at Dialysis for Medicare and Medicaid questions  . Collaborated with RN care manager regarding patient needs Patient Self Care Activities & Deficits:  . Patient is unable to independently navigate community resource options without care coordination support  . Acknowledges deficits and is motivated to resolve concern  . Patient is able to contact the agencies discussed today Initial goal documentation     Materials provided: Calendar [name] was given information about Care Management services today including:  1. Care Management services include personalized support from designated clinical staff supervised by his physician, including individualized plan of care and coordination with other care providers 2. 24/7 contact [phone] for assistance for urgent and routine care  needs. 3. Care Management services at any time by phone call to the office staff.  Patient did not agree to enrollment in care management services  The patient verbalized understanding of instructions provided today.  Follow up plan: No follow up required by LCSW at time [name], LCSW

## 2019-09-03 DIAGNOSIS — N186 End stage renal disease: Secondary | ICD-10-CM | POA: Diagnosis not present

## 2019-09-03 DIAGNOSIS — Z992 Dependence on renal dialysis: Secondary | ICD-10-CM | POA: Diagnosis not present

## 2019-09-03 DIAGNOSIS — N2581 Secondary hyperparathyroidism of renal origin: Secondary | ICD-10-CM | POA: Diagnosis not present

## 2019-09-03 DIAGNOSIS — T8249XA Other complication of vascular dialysis catheter, initial encounter: Secondary | ICD-10-CM | POA: Diagnosis not present

## 2019-09-06 DIAGNOSIS — N186 End stage renal disease: Secondary | ICD-10-CM | POA: Diagnosis not present

## 2019-09-06 DIAGNOSIS — N2581 Secondary hyperparathyroidism of renal origin: Secondary | ICD-10-CM | POA: Diagnosis not present

## 2019-09-06 DIAGNOSIS — T8249XA Other complication of vascular dialysis catheter, initial encounter: Secondary | ICD-10-CM | POA: Diagnosis not present

## 2019-09-06 DIAGNOSIS — Z992 Dependence on renal dialysis: Secondary | ICD-10-CM | POA: Diagnosis not present

## 2019-09-08 DIAGNOSIS — Z992 Dependence on renal dialysis: Secondary | ICD-10-CM | POA: Diagnosis not present

## 2019-09-08 DIAGNOSIS — N2581 Secondary hyperparathyroidism of renal origin: Secondary | ICD-10-CM | POA: Diagnosis not present

## 2019-09-08 DIAGNOSIS — T8249XA Other complication of vascular dialysis catheter, initial encounter: Secondary | ICD-10-CM | POA: Diagnosis not present

## 2019-09-08 DIAGNOSIS — N186 End stage renal disease: Secondary | ICD-10-CM | POA: Diagnosis not present

## 2019-09-09 ENCOUNTER — Telehealth: Payer: Self-pay | Admitting: Pharmacist

## 2019-09-09 NOTE — Telephone Encounter (Signed)
Called patient on 09/09/2019 at 10:55 AM   Patient is checking BG once daily.   BG readings: 138, 239, 248, 146, 263, 254, 292  Patient continues to administer Humalog based on dosing schedule (usually 15-20 units). He states he has the schedule on his fridge and refers to it prior to administering Humalog. He continues to take Ozempic 0.25 mg subQ weekly (Mondays). He states he does not remember being instructed to increase to Ozempic 0.5 mg subQ weekly.   He states he has contacted [facility] and left a voicemail. He has not received a returned call.  Plan for patient to increase Ozempic from 0.25 mg to 0.5 mg this upcoming Monday (09/13/19). Continue Humalog dosing. Advised patient to check fasting blood glucose daily and 2-hr PP (largest meal - pt confirms is dinner) 2-3x week. Plan for patient to contact [facility] again over the next week.  Follow up in 1 week  Thank you for involving pharmacy to assist in providing this patient's care.   Drexel Iha, PharmD PGY2 Ambulatory Care Pharmacy Resident

## 2019-09-10 DIAGNOSIS — N2581 Secondary hyperparathyroidism of renal origin: Secondary | ICD-10-CM | POA: Diagnosis not present

## 2019-09-10 DIAGNOSIS — Z992 Dependence on renal dialysis: Secondary | ICD-10-CM | POA: Diagnosis not present

## 2019-09-10 DIAGNOSIS — N186 End stage renal disease: Secondary | ICD-10-CM | POA: Diagnosis not present

## 2019-09-10 DIAGNOSIS — T8249XA Other complication of vascular dialysis catheter, initial encounter: Secondary | ICD-10-CM | POA: Diagnosis not present

## 2019-09-13 DIAGNOSIS — N2581 Secondary hyperparathyroidism of renal origin: Secondary | ICD-10-CM | POA: Diagnosis not present

## 2019-09-13 DIAGNOSIS — T8249XA Other complication of vascular dialysis catheter, initial encounter: Secondary | ICD-10-CM | POA: Diagnosis not present

## 2019-09-13 DIAGNOSIS — Z992 Dependence on renal dialysis: Secondary | ICD-10-CM | POA: Diagnosis not present

## 2019-09-13 DIAGNOSIS — N186 End stage renal disease: Secondary | ICD-10-CM | POA: Diagnosis not present

## 2019-09-14 ENCOUNTER — Ambulatory Visit: Payer: BC Managed Care – PPO

## 2019-09-15 DIAGNOSIS — T8249XA Other complication of vascular dialysis catheter, initial encounter: Secondary | ICD-10-CM | POA: Diagnosis not present

## 2019-09-15 DIAGNOSIS — N2581 Secondary hyperparathyroidism of renal origin: Secondary | ICD-10-CM | POA: Diagnosis not present

## 2019-09-15 DIAGNOSIS — Z992 Dependence on renal dialysis: Secondary | ICD-10-CM | POA: Diagnosis not present

## 2019-09-15 DIAGNOSIS — N186 End stage renal disease: Secondary | ICD-10-CM | POA: Diagnosis not present

## 2019-09-16 ENCOUNTER — Telehealth: Payer: Self-pay | Admitting: Pharmacist

## 2019-09-16 DIAGNOSIS — E1165 Type 2 diabetes mellitus with hyperglycemia: Secondary | ICD-10-CM

## 2019-09-16 DIAGNOSIS — IMO0002 Reserved for concepts with insufficient information to code with codable children: Secondary | ICD-10-CM

## 2019-09-16 DIAGNOSIS — E1121 Type 2 diabetes mellitus with diabetic nephropathy: Secondary | ICD-10-CM

## 2019-09-16 MED ORDER — OZEMPIC (0.25 OR 0.5 MG/DOSE) 2 MG/1.5ML ~~LOC~~ SOPN: 0.3750 mg | PEN_INJECTOR | SUBCUTANEOUS | 0 refills | Status: DC

## 2019-09-16 MED ORDER — INSULIN LISPRO (1 UNIT DIAL) 100 UNIT/ML (KWIKPEN): 10.0000 [IU] | PEN_INJECTOR | Freq: Two times a day (BID) | SUBCUTANEOUS | Status: DC

## 2019-09-16 NOTE — Assessment & Plan Note (Signed)
Call to patient to evaluate blood glucose control.   Patient's girlfriend shared the following numbers: 155,  169 130,  121 119,  183 154,  151 125,  125 101,  148 154,  204 95  Denies Hypoglycemia  Patient states he is currently taking Ozempic (semaglutide) 0.25 mg weekly on Monday.  He was scared to increase dose due to low Blood Glucose readings.   Intermittent use of Humalog dosing 15-20 units 2-3 times per day with meals (not all meals).   After discussion, we agreed to increase Ozempic by 5 clicks (0.25mg  plus 5 clicks) and use lower dose of meal time insulin 10-15 units with goal of using no Humalog in the near future.   Supply of Ozempic in hand  (> 2 months).

## 2019-09-16 NOTE — Telephone Encounter (Signed)
Call to patient to evaluate blood glucose control.   Patient's girlfriend shared the following numbers: 155,  169 130,  121 119,  183 154,  151 125,  125 101,  148 154,  204 95  Denies Hypoglycemia  Patient states he is currently taking Ozempic (semaglutide) 0.25 mg weekly on Monday.  He was scared to increase dose due to low Blood Glucose readings.   Intermittent use of Humalog dosing 15-20 units 2-3 times per day with meals (not all meals).   After discussion, we agreed to increase Ozempic by 5 clicks (0.25mg  plus 5 clicks) and use lower dose of meal time insulin 10-15 units with goal of using no Humalog in the near future.   Supply of Ozempic in hand  (> 2 months).   Phone F/U planned in 2 weeks.

## 2019-09-16 NOTE — Telephone Encounter (Signed)
Noted and agree. 

## 2019-09-17 DIAGNOSIS — N2581 Secondary hyperparathyroidism of renal origin: Secondary | ICD-10-CM | POA: Diagnosis not present

## 2019-09-17 DIAGNOSIS — T8249XA Other complication of vascular dialysis catheter, initial encounter: Secondary | ICD-10-CM | POA: Diagnosis not present

## 2019-09-17 DIAGNOSIS — Z992 Dependence on renal dialysis: Secondary | ICD-10-CM | POA: Diagnosis not present

## 2019-09-17 DIAGNOSIS — N186 End stage renal disease: Secondary | ICD-10-CM | POA: Diagnosis not present

## 2019-09-19 DIAGNOSIS — Z992 Dependence on renal dialysis: Secondary | ICD-10-CM | POA: Diagnosis not present

## 2019-09-19 DIAGNOSIS — I129 Hypertensive chronic kidney disease with stage 1 through stage 4 chronic kidney disease, or unspecified chronic kidney disease: Secondary | ICD-10-CM | POA: Diagnosis not present

## 2019-09-19 DIAGNOSIS — N186 End stage renal disease: Secondary | ICD-10-CM | POA: Diagnosis not present

## 2019-09-20 DIAGNOSIS — N2581 Secondary hyperparathyroidism of renal origin: Secondary | ICD-10-CM | POA: Diagnosis not present

## 2019-09-20 DIAGNOSIS — Z992 Dependence on renal dialysis: Secondary | ICD-10-CM | POA: Diagnosis not present

## 2019-09-20 DIAGNOSIS — N186 End stage renal disease: Secondary | ICD-10-CM | POA: Diagnosis not present

## 2019-09-20 DIAGNOSIS — T8249XA Other complication of vascular dialysis catheter, initial encounter: Secondary | ICD-10-CM | POA: Diagnosis not present

## 2019-09-22 DIAGNOSIS — N2581 Secondary hyperparathyroidism of renal origin: Secondary | ICD-10-CM | POA: Diagnosis not present

## 2019-09-22 DIAGNOSIS — N186 End stage renal disease: Secondary | ICD-10-CM | POA: Diagnosis not present

## 2019-09-22 DIAGNOSIS — T8249XA Other complication of vascular dialysis catheter, initial encounter: Secondary | ICD-10-CM | POA: Diagnosis not present

## 2019-09-22 DIAGNOSIS — Z992 Dependence on renal dialysis: Secondary | ICD-10-CM | POA: Diagnosis not present

## 2019-09-22 NOTE — Chronic Care Management (AMB) (Signed)
  Care Management   Follow Up Note   09/22/2019 Name: Jeremy [name] MRN: [id number] DOB: Nov 03, 1965  Referred by: Jeremy Hefty, DO Reason for referral : Care Coordination (Care Management RNCM DM II/CKD IV/HTN F/U)   Jeremy [name] is a 54 y.o. year old male who is a primary care patient of Jeremy Hefty, DO. The care management team was consulted for assistance with care management and care coordination needs.    Review of patient status, including review of consultants reports, relevant laboratory and other test results, and collaboration with appropriate care team members and the patient's provider was performed as part of comprehensive patient evaluation and provision of chronic care management services.    SDOH (Social Determinants of Health) assessments performed: No See Care Plan activities for detailed interventions related to [facility]     Advanced Directives: See Care Plan and Vynca application for related entries.   Goals Addressed            This Visit's Progress   . " I am not sure why my blood sugars stay elevated" (pt-stated)       Current Barriers:  . Chronic Disease Management support, education, and care coordination needs related to HTN, DMII, and CKD Stage IV  Clinical Goal(s) related to HTN, DMII, and CKD Stage IV :  Over the next 30 days, patient will:  . Work with the care management team to address educational, disease management, and care coordination needs  . Begin or continue self health monitoring activities as directed today Measure and record cbg (blood glucose) bid times daily and Measure and record blood pressure 5 times per week . Call care management team with questions or concerns . Verbalize basic understanding of patient centered plan of care established today  Interventions related to HTN, DMII, and CKD Stage IV :  . Evaluation of current treatment plans and patient's adherence to plan as established by provider . Assessed patient  understanding of disease states . Assessed patient's education and care coordination needs . Provided disease specific education to patient  . Collaborated with appropriate clinical care team members regarding patient needs, patient will talk with talk with Sw concerning needs with insurance. . Continue light exercise around home . Continue cutting down on consumption of soda . Follow up with pharmacy re: medication assistance  . 09/14/19 . Spoke with the patient and he states that he is doing well.  He is continuing to check his blood sugars daily.  He states that his highest has been 24  but he knows why his blood sugar are that high.  He was satisfying a craving.  . Discussed with the patient about talking with the nutritionist he has access to at the dialysis center.  Discuss foods that are good for both diabetes and renal diet. Marland Kitchen He states that he is still working with pharmacy regarding his medication assistance . He has an appointment with Dr. Clabe [name] on 7/19 in the office . He is still currently working to receive his disability . He has CCM number to call with any questions or concerns  Patient Self Care Activities related to HTN, DMII, and CKD Stage IV :  . Patient is unable to independently self-manage chronic health conditions  Initial goal documentation         No further follow up required: from RN Case Manager at this time.  Jeremy Arms RN, BSN, [facility] Care Management Coordinator [place] Phone: [phone] Fax: [phone]

## 2019-09-22 NOTE — Patient Instructions (Signed)
Visit Information  Goals Addressed            This Visit's Progress   . " I am not sure why my blood sugars stay elevated" (pt-stated)       Current Barriers:  . Chronic Disease Management support, education, and care coordination needs related to HTN, DMII, and CKD Stage IV  Clinical Goal(s) related to HTN, DMII, and CKD Stage IV :  Over the next 30 days, patient will:  . Work with the care management team to address educational, disease management, and care coordination needs  . Begin or continue self health monitoring activities as directed today Measure and record cbg (blood glucose) bid times daily and Measure and record blood pressure 5 times per week . Call care management team with questions or concerns . Verbalize basic understanding of patient centered plan of care established today  Interventions related to HTN, DMII, and CKD Stage IV :  . Evaluation of current treatment plans and patient's adherence to plan as established by provider . Assessed patient understanding of disease states . Assessed patient's education and care coordination needs . Provided disease specific education to patient  . Collaborated with appropriate clinical care team members regarding patient needs, patient will talk with talk with Sw concerning needs with insurance. . Continue light exercise around home . Continue cutting down on consumption of soda . Follow up with pharmacy re: medication assistance  . 09/14/19 . Spoke with the patient and he states that he is doing well.  He is continuing to check his blood sugars daily.  He states that his highest has been 40  but he knows why his blood sugar are that high.  He was satisfying a craving.  . Discussed with the patient about talking with the nutritionist he has access to at the dialysis center.  Discuss foods that are good for both diabetes and renal diet. Marland Kitchen He states that he is still working with pharmacy regarding his medication assistance . He  has an appointment with Dr. Clabe Seal on 7/19 in the office . He is still currently working to receive his disability . He has CCM number to call with any questions or concerns  Patient Self Care Activities related to HTN, DMII, and CKD Stage IV :  . Patient is unable to independently self-manage chronic health conditions  Initial goal documentation        Mr. Anschutz was given information about Care Management services today including:  1. Care Management services include personalized support from designated clinical staff supervised by his physician, including individualized plan of care and coordination with other care providers 2. 24/7 contact phone numbers for assistance for urgent and routine care needs. 3. The patient may stop CCM services at any time (effective at the end of the month) by phone call to the office staff.  Patient agreed to services and verbal consent obtained.   The patient verbalized understanding of instructions provided today and declined a print copy of patient instruction materials.   No further follow up required: From RN Case Manager at this time.  Lazaro Arms RN, BSN, [facility] Care Management Coordinator [place] Phone: [phone] Fax: [phone]

## 2019-09-24 DIAGNOSIS — N186 End stage renal disease: Secondary | ICD-10-CM | POA: Diagnosis not present

## 2019-09-24 DIAGNOSIS — T8249XA Other complication of vascular dialysis catheter, initial encounter: Secondary | ICD-10-CM | POA: Diagnosis not present

## 2019-09-24 DIAGNOSIS — Z992 Dependence on renal dialysis: Secondary | ICD-10-CM | POA: Diagnosis not present

## 2019-09-24 DIAGNOSIS — N2581 Secondary hyperparathyroidism of renal origin: Secondary | ICD-10-CM | POA: Diagnosis not present

## 2019-09-27 DIAGNOSIS — T8249XA Other complication of vascular dialysis catheter, initial encounter: Secondary | ICD-10-CM | POA: Diagnosis not present

## 2019-09-27 DIAGNOSIS — N186 End stage renal disease: Secondary | ICD-10-CM | POA: Diagnosis not present

## 2019-09-27 DIAGNOSIS — Z992 Dependence on renal dialysis: Secondary | ICD-10-CM | POA: Diagnosis not present

## 2019-09-27 DIAGNOSIS — N2581 Secondary hyperparathyroidism of renal origin: Secondary | ICD-10-CM | POA: Diagnosis not present

## 2019-09-29 DIAGNOSIS — N2581 Secondary hyperparathyroidism of renal origin: Secondary | ICD-10-CM | POA: Diagnosis not present

## 2019-09-29 DIAGNOSIS — T8249XA Other complication of vascular dialysis catheter, initial encounter: Secondary | ICD-10-CM | POA: Diagnosis not present

## 2019-09-29 DIAGNOSIS — N186 End stage renal disease: Secondary | ICD-10-CM | POA: Diagnosis not present

## 2019-09-29 DIAGNOSIS — Z992 Dependence on renal dialysis: Secondary | ICD-10-CM | POA: Diagnosis not present

## 2019-10-01 DIAGNOSIS — Z992 Dependence on renal dialysis: Secondary | ICD-10-CM | POA: Diagnosis not present

## 2019-10-01 DIAGNOSIS — T8249XA Other complication of vascular dialysis catheter, initial encounter: Secondary | ICD-10-CM | POA: Diagnosis not present

## 2019-10-01 DIAGNOSIS — N186 End stage renal disease: Secondary | ICD-10-CM | POA: Diagnosis not present

## 2019-10-01 DIAGNOSIS — N2581 Secondary hyperparathyroidism of renal origin: Secondary | ICD-10-CM | POA: Diagnosis not present

## 2019-10-01 NOTE — Telephone Encounter (Signed)
Called patient on 10/01/2019 at 12:16 PM   Patient states he has been trouble sleeping, which he attributes to higher blood sugar readings. He states when he has trouble sleeping he will eat grapes/bananas which he knows spikes his BG.  Date FBG PPBG 3/11 122, 182 3/10 136, 194 3/8   154,  3/7   120,  3/6   146, 109 3/5   186 3/4   132 3/3   148,  3/2   129, 150 3/1   135, 183 2/28 123, 165  Patient reports he thought his BG readings were elevated due to insomnia, issues however once reviewing BG log it appears BG readings are within range. He states he is taking Humalog 15-20 units (usually twice daily) and Ozempic 1.61 + 5 clicks (Mondays) (did not decrease Humalog dose per Dr. Graylin Shiver instructions at last follow up call). BG readings appear to be doing well. Will increase Ozempic and decrease Humalog with the plan of titrating Ozempic to max dose and discontinuing Humalog in the future. Advised patient to increase Ozempic to 0.5 mg weekly and to decrease Humalog to 10-15 units with meals. Patient wrote down dose instructions and verbalized understanding.  Patient still has not received call back from [facility]. Advised patient to attempt to contact [facility] again. He is concerned because he states he is going to run out of PO medications soon and cannot afford refills. Will try to reach pharmacist/staff member at [facility] to follow up on patient's [facility] enrollment status.  Thank you for involving pharmacy to assist in providing this patient's care.   Drexel Iha, PharmD PGY2 Ambulatory Care Pharmacy Resident

## 2019-10-04 DIAGNOSIS — N2581 Secondary hyperparathyroidism of renal origin: Secondary | ICD-10-CM | POA: Diagnosis not present

## 2019-10-04 DIAGNOSIS — Z992 Dependence on renal dialysis: Secondary | ICD-10-CM | POA: Diagnosis not present

## 2019-10-04 DIAGNOSIS — N186 End stage renal disease: Secondary | ICD-10-CM | POA: Diagnosis not present

## 2019-10-04 DIAGNOSIS — T8249XA Other complication of vascular dialysis catheter, initial encounter: Secondary | ICD-10-CM | POA: Diagnosis not present

## 2019-10-06 DIAGNOSIS — N2581 Secondary hyperparathyroidism of renal origin: Secondary | ICD-10-CM | POA: Diagnosis not present

## 2019-10-06 DIAGNOSIS — N186 End stage renal disease: Secondary | ICD-10-CM | POA: Diagnosis not present

## 2019-10-06 DIAGNOSIS — Z992 Dependence on renal dialysis: Secondary | ICD-10-CM | POA: Diagnosis not present

## 2019-10-06 DIAGNOSIS — T8249XA Other complication of vascular dialysis catheter, initial encounter: Secondary | ICD-10-CM | POA: Diagnosis not present

## 2019-10-07 ENCOUNTER — Telehealth: Payer: Self-pay | Admitting: Pharmacist

## 2019-10-07 DIAGNOSIS — E1169 Type 2 diabetes mellitus with other specified complication: Secondary | ICD-10-CM

## 2019-10-07 DIAGNOSIS — I10 Essential (primary) hypertension: Secondary | ICD-10-CM

## 2019-10-07 NOTE — Telephone Encounter (Signed)
Called patient on 10/07/2019 at 11:12 AM   Patient has been scheduled for an appt with [place] on 10/26/2019. Patient is nervous he will run out of oral medications prior to appt - will contact Dr. Acquanetta Belling Ausdall regarding this issue.   Patient states his BG readings are within 100-200 mg/dL range. He has an adequate supply of DM medications (Ozempic, Humalog) and supplies. Asked patient if he would like me to further dose adjust DM medications in the interim time period until his appt on 4/6 at [facility] (with the goal to titrate up Ozempic dose and discontinue Humalog dose) - patient reports he would prefer to continue DM medication regimen as prescribed for now since BG readings are controlled. He would prefer to have further DM medication adjustments when he follows with a provider at [place]. Advised patient to contact a pharmacist at [facility] (Dr. Valentina Lucks or myself) if he has any additional concerns regarding DM management in the interim timeframe until next office visit at [facility] or if he needs additional DM medication samples. Patient verbalized understanding.   Thank you for involving pharmacy to assist in providing this patient's care.   Drexel Iha, PharmD PGY2 Ambulatory Care Pharmacy Resident

## 2019-10-08 DIAGNOSIS — Z992 Dependence on renal dialysis: Secondary | ICD-10-CM | POA: Diagnosis not present

## 2019-10-08 DIAGNOSIS — N2581 Secondary hyperparathyroidism of renal origin: Secondary | ICD-10-CM | POA: Diagnosis not present

## 2019-10-08 DIAGNOSIS — Z23 Encounter for immunization: Secondary | ICD-10-CM | POA: Diagnosis not present

## 2019-10-08 DIAGNOSIS — N186 End stage renal disease: Secondary | ICD-10-CM | POA: Diagnosis not present

## 2019-10-08 DIAGNOSIS — T8249XA Other complication of vascular dialysis catheter, initial encounter: Secondary | ICD-10-CM | POA: Diagnosis not present

## 2019-10-08 MED ORDER — LEVETIRACETAM ER 750 MG PO TB24: 1500.0000 mg | ORAL_TABLET | Freq: Every day | ORAL | 0 refills | Status: DC

## 2019-10-08 MED ORDER — ENALAPRIL MALEATE 5 MG PO TABS: 5.0000 mg | ORAL_TABLET | Freq: Every day | ORAL | 0 refills | Status: DC

## 2019-10-08 MED ORDER — AMLODIPINE BESYLATE 10 MG PO TABS: 10.0000 mg | ORAL_TABLET | Freq: Every day | ORAL | 0 refills | Status: DC

## 2019-10-08 MED ORDER — ATORVASTATIN CALCIUM 40 MG PO TABS: 40.0000 mg | ORAL_TABLET | Freq: Every day | ORAL | 0 refills | Status: DC

## 2019-10-08 MED ORDER — CARVEDILOL 6.25 MG PO TABS: 6.2500 mg | ORAL_TABLET | Freq: Two times a day (BID) | ORAL | 0 refills | Status: DC

## 2019-10-08 MED ORDER — FUROSEMIDE 80 MG PO TABS: 80.0000 mg | ORAL_TABLET | Freq: Two times a day (BID) | ORAL | 0 refills | Status: DC

## 2019-10-08 MED ORDER — HYDRALAZINE HCL 25 MG PO TABS: 25.0000 mg | ORAL_TABLET | Freq: Three times a day (TID) | ORAL | 0 refills | Status: DC

## 2019-10-08 MED FILL — ATORVASTATIN CALCIUM 40 MG: 40 | 30 days supply | Qty: 30 | Fill #0

## 2019-10-08 MED FILL — LEVETIRACETAM ER 750 MG TAB: 750 | 30 days supply | Qty: 60 | Fill #0

## 2019-10-08 MED FILL — ENALAPRIL MALEATE 5 MG TAB: 5 | 30 days supply | Qty: 30 | Fill #0

## 2019-10-08 MED FILL — hydrALAZINE HCL 25 MG TABS: 25 | 30 days supply | Qty: 90 | Fill #0

## 2019-10-08 MED FILL — AMLODIPINE BESYLATE 10 MG T: 10 | 30 days supply | Qty: 30 | Fill #0

## 2019-10-08 MED FILL — FUROSEMIDE 80 MG TAB: 80 | 30 days supply | Qty: 60 | Fill #0

## 2019-10-08 MED FILL — CARVEDILOL 6.25 MG TABLET: 6.25 | 30 days supply | Qty: 60 | Fill #0

## 2019-10-08 NOTE — Addendum Note (Signed)
Addended by: Ellwood Handler on: 10/08/2019 10:01 AM   Modules accepted: Orders

## 2019-10-08 NOTE — Telephone Encounter (Signed)
Spoke with pharmacist, Dr. Benard Halsted, who confirmed he is able to fill oral prescriptions for patient and utilize a one-time free fill since he is uninsured at [place]. Sent all oral medications over as 30-day supplies with no refills.  Notified patient who confirmed the prescriptions sent in were all the prescriptions he needs. Patient stated he would pick up prescriptions later today. He expressed appreciation for all of the help. Advised patient to follow up with pharmacy team at [facility] if he needs anything else. Patient verbalized understanding.

## 2019-10-11 DIAGNOSIS — T8249XA Other complication of vascular dialysis catheter, initial encounter: Secondary | ICD-10-CM | POA: Diagnosis not present

## 2019-10-11 DIAGNOSIS — N2581 Secondary hyperparathyroidism of renal origin: Secondary | ICD-10-CM | POA: Diagnosis not present

## 2019-10-11 DIAGNOSIS — N186 End stage renal disease: Secondary | ICD-10-CM | POA: Diagnosis not present

## 2019-10-11 DIAGNOSIS — Z992 Dependence on renal dialysis: Secondary | ICD-10-CM | POA: Diagnosis not present

## 2019-10-13 DIAGNOSIS — N2581 Secondary hyperparathyroidism of renal origin: Secondary | ICD-10-CM | POA: Diagnosis not present

## 2019-10-13 DIAGNOSIS — T8249XA Other complication of vascular dialysis catheter, initial encounter: Secondary | ICD-10-CM | POA: Diagnosis not present

## 2019-10-13 DIAGNOSIS — Z992 Dependence on renal dialysis: Secondary | ICD-10-CM | POA: Diagnosis not present

## 2019-10-13 DIAGNOSIS — N186 End stage renal disease: Secondary | ICD-10-CM | POA: Diagnosis not present

## 2019-10-15 DIAGNOSIS — N186 End stage renal disease: Secondary | ICD-10-CM | POA: Diagnosis not present

## 2019-10-15 DIAGNOSIS — N2581 Secondary hyperparathyroidism of renal origin: Secondary | ICD-10-CM | POA: Diagnosis not present

## 2019-10-15 DIAGNOSIS — Z992 Dependence on renal dialysis: Secondary | ICD-10-CM | POA: Diagnosis not present

## 2019-10-15 DIAGNOSIS — T8249XA Other complication of vascular dialysis catheter, initial encounter: Secondary | ICD-10-CM | POA: Diagnosis not present

## 2019-10-18 DIAGNOSIS — Z992 Dependence on renal dialysis: Secondary | ICD-10-CM | POA: Diagnosis not present

## 2019-10-18 DIAGNOSIS — N2581 Secondary hyperparathyroidism of renal origin: Secondary | ICD-10-CM | POA: Diagnosis not present

## 2019-10-18 DIAGNOSIS — N186 End stage renal disease: Secondary | ICD-10-CM | POA: Diagnosis not present

## 2019-10-18 DIAGNOSIS — T8249XA Other complication of vascular dialysis catheter, initial encounter: Secondary | ICD-10-CM | POA: Diagnosis not present

## 2019-10-20 DIAGNOSIS — Z992 Dependence on renal dialysis: Secondary | ICD-10-CM | POA: Diagnosis not present

## 2019-10-20 DIAGNOSIS — T8249XA Other complication of vascular dialysis catheter, initial encounter: Secondary | ICD-10-CM | POA: Diagnosis not present

## 2019-10-20 DIAGNOSIS — N2581 Secondary hyperparathyroidism of renal origin: Secondary | ICD-10-CM | POA: Diagnosis not present

## 2019-10-20 DIAGNOSIS — N186 End stage renal disease: Secondary | ICD-10-CM | POA: Diagnosis not present

## 2019-10-20 DIAGNOSIS — I129 Hypertensive chronic kidney disease with stage 1 through stage 4 chronic kidney disease, or unspecified chronic kidney disease: Secondary | ICD-10-CM | POA: Diagnosis not present

## 2019-10-22 DIAGNOSIS — N186 End stage renal disease: Secondary | ICD-10-CM | POA: Diagnosis not present

## 2019-10-22 DIAGNOSIS — T8249XA Other complication of vascular dialysis catheter, initial encounter: Secondary | ICD-10-CM | POA: Diagnosis not present

## 2019-10-22 DIAGNOSIS — Z992 Dependence on renal dialysis: Secondary | ICD-10-CM | POA: Diagnosis not present

## 2019-10-22 DIAGNOSIS — N2581 Secondary hyperparathyroidism of renal origin: Secondary | ICD-10-CM | POA: Diagnosis not present

## 2019-10-25 DIAGNOSIS — Z992 Dependence on renal dialysis: Secondary | ICD-10-CM | POA: Diagnosis not present

## 2019-10-25 DIAGNOSIS — N186 End stage renal disease: Secondary | ICD-10-CM | POA: Diagnosis not present

## 2019-10-25 DIAGNOSIS — N2581 Secondary hyperparathyroidism of renal origin: Secondary | ICD-10-CM | POA: Diagnosis not present

## 2019-10-25 DIAGNOSIS — T8249XA Other complication of vascular dialysis catheter, initial encounter: Secondary | ICD-10-CM | POA: Diagnosis not present

## 2019-10-26 ENCOUNTER — Other Ambulatory Visit: Payer: Self-pay

## 2019-10-26 ENCOUNTER — Encounter: Payer: Self-pay | Admitting: Nurse Practitioner

## 2019-10-26 ENCOUNTER — Ambulatory Visit: Payer: Self-pay | Attending: Nurse Practitioner | Admitting: Nurse Practitioner

## 2019-10-26 DIAGNOSIS — E785 Hyperlipidemia, unspecified: Secondary | ICD-10-CM

## 2019-10-26 DIAGNOSIS — Z7689 Persons encountering health services in other specified circumstances: Secondary | ICD-10-CM

## 2019-10-26 DIAGNOSIS — Z794 Long term (current) use of insulin: Secondary | ICD-10-CM

## 2019-10-26 DIAGNOSIS — Z1211 Encounter for screening for malignant neoplasm of colon: Secondary | ICD-10-CM

## 2019-10-26 DIAGNOSIS — I10 Essential (primary) hypertension: Secondary | ICD-10-CM

## 2019-10-26 DIAGNOSIS — Z125 Encounter for screening for malignant neoplasm of prostate: Secondary | ICD-10-CM

## 2019-10-26 DIAGNOSIS — N184 Chronic kidney disease, stage 4 (severe): Secondary | ICD-10-CM

## 2019-10-26 DIAGNOSIS — E1121 Type 2 diabetes mellitus with diabetic nephropathy: Secondary | ICD-10-CM

## 2019-10-26 DIAGNOSIS — E1165 Type 2 diabetes mellitus with hyperglycemia: Secondary | ICD-10-CM

## 2019-10-26 DIAGNOSIS — IMO0002 Reserved for concepts with insufficient information to code with codable children: Secondary | ICD-10-CM

## 2019-10-26 NOTE — Progress Notes (Signed)
Virtual Visit via Telephone Note Due to national recommendations of social distancing due to Jeremy [name], telehealth visit is felt to be most appropriate for this patient at this time.  I discussed the limitations, risks, security and privacy concerns of performing an evaluation and management service by telephone and the availability of in person appointments. I also discussed with the patient that there may be a patient responsible charge related to this service. The patient expressed understanding and agreed to proceed.    I connected with Jeremy [name] on 10/26/19  at   8:50 AM EDT  EDT by telephone and verified that I am speaking with the correct person using two identifiers.   Consent I discussed the limitations, risks, security and privacy concerns of performing an evaluation and management service by telephone and the availability of in person appointments. I also discussed with the patient that there may be a patient responsible charge related to this service. The patient expressed understanding and agreed to proceed.   Location of Patient: Private Residence   Location of Provider: [place] and [employer] Office    Persons participating in Telemedicine visit: Jeremy Rankins FNP-BC [place]    History of Present Illness: Telemedicine visit for: Establish Care Was previously seeing a provider at [facility] but did not feel like his needs were being met so he decided to come to [facility]  has a past medical history of Abscess, AKI (acute kidney injury) ([place]) (08/05/2017), Bell's palsy, CKD (chronic kidney disease), High cholesterol, Hypertension, Left shoulder pain (10/26/2013), Renal insufficiency (02/14/2014), Seizures ([place]) (08/04/2017), and Type II diabetes mellitus ([place]).   CKD stage IV. Dialysis M-W.F  Essential Hypertension Does not monitor his blood pressure at home. States  "they always check during dialysis". Today's BP 144/82. Currently awaiting AVF  placement. Endorses medication compliance taking enalapril 5 mg daily, carvedilol 6.25 mg BID, amlodipine 10 mg and hydralazine 25 mg TID.  Denies chest pain, shortness of breath, palpitations, lightheadedness, dizziness, headaches or BLE edema.  BP Readings from Last 3 Encounters:  08/26/19 140/74  08/13/19 (!) 159/78  07/27/19 (!) 150/82    DM TYPE Diagnosed "about 15 years ago". Monitors his blood glucose levels BID: Fasting average 170s. Postprandial 150-170. SSI 20-22 units Twice a day BID. Ozempic 0.375 mg weekly. His SSI (humalog) was adjusted at has last office visit with the pharmacist on 08-26-2019 and ozempic was increased to 0.5 mg weekly which he is currently not taking. On STATIN and ACE. Overdue for eye exam. Denies any symptoms of hypoglycemia. Breaks out in sweats while he is eating. Not related to eating any certain foods. Chronic and ongoing for a few years. States his mother and brother had the same condition. May need to be evaluated for gastroparesis.   Lab Results  Component Value Date   HGBA1C 9.0 (H) 08/08/2019   Dyslipidemia LDL not at goal of <70. He is not medication adherent with atorvastatin 40 mg.  Lab Results  Component Value Date   LDLCALC 129 (H) 04/03/2018   Seizures Seeing [facility]  for seizures. Taking Keppra 1000 mg Daily (ordered at twice per day). Denies any recent seizure activity. Not on any driving restrictions.   Past Medical History:  Diagnosis Date  . Abscess   . AKI (acute kidney injury) ([place]) 08/05/2017  . Bell's palsy   . CKD (chronic kidney disease)    Dr. McDiarmind   . High cholesterol   . Hypertension   . Left shoulder pain 10/26/2013  S/p injection on 10/26/13   . Renal insufficiency 02/14/2014  . Seizures ([place]) 08/04/2017   "related to high blood sugars" (08/05/2017)  . Type II diabetes mellitus ([place])     Past Surgical History:  Procedure Laterality Date  . Eyelid surgery Right   . I & D EXTREMITY  11/11/2011   Procedure:  IRRIGATION AND DEBRIDEMENT EXTREMITY;  Surgeon: Jeremy Roof, MD;  Location: [place];  Service: General;  Laterality: Right;  I&D Right Thigh Abscess  . IR FLUORO GUIDE CV LINE RIGHT  08/12/2019  . IR US GUIDE VASC ACCESS RIGHT  08/12/2019    Family History  Problem Relation Age of Onset  . Diabetes Mother   . Heart disease Mother 39  . Diabetes Sister   . Diabetes Brother   . Diabetes Brother   . Cancer Maternal Uncle        type unknown  . Stroke Neg Hx     Social History   Socioeconomic History  . Marital status: Divorced    Spouse name: Not on file  . Number of children: 3  . Years of education: Not on file  . Highest education level: Not on file  Occupational History  . Occupation: maintenance tech  Tobacco Use  . Smoking status: Never Smoker  . Smokeless tobacco: Never Used  Substance and Sexual Activity  . Alcohol use: Not Currently    Comment: rarely-once or twice a year  . Drug use: No  . Sexual activity: Yes  Other Topics Concern  . 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  Did not graduate high school.  Has 3 adult children in [place].  Divorced.  Lives with girlfriend Jeremy [name]).                   Social Determinants of Health   Financial Resource Strain:   . Difficulty of Paying Living Expenses:   Food Insecurity:   . Worried About Charity fundraiser in the Last Year:   . Arboriculturist in the Last Year:   Transportation Needs:   . Film/video editor (Medical):   Marland Kitchen Lack of Transportation (Non-Medical):   Physical Activity: Unknown  . Days of Exercise per Week: 2 days  . Minutes of Exercise per Session: Not on file  Stress:   . Feeling of Stress :   Social Connections:   . Frequency of Communication with Friends and Family:   . Frequency of Social Gatherings with Friends and Family:   . Attends Religious Services:   . Active Member of Clubs or Organizations:   .  Attends Archivist Meetings:   Marland Kitchen Marital Status:      Observations/Objective: Awake, alert and oriented x 3   Review of Systems  Constitutional: Negative for fever, malaise/fatigue and weight loss.  HENT: Negative.  Negative for nosebleeds.   Eyes: Negative.  Negative for blurred vision, double vision and photophobia.  Respiratory: Negative.  Negative for cough and shortness of breath.   Cardiovascular: Negative.  Negative for chest pain, palpitations and leg swelling.  Gastrointestinal: Negative.  Negative for heartburn, nausea and vomiting.  Musculoskeletal: Negative.  Negative for myalgias.  Neurological: Negative.  Negative for dizziness, focal weakness, seizures and headaches.  Psychiatric/Behavioral: Negative.  Negative for suicidal ideas.    Assessment and Plan: Kamarri was seen today for establish care.  Diagnoses and all orders for this visit:  Encounter to establish care  Uncontrolled type 2 diabetes mellitus with diabetic nephropathy, with long-term current use of insulin ([place]) -     Ambulatory referral to Ophthalmology -     Hemoglobin A1c; Future  Resistant hypertension -     CMP14+EGFR; Future  Dyslipidemia, goal LDL below 70 -     Lipid panel; Future  Colon cancer screening -     Ambulatory referral to Gastroenterology -     Fecal occult blood, imunochemical([facility]); Future  Chronic kidney disease (CKD), stage IV (severe) (HCC) -     CBC; Future  Prostate cancer screening -     PSA; Future  Other orders -     hydrALAZINE (APRESOLINE) 25 MG tablet; Take 1 tablet (25 mg total) by mouth 3 (three) times daily.     Follow Up Instructions Return in about 3 months (around 01/25/2020).     I discussed the assessment and treatment plan with the patient. The patient was provided an opportunity to ask questions and all were answered. The patient agreed with the plan and demonstrated an understanding of the instructions.   The patient was  advised to call back or seek an in-person evaluation if the symptoms worsen or if the condition fails to improve as anticipated.  I provided 20 minutes of non-face-to-face time during this encounter including median intraservice time, reviewing previous notes, labs, imaging, medications and explaining diagnosis and management.  Gildardo Pounds, FNP-BC

## 2019-10-27 ENCOUNTER — Other Ambulatory Visit: Payer: Self-pay

## 2019-10-27 DIAGNOSIS — N2581 Secondary hyperparathyroidism of renal origin: Secondary | ICD-10-CM | POA: Diagnosis not present

## 2019-10-27 DIAGNOSIS — T8249XA Other complication of vascular dialysis catheter, initial encounter: Secondary | ICD-10-CM | POA: Diagnosis not present

## 2019-10-27 DIAGNOSIS — N186 End stage renal disease: Secondary | ICD-10-CM | POA: Diagnosis not present

## 2019-10-27 DIAGNOSIS — Z992 Dependence on renal dialysis: Secondary | ICD-10-CM | POA: Diagnosis not present

## 2019-10-29 DIAGNOSIS — N2581 Secondary hyperparathyroidism of renal origin: Secondary | ICD-10-CM | POA: Diagnosis not present

## 2019-10-29 DIAGNOSIS — T8249XA Other complication of vascular dialysis catheter, initial encounter: Secondary | ICD-10-CM | POA: Diagnosis not present

## 2019-10-29 DIAGNOSIS — N186 End stage renal disease: Secondary | ICD-10-CM | POA: Diagnosis not present

## 2019-10-29 DIAGNOSIS — Z992 Dependence on renal dialysis: Secondary | ICD-10-CM | POA: Diagnosis not present

## 2019-10-30 ENCOUNTER — Encounter: Payer: Self-pay | Admitting: Nurse Practitioner

## 2019-10-30 MED ORDER — HYDRALAZINE HCL 25 MG PO TABS: 25.0000 mg | ORAL_TABLET | Freq: Three times a day (TID) | ORAL | 0 refills | Status: DC

## 2019-11-01 DIAGNOSIS — T8249XA Other complication of vascular dialysis catheter, initial encounter: Secondary | ICD-10-CM | POA: Diagnosis not present

## 2019-11-01 DIAGNOSIS — N2581 Secondary hyperparathyroidism of renal origin: Secondary | ICD-10-CM | POA: Diagnosis not present

## 2019-11-01 DIAGNOSIS — N186 End stage renal disease: Secondary | ICD-10-CM | POA: Diagnosis not present

## 2019-11-01 DIAGNOSIS — Z992 Dependence on renal dialysis: Secondary | ICD-10-CM | POA: Diagnosis not present

## 2019-11-02 ENCOUNTER — Other Ambulatory Visit: Payer: Self-pay | Admitting: *Deleted

## 2019-11-02 DIAGNOSIS — N184 Chronic kidney disease, stage 4 (severe): Secondary | ICD-10-CM

## 2019-11-03 DIAGNOSIS — Z992 Dependence on renal dialysis: Secondary | ICD-10-CM | POA: Diagnosis not present

## 2019-11-03 DIAGNOSIS — N186 End stage renal disease: Secondary | ICD-10-CM | POA: Diagnosis not present

## 2019-11-03 DIAGNOSIS — N2581 Secondary hyperparathyroidism of renal origin: Secondary | ICD-10-CM | POA: Diagnosis not present

## 2019-11-03 DIAGNOSIS — T8249XA Other complication of vascular dialysis catheter, initial encounter: Secondary | ICD-10-CM | POA: Diagnosis not present

## 2019-11-05 DIAGNOSIS — T8249XA Other complication of vascular dialysis catheter, initial encounter: Secondary | ICD-10-CM | POA: Diagnosis not present

## 2019-11-05 DIAGNOSIS — N2581 Secondary hyperparathyroidism of renal origin: Secondary | ICD-10-CM | POA: Diagnosis not present

## 2019-11-05 DIAGNOSIS — N186 End stage renal disease: Secondary | ICD-10-CM | POA: Diagnosis not present

## 2019-11-05 DIAGNOSIS — Z23 Encounter for immunization: Secondary | ICD-10-CM | POA: Diagnosis not present

## 2019-11-05 DIAGNOSIS — Z992 Dependence on renal dialysis: Secondary | ICD-10-CM | POA: Diagnosis not present

## 2019-11-08 ENCOUNTER — Telehealth (HOSPITAL_COMMUNITY): Payer: Self-pay

## 2019-11-08 DIAGNOSIS — Z992 Dependence on renal dialysis: Secondary | ICD-10-CM | POA: Diagnosis not present

## 2019-11-08 DIAGNOSIS — T8249XA Other complication of vascular dialysis catheter, initial encounter: Secondary | ICD-10-CM | POA: Diagnosis not present

## 2019-11-08 DIAGNOSIS — N186 End stage renal disease: Secondary | ICD-10-CM | POA: Diagnosis not present

## 2019-11-08 DIAGNOSIS — N2581 Secondary hyperparathyroidism of renal origin: Secondary | ICD-10-CM | POA: Diagnosis not present

## 2019-11-08 NOTE — Telephone Encounter (Signed)

## 2019-11-09 ENCOUNTER — Ambulatory Visit (HOSPITAL_COMMUNITY)
Admission: RE | Admit: 2019-11-09 | Discharge: 2019-11-09 | Disposition: A | Discharge status: Home / Self Care | Payer: BC Managed Care – PPO | Source: Ambulatory Visit | Attending: Vascular Surgery | Admitting: Vascular Surgery

## 2019-11-09 ENCOUNTER — Ambulatory Visit (INDEPENDENT_AMBULATORY_CARE_PROVIDER_SITE_OTHER): Payer: Self-pay | Admitting: Vascular Surgery

## 2019-11-09 ENCOUNTER — Encounter: Payer: Self-pay | Admitting: Vascular Surgery

## 2019-11-09 ENCOUNTER — Ambulatory Visit: Payer: BC Managed Care – PPO

## 2019-11-09 ENCOUNTER — Other Ambulatory Visit: Payer: Self-pay

## 2019-11-09 ENCOUNTER — Ambulatory Visit (INDEPENDENT_AMBULATORY_CARE_PROVIDER_SITE_OTHER)
Admission: RE | Admit: 2019-11-09 | Discharge: 2019-11-09 | Disposition: A | Discharge status: Home / Self Care | Payer: BC Managed Care – PPO | Source: Ambulatory Visit | Attending: Vascular Surgery | Admitting: Vascular Surgery

## 2019-11-09 DIAGNOSIS — E785 Hyperlipidemia, unspecified: Secondary | ICD-10-CM

## 2019-11-09 DIAGNOSIS — N184 Chronic kidney disease, stage 4 (severe): Secondary | ICD-10-CM

## 2019-11-09 DIAGNOSIS — Z992 Dependence on renal dialysis: Secondary | ICD-10-CM

## 2019-11-09 DIAGNOSIS — E1165 Type 2 diabetes mellitus with hyperglycemia: Secondary | ICD-10-CM

## 2019-11-09 DIAGNOSIS — I10 Essential (primary) hypertension: Secondary | ICD-10-CM

## 2019-11-09 DIAGNOSIS — IMO0002 Reserved for concepts with insufficient information to code with codable children: Secondary | ICD-10-CM

## 2019-11-09 DIAGNOSIS — Z794 Long term (current) use of insulin: Secondary | ICD-10-CM | POA: Diagnosis not present

## 2019-11-09 DIAGNOSIS — E1121 Type 2 diabetes mellitus with diabetic nephropathy: Secondary | ICD-10-CM | POA: Diagnosis not present

## 2019-11-09 DIAGNOSIS — Z1211 Encounter for screening for malignant neoplasm of colon: Secondary | ICD-10-CM

## 2019-11-09 DIAGNOSIS — Z125 Encounter for screening for malignant neoplasm of prostate: Secondary | ICD-10-CM

## 2019-11-09 DIAGNOSIS — N186 End stage renal disease: Secondary | ICD-10-CM

## 2019-11-09 NOTE — Progress Notes (Signed)
Patient name: Jeremy [name] MRN: [id number] DOB: 12/16/1965 Sex: male  REASON FOR CONSULT: Evaluate for permanent dialysis access  HPI: Jeremy [name] is a 54 y.o. male, with history of diabetes, hypertension, hyperlipidemia, stage IV CKD now progressed to ESRD that presents for evaluation of permanent dialysis access.  Patient states he is right-handed.  He has never had a fistula or other access in the past.  Currently dialyzing via right IJ tunneled catheter.  States he has never had any access in the past.  He currently is going to dialysis on Monday Wednesday and Friday.  He has no numbness or tingling in his hands of either upper extremity.  No chest wall implants other than RIJ tunneled catheter.   Past Medical History:  Diagnosis Date  . Abscess   . AKI (acute kidney injury) ([place]) 08/05/2017  . Bell's palsy   . CKD (chronic kidney disease)    Dr. McDiarmind   . High cholesterol   . Hypertension   . Left shoulder pain 10/26/2013   S/p injection on 10/26/13   . Renal insufficiency 02/14/2014  . Seizures ([place]) 08/04/2017   "related to high blood sugars" (08/05/2017)  . Type II diabetes mellitus ([place])     Past Surgical History:  Procedure Laterality Date  . Eyelid surgery Right   . I & D EXTREMITY  11/11/2011   Procedure: IRRIGATION AND DEBRIDEMENT EXTREMITY;  Surgeon: Merrie Roof, MD;  Location: [facility];  Service: General;  Laterality: Right;  I&D Right Thigh Abscess  . IR FLUORO GUIDE CV LINE RIGHT  08/12/2019  . IR US GUIDE VASC ACCESS RIGHT  08/12/2019    Family History  Problem Relation Age of Onset  . Diabetes Mother   . Heart disease Mother 85  . Diabetes Sister   . Diabetes Brother   . Diabetes Brother   . Cancer Maternal Uncle        type unknown  . Stroke Neg Hx     SOCIAL HISTORY: Social History   Socioeconomic History  . Marital status: Divorced    Spouse name: Not on file  . Number of children: 3  . Years of education: Not on file  . Highest education  level: Not on file  Occupational History  . Occupation: maintenance tech  Tobacco Use  . Smoking status: Never Smoker  . Smokeless tobacco: Never Used  Substance and Sexual Activity  . Alcohol use: Not Currently    Comment: rarely-once or twice a year  . Drug use: No  . Sexual activity: Yes  Other Topics Concern  . 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  Did not graduate high school.  Has 3 adult children in [place].  Divorced.  Lives with girlfriend [name].                   Social Determinants of Health   Financial Resource Strain:   . Difficulty of Paying Living Expenses:   Food Insecurity:   . Worried About Charity fundraiser in the Last Year:   . Arboriculturist in the Last Year:   Transportation Needs:   . Film/video editor (Medical):   Marland Kitchen Lack of Transportation (Non-Medical):   Physical Activity: Unknown  . Days of Exercise per Week: 2 days  . Minutes of Exercise per Session: Not on file  Stress:   . Feeling of Stress :  Social Connections:   . Frequency of Communication with Friends and Family:   . Frequency of Social Gatherings with Friends and Family:   . Attends Religious Services:   . Active Member of Clubs or Organizations:   . Attends Archivist Meetings:   Marland Kitchen Marital Status:   Intimate Partner Violence:   . Fear of Current or Ex-Partner:   . Emotionally Abused:   Marland Kitchen Physically Abused:   . Sexually Abused:     No Known Allergies  Current Outpatient Medications  Medication Sig Dispense Refill  . amLODipine (NORVASC) 10 MG tablet Take 1 tablet (10 mg total) by mouth daily. 30 tablet 0  . aspirin EC 81 MG tablet Take 1 tablet (81 mg total) by mouth daily. 90 tablet 3  . atorvastatin (LIPITOR) 40 MG tablet Take 1 tablet (40 mg total) by mouth daily. 30 tablet 0  . Blood Glucose Monitoring Suppl (CONTOUR NEXT MONITOR) w/Device KIT 1 Device by Does not apply  route 4 (four) times daily - after meals and at bedtime. 1 kit 0  . carvedilol (COREG) 6.25 MG tablet Take 1 tablet (6.25 mg total) by mouth 2 (two) times daily with a meal. 60 tablet 0  . enalapril (VASOTEC) 5 MG tablet Take 1 tablet (5 mg total) by mouth daily. 30 tablet 0  . furosemide (LASIX) 80 MG tablet Take 1 tablet (80 mg total) by mouth 2 (two) times daily. 60 tablet 0  . glucose blood test strip Use as instructed 100 each 12  . hydrALAZINE (APRESOLINE) 25 MG tablet Take 1 tablet (25 mg total) by mouth 3 (three) times daily. 90 tablet 0  . insulin lispro (HUMALOG KWIKPEN) 100 UNIT/ML KwikPen Inject 0.1-0.15 mLs (10-15 Units total) into the skin 2 (two) times daily before a meal. 15 mL   . Insulin Pen Needle (B-D ULTRAFINE III SHORT PEN) 31G X 8 MM MISC 1 application by Does not apply route 4 (four) times daily. 100 each 11  . Lancet Devices (MICROLET NEXT LANCING DEVICE) MISC 1 Device by Does not apply route 3 (three) times daily. 1 each 11  . Levetiracetam 750 MG TB24 Take 2 tablets (1,500 mg total) by mouth daily. 60 tablet 0  . Microlet Lancets MISC 1 Device by Does not apply route 3 (three) times daily. 100 each 11  . polyethylene glycol powder (GLYCOLAX/MIRALAX) powder Take 17 g (or 1 tablespoon) daily mixed in water. 255 g 0  . Semaglutide,0.25 or 0.5MG/DOS, (OZEMPIC, 0.25 OR 0.5 MG/DOSE,) 2 MG/1.5ML SOPN Inject 0.375 mg into the skin once a week. 2.63 mg plus 5 clicks 3 pen 0   No current facility-administered medications for this visit.    REVIEW OF SYSTEMS:  '[X]'  denotes positive finding, '[ ]'  denotes negative finding Cardiac  Comments:  Chest pain or chest pressure:    Shortness of breath upon exertion:    Short of breath when lying flat:    Irregular heart rhythm:        Vascular    Pain in calf, thigh, or hip brought on by ambulation:    Pain in feet at night that wakes you up from your sleep:     Blood clot in your veins:    Leg swelling:         Pulmonary     Oxygen at home:    Productive cough:     Wheezing:         Neurologic    Sudden weakness in arms  or legs:     Sudden numbness in arms or legs:     Sudden onset of difficulty speaking or slurred speech:    Temporary loss of vision in one eye:     Problems with dizziness:         Gastrointestinal    Blood in stool:     Vomited blood:         Genitourinary    Burning when urinating:     Blood in urine:        Psychiatric    Major depression:         Hematologic    Bleeding problems:    Problems with blood clotting too easily:        Skin    Rashes or ulcers:        Constitutional    Fever or chills:      PHYSICAL EXAM: Vitals:   11/09/19 0852  BP: 121/73  Pulse: 75  Resp: 18  Temp: (!) 97.3 F (36.3 C)  TempSrc: Temporal  SpO2: 100%  Weight: 294 lb (133.4 kg)  Height: '5\' 8"'  (1.727 m)    GENERAL: The patient is a well-nourished male, in no acute distress. The vital signs are documented above. CARDIAC: There is a regular rate and rhythm.  VASCULAR:  Palpable radial and brachial pulses bilateral upper extremities RIJ tunneled catheter PULMONARY: There is good air exchange bilaterally without wheezing or rales. ABDOMEN: Soft and non-tender with normal pitched bowel sounds.  MUSCULOSKELETAL: There are no major deformities or cyanosis. NEUROLOGIC: No focal weakness or paresthesias are detected. SKIN: There are no ulcers or rashes noted. PSYCHIATRIC: The patient has a normal affect.  DATA:   I independently reviewed arterial duplex upper extremities and triphasic waveforms in bilateral upper extremities  On vein mapping appears to have usable cephalic and basilic veins in bilateral upper extremities.  On the left which is his nondominant arm appears to have a nice cephalic vein down to the wrist.  Assessment/Plan:  54 year old male with diabetes hypertension hyperlipidemia now ESRD on dialysis Monday Wednesday and Friday that presents for evaluation of  permanent dialysis access.  Patient is right-handed and discussed plans to place access in the nondominant arm which would be his left arm.  Reviewed that vein mapping he has a nice cephalic vein down to the wrist and I would be hopeful to do a radiocephalic fistula at the wrist but some chance it would need to be at the elbow.  I discussed the steps of surgery in detail as well as risk and benefits including failure to mature, steal, bleeding, infection, etc.  We will schedule on a nondialysis day in the near future.   [name], MD [facility] Office: [phone]

## 2019-11-10 ENCOUNTER — Other Ambulatory Visit: Payer: Self-pay

## 2019-11-10 ENCOUNTER — Other Ambulatory Visit (HOSPITAL_COMMUNITY)
Admission: RE | Admit: 2019-11-10 | Discharge: 2019-11-10 | Disposition: A | Discharge status: Home / Self Care | Payer: Medicare Other | Source: Ambulatory Visit | Attending: Vascular Surgery | Admitting: Vascular Surgery

## 2019-11-10 ENCOUNTER — Other Ambulatory Visit: Payer: Self-pay | Admitting: Nurse Practitioner

## 2019-11-10 ENCOUNTER — Encounter (HOSPITAL_COMMUNITY): Payer: Self-pay | Admitting: Vascular Surgery

## 2019-11-10 DIAGNOSIS — Z114 Encounter for screening for human immunodeficiency virus [HIV]: Secondary | ICD-10-CM

## 2019-11-10 DIAGNOSIS — Z20822 Contact with and (suspected) exposure to covid-19: Secondary | ICD-10-CM | POA: Diagnosis not present

## 2019-11-10 DIAGNOSIS — T8249XA Other complication of vascular dialysis catheter, initial encounter: Secondary | ICD-10-CM | POA: Diagnosis not present

## 2019-11-10 DIAGNOSIS — N186 End stage renal disease: Secondary | ICD-10-CM | POA: Diagnosis not present

## 2019-11-10 DIAGNOSIS — N2581 Secondary hyperparathyroidism of renal origin: Secondary | ICD-10-CM | POA: Diagnosis not present

## 2019-11-10 DIAGNOSIS — Z992 Dependence on renal dialysis: Secondary | ICD-10-CM | POA: Diagnosis not present

## 2019-11-10 DIAGNOSIS — Z01812 Encounter for preprocedural laboratory examination: Secondary | ICD-10-CM | POA: Insufficient documentation

## 2019-11-10 DIAGNOSIS — R972 Elevated prostate specific antigen [PSA]: Secondary | ICD-10-CM

## 2019-11-10 LAB — LIPID PANEL
Chol/HDL Ratio: 3.1 ratio (ref 0.0–5.0)
Cholesterol, Total: 144 mg/dL (ref 100–199)
HDL: 46 mg/dL (ref >39)
LDL Chol Calc (NIH): 80 mg/dL (ref 0–99)
Triglycerides: 95 mg/dL (ref 0–149)
VLDL Cholesterol Cal: 18 mg/dL (ref 5–40)

## 2019-11-10 LAB — CMP14+EGFR
ALT: 15 IU/L (ref 0–44)
AST: 17 IU/L (ref 0–40)
Albumin/Globulin Ratio: 1.2 (ref 1.2–2.2)
Albumin: 4.2 g/dL (ref 3.8–4.9)
Alkaline Phosphatase: 99 IU/L (ref 39–117)
BUN/Creatinine Ratio: 7 — ABNORMAL LOW (ref 9–20)
BUN: 56 mg/dL — ABNORMAL HIGH (ref 6–24)
Bilirubin Total: 0.2 mg/dL (ref 0.0–1.2)
CO2: 25 mmol/L (ref 20–29)
Calcium: 9 mg/dL (ref 8.7–10.2)
Chloride: 96 mmol/L (ref 96–106)
Creatinine, Ser: 8.33 mg/dL — ABNORMAL HIGH (ref 0.76–1.27)
GFR calc Af Amer: 8 mL/min/{1.73_m2} — ABNORMAL LOW (ref >59)
GFR calc non Af Amer: 7 mL/min/{1.73_m2} — ABNORMAL LOW (ref >59)
Globulin, Total: 3.5 g/dL (ref 1.5–4.5)
Glucose: 166 mg/dL — ABNORMAL HIGH (ref 65–99)
Potassium: 5.1 mmol/L (ref 3.5–5.2)
Sodium: 140 mmol/L (ref 134–144)
Total Protein: 7.7 g/dL (ref 6.0–8.5)

## 2019-11-10 LAB — SARS CORONAVIRUS 2 (TAT 6-24 HRS): SARS Coronavirus 2: NEGATIVE

## 2019-11-10 LAB — CBC
Hematocrit: 33.8 % — ABNORMAL LOW (ref 37.5–51.0)
Hemoglobin: 11 g/dL — ABNORMAL LOW (ref 13.0–17.7)
MCH: 26.2 pg — ABNORMAL LOW (ref 26.6–33.0)
MCHC: 32.5 g/dL (ref 31.5–35.7)
MCV: 81 fL (ref 79–97)
Platelets: 208 10*3/uL (ref 150–450)
RBC: 4.2 x10E6/uL (ref 4.14–5.80)
RDW: 14.5 % (ref 11.6–15.4)
WBC: 7 10*3/uL (ref 3.4–10.8)

## 2019-11-10 LAB — HEMOGLOBIN A1C
Est. average glucose Bld gHb Est-mCnc: 186 mg/dL
Hgb A1c MFr Bld: 8.1 % — ABNORMAL HIGH (ref 4.8–5.6)

## 2019-11-10 LAB — PSA: Prostate Specific Ag, Serum: 18.2 ng/mL — ABNORMAL HIGH (ref 0.0–4.0)

## 2019-11-10 MED ORDER — DEXTROSE 5 % IV SOLN
3.0000 g | INTRAVENOUS | Status: AC
  Administered 2019-11-11: 3 g via INTRAVENOUS
  Filled 2019-11-10: qty 3
  Filled 2019-11-10: qty 3000

## 2019-11-10 NOTE — Anesthesia Preprocedure Evaluation (Addendum)
Anesthesia Evaluation  Patient identified by MRN, date of birth, ID band Patient awake    Reviewed: Allergy & Precautions, NPO status , Patient's Chart, lab work & pertinent test results  Airway Mallampati: III  TM Distance: >3 FB Neck ROM: Full    Dental  (+) Missing   Pulmonary    Pulmonary exam normal breath sounds clear to auscultation       Cardiovascular hypertension, Pt. on medications and Pt. on home beta blockers Normal cardiovascular exam Rhythm:Regular Rate:Normal  ECG: SR, rate 93   Neuro/Psych  Headaches, Seizures -, Well Controlled,  negative psych ROS   GI/Hepatic negative GI ROS, Neg liver ROS,   Endo/Other  diabetes, Insulin DependentMorbid obesity  Renal/GU ESRF and DialysisRenal disease     Musculoskeletal negative musculoskeletal ROS (+)   Abdominal   Peds  Hematology HLD   Anesthesia Other Findings END STAGE RENAL DISEASE  Reproductive/Obstetrics                             Anesthesia Physical Anesthesia Plan  ASA: IV  Anesthesia Plan: Regional   Post-op Pain Management:    Induction: Intravenous  PONV Risk Score and Plan: 1 and Ondansetron, Dexamethasone, Propofol infusion and Treatment may vary due to age or medical condition  Airway Management Planned: Simple Face Mask  Additional Equipment:   Intra-op Plan:   Post-operative Plan:   Informed Consent: I have reviewed the patients History and Physical, chart, labs and discussed the procedure including the risks, benefits and alternatives for the proposed anesthesia with the patient or authorized representative who has indicated his/her understanding and acceptance.     Dental advisory given  Plan Discussed with: CRNA  Anesthesia Plan Comments: (Reviewed PAT note written 11/10/2019 by Myra Gianotti, PA-C. )       Anesthesia Quick Evaluation

## 2019-11-10 NOTE — Progress Notes (Signed)
Anesthesia Chart Review:  Case: [id number] Date/Time: 11/11/19 1346   Procedure: ARTERIOVENOUS (AV) FISTULA CREATION (Left )   Anesthesia type: Monitor Anesthesia Care   Pre-op diagnosis: END STAGE RENAL DISEASE   Location: [facility] OR ROOM 11 / [place] [facility]   Surgeons: [name], MD      DISCUSSION: Patient is a 54 year old male scheduled for the above procedure. He is currently using a right IJ [facility] for MWF hemodialysis.  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 - Admission 08/08/19-08/13/19 for acute respiratory failure, COVID-19+, required up to 5L/[facility]. S/p dexamethasone and 5 day course of remdesivir.  He had progression of CKD to ESRD and tunneled dialysis catehter placed by IR and started on RRT on 08/13/19. He was to started hemodialysis on 08/14/19, but left AMA on 08/13/2019 after completing remdesivir. He had been weaned from supplemental O2.   EF 45-50% with mild diffuse LV hypokinesis during HHNS admission. Recently established with new PCP on 10/26/19. Denied chest pain, shortness of breath, palpitations, lightheadedness, dizziness, headaches or BLE edema at that time.  11/09/2019 presurgical COVID-19 test in process.  Anesthesia team to evaluate on the day of surgery.  Reviewed with anesthesiologist Lillia Abed, MD.   VS: As of 11/09/19, WT 133.4 kg, BP 121/73, HR 75.     PROVIDERSGildardo Pounds, NP is PCP -- recently established 10/26/19 (previously seen at the [facility])   LABS: He is for ISTAT on the day of surgery. As of 11/09/19, glucose 166, Cr 8.33, K 5.1, AST 17, ALT 15. H/H 11.0/33.8, PLT 208, A1c 8.1% (down from 9.0% 3 months ago).    IMAGES: CHEST - 2 VIEW 08/08/19 (in setting of acute respiratory failure/COVID-19): COMPARISON:  09/24/2018 FINDINGS: Interval development of  extensive moderate patchy airspace opacities with relative sparing of the apices. Heart size upper limits normal. No pneumothorax. No pleural effusion. Visualized bones unremarkable. IMPRESSION: New bilateral airspace disease.   EKG: 08/08/19: Sinus rhythm Ventricular premature complex Probable left atrial enlargement Borderline T abnormalities, inferior leads No acute changes TWI are not new (inferior and lateral leads) Confirmed by Varney Biles [phone]) on 08/08/2019 1:56:37 PM - T wave abnormality is less apparent when compared to 08/09/17 tracing   CV: Echo 08/05/17 (in the setting of hyperosmolar non-ketotic syndrome/seizure/hypertensive urgency) Study Conclusions  - Left ventricle: The cavity size was normal. There was mild focal  basal hypertrophy of the septum. Systolic function was mildly  reduced. The estimated ejection fraction was in the range of 45%  to 50%. Diffuse hypokinesis. Indeterminant diastolic function.  - Aortic valve: There was no stenosis.  - Mitral valve: There was no significant regurgitation.  - Right ventricle: The cavity size was normal. Systolic function  was normal.  - Pulmonary arteries: No complete TR doppler jet so unable to  estimate PA systolic pressure.  - Inferior vena cava: The vessel was normal in size. The  respirophasic diameter changes were in the normal range (>= 50%),  consistent with normal central venous pressure.  Impressions:  - Technically difficult study with poor acoustic windows. Normal LV  size with mild focal basal septal hypertrophy. EF 45-50%, diffuse  mild hypokinesis. Normal RV size and systolic function.  (Echo results noted by nephrology and [facility]. No chest pain. No additional testing ordered at that time, although both mentioned could consider further evaluation in  the future. He continued to be followed [facility] until 08/2019 and no additional  cardiac testing testing or referrals ordered.)   Past Medical History:  Diagnosis Date  . Abscess   . AKI (acute kidney injury) ([place]) 08/05/2017  . Bell's palsy   . CKD (chronic kidney disease)    Dr. McDiarmind   . High cholesterol   . Hypertension   . Left shoulder pain 10/26/2013   S/p injection on 10/26/13   . Renal insufficiency 02/14/2014  . Seizures ([place]) 08/04/2017   "related to high blood sugars" (08/05/2017)  . Type II diabetes mellitus ([place])     Past Surgical History:  Procedure Laterality Date  . Eyelid surgery Right   . I & D EXTREMITY  11/11/2011   Procedure: IRRIGATION AND DEBRIDEMENT EXTREMITY;  Surgeon: Merrie Roof, MD;  Location: [facility];  Service: General;  Laterality: Right;  I&D Right Thigh Abscess  . IR FLUORO GUIDE CV LINE RIGHT  08/12/2019  . IR US GUIDE VASC ACCESS RIGHT  08/12/2019    MEDICATIONS: . Derrill Memo ON 11/11/2019] ceFAZolin (ANCEF) 3 g in dextrose 5 % 50 mL IVPB   . amLODipine (NORVASC) 10 MG tablet  . aspirin EC 81 MG tablet  . atorvastatin (LIPITOR) 40 MG tablet  . Blood Glucose Monitoring Suppl (CONTOUR NEXT MONITOR) w/Device KIT  . carvedilol (COREG) 6.25 MG tablet  . enalapril (VASOTEC) 5 MG tablet  . furosemide (LASIX) 80 MG tablet  . glucose blood test strip  . hydrALAZINE (APRESOLINE) 25 MG tablet  . insulin lispro (HUMALOG KWIKPEN) 100 UNIT/ML KwikPen  . Insulin Pen Needle (B-D ULTRAFINE III SHORT PEN) 31G X 8 MM MISC  . Lancet Devices (MICROLET NEXT LANCING DEVICE) MISC  . Levetiracetam 750 MG TB24  . Microlet Lancets MISC  . polyethylene glycol powder (GLYCOLAX/MIRALAX) powder  . Semaglutide,0.25 or 0.5MG/DOS, (OZEMPIC, 0.25 OR 0.5 MG/DOSE,) 2 MG/1.5ML SOPN    Myra Gianotti, PA-C Surgical Short Stay/Anesthesiology [facility] Phone [phone] [facility] Phone [phone] 11/10/2019 2:05 PM

## 2019-11-10 NOTE — Progress Notes (Signed)
Pt denies SOB, chest pain, and being under the care of a cardiologist. Pt stated that he is under the care of [name], NP. Pt denies having a stress test and cardiac cath. Pt made aware to stop taking vitamins, fish oil and herbal medications. Do not take any NSAIDs ie: Ibuprofen, Advil, Naproxen (Aleve), Motrin, BC and Goody Powder. Pt stated that Ozempic is taken on Mondays only. Pt made aware to check CBG every 2 hours prior to arrival to hospital on DOS. Pt made aware to treat a CBG < 70 with 4 ounces of apple or cranberry juice, wait 15 minutes after intervention to recheck CBG, if CBG remains < 70, call Short Stay unit to speak with a nurse. Pt made aware to take 1/2 of Humalog SS for CBG >220. Pt reminded to quarantine. Pt verbalized understanding of all pre-op instructions. PA, Anesthesiology, asked to review pt history.

## 2019-11-11 ENCOUNTER — Ambulatory Visit (HOSPITAL_COMMUNITY): Payer: BC Managed Care – PPO | Admitting: Vascular Surgery

## 2019-11-11 ENCOUNTER — Ambulatory Visit (HOSPITAL_COMMUNITY)
Admission: RE | Admit: 2019-11-11 | Discharge: 2019-11-11 | Disposition: A | Discharge status: Home / Self Care | Payer: BC Managed Care – PPO | Attending: Vascular Surgery | Admitting: Vascular Surgery

## 2019-11-11 ENCOUNTER — Encounter (HOSPITAL_COMMUNITY): Payer: Self-pay | Admitting: Vascular Surgery

## 2019-11-11 ENCOUNTER — Encounter (HOSPITAL_COMMUNITY)
Admission: RE | Disposition: A | Discharge status: Home / Self Care | Payer: Self-pay | Source: Home / Self Care | Attending: Vascular Surgery

## 2019-11-11 ENCOUNTER — Other Ambulatory Visit: Payer: Self-pay

## 2019-11-11 DIAGNOSIS — E785 Hyperlipidemia, unspecified: Secondary | ICD-10-CM | POA: Diagnosis not present

## 2019-11-11 DIAGNOSIS — Z794 Long term (current) use of insulin: Secondary | ICD-10-CM | POA: Insufficient documentation

## 2019-11-11 DIAGNOSIS — E1122 Type 2 diabetes mellitus with diabetic chronic kidney disease: Secondary | ICD-10-CM | POA: Insufficient documentation

## 2019-11-11 DIAGNOSIS — I120 Hypertensive chronic kidney disease with stage 5 chronic kidney disease or end stage renal disease: Secondary | ICD-10-CM | POA: Insufficient documentation

## 2019-11-11 DIAGNOSIS — Z79899 Other long term (current) drug therapy: Secondary | ICD-10-CM | POA: Diagnosis not present

## 2019-11-11 DIAGNOSIS — E7800 Pure hypercholesterolemia, unspecified: Secondary | ICD-10-CM | POA: Insufficient documentation

## 2019-11-11 DIAGNOSIS — Z833 Family history of diabetes mellitus: Secondary | ICD-10-CM | POA: Diagnosis not present

## 2019-11-11 DIAGNOSIS — R569 Unspecified convulsions: Secondary | ICD-10-CM | POA: Insufficient documentation

## 2019-11-11 DIAGNOSIS — N184 Chronic kidney disease, stage 4 (severe): Secondary | ICD-10-CM | POA: Diagnosis not present

## 2019-11-11 DIAGNOSIS — Z992 Dependence on renal dialysis: Secondary | ICD-10-CM | POA: Diagnosis not present

## 2019-11-11 DIAGNOSIS — Z7982 Long term (current) use of aspirin: Secondary | ICD-10-CM | POA: Insufficient documentation

## 2019-11-11 DIAGNOSIS — N179 Acute kidney failure, unspecified: Secondary | ICD-10-CM | POA: Diagnosis not present

## 2019-11-11 DIAGNOSIS — N186 End stage renal disease: Secondary | ICD-10-CM

## 2019-11-11 HISTORY — PX: AV FISTULA PLACEMENT: SHX1204

## 2019-11-11 HISTORY — DX: Headache, unspecified: R51.9

## 2019-11-11 LAB — GLUCOSE, CAPILLARY
Glucose-Capillary: 135 mg/dL — ABNORMAL HIGH (ref 70–99)
Glucose-Capillary: 156 mg/dL — ABNORMAL HIGH (ref 70–99)

## 2019-11-11 LAB — POCT I-STAT, CHEM 8
BUN: 46 mg/dL — ABNORMAL HIGH (ref 6–20)
Calcium, Ion: 0.79 mmol/L — CL (ref 1.15–1.40)
Chloride: 102 mmol/L (ref 98–111)
Creatinine, Ser: 9 mg/dL — ABNORMAL HIGH (ref 0.61–1.24)
Glucose, Bld: 142 mg/dL — ABNORMAL HIGH (ref 70–99)
HCT: 36 % — ABNORMAL LOW (ref 39.0–52.0)
Hemoglobin: 12.2 g/dL — ABNORMAL LOW (ref 13.0–17.0)
Potassium: 5 mmol/L (ref 3.5–5.1)
Sodium: 135 mmol/L (ref 135–145)
TCO2: 27 mmol/L (ref 22–32)

## 2019-11-11 SURGERY — ARTERIOVENOUS (AV) FISTULA CREATION
Anesthesia: Regional | Site: Arm Lower | Laterality: Left

## 2019-11-11 MED ORDER — ONDANSETRON HCL 4 MG/2ML IJ SOLN
INTRAMUSCULAR | Status: DC | PRN
  Administered 2019-11-11: 4 mg via INTRAVENOUS

## 2019-11-11 MED ORDER — PHENYLEPHRINE HCL-NACL 10-0.9 MG/250ML-% IV SOLN
INTRAVENOUS | Status: DC | PRN
  Administered 2019-11-11: 30 ug/min via INTRAVENOUS

## 2019-11-11 MED ORDER — FENTANYL CITRATE (PF) 100 MCG/2ML IJ SOLN
INTRAMUSCULAR | Status: AC
  Administered 2019-11-11: 100 ug via INTRAVENOUS
  Filled 2019-11-11: qty 2

## 2019-11-11 MED ORDER — OXYCODONE-ACETAMINOPHEN 7.5-325 MG PO TABS: 1.0000 | ORAL_TABLET | ORAL | 0 refills | Status: DC | PRN

## 2019-11-11 MED ORDER — PAPAVERINE HCL 30 MG/ML IJ SOLN
INTRAMUSCULAR | Status: AC
  Filled 2019-11-11: qty 2

## 2019-11-11 MED ORDER — FENTANYL CITRATE (PF) 100 MCG/2ML IJ SOLN: 100.0000 ug | Freq: Once | INTRAMUSCULAR | Status: AC

## 2019-11-11 MED ORDER — ONDANSETRON HCL 4 MG/2ML IJ SOLN: 4.0000 mg | Freq: Once | INTRAMUSCULAR | Status: DC | PRN

## 2019-11-11 MED ORDER — LIDOCAINE-EPINEPHRINE (PF) 1.5 %-1:200000 IJ SOLN
INTRAMUSCULAR | Status: DC | PRN
  Administered 2019-11-11: 30 mL via PERINEURAL

## 2019-11-11 MED ORDER — CHLORHEXIDINE GLUCONATE 4 % EX LIQD: 60.0000 mL | Freq: Once | CUTANEOUS | Status: DC

## 2019-11-11 MED ORDER — ACETAMINOPHEN 10 MG/ML IV SOLN: 1000.0000 mg | Freq: Once | INTRAVENOUS | Status: DC | PRN

## 2019-11-11 MED ORDER — SODIUM CHLORIDE 0.9 % IV SOLN: INTRAVENOUS | Status: DC

## 2019-11-11 MED ORDER — LIDOCAINE HCL 1 % IJ SOLN
INTRAMUSCULAR | Status: DC | PRN
  Administered 2019-11-11: 3 mL

## 2019-11-11 MED ORDER — 0.9 % SODIUM CHLORIDE (POUR BTL) OPTIME
TOPICAL | Status: DC | PRN
  Administered 2019-11-11: 1000 mL

## 2019-11-11 MED ORDER — PROPOFOL 500 MG/50ML IV EMUL
INTRAVENOUS | Status: DC | PRN
  Administered 2019-11-11: 75 ug/kg/min via INTRAVENOUS

## 2019-11-11 MED ORDER — SODIUM CHLORIDE 0.9 % IV SOLN
INTRAVENOUS | Status: AC
  Filled 2019-11-11: qty 1.2

## 2019-11-11 MED ORDER — HEPARIN SODIUM (PORCINE) 1000 UNIT/ML IJ SOLN
INTRAMUSCULAR | Status: DC | PRN
  Administered 2019-11-11: 3000 [IU] via INTRAVENOUS

## 2019-11-11 MED ORDER — FENTANYL CITRATE (PF) 100 MCG/2ML IJ SOLN: 25.0000 ug | INTRAMUSCULAR | Status: DC | PRN

## 2019-11-11 MED ORDER — SODIUM CHLORIDE 0.9 % IV SOLN
INTRAVENOUS | Status: DC | PRN
  Administered 2019-11-11: 500 mL

## 2019-11-11 MED ORDER — LIDOCAINE HCL (PF) 1 % IJ SOLN
INTRAMUSCULAR | Status: AC
  Filled 2019-11-11: qty 30

## 2019-11-11 MED ORDER — CEFAZOLIN SODIUM-DEXTROSE 2-4 GM/100ML-% IV SOLN
INTRAVENOUS | Status: AC
  Filled 2019-11-11: qty 100

## 2019-11-11 SURGICAL SUPPLY — 33 items
ARMBAND PINK RESTRICT EXTREMIT (MISCELLANEOUS) ×2 IMPLANT
BLADE CLIPPER SURG (BLADE) IMPLANT
CANISTER SUCT 3000ML PPV (MISCELLANEOUS) ×2 IMPLANT
CLIP VESOCCLUDE MED 6/CT (CLIP) ×2 IMPLANT
CLIP VESOCCLUDE SM WIDE 6/CT (CLIP) ×4 IMPLANT
COVER PROBE W GEL 5X96 (DRAPES) ×2 IMPLANT
COVER WAND RF STERILE (DRAPES) IMPLANT
DECANTER SPIKE VIAL GLASS SM (MISCELLANEOUS) ×2 IMPLANT
DERMABOND ADVANCED (GAUZE/BANDAGES/DRESSINGS) ×1
DERMABOND ADVANCED .7 DNX12 (GAUZE/BANDAGES/DRESSINGS) ×1 IMPLANT
ELECT REM PT RETURN 9FT ADLT (ELECTROSURGICAL) ×2
ELECTRODE REM PT RTRN 9FT ADLT (ELECTROSURGICAL) ×1 IMPLANT
GLOVE BIO SURGEON STRL SZ7.5 (GLOVE) ×2 IMPLANT
GLOVE BIOGEL PI IND STRL 8 (GLOVE) ×1 IMPLANT
GLOVE BIOGEL PI INDICATOR 8 (GLOVE) ×1
GOWN STRL REUS W/ TWL LRG LVL3 (GOWN DISPOSABLE) ×2 IMPLANT
GOWN STRL REUS W/ TWL XL LVL3 (GOWN DISPOSABLE) ×1 IMPLANT
GOWN STRL REUS W/TWL LRG LVL3 (GOWN DISPOSABLE) ×4
GOWN STRL REUS W/TWL XL LVL3 (GOWN DISPOSABLE) ×2
HEMOSTAT SPONGE AVITENE ULTRA (HEMOSTASIS) IMPLANT
KIT BASIN OR (CUSTOM PROCEDURE TRAY) ×2 IMPLANT
KIT TURNOVER KIT B (KITS) ×2 IMPLANT
NS IRRIG 1000ML POUR BTL (IV SOLUTION) ×2 IMPLANT
PACK CV ACCESS (CUSTOM PROCEDURE TRAY) ×2 IMPLANT
PAD ARMBOARD 7.5X6 YLW CONV (MISCELLANEOUS) ×4 IMPLANT
SUT MNCRL AB 4-0 PS2 18 (SUTURE) ×2 IMPLANT
SUT PROLENE 6 0 BV (SUTURE) ×4 IMPLANT
SUT PROLENE 7 0 BV 1 (SUTURE) ×2 IMPLANT
SUT VIC AB 3-0 SH 27 (SUTURE) ×2
SUT VIC AB 3-0 SH 27X BRD (SUTURE) ×1 IMPLANT
TOWEL GREEN STERILE (TOWEL DISPOSABLE) ×2 IMPLANT
UNDERPAD 30X30 (UNDERPADS AND DIAPERS) ×2 IMPLANT
WATER STERILE IRR 1000ML POUR (IV SOLUTION) ×2 IMPLANT

## 2019-11-11 NOTE — Anesthesia Postprocedure Evaluation (Signed)
Anesthesia Post Note  Patient: Jeremy [name]  Procedure(s) Performed: LEFT FOREARM RADIOCEPHALIC ARTERIOVENOUS (AV) FISTULA CREATION (Left Arm Lower)     Patient location during evaluation: PACU Anesthesia Type: Regional Level of consciousness: awake and alert Pain management: pain level controlled Vital Signs Assessment: post-procedure vital signs reviewed and stable Respiratory status: spontaneous breathing, nonlabored ventilation, respiratory function stable and patient connected to nasal cannula oxygen Cardiovascular status: stable and blood pressure returned to baseline Postop Assessment: no apparent nausea or vomiting Anesthetic complications: no    Last Vitals:  Vitals:   11/11/19 1630 11/11/19 1645  BP: (!) 128/102 138/83  Pulse: 62 84  Resp: (!) 22 16  Temp: (!) 36.3 C (!) 36.3 C  SpO2: 100% 94%    Last Pain:  Vitals:   11/11/19 1645  TempSrc:   PainSc: 0-No pain                 Shakora Nordquist P Delayla Hoffmaster

## 2019-11-11 NOTE — Anesthesia Procedure Notes (Addendum)
Anesthesia Regional Block: Supraclavicular block   Pre-Anesthetic Checklist: ,, timeout performed, Correct Patient, Correct Site, Correct Laterality, Correct Procedure, Correct Position, site marked, Risks and benefits discussed,  Surgical consent,  Pre-op evaluation,  At surgeon's request and post-op pain management  Laterality: Left  Prep: chloraprep       Needles:  Injection technique: Single-shot  Needle Type: Echogenic Stimulator Needle     Needle Length: 9cm  Needle Gauge: 21     Additional Needles:   Procedures:,,,, ultrasound used (permanent image in chart),,,,  Narrative:  Start time: 11/11/2019 12:40 PM End time: 11/11/2019 12:50 PM Injection made incrementally with aspirations every 5 mL.  Performed by: Personally  Anesthesiologist: Murvin Natal, MD  Additional Notes: Functioning IV was confirmed and monitors were applied.  A timeout was performed. Sterile prep, hand hygiene and sterile gloves were used. A 80mm 21ga Arrow echogenic stimulator needle was used. Negative aspiration and negative test dose prior to incremental administration of local anesthetic. The patient tolerated the procedure well.  Ultrasound guidance: relevent anatomy identified, needle position confirmed, local anesthetic spread visualized around nerve(s), vascular puncture avoided.  Image printed for medical record.

## 2019-11-11 NOTE — Anesthesia Procedure Notes (Signed)
Procedure Name: MAC Date/Time: 11/11/2019 1:20 PM Performed by: Griffin Dakin, CRNA Pre-anesthesia Checklist: Patient identified, Emergency Drugs available, Suction available and Patient being monitored Oxygen Delivery Method: Simple face mask Induction Type: IV induction Placement Confirmation: positive ETCO2 Dental Injury: Teeth and Oropharynx as per pre-operative assessment

## 2019-11-11 NOTE — Transfer of Care (Signed)
Immediate Anesthesia Transfer of Care Note  Patient: Jeremy [name]  Procedure(s) Performed: LEFT FOREARM RADIOCEPHALIC ARTERIOVENOUS (AV) FISTULA CREATION (Left Arm Lower)  Patient Location: PACU  Anesthesia Type:MAC  Level of Consciousness: awake, alert  and oriented  Airway & Oxygen Therapy: Patient Spontanous Breathing and Patient connected to face mask oxygen  Post-op Assessment: Report given to RN and Post -op Vital signs reviewed and stable  Post vital signs: Reviewed and stable  Last Vitals:  Vitals Value Taken Time  BP 128/102 11/11/19 1631  Temp    Pulse 83 11/11/19 1638  Resp 18 11/11/19 1638  SpO2 95 % 11/11/19 1638  Vitals shown include unvalidated device data.  Last Pain:  Vitals:   11/11/19 1630  TempSrc:   PainSc: (P) 0-No pain         Complications: No apparent anesthesia complications

## 2019-11-11 NOTE — Discharge Instructions (Signed)
[facility]  Discharge Instructions  AV Fistula or Graft Surgery for Dialysis Access  Please refer to the following instructions for your post-procedure care. Your surgeon or physician assistant will discuss any changes with you.  Activity  You may drive the day following your surgery, if you are comfortable and no longer taking prescription pain medication. Resume full activity as the soreness in your incision resolves.  Bathing/Showering  You may shower after you go home. Keep your incision dry for 48 hours. Do not soak in a bathtub, hot tub, or swim until the incision heals completely. You may not shower if you have a hemodialysis catheter.  Incision Care  Clean your incision with mild soap and water after 48 hours. Pat the area dry with a clean towel. You do not need a bandage unless otherwise instructed. Do not apply any ointments or creams to your incision. You may have skin glue on your incision. Do not peel it off. It will come off on its own in about one week. Your arm may swell a bit after surgery. To reduce swelling use pillows to elevate your arm so it is above your heart. Your doctor will tell you if you need to lightly wrap your arm with an ACE bandage.  Diet  Resume your normal diet. There are not special food restrictions following this procedure. In order to heal from your surgery, it is CRITICAL to get adequate nutrition. Your body requires vitamins, minerals, and protein. Vegetables are the best source of vitamins and minerals. Vegetables also provide the perfect balance of protein. Processed food has little nutritional value, so try to avoid this.  Medications  Resume taking all of your medications. If your incision is causing pain, you may take over-the counter pain relievers such as acetaminophen (Tylenol). If you were prescribed a stronger pain medication, please be aware these medications can cause nausea and constipation. Prevent  nausea by taking the medication with a snack or meal. Avoid constipation by drinking plenty of fluids and eating foods with high amount of fiber, such as fruits, vegetables, and grains.  Do not take Tylenol if you are taking prescription pain medications.  Follow up Your surgeon may want to see you in the office following your access surgery. If so, this will be arranged at the time of your surgery.  Please call us immediately for any of the following conditions:  . Increased pain, redness, drainage (pus) from your incision site . Fever of 101 degrees or higher . Severe or worsening pain at your incision site . Hand pain or numbness. .  Reduce your risk of vascular disease:  . Stop smoking. If you would like help, call [facility] at 1-800-QUIT-NOW 505-593-8546) or [facility] at 902-196-7936  . Manage your cholesterol . Maintain a desired weight . Control your diabetes . Keep your blood pressure down  Dialysis  It will take several weeks to several months for your new dialysis access to be ready for use. Your surgeon will determine when it is okay to use it. Your nephrologist will continue to direct your dialysis. You can continue to use your Permcath until your new access is ready for use.   11/11/2019 Jeremy [name] [id number] [birth year]  Surgeon(s): [name], MD  Procedure(s): LEFT FOREARM RADIOCEPHALIC ARTERIOVENOUS (AV) FISTULA CREATION   May stick graft immediately   May stick graft on designated area only:    Do not stick 12 weeks    If you have  any questions, please call the office at [phone].

## 2019-11-11 NOTE — H&P (Signed)
History and Physical Interval Note:  11/11/2019 2:43 PM  Jeremy [name]  has presented today for surgery, with the diagnosis of END STAGE RENAL DISEASE.  The various methods of treatment have been discussed with the patient and family. After consideration of risks, benefits and other options for treatment, the patient has consented to  Procedure(s): ARTERIOVENOUS (AV) FISTULA CREATION (Left) as a surgical intervention.  The patient's history has been reviewed, patient examined, no change in status, stable for surgery.  I have reviewed the patient's chart and labs.  Questions were answered to the patient's satisfaction.    Left arm AVF  [name]  Patient name: Jeremy [name] MRN: [id number] DOB: Nov 08, 1965 Sex: male  REASON FOR CONSULT: Evaluate for permanent dialysis access  HPI:  Jeremy [name] is a 54 y.o. male, with history of diabetes, hypertension, hyperlipidemia, stage IV CKD now progressed to ESRD that presents for evaluation of permanent dialysis access. Patient states he is right-handed. He has never had a fistula or other access in the past. Currently dialyzing via right IJ tunneled catheter. States he has never had any access in the past. He currently is going to dialysis on Monday Wednesday and Friday. He has no numbness or tingling in his hands of either upper extremity. No chest wall implants other than RIJ tunneled catheter.      Past Medical History:  Diagnosis Date  . Abscess   . AKI (acute kidney injury) ([place]) 08/05/2017  . Bell's palsy   . CKD (chronic kidney disease)    Dr. McDiarmind   . High cholesterol   . Hypertension   . Left shoulder pain 10/26/2013   S/p injection on 10/26/13   . Renal insufficiency 02/14/2014  . Seizures ([place]) 08/04/2017   "related to high blood sugars" (08/05/2017)  . Type II diabetes mellitus ([place])         Past Surgical History:  Procedure Laterality Date  . Eyelid surgery Right   . I & D EXTREMITY  11/11/2011   Procedure: IRRIGATION  AND DEBRIDEMENT EXTREMITY; Surgeon: Merrie Roof, MD; Location: [facility]; Service: General; Laterality: Right; I&D Right Thigh Abscess  . IR FLUORO GUIDE CV LINE RIGHT  08/12/2019  . IR US GUIDE VASC ACCESS RIGHT  08/12/2019        Family History  Problem Relation Age of Onset  . Diabetes Mother   . Heart disease Mother 12  . Diabetes Sister   . Diabetes Brother   . Diabetes Brother   . Cancer Maternal Uncle    type unknown  . Stroke Neg Hx    SOCIAL HISTORY:  Social History        Socioeconomic History  . Marital status: Divorced    Spouse name: Not on file  . Number of children: 3  . Years of education: Not on file  . Highest education level: Not on file  Occupational History  . Occupation: maintenance tech  Tobacco Use  . Smoking status: Never Smoker  . Smokeless tobacco: Never Used  Substance and Sexual Activity  . Alcohol use: Not Currently    Comment: rarely-once or twice a year  . Drug use: No  . Sexual activity: Yes  Other Topics Concern  . 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 Did not graduate high school. Has 3 adult children in [place]. Divorced. Lives with girlfriend Jeremy [name]).  Social Determinants of Health      Financial Resource Strain:   . Difficulty of Paying Living Expenses:   Food Insecurity:   . Worried About Charity fundraiser in the Last Year:   . Arboriculturist in the Last Year:   Transportation Needs:   . Film/video editor (Medical):   Marland Kitchen Lack of Transportation (Non-Medical):   Physical Activity: Unknown  . Days of Exercise per Week: 2 days  . Minutes of Exercise per Session: Not on file  Stress:   . Feeling of Stress :   Social Connections:   . Frequency of Communication with Friends and Family:   . Frequency of Social Gatherings with Friends and Family:   . Attends Religious Services:   . Active Member of Clubs or Organizations:    . Attends Archivist Meetings:   Marland Kitchen Marital Status:   Intimate Partner Violence:   . Fear of Current or Ex-Partner:   . Emotionally Abused:   Marland Kitchen Physically Abused:   . Sexually Abused:    No Known Allergies        Current Outpatient Medications  Medication Sig Dispense Refill  . amLODipine (NORVASC) 10 MG tablet Take 1 tablet (10 mg total) by mouth daily. 30 tablet 0  . aspirin EC 81 MG tablet Take 1 tablet (81 mg total) by mouth daily. 90 tablet 3  . atorvastatin (LIPITOR) 40 MG tablet Take 1 tablet (40 mg total) by mouth daily. 30 tablet 0  . Blood Glucose Monitoring Suppl (CONTOUR NEXT MONITOR) w/Device KIT 1 Device by Does not apply route 4 (four) times daily - after meals and at bedtime. 1 kit 0  . carvedilol (COREG) 6.25 MG tablet Take 1 tablet (6.25 mg total) by mouth 2 (two) times daily with a meal. 60 tablet 0  . enalapril (VASOTEC) 5 MG tablet Take 1 tablet (5 mg total) by mouth daily. 30 tablet 0  . furosemide (LASIX) 80 MG tablet Take 1 tablet (80 mg total) by mouth 2 (two) times daily. 60 tablet 0  . glucose blood test strip Use as instructed 100 each 12  . hydrALAZINE (APRESOLINE) 25 MG tablet Take 1 tablet (25 mg total) by mouth 3 (three) times daily. 90 tablet 0  . insulin lispro (HUMALOG KWIKPEN) 100 UNIT/ML KwikPen Inject 0.1-0.15 mLs (10-15 Units total) into the skin 2 (two) times daily before a meal. 15 mL   . Insulin Pen Needle (B-D ULTRAFINE III SHORT PEN) 31G X 8 MM MISC 1 application by Does not apply route 4 (four) times daily. 100 each 11  . Lancet Devices (MICROLET NEXT LANCING DEVICE) MISC 1 Device by Does not apply route 3 (three) times daily. 1 each 11  . Levetiracetam 750 MG TB24 Take 2 tablets (1,500 mg total) by mouth daily. 60 tablet 0  . Microlet Lancets MISC 1 Device by Does not apply route 3 (three) times daily. 100 each 11  . polyethylene glycol powder (GLYCOLAX/MIRALAX) powder Take 17 g (or 1 tablespoon) daily mixed in water. 255 g 0  .  Semaglutide,0.25 or 0.5MG/DOS, (OZEMPIC, 0.25 OR 0.5 MG/DOSE,) 2 MG/1.5ML SOPN Inject 0.375 mg into the skin once a week. 8.85 mg plus 5 clicks 3 pen 0   No current facility-administered medications for this visit.   REVIEW OF SYSTEMS:  _0  denotes positive finding, _1  denotes negative finding  Cardiac  Comments:  Chest pain or chest pressure:    Shortness of breath upon exertion:  Short of breath when lying flat:    Irregular heart rhythm:        Vascular    Pain in calf, thigh, or hip brought on by ambulation:    Pain in feet at night that wakes you up from your sleep:     Blood clot in your veins:    Leg swelling:         Pulmonary    Oxygen at home:    Productive cough:     Wheezing:         Neurologic    Sudden weakness in arms or legs:     Sudden numbness in arms or legs:     Sudden onset of difficulty speaking or slurred speech:    Temporary loss of vision in one eye:     Problems with dizziness:         Gastrointestinal    Blood in stool:     Vomited blood:         Genitourinary    Burning when urinating:     Blood in urine:        Psychiatric    Major depression:         Hematologic    Bleeding problems:    Problems with blood clotting too easily:        Skin    Rashes or ulcers:        Constitutional    Fever or chills:    PHYSICAL EXAM:     Vitals:   11/09/19 0852  BP: 121/73  Pulse: 75  Resp: 18  Temp: (!) 97.3 F (36.3 C)  TempSrc: Temporal  SpO2: 100%  Weight: 294 lb (133.4 kg)  Height: _0  (1.727 m)   GENERAL: The patient is a well-nourished male, in no acute distress. The vital signs are documented above.  CARDIAC: There is a regular rate and rhythm.  VASCULAR:  Palpable radial and brachial pulses bilateral upper extremities  RIJ tunneled catheter  PULMONARY: There is good air exchange bilaterally without wheezing or rales.  ABDOMEN: Soft and non-tender with normal pitched bowel sounds.  MUSCULOSKELETAL: There are no major  deformities or cyanosis.  NEUROLOGIC: No focal weakness or paresthesias are detected.  SKIN: There are no ulcers or rashes noted.  PSYCHIATRIC: The patient has a normal affect.  DATA:  I independently reviewed arterial duplex upper extremities and triphasic waveforms in bilateral upper extremities  On vein mapping appears to have usable cephalic and basilic veins in bilateral upper extremities. On the left which is his nondominant arm appears to have a nice cephalic vein down to the wrist.  Assessment/Plan:  54 year old male with diabetes hypertension hyperlipidemia now ESRD on dialysis Monday Wednesday and Friday that presents for evaluation of permanent dialysis access. Patient is right-handed and discussed plans to place access in the nondominant arm which would be his left arm. Reviewed that vein mapping he has a nice cephalic vein down to the wrist and I would be hopeful to do a radiocephalic fistula at the wrist but some chance it would need to be at the elbow. I discussed the steps of surgery in detail as well as risk and benefits including failure to mature, steal, bleeding, infection, etc. We will schedule on a nondialysis day in the near future.  [name], MD  [facility] [place]  Office: [phone]

## 2019-11-11 NOTE — Op Note (Signed)
OPERATIVE NOTE   PROCEDURE: left radiocephalic arteriovenous fistula placement  PRE-OPERATIVE DIAGNOSIS: Stage IV CKD  POST-OPERATIVE DIAGNOSIS: same  SURGEON: Monica Martinez, MD  ASSISTANT(S): Paulo Fruit, PA  ANESTHESIA: MAC  ESTIMATED BLOOD LOSS: Minimal  FINDING(S): 1.  Cephalic vein: 3.0 mm, acceptable 2.  Radial artery: 2.5 mm, disease free 3.  Venous outflow: palpable thrill  4.  Radial flow: palpable radial pulse  SPECIMEN(S):  none  INDICATIONS:   Jeremy [name] is a 54 y.o. male who presents with CKD and need for permanent hemodialysis access.  The patient is scheduled for left radiocephalic arteriovenous fistula placement.  The patient is aware the risks include but are not limited to: bleeding, infection, steal syndrome, nerve damage, ischemic monomelic neuropathy, failure to mature, and need for additional procedures.  The patient is aware of the risks of the procedure and elects to proceed forward.  DESCRIPTION: After full informed written consent was obtained from the patient, the patient was brought back to the operating room and placed supine upon the operating table.  Prior to induction, the patient received IV antibiotics.   After obtaining adequate anesthesia, the patient was then prepped and draped in the standard fashion for a left arm access procedure.   I turned my attention first to identifying the patient's distal cephalic vein and radial artery.  Using SonoSite guidance, the location of these vessels were marked out on the skin.   At this point, I injected local anesthetic to obtain a field block of the wrist.  In total, I injected about 5 mL of 1% lidocaine without epinephrine.  I made a longitudinal incision at the level of the wrist and dissected through the subcutaneous tissue and fascia to gain exposure of the radial artery.  This was noted to be 2.5 mm in diameter externally.  This was dissected out proximally and distally and controlled with  vessel loops .  I then dissected out the cephalic vein.  All side branches were dissected out and ligated between 4-0 silk ties.  This was noted to be 3 mm in diameter externally.  The distal segment of the vein was ligated with a  2-0 silk, and the vein was transected.  The proximal segment was interrogated with serial dilators.  The vein accepted up to a 4 mm dilator without any difficulty.  I then instilled the heparinized saline into the vein and clamped it.  At this point, I reset my exposure of the radial artery.  The patient was given 3000 units IV heparin.  I placed the artery under tension proximally and distally.  I made an arteriotomy with a #11 blade, and then I extended the arteriotomy with a Potts scissor.  I injected heparinized saline proximal and distal to this arteriotomy.  The vein was then sewn to the artery in an end-to-side configuration with a running stitch of 6-0 Prolene.  Prior to completing this anastomosis, I allowed the vein and artery to backbleed.  There was no evidence of clot from any vessels.  I completed the anastomosis in the usual fashion and then released all vessel loops and clamps.    There was a palpable thrill in the venous outflow, and there was a palpable radial pulse.  At this point, I irrigated out the surgical wound.  There was no further active bleeding.  The subcutaneous tissue was reapproximated with a running stitch of 3-0 Vicryl.  The skin was then reapproximated with a running subcuticular stitch of 4-0 Monocryl.  The skin was then cleaned, dried, and reinforced with Dermabond.  The patient tolerated this procedure well.   COMPLICATIONS: None  CONDITION: Stable   Monica Martinez, MD [facility] [place] Office: [phone]  11/11/2019, 4:17 PM

## 2019-11-12 DIAGNOSIS — Z992 Dependence on renal dialysis: Secondary | ICD-10-CM | POA: Diagnosis not present

## 2019-11-12 DIAGNOSIS — T8249XA Other complication of vascular dialysis catheter, initial encounter: Secondary | ICD-10-CM | POA: Diagnosis not present

## 2019-11-12 DIAGNOSIS — N2581 Secondary hyperparathyroidism of renal origin: Secondary | ICD-10-CM | POA: Diagnosis not present

## 2019-11-12 DIAGNOSIS — N186 End stage renal disease: Secondary | ICD-10-CM | POA: Diagnosis not present

## 2019-11-15 DIAGNOSIS — N186 End stage renal disease: Secondary | ICD-10-CM | POA: Diagnosis not present

## 2019-11-15 DIAGNOSIS — T8249XA Other complication of vascular dialysis catheter, initial encounter: Secondary | ICD-10-CM | POA: Diagnosis not present

## 2019-11-15 DIAGNOSIS — Z992 Dependence on renal dialysis: Secondary | ICD-10-CM | POA: Diagnosis not present

## 2019-11-15 DIAGNOSIS — N2581 Secondary hyperparathyroidism of renal origin: Secondary | ICD-10-CM | POA: Diagnosis not present

## 2019-11-17 DIAGNOSIS — N2581 Secondary hyperparathyroidism of renal origin: Secondary | ICD-10-CM | POA: Diagnosis not present

## 2019-11-17 DIAGNOSIS — Z992 Dependence on renal dialysis: Secondary | ICD-10-CM | POA: Diagnosis not present

## 2019-11-17 DIAGNOSIS — Z1211 Encounter for screening for malignant neoplasm of colon: Secondary | ICD-10-CM | POA: Diagnosis not present

## 2019-11-17 DIAGNOSIS — T8249XA Other complication of vascular dialysis catheter, initial encounter: Secondary | ICD-10-CM | POA: Diagnosis not present

## 2019-11-17 DIAGNOSIS — N186 End stage renal disease: Secondary | ICD-10-CM | POA: Diagnosis not present

## 2019-11-18 LAB — FECAL OCCULT BLOOD, IMMUNOCHEMICAL: Fecal Occult Bld: NEGATIVE

## 2019-11-19 DIAGNOSIS — N186 End stage renal disease: Secondary | ICD-10-CM | POA: Diagnosis not present

## 2019-11-19 DIAGNOSIS — N2581 Secondary hyperparathyroidism of renal origin: Secondary | ICD-10-CM | POA: Diagnosis not present

## 2019-11-19 DIAGNOSIS — I129 Hypertensive chronic kidney disease with stage 1 through stage 4 chronic kidney disease, or unspecified chronic kidney disease: Secondary | ICD-10-CM | POA: Diagnosis not present

## 2019-11-19 DIAGNOSIS — Z992 Dependence on renal dialysis: Secondary | ICD-10-CM | POA: Diagnosis not present

## 2019-11-19 DIAGNOSIS — T8249XA Other complication of vascular dialysis catheter, initial encounter: Secondary | ICD-10-CM | POA: Diagnosis not present

## 2019-11-22 ENCOUNTER — Other Ambulatory Visit (HOSPITAL_COMMUNITY)
Admission: RE | Admit: 2019-11-22 | Discharge: 2019-11-22 | Disposition: A | Discharge status: Home / Self Care | Payer: Medicare Other | Source: Ambulatory Visit | Attending: Nurse Practitioner | Admitting: Nurse Practitioner

## 2019-11-22 ENCOUNTER — Ambulatory Visit: Payer: Medicare Other

## 2019-11-22 ENCOUNTER — Other Ambulatory Visit: Payer: Self-pay

## 2019-11-22 DIAGNOSIS — N2581 Secondary hyperparathyroidism of renal origin: Secondary | ICD-10-CM | POA: Diagnosis not present

## 2019-11-22 DIAGNOSIS — R972 Elevated prostate specific antigen [PSA]: Secondary | ICD-10-CM | POA: Insufficient documentation

## 2019-11-22 DIAGNOSIS — Z992 Dependence on renal dialysis: Secondary | ICD-10-CM | POA: Diagnosis not present

## 2019-11-22 DIAGNOSIS — Z114 Encounter for screening for human immunodeficiency virus [HIV]: Secondary | ICD-10-CM | POA: Diagnosis not present

## 2019-11-22 DIAGNOSIS — N186 End stage renal disease: Secondary | ICD-10-CM | POA: Diagnosis not present

## 2019-11-22 DIAGNOSIS — T8249XA Other complication of vascular dialysis catheter, initial encounter: Secondary | ICD-10-CM | POA: Diagnosis not present

## 2019-11-23 LAB — MICROSCOPIC EXAMINATION
Bacteria, UA: NONE SEEN
RBC, Urine: NONE SEEN /hpf (ref 0–2)

## 2019-11-23 LAB — URINALYSIS, ROUTINE W REFLEX MICROSCOPIC
Bilirubin, UA: NEGATIVE
Glucose, UA: NEGATIVE
Leukocytes,UA: NEGATIVE
Nitrite, UA: NEGATIVE
Specific Gravity, UA: 1.02 (ref 1.005–1.030)
Urobilinogen, Ur: 0.2 mg/dL (ref 0.2–1.0)
pH, UA: 6 (ref 5.0–7.5)

## 2019-11-23 LAB — HIV ANTIBODY (ROUTINE TESTING W REFLEX): HIV Screen 4th Generation wRfx: NONREACTIVE

## 2019-11-24 DIAGNOSIS — T8249XA Other complication of vascular dialysis catheter, initial encounter: Secondary | ICD-10-CM | POA: Diagnosis not present

## 2019-11-24 DIAGNOSIS — N2581 Secondary hyperparathyroidism of renal origin: Secondary | ICD-10-CM | POA: Diagnosis not present

## 2019-11-24 DIAGNOSIS — N186 End stage renal disease: Secondary | ICD-10-CM | POA: Diagnosis not present

## 2019-11-24 DIAGNOSIS — Z992 Dependence on renal dialysis: Secondary | ICD-10-CM | POA: Diagnosis not present

## 2019-11-25 ENCOUNTER — Encounter: Payer: Self-pay | Admitting: Internal Medicine

## 2019-11-25 ENCOUNTER — Other Ambulatory Visit: Payer: Self-pay | Admitting: Family Medicine

## 2019-11-25 LAB — URINE CYTOLOGY ANCILLARY ONLY
Bacterial Vaginitis-Urine: NEGATIVE
Candida Urine: NEGATIVE
Chlamydia: NEGATIVE
Comment: NEGATIVE
Comment: NEGATIVE
Comment: NORMAL
Neisseria Gonorrhea: NEGATIVE
Trichomonas: NEGATIVE

## 2019-11-26 DIAGNOSIS — T8249XA Other complication of vascular dialysis catheter, initial encounter: Secondary | ICD-10-CM | POA: Diagnosis not present

## 2019-11-26 DIAGNOSIS — N2581 Secondary hyperparathyroidism of renal origin: Secondary | ICD-10-CM | POA: Diagnosis not present

## 2019-11-26 DIAGNOSIS — Z992 Dependence on renal dialysis: Secondary | ICD-10-CM | POA: Diagnosis not present

## 2019-11-26 DIAGNOSIS — N186 End stage renal disease: Secondary | ICD-10-CM | POA: Diagnosis not present

## 2019-11-29 DIAGNOSIS — T8249XA Other complication of vascular dialysis catheter, initial encounter: Secondary | ICD-10-CM | POA: Diagnosis not present

## 2019-11-29 DIAGNOSIS — Z992 Dependence on renal dialysis: Secondary | ICD-10-CM | POA: Diagnosis not present

## 2019-11-29 DIAGNOSIS — N186 End stage renal disease: Secondary | ICD-10-CM | POA: Diagnosis not present

## 2019-11-29 DIAGNOSIS — N2581 Secondary hyperparathyroidism of renal origin: Secondary | ICD-10-CM | POA: Diagnosis not present

## 2019-11-30 ENCOUNTER — Encounter: Payer: Self-pay | Admitting: Nurse Practitioner

## 2019-11-30 ENCOUNTER — Ambulatory Visit: Payer: BC Managed Care – PPO | Attending: Nurse Practitioner | Admitting: Nurse Practitioner

## 2019-11-30 ENCOUNTER — Other Ambulatory Visit: Payer: Self-pay

## 2019-11-30 DIAGNOSIS — Z760 Encounter for issue of repeat prescription: Secondary | ICD-10-CM | POA: Diagnosis not present

## 2019-11-30 DIAGNOSIS — Z7982 Long term (current) use of aspirin: Secondary | ICD-10-CM | POA: Diagnosis not present

## 2019-11-30 DIAGNOSIS — Z794 Long term (current) use of insulin: Secondary | ICD-10-CM | POA: Diagnosis not present

## 2019-11-30 DIAGNOSIS — I10 Essential (primary) hypertension: Secondary | ICD-10-CM

## 2019-11-30 DIAGNOSIS — Z79899 Other long term (current) drug therapy: Secondary | ICD-10-CM | POA: Insufficient documentation

## 2019-11-30 DIAGNOSIS — Z7901 Long term (current) use of anticoagulants: Secondary | ICD-10-CM | POA: Diagnosis not present

## 2019-11-30 DIAGNOSIS — N186 End stage renal disease: Secondary | ICD-10-CM | POA: Diagnosis not present

## 2019-11-30 DIAGNOSIS — E1121 Type 2 diabetes mellitus with diabetic nephropathy: Secondary | ICD-10-CM

## 2019-11-30 DIAGNOSIS — E7800 Pure hypercholesterolemia, unspecified: Secondary | ICD-10-CM | POA: Insufficient documentation

## 2019-11-30 DIAGNOSIS — E1169 Type 2 diabetes mellitus with other specified complication: Secondary | ICD-10-CM | POA: Diagnosis not present

## 2019-11-30 DIAGNOSIS — E1122 Type 2 diabetes mellitus with diabetic chronic kidney disease: Secondary | ICD-10-CM | POA: Diagnosis not present

## 2019-11-30 DIAGNOSIS — Z8249 Family history of ischemic heart disease and other diseases of the circulatory system: Secondary | ICD-10-CM | POA: Insufficient documentation

## 2019-11-30 DIAGNOSIS — R569 Unspecified convulsions: Secondary | ICD-10-CM | POA: Diagnosis not present

## 2019-11-30 DIAGNOSIS — E782 Mixed hyperlipidemia: Secondary | ICD-10-CM | POA: Insufficient documentation

## 2019-11-30 DIAGNOSIS — I120 Hypertensive chronic kidney disease with stage 5 chronic kidney disease or end stage renal disease: Secondary | ICD-10-CM | POA: Diagnosis not present

## 2019-11-30 DIAGNOSIS — IMO0002 Reserved for concepts with insufficient information to code with codable children: Secondary | ICD-10-CM

## 2019-11-30 DIAGNOSIS — E1165 Type 2 diabetes mellitus with hyperglycemia: Secondary | ICD-10-CM

## 2019-11-30 DIAGNOSIS — E785 Hyperlipidemia, unspecified: Secondary | ICD-10-CM | POA: Diagnosis not present

## 2019-11-30 MED ORDER — BD PEN NEEDLE SHORT U/F 31G X 8 MM MISC: 1.0000 "application " | Freq: Three times a day (TID) | 11 refills | Status: DC

## 2019-11-30 MED ORDER — OZEMPIC (0.25 OR 0.5 MG/DOSE) 2 MG/1.5ML ~~LOC~~ SOPN: 0.3750 mg | PEN_INJECTOR | SUBCUTANEOUS | 6 refills | Status: DC

## 2019-11-30 MED ORDER — LEVETIRACETAM ER 750 MG PO TB24: 1500.0000 mg | ORAL_TABLET | Freq: Every day | ORAL | 0 refills | Status: DC

## 2019-11-30 MED ORDER — INSULIN LISPRO (1 UNIT DIAL) 100 UNIT/ML (KWIKPEN): 10.0000 [IU] | PEN_INJECTOR | Freq: Two times a day (BID) | SUBCUTANEOUS | 6 refills | Status: DC

## 2019-11-30 MED ORDER — ASPIRIN EC 81 MG PO TBEC: 81.0000 mg | DELAYED_RELEASE_TABLET | Freq: Every day | ORAL | 3 refills | Status: DC

## 2019-11-30 MED ORDER — ATORVASTATIN CALCIUM 40 MG PO TABS: 40.0000 mg | ORAL_TABLET | Freq: Every day | ORAL | 0 refills | Status: DC

## 2019-11-30 MED ORDER — GLUCOSE BLOOD VI STRP: ORAL_STRIP | 12 refills | Status: DC

## 2019-11-30 MED ORDER — ENALAPRIL MALEATE 5 MG PO TABS: 5.0000 mg | ORAL_TABLET | Freq: Every day | ORAL | 0 refills | Status: DC

## 2019-11-30 MED ORDER — MICROLET NEXT LANCING DEVICE MISC: 1.0000 | Freq: Three times a day (TID) | 11 refills | Status: AC

## 2019-11-30 MED ORDER — HYDRALAZINE HCL 25 MG PO TABS: 25.0000 mg | ORAL_TABLET | Freq: Three times a day (TID) | ORAL | 0 refills | Status: DC

## 2019-11-30 MED ORDER — MICROLET LANCETS MISC: 1.0000 | Freq: Three times a day (TID) | 11 refills | Status: DC

## 2019-11-30 MED ORDER — CARVEDILOL 6.25 MG PO TABS: 6.2500 mg | ORAL_TABLET | Freq: Two times a day (BID) | ORAL | 0 refills | Status: DC

## 2019-12-01 DIAGNOSIS — N186 End stage renal disease: Secondary | ICD-10-CM | POA: Diagnosis not present

## 2019-12-01 DIAGNOSIS — Z992 Dependence on renal dialysis: Secondary | ICD-10-CM | POA: Diagnosis not present

## 2019-12-01 DIAGNOSIS — T8249XA Other complication of vascular dialysis catheter, initial encounter: Secondary | ICD-10-CM | POA: Diagnosis not present

## 2019-12-01 DIAGNOSIS — N2581 Secondary hyperparathyroidism of renal origin: Secondary | ICD-10-CM | POA: Diagnosis not present

## 2019-12-01 NOTE — Progress Notes (Signed)
Virtual Visit via Telephone Note Due to national recommendations of social distancing due to [place] 19, telehealth visit is felt to be most appropriate for this patient at this time.  I discussed the limitations, risks, security and privacy concerns of performing an evaluation and management service by telephone and the availability of in person appointments. I also discussed with the patient that there may be a patient responsible charge related to this service. The patient expressed understanding and agreed to proceed.    I connected with [name] on 12/01/19  at   8:50 AM EDT  EDT by telephone and verified that I am speaking with the correct person using two identifiers.   Consent I discussed the limitations, risks, security and privacy concerns of performing an evaluation and management service by telephone and the availability of in person appointments. I also discussed with the patient that there may be a patient responsible charge related to this service. The patient expressed understanding and agreed to proceed.   Location of Patient: Private Residence    Location of Provider: [facility] participating in Telemedicine visit: [name] FNP-BC [facility]    History of Present Illness: Telemedicine visit for: F/U He is currently on HD for ESRD.  He sees Neurology for his history of seizures. Next appt 7.19.2021  Essential Hypertension Well controlled. He endorses adherence taking carvedilol 6.25 mg BID, enalapril 5 mg daily, hydralazine 25 mg TID. He has not picked up his lasix. Does not monitor blood pressure at home.   BP Readings from Last 3 Encounters:  11/11/19 138/83  11/09/19 121/73  08/26/19 140/74    DM TYPE 2 Not well controlled however improving.  He is taking ozempic 0.375 mg weekly and  Humalog SSI 10-15 units TID. On ACE and STATIN. LDL not at goal. Currently taking atorvastatin 40 mg daily. Post  prandial readings 190-200s.  Lab Results  Component Value Date   HGBA1C 8.1 (H) 11/09/2019   Lab Results  Component Value Date   Gibson 80 11/09/2019    Past Medical History:  Diagnosis Date  . Abscess   . AKI (acute kidney injury) ([place]) 08/05/2017  . Bell's palsy   . CKD (chronic kidney disease)    Dr. McDiarmind   . Headache   . High cholesterol   . Hypertension   . Left shoulder pain 10/26/2013   S/p injection on 10/26/13   . Renal insufficiency 02/14/2014  . Seizures ([place]) 08/04/2017   "related to high blood sugars" (08/05/2017)  . Type II diabetes mellitus ([place])     Past Surgical History:  Procedure Laterality Date  . AV FISTULA PLACEMENT Left 11/11/2019   Procedure: LEFT FOREARM RADIOCEPHALIC ARTERIOVENOUS (AV) FISTULA CREATION;  Surgeon: [name], MD;  Location: [facility];  Service: Vascular;  Laterality: Left;  . CHOLECYSTECTOMY    . Eyelid surgery Right   . I & D EXTREMITY  11/11/2011   Procedure: IRRIGATION AND DEBRIDEMENT EXTREMITY;  Surgeon: Merrie Roof, MD;  Location: [facility];  Service: General;  Laterality: Right;  I&D Right Thigh Abscess  . IR FLUORO GUIDE CV LINE RIGHT  08/12/2019  . IR US GUIDE VASC ACCESS RIGHT  08/12/2019    Family History  Problem Relation Age of Onset  . Diabetes Mother   . Heart disease Mother 31  . Diabetes Sister   . Diabetes Brother   . Diabetes Brother   . Cancer Maternal Uncle  type unknown  . Stroke Neg Hx     Social History   Socioeconomic History  . Marital status: Divorced    Spouse name: Not on file  . Number of children: 3  . Years of education: Not on file  . Highest education level: Not on file  Occupational History  . Occupation: maintenance tech  Tobacco Use  . Smoking status: Never Smoker  . Smokeless tobacco: Never Used  Substance and Sexual Activity  . Alcohol use: Not Currently  . Drug use: No  . Sexual activity: Yes  Other Topics Concern  . 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  Did not graduate high school.  Has 3 adult children in [place].  Divorced.  Lives with girlfriend [name].                   Social Determinants of Health   Financial Resource Strain:   . Difficulty of Paying Living Expenses:   Food Insecurity:   . Worried About Charity fundraiser in the Last Year:   . Arboriculturist in the Last Year:   Transportation Needs:   . Film/video editor (Medical):   Marland Kitchen Lack of Transportation (Non-Medical):   Physical Activity: Unknown  . Days of Exercise per Week: 2 days  . Minutes of Exercise per Session: Not on file  Stress:   . Feeling of Stress :   Social Connections:   . Frequency of Communication with Friends and Family:   . Frequency of Social Gatherings with Friends and Family:   . Attends Religious Services:   . Active Member of Clubs or Organizations:   . Attends Archivist Meetings:   Marland Kitchen Marital Status:      Observations/Objective: Awake, alert and oriented x 3   Review of Systems  Constitutional: Negative for fever, malaise/fatigue and weight loss.  HENT: Negative.  Negative for nosebleeds.   Eyes: Negative.  Negative for blurred vision, double vision and photophobia.  Respiratory: Negative.  Negative for cough and shortness of breath.   Cardiovascular: Negative.  Negative for chest pain, palpitations and leg swelling.  Gastrointestinal: Negative.  Negative for heartburn, nausea and vomiting.  Musculoskeletal: Negative.  Negative for myalgias.  Neurological: Negative.  Negative for dizziness, focal weakness, seizures and headaches.  Psychiatric/Behavioral: Negative.  Negative for suicidal ideas.    Assessment and Plan: Laray was seen today for medication refill.  Diagnoses and all orders for this visit:  Essential hypertension -     carvedilol (COREG) 6.25 MG tablet; Take 1 tablet (6.25 mg total) by mouth 2 (two) times daily with a  meal. -     atorvastatin (LIPITOR) 40 MG tablet; Take 1 tablet (40 mg total) by mouth daily. -     enalapril (VASOTEC) 5 MG tablet; Take 1 tablet (5 mg total) by mouth daily. -     hydrALAZINE (APRESOLINE) 25 MG tablet; Take 1 tablet (25 mg total) by mouth 3 (three) times daily. Continue all antihypertensives as prescribed.  Remember to bring in your blood pressure log with you for your follow up appointment.  DASH/Mediterranean Diets are healthier choices for HTN.   Mixed hyperlipidemia due to type 2 diabetes mellitus (HCC) -     atorvastatin (LIPITOR) 40 MG tablet; Take 1 tablet (40 mg total) by mouth daily. INSTRUCTIONS: Work on a low fat, heart healthy diet and participate in regular aerobic exercise  program by working out at least 150 minutes per week; 5 days a week-30 minutes per day. Avoid red meat/beef/steak,  fried foods. junk foods, sodas, sugary drinks, unhealthy snacking, alcohol and smoking.  Drink at least 80 oz of water per day and monitor your carbohydrate intake daily.    Uncontrolled type 2 diabetes mellitus with diabetic nephropathy, with long-term current use of insulin (HCC) -     insulin lispro (HUMALOG KWIKPEN) 100 UNIT/ML KwikPen; Inject 0.1-0.15 mLs (10-15 Units total) into the skin 2 (two) times daily before a meal. -     glucose blood test strip; Use as instructed. Inject into the skin 3 times daily E11.65 -     Insulin Pen Needle (B-D ULTRAFINE III SHORT PEN) 31G X 8 MM MISC; 1 application by Does not apply route 3 (three) times daily. E11.65 -     Lancet Devices (MICROLET NEXT LANCING DEVICE) MISC; 1 Device by Does not apply route 3 (three) times daily. -     Microlet Lancets MISC; 1 Device by Does not apply route 3 (three) times daily. E11.65 -     Semaglutide,0.25 or 0.5MG /DOS, (OZEMPIC, 0.25 OR 0.5 MG/DOSE,) 2 MG/1.5ML SOPN; Inject 0.375 mg into the skin once a week. 3.25 mg plus 5 clicks Continue blood sugar control as discussed in office today, low carbohydrate  diet, and regular physical exercise as tolerated, 150 minutes per week (30 min each day, 5 days per week, or 50 min 3 days per week). Keep blood sugar logs with fasting goal of 90-130 mg/dl, post prandial (after you eat) less than 180.  For Hypoglycemia: BS <60 and Hyperglycemia BS >400; contact the clinic ASAP. Annual eye exams and foot exams are recommended.   Seizure ([place]) -     Levetiracetam 750 MG TB24; Take 2 tablets (1,500 mg total) by mouth daily.  Other orders -     aspirin EC 81 MG tablet; Take 1 tablet (81 mg total) by mouth daily.     Follow Up Instructions No follow-ups on file.     I discussed the assessment and treatment plan with the patient. The patient was provided an opportunity to ask questions and all were answered. The patient agreed with the plan and demonstrated an understanding of the instructions.   The patient was advised to call back or seek an in-person evaluation if the symptoms worsen or if the condition fails to improve as anticipated.  I provided 14 minutes of non-face-to-face time during this encounter including median intraservice time, reviewing previous notes, labs, imaging, medications and explaining diagnosis and management.  Gildardo Pounds, FNP-BC

## 2019-12-02 DIAGNOSIS — E113211 Type 2 diabetes mellitus with mild nonproliferative diabetic retinopathy with macular edema, right eye: Secondary | ICD-10-CM | POA: Diagnosis not present

## 2019-12-02 DIAGNOSIS — E113213 Type 2 diabetes mellitus with mild nonproliferative diabetic retinopathy with macular edema, bilateral: Secondary | ICD-10-CM | POA: Diagnosis not present

## 2019-12-03 ENCOUNTER — Other Ambulatory Visit: Payer: Self-pay | Admitting: Nurse Practitioner

## 2019-12-03 DIAGNOSIS — N186 End stage renal disease: Secondary | ICD-10-CM | POA: Diagnosis not present

## 2019-12-03 DIAGNOSIS — T8249XA Other complication of vascular dialysis catheter, initial encounter: Secondary | ICD-10-CM | POA: Diagnosis not present

## 2019-12-03 DIAGNOSIS — N2581 Secondary hyperparathyroidism of renal origin: Secondary | ICD-10-CM | POA: Diagnosis not present

## 2019-12-03 DIAGNOSIS — Z992 Dependence on renal dialysis: Secondary | ICD-10-CM | POA: Diagnosis not present

## 2019-12-06 DIAGNOSIS — Z992 Dependence on renal dialysis: Secondary | ICD-10-CM | POA: Diagnosis not present

## 2019-12-06 DIAGNOSIS — N2581 Secondary hyperparathyroidism of renal origin: Secondary | ICD-10-CM | POA: Diagnosis not present

## 2019-12-06 DIAGNOSIS — T8249XA Other complication of vascular dialysis catheter, initial encounter: Secondary | ICD-10-CM | POA: Diagnosis not present

## 2019-12-06 DIAGNOSIS — N186 End stage renal disease: Secondary | ICD-10-CM | POA: Diagnosis not present

## 2019-12-08 DIAGNOSIS — N2581 Secondary hyperparathyroidism of renal origin: Secondary | ICD-10-CM | POA: Diagnosis not present

## 2019-12-08 DIAGNOSIS — N186 End stage renal disease: Secondary | ICD-10-CM | POA: Diagnosis not present

## 2019-12-08 DIAGNOSIS — Z992 Dependence on renal dialysis: Secondary | ICD-10-CM | POA: Diagnosis not present

## 2019-12-08 DIAGNOSIS — T8249XA Other complication of vascular dialysis catheter, initial encounter: Secondary | ICD-10-CM | POA: Diagnosis not present

## 2019-12-10 DIAGNOSIS — T8249XA Other complication of vascular dialysis catheter, initial encounter: Secondary | ICD-10-CM | POA: Diagnosis not present

## 2019-12-10 DIAGNOSIS — Z992 Dependence on renal dialysis: Secondary | ICD-10-CM | POA: Diagnosis not present

## 2019-12-10 DIAGNOSIS — N2581 Secondary hyperparathyroidism of renal origin: Secondary | ICD-10-CM | POA: Diagnosis not present

## 2019-12-10 DIAGNOSIS — N186 End stage renal disease: Secondary | ICD-10-CM | POA: Diagnosis not present

## 2019-12-13 DIAGNOSIS — T8249XA Other complication of vascular dialysis catheter, initial encounter: Secondary | ICD-10-CM | POA: Diagnosis not present

## 2019-12-13 DIAGNOSIS — N186 End stage renal disease: Secondary | ICD-10-CM | POA: Diagnosis not present

## 2019-12-13 DIAGNOSIS — Z992 Dependence on renal dialysis: Secondary | ICD-10-CM | POA: Diagnosis not present

## 2019-12-13 DIAGNOSIS — N2581 Secondary hyperparathyroidism of renal origin: Secondary | ICD-10-CM | POA: Diagnosis not present

## 2019-12-15 DIAGNOSIS — N186 End stage renal disease: Secondary | ICD-10-CM | POA: Diagnosis not present

## 2019-12-15 DIAGNOSIS — T8249XA Other complication of vascular dialysis catheter, initial encounter: Secondary | ICD-10-CM | POA: Diagnosis not present

## 2019-12-15 DIAGNOSIS — N2581 Secondary hyperparathyroidism of renal origin: Secondary | ICD-10-CM | POA: Diagnosis not present

## 2019-12-15 DIAGNOSIS — Z992 Dependence on renal dialysis: Secondary | ICD-10-CM | POA: Diagnosis not present

## 2019-12-16 ENCOUNTER — Other Ambulatory Visit: Payer: Self-pay | Admitting: *Deleted

## 2019-12-16 DIAGNOSIS — N186 End stage renal disease: Secondary | ICD-10-CM

## 2019-12-17 DIAGNOSIS — T8249XA Other complication of vascular dialysis catheter, initial encounter: Secondary | ICD-10-CM | POA: Diagnosis not present

## 2019-12-17 DIAGNOSIS — N2581 Secondary hyperparathyroidism of renal origin: Secondary | ICD-10-CM | POA: Diagnosis not present

## 2019-12-17 DIAGNOSIS — N186 End stage renal disease: Secondary | ICD-10-CM | POA: Diagnosis not present

## 2019-12-17 DIAGNOSIS — Z992 Dependence on renal dialysis: Secondary | ICD-10-CM | POA: Diagnosis not present

## 2019-12-20 ENCOUNTER — Encounter: Payer: Self-pay | Admitting: Nurse Practitioner

## 2019-12-20 DIAGNOSIS — N186 End stage renal disease: Secondary | ICD-10-CM | POA: Diagnosis not present

## 2019-12-20 DIAGNOSIS — Z992 Dependence on renal dialysis: Secondary | ICD-10-CM | POA: Diagnosis not present

## 2019-12-20 DIAGNOSIS — I129 Hypertensive chronic kidney disease with stage 1 through stage 4 chronic kidney disease, or unspecified chronic kidney disease: Secondary | ICD-10-CM | POA: Diagnosis not present

## 2019-12-20 DIAGNOSIS — N2581 Secondary hyperparathyroidism of renal origin: Secondary | ICD-10-CM | POA: Diagnosis not present

## 2019-12-21 ENCOUNTER — Ambulatory Visit (HOSPITAL_COMMUNITY): Payer: BC Managed Care – PPO | Attending: Vascular Surgery

## 2019-12-21 NOTE — Progress Notes (Deleted)
  POST OPERATIVE OFFICE NOTE    CC:  F/u for surgery  HPI:  This is a 54 y.o. male who is s/p left RC AVF on 11/11/2019 by Dr. Carlis Abbott.  He is currently dialyzing via right TDC that was placed by IR on 08/12/2019.    Pt states they do *** have pain/numbness in their *** hand.     The pt is on dialysis M/W/F at *** center.   Allergies  Allergen Reactions  . Amlodipine Other (See Comments)    Dizziness reported    Current Outpatient Medications  Medication Sig Dispense Refill  . aspirin EC 81 MG tablet Take 1 tablet (81 mg total) by mouth daily. 90 tablet 3  . atorvastatin (LIPITOR) 40 MG tablet Take 1 tablet (40 mg total) by mouth daily. 90 tablet 0  . Blood Glucose Monitoring Suppl (CONTOUR NEXT MONITOR) w/Device KIT 1 Device by Does not apply route 4 (four) times daily - after meals and at bedtime. 1 kit 0  . carvedilol (COREG) 6.25 MG tablet Take 1 tablet (6.25 mg total) by mouth 2 (two) times daily with a meal. 180 tablet 0  . enalapril (VASOTEC) 5 MG tablet Take 1 tablet (5 mg total) by mouth daily. 90 tablet 0  . furosemide (LASIX) 80 MG tablet Take 1 tablet (80 mg total) by mouth 2 (two) times daily. (Patient not taking: Reported on 11/30/2019) 60 tablet 0  . glucose blood test strip Use as instructed. Inject into the skin 3 times daily E11.65 200 each 12  . hydrALAZINE (APRESOLINE) 25 MG tablet Take 1 tablet (25 mg total) by mouth 3 (three) times daily. 270 tablet 0  . insulin lispro (HUMALOG KWIKPEN) 100 UNIT/ML KwikPen Inject 0.1-0.15 mLs (10-15 Units total) into the skin 2 (two) times daily before a meal. 15 mL 6  . Insulin Pen Needle (B-D ULTRAFINE III SHORT PEN) 31G X 8 MM MISC 1 application by Does not apply route 3 (three) times daily. E11.65 200 each 11  . Lancet Devices (MICROLET NEXT LANCING DEVICE) MISC 1 Device by Does not apply route 3 (three) times daily. 1 each 11  . Levetiracetam 750 MG TB24 Take 2 tablets (1,500 mg total) by mouth daily. 180 tablet 0  . Microlet  Lancets MISC 1 Device by Does not apply route 3 (three) times daily. E11.65 200 each 11  . Semaglutide,0.25 or 0.5MG/DOS, (OZEMPIC, 0.25 OR 0.5 MG/DOSE,) 2 MG/1.5ML SOPN Inject 0.375 mg into the skin once a week. 5.36 mg plus 5 clicks 3 pen 6   No current facility-administered medications for this visit.     ROS:  See HPI  Physical Exam:  ***  Incision:  *** Extremities:  There *** a palpable *** pulse.  Motor and sensory *** in tact.  There *** a thrill/bruit present.   Dialysis Duplex on 12/21/2019: Diameter:  *** Depth:  ***   Assessment/Plan:  This is a 54 y.o. male who is s/p: left RC AVF on 11/11/2019 by Dr. Carlis Abbott.  -the pt does *** have evidence of steal. -the fistula/graft can be used on or about February 03, 2020. -If pt has tunneled dialysis catheter and the access has been used successfully to the satisfaction of the dialysis center, the tunneled catheter can be removed at their discretion.   -the pt will follow up ***    [facility] [phone]  Clinic MD:  Carlis Abbott

## 2019-12-22 DIAGNOSIS — T8249XA Other complication of vascular dialysis catheter, initial encounter: Secondary | ICD-10-CM | POA: Diagnosis not present

## 2019-12-22 DIAGNOSIS — N2581 Secondary hyperparathyroidism of renal origin: Secondary | ICD-10-CM | POA: Diagnosis not present

## 2019-12-22 DIAGNOSIS — Z992 Dependence on renal dialysis: Secondary | ICD-10-CM | POA: Diagnosis not present

## 2019-12-22 DIAGNOSIS — N186 End stage renal disease: Secondary | ICD-10-CM | POA: Diagnosis not present

## 2019-12-24 ENCOUNTER — Other Ambulatory Visit: Payer: Self-pay | Admitting: *Deleted

## 2019-12-24 DIAGNOSIS — Z992 Dependence on renal dialysis: Secondary | ICD-10-CM

## 2019-12-24 DIAGNOSIS — N2581 Secondary hyperparathyroidism of renal origin: Secondary | ICD-10-CM | POA: Diagnosis not present

## 2019-12-24 DIAGNOSIS — N186 End stage renal disease: Secondary | ICD-10-CM

## 2019-12-24 DIAGNOSIS — T8249XA Other complication of vascular dialysis catheter, initial encounter: Secondary | ICD-10-CM | POA: Diagnosis not present

## 2019-12-27 DIAGNOSIS — T8249XA Other complication of vascular dialysis catheter, initial encounter: Secondary | ICD-10-CM | POA: Diagnosis not present

## 2019-12-27 DIAGNOSIS — N186 End stage renal disease: Secondary | ICD-10-CM | POA: Diagnosis not present

## 2019-12-27 DIAGNOSIS — Z992 Dependence on renal dialysis: Secondary | ICD-10-CM | POA: Diagnosis not present

## 2019-12-27 DIAGNOSIS — N2581 Secondary hyperparathyroidism of renal origin: Secondary | ICD-10-CM | POA: Diagnosis not present

## 2019-12-29 ENCOUNTER — Ambulatory Visit (INDEPENDENT_AMBULATORY_CARE_PROVIDER_SITE_OTHER): Payer: Self-pay | Admitting: Physician Assistant

## 2019-12-29 ENCOUNTER — Ambulatory Visit (HOSPITAL_COMMUNITY)
Admission: RE | Admit: 2019-12-29 | Discharge: 2019-12-29 | Disposition: A | Discharge status: Home / Self Care | Payer: BC Managed Care – PPO | Source: Ambulatory Visit | Attending: Surgery | Admitting: Surgery

## 2019-12-29 ENCOUNTER — Other Ambulatory Visit: Payer: Self-pay

## 2019-12-29 VITALS — BP 106/68 | HR 94 | Resp 20 | Ht 68.0 in | Wt 305.6 lb

## 2019-12-29 DIAGNOSIS — N2581 Secondary hyperparathyroidism of renal origin: Secondary | ICD-10-CM | POA: Diagnosis not present

## 2019-12-29 DIAGNOSIS — Z992 Dependence on renal dialysis: Secondary | ICD-10-CM | POA: Diagnosis not present

## 2019-12-29 DIAGNOSIS — T8249XA Other complication of vascular dialysis catheter, initial encounter: Secondary | ICD-10-CM | POA: Diagnosis not present

## 2019-12-29 DIAGNOSIS — N186 End stage renal disease: Secondary | ICD-10-CM | POA: Insufficient documentation

## 2019-12-29 NOTE — Progress Notes (Signed)
    Postoperative Access Visit   History of Present Illness   Jeremy [name] is a 54 y.o. year old male who presents for postoperative follow-up for: left radiocephalic arteriovenous fistula 11/11/19 by Dr. Carlis Abbott.  The patient's wounds are well healed.  The patient notes mild steal symptoms.  The patient is able to complete their activities of daily living.  The patient's current symptoms are: coldness and intermittent tingling of left fingers. Has improved some over past couple weeks  He currently dialyzes M[facility] via right IJ [facility]   Physical Examination   Vitals:   12/29/19 1327  BP: 106/68  Pulse: 94  Resp: 20  SpO2: 95%  Weight: (!) 305 lb 9.6 oz (138.6 kg)  Height: 5\' 8"  (1.727 m)   Body mass index is 46.47 kg/m.  left arm Incision is well healed, 2+ radial pulse, hand grip is 5/5, sensation in digits is intact, palpable thrill, bruit can be auscultated.    Non Invasive Vascular Lab Evaluation: VAS US Duplex Dialysis Access 12/29/19 +------------+----------+-------------+----------+-------------------------  ----+  OUTFLOW VEINPSV (cm/s)Diameter (cm)Depth (cm) Describe        +------------+----------+-------------+----------+-------------------------  ----+  AC Fossa    129    0.62     0.52  Retained valve and  competing                                branch         +------------+----------+-------------+----------+-------------------------  ----+  Prox Forearm  125    0.48     0.82     competing branch      +------------+----------+-------------+----------+-------------------------  ----+  Mid Forearm   155    0.50     0.59     competing branch      +------------+----------+-------------+----------+-------------------------  ----+  Dist Forearm  293    0.38     0.50     competing branch        +------------+----------+-------------+----------+-------------------------  ----+    Summary: Patent arteriovenous fistula.    Medical Decision Making   Jeremy [name] is a 54 y.o. year old male who presents s/p left radiocephalic arteriovenous fistula 11/11/19 by Dr. Carlis Abbott. Fistula is working well with good thrill and bruit. Has healed well. On non invasive studies it has not fully matured and there are competing branches present. I have encouraged him to continue to exercise the left arm to help the fistula mature more. I did discuss with him that if there are issues with cannulating the fistula he may need the branches ligated in the future   Patent has minimal signs or symptoms of steal syndrome  The patient's access will be ready for use 02/10/20  The patient's tunneled dialysis catheter can be removed when Nephrology is comfortable with the performance of the left av fistula  The patient may follow up on a prn basis   Karoline Caldwell, PA-C [facility] Office: [phone]  Clinic MD: Dr. Oneida Alar

## 2019-12-30 DIAGNOSIS — N2581 Secondary hyperparathyroidism of renal origin: Secondary | ICD-10-CM | POA: Diagnosis not present

## 2019-12-30 DIAGNOSIS — Z992 Dependence on renal dialysis: Secondary | ICD-10-CM | POA: Diagnosis not present

## 2019-12-30 DIAGNOSIS — N186 End stage renal disease: Secondary | ICD-10-CM | POA: Diagnosis not present

## 2019-12-30 DIAGNOSIS — T8249XA Other complication of vascular dialysis catheter, initial encounter: Secondary | ICD-10-CM | POA: Diagnosis not present

## 2020-01-03 ENCOUNTER — Telehealth: Payer: Self-pay | Admitting: *Deleted

## 2020-01-03 DIAGNOSIS — N2581 Secondary hyperparathyroidism of renal origin: Secondary | ICD-10-CM | POA: Diagnosis not present

## 2020-01-03 DIAGNOSIS — Z992 Dependence on renal dialysis: Secondary | ICD-10-CM | POA: Diagnosis not present

## 2020-01-03 DIAGNOSIS — N186 End stage renal disease: Secondary | ICD-10-CM | POA: Diagnosis not present

## 2020-01-03 DIAGNOSIS — T8249XA Other complication of vascular dialysis catheter, initial encounter: Secondary | ICD-10-CM | POA: Diagnosis not present

## 2020-01-03 NOTE — Telephone Encounter (Signed)
Dr. Henrene Pastor,  This pt is coming in for a screening colonoscopy on 01-25-20.  He saw you in the office 03-12-18 and had to cancel his procedure that was scheduled after that.  Since his OV, his renal failure has worsened and he is in ESRD and on dialysis, among other multiple medical issues.  Is he ok for a direct colonoscopy or would you like an OV?  Thanks, [employer]

## 2020-01-03 NOTE — Telephone Encounter (Signed)
Okay for direct: 1.  Morning appointment 2.  Nondialysis day 3.  Adjust insulin Thanks, Dr. Henrene Pastor

## 2020-01-03 NOTE — Telephone Encounter (Signed)
noted 

## 2020-01-05 DIAGNOSIS — Z992 Dependence on renal dialysis: Secondary | ICD-10-CM | POA: Diagnosis not present

## 2020-01-05 DIAGNOSIS — N186 End stage renal disease: Secondary | ICD-10-CM | POA: Diagnosis not present

## 2020-01-05 DIAGNOSIS — N2581 Secondary hyperparathyroidism of renal origin: Secondary | ICD-10-CM | POA: Diagnosis not present

## 2020-01-05 DIAGNOSIS — T8249XA Other complication of vascular dialysis catheter, initial encounter: Secondary | ICD-10-CM | POA: Diagnosis not present

## 2020-01-07 DIAGNOSIS — N186 End stage renal disease: Secondary | ICD-10-CM | POA: Diagnosis not present

## 2020-01-07 DIAGNOSIS — T8249XA Other complication of vascular dialysis catheter, initial encounter: Secondary | ICD-10-CM | POA: Diagnosis not present

## 2020-01-07 DIAGNOSIS — N2581 Secondary hyperparathyroidism of renal origin: Secondary | ICD-10-CM | POA: Diagnosis not present

## 2020-01-07 DIAGNOSIS — E113313 Type 2 diabetes mellitus with moderate nonproliferative diabetic retinopathy with macular edema, bilateral: Secondary | ICD-10-CM | POA: Diagnosis not present

## 2020-01-07 DIAGNOSIS — Z992 Dependence on renal dialysis: Secondary | ICD-10-CM | POA: Diagnosis not present

## 2020-01-10 DIAGNOSIS — N186 End stage renal disease: Secondary | ICD-10-CM | POA: Diagnosis not present

## 2020-01-10 DIAGNOSIS — T8249XA Other complication of vascular dialysis catheter, initial encounter: Secondary | ICD-10-CM | POA: Diagnosis not present

## 2020-01-10 DIAGNOSIS — N2581 Secondary hyperparathyroidism of renal origin: Secondary | ICD-10-CM | POA: Diagnosis not present

## 2020-01-10 DIAGNOSIS — Z23 Encounter for immunization: Secondary | ICD-10-CM | POA: Diagnosis not present

## 2020-01-10 DIAGNOSIS — Z992 Dependence on renal dialysis: Secondary | ICD-10-CM | POA: Diagnosis not present

## 2020-01-13 DIAGNOSIS — N2581 Secondary hyperparathyroidism of renal origin: Secondary | ICD-10-CM | POA: Diagnosis not present

## 2020-01-13 DIAGNOSIS — Z992 Dependence on renal dialysis: Secondary | ICD-10-CM | POA: Diagnosis not present

## 2020-01-13 DIAGNOSIS — N186 End stage renal disease: Secondary | ICD-10-CM | POA: Diagnosis not present

## 2020-01-13 DIAGNOSIS — T8249XA Other complication of vascular dialysis catheter, initial encounter: Secondary | ICD-10-CM | POA: Diagnosis not present

## 2020-01-13 DIAGNOSIS — Z23 Encounter for immunization: Secondary | ICD-10-CM | POA: Diagnosis not present

## 2020-01-14 DIAGNOSIS — Z23 Encounter for immunization: Secondary | ICD-10-CM | POA: Diagnosis not present

## 2020-01-14 DIAGNOSIS — T8249XA Other complication of vascular dialysis catheter, initial encounter: Secondary | ICD-10-CM | POA: Diagnosis not present

## 2020-01-14 DIAGNOSIS — N186 End stage renal disease: Secondary | ICD-10-CM | POA: Diagnosis not present

## 2020-01-14 DIAGNOSIS — Z992 Dependence on renal dialysis: Secondary | ICD-10-CM | POA: Diagnosis not present

## 2020-01-14 DIAGNOSIS — N2581 Secondary hyperparathyroidism of renal origin: Secondary | ICD-10-CM | POA: Diagnosis not present

## 2020-01-17 DIAGNOSIS — T8249XA Other complication of vascular dialysis catheter, initial encounter: Secondary | ICD-10-CM | POA: Diagnosis not present

## 2020-01-17 DIAGNOSIS — Z992 Dependence on renal dialysis: Secondary | ICD-10-CM | POA: Diagnosis not present

## 2020-01-17 DIAGNOSIS — N2581 Secondary hyperparathyroidism of renal origin: Secondary | ICD-10-CM | POA: Diagnosis not present

## 2020-01-17 DIAGNOSIS — N186 End stage renal disease: Secondary | ICD-10-CM | POA: Diagnosis not present

## 2020-01-19 DIAGNOSIS — N186 End stage renal disease: Secondary | ICD-10-CM | POA: Diagnosis not present

## 2020-01-19 DIAGNOSIS — T8249XA Other complication of vascular dialysis catheter, initial encounter: Secondary | ICD-10-CM | POA: Diagnosis not present

## 2020-01-19 DIAGNOSIS — N2581 Secondary hyperparathyroidism of renal origin: Secondary | ICD-10-CM | POA: Diagnosis not present

## 2020-01-19 DIAGNOSIS — I129 Hypertensive chronic kidney disease with stage 1 through stage 4 chronic kidney disease, or unspecified chronic kidney disease: Secondary | ICD-10-CM | POA: Diagnosis not present

## 2020-01-19 DIAGNOSIS — Z992 Dependence on renal dialysis: Secondary | ICD-10-CM | POA: Diagnosis not present

## 2020-01-21 DIAGNOSIS — N2581 Secondary hyperparathyroidism of renal origin: Secondary | ICD-10-CM | POA: Diagnosis not present

## 2020-01-21 DIAGNOSIS — N186 End stage renal disease: Secondary | ICD-10-CM | POA: Diagnosis not present

## 2020-01-21 DIAGNOSIS — Z992 Dependence on renal dialysis: Secondary | ICD-10-CM | POA: Diagnosis not present

## 2020-01-21 DIAGNOSIS — T8249XA Other complication of vascular dialysis catheter, initial encounter: Secondary | ICD-10-CM | POA: Diagnosis not present

## 2020-01-24 DIAGNOSIS — N2581 Secondary hyperparathyroidism of renal origin: Secondary | ICD-10-CM | POA: Diagnosis not present

## 2020-01-24 DIAGNOSIS — T8249XA Other complication of vascular dialysis catheter, initial encounter: Secondary | ICD-10-CM | POA: Diagnosis not present

## 2020-01-24 DIAGNOSIS — N186 End stage renal disease: Secondary | ICD-10-CM | POA: Diagnosis not present

## 2020-01-24 DIAGNOSIS — Z992 Dependence on renal dialysis: Secondary | ICD-10-CM | POA: Diagnosis not present

## 2020-01-25 ENCOUNTER — Encounter: Payer: BC Managed Care – PPO | Admitting: Internal Medicine

## 2020-01-26 DIAGNOSIS — T8249XA Other complication of vascular dialysis catheter, initial encounter: Secondary | ICD-10-CM | POA: Diagnosis not present

## 2020-01-26 DIAGNOSIS — Z992 Dependence on renal dialysis: Secondary | ICD-10-CM | POA: Diagnosis not present

## 2020-01-26 DIAGNOSIS — N186 End stage renal disease: Secondary | ICD-10-CM | POA: Diagnosis not present

## 2020-01-26 DIAGNOSIS — N2581 Secondary hyperparathyroidism of renal origin: Secondary | ICD-10-CM | POA: Diagnosis not present

## 2020-01-28 DIAGNOSIS — T8249XA Other complication of vascular dialysis catheter, initial encounter: Secondary | ICD-10-CM | POA: Diagnosis not present

## 2020-01-28 DIAGNOSIS — N2581 Secondary hyperparathyroidism of renal origin: Secondary | ICD-10-CM | POA: Diagnosis not present

## 2020-01-28 DIAGNOSIS — N186 End stage renal disease: Secondary | ICD-10-CM | POA: Diagnosis not present

## 2020-01-28 DIAGNOSIS — Z992 Dependence on renal dialysis: Secondary | ICD-10-CM | POA: Diagnosis not present

## 2020-01-31 ENCOUNTER — Telehealth: Payer: Self-pay

## 2020-01-31 NOTE — Telephone Encounter (Signed)
Pt called triage to schedule an appt. I returned his call and his VM is not set up.

## 2020-01-31 NOTE — Telephone Encounter (Signed)
Pt called triage to make an appt s/p AVF. He states he has intermittent hand pain, fingers swelling, and numbness. This has been going on for about 1 month; he thought it would resolve on its own. Dialysis has not accessed it yet but was going to in a week or so. Pt has been given an appt tomorrow and is aware.

## 2020-02-01 ENCOUNTER — Other Ambulatory Visit: Payer: Self-pay

## 2020-02-01 ENCOUNTER — Ambulatory Visit (INDEPENDENT_AMBULATORY_CARE_PROVIDER_SITE_OTHER): Payer: Self-pay | Admitting: Physician Assistant

## 2020-02-01 VITALS — BP 143/84 | HR 75 | Temp 96.8°F | Resp 20 | Ht 68.0 in | Wt 317.6 lb

## 2020-02-01 DIAGNOSIS — N186 End stage renal disease: Secondary | ICD-10-CM

## 2020-02-01 DIAGNOSIS — Z992 Dependence on renal dialysis: Secondary | ICD-10-CM

## 2020-02-01 NOTE — Progress Notes (Signed)
    Postoperative Access Visit   History of Present Illness   Eva Vallee is a 54 y.o. year old male who presents for postoperative follow-up for  left radiocephalic arteriovenous fistula 11/11/19 by Dr. Carlis Abbott.  The patient's wounds are well healed. The patient had noted mild steal symptoms at the time of his last office visit on 12/29/19. He was having coldness and intermittent tingling of left fingers. He returns today with worsening discomfort in his left hand and forearm. States he has been having sharp stabbing pains that shoot through forearm and into his left hand. He also has had increased swelling in palm and fingers and forearm. He also notes increased tingling and numbness. He says he had been trying to use squeeze ball as instructed at last visit to exercise and hopefully mature fistula more however he has not been able to tolerate this because of increase pain. He also states that the pain does at times wake him up and he either has to dangle arm off of bed or reposition until he can fall back to sleep. He is still able to complete his ADLs but this is becoming more bothersome.  He currently dialyzes M[facility] via right IJ [facility]  Physical Examination   Vitals:   02/01/20 0803  BP: (!) 143/84  Pulse: 75  Resp: 20  Temp: (!) 96.8 F (36 C)  TempSrc: Temporal  SpO2: 97%  Weight: (!) 317 lb 9.6 oz (144.1 kg)  Height: 5\' 8"  (1.727 m)   Body mass index is 48.29 kg/m.  left arm Incision is well healed, 2+ radial pulse, hand grip is 4/5, sensation in digits is  intact, palpable thrill, bruit can be auscultated. Left hand warm, fingers cool. No ischemic changes. Left forearm and hand mildly swollen and tight compared to right    Medical Decision Making    Zac Torti is a 54 y.o. year old male who presents s/p left radiocephalic arteriovenous fistula 11/11/19 by Dr. Carlis Abbott. Fistula is working well with good thrill and bruit. Has healed well. On non invasive studies  at last visit showed that the fistula was not fully matured and there are competing branches present. I  encouraged him to continue to exercise the left arm to help the fistula mature more, however due to pain he has been limited in tolerating this. I did discuss with him that if there are issues with cannulating the fistula he may need the branches ligated in the future and or elevation. We had discussion about the nature of fistula's and how they do steal blood flow to the hand. I discussed giving it some time to see if he is able to tolerate his symptoms vs ligation of the fistula. He would be a candidate for a left BV fistula based on prior vein mapping.  He is willing to wait and see how his arm feels for another couple weeks. I have recommended another ultrasound to see if the fistula is more matured. With his swelling it now feels deeper in his forearm so there is some concern about needing an elevation. Encouraged him to elevate his left arm and exercise as tolerated. He will return in 3-4 weeks for re- evaluation of his steal symptoms and fistula duplex   Karoline Caldwell, PA-C [facility] [place] Office: [facility] MD: Carlis Abbott

## 2020-02-02 ENCOUNTER — Telehealth: Payer: Self-pay

## 2020-02-02 NOTE — Telephone Encounter (Signed)
Pt was seen in office yesterday and called triage today requesting pain medication. Per PA, I have advised him to try taking extra strength Tylenol for the next couple of days. If that does not provide relief I have told him to call us back and let us know. Pt verbalized understanding.

## 2020-02-03 DIAGNOSIS — T8249XA Other complication of vascular dialysis catheter, initial encounter: Secondary | ICD-10-CM | POA: Diagnosis not present

## 2020-02-03 DIAGNOSIS — N2581 Secondary hyperparathyroidism of renal origin: Secondary | ICD-10-CM | POA: Diagnosis not present

## 2020-02-03 DIAGNOSIS — N186 End stage renal disease: Secondary | ICD-10-CM | POA: Diagnosis not present

## 2020-02-03 DIAGNOSIS — Z992 Dependence on renal dialysis: Secondary | ICD-10-CM | POA: Diagnosis not present

## 2020-02-04 DIAGNOSIS — N186 End stage renal disease: Secondary | ICD-10-CM | POA: Diagnosis not present

## 2020-02-04 DIAGNOSIS — N2581 Secondary hyperparathyroidism of renal origin: Secondary | ICD-10-CM | POA: Diagnosis not present

## 2020-02-04 DIAGNOSIS — Z992 Dependence on renal dialysis: Secondary | ICD-10-CM | POA: Diagnosis not present

## 2020-02-04 DIAGNOSIS — T8249XA Other complication of vascular dialysis catheter, initial encounter: Secondary | ICD-10-CM | POA: Diagnosis not present

## 2020-02-07 ENCOUNTER — Ambulatory Visit (INDEPENDENT_AMBULATORY_CARE_PROVIDER_SITE_OTHER): Payer: BC Managed Care – PPO | Admitting: Adult Health

## 2020-02-07 ENCOUNTER — Encounter: Payer: Self-pay | Admitting: Adult Health

## 2020-02-07 ENCOUNTER — Other Ambulatory Visit: Payer: Self-pay

## 2020-02-07 VITALS — BP 140/80 | HR 86 | Ht 68.0 in | Wt 323.0 lb

## 2020-02-07 DIAGNOSIS — R569 Unspecified convulsions: Secondary | ICD-10-CM

## 2020-02-07 DIAGNOSIS — N2581 Secondary hyperparathyroidism of renal origin: Secondary | ICD-10-CM | POA: Diagnosis not present

## 2020-02-07 DIAGNOSIS — Z992 Dependence on renal dialysis: Secondary | ICD-10-CM | POA: Diagnosis not present

## 2020-02-07 DIAGNOSIS — N186 End stage renal disease: Secondary | ICD-10-CM | POA: Diagnosis not present

## 2020-02-07 DIAGNOSIS — T8249XA Other complication of vascular dialysis catheter, initial encounter: Secondary | ICD-10-CM | POA: Diagnosis not present

## 2020-02-07 MED ORDER — LEVETIRACETAM ER 750 MG PO TB24: 1500.0000 mg | ORAL_TABLET | Freq: Every day | ORAL | 3 refills | Status: DC

## 2020-02-07 MED ORDER — LEVETIRACETAM ER 500 MG PO TB24: 1000.0000 mg | ORAL_TABLET | Freq: Every day | ORAL | 3 refills | Status: DC

## 2020-02-07 NOTE — Progress Notes (Signed)
PATIENT: Jeremy [name] DOB: [birth year]  REASON FOR VISIT: follow up HISTORY FROM: patient  HISTORY OF PRESENT ILLNESS: Today 02/07/20:  Jeremy [name] is a 54 year old male with a history of seizures.  He returns today for follow-up.  He is on Keppra extended release.  His prescription was written for 750 mg 2 tablets daily.  However he states he has been breaking the tablets in half and taking a half a tablet in the morning and a half a tablet in the evening.  He states that the full dose makes him dizzy.  He denies any seizure events.  He operates a Teacher, music without difficulty.  He returns today for an evaluation.  HISTORY 02/04/19:  Jeremy [name] is a 54 year old male with a history of seizures.  He returns today for follow-up.  He is currently on Keppra however he is unsure if he is on the extended release or the immediate release.  We will call his pharmacy to verify.  He reports he has not had any seizures.  He operates a Teacher, music without difficulty.  Denies any changes in his gait or balance.  No change in his mood or behavior.  He returns today for follow-up.  REVIEW OF SYSTEMS: Out of a complete 14 system review of symptoms, the patient complains only of the following symptoms, and all other reviewed systems are negative.  See HPI  ALLERGIES: Allergies  Allergen Reactions  . Amlodipine Other (See Comments)    Dizziness reported    HOME MEDICATIONS: Outpatient Medications Prior to Visit  Medication Sig Dispense Refill  . aspirin EC 81 MG tablet Take 1 tablet (81 mg total) by mouth daily. 90 tablet 3  . atorvastatin (LIPITOR) 40 MG tablet Take 1 tablet (40 mg total) by mouth daily. 90 tablet 0  . Blood Glucose Monitoring Suppl (CONTOUR NEXT MONITOR) w/Device KIT 1 Device by Does not apply route 4 (four) times daily - after meals and at bedtime. 1 kit 0  . carvedilol (COREG) 6.25 MG tablet Take 1 tablet (6.25 mg total) by mouth 2 (two) times daily with a meal. 180  tablet 0  . enalapril (VASOTEC) 5 MG tablet Take 1 tablet (5 mg total) by mouth daily. 90 tablet 0  . glucose blood test strip Use as instructed. Inject into the skin 3 times daily E11.65 200 each 12  . hydrALAZINE (APRESOLINE) 25 MG tablet Take 1 tablet (25 mg total) by mouth 3 (three) times daily. 270 tablet 0  . insulin lispro (HUMALOG KWIKPEN) 100 UNIT/ML KwikPen Inject 0.1-0.15 mLs (10-15 Units total) into the skin 2 (two) times daily before a meal. 15 mL 6  . Insulin Pen Needle (B-D ULTRAFINE III SHORT PEN) 31G X 8 MM MISC 1 application by Does not apply route 3 (three) times daily. E11.65 200 each 11  . Lancet Devices (MICROLET NEXT LANCING DEVICE) MISC 1 Device by Does not apply route 3 (three) times daily. 1 each 11  . Levetiracetam 750 MG TB24 Take 2 tablets (1,500 mg total) by mouth daily. 180 tablet 0  . Microlet Lancets MISC 1 Device by Does not apply route 3 (three) times daily. E11.65 200 each 11  . Semaglutide,0.25 or 0.5MG/DOS, (OZEMPIC, 0.25 OR 0.5 MG/DOSE,) 2 MG/1.5ML SOPN Inject 0.375 mg into the skin once a week. 1.61 mg plus 5 clicks 3 pen 6  . furosemide (LASIX) 80 MG tablet Take 1 tablet (80 mg total) by mouth 2 (two) times daily. 60 tablet 0  No facility-administered medications prior to visit.    PAST MEDICAL HISTORY: Past Medical History:  Diagnosis Date  . Abscess   . AKI (acute kidney injury) ([place]) 08/05/2017  . Bell's palsy   . CKD (chronic kidney disease)    Dr. McDiarmind   . Headache   . High cholesterol   . Hypertension   . Left shoulder pain 10/26/2013   S/p injection on 10/26/13   . Renal insufficiency 02/14/2014  . Seizures ([place]) 08/04/2017   "related to high blood sugars" (08/05/2017)  . Type II diabetes mellitus ([place])     PAST SURGICAL HISTORY: Past Surgical History:  Procedure Laterality Date  . AV FISTULA PLACEMENT Left 11/11/2019   Procedure: LEFT FOREARM RADIOCEPHALIC ARTERIOVENOUS (AV) FISTULA CREATION;  Surgeon: [name], MD;   Location: [facility];  Service: Vascular;  Laterality: Left;  . CHOLECYSTECTOMY    . Eyelid surgery Right   . I & D EXTREMITY  11/11/2011   Procedure: IRRIGATION AND DEBRIDEMENT EXTREMITY;  Surgeon: Merrie Roof, MD;  Location: [facility];  Service: General;  Laterality: Right;  I&D Right Thigh Abscess  . IR FLUORO GUIDE CV LINE RIGHT  08/12/2019  . IR US GUIDE VASC ACCESS RIGHT  08/12/2019    FAMILY HISTORY: Family History  Problem Relation Age of Onset  . Diabetes Mother   . Heart disease Mother 47  . Diabetes Sister   . Diabetes Brother   . Diabetes Brother   . Cancer Maternal Uncle        type unknown  . Stroke Neg Hx     SOCIAL HISTORY: Social History   Socioeconomic History  . Marital status: Divorced    Spouse name: Not on file  . Number of children: 3  . Years of education: Not on file  . Highest education level: Not on file  Occupational History  . Occupation: maintenance tech  Tobacco Use  . Smoking status: Never Smoker  . Smokeless tobacco: Never Used  Vaping Use  . Vaping Use: Never used  Substance and Sexual Activity  . Alcohol use: Not Currently  . Drug use: No  . Sexual activity: Yes  Other Topics Concern  . 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  Did not graduate high school.  Has 3 adult children in [place].  Divorced.  Lives with girlfriend [name].                   Social Determinants of Health   Financial Resource Strain:   . Difficulty of Paying Living Expenses:   Food Insecurity:   . Worried About Charity fundraiser in the Last Year:   . Arboriculturist in the Last Year:   Transportation Needs:   . Film/video editor (Medical):   Marland Kitchen Lack of Transportation (Non-Medical):   Physical Activity: Unknown  . Days of Exercise per Week: 2 days  . Minutes of Exercise per Session: Not on file  Stress:   . Feeling of Stress :   Social Connections:   . Frequency of  Communication with Friends and Family:   . Frequency of Social Gatherings with Friends and Family:   . Attends Religious Services:   . Active Member of Clubs or Organizations:   . Attends Archivist Meetings:   Marland Kitchen Marital Status:   Intimate Partner Violence:   . Fear of Current or Ex-Partner:   .  Emotionally Abused:   Marland Kitchen Physically Abused:   . Sexually Abused:       PHYSICAL EXAM  Vitals:   02/07/20 0806  BP: 140/80  Pulse: 86  Weight: (!) 323 lb (146.5 kg)  Height: _0  (1.727 m)   Body mass index is 49.11 kg/m.  Generalized: Well developed, in no acute distress   Neurological examination  Mentation: Alert oriented to time, place, history taking. Follows all commands speech and language fluent Cranial nerve II-XII: Pupils were equal round reactive to light. Extraocular movements were full, visual field were full on confrontational test.Head turning and shoulder shrug  were normal and symmetric. Motor: The motor testing reveals 5 over 5 strength of all 4 extremities. Good symmetric motor tone is noted throughout.  Sensory: Sensory testing is intact to soft touch on all 4 extremities. No evidence of extinction is noted.  Coordination: Cerebellar testing reveals good finger-nose-finger and heel-to-shin bilaterally.  Gait and station: Gait is normal.  Reflexes: Deep tendon reflexes are symmetric and normal bilaterally.   DIAGNOSTIC DATA (LABS, IMAGING, TESTING) - I reviewed patient records, labs, notes, testing and imaging myself where available.  Lab Results  Component Value Date   WBC 7.0 11/09/2019   HGB 12.2 (L) 11/11/2019   HCT 36.0 (L) 11/11/2019   MCV 81 11/09/2019   PLT 208 11/09/2019      Component Value Date/Time   NA 135 11/11/2019 1151   NA 140 11/09/2019 1405   K 5.0 11/11/2019 1151   CL 102 11/11/2019 1151   CO2 25 11/09/2019 1405   GLUCOSE 142 (H) 11/11/2019 1151   BUN 46 (H) 11/11/2019 1151   BUN 56 (H) 11/09/2019 1405   CREATININE 9.00  (H) 11/11/2019 1151   CREATININE 2.77 (H) 05/31/2016 0844   CALCIUM 9.0 11/09/2019 1405   PROT 7.7 11/09/2019 1405   ALBUMIN 4.2 11/09/2019 1405   AST 17 11/09/2019 1405   ALT 15 11/09/2019 1405   ALKPHOS 99 11/09/2019 1405   BILITOT 0.2 11/09/2019 1405   GFRNONAA 7 (L) 11/09/2019 1405   GFRNONAA 26 (L) 05/31/2016 0844   GFRAA 8 (L) 11/09/2019 1405   GFRAA 30 (L) 05/31/2016 0844   Lab Results  Component Value Date   CHOL 144 11/09/2019   HDL 46 11/09/2019   LDLCALC 80 11/09/2019   TRIG 95 11/09/2019   CHOLHDL 3.1 11/09/2019   Lab Results  Component Value Date   HGBA1C 8.1 (H) 11/09/2019   Lab Results  Component Value Date   VITAMINB12 412 11/26/2013   Lab Results  Component Value Date   TSH 1.740 09/23/2018      ASSESSMENT AND PLAN 54 y.o. year old male  has a past medical history of Abscess, AKI (acute kidney injury) ([place]) (08/05/2017), Bell's palsy, CKD (chronic kidney disease), Headache, High cholesterol, Hypertension, Left shoulder pain (10/26/2013), Renal insufficiency (02/14/2014), Seizures ([place]) (08/04/2017), and Type II diabetes mellitus ([place]). here with :  Seizures    Keppra dose changed to 500 mg extended release-2 tablets daily for total of 1000 mg daily  Advised patient if he has any seizure events he should let us know  Follow-up in 1 year or sooner if needed   I spent 20 minutes of face-to-face and non-face-to-face time with patient.  This included previsit chart review, lab review, study review, order entry, electronic health record documentation, patient education.  Ward Givens, MSN, NP-C 02/07/2020, 8:17 AM [facility] [address] [place], [place] [address] [phone]

## 2020-02-07 NOTE — Progress Notes (Signed)
I have read the note, and I agree with the clinical assessment and plan.  Aliciana Ricciardi A. Katie Faraone, MD, PhD, FAAN Certified in Neurology, Clinical Neurophysiology, Sleep Medicine, Pain Medicine and Neuroimaging  [facility] [address], [address] [place], [place] [address] [phone]  

## 2020-02-07 NOTE — Patient Instructions (Addendum)
Keppra changed to 500 mg- take 2 tablets daily If you have any seizure events please let us know.

## 2020-02-08 ENCOUNTER — Ambulatory Visit: Payer: BC Managed Care – PPO | Admitting: Nurse Practitioner

## 2020-02-09 DIAGNOSIS — Z992 Dependence on renal dialysis: Secondary | ICD-10-CM | POA: Diagnosis not present

## 2020-02-09 DIAGNOSIS — N2581 Secondary hyperparathyroidism of renal origin: Secondary | ICD-10-CM | POA: Diagnosis not present

## 2020-02-09 DIAGNOSIS — N186 End stage renal disease: Secondary | ICD-10-CM | POA: Diagnosis not present

## 2020-02-09 DIAGNOSIS — T8249XA Other complication of vascular dialysis catheter, initial encounter: Secondary | ICD-10-CM | POA: Diagnosis not present

## 2020-02-11 DIAGNOSIS — T8249XA Other complication of vascular dialysis catheter, initial encounter: Secondary | ICD-10-CM | POA: Diagnosis not present

## 2020-02-11 DIAGNOSIS — N2581 Secondary hyperparathyroidism of renal origin: Secondary | ICD-10-CM | POA: Diagnosis not present

## 2020-02-11 DIAGNOSIS — N186 End stage renal disease: Secondary | ICD-10-CM | POA: Diagnosis not present

## 2020-02-11 DIAGNOSIS — Z992 Dependence on renal dialysis: Secondary | ICD-10-CM | POA: Diagnosis not present

## 2020-02-14 DIAGNOSIS — Z992 Dependence on renal dialysis: Secondary | ICD-10-CM | POA: Diagnosis not present

## 2020-02-14 DIAGNOSIS — N186 End stage renal disease: Secondary | ICD-10-CM | POA: Diagnosis not present

## 2020-02-14 DIAGNOSIS — T8249XA Other complication of vascular dialysis catheter, initial encounter: Secondary | ICD-10-CM | POA: Diagnosis not present

## 2020-02-14 DIAGNOSIS — N2581 Secondary hyperparathyroidism of renal origin: Secondary | ICD-10-CM | POA: Diagnosis not present

## 2020-02-16 DIAGNOSIS — Z992 Dependence on renal dialysis: Secondary | ICD-10-CM | POA: Diagnosis not present

## 2020-02-16 DIAGNOSIS — T8249XA Other complication of vascular dialysis catheter, initial encounter: Secondary | ICD-10-CM | POA: Diagnosis not present

## 2020-02-16 DIAGNOSIS — N2581 Secondary hyperparathyroidism of renal origin: Secondary | ICD-10-CM | POA: Diagnosis not present

## 2020-02-16 DIAGNOSIS — N186 End stage renal disease: Secondary | ICD-10-CM | POA: Diagnosis not present

## 2020-02-17 ENCOUNTER — Other Ambulatory Visit: Payer: Self-pay | Admitting: Nurse Practitioner

## 2020-02-17 ENCOUNTER — Ambulatory Visit: Payer: Self-pay | Admitting: *Deleted

## 2020-02-17 ENCOUNTER — Ambulatory Visit: Payer: BC Managed Care – PPO | Attending: Internal Medicine | Admitting: Internal Medicine

## 2020-02-17 DIAGNOSIS — R42 Dizziness and giddiness: Secondary | ICD-10-CM | POA: Diagnosis not present

## 2020-02-17 DIAGNOSIS — I10 Essential (primary) hypertension: Secondary | ICD-10-CM

## 2020-02-17 MED ORDER — CARVEDILOL 6.25 MG PO TABS: 6.2500 mg | ORAL_TABLET | Freq: Two times a day (BID) | ORAL | 1 refills | Status: DC

## 2020-02-17 NOTE — Telephone Encounter (Signed)
Patient reports he is having chronic dizzy spells- patient states they come and go. At least once a week- some times more- can happen at any time.Patient sttaes he had one today. Patient states he is concerned about these dizzy spells and has not had work up- he states he believes it is related to medication. Call to office- they have had a cancellation on schedule and will see him.  Reason for Disposition . Taking a medicine that could cause dizziness (e.g., blood pressure medications, diuretics)  Answer Assessment - Initial Assessment Questions 1. DESCRIPTION: "Describe your dizziness."     Unsteady on feet 2. LIGHTHEADED: "Do you feel lightheaded?" (e.g., somewhat faint, woozy, weak upon standing)     woozy 3. VERTIGO: "Do you feel like either you or the room is spinning or tilting?" (i.e. vertigo)     no 4. SEVERITY: "How bad is it?"  "Do you feel like you are going to faint?" "Can you stand and walk?"   - MILD: Feels slightly dizzy, but walking normally.   - MODERATE: Feels very unsteady when walking, but not falling; interferes with normal activities (e.g., school, work) .   - SEVERE: Unable to walk without falling, or requires assistance to walk without falling; feels like passing out now.      moderate 5. ONSET:  "When did the dizziness begin?"     Patient states it has been ongoing- comes and goes 6. AGGRAVATING FACTORS: "Does anything make it worse?" (e.g., standing, change in head position)     Patient feels his BP medication is causing dizziness, does not take it before dialysis to prevent symptoms 7. HEART RATE: "Can you tell me your heart rate?" "How many beats in 15 seconds?"  (Note: not all patients can do this)       Have not noticed 8. CAUSE: "What do you think is causing the dizziness?"     Not sure- related to medication 9. RECURRENT SYMPTOM: "Have you had dizziness before?" If Yes, ask: "When was the last time?" "What happened that time?"     Yes- no diagosis 10.  OTHER SYMPTOMS: "Do you have any other symptoms?" (e.g., fever, chest pain, vomiting, diarrhea, bleeding)       no 11. PREGNANCY: "Is there any chance you are pregnant?" "When was your last menstrual period?"       n/a  Protocols used: DIZZINESS [facility]

## 2020-02-17 NOTE — Telephone Encounter (Signed)
Call to patient to verify medications- he states he is not sure of BP medication that he needs RF'd- not in front of him now. Discussed that one of the requested medications was discontinued. Patient states he had dizzy spell today and has them frequently- he has been attributing them to his BP medication- but they have been gong on for some time. He states he had on today. Patient triaged for dizziness and appointment made-see note.

## 2020-02-17 NOTE — Telephone Encounter (Signed)
Please have him check his blood pressure readings and report readings over the weekend by Monday. If he does not have a BP cuff. He will need to purchase one. Thanks!

## 2020-02-17 NOTE — Progress Notes (Signed)
Virtual Visit via Telephone Note Due to current restrictions/limitations of in-office visits due to the COVID-19 pandemic, this scheduled clinical appointment was converted to a telehealth visit  I connected with Jeremy [name] on 02/17/20 at  2:50 PM EDT by telephone and verified that I am speaking with the correct person using two identifiers. I am in my office.  The patient is at home.  Only the patient and myself participated in this encounter.  I discussed the limitations, risks, security and privacy concerns of performing an evaluation and management service by telephone and the availability of in person appointments. I also discussed with the patient that there may be a patient responsible charge related to this service. The patient expressed understanding and agreed to proceed.   History of Present Illness: Patient with history of ESRD on HD, seizure disorder, HL, HTN.  PCP is NP Raul Del.  Purpose of today's visit is urgent care for dizziness.  Pt c/o chronic dizziness x 6 mths. "Occurs mainly when I take my medicines on the regular."  Takes BP meds regularly on non-HD days.   On HD days he takes meds after HD.  Has home BP monitoring device but not checking regularly because it is checked at HD. BP at HD yesterday he thinks was 165/70.   -can get dizziness when sitting and when he stands up too quickly.  He has a mild anemia.  Not sure if he gets iron in HD.  Recently saw the neurologist because he thought the [place] was causing dizziness.  The dose was lowered from 1500 mg daily to 1000 mg daily.  However patient states he still gets the dizziness.   Outpatient Encounter Medications as of 02/17/2020  Medication Sig Note  . aspirin EC 81 MG tablet Take 1 tablet (81 mg total) by mouth daily.   Marland Kitchen atorvastatin (LIPITOR) 40 MG tablet Take 1 tablet (40 mg total) by mouth daily.   . Blood Glucose Monitoring Suppl (CONTOUR NEXT MONITOR) w/Device KIT 1 Device by Does not apply route 4 (four) times  daily - after meals and at bedtime. 08/26/2019: Using [facility] meter  . carvedilol (COREG) 6.25 MG tablet Take 1 tablet (6.25 mg total) by mouth 2 (two) times daily with a meal.   . enalapril (VASOTEC) 5 MG tablet Take 1 tablet (5 mg total) by mouth daily.   Marland Kitchen glucose blood test strip Use as instructed. Inject into the skin 3 times daily E11.65   . hydrALAZINE (APRESOLINE) 25 MG tablet Take 1 tablet (25 mg total) by mouth 3 (three) times daily.   . insulin lispro (HUMALOG KWIKPEN) 100 UNIT/ML KwikPen Inject 0.1-0.15 mLs (10-15 Units total) into the skin 2 (two) times daily before a meal.   . Insulin Pen Needle (B-D ULTRAFINE III SHORT PEN) 31G X 8 MM MISC 1 application by Does not apply route 3 (three) times daily. E11.65   . Lancet Devices (MICROLET NEXT LANCING DEVICE) MISC 1 Device by Does not apply route 3 (three) times daily.   Marland Kitchen levETIRAcetam (KEPPRA XR) 500 MG 24 hr tablet Take 2 tablets (1,000 mg total) by mouth daily.   . Microlet Lancets MISC 1 Device by Does not apply route 3 (three) times daily. E11.65   . Semaglutide,0.25 or 0.5MG/DOS, (OZEMPIC, 0.25 OR 0.5 MG/DOSE,) 2 MG/1.5ML SOPN Inject 0.375 mg into the skin once a week. 5.40 mg plus 5 clicks    No facility-administered encounter medications on file as of 02/17/2020.    Observations/Objective: Lab Results  Component Value Date   WBC 7.0 11/09/2019   HGB 12.2 (L) 11/11/2019   HCT 36.0 (L) 11/11/2019   MCV 81 11/09/2019   PLT 208 11/09/2019     Assessment and Plan: 1. Dizziness -Advised patient that he needs to go slow with position changes given that he is on several blood pressure medications.  Advised to check blood pressure at home on his nondialysis days and keep a log.  He should bring his log with him when he comes to see his PCP on the 13th of next month.  Also recommend that he check with the nephrologist who comes to the dialysis center as to whether he needs to be on iron.  The last CBC that I see in the system was from  April.  I am sure that he gets H&H checked regularly at dialysis.   Follow Up Instructions: As scheduled with Raul Del 03/03/20   I discussed the assessment and treatment plan with the patient. The patient was provided an opportunity to ask questions and all were answered. The patient agreed with the plan and demonstrated an understanding of the instructions.   The patient was advised to call back or seek an in-person evaluation if the symptoms worsen or if the condition fails to improve as anticipated.  I provided 11 minutes of non-face-to-face time during this encounter.   Karle Plumber, MD

## 2020-02-17 NOTE — Telephone Encounter (Signed)
Medication Refill - Medication: amLODipine (NORVASC) 10 MG tablet  carvedilol (COREG) 6.25 MG tablet   Has the patient contacted their pharmacy? Yes.   (Agent: If no, request that the patient contact the pharmacy for the refill.) (Agent: If yes, when and what did the pharmacy advise?)  Preferred Pharmacy (with phone number or street name): [id number] [place], [place]  [address], [place] [address]  Phone:  [phone] Fax:  [phone]   Agent: Please be advised that RX refills may take up to 3 business days. We ask that you follow-up with your pharmacy.

## 2020-02-18 DIAGNOSIS — T8249XA Other complication of vascular dialysis catheter, initial encounter: Secondary | ICD-10-CM | POA: Diagnosis not present

## 2020-02-18 DIAGNOSIS — Z992 Dependence on renal dialysis: Secondary | ICD-10-CM | POA: Diagnosis not present

## 2020-02-18 DIAGNOSIS — N2581 Secondary hyperparathyroidism of renal origin: Secondary | ICD-10-CM | POA: Diagnosis not present

## 2020-02-18 DIAGNOSIS — N186 End stage renal disease: Secondary | ICD-10-CM | POA: Diagnosis not present

## 2020-02-18 NOTE — Telephone Encounter (Signed)
Spoke to patient and informed on PCP advising. Pt. Understood.  

## 2020-02-19 DIAGNOSIS — Z992 Dependence on renal dialysis: Secondary | ICD-10-CM | POA: Diagnosis not present

## 2020-02-19 DIAGNOSIS — N186 End stage renal disease: Secondary | ICD-10-CM | POA: Diagnosis not present

## 2020-02-19 DIAGNOSIS — I129 Hypertensive chronic kidney disease with stage 1 through stage 4 chronic kidney disease, or unspecified chronic kidney disease: Secondary | ICD-10-CM | POA: Diagnosis not present

## 2020-02-20 ENCOUNTER — Other Ambulatory Visit: Payer: Self-pay | Admitting: Nurse Practitioner

## 2020-02-20 DIAGNOSIS — I10 Essential (primary) hypertension: Secondary | ICD-10-CM

## 2020-02-22 DIAGNOSIS — N186 End stage renal disease: Secondary | ICD-10-CM | POA: Diagnosis not present

## 2020-02-22 DIAGNOSIS — Z992 Dependence on renal dialysis: Secondary | ICD-10-CM | POA: Diagnosis not present

## 2020-02-22 DIAGNOSIS — T8249XA Other complication of vascular dialysis catheter, initial encounter: Secondary | ICD-10-CM | POA: Diagnosis not present

## 2020-02-22 DIAGNOSIS — N2581 Secondary hyperparathyroidism of renal origin: Secondary | ICD-10-CM | POA: Diagnosis not present

## 2020-02-23 DIAGNOSIS — T8249XA Other complication of vascular dialysis catheter, initial encounter: Secondary | ICD-10-CM | POA: Diagnosis not present

## 2020-02-23 DIAGNOSIS — N186 End stage renal disease: Secondary | ICD-10-CM | POA: Diagnosis not present

## 2020-02-23 DIAGNOSIS — Z992 Dependence on renal dialysis: Secondary | ICD-10-CM | POA: Diagnosis not present

## 2020-02-23 DIAGNOSIS — N2581 Secondary hyperparathyroidism of renal origin: Secondary | ICD-10-CM | POA: Diagnosis not present

## 2020-02-24 ENCOUNTER — Other Ambulatory Visit: Payer: Self-pay

## 2020-02-24 ENCOUNTER — Ambulatory Visit (INDEPENDENT_AMBULATORY_CARE_PROVIDER_SITE_OTHER): Payer: BC Managed Care – PPO | Admitting: Physician Assistant

## 2020-02-24 ENCOUNTER — Ambulatory Visit (HOSPITAL_COMMUNITY)
Admission: RE | Admit: 2020-02-24 | Discharge: 2020-02-24 | Disposition: A | Discharge status: Home / Self Care | Payer: BC Managed Care – PPO | Source: Ambulatory Visit | Attending: Vascular Surgery | Admitting: Vascular Surgery

## 2020-02-24 ENCOUNTER — Encounter: Payer: Self-pay | Admitting: Physician Assistant

## 2020-02-24 VITALS — BP 150/89 | HR 86 | Temp 97.6°F | Resp 20 | Ht 68.0 in | Wt 313.1 lb

## 2020-02-24 DIAGNOSIS — Z992 Dependence on renal dialysis: Secondary | ICD-10-CM

## 2020-02-24 DIAGNOSIS — N186 End stage renal disease: Secondary | ICD-10-CM

## 2020-02-24 NOTE — Progress Notes (Signed)
POST OPERATIVE OFFICE NOTE    CC:  F/u for surgery  HPI:  This is a 54 y.o. male who is s/p Left forearm av fistula creation  on 11/11/19 by Dr. Carlis Abbott.  He is on HD via right TDC placed by Dr. Augustin Coupe.  He states his left UE is feeling better with occasional numbness.  Pt returns today for follow up.    Allergies  Allergen Reactions  . Amlodipine Other (See Comments)    Dizziness reported    Current Outpatient Medications  Medication Sig Dispense Refill  . aspirin EC 81 MG tablet Take 1 tablet (81 mg total) by mouth daily. 90 tablet 3  . atorvastatin (LIPITOR) 40 MG tablet Take 1 tablet (40 mg total) by mouth daily. 90 tablet 0  . Blood Glucose Monitoring Suppl (CONTOUR NEXT MONITOR) w/Device KIT 1 Device by Does not apply route 4 (four) times daily - after meals and at bedtime. 1 kit 0  . carvedilol (COREG) 6.25 MG tablet Take 1 tablet (6.25 mg total) by mouth 2 (two) times daily with a meal. 180 tablet 1  . enalapril (VASOTEC) 5 MG tablet TAKE 1 TABLET BY MOUTH EVERY DAY 90 tablet 0  . glucose blood test strip Use as instructed. Inject into the skin 3 times daily E11.65 200 each 12  . hydrALAZINE (APRESOLINE) 25 MG tablet Take 1 tablet (25 mg total) by mouth 3 (three) times daily. 270 tablet 0  . insulin lispro (HUMALOG KWIKPEN) 100 UNIT/ML KwikPen Inject 0.1-0.15 mLs (10-15 Units total) into the skin 2 (two) times daily before a meal. 15 mL 6  . Insulin Pen Needle (B-D ULTRAFINE III SHORT PEN) 31G X 8 MM MISC 1 application by Does not apply route 3 (three) times daily. E11.65 200 each 11  . Lancet Devices (MICROLET NEXT LANCING DEVICE) MISC 1 Device by Does not apply route 3 (three) times daily. 1 each 11  . levETIRAcetam (KEPPRA XR) 500 MG 24 hr tablet Take 2 tablets (1,000 mg total) by mouth daily. 180 tablet 3  . Microlet Lancets MISC 1 Device by Does not apply route 3 (three) times daily. E11.65 200 each 11  . Semaglutide,0.25 or 0.5MG/DOS, (OZEMPIC, 0.25 OR 0.5 MG/DOSE,) 2  MG/1.5ML SOPN Inject 0.375 mg into the skin once a week. 9.44 mg plus 5 clicks 3 pen 6   No current facility-administered medications for this visit.     ROS:  See HPI  Physical Exam:    Findings:  +--------------------+----------+-----------------+--------+  AVF         PSV (cm/s)Flow Vol (mL/min)Comments  +--------------------+----------+-----------------+--------+  Native artery inflow  292     1203          +--------------------+----------+-----------------+--------+  AVF Anastomosis     882                 +--------------------+----------+-----------------+--------+     +------------+----------+-------------+----------+--------+  OUTFLOW VEINPSV (cm/s)Diameter (cm)Depth (cm)Describe  +------------+----------+-------------+----------+--------+  AC Fossa    150    0.55     0.59        +------------+----------+-------------+----------+--------+  Prox Forearm  150    0.58     0.59        +------------+----------+-------------+----------+--------+  Mid Forearm   320    0.52     0.55        +------------+----------+-------------+----------+--------+  Dist Forearm  721    0.30     0.36        +------------+----------+-------------+----------+--------+    Summary:  Patent  arteriovenous fistula.    Incision:  Well healed incision Extremities:  Grip 5/5, palpable radial pulse, thrill is only palpable at anastomosis.  And faint at [facility] location. Lungs: non labored breathing   Assessment/Plan:  This is a 54 y.o. male who is s/p: Left radial cephalic av fistula  The fistula has matured more since his last duplex, but it is deep and not large enough in diameter to be accessed.  I told him we could elevate it and hopefully improve the anastomosis where the diameter is 0.3 or we would start over in his upper left arm.  He states he is not ready to  do more surgery at this time.  He is very frustrated with all his new rules involving his health since having COVID in January 2021.    He will think about his options and f/u in 2-3 weeks with Dr. Carlis Abbott to discuss options again and hopefully make a decision.   Roxy Horseman PA-C [facility] [phone]  Clinic MD:  Oneida Alar

## 2020-02-25 DIAGNOSIS — T8249XA Other complication of vascular dialysis catheter, initial encounter: Secondary | ICD-10-CM | POA: Diagnosis not present

## 2020-02-25 DIAGNOSIS — N186 End stage renal disease: Secondary | ICD-10-CM | POA: Diagnosis not present

## 2020-02-25 DIAGNOSIS — N2581 Secondary hyperparathyroidism of renal origin: Secondary | ICD-10-CM | POA: Diagnosis not present

## 2020-02-25 DIAGNOSIS — Z992 Dependence on renal dialysis: Secondary | ICD-10-CM | POA: Diagnosis not present

## 2020-02-29 ENCOUNTER — Telehealth: Payer: Self-pay

## 2020-02-29 NOTE — Telephone Encounter (Signed)
RN attempted to reach patient multiple times; patient unable to be reached and has failed to return call to the office; pre visit and procedure appts have been cancelled;

## 2020-02-29 NOTE — Telephone Encounter (Signed)
Attempted to reach patient concerning missed/no show for pre visit appt scheduled for 4:30 today; unable to leave a VM; if patient cannot be reached by 5:00 pm today pre visit and procedure appts will be cancelled;

## 2020-03-01 DIAGNOSIS — T8249XA Other complication of vascular dialysis catheter, initial encounter: Secondary | ICD-10-CM | POA: Diagnosis not present

## 2020-03-01 DIAGNOSIS — Z992 Dependence on renal dialysis: Secondary | ICD-10-CM | POA: Diagnosis not present

## 2020-03-01 DIAGNOSIS — N186 End stage renal disease: Secondary | ICD-10-CM | POA: Diagnosis not present

## 2020-03-01 DIAGNOSIS — N2581 Secondary hyperparathyroidism of renal origin: Secondary | ICD-10-CM | POA: Diagnosis not present

## 2020-03-03 ENCOUNTER — Other Ambulatory Visit: Payer: Self-pay

## 2020-03-03 ENCOUNTER — Encounter: Payer: Self-pay | Admitting: Nurse Practitioner

## 2020-03-03 ENCOUNTER — Ambulatory Visit: Payer: BC Managed Care – PPO | Attending: Nurse Practitioner | Admitting: Nurse Practitioner

## 2020-03-03 DIAGNOSIS — Z992 Dependence on renal dialysis: Secondary | ICD-10-CM | POA: Insufficient documentation

## 2020-03-03 DIAGNOSIS — E1121 Type 2 diabetes mellitus with diabetic nephropathy: Secondary | ICD-10-CM

## 2020-03-03 DIAGNOSIS — Z8249 Family history of ischemic heart disease and other diseases of the circulatory system: Secondary | ICD-10-CM | POA: Insufficient documentation

## 2020-03-03 DIAGNOSIS — E782 Mixed hyperlipidemia: Secondary | ICD-10-CM

## 2020-03-03 DIAGNOSIS — F5101 Primary insomnia: Secondary | ICD-10-CM | POA: Diagnosis not present

## 2020-03-03 DIAGNOSIS — Z794 Long term (current) use of insulin: Secondary | ICD-10-CM | POA: Diagnosis not present

## 2020-03-03 DIAGNOSIS — E1165 Type 2 diabetes mellitus with hyperglycemia: Secondary | ICD-10-CM

## 2020-03-03 DIAGNOSIS — Z79899 Other long term (current) drug therapy: Secondary | ICD-10-CM | POA: Diagnosis not present

## 2020-03-03 DIAGNOSIS — E1122 Type 2 diabetes mellitus with diabetic chronic kidney disease: Secondary | ICD-10-CM | POA: Diagnosis not present

## 2020-03-03 DIAGNOSIS — T8249XA Other complication of vascular dialysis catheter, initial encounter: Secondary | ICD-10-CM | POA: Diagnosis not present

## 2020-03-03 DIAGNOSIS — E1169 Type 2 diabetes mellitus with other specified complication: Secondary | ICD-10-CM | POA: Diagnosis not present

## 2020-03-03 DIAGNOSIS — Z9049 Acquired absence of other specified parts of digestive tract: Secondary | ICD-10-CM | POA: Diagnosis not present

## 2020-03-03 DIAGNOSIS — I120 Hypertensive chronic kidney disease with stage 5 chronic kidney disease or end stage renal disease: Secondary | ICD-10-CM | POA: Diagnosis not present

## 2020-03-03 DIAGNOSIS — I10 Essential (primary) hypertension: Secondary | ICD-10-CM

## 2020-03-03 DIAGNOSIS — Z833 Family history of diabetes mellitus: Secondary | ICD-10-CM | POA: Insufficient documentation

## 2020-03-03 DIAGNOSIS — N186 End stage renal disease: Secondary | ICD-10-CM | POA: Insufficient documentation

## 2020-03-03 DIAGNOSIS — N2581 Secondary hyperparathyroidism of renal origin: Secondary | ICD-10-CM | POA: Diagnosis not present

## 2020-03-03 DIAGNOSIS — G4733 Obstructive sleep apnea (adult) (pediatric): Secondary | ICD-10-CM

## 2020-03-03 DIAGNOSIS — IMO0002 Reserved for concepts with insufficient information to code with codable children: Secondary | ICD-10-CM

## 2020-03-03 MED ORDER — HYDRALAZINE HCL 25 MG PO TABS: 25.0000 mg | ORAL_TABLET | Freq: Three times a day (TID) | ORAL | 0 refills | Status: DC

## 2020-03-03 MED ORDER — INSULIN LISPRO (1 UNIT DIAL) 100 UNIT/ML (KWIKPEN): 10.0000 [IU] | PEN_INJECTOR | Freq: Two times a day (BID) | SUBCUTANEOUS | 6 refills | Status: DC

## 2020-03-03 MED ORDER — ATORVASTATIN CALCIUM 40 MG PO TABS: 40.0000 mg | ORAL_TABLET | Freq: Every day | ORAL | 0 refills | Status: DC

## 2020-03-03 MED ORDER — BD PEN NEEDLE SHORT U/F 31G X 8 MM MISC: 1.0000 "application " | Freq: Three times a day (TID) | 11 refills | Status: DC

## 2020-03-03 MED ORDER — TRAZODONE HCL 50 MG PO TABS: 50.0000 mg | ORAL_TABLET | Freq: Every day | ORAL | 2 refills | Status: DC

## 2020-03-03 NOTE — Progress Notes (Signed)
Virtual Visit via Telephone Note Due to national recommendations of social distancing due to [place] 19, telehealth visit is felt to be most appropriate for this patient at this time.  I discussed the limitations, risks, security and privacy concerns of performing an evaluation and management service by telephone and the availability of in person appointments. I also discussed with the patient that there may be a patient responsible charge related to this service. The patient expressed understanding and agreed to proceed.    I connected with [name] on 03/03/20  at   3:10 PM EDT  EDT by telephone and verified that I am speaking with the correct person using two identifiers.   Consent I discussed the limitations, risks, security and privacy concerns of performing an evaluation and management service by telephone and the availability of in person appointments. I also discussed with the patient that there may be a patient responsible charge related to this service. The patient expressed understanding and agreed to proceed.   Location of Patient: Private Residence   Location of Provider: [place] and [employer] Office    Persons participating in Telemedicine visit: [name] FNP-BC [place]    History of Present Illness: Telemedicine visit for: F/U PMH: ESRD on HD, HTN, Seizures, HPL, DM 2  Needs to be evaluated for sleep apnea.   Insomnia Taking tylenol PM at night which has not been effective. He has trouble falling asleep. When he falls asleep he can only sleep for a few hours at a time.  DM TYPE 2 Improving. Down from 9 to 8.1. Currently taking humalog 10-15 units BID and ozempic 0.75mg  weekly. Blood glucose readings at home mostly averaging between 140-160s.  Lab Results  Component Value Date   HGBA1C 8.1 (H) 11/09/2019   Lab Results  Component Value Date   LDLCALC 80 11/09/2019    Essential Hypertension Taking carvedilol 6.25 mg BID,  hydralazine 25 mg TID and enalapril 5 mg daily. Denies chest pain, shortness of breath, palpitations, lightheadedness, dizziness, headaches or BLE edema.  BP Readings from Last 3 Encounters:  02/24/20 (!) 150/89  02/07/20 140/80  02/01/20 (!) 143/84      Past Medical History:  Diagnosis Date  . Abscess   . AKI (acute kidney injury) ([place]) 08/05/2017  . Bell's palsy   . CKD (chronic kidney disease)    Dr. McDiarmind   . Headache   . High cholesterol   . Hypertension   . Left shoulder pain 10/26/2013   S/p injection on 10/26/13   . Renal insufficiency 02/14/2014  . Seizures ([place]) 08/04/2017   "related to high blood sugars" (08/05/2017)  . Type II diabetes mellitus ([place])     Past Surgical History:  Procedure Laterality Date  . AV FISTULA PLACEMENT Left 11/11/2019   Procedure: LEFT FOREARM RADIOCEPHALIC ARTERIOVENOUS (AV) FISTULA CREATION;  Surgeon: [name], MD;  Location: [place];  Service: Vascular;  Laterality: Left;  . CHOLECYSTECTOMY    . Eyelid surgery Right   . I & D EXTREMITY  11/11/2011   Procedure: IRRIGATION AND DEBRIDEMENT EXTREMITY;  Surgeon: Merrie Roof, MD;  Location: [place];  Service: General;  Laterality: Right;  I&D Right Thigh Abscess  . IR FLUORO GUIDE CV LINE RIGHT  08/12/2019  . IR US GUIDE VASC ACCESS RIGHT  08/12/2019    Family History  Problem Relation Age of Onset  . Diabetes Mother   . Heart disease Mother 9  . Diabetes Sister   .  Diabetes Brother   . Diabetes Brother   . Cancer Maternal Uncle        type unknown  . Stroke Neg Hx     Social History   Socioeconomic History  . Marital status: Divorced    Spouse name: Not on file  . Number of children: 3  . Years of education: Not on file  . Highest education level: Not on file  Occupational History  . Occupation: maintenance tech  Tobacco Use  . Smoking status: Never Smoker  . Smokeless tobacco: Never Used  Vaping Use  . Vaping Use: Never used  Substance and Sexual Activity  .  Alcohol use: Not Currently  . Drug use: No  . Sexual activity: Yes  Other Topics Concern  . 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  Did not graduate high school.  Has 3 adult children in [place].  Divorced.  Lives with girlfriend [name].                   Social Determinants of Health   Financial Resource Strain:   . Difficulty of Paying Living Expenses:   Food Insecurity:   . Worried About Charity fundraiser in the Last Year:   . Arboriculturist in the Last Year:   Transportation Needs:   . Film/video editor (Medical):   Marland Kitchen Lack of Transportation (Non-Medical):   Physical Activity: Unknown  . Days of Exercise per Week: 2 days  . Minutes of Exercise per Session: Not on file  Stress:   . Feeling of Stress :   Social Connections:   . Frequency of Communication with Friends and Family:   . Frequency of Social Gatherings with Friends and Family:   . Attends Religious Services:   . Active Member of Clubs or Organizations:   . Attends Archivist Meetings:   Marland Kitchen Marital Status:      Observations/Objective: Awake, alert and oriented x 3   Review of Systems  Constitutional: Negative for fever, malaise/fatigue and weight loss.  HENT: Negative.  Negative for nosebleeds.   Eyes: Negative.  Negative for blurred vision, double vision and photophobia.  Respiratory: Negative.  Negative for cough and shortness of breath.   Cardiovascular: Negative.  Negative for chest pain, palpitations and leg swelling.  Gastrointestinal: Negative.  Negative for heartburn, nausea and vomiting.  Musculoskeletal: Negative.  Negative for myalgias.  Neurological: Negative.  Negative for dizziness, focal weakness, seizures and headaches.  Psychiatric/Behavioral: Negative for suicidal ideas. The patient has insomnia.     Assessment and Plan: Markies was seen today for follow-up.  Diagnoses and all orders for  this visit:  Uncontrolled type 2 diabetes mellitus with diabetic nephropathy, with long-term current use of insulin (HCC) -     Insulin Pen Needle (B-D ULTRAFINE III SHORT PEN) 31G X 8 MM MISC; 1 application by Does not apply route 3 (three) times daily. E11.65 -     insulin lispro (HUMALOG KWIKPEN) 100 UNIT/ML KwikPen; Inject 0.1-0.15 mLs (10-15 Units total) into the skin 2 (two) times daily before a meal. Continue blood sugar control as discussed in office today, low carbohydrate diet, and regular physical exercise as tolerated, 150 minutes per week (30 min each day, 5 days per week, or 50 min 3 days per week). Keep blood sugar logs with fasting goal of 90-130 mg/dl, post prandial (after you eat) less than 180.  For  Hypoglycemia: BS <60 and Hyperglycemia BS >400; contact the clinic ASAP. Annual eye exams and foot exams are recommended.   Mixed hyperlipidemia due to type 2 diabetes mellitus (HCC) -     atorvastatin (LIPITOR) 40 MG tablet; Take 1 tablet (40 mg total) by mouth daily. INSTRUCTIONS: Work on a low fat, heart healthy diet and participate in regular aerobic exercise program by working out at least 150 minutes per week; 5 days a week-30 minutes per day. Avoid red meat/beef/steak,  fried foods. junk foods, sodas, sugary drinks, unhealthy snacking, alcohol and smoking.  Drink at least 80 oz of water per day and monitor your carbohydrate intake daily.    Essential hypertension -     hydrALAZINE (APRESOLINE) 25 MG tablet; Take 1 tablet (25 mg total) by mouth 3 (three) times daily. -     atorvastatin (LIPITOR) 40 MG tablet; Take 1 tablet (40 mg total) by mouth daily. Continue all antihypertensives as prescribed.  Remember to bring in your blood pressure log with you for your follow up appointment.  DASH/Mediterranean Diets are healthier choices for HTN.    OSA (obstructive sleep apnea) -     Split night study; Future  Primary insomnia -     traZODone (DESYREL) 50 MG tablet; Take 1-2  tablets (50-100 mg total) by mouth at bedtime.     Follow Up Instructions Return in about 3 months (around 06/03/2020).     I discussed the assessment and treatment plan with the patient. The patient was provided an opportunity to ask questions and all were answered. The patient agreed with the plan and demonstrated an understanding of the instructions.   The patient was advised to call back or seek an in-person evaluation if the symptoms worsen or if the condition fails to improve as anticipated.  I provided 18 minutes of non-face-to-face time during this encounter including median intraservice time, reviewing previous notes, labs, imaging, medications and explaining diagnosis and management.  Gildardo Pounds, FNP-BC

## 2020-03-07 ENCOUNTER — Other Ambulatory Visit: Payer: Self-pay | Admitting: Nurse Practitioner

## 2020-03-07 ENCOUNTER — Other Ambulatory Visit: Payer: BC Managed Care – PPO

## 2020-03-07 DIAGNOSIS — R972 Elevated prostate specific antigen [PSA]: Secondary | ICD-10-CM

## 2020-03-07 DIAGNOSIS — IMO0002 Reserved for concepts with insufficient information to code with codable children: Secondary | ICD-10-CM

## 2020-03-07 DIAGNOSIS — E1169 Type 2 diabetes mellitus with other specified complication: Secondary | ICD-10-CM

## 2020-03-07 DIAGNOSIS — E1121 Type 2 diabetes mellitus with diabetic nephropathy: Secondary | ICD-10-CM

## 2020-03-07 DIAGNOSIS — N184 Chronic kidney disease, stage 4 (severe): Secondary | ICD-10-CM

## 2020-03-07 DIAGNOSIS — E782 Mixed hyperlipidemia: Secondary | ICD-10-CM

## 2020-03-07 DIAGNOSIS — E1165 Type 2 diabetes mellitus with hyperglycemia: Secondary | ICD-10-CM

## 2020-03-08 DIAGNOSIS — N2581 Secondary hyperparathyroidism of renal origin: Secondary | ICD-10-CM | POA: Diagnosis not present

## 2020-03-08 DIAGNOSIS — Z992 Dependence on renal dialysis: Secondary | ICD-10-CM | POA: Diagnosis not present

## 2020-03-08 DIAGNOSIS — Z23 Encounter for immunization: Secondary | ICD-10-CM | POA: Diagnosis not present

## 2020-03-08 DIAGNOSIS — T8249XA Other complication of vascular dialysis catheter, initial encounter: Secondary | ICD-10-CM | POA: Diagnosis not present

## 2020-03-08 DIAGNOSIS — N186 End stage renal disease: Secondary | ICD-10-CM | POA: Diagnosis not present

## 2020-03-10 DIAGNOSIS — N2581 Secondary hyperparathyroidism of renal origin: Secondary | ICD-10-CM | POA: Diagnosis not present

## 2020-03-10 DIAGNOSIS — T8249XA Other complication of vascular dialysis catheter, initial encounter: Secondary | ICD-10-CM | POA: Diagnosis not present

## 2020-03-10 DIAGNOSIS — Z992 Dependence on renal dialysis: Secondary | ICD-10-CM | POA: Diagnosis not present

## 2020-03-10 DIAGNOSIS — N186 End stage renal disease: Secondary | ICD-10-CM | POA: Diagnosis not present

## 2020-03-10 DIAGNOSIS — Z23 Encounter for immunization: Secondary | ICD-10-CM | POA: Diagnosis not present

## 2020-03-13 DIAGNOSIS — Z992 Dependence on renal dialysis: Secondary | ICD-10-CM | POA: Diagnosis not present

## 2020-03-13 DIAGNOSIS — N186 End stage renal disease: Secondary | ICD-10-CM | POA: Diagnosis not present

## 2020-03-13 DIAGNOSIS — T8249XA Other complication of vascular dialysis catheter, initial encounter: Secondary | ICD-10-CM | POA: Diagnosis not present

## 2020-03-13 DIAGNOSIS — N2581 Secondary hyperparathyroidism of renal origin: Secondary | ICD-10-CM | POA: Diagnosis not present

## 2020-03-14 ENCOUNTER — Other Ambulatory Visit: Payer: Self-pay

## 2020-03-14 ENCOUNTER — Encounter: Payer: Self-pay | Admitting: Vascular Surgery

## 2020-03-14 ENCOUNTER — Ambulatory Visit (INDEPENDENT_AMBULATORY_CARE_PROVIDER_SITE_OTHER): Payer: BC Managed Care – PPO | Admitting: Vascular Surgery

## 2020-03-14 VITALS — BP 140/79 | HR 81 | Temp 94.8°F | Resp 18 | Ht 68.0 in | Wt 315.0 lb

## 2020-03-14 DIAGNOSIS — Z992 Dependence on renal dialysis: Secondary | ICD-10-CM | POA: Diagnosis not present

## 2020-03-14 DIAGNOSIS — N186 End stage renal disease: Secondary | ICD-10-CM

## 2020-03-14 NOTE — Progress Notes (Signed)
Patient name: Jeremy [name] MRN: [id number] DOB: 05-Nov-1965 Sex: male  REASON FOR VISIT: Follow-up to discuss slow to mature left radiocephalic AV fistula  HPI: Jeremy [name] is a 54 y.o. male with end-stage renal disease now on hemodialysis Monday Wednesday Friday, diabetes, hypertension that presents to discuss slow to mature left arm AV fistula.  He previously underwent a left radiocephalic AV fistula for stage IV CKD on 11/11/2019 by myself.  He since progressed to ESRD and has a right IJ tunneled catheter placed by Dr. Augustin Coupe.  Now on dialysis Monday Wednesday Friday.  He states he really does not like getting stuck with needles.  States he is fearful of needles now.  States dialysis has not made any attempt to stick his fistula up to this point.  He is right-handed.  Currently on disability not working.  He complains of numbness in the left hand in all 5 digits that has been intermittent and at times severe waking him up from sleep.  No tissue loss or weakness in left hand.  Feels that he can live with it.    Past Medical History:  Diagnosis Date  . Abscess   . AKI (acute kidney injury) ([place]) 08/05/2017  . Bell's palsy   . CKD (chronic kidney disease)    Dr. McDiarmind   . Headache   . High cholesterol   . Hypertension   . Left shoulder pain 10/26/2013   S/p injection on 10/26/13   . Renal insufficiency 02/14/2014  . Seizures ([place]) 08/04/2017   "related to high blood sugars" (08/05/2017)  . Type II diabetes mellitus ([place])     Past Surgical History:  Procedure Laterality Date  . AV FISTULA PLACEMENT Left 11/11/2019   Procedure: LEFT FOREARM RADIOCEPHALIC ARTERIOVENOUS (AV) FISTULA CREATION;  Surgeon: [name], MD;  Location: [facility];  Service: Vascular;  Laterality: Left;  . CHOLECYSTECTOMY    . Eyelid surgery Right   . I & D EXTREMITY  11/11/2011   Procedure: IRRIGATION AND DEBRIDEMENT EXTREMITY;  Surgeon: Merrie Roof, MD;  Location: [place];  Service: General;  Laterality:  Right;  I&D Right Thigh Abscess  . IR FLUORO GUIDE CV LINE RIGHT  08/12/2019  . IR US GUIDE VASC ACCESS RIGHT  08/12/2019    Family History  Problem Relation Age of Onset  . Diabetes Mother   . Heart disease Mother 57  . Diabetes Sister   . Diabetes Brother   . Diabetes Brother   . Cancer Maternal Uncle        type unknown  . Stroke Neg Hx     SOCIAL HISTORY: Social History   Tobacco Use  . Smoking status: Never Smoker  . Smokeless tobacco: Never Used  Substance Use Topics  . Alcohol use: Not Currently    Allergies  Allergen Reactions  . Amlodipine Other (See Comments)    Dizziness reported    Current Outpatient Medications  Medication Sig Dispense Refill  . aspirin EC 81 MG tablet Take 1 tablet (81 mg total) by mouth daily. 90 tablet 3  . atorvastatin (LIPITOR) 40 MG tablet Take 1 tablet (40 mg total) by mouth daily. 90 tablet 0  . Blood Glucose Monitoring Suppl (CONTOUR NEXT MONITOR) w/Device KIT 1 Device by Does not apply route 4 (four) times daily - after meals and at bedtime. 1 kit 0  . carvedilol (COREG) 6.25 MG tablet Take 1 tablet (6.25 mg total) by mouth 2 (two) times daily with a meal. 180  tablet 1  . enalapril (VASOTEC) 5 MG tablet TAKE 1 TABLET BY MOUTH EVERY DAY 90 tablet 0  . glucose blood test strip Use as instructed. Inject into the skin 3 times daily E11.65 200 each 12  . hydrALAZINE (APRESOLINE) 25 MG tablet Take 1 tablet (25 mg total) by mouth 3 (three) times daily. 270 tablet 0  . insulin lispro (HUMALOG KWIKPEN) 100 UNIT/ML KwikPen Inject 0.1-0.15 mLs (10-15 Units total) into the skin 2 (two) times daily before a meal. 15 mL 6  . Insulin Pen Needle (B-D ULTRAFINE III SHORT PEN) 31G X 8 MM MISC 1 application by Does not apply route 3 (three) times daily. E11.65 200 each 11  . Lancet Devices (MICROLET NEXT LANCING DEVICE) MISC 1 Device by Does not apply route 3 (three) times daily. 1 each 11  . levETIRAcetam (KEPPRA XR) 500 MG 24 hr tablet Take 2 tablets  (1,000 mg total) by mouth daily. 180 tablet 3  . lidocaine-prilocaine (EMLA) cream SMARTSIG:Sparingly Topical    . Microlet Lancets MISC 1 Device by Does not apply route 3 (three) times daily. E11.65 200 each 11  . Semaglutide,0.25 or 0.5MG/DOS, (OZEMPIC, 0.25 OR 0.5 MG/DOSE,) 2 MG/1.5ML SOPN Inject 0.375 mg into the skin once a week. 0.34 mg plus 5 clicks 3 pen 6  . traZODone (DESYREL) 50 MG tablet Take 1-2 tablets (50-100 mg total) by mouth at bedtime. 60 tablet 2  . VELPHORO 500 MG chewable tablet Chew 500 mg by mouth 3 (three) times daily.    Marland Kitchen VITAMIN D PO Take by mouth.     No current facility-administered medications for this visit.    REVIEW OF SYSTEMS:  _0  denotes positive finding, _1  denotes negative finding Cardiac  Comments:  Chest pain or chest pressure:    Shortness of breath upon exertion:    Short of breath when lying flat:    Irregular heart rhythm:        Vascular    Pain in calf, thigh, or hip brought on by ambulation:    Pain in feet at night that wakes you up from your sleep:     Blood clot in your veins:    Leg swelling:         Pulmonary    Oxygen at home:    Productive cough:     Wheezing:         Neurologic    Sudden weakness in arms or legs:     Sudden numbness in arms or legs:     Sudden onset of difficulty speaking or slurred speech:    Temporary loss of vision in one eye:     Problems with dizziness:         Gastrointestinal    Blood in stool:     Vomited blood:         Genitourinary    Burning when urinating:     Blood in urine:        Psychiatric    Major depression:         Hematologic    Bleeding problems:    Problems with blood clotting too easily:        Skin    Rashes or ulcers:        Constitutional    Fever or chills:      PHYSICAL EXAM: Vitals:   03/14/20 0826  BP: 140/79  Pulse: 81  Resp: 18  Temp: (!) 94.8 F (34.9 C)  TempSrc: Temporal  SpO2: 100%  Weight: (!) 315 lb (142.9 kg)  Height: _0  (1.727 m)      GENERAL: The patient is a well-nourished male, in no acute distress. The vital signs are documented above. CARDIAC: There is a regular rate and rhythm.  VASCULAR:  Left radial and ulnar pulses palpable Left brachial pulse palpable Left radiocephalic fistula with good thrill in the distal to mid forearm. No tissue loss in the left hand. PULMONARY: There is good air exchange bilaterally without wheezing or rales. ABDOMEN: Soft and non-tender with normal pitched bowel sounds.  MUSCULOSKELETAL: There are no major deformities or cyanosis. NEUROLOGIC: No focal weakness or paresthesias are detected. SKIN: There are no ulcers or rashes noted. PSYCHIATRIC: The patient has a normal affect.  DATA:   Previous left upper extremity fistula duplex on 02/24/2020 shows excellent flow volumes of 1203 mL/min in the fistula, the fistula is small in the distal forearm measuring 3 mm but 5-5.5 mm in the mid forearm to the [facility] fossa.  Assessment/Plan:  54 year old male previously had a left radiocephalic AV fistula on 0/98/1191 for stage IV CKD that has been slow to mature.  He has now progressed to ESRD and has a right IJ tunneled catheter placed by Dr. Augustin Coupe.  On exam today he does have a good thrill in the distal to mid forearm.  In further discussion, he is experiencing some numbness in the left hand in digits 1 through 5 that could be concerning for steal.  That being said he has easily palpable radial and ulnar pulses at the wrist and a radiocephalic fistula would be the lowest risk for steal and hence we discussed that converting his fistula to more proximal in the upper arm like a brachiocephalic could certainly worsen his symptoms.  I looked at his fistula with ultrasound myself today and he does have at least four side branches in the distal to proximal forearm that could be ligated to further facilitate maturation.  I discussed with him that there would be some risk that this does not work and then we can  reevaluate converting him to a more proximal upper arm fistula.  We also discussed that he is fearful of needles and discussed the only other return it would be peritoneal dialysis and encouraged that he talk to Dr. Hollie Salk about this in more detail.  After discussion of all options, will schedule for left arm AVF revision with sidebranch ligation and try to get the radiocepahlic AVF more mature.  Risks benefits discussed in detail.  Schedule for non-dialysis day.     [name], MD [facility] Office: [phone]

## 2020-03-15 ENCOUNTER — Encounter: Payer: BC Managed Care – PPO | Admitting: Internal Medicine

## 2020-03-17 ENCOUNTER — Other Ambulatory Visit: Payer: Self-pay

## 2020-03-17 DIAGNOSIS — T8249XA Other complication of vascular dialysis catheter, initial encounter: Secondary | ICD-10-CM | POA: Diagnosis not present

## 2020-03-17 DIAGNOSIS — N186 End stage renal disease: Secondary | ICD-10-CM | POA: Diagnosis not present

## 2020-03-17 DIAGNOSIS — N2581 Secondary hyperparathyroidism of renal origin: Secondary | ICD-10-CM | POA: Diagnosis not present

## 2020-03-17 DIAGNOSIS — Z992 Dependence on renal dialysis: Secondary | ICD-10-CM | POA: Diagnosis not present

## 2020-03-20 DIAGNOSIS — T8249XA Other complication of vascular dialysis catheter, initial encounter: Secondary | ICD-10-CM | POA: Diagnosis not present

## 2020-03-20 DIAGNOSIS — Z992 Dependence on renal dialysis: Secondary | ICD-10-CM | POA: Diagnosis not present

## 2020-03-20 DIAGNOSIS — N186 End stage renal disease: Secondary | ICD-10-CM | POA: Diagnosis not present

## 2020-03-20 DIAGNOSIS — N2581 Secondary hyperparathyroidism of renal origin: Secondary | ICD-10-CM | POA: Diagnosis not present

## 2020-03-21 ENCOUNTER — Other Ambulatory Visit (HOSPITAL_COMMUNITY)
Admission: RE | Admit: 2020-03-21 | Discharge: 2020-03-21 | Disposition: A | Discharge status: Home / Self Care | Payer: BC Managed Care – PPO | Source: Ambulatory Visit | Attending: Vascular Surgery | Admitting: Vascular Surgery

## 2020-03-21 DIAGNOSIS — Z20822 Contact with and (suspected) exposure to covid-19: Secondary | ICD-10-CM | POA: Diagnosis not present

## 2020-03-21 DIAGNOSIS — Z01812 Encounter for preprocedural laboratory examination: Secondary | ICD-10-CM | POA: Insufficient documentation

## 2020-03-21 DIAGNOSIS — I129 Hypertensive chronic kidney disease with stage 1 through stage 4 chronic kidney disease, or unspecified chronic kidney disease: Secondary | ICD-10-CM | POA: Diagnosis not present

## 2020-03-21 DIAGNOSIS — N186 End stage renal disease: Secondary | ICD-10-CM | POA: Diagnosis not present

## 2020-03-21 DIAGNOSIS — Z992 Dependence on renal dialysis: Secondary | ICD-10-CM | POA: Diagnosis not present

## 2020-03-21 LAB — SARS CORONAVIRUS 2 (TAT 6-24 HRS): SARS Coronavirus 2: NEGATIVE

## 2020-03-22 ENCOUNTER — Encounter (HOSPITAL_COMMUNITY): Payer: Self-pay | Admitting: Vascular Surgery

## 2020-03-22 DIAGNOSIS — N2581 Secondary hyperparathyroidism of renal origin: Secondary | ICD-10-CM | POA: Diagnosis not present

## 2020-03-22 DIAGNOSIS — Z992 Dependence on renal dialysis: Secondary | ICD-10-CM | POA: Diagnosis not present

## 2020-03-22 DIAGNOSIS — N186 End stage renal disease: Secondary | ICD-10-CM | POA: Diagnosis not present

## 2020-03-22 DIAGNOSIS — T8249XA Other complication of vascular dialysis catheter, initial encounter: Secondary | ICD-10-CM | POA: Diagnosis not present

## 2020-03-22 MED ORDER — DEXTROSE 5 % IV SOLN
3.0000 g | INTRAVENOUS | Status: AC
  Administered 2020-03-23: 3 g via INTRAVENOUS
  Filled 2020-03-22: qty 3

## 2020-03-22 NOTE — Progress Notes (Signed)
Patient denies shortness of breath, fever, cough or chest pain.  PCP - Mina Marble, DO Cardiologist - n/a Internal Med - [name], NP Neuro - Ward Givens, NP  Chest x-ray - 08/08/19 (2V) EKG - 08/09/19 Stress Test - n/a ECHO - 08/05/17 Cardiac Cath - n/a  Fasting Blood Sugar - unknown per patient Checks Blood Sugar _1_ times a day     . THE MORNING OF SURGERY, do not take Humalog Insulin unless.your CBG is greater than 220 mg/dL, then you may take  of your sliding scale (correction) dose of insulin.  . If your blood sugar is less than 70 mg/dL, you will need to treat for low blood sugar: o Treat a low blood sugar (less than 70 mg/dL) with  cup of clear juice (cranberry or apple), 4 glucose tablets, OR glucose gel. o Recheck blood sugar in 15 minutes after treatment (to make sure it is greater than 70 mg/dL). If your blood sugar is not greater than 70 mg/dL on recheck, call [phone] for further instructions.  Anesthesia review: Yes  STOP now taking any Aspirin (unless otherwise instructed by your surgeon), Aleve, Naproxen, Ibuprofen, Motrin, Advil, Goody's, BC's, all herbal medications, fish oil, and all vitamins.   Coronavirus Screening Covid test on 03/21/20 was negative.  Patient verbalized understanding of instructions that were given via phone.

## 2020-03-23 ENCOUNTER — Encounter (HOSPITAL_COMMUNITY): Payer: Self-pay | Admitting: Vascular Surgery

## 2020-03-23 ENCOUNTER — Ambulatory Visit (HOSPITAL_COMMUNITY): Payer: BC Managed Care – PPO | Admitting: Physician Assistant

## 2020-03-23 ENCOUNTER — Ambulatory Visit (HOSPITAL_COMMUNITY)
Admission: RE | Admit: 2020-03-23 | Discharge: 2020-03-23 | Disposition: A | Discharge status: Home / Self Care | Payer: BC Managed Care – PPO | Attending: Vascular Surgery | Admitting: Vascular Surgery

## 2020-03-23 ENCOUNTER — Other Ambulatory Visit: Payer: Self-pay

## 2020-03-23 ENCOUNTER — Encounter (HOSPITAL_COMMUNITY)
Admission: RE | Disposition: A | Discharge status: Home / Self Care | Payer: Self-pay | Source: Home / Self Care | Attending: Vascular Surgery

## 2020-03-23 DIAGNOSIS — Z7982 Long term (current) use of aspirin: Secondary | ICD-10-CM | POA: Insufficient documentation

## 2020-03-23 DIAGNOSIS — E1122 Type 2 diabetes mellitus with diabetic chronic kidney disease: Secondary | ICD-10-CM | POA: Insufficient documentation

## 2020-03-23 DIAGNOSIS — E7800 Pure hypercholesterolemia, unspecified: Secondary | ICD-10-CM | POA: Diagnosis not present

## 2020-03-23 DIAGNOSIS — Z6841 Body Mass Index (BMI) 40.0 and over, adult: Secondary | ICD-10-CM | POA: Insufficient documentation

## 2020-03-23 DIAGNOSIS — Z992 Dependence on renal dialysis: Secondary | ICD-10-CM | POA: Diagnosis not present

## 2020-03-23 DIAGNOSIS — Z79899 Other long term (current) drug therapy: Secondary | ICD-10-CM | POA: Insufficient documentation

## 2020-03-23 DIAGNOSIS — Z833 Family history of diabetes mellitus: Secondary | ICD-10-CM | POA: Diagnosis not present

## 2020-03-23 DIAGNOSIS — Z452 Encounter for adjustment and management of vascular access device: Secondary | ICD-10-CM | POA: Diagnosis not present

## 2020-03-23 DIAGNOSIS — Z8249 Family history of ischemic heart disease and other diseases of the circulatory system: Secondary | ICD-10-CM | POA: Diagnosis not present

## 2020-03-23 DIAGNOSIS — I120 Hypertensive chronic kidney disease with stage 5 chronic kidney disease or end stage renal disease: Secondary | ICD-10-CM | POA: Insufficient documentation

## 2020-03-23 DIAGNOSIS — Z794 Long term (current) use of insulin: Secondary | ICD-10-CM | POA: Diagnosis not present

## 2020-03-23 DIAGNOSIS — N186 End stage renal disease: Secondary | ICD-10-CM | POA: Insufficient documentation

## 2020-03-23 DIAGNOSIS — T82898A Other specified complication of vascular prosthetic devices, implants and grafts, initial encounter: Secondary | ICD-10-CM | POA: Diagnosis not present

## 2020-03-23 DIAGNOSIS — N185 Chronic kidney disease, stage 5: Secondary | ICD-10-CM | POA: Diagnosis not present

## 2020-03-23 HISTORY — PX: REVISON OF ARTERIOVENOUS FISTULA: SHX6074

## 2020-03-23 HISTORY — DX: Dyspnea, unspecified: R06.00

## 2020-03-23 LAB — POCT I-STAT, CHEM 8
BUN: 42 mg/dL — ABNORMAL HIGH (ref 6–20)
Calcium, Ion: 1.05 mmol/L — ABNORMAL LOW (ref 1.15–1.40)
Chloride: 96 mmol/L — ABNORMAL LOW (ref 98–111)
Creatinine, Ser: 7.6 mg/dL — ABNORMAL HIGH (ref 0.61–1.24)
Glucose, Bld: 300 mg/dL — ABNORMAL HIGH (ref 70–99)
HCT: 36 % — ABNORMAL LOW (ref 39.0–52.0)
Hemoglobin: 12.2 g/dL — ABNORMAL LOW (ref 13.0–17.0)
Potassium: 4.2 mmol/L (ref 3.5–5.1)
Sodium: 137 mmol/L (ref 135–145)
TCO2: 27 mmol/L (ref 22–32)

## 2020-03-23 LAB — GLUCOSE, CAPILLARY
Glucose-Capillary: 278 mg/dL — ABNORMAL HIGH (ref 70–99)
Glucose-Capillary: 242 mg/dL — ABNORMAL HIGH (ref 70–99)
Glucose-Capillary: 206 mg/dL — ABNORMAL HIGH (ref 70–99)

## 2020-03-23 SURGERY — REVISON OF ARTERIOVENOUS FISTULA
Anesthesia: Monitor Anesthesia Care | Site: Arm Upper | Laterality: Left

## 2020-03-23 MED ORDER — LIDOCAINE-EPINEPHRINE 1 %-1:100000 IJ SOLN
INTRAMUSCULAR | Status: AC
  Filled 2020-03-23: qty 1

## 2020-03-23 MED ORDER — ACETAMINOPHEN 500 MG PO TABS: 1000.0000 mg | ORAL_TABLET | Freq: Once | ORAL | Status: DC | PRN

## 2020-03-23 MED ORDER — SODIUM CHLORIDE 0.9 % IV SOLN
INTRAVENOUS | Status: DC | PRN
  Administered 2020-03-23: 500 mL

## 2020-03-23 MED ORDER — SODIUM CHLORIDE 0.9 % IV SOLN: INTRAVENOUS | Status: DC

## 2020-03-23 MED ORDER — PROPOFOL 10 MG/ML IV BOLUS
INTRAVENOUS | Status: AC
  Filled 2020-03-23: qty 20

## 2020-03-23 MED ORDER — MIDAZOLAM HCL 2 MG/2ML IJ SOLN
INTRAMUSCULAR | Status: AC
  Filled 2020-03-23: qty 2

## 2020-03-23 MED ORDER — LIDOCAINE HCL 1 % IJ SOLN
INTRAMUSCULAR | Status: AC
  Filled 2020-03-23: qty 40

## 2020-03-23 MED ORDER — CARVEDILOL 3.125 MG PO TABS
ORAL_TABLET | ORAL | Status: AC
  Administered 2020-03-23: 6.25 mg
  Filled 2020-03-23: qty 2

## 2020-03-23 MED ORDER — LIDOCAINE HCL 1 % IJ SOLN
INTRAMUSCULAR | Status: DC | PRN
  Administered 2020-03-23: 15 mL via INTRADERMAL

## 2020-03-23 MED ORDER — CARVEDILOL 3.125 MG PO TABS: 6.2500 mg | ORAL_TABLET | Freq: Once | ORAL | Status: AC

## 2020-03-23 MED ORDER — INSULIN ASPART 100 UNIT/ML ~~LOC~~ SOLN
SUBCUTANEOUS | Status: AC
  Administered 2020-03-23: 10 [IU] via SUBCUTANEOUS
  Filled 2020-03-23: qty 1

## 2020-03-23 MED ORDER — FENTANYL CITRATE (PF) 100 MCG/2ML IJ SOLN: 25.0000 ug | INTRAMUSCULAR | Status: DC | PRN

## 2020-03-23 MED ORDER — ACETAMINOPHEN 10 MG/ML IV SOLN: 1000.0000 mg | Freq: Once | INTRAVENOUS | Status: DC | PRN

## 2020-03-23 MED ORDER — CHLORHEXIDINE GLUCONATE 4 % EX LIQD: 60.0000 mL | Freq: Once | CUTANEOUS | Status: DC

## 2020-03-23 MED ORDER — OXYCODONE HCL 5 MG PO TABS: 5.0000 mg | ORAL_TABLET | Freq: Once | ORAL | Status: DC | PRN

## 2020-03-23 MED ORDER — OXYCODONE-ACETAMINOPHEN 10-325 MG PO TABS: 1.0000 | ORAL_TABLET | ORAL | 0 refills | Status: DC | PRN

## 2020-03-23 MED ORDER — FENTANYL CITRATE (PF) 100 MCG/2ML IJ SOLN
INTRAMUSCULAR | Status: DC | PRN
Start: 2020-03-23 — End: 2020-03-23
  Administered 2020-03-23: 50 ug via INTRAVENOUS

## 2020-03-23 MED ORDER — SODIUM CHLORIDE 0.9 % IV SOLN
INTRAVENOUS | Status: AC
  Filled 2020-03-23: qty 1.2

## 2020-03-23 MED ORDER — 0.9 % SODIUM CHLORIDE (POUR BTL) OPTIME
TOPICAL | Status: DC | PRN
  Administered 2020-03-23: 1000 mL

## 2020-03-23 MED ORDER — ORAL CARE MOUTH RINSE: 15.0000 mL | Freq: Once | OROMUCOSAL | Status: AC

## 2020-03-23 MED ORDER — FENTANYL CITRATE (PF) 250 MCG/5ML IJ SOLN
INTRAMUSCULAR | Status: AC
  Filled 2020-03-23: qty 5

## 2020-03-23 MED ORDER — CHLORHEXIDINE GLUCONATE 0.12 % MT SOLN
OROMUCOSAL | Status: AC
  Administered 2020-03-23: 15 mL via OROMUCOSAL
  Filled 2020-03-23: qty 15

## 2020-03-23 MED ORDER — CHLORHEXIDINE GLUCONATE 0.12 % MT SOLN: 15.0000 mL | Freq: Once | OROMUCOSAL | Status: AC

## 2020-03-23 MED ORDER — ACETAMINOPHEN 160 MG/5ML PO SOLN: 1000.0000 mg | Freq: Once | ORAL | Status: DC | PRN

## 2020-03-23 MED ORDER — MIDAZOLAM HCL 5 MG/5ML IJ SOLN
INTRAMUSCULAR | Status: DC | PRN
  Administered 2020-03-23: 2 mg via INTRAVENOUS

## 2020-03-23 MED ORDER — PROPOFOL 500 MG/50ML IV EMUL
INTRAVENOUS | Status: DC | PRN
  Administered 2020-03-23: 100 ug/kg/min via INTRAVENOUS

## 2020-03-23 MED ORDER — INSULIN ASPART 100 UNIT/ML ~~LOC~~ SOLN: 10.0000 [IU] | Freq: Once | SUBCUTANEOUS | Status: AC

## 2020-03-23 MED ORDER — OXYCODONE HCL 5 MG/5ML PO SOLN: 5.0000 mg | Freq: Once | ORAL | Status: DC | PRN

## 2020-03-23 SURGICAL SUPPLY — 39 items
ARMBAND PINK RESTRICT EXTREMIT (MISCELLANEOUS) ×2 IMPLANT
BOOT SUTURE AID YELLOW STND (SUTURE) ×2 IMPLANT
CANISTER SUCT 3000ML PPV (MISCELLANEOUS) ×2 IMPLANT
CLIP VESOCCLUDE MED 6/CT (CLIP) ×2 IMPLANT
CLIP VESOCCLUDE SM WIDE 6/CT (CLIP) ×2 IMPLANT
COVER PROBE W GEL 5X96 (DRAPES) IMPLANT
COVER WAND RF STERILE (DRAPES) ×2 IMPLANT
DECANTER SPIKE VIAL GLASS SM (MISCELLANEOUS) ×4 IMPLANT
DERMABOND ADVANCED (GAUZE/BANDAGES/DRESSINGS) ×1
DERMABOND ADVANCED .7 DNX12 (GAUZE/BANDAGES/DRESSINGS) ×1 IMPLANT
ELECT REM PT RETURN 9FT ADLT (ELECTROSURGICAL) ×2
ELECTRODE REM PT RTRN 9FT ADLT (ELECTROSURGICAL) ×1 IMPLANT
GLOVE BIO SURGEON STRL SZ7.5 (GLOVE) ×2 IMPLANT
GLOVE BIOGEL PI IND STRL 6.5 (GLOVE) ×1 IMPLANT
GLOVE BIOGEL PI IND STRL 8 (GLOVE) ×1 IMPLANT
GLOVE BIOGEL PI INDICATOR 6.5 (GLOVE) ×1
GLOVE BIOGEL PI INDICATOR 8 (GLOVE) ×1
GLOVE SURG SS PI 6.5 STRL IVOR (GLOVE) ×2 IMPLANT
GOWN STRL NON-REIN LRG LVL3 (GOWN DISPOSABLE) ×2 IMPLANT
GOWN STRL REUS W/ TWL LRG LVL3 (GOWN DISPOSABLE) ×2 IMPLANT
GOWN STRL REUS W/ TWL XL LVL3 (GOWN DISPOSABLE) ×2 IMPLANT
GOWN STRL REUS W/TWL LRG LVL3 (GOWN DISPOSABLE) ×4
GOWN STRL REUS W/TWL XL LVL3 (GOWN DISPOSABLE) ×4
HEMOSTAT SPONGE AVITENE ULTRA (HEMOSTASIS) IMPLANT
KIT BASIN OR (CUSTOM PROCEDURE TRAY) ×2 IMPLANT
KIT TURNOVER KIT B (KITS) ×2 IMPLANT
NS IRRIG 1000ML POUR BTL (IV SOLUTION) ×2 IMPLANT
PACK CV ACCESS (CUSTOM PROCEDURE TRAY) ×2 IMPLANT
PAD ARMBOARD 7.5X6 YLW CONV (MISCELLANEOUS) ×4 IMPLANT
STAPLER VISISTAT 35W (STAPLE) IMPLANT
SUT MNCRL AB 4-0 PS2 18 (SUTURE) ×4 IMPLANT
SUT PROLENE 5 0 C 1 24 (SUTURE) IMPLANT
SUT PROLENE 6 0 BV (SUTURE) IMPLANT
SUT PROLENE 7 0 BV 1 (SUTURE) IMPLANT
SUT VIC AB 3-0 SH 27 (SUTURE) ×6
SUT VIC AB 3-0 SH 27X BRD (SUTURE) ×3 IMPLANT
TOWEL GREEN STERILE (TOWEL DISPOSABLE) ×2 IMPLANT
UNDERPAD 30X36 HEAVY ABSORB (UNDERPADS AND DIAPERS) ×2 IMPLANT
WATER STERILE IRR 1000ML POUR (IV SOLUTION) ×2 IMPLANT

## 2020-03-23 NOTE — H&P (Signed)
History and Physical Interval Note:  03/23/2020 10:30 AM  Jeremy [name]  has presented today for surgery, with the diagnosis of END STAGE RENAL DISEASE.  The various methods of treatment have been discussed with the patient and family. After consideration of risks, benefits and other options for treatment, the patient has consented to  Procedure(s): LEFT ARM ARTERIOVENOUS FISTULA REVISON (Left) as a surgical intervention.  The patient's history has been reviewed, patient examined, no change in status, stable for surgery.  I have reviewed the patient's chart and labs.  Questions were answered to the patient's satisfaction.    Left arm AVF revision with side-branch ligation.  Slow to mature radiocephalic AVF.  [name]    Patient name: Jeremy [name]            MRN: [id number]        DOB: 1965-10-17          Sex: male  REASON FOR VISIT: Follow-up to discuss slow to mature left radiocephalic AV fistula  HPI: [name] is a 54 y.o. male with end-stage renal disease now on hemodialysis Monday Wednesday Friday, diabetes, hypertension that presents to discuss slow to mature left arm AV fistula.  He previously underwent a left radiocephalic AV fistula for stage IV CKD on 11/11/2019 by myself.  He since progressed to ESRD and has a right IJ tunneled catheter placed by Dr. Augustin Coupe.  Now on dialysis Monday Wednesday Friday.  He states he really does not like getting stuck with needles.  States he is fearful of needles now.  States dialysis has not made any attempt to stick his fistula up to this point.  He is right-handed.  Currently on disability not working.  He complains of numbness in the left hand in all 5 digits that has been intermittent and at times severe waking him up from sleep.  No tissue loss or weakness in left hand.  Feels that he can live with it.        Past Medical History:  Diagnosis Date  . Abscess   . AKI (acute kidney injury) ([place]) 08/05/2017  . Bell's palsy   . CKD  (chronic kidney disease)    Dr. McDiarmind   . Headache   . High cholesterol   . Hypertension   . Left shoulder pain 10/26/2013   S/p injection on 10/26/13   . Renal insufficiency 02/14/2014  . Seizures ([place]) 08/04/2017   "related to high blood sugars" (08/05/2017)  . Type II diabetes mellitus ([place])          Past Surgical History:  Procedure Laterality Date  . AV FISTULA PLACEMENT Left 11/11/2019   Procedure: LEFT FOREARM RADIOCEPHALIC ARTERIOVENOUS (AV) FISTULA CREATION;  Surgeon: [name], MD;  Location: [facility];  Service: Vascular;  Laterality: Left;  . CHOLECYSTECTOMY    . Eyelid surgery Right   . I & D EXTREMITY  11/11/2011   Procedure: IRRIGATION AND DEBRIDEMENT EXTREMITY;  Surgeon: Merrie Roof, MD;  Location: [facility];  Service: General;  Laterality: Right;  I&D Right Thigh Abscess  . IR FLUORO GUIDE CV LINE RIGHT  08/12/2019  . IR US GUIDE VASC ACCESS RIGHT  08/12/2019         Family History  Problem Relation Age of Onset  . Diabetes Mother   . Heart disease Mother 33  . Diabetes Sister   . Diabetes Brother   . Diabetes Brother   . Cancer Maternal Uncle  type unknown  . Stroke Neg Hx     SOCIAL HISTORY: Social History       Tobacco Use  . Smoking status: Never Smoker  . Smokeless tobacco: Never Used  Substance Use Topics  . Alcohol use: Not Currently         Allergies  Allergen Reactions  . Amlodipine Other (See Comments)    Dizziness reported          Current Outpatient Medications  Medication Sig Dispense Refill  . aspirin EC 81 MG tablet Take 1 tablet (81 mg total) by mouth daily. 90 tablet 3  . atorvastatin (LIPITOR) 40 MG tablet Take 1 tablet (40 mg total) by mouth daily. 90 tablet 0  . Blood Glucose Monitoring Suppl (CONTOUR NEXT MONITOR) w/Device KIT 1 Device by Does not apply route 4 (four) times daily - after meals and at bedtime. 1 kit 0  . carvedilol (COREG) 6.25 MG tablet Take 1 tablet (6.25  mg total) by mouth 2 (two) times daily with a meal. 180 tablet 1  . enalapril (VASOTEC) 5 MG tablet TAKE 1 TABLET BY MOUTH EVERY DAY 90 tablet 0  . glucose blood test strip Use as instructed. Inject into the skin 3 times daily E11.65 200 each 12  . hydrALAZINE (APRESOLINE) 25 MG tablet Take 1 tablet (25 mg total) by mouth 3 (three) times daily. 270 tablet 0  . insulin lispro (HUMALOG KWIKPEN) 100 UNIT/ML KwikPen Inject 0.1-0.15 mLs (10-15 Units total) into the skin 2 (two) times daily before a meal. 15 mL 6  . Insulin Pen Needle (B-D ULTRAFINE III SHORT PEN) 31G X 8 MM MISC 1 application by Does not apply route 3 (three) times daily. E11.65 200 each 11  . Lancet Devices (MICROLET NEXT LANCING DEVICE) MISC 1 Device by Does not apply route 3 (three) times daily. 1 each 11  . levETIRAcetam (KEPPRA XR) 500 MG 24 hr tablet Take 2 tablets (1,000 mg total) by mouth daily. 180 tablet 3  . lidocaine-prilocaine (EMLA) cream SMARTSIG:Sparingly Topical    . Microlet Lancets MISC 1 Device by Does not apply route 3 (three) times daily. E11.65 200 each 11  . Semaglutide,0.25 or 0.5MG/DOS, (OZEMPIC, 0.25 OR 0.5 MG/DOSE,) 2 MG/1.5ML SOPN Inject 0.375 mg into the skin once a week. 5.99 mg plus 5 clicks 3 pen 6  . traZODone (DESYREL) 50 MG tablet Take 1-2 tablets (50-100 mg total) by mouth at bedtime. 60 tablet 2  . VELPHORO 500 MG chewable tablet Chew 500 mg by mouth 3 (three) times daily.    Marland Kitchen VITAMIN D PO Take by mouth.     No current facility-administered medications for this visit.    REVIEW OF SYSTEMS:  _0  denotes positive finding, _1  denotes negative finding Cardiac  Comments:  Chest pain or chest pressure:    Shortness of breath upon exertion:    Short of breath when lying flat:    Irregular heart rhythm:        Vascular    Pain in calf, thigh, or hip brought on by ambulation:    Pain in feet at night that wakes you up from your sleep:     Blood clot in your veins:      Leg swelling:         Pulmonary    Oxygen at home:    Productive cough:     Wheezing:         Neurologic    Sudden weakness in arms or  legs:     Sudden numbness in arms or legs:     Sudden onset of difficulty speaking or slurred speech:    Temporary loss of vision in one eye:     Problems with dizziness:         Gastrointestinal    Blood in stool:     Vomited blood:         Genitourinary    Burning when urinating:     Blood in urine:        Psychiatric    Major depression:         Hematologic    Bleeding problems:    Problems with blood clotting too easily:        Skin    Rashes or ulcers:        Constitutional    Fever or chills:      PHYSICAL EXAM:    Vitals:   03/14/20 0826  BP: 140/79  Pulse: 81  Resp: 18  Temp: (!) 94.8 F (34.9 C)  TempSrc: Temporal  SpO2: 100%  Weight: (!) 315 lb (142.9 kg)  Height: _0  (1.727 m)    GENERAL: The patient is a well-nourished male, in no acute distress. The vital signs are documented above. CARDIAC: There is a regular rate and rhythm.  VASCULAR:  Left radial and ulnar pulses palpable Left brachial pulse palpable Left radiocephalic fistula with good thrill in the distal to mid forearm. No tissue loss in the left hand. PULMONARY: There is good air exchange bilaterally without wheezing or rales. ABDOMEN: Soft and non-tender with normal pitched bowel sounds.  MUSCULOSKELETAL: There are no major deformities or cyanosis. NEUROLOGIC: No focal weakness or paresthesias are detected. SKIN: There are no ulcers or rashes noted. PSYCHIATRIC: The patient has a normal affect.  DATA:   Previous left upper extremity fistula duplex on 02/24/2020 shows excellent flow volumes of 1203 mL/min in the fistula, the fistula is small in the distal forearm measuring 3 mm but 5-5.5 mm in the mid forearm to the [facility] fossa.  Assessment/Plan:  54 year old  male previously had a left radiocephalic AV fistula on 2/77/4128 for stage IV CKD that has been slow to mature.  He has now progressed to ESRD and has a right IJ tunneled catheter placed by Dr. Augustin Coupe.  On exam today he does have a good thrill in the distal to mid forearm.  In further discussion, he is experiencing some numbness in the left hand in digits 1 through 5 that could be concerning for steal.  That being said he has easily palpable radial and ulnar pulses at the wrist and a radiocephalic fistula would be the lowest risk for steal and hence we discussed that converting his fistula to more proximal in the upper arm like a brachiocephalic could certainly worsen his symptoms.  I looked at his fistula with ultrasound myself today and he does have at least four side branches in the distal to proximal forearm that could be ligated to further facilitate maturation.  I discussed with him that there would be some risk that this does not work and then we can reevaluate converting him to a more proximal upper arm fistula.  We also discussed that he is fearful of needles and discussed the only other return it would be peritoneal dialysis and encouraged that he talk to Dr. Hollie Salk about this in more detail.  After discussion of all options, will schedule for left arm AVF revision with sidebranch ligation and try  to get the radiocepahlic AVF more mature.  Risks benefits discussed in detail.  Schedule for non-dialysis day.     [name], MD [facility] Office: [phone]

## 2020-03-23 NOTE — Anesthesia Procedure Notes (Signed)
Procedure Name: LMA Insertion Date/Time: 03/23/2020 11:48 AM Performed by: Lavell Luster, CRNA Pre-anesthesia Checklist: Patient identified, Emergency Drugs available, Suction available, Patient being monitored and Timeout performed Patient Re-evaluated:Patient Re-evaluated prior to induction Oxygen Delivery Method: Circle system utilized Preoxygenation: Pre-oxygenation with 100% oxygen Induction Type: IV induction Ventilation: Mask ventilation with difficulty, Oral airway inserted - appropriate to patient size, Two handed mask ventilation required and Nasal airway inserted- appropriate to patient size LMA: LMA inserted LMA Size: 5.0 Number of attempts: 1 Placement Confirmation: breath sounds checked- equal and bilateral and positive ETCO2 Tube secured with: Tape Dental Injury: Dental damage  Difficulty Due To: Difficulty was anticipated Future Recommendations: Recommend- induction with short-acting agent, and alternative techniques readily available Comments: Loose tooth noted in pre-op and dental advisory given by Dr Ermalene Postin, attempted MAC converted to LMA insertion, loose tooth came out during airway manipulation.  Surgeon and anesthesiologist aware and at bedside.  Henderson Cloud, CRNA

## 2020-03-23 NOTE — Op Note (Signed)
Date: March 23, 2020  Preoperative diagnosis: Slow to mature left radiocephalic AV fistula that remains difficult to cannulate  Postoperative diagnosis: Same  Procedure: Left radiocephalic AV fistula revision with sidebranch ligation x5 and superficialization  Surgeon: Dr. [name], MD  Assistant: Risa Grill, PA  Indications: Patient is a 54 year old male that previously underwent a left radiocephalic AV fistula on 01/09/3085 for stage IV CKD.  He has since progressed to ESRD and has a right IJ catheter placed and was sent back for evaluation given difficulty cannulating left radiocephalic AV fistula.  On duplex this appeared to be small in the distal forearm with multiple side branches and much larger in the mid to proximal forearm.Marland Kitchen  He presents today for planned revision after risk benefits discussed.  An assistant was needed for exposure and to facilitate the case being done in a timely fashion.  Findings: At least 5 side branches were ligated distal to the antecubital space in the left forearm along the radiocephalic AV fistula.  The vein itself was fairly deep underneath the fascia and subsequently elected to elevate this through the 3 skip incisions given documented difficulty with cannulation in addition to slow to mature.  Good thrill upon completion.  Anesthesia: MAC with conversion to LMA  Details: Patient was taken to the operating room after informed consent was obtained.  After anesthesia was induced the left arm was prepped draped usual sterile fashion.  Timeout was performed to identify patient, procedure and site.  Initially used a sterile ultrasound probe and evaluated the radiocephalic fistula in the left forearm and we identified at least 5 side branches that were marked on the skin.  Ultimately 1% lidocaine without epinephrine was then used and the three planned incisions to get all the sidebranches was anesthetized with lidocaine using a total of 10 mL.   Initially made fairly small longitudinal incisions over three locations where the side branches had been identified on ultrasound.  Initially dissected down with Bovie cautery and Metzenbaum scissors in order to identify the fistula and the subsequent sidebranch.  The first branch was identified distal in the forearm ligated between 3-0 silk ties and divided.  It became obvious that the fistula was fairly deep and under the fascia and I suspect this is part of the reason they are having difficulty cannulating.  I subsequently elected to extend the two incisions in the distal and mid forearm and through these I subsequently fully mobilized the fistula from the fascia that was enclosing it.  In the process identified four other side branches that were ligated between 3-0 silk ties and divided.  Ultimately the fistula was elevated and we closed subcutaneous tissue underneath the fistula with 3-0 Vicryl's in interrupted fashion to elevate it under the skin.  The skin was run closed with 4-0 Monocryl directly over the fistula.  He had a good thrill upon completion.  Complication: None  Condition: Stable  [name], MD [facility] Office: [place]

## 2020-03-23 NOTE — Discharge Instructions (Signed)
° °  [facility] ° °Discharge Instructions ° °AV Fistula or Graft Surgery for Dialysis Access ° °Please refer to the following instructions for your post-procedure care. Your surgeon or physician assistant will discuss any changes with you. ° °Activity ° °You may drive the day following your surgery, if you are comfortable and no longer taking prescription pain medication. Resume full activity as the soreness in your incision resolves. ° °Bathing/Showering ° °You may shower after you go home. Keep your incision dry for 48 hours. Do not soak in a bathtub, hot tub, or swim until the incision heals completely. You may not shower if you have a hemodialysis catheter. ° °Incision Care ° °Clean your incision with mild soap and water after 48 hours. Pat the area dry with a clean towel. You do not need a bandage unless otherwise instructed. Do not apply any ointments or creams to your incision. You may have skin glue on your incision. Do not peel it off. It will come off on its own in about one week. Your arm may swell a bit after surgery. To reduce swelling use pillows to elevate your arm so it is above your heart. Your doctor will tell you if you need to lightly wrap your arm with an ACE bandage. ° °Diet ° °Resume your normal diet. There are not special food restrictions following this procedure. In order to heal from your surgery, it is CRITICAL to get adequate nutrition. Your body requires vitamins, minerals, and protein. Vegetables are the best source of vitamins and minerals. Vegetables also provide the perfect balance of protein. Processed food has little nutritional value, so try to avoid this. ° °Medications ° °Resume taking all of your medications. If your incision is causing pain, you may take over-the counter pain relievers such as acetaminophen (Tylenol). If you were prescribed a stronger pain medication, please be aware these medications can cause nausea and constipation. Prevent  nausea by taking the medication with a snack or meal. Avoid constipation by drinking plenty of fluids and eating foods with high amount of fiber, such as fruits, vegetables, and grains. Do not take Tylenol if you are taking prescription pain medications. ° ° ° ° °Follow up °Your surgeon may want to see you in the office following your access surgery. If so, this will be arranged at the time of your surgery. ° °Please call us immediately for any of the following conditions: ° °Increased pain, redness, drainage (pus) from your incision site °Fever of 101 degrees or higher °Severe or worsening pain at your incision site °Hand pain or numbness. ° °Reduce your risk of vascular disease: ° °Stop smoking. If you would like help, call [facility] at 1-800-QUIT-NOW (1-800-784-8669) or [facility] at 336-586-4000 ° °Manage your cholesterol °Maintain a desired weight °Control your diabetes °Keep your blood pressure down ° °Dialysis ° °It will take several weeks to several months for your new dialysis access to be ready for use. Your surgeon will determine when it is OK to use it. Your nephrologist will continue to direct your dialysis. You can continue to use your Permcath until your new access is ready for use. ° °If you have any questions, please call the office at [phone]. ° °

## 2020-03-23 NOTE — Transfer of Care (Signed)
Immediate Anesthesia Transfer of Care Note  Patient: Jeremy [name]  Procedure(s) Performed: LEFT ARM ARTERIOVENOUS FISTULA REVISON, LIGATION OF SIDE BRANCHES AND ELEVATION (Left Arm Upper)  Patient Location: PACU  Anesthesia Type:General  Level of Consciousness: awake, alert  and oriented  Airway & Oxygen Therapy: Patient connected to face mask oxygen  Post-op Assessment: Post -op Vital signs reviewed and stable  Post vital signs: stable  Last Vitals:  Vitals Value Taken Time  BP    Temp    Pulse 76 03/23/20 1252  Resp 22 03/23/20 1252  SpO2 100 % 03/23/20 1252  Vitals shown include unvalidated device data.  Last Pain:  Vitals:   03/23/20 0950  TempSrc:   PainSc: 0-No pain         Complications: No complications documented.

## 2020-03-23 NOTE — Anesthesia Preprocedure Evaluation (Signed)
Anesthesia Evaluation  Patient identified by MRN, date of birth, ID band Patient awake    Reviewed: Allergy & Precautions, NPO status , Patient's Chart, lab work & pertinent test results, reviewed documented beta blocker date and time   History of Anesthesia Complications Negative for: history of anesthetic complications  Airway Mallampati: III  TM Distance: >3 FB Neck ROM: Full    Dental  (+) Missing, Loose, Dental Advisory Given, Poor Dentition,    Pulmonary shortness of breath, sleep apnea , neg COPD, neg recent URI,  Covid-19 Nucleic Acid Test Results Lab Results      Component                Value               Date                      SARSCOV2NAA              NEGATIVE            03/21/2020                [place]              NEGATIVE            11/10/2019                [place] (A)        08/08/2019                SARSCOV2                 NEGATIVE            08/08/2019              breath sounds clear to auscultation       Cardiovascular hypertension, Pt. on medications and Pt. on home beta blockers (-) angina(-) Past MI and (-) CHF  Rhythm:Regular     Neuro/Psych  Headaches, Seizures -, Well Controlled,   Neuromuscular disease negative psych ROS   GI/Hepatic negative GI ROS, Neg liver ROS,   Endo/Other  diabetes, Poorly Controlled, Insulin DependentMorbid obesity  Renal/GU ESRF and DialysisRenal diseaseHD 9/1 Lab Results      Component                Value               Date                      CREATININE               7.60 (H)            03/23/2020           Lab Results      Component                Value               Date                      K                        4.2                 03/23/2020  Musculoskeletal negative musculoskeletal ROS (+)   Abdominal   Peds  Hematology  (+) Blood dyscrasia, anemia , Lab Results      Component                Value                Date                      WBC                      7.0                 11/09/2019                HGB                      12.2 (L)            03/23/2020                HCT                      36.0 (L)            03/23/2020                MCV                      81                  11/09/2019                PLT                      208                 11/09/2019              Anesthesia Other Findings   Reproductive/Obstetrics                             Anesthesia Physical Anesthesia Plan  ASA: III  Anesthesia Plan: MAC   Post-op Pain Management:    Induction: Intravenous  PONV Risk Score and Plan: 1 and Treatment may vary due to age or medical condition  Airway Management Planned: Nasal Cannula  Additional Equipment: None  Intra-op Plan:   Post-operative Plan:   Informed Consent: I have reviewed the patients History and Physical, chart, labs and discussed the procedure including the risks, benefits and alternatives for the proposed anesthesia with the patient or authorized representative who has indicated his/her understanding and acceptance.       Plan Discussed with: CRNA and Surgeon  Anesthesia Plan Comments:         Anesthesia Quick Evaluation

## 2020-03-24 ENCOUNTER — Encounter (HOSPITAL_COMMUNITY): Payer: Self-pay | Admitting: Vascular Surgery

## 2020-03-24 DIAGNOSIS — T8249XA Other complication of vascular dialysis catheter, initial encounter: Secondary | ICD-10-CM | POA: Diagnosis not present

## 2020-03-24 DIAGNOSIS — N2581 Secondary hyperparathyroidism of renal origin: Secondary | ICD-10-CM | POA: Diagnosis not present

## 2020-03-24 DIAGNOSIS — Z992 Dependence on renal dialysis: Secondary | ICD-10-CM | POA: Diagnosis not present

## 2020-03-24 DIAGNOSIS — N186 End stage renal disease: Secondary | ICD-10-CM | POA: Diagnosis not present

## 2020-03-29 DIAGNOSIS — N2581 Secondary hyperparathyroidism of renal origin: Secondary | ICD-10-CM | POA: Diagnosis not present

## 2020-03-29 DIAGNOSIS — T8249XA Other complication of vascular dialysis catheter, initial encounter: Secondary | ICD-10-CM | POA: Diagnosis not present

## 2020-03-29 DIAGNOSIS — N186 End stage renal disease: Secondary | ICD-10-CM | POA: Diagnosis not present

## 2020-03-29 DIAGNOSIS — Z992 Dependence on renal dialysis: Secondary | ICD-10-CM | POA: Diagnosis not present

## 2020-03-30 NOTE — Anesthesia Postprocedure Evaluation (Signed)
Anesthesia Post Note  Patient: Jeremy [name]  Procedure(s) Performed: LEFT ARM ARTERIOVENOUS FISTULA REVISON, LIGATION OF SIDE BRANCHES AND ELEVATION (Left Arm Upper)     Patient location during evaluation: PACU Anesthesia Type: General Level of consciousness: awake and alert Pain management: pain level controlled Vital Signs Assessment: post-procedure vital signs reviewed and stable Respiratory status: spontaneous breathing, nonlabored ventilation, respiratory function stable and patient connected to nasal cannula oxygen Cardiovascular status: blood pressure returned to baseline and stable Postop Assessment: no apparent nausea or vomiting Anesthetic complications: yes Comments: Patient lost loose tooth during case.    No complications documented.  Last Vitals:  Vitals:   03/23/20 1325 03/23/20 1340  BP: (!) 161/95 (!) 144/99  Pulse: 73 72  Resp: 16 18  Temp:    SpO2: 99% 97%    Last Pain:  Vitals:   03/23/20 1340  TempSrc:   PainSc: 0-No pain                 Kadi Hession

## 2020-03-31 DIAGNOSIS — N2581 Secondary hyperparathyroidism of renal origin: Secondary | ICD-10-CM | POA: Diagnosis not present

## 2020-03-31 DIAGNOSIS — T8249XA Other complication of vascular dialysis catheter, initial encounter: Secondary | ICD-10-CM | POA: Diagnosis not present

## 2020-03-31 DIAGNOSIS — N186 End stage renal disease: Secondary | ICD-10-CM | POA: Diagnosis not present

## 2020-03-31 DIAGNOSIS — Z992 Dependence on renal dialysis: Secondary | ICD-10-CM | POA: Diagnosis not present

## 2020-04-07 DIAGNOSIS — N2581 Secondary hyperparathyroidism of renal origin: Secondary | ICD-10-CM | POA: Diagnosis not present

## 2020-04-07 DIAGNOSIS — Z992 Dependence on renal dialysis: Secondary | ICD-10-CM | POA: Diagnosis not present

## 2020-04-07 DIAGNOSIS — T8249XA Other complication of vascular dialysis catheter, initial encounter: Secondary | ICD-10-CM | POA: Diagnosis not present

## 2020-04-07 DIAGNOSIS — Z23 Encounter for immunization: Secondary | ICD-10-CM | POA: Diagnosis not present

## 2020-04-07 DIAGNOSIS — N186 End stage renal disease: Secondary | ICD-10-CM | POA: Diagnosis not present

## 2020-04-10 DIAGNOSIS — T8249XA Other complication of vascular dialysis catheter, initial encounter: Secondary | ICD-10-CM | POA: Diagnosis not present

## 2020-04-10 DIAGNOSIS — N2581 Secondary hyperparathyroidism of renal origin: Secondary | ICD-10-CM | POA: Diagnosis not present

## 2020-04-10 DIAGNOSIS — N186 End stage renal disease: Secondary | ICD-10-CM | POA: Diagnosis not present

## 2020-04-10 DIAGNOSIS — Z992 Dependence on renal dialysis: Secondary | ICD-10-CM | POA: Diagnosis not present

## 2020-04-11 ENCOUNTER — Ambulatory Visit: Payer: BC Managed Care – PPO | Admitting: Nurse Practitioner

## 2020-04-12 ENCOUNTER — Other Ambulatory Visit: Payer: Self-pay

## 2020-04-12 DIAGNOSIS — N186 End stage renal disease: Secondary | ICD-10-CM | POA: Diagnosis not present

## 2020-04-12 DIAGNOSIS — N2581 Secondary hyperparathyroidism of renal origin: Secondary | ICD-10-CM | POA: Diagnosis not present

## 2020-04-12 DIAGNOSIS — T8249XA Other complication of vascular dialysis catheter, initial encounter: Secondary | ICD-10-CM | POA: Diagnosis not present

## 2020-04-12 DIAGNOSIS — Z992 Dependence on renal dialysis: Secondary | ICD-10-CM

## 2020-04-14 DIAGNOSIS — Z992 Dependence on renal dialysis: Secondary | ICD-10-CM | POA: Diagnosis not present

## 2020-04-14 DIAGNOSIS — N2581 Secondary hyperparathyroidism of renal origin: Secondary | ICD-10-CM | POA: Diagnosis not present

## 2020-04-14 DIAGNOSIS — N186 End stage renal disease: Secondary | ICD-10-CM | POA: Diagnosis not present

## 2020-04-14 DIAGNOSIS — T8249XA Other complication of vascular dialysis catheter, initial encounter: Secondary | ICD-10-CM | POA: Diagnosis not present

## 2020-04-17 DIAGNOSIS — T8249XA Other complication of vascular dialysis catheter, initial encounter: Secondary | ICD-10-CM | POA: Diagnosis not present

## 2020-04-17 DIAGNOSIS — Z992 Dependence on renal dialysis: Secondary | ICD-10-CM | POA: Diagnosis not present

## 2020-04-17 DIAGNOSIS — N186 End stage renal disease: Secondary | ICD-10-CM | POA: Diagnosis not present

## 2020-04-17 DIAGNOSIS — N2581 Secondary hyperparathyroidism of renal origin: Secondary | ICD-10-CM | POA: Diagnosis not present

## 2020-04-19 DIAGNOSIS — T8249XA Other complication of vascular dialysis catheter, initial encounter: Secondary | ICD-10-CM | POA: Diagnosis not present

## 2020-04-19 DIAGNOSIS — Z992 Dependence on renal dialysis: Secondary | ICD-10-CM | POA: Diagnosis not present

## 2020-04-19 DIAGNOSIS — N186 End stage renal disease: Secondary | ICD-10-CM | POA: Diagnosis not present

## 2020-04-19 DIAGNOSIS — N2581 Secondary hyperparathyroidism of renal origin: Secondary | ICD-10-CM | POA: Diagnosis not present

## 2020-04-20 DIAGNOSIS — Z992 Dependence on renal dialysis: Secondary | ICD-10-CM | POA: Diagnosis not present

## 2020-04-20 DIAGNOSIS — N186 End stage renal disease: Secondary | ICD-10-CM | POA: Diagnosis not present

## 2020-04-20 DIAGNOSIS — I129 Hypertensive chronic kidney disease with stage 1 through stage 4 chronic kidney disease, or unspecified chronic kidney disease: Secondary | ICD-10-CM | POA: Diagnosis not present

## 2020-04-21 DIAGNOSIS — N2581 Secondary hyperparathyroidism of renal origin: Secondary | ICD-10-CM | POA: Diagnosis not present

## 2020-04-21 DIAGNOSIS — Z23 Encounter for immunization: Secondary | ICD-10-CM | POA: Diagnosis not present

## 2020-04-21 DIAGNOSIS — Z992 Dependence on renal dialysis: Secondary | ICD-10-CM | POA: Diagnosis not present

## 2020-04-21 DIAGNOSIS — N186 End stage renal disease: Secondary | ICD-10-CM | POA: Diagnosis not present

## 2020-04-21 DIAGNOSIS — T8249XA Other complication of vascular dialysis catheter, initial encounter: Secondary | ICD-10-CM | POA: Diagnosis not present

## 2020-04-24 DIAGNOSIS — N2581 Secondary hyperparathyroidism of renal origin: Secondary | ICD-10-CM | POA: Diagnosis not present

## 2020-04-24 DIAGNOSIS — T8249XA Other complication of vascular dialysis catheter, initial encounter: Secondary | ICD-10-CM | POA: Diagnosis not present

## 2020-04-24 DIAGNOSIS — N186 End stage renal disease: Secondary | ICD-10-CM | POA: Diagnosis not present

## 2020-04-24 DIAGNOSIS — Z992 Dependence on renal dialysis: Secondary | ICD-10-CM | POA: Diagnosis not present

## 2020-04-25 ENCOUNTER — Other Ambulatory Visit: Payer: Self-pay

## 2020-04-25 ENCOUNTER — Encounter: Payer: Self-pay | Admitting: Vascular Surgery

## 2020-04-25 ENCOUNTER — Ambulatory Visit (HOSPITAL_COMMUNITY)
Admission: RE | Admit: 2020-04-25 | Discharge: 2020-04-25 | Disposition: A | Discharge status: Home / Self Care | Payer: BC Managed Care – PPO | Source: Ambulatory Visit | Attending: Vascular Surgery | Admitting: Vascular Surgery

## 2020-04-25 ENCOUNTER — Ambulatory Visit (INDEPENDENT_AMBULATORY_CARE_PROVIDER_SITE_OTHER): Payer: Self-pay | Admitting: Vascular Surgery

## 2020-04-25 VITALS — BP 160/91 | HR 77 | Temp 97.2°F | Resp 16 | Ht 68.0 in | Wt 316.0 lb

## 2020-04-25 DIAGNOSIS — N186 End stage renal disease: Secondary | ICD-10-CM | POA: Insufficient documentation

## 2020-04-25 DIAGNOSIS — Z992 Dependence on renal dialysis: Secondary | ICD-10-CM | POA: Diagnosis not present

## 2020-04-25 NOTE — Progress Notes (Addendum)
Patient name: Jeremy [name] MRN: [id number] DOB: [birth year] Sex: male  REASON FOR VISIT: Post-op check after left radiocephalic AVF side-branch ligation and superficialization  HPI: Jeremy [name] is a 54 y.o. male with end-stage renal disease now on hemodialysis Monday Wednesday Friday, diabetes, hypertension that presents for postop check after recent revision of a left radiocephalic fistula that was slow to mature.  He previously underwent a left radiocephalic AV fistula on 6/76/1950 by myself.  He since progressed to ESRD and has a right IJ tunneled catheter placed by Dr. Augustin Coupe given the left AV fistula remained small.  Ultimately we took him on 03/23/20 for sidebranch ligation x5 and superficialization.  States all of the incisions in left forearm have healed.  He is having no additional numbness in the hand that he was initially complaining about.  Past Medical History:  Diagnosis Date  . Abscess   . AKI (acute kidney injury) ([place]) 08/05/2017  . Bell's palsy    right eye abn since dx bell's palsy  . CKD (chronic kidney disease)    Dr. McDiarmind   . Dyspnea    occasional with exertion  . Headache   . High cholesterol   . Hypertension   . Left shoulder pain 10/26/2013   S/p injection on 10/26/13   . Renal insufficiency 02/14/2014  . Seizures ([place]) 08/04/2017   "related to high blood sugars" (08/05/2017)  . Type II diabetes mellitus ([place])     Past Surgical History:  Procedure Laterality Date  . AV FISTULA PLACEMENT Left 11/11/2019   Procedure: LEFT FOREARM RADIOCEPHALIC ARTERIOVENOUS (AV) FISTULA CREATION;  Surgeon: [name], MD;  Location: [facility];  Service: Vascular;  Laterality: Left;  . Eyelid surgery Right   . I & D EXTREMITY  11/11/2011   Procedure: IRRIGATION AND DEBRIDEMENT EXTREMITY;  Surgeon: Merrie Roof, MD;  Location: [facility];  Service: General;  Laterality: Right;  I&D Right Thigh Abscess  . IR FLUORO GUIDE CV LINE RIGHT  08/12/2019  . IR US GUIDE VASC ACCESS  RIGHT  08/12/2019  . REVISON OF ARTERIOVENOUS FISTULA Left 03/23/2020   Procedure: LEFT ARM ARTERIOVENOUS FISTULA REVISON, LIGATION OF SIDE BRANCHES AND ELEVATION;  Surgeon: [name], MD;  Location: [facility];  Service: Vascular;  Laterality: Left;    Family History  Problem Relation Age of Onset  . Diabetes Mother   . Heart disease Mother 17  . Diabetes Sister   . Diabetes Brother   . Diabetes Brother   . Cancer Maternal Uncle        type unknown  . Stroke Neg Hx     SOCIAL HISTORY: Social History   Tobacco Use  . Smoking status: Never Smoker  . Smokeless tobacco: Never Used  Substance Use Topics  . Alcohol use: Not Currently    Allergies  Allergen Reactions  . Amlodipine Other (See Comments)    Dizziness    Current Outpatient Medications  Medication Sig Dispense Refill  . amLODipine (NORVASC) 10 MG tablet Take 10 mg by mouth daily.    Marland Kitchen aspirin EC 81 MG tablet Take 1 tablet (81 mg total) by mouth daily. 90 tablet 3  . atorvastatin (LIPITOR) 40 MG tablet Take 1 tablet (40 mg total) by mouth daily. 90 tablet 0  . Blood Glucose Monitoring Suppl (CONTOUR NEXT MONITOR) w/Device KIT 1 Device by Does not apply route 4 (four) times daily - after meals and at bedtime. 1 kit 0  . carvedilol (COREG) 6.25 MG  tablet Take 1 tablet (6.25 mg total) by mouth 2 (two) times daily with a meal. 180 tablet 1  . enalapril (VASOTEC) 5 MG tablet TAKE 1 TABLET BY MOUTH EVERY DAY (Patient taking differently: Take 5 mg by mouth daily. ) 90 tablet 0  . glucose blood test strip Use as instructed. Inject into the skin 3 times daily E11.65 200 each 12  . insulin lispro (HUMALOG KWIKPEN) 100 UNIT/ML KwikPen Inject 0.1-0.15 mLs (10-15 Units total) into the skin 2 (two) times daily before a meal. (Patient taking differently: Inject 22 Units into the skin in the morning and at bedtime. ) 15 mL 6  . Insulin Pen Needle (B-D ULTRAFINE III SHORT PEN) 31G X 8 MM MISC 1 application by Does not apply route 3  (three) times daily. E11.65 200 each 11  . Lancet Devices (MICROLET NEXT LANCING DEVICE) MISC 1 Device by Does not apply route 3 (three) times daily. 1 each 11  . levETIRAcetam (KEPPRA XR) 500 MG 24 hr tablet Take 2 tablets (1,000 mg total) by mouth daily. (Patient taking differently: Take 500 mg by mouth in the morning and at bedtime. ) 180 tablet 3  . lidocaine-prilocaine (EMLA) cream SMARTSIG:Sparingly Topical    . Microlet Lancets MISC 1 Device by Does not apply route 3 (three) times daily. E11.65 200 each 11  . oxyCODONE-acetaminophen (PERCOCET) 10-325 MG tablet Take 1 tablet by mouth every 4 (four) hours as needed for pain. 16 tablet 0  . Semaglutide,0.25 or 0.5MG /DOS, (OZEMPIC, 0.25 OR 0.5 MG/DOSE,) 2 MG/1.5ML SOPN Inject 0.375 mg into the skin once a week. 3.81 mg plus 5 clicks (Patient taking differently: Inject 0.375 mg into the skin every Monday. 0.17 mg plus 5 clicks) 3 pen 6  . VELPHORO 500 MG chewable tablet Chew 500 mg by mouth 3 (three) times daily.    . hydrALAZINE (APRESOLINE) 25 MG tablet Take 1 tablet (25 mg total) by mouth 3 (three) times daily. (Patient not taking: Reported on 03/17/2020) 270 tablet 0  . traZODone (DESYREL) 50 MG tablet Take 1-2 tablets (50-100 mg total) by mouth at bedtime. (Patient taking differently: Take 50-100 mg by mouth at bedtime as needed for sleep. ) 60 tablet 2   No current facility-administered medications for this visit.    REVIEW OF SYSTEMS:  [X]  denotes positive finding, [ ]  denotes negative finding Cardiac  Comments:  Chest pain or chest pressure:    Shortness of breath upon exertion:    Short of breath when lying flat:    Irregular heart rhythm:        Vascular    Pain in calf, thigh, or hip brought on by ambulation:    Pain in feet at night that wakes you up from your sleep:     Blood clot in your veins:    Leg swelling:         Pulmonary    Oxygen at home:    Productive cough:     Wheezing:         Neurologic    Sudden  weakness in arms or legs:     Sudden numbness in arms or legs:     Sudden onset of difficulty speaking or slurred speech:    Temporary loss of vision in one eye:     Problems with dizziness:         Gastrointestinal    Blood in stool:     Vomited blood:         Genitourinary  Burning when urinating:     Blood in urine:        Psychiatric    Major depression:         Hematologic    Bleeding problems:    Problems with blood clotting too easily:        Skin    Rashes or ulcers:        Constitutional    Fever or chills:      PHYSICAL EXAM: Vitals:   04/25/20 0949  BP: (!) 160/91  Pulse: 77  Resp: 16  Temp: (!) 97.2 F (36.2 C)  TempSrc: Temporal  SpO2: 96%  Weight: (!) 316 lb (143.3 kg)  Height: 5' 8" (1.727 m)    GENERAL: The patient is a well-nourished male, in no acute distress. The vital signs are documented above. CARDIAC: There is a regular rate and rhythm.  VASCULAR:  Left radial pulse palpable Left radiocephalic AV fistula with good thrill throughout the forearm   DATA:   Left arm fistula duplex shows that the cephalic vein now measures 5.6 to 5.8 mm in the forearm which is significantly improved from 3 mm prior to sidebranch ligation.  The fistula is superficial and only 4 mm deep.  Assessment/Plan:  54 year old male previously had a left radiocephalic AV fistula on 3/38/2505 for stage IV CKD that has been slow to mature.  He progressed to ESRD and had a right IJ tunneled catheter placed by Dr. Augustin Coupe.  After recent revision on 03/23/2020 with sidebranch ligation x5 and superficialization the fistula looks like it is maturing based on duplex today.  It now measures 5.6 to 5.8 mm in the forearm which is much improved.  He has a good thrill on exam.  Discussed after a couple more weeks I would be fine with dialysis attempting to use it and hopefully get his catheter removed.  He knows to call with questions or concerns or if the fistula has ongoing issues.  I  also instructed that he keep exercising his left hand daily with a squeeze ball.  [name], MD [facility] Office: [phone]

## 2020-04-26 DIAGNOSIS — N2581 Secondary hyperparathyroidism of renal origin: Secondary | ICD-10-CM | POA: Diagnosis not present

## 2020-04-26 DIAGNOSIS — N186 End stage renal disease: Secondary | ICD-10-CM | POA: Diagnosis not present

## 2020-04-26 DIAGNOSIS — T8249XA Other complication of vascular dialysis catheter, initial encounter: Secondary | ICD-10-CM | POA: Diagnosis not present

## 2020-04-26 DIAGNOSIS — Z992 Dependence on renal dialysis: Secondary | ICD-10-CM | POA: Diagnosis not present

## 2020-04-28 DIAGNOSIS — N186 End stage renal disease: Secondary | ICD-10-CM | POA: Diagnosis not present

## 2020-04-28 DIAGNOSIS — T8249XA Other complication of vascular dialysis catheter, initial encounter: Secondary | ICD-10-CM | POA: Diagnosis not present

## 2020-04-28 DIAGNOSIS — N2581 Secondary hyperparathyroidism of renal origin: Secondary | ICD-10-CM | POA: Diagnosis not present

## 2020-04-28 DIAGNOSIS — Z992 Dependence on renal dialysis: Secondary | ICD-10-CM | POA: Diagnosis not present

## 2020-05-01 DIAGNOSIS — T8249XA Other complication of vascular dialysis catheter, initial encounter: Secondary | ICD-10-CM | POA: Diagnosis not present

## 2020-05-01 DIAGNOSIS — N186 End stage renal disease: Secondary | ICD-10-CM | POA: Diagnosis not present

## 2020-05-01 DIAGNOSIS — Z992 Dependence on renal dialysis: Secondary | ICD-10-CM | POA: Diagnosis not present

## 2020-05-01 DIAGNOSIS — N2581 Secondary hyperparathyroidism of renal origin: Secondary | ICD-10-CM | POA: Diagnosis not present

## 2020-05-03 DIAGNOSIS — N2581 Secondary hyperparathyroidism of renal origin: Secondary | ICD-10-CM | POA: Diagnosis not present

## 2020-05-03 DIAGNOSIS — Z992 Dependence on renal dialysis: Secondary | ICD-10-CM | POA: Diagnosis not present

## 2020-05-03 DIAGNOSIS — N186 End stage renal disease: Secondary | ICD-10-CM | POA: Diagnosis not present

## 2020-05-03 DIAGNOSIS — T8249XA Other complication of vascular dialysis catheter, initial encounter: Secondary | ICD-10-CM | POA: Diagnosis not present

## 2020-05-05 DIAGNOSIS — N186 End stage renal disease: Secondary | ICD-10-CM | POA: Diagnosis not present

## 2020-05-05 DIAGNOSIS — T8249XA Other complication of vascular dialysis catheter, initial encounter: Secondary | ICD-10-CM | POA: Diagnosis not present

## 2020-05-05 DIAGNOSIS — Z992 Dependence on renal dialysis: Secondary | ICD-10-CM | POA: Diagnosis not present

## 2020-05-05 DIAGNOSIS — N2581 Secondary hyperparathyroidism of renal origin: Secondary | ICD-10-CM | POA: Diagnosis not present

## 2020-05-09 DIAGNOSIS — N186 End stage renal disease: Secondary | ICD-10-CM | POA: Diagnosis not present

## 2020-05-09 DIAGNOSIS — T8249XA Other complication of vascular dialysis catheter, initial encounter: Secondary | ICD-10-CM | POA: Diagnosis not present

## 2020-05-09 DIAGNOSIS — Z992 Dependence on renal dialysis: Secondary | ICD-10-CM | POA: Diagnosis not present

## 2020-05-09 DIAGNOSIS — N2581 Secondary hyperparathyroidism of renal origin: Secondary | ICD-10-CM | POA: Diagnosis not present

## 2020-05-10 DIAGNOSIS — N186 End stage renal disease: Secondary | ICD-10-CM | POA: Diagnosis not present

## 2020-05-10 DIAGNOSIS — N2581 Secondary hyperparathyroidism of renal origin: Secondary | ICD-10-CM | POA: Diagnosis not present

## 2020-05-10 DIAGNOSIS — Z992 Dependence on renal dialysis: Secondary | ICD-10-CM | POA: Diagnosis not present

## 2020-05-10 DIAGNOSIS — T8249XA Other complication of vascular dialysis catheter, initial encounter: Secondary | ICD-10-CM | POA: Diagnosis not present

## 2020-05-12 DIAGNOSIS — Z992 Dependence on renal dialysis: Secondary | ICD-10-CM | POA: Diagnosis not present

## 2020-05-12 DIAGNOSIS — N186 End stage renal disease: Secondary | ICD-10-CM | POA: Diagnosis not present

## 2020-05-12 DIAGNOSIS — T8249XA Other complication of vascular dialysis catheter, initial encounter: Secondary | ICD-10-CM | POA: Diagnosis not present

## 2020-05-12 DIAGNOSIS — N2581 Secondary hyperparathyroidism of renal origin: Secondary | ICD-10-CM | POA: Diagnosis not present

## 2020-05-15 DIAGNOSIS — Z992 Dependence on renal dialysis: Secondary | ICD-10-CM | POA: Diagnosis not present

## 2020-05-15 DIAGNOSIS — N186 End stage renal disease: Secondary | ICD-10-CM | POA: Diagnosis not present

## 2020-05-15 DIAGNOSIS — T8249XA Other complication of vascular dialysis catheter, initial encounter: Secondary | ICD-10-CM | POA: Diagnosis not present

## 2020-05-15 DIAGNOSIS — N2581 Secondary hyperparathyroidism of renal origin: Secondary | ICD-10-CM | POA: Diagnosis not present

## 2020-05-17 ENCOUNTER — Ambulatory Visit: Payer: BC Managed Care – PPO | Attending: Nurse Practitioner | Admitting: Nurse Practitioner

## 2020-05-17 ENCOUNTER — Other Ambulatory Visit: Payer: Self-pay

## 2020-05-17 VITALS — BP 127/71 | HR 76 | Temp 97.7°F | Ht 68.0 in | Wt 320.0 lb

## 2020-05-17 DIAGNOSIS — R972 Elevated prostate specific antigen [PSA]: Secondary | ICD-10-CM

## 2020-05-17 DIAGNOSIS — R569 Unspecified convulsions: Secondary | ICD-10-CM

## 2020-05-17 DIAGNOSIS — Z0130 Encounter for examination of blood pressure without abnormal findings: Secondary | ICD-10-CM | POA: Diagnosis not present

## 2020-05-17 DIAGNOSIS — E1121 Type 2 diabetes mellitus with diabetic nephropathy: Secondary | ICD-10-CM | POA: Diagnosis not present

## 2020-05-17 DIAGNOSIS — I10 Essential (primary) hypertension: Secondary | ICD-10-CM | POA: Diagnosis not present

## 2020-05-17 DIAGNOSIS — I120 Hypertensive chronic kidney disease with stage 5 chronic kidney disease or end stage renal disease: Secondary | ICD-10-CM | POA: Insufficient documentation

## 2020-05-17 DIAGNOSIS — Z7982 Long term (current) use of aspirin: Secondary | ICD-10-CM | POA: Diagnosis not present

## 2020-05-17 DIAGNOSIS — E785 Hyperlipidemia, unspecified: Secondary | ICD-10-CM | POA: Diagnosis not present

## 2020-05-17 DIAGNOSIS — Z992 Dependence on renal dialysis: Secondary | ICD-10-CM | POA: Diagnosis not present

## 2020-05-17 DIAGNOSIS — Z79899 Other long term (current) drug therapy: Secondary | ICD-10-CM | POA: Insufficient documentation

## 2020-05-17 DIAGNOSIS — E1122 Type 2 diabetes mellitus with diabetic chronic kidney disease: Secondary | ICD-10-CM | POA: Insufficient documentation

## 2020-05-17 DIAGNOSIS — N186 End stage renal disease: Secondary | ICD-10-CM | POA: Diagnosis not present

## 2020-05-17 DIAGNOSIS — E1165 Type 2 diabetes mellitus with hyperglycemia: Secondary | ICD-10-CM | POA: Diagnosis not present

## 2020-05-17 DIAGNOSIS — Z794 Long term (current) use of insulin: Secondary | ICD-10-CM | POA: Insufficient documentation

## 2020-05-17 DIAGNOSIS — Z87898 Personal history of other specified conditions: Secondary | ICD-10-CM

## 2020-05-17 DIAGNOSIS — IMO0002 Reserved for concepts with insufficient information to code with codable children: Secondary | ICD-10-CM

## 2020-05-17 LAB — POCT GLYCOSYLATED HEMOGLOBIN (HGB A1C): Hemoglobin A1C: 13.4 % — AB (ref 4.0–5.6)

## 2020-05-17 LAB — GLUCOSE, POCT (MANUAL RESULT ENTRY): POC Glucose: 119 mg/dl — AB (ref 70–99)

## 2020-05-17 NOTE — Progress Notes (Signed)
Assessment & Plan:  Jeremy [name] was seen today for blood pressure check.  Diagnoses and all orders for this visit:  Uncontrolled type 2 diabetes mellitus with diabetic nephropathy, with long-term current use of insulin (HCC) -     HgB A1c -     Glucose (CBG) -     CMP14+EGFR -     Ambulatory referral to Ophthalmology  Essential hypertension Continue all antihypertensives as prescribed.  Remember to bring in your blood pressure log with you for your follow up appointment.  DASH/Mediterranean Diets are healthier choices for HTN.    Dyslipidemia, goal LDL below 70 INSTRUCTIONS: Work on a low fat, heart healthy diet and participate in regular aerobic exercise program by working out at least 150 minutes per week; 5 days a week-30 minutes per day. Avoid red meat/beef/steak,  fried foods. junk foods, sodas, sugary drinks, unhealthy snacking, alcohol and smoking.  Drink at least 80 oz of water per day and monitor your carbohydrate intake daily.    ESRD on dialysis ([place]) -     CBC  History of seizures -     Levetiracetam level  PSA elevation -     PSA  HE left prior to having labs drawn today. States he will come back for blood work and was unable to stay.     Patient has been counseled on age-appropriate routine health concerns for screening and prevention. These are reviewed and up-to-date. Referrals have been placed accordingly. Immunizations are up-to-date or declined.    Subjective:   Chief Complaint  Patient presents with  . Blood Pressure Check    Pt. is here for blood pressure check.    HPI Jeremy [name] 54 y.o. male presents to office today for follow up. PMH:  Abscess,  Bell's palsy, ESRD on HD, High cholesterol, Hypertension,Seizures ([place]) (08/04/2017), and Poorly controlledType II diabetes mellitus ([place]).  He is very upset stating he needs to be seen in the office and not tele visits for his follow ups. I did discuss with him that he had canceled a few of his  appointments and also no showed in the past for a few of his visits that had been scheduled as office instead of tele.  He has a history of medication non adherence.   Essential Hypertension Well controlled.He is currently taking amlodipine 10 mg daily, carvedilol 6.25 mg BID, enalapril 5 mg daily. States he has not been taking hydralazine and was not aware he had been prescribed this medication although he has been prescribed this medication for a few years. Denies chest pain,  palpitations, lightheadedness, dizziness, headaches or BLE edema.  BP Readings from Last 3 Encounters:  05/17/20 127/71  04/25/20 (!) 160/91  03/23/20 (!) 144/99    DM TYPE 2 Poorly controlled. Increased from 8 to 13.  He is checking his blood glucose levels only about 3 times per week. Drinking koolaid and sodas. Not dietary adherent. Current medications include: Humalog 15-25 units 2 times per day and ozempic 0.375 mg weekly. I am increasing his ozempic to 0.67m weekly.  "My plan is to get back on the wagon. After Thanksgiving I'm chilling out". He does not want to adjust any of his medications or for me to add any medications today. He also tells me he misses some of his HD appointments and knows what's best for his body. LDL not at goal with atorvastatin 40 mg daily which he is not taking consistently.  Lab Results  Component Value Date   HGBA1C  13.4 (A) 05/17/2020   Lab Results  Component Value Date   HGBA1C 8.1 (H) 11/09/2019   Lab Results  Component Value Date   LDLCALC 80 11/09/2019   Review of Systems  Constitutional: Negative for fever, malaise/fatigue and weight loss.  HENT: Negative.  Negative for nosebleeds.   Eyes: Negative.  Negative for blurred vision, double vision and photophobia.  Respiratory: Negative.  Negative for cough and shortness of breath.   Cardiovascular: Negative.  Negative for chest pain, palpitations and leg swelling.  Gastrointestinal: Negative.  Negative for heartburn, nausea  and vomiting.  Musculoskeletal: Negative.  Negative for myalgias.  Neurological: Negative.  Negative for dizziness, focal weakness, seizures (denies any recent seizure activity) and headaches.  Psychiatric/Behavioral: Negative.  Negative for suicidal ideas.    Past Medical History:  Diagnosis Date  . Abscess   . AKI (acute kidney injury) ([place]) 08/05/2017  . Bell's palsy    right eye abn since dx bell's palsy  . CKD (chronic kidney disease)    Dr. McDiarmind   . Dyspnea    occasional with exertion  . Headache   . High cholesterol   . Hypertension   . Left shoulder pain 10/26/2013   S/p injection on 10/26/13   . Renal insufficiency 02/14/2014  . Seizures ([place]) 08/04/2017   "related to high blood sugars" (08/05/2017)  . Type II diabetes mellitus ([place])     Past Surgical History:  Procedure Laterality Date  . AV FISTULA PLACEMENT Left 11/11/2019   Procedure: LEFT FOREARM RADIOCEPHALIC ARTERIOVENOUS (AV) FISTULA CREATION;  Surgeon: [name], MD;  Location: [facility];  Service: Vascular;  Laterality: Left;  . Eyelid surgery Right   . I & D EXTREMITY  11/11/2011   Procedure: IRRIGATION AND DEBRIDEMENT EXTREMITY;  Surgeon: Merrie Roof, MD;  Location: [facility];  Service: General;  Laterality: Right;  I&D Right Thigh Abscess  . IR FLUORO GUIDE CV LINE RIGHT  08/12/2019  . IR US GUIDE VASC ACCESS RIGHT  08/12/2019  . REVISON OF ARTERIOVENOUS FISTULA Left 03/23/2020   Procedure: LEFT ARM ARTERIOVENOUS FISTULA REVISON, LIGATION OF SIDE BRANCHES AND ELEVATION;  Surgeon: [name], MD;  Location: [facility];  Service: Vascular;  Laterality: Left;    Family History  Problem Relation Age of Onset  . Diabetes Mother   . Heart disease Mother 23  . Diabetes Sister   . Diabetes Brother   . Diabetes Brother   . Cancer Maternal Uncle        type unknown  . Stroke Neg Hx     Social History Reviewed with no changes to be made today.   Outpatient Medications Prior to Visit  Medication  Sig Dispense Refill  . amLODipine (NORVASC) 10 MG tablet Take 10 mg by mouth daily.    Marland Kitchen aspirin EC 81 MG tablet Take 1 tablet (81 mg total) by mouth daily. 90 tablet 3  . atorvastatin (LIPITOR) 40 MG tablet Take 1 tablet (40 mg total) by mouth daily. 90 tablet 0  . Blood Glucose Monitoring Suppl (CONTOUR NEXT MONITOR) w/Device KIT 1 Device by Does not apply route 4 (four) times daily - after meals and at bedtime. 1 kit 0  . carvedilol (COREG) 6.25 MG tablet Take 1 tablet (6.25 mg total) by mouth 2 (two) times daily with a meal. 180 tablet 1  . enalapril (VASOTEC) 5 MG tablet TAKE 1 TABLET BY MOUTH EVERY DAY (Patient taking differently: Take 5 mg by mouth daily. ) 90  tablet 0  . glucose blood test strip Use as instructed. Inject into the skin 3 times daily E11.65 200 each 12  . Insulin Pen Needle (B-D ULTRAFINE III SHORT PEN) 31G X 8 MM MISC 1 application by Does not apply route 3 (three) times daily. E11.65 200 each 11  . Lancet Devices (MICROLET NEXT LANCING DEVICE) MISC 1 Device by Does not apply route 3 (three) times daily. 1 each 11  . levETIRAcetam (KEPPRA XR) 500 MG 24 hr tablet Take 2 tablets (1,000 mg total) by mouth daily. (Patient taking differently: Take 500 mg by mouth in the morning and at bedtime. ) 180 tablet 3  . lidocaine-prilocaine (EMLA) cream SMARTSIG:Sparingly Topical    . Microlet Lancets MISC 1 Device by Does not apply route 3 (three) times daily. E11.65 200 each 11  . VELPHORO 500 MG chewable tablet Chew 500 mg by mouth 3 (three) times daily.    . insulin lispro (HUMALOG KWIKPEN) 100 UNIT/ML KwikPen Inject 0.1-0.15 mLs (10-15 Units total) into the skin 2 (two) times daily before a meal. (Patient taking differently: Inject 22 Units into the skin in the morning and at bedtime. ) 15 mL 6  . Semaglutide,0.25 or 0.5MG/DOS, (OZEMPIC, 0.25 OR 0.5 MG/DOSE,) 2 MG/1.5ML SOPN Inject 0.375 mg into the skin once a week. 9.97 mg plus 5 clicks (Patient taking differently: Inject 0.375 mg  into the skin every Monday. 7.41 mg plus 5 clicks) 3 pen 6  . hydrALAZINE (APRESOLINE) 25 MG tablet Take 1 tablet (25 mg total) by mouth 3 (three) times daily. (Patient not taking: Reported on 03/17/2020) 270 tablet 0  . traZODone (DESYREL) 50 MG tablet Take 1-2 tablets (50-100 mg total) by mouth at bedtime. (Patient taking differently: Take 50-100 mg by mouth at bedtime as needed for sleep. ) 60 tablet 2  . oxyCODONE-acetaminophen (PERCOCET) 10-325 MG tablet Take 1 tablet by mouth every 4 (four) hours as needed for pain. (Patient not taking: Reported on 05/17/2020) 16 tablet 0   No facility-administered medications prior to visit.    Allergies  Allergen Reactions  . Amlodipine Other (See Comments)    Dizziness       Objective:    BP 127/71 (BP Location: Right Arm, Patient Position: Sitting, Cuff Size: Large)   Pulse 76   Temp 97.7 F (36.5 C) (Temporal)   Ht 5' 8" (1.727 m)   Wt (!) 320 lb (145.2 kg)   SpO2 100%   BMI 48.66 kg/m  Wt Readings from Last 3 Encounters:  05/17/20 (!) 320 lb (145.2 kg)  04/25/20 (!) 316 lb (143.3 kg)  03/23/20 (!) 313 lb (142 kg)    Physical Exam Vitals and nursing note reviewed.  Constitutional:      Appearance: He is well-developed.  HENT:     Head: Normocephalic and atraumatic.  Cardiovascular:     Rate and Rhythm: Normal rate and regular rhythm.     Heart sounds: Normal heart sounds. No murmur heard.  No friction rub. No gallop.   Pulmonary:     Effort: Pulmonary effort is normal. No tachypnea or respiratory distress.     Breath sounds: Normal breath sounds. No decreased breath sounds, wheezing, rhonchi or rales.  Chest:     Chest wall: No tenderness.  Abdominal:     General: Bowel sounds are normal.     Palpations: Abdomen is soft.  Musculoskeletal:        General: Normal range of motion.     Cervical back: Normal  range of motion.  Skin:    General: Skin is warm and dry.  Neurological:     Mental Status: He is alert and oriented  to person, place, and time.     Coordination: Coordination normal.  Psychiatric:        Behavior: Behavior normal. Behavior is cooperative.        Thought Content: Thought content normal.        Judgment: Judgment normal.          Patient has been counseled extensively about nutrition and exercise as well as the importance of adherence with medications and regular follow-up. The patient was given clear instructions to go to ER or return to medical center if symptoms don't improve, worsen or new problems develop. The patient verbalized understanding.   Follow-up: Return for EKG nurse visit. See me in 3 months.   Gildardo Pounds, FNP-BC [facility] [place], [place]   05/22/2020, 7:45 PM

## 2020-05-19 DIAGNOSIS — N186 End stage renal disease: Secondary | ICD-10-CM | POA: Diagnosis not present

## 2020-05-19 DIAGNOSIS — N2581 Secondary hyperparathyroidism of renal origin: Secondary | ICD-10-CM | POA: Diagnosis not present

## 2020-05-19 DIAGNOSIS — T8249XA Other complication of vascular dialysis catheter, initial encounter: Secondary | ICD-10-CM | POA: Diagnosis not present

## 2020-05-19 DIAGNOSIS — Z992 Dependence on renal dialysis: Secondary | ICD-10-CM | POA: Diagnosis not present

## 2020-05-21 DIAGNOSIS — N186 End stage renal disease: Secondary | ICD-10-CM | POA: Diagnosis not present

## 2020-05-21 DIAGNOSIS — Z992 Dependence on renal dialysis: Secondary | ICD-10-CM | POA: Diagnosis not present

## 2020-05-21 DIAGNOSIS — I129 Hypertensive chronic kidney disease with stage 1 through stage 4 chronic kidney disease, or unspecified chronic kidney disease: Secondary | ICD-10-CM | POA: Diagnosis not present

## 2020-05-22 ENCOUNTER — Other Ambulatory Visit: Payer: Self-pay | Admitting: Nurse Practitioner

## 2020-05-22 ENCOUNTER — Encounter: Payer: Self-pay | Admitting: Nurse Practitioner

## 2020-05-22 DIAGNOSIS — E1165 Type 2 diabetes mellitus with hyperglycemia: Secondary | ICD-10-CM

## 2020-05-22 DIAGNOSIS — IMO0002 Reserved for concepts with insufficient information to code with codable children: Secondary | ICD-10-CM

## 2020-05-22 MED ORDER — OZEMPIC (0.25 OR 0.5 MG/DOSE) 2 MG/1.5ML ~~LOC~~ SOPN: 0.5000 mg | PEN_INJECTOR | SUBCUTANEOUS | 6 refills | Status: DC

## 2020-05-22 MED ORDER — NOVOLOG MIX 70/30 FLEXPEN (70-30) 100 UNIT/ML ~~LOC~~ SUPN: 15.0000 [IU] | PEN_INJECTOR | Freq: Two times a day (BID) | SUBCUTANEOUS | 11 refills | Status: DC

## 2020-05-23 ENCOUNTER — Ambulatory Visit: Payer: BC Managed Care – PPO

## 2020-05-24 DIAGNOSIS — N186 End stage renal disease: Secondary | ICD-10-CM | POA: Diagnosis not present

## 2020-05-24 DIAGNOSIS — Z992 Dependence on renal dialysis: Secondary | ICD-10-CM | POA: Diagnosis not present

## 2020-05-24 DIAGNOSIS — N2581 Secondary hyperparathyroidism of renal origin: Secondary | ICD-10-CM | POA: Diagnosis not present

## 2020-05-24 DIAGNOSIS — T8249XA Other complication of vascular dialysis catheter, initial encounter: Secondary | ICD-10-CM | POA: Diagnosis not present

## 2020-05-29 DIAGNOSIS — T8249XA Other complication of vascular dialysis catheter, initial encounter: Secondary | ICD-10-CM | POA: Diagnosis not present

## 2020-05-29 DIAGNOSIS — N186 End stage renal disease: Secondary | ICD-10-CM | POA: Diagnosis not present

## 2020-05-29 DIAGNOSIS — N2581 Secondary hyperparathyroidism of renal origin: Secondary | ICD-10-CM | POA: Diagnosis not present

## 2020-05-29 DIAGNOSIS — Z992 Dependence on renal dialysis: Secondary | ICD-10-CM | POA: Diagnosis not present

## 2020-05-31 DIAGNOSIS — T8249XA Other complication of vascular dialysis catheter, initial encounter: Secondary | ICD-10-CM | POA: Diagnosis not present

## 2020-05-31 DIAGNOSIS — N186 End stage renal disease: Secondary | ICD-10-CM | POA: Diagnosis not present

## 2020-05-31 DIAGNOSIS — N2581 Secondary hyperparathyroidism of renal origin: Secondary | ICD-10-CM | POA: Diagnosis not present

## 2020-05-31 DIAGNOSIS — Z992 Dependence on renal dialysis: Secondary | ICD-10-CM | POA: Diagnosis not present

## 2020-06-01 DIAGNOSIS — Z452 Encounter for adjustment and management of vascular access device: Secondary | ICD-10-CM | POA: Diagnosis not present

## 2020-06-02 DIAGNOSIS — N2581 Secondary hyperparathyroidism of renal origin: Secondary | ICD-10-CM | POA: Diagnosis not present

## 2020-06-02 DIAGNOSIS — N186 End stage renal disease: Secondary | ICD-10-CM | POA: Diagnosis not present

## 2020-06-02 DIAGNOSIS — T8249XA Other complication of vascular dialysis catheter, initial encounter: Secondary | ICD-10-CM | POA: Diagnosis not present

## 2020-06-02 DIAGNOSIS — Z992 Dependence on renal dialysis: Secondary | ICD-10-CM | POA: Diagnosis not present

## 2020-06-07 ENCOUNTER — Other Ambulatory Visit: Payer: Self-pay | Admitting: Nurse Practitioner

## 2020-06-07 DIAGNOSIS — N2581 Secondary hyperparathyroidism of renal origin: Secondary | ICD-10-CM | POA: Diagnosis not present

## 2020-06-07 DIAGNOSIS — F5101 Primary insomnia: Secondary | ICD-10-CM

## 2020-06-07 DIAGNOSIS — Z992 Dependence on renal dialysis: Secondary | ICD-10-CM | POA: Diagnosis not present

## 2020-06-07 DIAGNOSIS — Z23 Encounter for immunization: Secondary | ICD-10-CM | POA: Diagnosis not present

## 2020-06-07 DIAGNOSIS — N186 End stage renal disease: Secondary | ICD-10-CM | POA: Diagnosis not present

## 2020-06-08 DIAGNOSIS — N2581 Secondary hyperparathyroidism of renal origin: Secondary | ICD-10-CM | POA: Diagnosis not present

## 2020-06-08 DIAGNOSIS — Z23 Encounter for immunization: Secondary | ICD-10-CM | POA: Diagnosis not present

## 2020-06-08 DIAGNOSIS — Z992 Dependence on renal dialysis: Secondary | ICD-10-CM | POA: Diagnosis not present

## 2020-06-08 DIAGNOSIS — N186 End stage renal disease: Secondary | ICD-10-CM | POA: Diagnosis not present

## 2020-06-09 DIAGNOSIS — N186 End stage renal disease: Secondary | ICD-10-CM | POA: Diagnosis not present

## 2020-06-09 DIAGNOSIS — Z992 Dependence on renal dialysis: Secondary | ICD-10-CM | POA: Diagnosis not present

## 2020-06-09 DIAGNOSIS — N2581 Secondary hyperparathyroidism of renal origin: Secondary | ICD-10-CM | POA: Diagnosis not present

## 2020-06-09 DIAGNOSIS — Z23 Encounter for immunization: Secondary | ICD-10-CM | POA: Diagnosis not present

## 2020-06-11 DIAGNOSIS — N2581 Secondary hyperparathyroidism of renal origin: Secondary | ICD-10-CM | POA: Diagnosis not present

## 2020-06-11 DIAGNOSIS — N186 End stage renal disease: Secondary | ICD-10-CM | POA: Diagnosis not present

## 2020-06-11 DIAGNOSIS — Z992 Dependence on renal dialysis: Secondary | ICD-10-CM | POA: Diagnosis not present

## 2020-06-16 DIAGNOSIS — N2581 Secondary hyperparathyroidism of renal origin: Secondary | ICD-10-CM | POA: Diagnosis not present

## 2020-06-16 DIAGNOSIS — N186 End stage renal disease: Secondary | ICD-10-CM | POA: Diagnosis not present

## 2020-06-16 DIAGNOSIS — Z992 Dependence on renal dialysis: Secondary | ICD-10-CM | POA: Diagnosis not present

## 2020-06-19 ENCOUNTER — Ambulatory Visit: Payer: Self-pay

## 2020-06-19 DIAGNOSIS — Z992 Dependence on renal dialysis: Secondary | ICD-10-CM | POA: Diagnosis not present

## 2020-06-19 DIAGNOSIS — N186 End stage renal disease: Secondary | ICD-10-CM | POA: Diagnosis not present

## 2020-06-19 DIAGNOSIS — N2581 Secondary hyperparathyroidism of renal origin: Secondary | ICD-10-CM | POA: Diagnosis not present

## 2020-06-19 NOTE — Telephone Encounter (Signed)
   EW 1  Jeremy [name] Male, 54 y.o., [birth year] MRN:  [id number] Phone:  [phone] Jerilynn Mages) PCP:  Gildardo Pounds, NP Primary Cvg:  Sherre Poot [employer] Ppo Next Appt With Internal Medicine 08/15/2020 at 8:30 AM Message from Lennox Solders sent at 06/19/2020 8:29 AM EST  Summary: please advise   Pt is calling and has been having left side sharp pain in his head for 2 wks. Please advise. No openings at [facility]         Call History   Type Contact Phone User  06/19/2020 08:27 AM EST Phone (Incoming) Zariah, Jost (Self) [phone] Lemmie Evens) Andrena Mews J   Pt. Reports he started having a left sided headache 2 weeks ago. Pain comes and goes. Sometimes the left ear hurts as well. Not sure if it could "be a tooth issue." "Taking BC's but it doesn't help." No other symptoms. Per Mallory in the practice, no availability until December 21. Pt. Will go to UC. Answer Assessment - Initial Assessment Questions 1. LOCATION: "Where does it hurt?"      Left side 2. ONSET: "When did the headache start?" (Minutes, hours or days)      2 weeks ago 3. PATTERN: "Does the pain come and go, or has it been constant since it started?"     Comes and goes 4. SEVERITY: "How bad is the pain?" and "What does it keep you from doing?"  (e.g., Scale 1-10; mild, moderate, or severe)   - MILD (1-3): doesn't interfere with normal activities    - MODERATE (4-7): interferes with normal activities or awakens from sleep    - SEVERE (8-10): excruciating pain, unable to do any normal activities        Can be a 10. 5. RECURRENT SYMPTOM: "Have you ever had headaches before?" If Yes, ask: "When was the last time?" and "What happened that time?"      No 6. CAUSE: "What do you think is causing the headache?"     Unsure 7. MIGRAINE: "Have you been diagnosed with migraine headaches?" If Yes, ask: "Is this headache similar?"      No 8. HEAD INJURY: "Has there been any recent injury to the head?"      No 9.  OTHER SYMPTOMS: "Do you have any other symptoms?" (fever, stiff neck, eye pain, sore throat, cold symptoms)     Ear 10. PREGNANCY: "Is there any chance you are pregnant?" "When was your last menstrual period?"       N/A  Protocols used: HEADACHE-A-AH

## 2020-06-20 DIAGNOSIS — N186 End stage renal disease: Secondary | ICD-10-CM | POA: Diagnosis not present

## 2020-06-20 DIAGNOSIS — I129 Hypertensive chronic kidney disease with stage 1 through stage 4 chronic kidney disease, or unspecified chronic kidney disease: Secondary | ICD-10-CM | POA: Diagnosis not present

## 2020-06-20 DIAGNOSIS — Z992 Dependence on renal dialysis: Secondary | ICD-10-CM | POA: Diagnosis not present

## 2020-06-23 DIAGNOSIS — Z992 Dependence on renal dialysis: Secondary | ICD-10-CM | POA: Diagnosis not present

## 2020-06-23 DIAGNOSIS — N186 End stage renal disease: Secondary | ICD-10-CM | POA: Diagnosis not present

## 2020-06-23 DIAGNOSIS — N2581 Secondary hyperparathyroidism of renal origin: Secondary | ICD-10-CM | POA: Diagnosis not present

## 2020-06-26 DIAGNOSIS — N2581 Secondary hyperparathyroidism of renal origin: Secondary | ICD-10-CM | POA: Diagnosis not present

## 2020-06-26 DIAGNOSIS — N186 End stage renal disease: Secondary | ICD-10-CM | POA: Diagnosis not present

## 2020-06-26 DIAGNOSIS — Z992 Dependence on renal dialysis: Secondary | ICD-10-CM | POA: Diagnosis not present

## 2020-06-28 DIAGNOSIS — N2581 Secondary hyperparathyroidism of renal origin: Secondary | ICD-10-CM | POA: Diagnosis not present

## 2020-06-28 DIAGNOSIS — Z992 Dependence on renal dialysis: Secondary | ICD-10-CM | POA: Diagnosis not present

## 2020-06-28 DIAGNOSIS — N186 End stage renal disease: Secondary | ICD-10-CM | POA: Diagnosis not present

## 2020-06-30 DIAGNOSIS — N186 End stage renal disease: Secondary | ICD-10-CM | POA: Diagnosis not present

## 2020-06-30 DIAGNOSIS — Z992 Dependence on renal dialysis: Secondary | ICD-10-CM | POA: Diagnosis not present

## 2020-06-30 DIAGNOSIS — N2581 Secondary hyperparathyroidism of renal origin: Secondary | ICD-10-CM | POA: Diagnosis not present

## 2020-07-05 DIAGNOSIS — Z992 Dependence on renal dialysis: Secondary | ICD-10-CM | POA: Diagnosis not present

## 2020-07-05 DIAGNOSIS — N186 End stage renal disease: Secondary | ICD-10-CM | POA: Diagnosis not present

## 2020-07-05 DIAGNOSIS — N2581 Secondary hyperparathyroidism of renal origin: Secondary | ICD-10-CM | POA: Diagnosis not present

## 2020-07-06 DIAGNOSIS — Z992 Dependence on renal dialysis: Secondary | ICD-10-CM | POA: Diagnosis not present

## 2020-07-06 DIAGNOSIS — N2581 Secondary hyperparathyroidism of renal origin: Secondary | ICD-10-CM | POA: Diagnosis not present

## 2020-07-06 DIAGNOSIS — N186 End stage renal disease: Secondary | ICD-10-CM | POA: Diagnosis not present

## 2020-07-07 DIAGNOSIS — N2581 Secondary hyperparathyroidism of renal origin: Secondary | ICD-10-CM | POA: Diagnosis not present

## 2020-07-07 DIAGNOSIS — N186 End stage renal disease: Secondary | ICD-10-CM | POA: Diagnosis not present

## 2020-07-07 DIAGNOSIS — Z992 Dependence on renal dialysis: Secondary | ICD-10-CM | POA: Diagnosis not present

## 2020-07-08 ENCOUNTER — Other Ambulatory Visit: Payer: Self-pay | Admitting: Nurse Practitioner

## 2020-07-08 DIAGNOSIS — I10 Essential (primary) hypertension: Secondary | ICD-10-CM

## 2020-07-08 DIAGNOSIS — E1169 Type 2 diabetes mellitus with other specified complication: Secondary | ICD-10-CM

## 2020-07-08 NOTE — Telephone Encounter (Signed)
Requested medication (s) are due for refill today: -  Requested medication (s) are on the active medication list: expired med  Last refill:  03/03/20  Future visit scheduled: yes  Notes to clinic:  prescription expired 06/01/20   Requested Prescriptions  Pending Prescriptions Disp Refills   hydrALAZINE (APRESOLINE) 25 MG tablet [Pharmacy Med Name: HYDRALAZINE 25 MG TABLET] 270 tablet     Sig: TAKE 1 TABLET BY MOUTH THREE TIMES A DAY      Cardiovascular:  Vasodilators Failed - 07/08/2020  2:05 PM      Failed - HCT in normal range and within 360 days    HCT  Date Value Ref Range Status  03/23/2020 36.0 (L) 39.0 - 52.0 % Final   Hematocrit  Date Value Ref Range Status  11/09/2019 33.8 (L) 37.5 - 51.0 % Final          Failed - HGB in normal range and within 360 days    Hemoglobin  Date Value Ref Range Status  03/23/2020 12.2 (L) 13.0 - 17.0 g/dL Final  11/09/2019 11.0 (L) 13.0 - 17.7 g/dL Final          Passed - RBC in normal range and within 360 days    RBC  Date Value Ref Range Status  11/09/2019 4.20 4.14 - 5.80 x10E6/uL Final  08/13/2019 4.86 4.22 - 5.81 MIL/uL Final          Passed - WBC in normal range and within 360 days    WBC  Date Value Ref Range Status  11/09/2019 7.0 3.4 - 10.8 x10E3/uL Final  08/13/2019 10.7 (H) 4.0 - 10.5 K/uL Final          Passed - PLT in normal range and within 360 days    Platelets  Date Value Ref Range Status  11/09/2019 208 150 - 450 x10E3/uL Final          Passed - Last BP in normal range    BP Readings from Last 1 Encounters:  05/17/20 127/71          Passed - Valid encounter within last 12 months    Recent Outpatient Visits           1 month ago Uncontrolled type 2 diabetes mellitus with diabetic nephropathy, with long-term current use of insulin ([place])   [place], [place] W, NP   4 months ago Uncontrolled type 2 diabetes mellitus with diabetic nephropathy, with long-term  current use of insulin ([place])   [place], Vernia Buff, NP   4 months ago [place], Deborah B, MD   7 months ago Essential hypertension   [place], Vernia Buff, NP   8 months ago Encounter to establish care   [place], Vernia Buff, NP       Future Appointments             In 1 month Gildardo Pounds, NP [place]              Signed Prescriptions Disp Refills   atorvastatin (LIPITOR) 40 MG tablet 90 tablet 3    Sig: TAKE 1 TABLET BY MOUTH EVERY DAY      Cardiovascular:  Antilipid - Statins Failed - 07/08/2020  2:05 PM      Failed - LDL in normal range and  within 360 days    LDL Chol Calc (NIH)  Date Value Ref Range Status  11/09/2019 80 0 - 99 mg/dL Final          Passed - Total Cholesterol in normal range and within 360 days    Cholesterol, Total  Date Value Ref Range Status  11/09/2019 144 100 - 199 mg/dL Final          Passed - HDL in normal range and within 360 days    HDL  Date Value Ref Range Status  11/09/2019 46 >39 mg/dL Final          Passed - Triglycerides in normal range and within 360 days    Triglycerides  Date Value Ref Range Status  11/09/2019 95 0 - 149 mg/dL Final          Passed - Patient is not pregnant      Passed - Valid encounter within last 12 months    Recent Outpatient Visits           1 month ago Uncontrolled type 2 diabetes mellitus with diabetic nephropathy, with long-term current use of insulin ([place])   [place], [place] W, NP   4 months ago Uncontrolled type 2 diabetes mellitus with diabetic nephropathy, with long-term current use of insulin [facility])   [place], Vernia Buff, NP   4 months ago [place],  Deborah B, MD   7 months ago Essential hypertension   [place], Vernia Buff, NP   8 months ago Encounter to establish care   [place], Vernia Buff, NP       Future Appointments             In 1 month Gildardo Pounds, NP [place]

## 2020-07-08 NOTE — Telephone Encounter (Signed)
Requested Prescriptions  Pending Prescriptions Disp Refills  . hydrALAZINE (APRESOLINE) 25 MG tablet [Pharmacy Med Name: HYDRALAZINE 25 MG TABLET] 270 tablet     Sig: TAKE 1 TABLET BY MOUTH THREE TIMES A DAY     Cardiovascular:  Vasodilators Failed - 07/08/2020  2:05 PM      Failed - HCT in normal range and within 360 days    HCT  Date Value Ref Range Status  03/23/2020 36.0 (L) 39.0 - 52.0 % Final   Hematocrit  Date Value Ref Range Status  11/09/2019 33.8 (L) 37.5 - 51.0 % Final         Failed - HGB in normal range and within 360 days    Hemoglobin  Date Value Ref Range Status  03/23/2020 12.2 (L) 13.0 - 17.0 g/dL Final  11/09/2019 11.0 (L) 13.0 - 17.7 g/dL Final         Passed - RBC in normal range and within 360 days    RBC  Date Value Ref Range Status  11/09/2019 4.20 4.14 - 5.80 x10E6/uL Final  08/13/2019 4.86 4.22 - 5.81 MIL/uL Final         Passed - WBC in normal range and within 360 days    WBC  Date Value Ref Range Status  11/09/2019 7.0 3.4 - 10.8 x10E3/uL Final  08/13/2019 10.7 (H) 4.0 - 10.5 K/uL Final         Passed - PLT in normal range and within 360 days    Platelets  Date Value Ref Range Status  11/09/2019 208 150 - 450 x10E3/uL Final         Passed - Last BP in normal range    BP Readings from Last 1 Encounters:  05/17/20 127/71         Passed - Valid encounter within last 12 months    Recent Outpatient Visits          1 month ago Uncontrolled type 2 diabetes mellitus with diabetic nephropathy, with long-term current use of insulin ([place])   [place], [place] W, NP   4 months ago Uncontrolled type 2 diabetes mellitus with diabetic nephropathy, with long-term current use of insulin [facility])   [place], Vernia Buff, NP   4 months ago [place], Deborah B, MD   7 months ago Essential hypertension   [place], Vernia Buff, NP   8 months ago Encounter to establish care   [place], Vernia Buff, NP      Future Appointments            In 1 month Gildardo Pounds, NP [place]           . atorvastatin (LIPITOR) 40 MG tablet [Pharmacy Med Name: ATORVASTATIN 40 MG TABLET] 90 tablet 3    Sig: TAKE 1 TABLET BY MOUTH EVERY DAY     Cardiovascular:  Antilipid - Statins Failed - 07/08/2020  2:05 PM      Failed - LDL in normal range and within 360 days    LDL Chol Calc (NIH)  Date Value Ref Range Status  11/09/2019 80 0 - 99 mg/dL Final         Passed - Total Cholesterol in normal range and within 360 days    Cholesterol, Total  Date Value Ref Range  Status  11/09/2019 144 100 - 199 mg/dL Final         Passed - HDL in normal range and within 360 days    HDL  Date Value Ref Range Status  11/09/2019 46 >39 mg/dL Final         Passed - Triglycerides in normal range and within 360 days    Triglycerides  Date Value Ref Range Status  11/09/2019 95 0 - 149 mg/dL Final         Passed - Patient is not pregnant      Passed - Valid encounter within last 12 months    Recent Outpatient Visits          1 month ago Uncontrolled type 2 diabetes mellitus with diabetic nephropathy, with long-term current use of insulin ([place])   [place], [place] W, NP   4 months ago Uncontrolled type 2 diabetes mellitus with diabetic nephropathy, with long-term current use of insulin [facility])   [place], Vernia Buff, NP   4 months ago [place], MD   7 months ago Essential hypertension   [place], Vernia Buff, NP   8 months ago Encounter to establish care   [place], Vernia Buff, NP      Future Appointments             In 1 month Gildardo Pounds, NP [place]

## 2020-07-21 DIAGNOSIS — N186 End stage renal disease: Secondary | ICD-10-CM | POA: Diagnosis not present

## 2020-07-21 DIAGNOSIS — Z992 Dependence on renal dialysis: Secondary | ICD-10-CM | POA: Diagnosis not present

## 2020-07-21 DIAGNOSIS — I129 Hypertensive chronic kidney disease with stage 1 through stage 4 chronic kidney disease, or unspecified chronic kidney disease: Secondary | ICD-10-CM | POA: Diagnosis not present

## 2020-08-15 ENCOUNTER — Encounter: Payer: Self-pay | Admitting: Nurse Practitioner

## 2020-08-15 ENCOUNTER — Ambulatory Visit: Payer: Medicare Other | Attending: Nurse Practitioner | Admitting: Nurse Practitioner

## 2020-08-15 ENCOUNTER — Other Ambulatory Visit: Payer: Self-pay

## 2020-08-15 ENCOUNTER — Telehealth: Payer: Self-pay

## 2020-08-15 DIAGNOSIS — I10 Essential (primary) hypertension: Secondary | ICD-10-CM | POA: Diagnosis not present

## 2020-08-15 DIAGNOSIS — Z794 Long term (current) use of insulin: Secondary | ICD-10-CM

## 2020-08-15 DIAGNOSIS — E1121 Type 2 diabetes mellitus with diabetic nephropathy: Secondary | ICD-10-CM

## 2020-08-15 DIAGNOSIS — E1169 Type 2 diabetes mellitus with other specified complication: Secondary | ICD-10-CM

## 2020-08-15 DIAGNOSIS — E1165 Type 2 diabetes mellitus with hyperglycemia: Secondary | ICD-10-CM | POA: Diagnosis not present

## 2020-08-15 DIAGNOSIS — E782 Mixed hyperlipidemia: Secondary | ICD-10-CM

## 2020-08-15 DIAGNOSIS — IMO0002 Reserved for concepts with insufficient information to code with codable children: Secondary | ICD-10-CM

## 2020-08-15 MED ORDER — OZEMPIC (1 MG/DOSE) 2 MG/1.5ML ~~LOC~~ SOPN
1.0000 mg | PEN_INJECTOR | SUBCUTANEOUS | 1 refills | Status: AC
Start: 2020-08-15 — End: 2020-11-13

## 2020-08-15 MED ORDER — ATORVASTATIN CALCIUM 40 MG PO TABS: 40.0000 mg | ORAL_TABLET | Freq: Every day | ORAL | 3 refills | Status: DC

## 2020-08-15 MED ORDER — AMLODIPINE BESYLATE 10 MG PO TABS: 10.0000 mg | ORAL_TABLET | Freq: Every day | ORAL | 1 refills | Status: DC

## 2020-08-15 NOTE — Telephone Encounter (Signed)
Prior auth for Ozempic approved until 08/15/21

## 2020-08-15 NOTE — Progress Notes (Signed)
Virtual Visit via Telephone Note Due to national recommendations of social distancing due to Axis 19, telehealth visit is felt to be most appropriate for this patient at this time.  I discussed the limitations, risks, security and privacy concerns of performing an evaluation and management service by telephone and the availability of in person appointments. I also discussed with the patient that there may be a patient responsible charge related to this service. The patient expressed understanding and agreed to proceed.    I connected with Jeremy [name] on 08/15/20  at   8:30 AM EST  EDT by telephone and verified that I am speaking with the correct person using two identifiers.   Consent I discussed the limitations, risks, security and privacy concerns of performing an evaluation and management service by telephone and the availability of in person appointments. I also discussed with the patient that there may be a patient responsible charge related to this service. The patient expressed understanding and agreed to proceed.   Location of Patient: Private Residence   Location of Provider: [place] and [employer] Office    Persons participating in Telemedicine visit: Jeremy Rankins FNP-BC [place]    History of Present Illness: Telemedicine visit for: Follow up PMH:  Abscess,  Bell's palsy, ESRD on HD, High cholesterol, Hypertension,Seizures ([place]) (08/04/2017), and Poorly controlledType II diabetes mellitus ([place]).  He is looking for a dentist and has been having difficulty accessing one. I did give him the number to a local dentist in the area  DM 2 Poorly controlled. States blood glucose levels are running "way too high". He is not diet or exercise adherent and has been drinking ice tea, pepsi, sprites, sugary drinks. Eating bananas, apples, grapes.  Have increased Ozempic from 0.5 mg to 1 mg weekly and he will continue on NovoLog 70/30 mix 15 units twice  daily.  We will have him return in a few weeks with his meter for meter check as we may have to increase his NovoLog as well.  LDL not at goal however he does endorse medication adherence taking atorvastatin 40 mg daily as prescribed.  Likely related to diet. Lab Results  Component Value Date   HGBA1C 13.4 (A) 05/17/2020   Lab Results  Component Value Date   LDLCALC 80 11/09/2019   ESSENTIAL HYPERTENSION Well-controlled however he is not taking hydralazine as prescribed.  States he was unaware that he was ever prescribed this medication.  He is currently taking amlodipine 10 mg daily, enalapril 5 mg daily, and carvedilol 6.25 mg twice daily. BP Readings from Last 3 Encounters:  05/17/20 127/71  04/25/20 (!) 160/91  03/23/20 (!) 144/99     Past Medical History:  Diagnosis Date  . Abscess   . AKI (acute kidney injury) ([place]) 08/05/2017  . Bell's palsy    right eye abn since dx bell's palsy  . CKD (chronic kidney disease)    Dr. McDiarmind   . Dyspnea    occasional with exertion  . Headache   . High cholesterol   . Hypertension   . Left shoulder pain 10/26/2013   S/p injection on 10/26/13   . Renal insufficiency 02/14/2014  . Seizures ([place]) 08/04/2017   "related to high blood sugars" (08/05/2017)  . Type II diabetes mellitus ([place])     Past Surgical History:  Procedure Laterality Date  . AV FISTULA PLACEMENT Left 11/11/2019   Procedure: LEFT FOREARM RADIOCEPHALIC ARTERIOVENOUS (AV) FISTULA CREATION;  Surgeon: [name], MD;  Location: [facility];  Service: Vascular;  Laterality: Left;  . Eyelid surgery Right   . I & D EXTREMITY  11/11/2011   Procedure: IRRIGATION AND DEBRIDEMENT EXTREMITY;  Surgeon: Merrie Roof, MD;  Location: [place];  Service: General;  Laterality: Right;  I&D Right Thigh Abscess  . IR FLUORO GUIDE CV LINE RIGHT  08/12/2019  . IR US GUIDE VASC ACCESS RIGHT  08/12/2019  . REVISON OF ARTERIOVENOUS FISTULA Left 03/23/2020   Procedure: LEFT ARM ARTERIOVENOUS  FISTULA REVISON, LIGATION OF SIDE BRANCHES AND ELEVATION;  Surgeon: [name], MD;  Location: [facility];  Service: Vascular;  Laterality: Left;    Family History  Problem Relation Age of Onset  . Diabetes Mother   . Heart disease Mother 49  . Diabetes Sister   . Diabetes Brother   . Diabetes Brother   . Cancer Maternal Uncle        type unknown  . Stroke Neg Hx     Social History   Socioeconomic History  . Marital status: Divorced    Spouse name: Not on file  . Number of children: 3  . Years of education: Not on file  . Highest education level: Not on file  Occupational History  . Occupation: maintenance tech  Tobacco Use  . Smoking status: Never Smoker  . Smokeless tobacco: Never Used  Vaping Use  . Vaping Use: Never used  Substance and Sexual Activity  . Alcohol use: Not Currently  . Drug use: No  . Sexual activity: Yes  Other Topics Concern  . 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  Did not graduate high school.  Has 3 adult children in [place].  Divorced.  Lives with girlfriend [name].                   Social Determinants of Health   Financial Resource Strain: Not on file  Food Insecurity: Not on file  Transportation Needs: Not on file  Physical Activity: Unknown  . Days of Exercise per Week: 2 days  . Minutes of Exercise per Session: Not on file  Stress: Not on file  Social Connections: Not on file     Observations/Objective: Awake, alert and oriented x 3   Review of Systems  Constitutional: Negative for fever, malaise/fatigue and weight loss.  HENT: Negative for ear discharge, ear pain and nosebleeds.        Poor dentition  Eyes: Negative.  Negative for blurred vision, double vision and photophobia.  Respiratory: Negative.  Negative for cough and shortness of breath.   Cardiovascular: Negative.  Negative for chest pain, palpitations and leg swelling.   Gastrointestinal: Negative.  Negative for heartburn, nausea and vomiting.  Musculoskeletal: Negative.  Negative for myalgias.  Neurological: Negative.  Negative for dizziness, focal weakness, seizures and headaches.  Psychiatric/Behavioral: Negative.  Negative for suicidal ideas.    Assessment and Plan: Diagnoses and all orders for this visit:  Essential hypertension -     amLODipine (NORVASC) 10 MG tablet; Take 1 tablet (10 mg total) by mouth daily. Continue all antihypertensives as prescribed.  Remember to bring in your blood pressure log with you for your follow up appointment.  DASH/Mediterranean Diets are healthier choices for HTN.    Uncontrolled type 2 diabetes mellitus with diabetic nephropathy, with long-term current use of insulin (HCC) -     Semaglutide, 1 MG/DOSE, (OZEMPIC, 1 MG/DOSE,) 2 MG/1.5ML SOPN;  Inject 1 mg into the skin once a week. Continue blood sugar control as discussed in office today, low carbohydrate diet, and regular physical exercise as tolerated, 150 minutes per week (30 min each day, 5 days per week, or 50 min 3 days per week). Keep blood sugar logs with fasting goal of 90-130 mg/dl, post prandial (after you eat) less than 180.  For Hypoglycemia: BS <60 and Hyperglycemia BS >400; contact the clinic ASAP. Annual eye exams and foot exams are recommended.   Mixed hyperlipidemia due to type 2 diabetes mellitus (HCC) -     atorvastatin (LIPITOR) 40 MG tablet; Take 1 tablet (40 mg total) by mouth daily. INSTRUCTIONS: Work on a low fat, heart healthy diet and participate in regular aerobic exercise program by working out at least 150 minutes per week; 5 days a week-30 minutes per day. Avoid red meat/beef/steak,  fried foods. junk foods, sodas, sugary drinks, unhealthy snacking, alcohol and smoking.  Drink at least 80 oz of water per day and monitor your carbohydrate intake daily.     Follow Up Instructions Return in about 4 weeks (around 09/12/2020) for 4 weeks  meter check with Lurena Joiner and see me in 3 months.     I discussed the assessment and treatment plan with the patient. The patient was provided an opportunity to ask questions and all were answered. The patient agreed with the plan and demonstrated an understanding of the instructions.   The patient was advised to call back or seek an in-person evaluation if the symptoms worsen or if the condition fails to improve as anticipated.  I provided 14 minutes of non-face-to-face time during this encounter including median intraservice time, reviewing previous notes, labs, imaging, medications and explaining diagnosis and management.  Gildardo Pounds, FNP-BC

## 2020-08-16 ENCOUNTER — Other Ambulatory Visit: Payer: Medicare Other

## 2020-08-16 ENCOUNTER — Other Ambulatory Visit: Payer: Self-pay | Admitting: Nurse Practitioner

## 2020-08-16 DIAGNOSIS — N186 End stage renal disease: Secondary | ICD-10-CM

## 2020-08-16 DIAGNOSIS — E1151 Type 2 diabetes mellitus with diabetic peripheral angiopathy without gangrene: Secondary | ICD-10-CM

## 2020-08-16 DIAGNOSIS — I10 Essential (primary) hypertension: Secondary | ICD-10-CM

## 2020-08-16 DIAGNOSIS — Z125 Encounter for screening for malignant neoplasm of prostate: Secondary | ICD-10-CM

## 2020-08-16 DIAGNOSIS — R972 Elevated prostate specific antigen [PSA]: Secondary | ICD-10-CM

## 2020-08-16 DIAGNOSIS — E785 Hyperlipidemia, unspecified: Secondary | ICD-10-CM

## 2020-08-16 DIAGNOSIS — Z79899 Other long term (current) drug therapy: Secondary | ICD-10-CM

## 2020-08-18 ENCOUNTER — Other Ambulatory Visit: Payer: Self-pay

## 2020-08-18 ENCOUNTER — Ambulatory Visit: Payer: Medicare Other | Attending: Nurse Practitioner

## 2020-08-18 DIAGNOSIS — N186 End stage renal disease: Secondary | ICD-10-CM

## 2020-08-18 DIAGNOSIS — Z79899 Other long term (current) drug therapy: Secondary | ICD-10-CM

## 2020-08-18 DIAGNOSIS — E1151 Type 2 diabetes mellitus with diabetic peripheral angiopathy without gangrene: Secondary | ICD-10-CM

## 2020-08-18 DIAGNOSIS — I10 Essential (primary) hypertension: Secondary | ICD-10-CM

## 2020-08-18 DIAGNOSIS — E785 Hyperlipidemia, unspecified: Secondary | ICD-10-CM

## 2020-08-18 DIAGNOSIS — Z794 Long term (current) use of insulin: Secondary | ICD-10-CM

## 2020-08-18 DIAGNOSIS — Z125 Encounter for screening for malignant neoplasm of prostate: Secondary | ICD-10-CM

## 2020-08-18 DIAGNOSIS — R972 Elevated prostate specific antigen [PSA]: Secondary | ICD-10-CM

## 2020-08-21 ENCOUNTER — Other Ambulatory Visit: Payer: Self-pay | Admitting: Nurse Practitioner

## 2020-08-21 DIAGNOSIS — R972 Elevated prostate specific antigen [PSA]: Secondary | ICD-10-CM

## 2020-08-21 LAB — CBC
Hematocrit: 41.2 % (ref 37.5–51.0)
Hemoglobin: 13.2 g/dL (ref 13.0–17.7)
MCH: 26.5 pg — ABNORMAL LOW (ref 26.6–33.0)
MCHC: 32 g/dL (ref 31.5–35.7)
MCV: 83 fL (ref 79–97)
Platelets: 227 10*3/uL (ref 150–450)
RBC: 4.99 x10E6/uL (ref 4.14–5.80)
RDW: 13.6 % (ref 11.6–15.4)
WBC: 8.9 10*3/uL (ref 3.4–10.8)

## 2020-08-21 LAB — CMP14+EGFR
ALT: 25 IU/L (ref 0–44)
AST: 17 IU/L (ref 0–40)
Albumin/Globulin Ratio: 1.4 (ref 1.2–2.2)
Albumin: 4.6 g/dL (ref 3.8–4.9)
Alkaline Phosphatase: 100 IU/L (ref 44–121)
BUN/Creatinine Ratio: 6 — ABNORMAL LOW (ref 9–20)
BUN: 29 mg/dL — ABNORMAL HIGH (ref 6–24)
Bilirubin Total: 0.4 mg/dL (ref 0.0–1.2)
CO2: 28 mmol/L (ref 20–29)
Calcium: 8.8 mg/dL (ref 8.7–10.2)
Chloride: 91 mmol/L — ABNORMAL LOW (ref 96–106)
Creatinine, Ser: 4.77 mg/dL — ABNORMAL HIGH (ref 0.76–1.27)
GFR calc Af Amer: 15 mL/min/{1.73_m2} — ABNORMAL LOW (ref >59)
GFR calc non Af Amer: 13 mL/min/{1.73_m2} — ABNORMAL LOW (ref >59)
Globulin, Total: 3.4 g/dL (ref 1.5–4.5)
Glucose: 339 mg/dL — ABNORMAL HIGH (ref 65–99)
Potassium: 4 mmol/L (ref 3.5–5.2)
Sodium: 137 mmol/L (ref 134–144)
Total Protein: 8 g/dL (ref 6.0–8.5)

## 2020-08-21 LAB — LIPID PANEL
Chol/HDL Ratio: 3 ratio (ref 0.0–5.0)
Cholesterol, Total: 180 mg/dL (ref 100–199)
HDL: 60 mg/dL (ref >39)
LDL Chol Calc (NIH): 104 mg/dL — ABNORMAL HIGH (ref 0–99)
Triglycerides: 87 mg/dL (ref 0–149)
VLDL Cholesterol Cal: 16 mg/dL (ref 5–40)

## 2020-08-21 LAB — HEMOGLOBIN A1C
Est. average glucose Bld gHb Est-mCnc: 326 mg/dL
Hgb A1c MFr Bld: 13 % — ABNORMAL HIGH (ref 4.8–5.6)

## 2020-08-21 LAB — PSA: Prostate Specific Ag, Serum: 18.1 ng/mL — ABNORMAL HIGH (ref 0.0–4.0)

## 2020-08-21 LAB — LEVETIRACETAM LEVEL: Levetiracetam Lvl: 6.8 ug/mL — ABNORMAL LOW (ref 10.0–40.0)

## 2020-08-21 MED ORDER — NOVOLOG MIX 70/30 FLEXPEN (70-30) 100 UNIT/ML ~~LOC~~ SUPN: 30.0000 [IU] | PEN_INJECTOR | Freq: Two times a day (BID) | SUBCUTANEOUS | 11 refills | Status: DC

## 2020-08-29 ENCOUNTER — Telehealth: Payer: Self-pay | Admitting: Nurse Practitioner

## 2020-08-29 NOTE — Telephone Encounter (Signed)
Noted  

## 2020-08-29 NOTE — Telephone Encounter (Signed)
Pt states he received letter today regarding results of labs . Labs were also reviewed 08/25/20 by [facility].  Pt states he wanted Zelda to know he is working on diet and exercise and cutting down on sugary drinks. He states he has not missed any meds but takes them in the evening on dialysis days. "Just thought this might make a difference."  Assured pt NT would route to practice for PCPs review.

## 2020-09-12 ENCOUNTER — Ambulatory Visit: Payer: Medicare Other | Admitting: Pharmacist

## 2020-09-19 ENCOUNTER — Ambulatory Visit: Payer: Medicare Other | Admitting: Pharmacist

## 2020-10-12 ENCOUNTER — Other Ambulatory Visit: Payer: Self-pay | Admitting: Nurse Practitioner

## 2020-10-12 DIAGNOSIS — I10 Essential (primary) hypertension: Secondary | ICD-10-CM

## 2020-10-25 ENCOUNTER — Telehealth: Payer: Self-pay

## 2020-10-25 NOTE — Telephone Encounter (Signed)
PA for Ozempic has been approved until 10/25/21

## 2020-10-27 ENCOUNTER — Other Ambulatory Visit: Payer: Self-pay | Admitting: Nurse Practitioner

## 2020-10-27 DIAGNOSIS — I10 Essential (primary) hypertension: Secondary | ICD-10-CM

## 2020-11-14 ENCOUNTER — Ambulatory Visit: Payer: Medicare Other | Admitting: Nurse Practitioner

## 2020-11-25 ENCOUNTER — Other Ambulatory Visit: Payer: Self-pay | Admitting: Nurse Practitioner

## 2020-11-25 DIAGNOSIS — F5101 Primary insomnia: Secondary | ICD-10-CM

## 2020-11-25 NOTE — Telephone Encounter (Signed)
Requested Prescriptions  Pending Prescriptions Disp Refills  . traZODone (DESYREL) 50 MG tablet [Pharmacy Med Name: TRAZODONE 50 MG TABLET] 60 tablet 0    Sig: TAKE 1-2 TABLETS BY MOUTH AT BEDTIME.     Psychiatry: Antidepressants - Serotonin Modulator Passed - 11/25/2020 10:31 AM      Passed - Valid encounter within last 6 months    Recent Outpatient Visits          3 months ago Essential hypertension   [place], [place] W, NP   6 months ago Uncontrolled type 2 diabetes mellitus with diabetic nephropathy, with long-term current use of insulin [facility])   [place], [place] W, NP   8 months ago Uncontrolled type 2 diabetes mellitus with diabetic nephropathy, with long-term current use of insulin [facility])   [place], Vernia Buff, NP   9 months ago [place], Deborah B, MD   12 months ago Essential hypertension   [place], Zelda W, NP      Future Appointments            In 1 week Gildardo Pounds, NP [place]           '

## 2020-12-05 ENCOUNTER — Other Ambulatory Visit: Payer: Self-pay | Admitting: Nurse Practitioner

## 2020-12-05 ENCOUNTER — Other Ambulatory Visit: Payer: Self-pay

## 2020-12-05 ENCOUNTER — Encounter: Payer: Self-pay | Admitting: Nurse Practitioner

## 2020-12-05 ENCOUNTER — Ambulatory Visit: Payer: Medicare Other | Attending: Nurse Practitioner | Admitting: Nurse Practitioner

## 2020-12-05 ENCOUNTER — Telehealth: Payer: Self-pay

## 2020-12-05 VITALS — BP 172/95 | HR 71 | Resp 24 | Wt 325.0 lb

## 2020-12-05 DIAGNOSIS — D688 Other specified coagulation defects: Secondary | ICD-10-CM

## 2020-12-05 DIAGNOSIS — E1151 Type 2 diabetes mellitus with diabetic peripheral angiopathy without gangrene: Secondary | ICD-10-CM

## 2020-12-05 DIAGNOSIS — R972 Elevated prostate specific antigen [PSA]: Secondary | ICD-10-CM

## 2020-12-05 DIAGNOSIS — Z794 Long term (current) use of insulin: Secondary | ICD-10-CM

## 2020-12-05 DIAGNOSIS — Z87898 Personal history of other specified conditions: Secondary | ICD-10-CM

## 2020-12-05 DIAGNOSIS — I10 Essential (primary) hypertension: Secondary | ICD-10-CM | POA: Diagnosis not present

## 2020-12-05 DIAGNOSIS — E782 Mixed hyperlipidemia: Secondary | ICD-10-CM

## 2020-12-05 DIAGNOSIS — E1169 Type 2 diabetes mellitus with other specified complication: Secondary | ICD-10-CM

## 2020-12-05 DIAGNOSIS — F5101 Primary insomnia: Secondary | ICD-10-CM

## 2020-12-05 LAB — POCT GLYCOSYLATED HEMOGLOBIN (HGB A1C): HbA1c, POC (controlled diabetic range): 11.7 % — AB (ref 0.0–7.0)

## 2020-12-05 LAB — GLUCOSE, POCT (MANUAL RESULT ENTRY): POC Glucose: 83 mg/dl (ref 70–99)

## 2020-12-05 MED ORDER — HYDRALAZINE HCL 25 MG PO TABS: 25.0000 mg | ORAL_TABLET | Freq: Three times a day (TID) | ORAL | 1 refills | Status: DC

## 2020-12-05 MED ORDER — ENALAPRIL MALEATE 5 MG PO TABS: 5.0000 mg | ORAL_TABLET | Freq: Every day | ORAL | 1 refills | Status: DC

## 2020-12-05 MED ORDER — CARVEDILOL 6.25 MG PO TABS: 6.2500 mg | ORAL_TABLET | Freq: Two times a day (BID) | ORAL | 1 refills | Status: DC

## 2020-12-05 MED ORDER — ASPIRIN EC 81 MG PO TBEC: 81.0000 mg | DELAYED_RELEASE_TABLET | Freq: Every day | ORAL | 3 refills | Status: DC

## 2020-12-05 MED ORDER — TRAZODONE HCL 150 MG PO TABS: 150.0000 mg | ORAL_TABLET | Freq: Every day | ORAL | 2 refills | Status: DC

## 2020-12-05 MED ORDER — NOVOLOG MIX 70/30 FLEXPEN (70-30) 100 UNIT/ML ~~LOC~~ SUPN: 30.0000 [IU] | PEN_INJECTOR | Freq: Two times a day (BID) | SUBCUTANEOUS | 11 refills | Status: DC

## 2020-12-05 MED ORDER — LEVETIRACETAM ER 500 MG PO TB24: 1000.0000 mg | ORAL_TABLET | Freq: Every day | ORAL | 3 refills | Status: DC

## 2020-12-05 MED ORDER — INSULIN LISPRO PROT & LISPRO (75-25 MIX) 100 UNIT/ML KWIKPEN: 30.0000 [IU] | PEN_INJECTOR | Freq: Two times a day (BID) | SUBCUTANEOUS | 11 refills | Status: DC

## 2020-12-05 MED ORDER — ATORVASTATIN CALCIUM 40 MG PO TABS: 40.0000 mg | ORAL_TABLET | Freq: Every day | ORAL | 3 refills | Status: DC

## 2020-12-05 MED ORDER — BD PEN NEEDLE SHORT U/F 31G X 8 MM MISC: 1.0000 "application " | Freq: Three times a day (TID) | 11 refills | Status: DC

## 2020-12-05 NOTE — Telephone Encounter (Signed)
Patient's insurance prefers the Humalog Mix 75/25 kwikpen.  Can his Novolog Mix be changed?

## 2020-12-05 NOTE — Progress Notes (Signed)
 Assessment & Plan:  Jeremy [name] was seen today for follow-up.  Diagnoses and all orders for this visit:  Type 2 diabetes mellitus with diabetic peripheral angiopathy without gangrene, with long-term current use of insulin (HCC) -     HgB A1c -     Glucose (CBG) -     Ambulatory referral to Ophthalmology -     insulin aspart protamine - aspart (NOVOLOG MIX 70/30 FLEXPEN) (70-30) 100 UNIT/ML FlexPen; Inject 0.3 mLs (30 Units total) into the skin 2 (two) times daily with a meal. -     Insulin Pen Needle (B-D ULTRAFINE III SHORT PEN) 31G X 8 MM MISC; 1 application by Does not apply route 3 (three) times daily. E11.65 -     CMP14+EGFR Continue blood sugar control as discussed in office today, low carbohydrate diet, and regular physical exercise as tolerated, 150 minutes per week (30 min each day, 5 days per week, or 50 min 3 days per week). Keep blood sugar logs with fasting goal of 90-130 mg/dl, post prandial (after you eat) less than 180.  For Hypoglycemia: BS <60 and Hyperglycemia BS >400; contact the clinic ASAP. Annual eye exams and foot exams are recommended.   Primary insomnia -     traZODone (DESYREL) 150 MG tablet; Take 1 tablet (150 mg total) by mouth at bedtime.  Essential hypertension -     hydrALAZINE (APRESOLINE) 25 MG tablet; Take 1 tablet (25 mg total) by mouth 3 (three) times daily. -     enalapril (VASOTEC) 5 MG tablet; Take 1 tablet (5 mg total) by mouth daily. -     carvedilol (COREG) 6.25 MG tablet; Take 1 tablet (6.25 mg total) by mouth 2 (two) times daily with a meal. -     atorvastatin (LIPITOR) 40 MG tablet; Take 1 tablet (40 mg total) by mouth daily. -     CMP14+EGFR Continue all antihypertensives as prescribed.  Remember to bring in your blood pressure log with you for your follow up appointment.  DASH/Mediterranean Diets are healthier choices for HTN.    Mixed hyperlipidemia due to type 2 diabetes mellitus (HCC) -     atorvastatin (LIPITOR) 40 MG tablet; Take 1  tablet (40 mg total) by mouth daily. INSTRUCTIONS: Work on a low fat, heart healthy diet and participate in regular aerobic exercise program by working out at least 150 minutes per week; 5 days a week-30 minutes per day. Avoid red meat/beef/steak,  fried foods. junk foods, sodas, sugary drinks, unhealthy snacking, alcohol and smoking.  Drink at least 80 oz of water per day and monitor your carbohydrate intake daily.    Elevated PSA -     Ambulatory referral to Urology  History of seizure -     levETIRAcetam (KEPPRA XR) 500 MG 24 hr tablet; Take 2 tablets (1,000 mg total) by mouth daily.  Other specified coagulation defects (HCC) -     aspirin EC 81 MG tablet; Take 1 tablet (81 mg total) by mouth daily.    Patient has been counseled on age-appropriate routine health concerns for screening and prevention. These are reviewed and up-to-date. Referrals have been placed accordingly. Immunizations are up-to-date or declined.    Subjective:   Chief Complaint  Patient presents with  . Follow-up   HPI Jeremy [name] 55 y.o. male presents to office today for follow up.  PSA is elevated I have referred him to Urology. It was elevated one year ago 10-2019 however we were never able to reach him   due to no VM being set up on his phone and him not answering calls. He was referred to Urology 07-2020 after a tele visit. Their notes state they were unable to schedule him.  We also sent 2 letters. He was lost to follow up and did not return to the lab for UA.  I saw him again in October 2021 however he left prior to having labs drawn and stated he did not have time for labs. This is his first office visit since then. He states he is unable to give a urine specimen today. He is on HD for ESRD however he is not anuric. I have explained the urgency of his evaluation by Urology. He was given the number to [facility] today to schedule.   Essential  Hypertension Elevated today. He is no longer taking amlodipine  as he states this caused dizziness. He is not taking enalapril but can not recall why he is not taking this medication. Endorses adherence taking coreg 6.25 mg BID, hydralazine 25 mg TID. Will refill enalapril today. Denies chest pain, shortness of breath, palpitations, lightheadedness, dizziness, headaches or BLE edema.  BP Readings from Last 3 Encounters:  12/05/20 (!) 172/95  05/17/20 127/71  04/25/20 (!) 160/91   DM 2 Poorly controlled. He has a Humalog insulin pen here with him today from 08-2019 and never picked up the novolog 70/30 which was prescribed for him several months ago. LDL not at goal. Atorvastatin 40 mg daily refilled today.  Lab Results  Component Value Date   HGBA1C 11.7 (A) 12/05/2020   Lab Results  Component Value Date   LDLCALC 104 (H) 08/18/2020    Insomnia Does not feel 50 mg of Trazodone is effective. He is also drinking caffeine in the evenings and napping during the day. Both of which I instructed him can disrupt a normal nightly sleep cycle.  Review of Systems  Constitutional: Negative for fever, malaise/fatigue and weight loss.  HENT: Negative.  Negative for nosebleeds.   Eyes: Negative.  Negative for blurred vision, double vision and photophobia.  Respiratory: Negative.  Negative for cough and shortness of breath.   Cardiovascular: Negative.  Negative for chest pain, palpitations and leg swelling.  Gastrointestinal: Negative.  Negative for heartburn, nausea and vomiting.  Musculoskeletal: Negative.  Negative for myalgias.  Neurological: Negative.  Negative for dizziness, focal weakness, seizures and headaches.  Psychiatric/Behavioral: Negative for suicidal ideas. The patient has insomnia.     Past Medical History:  Diagnosis Date  . Abscess   . AKI (acute kidney injury) (HCC) 08/05/2017  . Bell's palsy    right eye abn since dx bell's palsy  . CKD (chronic kidney disease)    Dr. McDiarmind   . Dyspnea    occasional with exertion  . Headache   .  High cholesterol   . Hypertension   . Left shoulder pain 10/26/2013   S/p injection on 10/26/13   . Renal insufficiency 02/14/2014  . Seizures (HCC) 08/04/2017   "related to high blood sugars" (08/05/2017)  . Type II diabetes mellitus (HCC)     Past Surgical History:  Procedure Laterality Date  . AV FISTULA PLACEMENT Left 11/11/2019   Procedure: LEFT FOREARM RADIOCEPHALIC ARTERIOVENOUS (AV) FISTULA CREATION;  Surgeon: Clark, Christopher J, MD;  Location[facility];  Service: Vascular;  Laterality: Left;  . Eyelid surgery Right   . I & D EXTREMITY  11/11/2011   Procedure: IRRIGATION AND DEBRIDEMENT EXTREMITY;  Surgeon: Paul S Toth III, MD;    Location: [facility] OR;  Service: General;  Laterality: Right;  I&D Right Thigh Abscess  . IR FLUORO GUIDE CV LINE RIGHT  08/12/2019  . IR US GUIDE VASC ACCESS RIGHT  08/12/2019  . REVISON OF ARTERIOVENOUS FISTULA Left 03/23/2020   Procedure: LEFT ARM ARTERIOVENOUS FISTULA REVISON, LIGATION OF SIDE BRANCHES AND ELEVATION;  Surgeon: Clark, Christopher J, MD;  Location: [facility];  Service: Vascular;  Laterality: Left;    Family History  Problem Relation Age of Onset  . Diabetes Mother   . Heart disease Mother 67  . Diabetes Sister   . Diabetes Brother   . Diabetes Brother   . Cancer Maternal Uncle        type unknown  . Stroke Neg Hx     Social History Reviewed with no changes to be made today.   Outpatient Medications Prior to Visit  Medication Sig Dispense Refill  . VELPHORO 500 MG chewable tablet Chew 500 mg by mouth 3 (three) times daily.    . aspirin EC 81 MG tablet Take 1 tablet (81 mg total) by mouth daily. 90 tablet 3  . hydrALAZINE (APRESOLINE) 25 MG tablet TAKE 1 TABLET BY MOUTH THREE TIMES A DAY 180 tablet 0  . insulin aspart protamine - aspart (NOVOLOG MIX 70/30 FLEXPEN) (70-30) 100 UNIT/ML FlexPen Inject 0.3 mLs (30 Units total) into the skin 2 (two) times daily with a meal. 15 mL 11  . levETIRAcetam (KEPPRA XR) 500 MG 24 hr tablet Take 2 tablets (1,000  mg total) by mouth daily. (Patient taking differently: Take 500 mg by mouth daily.) 180 tablet 3  . traZODone (DESYREL) 50 MG tablet TAKE 1-2 TABLETS BY MOUTH AT BEDTIME. 60 tablet 0  . amLODipine (NORVASC) 10 MG tablet Take 1 tablet (10 mg total) by mouth daily. (Patient not taking: Reported on 12/05/2020) 90 tablet 1  . Blood Glucose Monitoring Suppl (CONTOUR NEXT MONITOR) w/Device KIT 1 Device by Does not apply route 4 (four) times daily - after meals and at bedtime. 1 kit 0  . glucose blood test strip Use as instructed. Inject into the skin 3 times daily E11.65 200 each 12  . Lancet Devices (MICROLET NEXT LANCING DEVICE) MISC 1 Device by Does not apply route 3 (three) times daily. 1 each 11  . lidocaine-prilocaine (EMLA) cream SMARTSIG:Sparingly Topical    . Microlet Lancets MISC 1 Device by Does not apply route 3 (three) times daily. E11.65 200 each 11  . atorvastatin (LIPITOR) 40 MG tablet Take 1 tablet (40 mg total) by mouth daily. 90 tablet 3  . carvedilol (COREG) 6.25 MG tablet Take 1 tablet (6.25 mg total) by mouth 2 (two) times daily with a meal. 180 tablet 1  . enalapril (VASOTEC) 5 MG tablet TAKE 1 TABLET BY MOUTH EVERY DAY (Patient not taking: Reported on 12/05/2020) 90 tablet 0  . Insulin Pen Needle (B-D ULTRAFINE III SHORT PEN) 31G X 8 MM MISC 1 application by Does not apply route 3 (three) times daily. E11.65 200 each 11   No facility-administered medications prior to visit.    Allergies  Allergen Reactions  . Amlodipine Other (See Comments)    Dizziness       Objective:    BP (!) 172/95   Pulse 71   Resp (!) 24   Wt (!) 325 lb (147.4 kg)   SpO2 96%   BMI 49.42 kg/m  Wt Readings from Last 3 Encounters:  12/05/20 (!) 325 lb (147.4 kg)  05/17/20 (!) 320   lb (145.2 kg)  04/25/20 (!) 316 lb (143.3 kg)    Physical Exam Vitals and nursing note reviewed.  Constitutional:      Appearance: He is well-developed.  HENT:     Head: Normocephalic and atraumatic.   Cardiovascular:     Rate and Rhythm: Normal rate and regular rhythm.     Heart sounds: Normal heart sounds. No murmur heard. No friction rub. No gallop.   Pulmonary:     Effort: Pulmonary effort is normal. Tachypnea present. No respiratory distress.     Breath sounds: Normal breath sounds. No decreased breath sounds, wheezing, rhonchi or rales.  Chest:     Chest wall: No tenderness.  Abdominal:     General: Bowel sounds are normal.     Palpations: Abdomen is soft.  Musculoskeletal:        General: Normal range of motion.     Cervical back: Normal range of motion.  Skin:    General: Skin is warm and dry.  Neurological:     Mental Status: He is alert and oriented to person, place, and time.     Coordination: Coordination normal.  Psychiatric:        Behavior: Behavior normal. Behavior is cooperative.        Thought Content: Thought content normal.        Judgment: Judgment normal.          Patient has been counseled extensively about nutrition and exercise as well as the importance of adherence with medications and regular follow-up. The patient was given clear instructions to go to ER or return to medical center if symptoms don't improve, worsen or new problems develop. The patient verbalized understanding.   Follow-up: Return in about 3 months (around 03/07/2021).   Zelda W Fleming, FNP-BC [facility] [place], [place] [phone]   12/05/2020, 7:22 PM 

## 2020-12-05 NOTE — Progress Notes (Signed)
3 month f/u  Concerns with sweating with little to no activity at all.   Gets a chill in chest and  Arms when he is sweaty- Sx's greater than 1 month.

## 2020-12-05 NOTE — Telephone Encounter (Signed)
SURE NP!! Thanks!!

## 2020-12-06 ENCOUNTER — Other Ambulatory Visit: Payer: Self-pay | Admitting: Nurse Practitioner

## 2020-12-06 LAB — CMP14+EGFR
ALT: 15 IU/L (ref 0–44)
AST: 11 IU/L (ref 0–40)
Albumin/Globulin Ratio: 1.6 (ref 1.2–2.2)
Albumin: 4.8 g/dL (ref 3.8–4.9)
Alkaline Phosphatase: 76 IU/L (ref 44–121)
BUN/Creatinine Ratio: 9 (ref 9–20)
BUN: 66 mg/dL — ABNORMAL HIGH (ref 6–24)
Bilirubin Total: 0.3 mg/dL (ref 0.0–1.2)
CO2: 22 mmol/L (ref 20–29)
Calcium: 10.2 mg/dL (ref 8.7–10.2)
Chloride: 100 mmol/L (ref 96–106)
Creatinine, Ser: 7.49 mg/dL — ABNORMAL HIGH (ref 0.76–1.27)
Globulin, Total: 3 g/dL (ref 1.5–4.5)
Glucose: 83 mg/dL (ref 65–99)
Potassium: 4.2 mmol/L (ref 3.5–5.2)
Sodium: 142 mmol/L (ref 134–144)
Total Protein: 7.8 g/dL (ref 6.0–8.5)
eGFR: 8 mL/min/{1.73_m2} — ABNORMAL LOW (ref >59)

## 2020-12-06 NOTE — Telephone Encounter (Signed)
  Notes to clinic:  Alternative Requested:INSURANCE PERFERS NOVOLOG MIX   Requested Prescriptions  Pending Prescriptions Disp Refills   NOVOLOG MIX 70/30 (70-30) 100 UNIT/ML injection [Pharmacy Med Name: NOVOLOG MIX 70-30 [place]  0      Endocrinology:  Diabetes - Insulins Failed - 12/06/2020 11:39 AM      Failed - HBA1C is between 0 and 7.9 and within 180 days    HbA1c, POC (controlled diabetic range)  Date Value Ref Range Status  12/05/2020 11.7 (A) 0.0 - 7.0 % Final          Passed - Valid encounter within last 6 months    Recent Outpatient Visits           Yesterday Type 2 diabetes mellitus with diabetic peripheral angiopathy without gangrene, with long-term current use of insulin [facility])   [place], Vernia Buff, NP   3 months ago Essential hypertension   [place], [place] W, NP   6 months ago Uncontrolled type 2 diabetes mellitus with diabetic nephropathy, with long-term current use of insulin [facility])   [place], [place] W, NP   9 months ago Uncontrolled type 2 diabetes mellitus with diabetic nephropathy, with long-term current use of insulin [facility])   [place], Vernia Buff, NP   9 months ago Dizziness   [facility] [place], MD

## 2020-12-07 MED ORDER — NOVOLOG MIX 70/30 FLEXPEN (70-30) 100 UNIT/ML ~~LOC~~ SUPN: 30.0000 [IU] | PEN_INJECTOR | Freq: Two times a day (BID) | SUBCUTANEOUS | 2 refills | Status: DC

## 2020-12-21 ENCOUNTER — Telehealth: Payer: Self-pay | Admitting: Nurse Practitioner

## 2020-12-21 NOTE — Telephone Encounter (Signed)
Copied from [place] [id number]. Topic: General - Other >> Dec 19, 2020 11:38 AM Leward Quan A wrote: Reason for CRM: Patient would like a call back from nurse to discuss lab results from 12/05/20.  Ph# [phone]  I see where [facility] said that they discussed lab results at the office visit but patient calling back wanting to discuss. Please advise.

## 2020-12-21 NOTE — Telephone Encounter (Signed)
Returned pt call and provided labs.  Pt states he is needing to see a specialist. Pt states he sweats a lot. Pt states when he starts to eat he starts to sweat. Pt states is know that is not normal. Pt is wanting to know what could be the cause.

## 2020-12-22 ENCOUNTER — Other Ambulatory Visit: Payer: Self-pay | Admitting: Nurse Practitioner

## 2020-12-22 DIAGNOSIS — R61 Generalized hyperhidrosis: Secondary | ICD-10-CM

## 2020-12-22 NOTE — Telephone Encounter (Signed)
Referral placed.

## 2020-12-27 ENCOUNTER — Other Ambulatory Visit: Payer: Self-pay

## 2021-01-01 ENCOUNTER — Other Ambulatory Visit: Payer: Self-pay

## 2021-01-01 ENCOUNTER — Encounter: Payer: Self-pay | Admitting: Nurse Practitioner

## 2021-01-01 ENCOUNTER — Ambulatory Visit: Payer: Managed Care, Other (non HMO) | Attending: Nurse Practitioner | Admitting: Nurse Practitioner

## 2021-01-01 VITALS — BP 144/97 | HR 98 | Resp 20 | Ht 68.0 in | Wt 312.0 lb

## 2021-01-01 DIAGNOSIS — F5101 Primary insomnia: Secondary | ICD-10-CM

## 2021-01-01 DIAGNOSIS — Z888 Allergy status to other drugs, medicaments and biological substances status: Secondary | ICD-10-CM | POA: Insufficient documentation

## 2021-01-01 DIAGNOSIS — G4733 Obstructive sleep apnea (adult) (pediatric): Secondary | ICD-10-CM

## 2021-01-01 DIAGNOSIS — Z8249 Family history of ischemic heart disease and other diseases of the circulatory system: Secondary | ICD-10-CM | POA: Diagnosis not present

## 2021-01-01 DIAGNOSIS — Z794 Long term (current) use of insulin: Secondary | ICD-10-CM

## 2021-01-01 DIAGNOSIS — Z7982 Long term (current) use of aspirin: Secondary | ICD-10-CM | POA: Diagnosis not present

## 2021-01-01 DIAGNOSIS — E1151 Type 2 diabetes mellitus with diabetic peripheral angiopathy without gangrene: Secondary | ICD-10-CM | POA: Diagnosis not present

## 2021-01-01 DIAGNOSIS — Z79899 Other long term (current) drug therapy: Secondary | ICD-10-CM | POA: Insufficient documentation

## 2021-01-01 DIAGNOSIS — J3489 Other specified disorders of nose and nasal sinuses: Secondary | ICD-10-CM | POA: Insufficient documentation

## 2021-01-01 DIAGNOSIS — Z833 Family history of diabetes mellitus: Secondary | ICD-10-CM | POA: Insufficient documentation

## 2021-01-01 DIAGNOSIS — R053 Chronic cough: Secondary | ICD-10-CM | POA: Diagnosis present

## 2021-01-01 LAB — GLUCOSE, POCT (MANUAL RESULT ENTRY): POC Glucose: 283 mg/dl — AB (ref 70–99)

## 2021-01-01 MED ORDER — FAMOTIDINE 10 MG PO TABS: 10.0000 mg | ORAL_TABLET | Freq: Two times a day (BID) | ORAL | 1 refills | Status: DC

## 2021-01-01 NOTE — Progress Notes (Signed)
Assessment & Plan:  Jeremy [name] was seen today for cough.  Diagnoses and all orders for this visit:  Chronic cough -     famotidine (PEPCID) 10 MG tablet; Take 1 tablet (10 mg total) by mouth 2 (two) times daily.  Type 2 diabetes mellitus with diabetic peripheral angiopathy without gangrene, with long-term current use of insulin (HCC) -     Glucose (CBG)  Primary insomnia -     Split night study; Future  OSA (obstructive sleep apnea) -     Split night study; Future   Patient has been counseled on age-appropriate routine health concerns for screening and prevention. These are reviewed and up-to-date. Referrals have been placed accordingly. Immunizations are up-to-date or declined.    Subjective:   Chief Complaint  Patient presents with   Cough   HPI Jeremy [name] 55 y.o. male presents to office today with complaints of chronic cough and insomnia. He still has not followed up with Urology regarding his elevated PSA. He is aware of the urgency of this. I have given him the number to [facility]   Cough Patient complains of nonproductive coughing spells.   Symptoms began 1 month ago.  The cough is non-productive, without wheezing, dyspnea or hemoptysis and is aggravated by nothing . There are no Associated symptoms. Patient does not have new pets. Patient does not have a history of asthma. Patient does not have a history of environmental allergens. Patient did not have recent travel. Patient does not have a history of smoking. Patient  had previous Chest X-ray and has a history of COVID. Patient did not have have a PPD done.    Insomnia Currently taking trazodone 150 mg with no relief of insomnia. Will increase to 300 mg daily and follow up in 2-3 weeks.     Sleep Apnea Patient presents with possible obstructive sleep apnea. Patent has  symptoms of daytime fatigue, morning fatigue, tonsil enlargement, nasal obstruction, and hypertension. Patient generally gets a few hours of  sleep per night, and states they generally have nightime awakenings and difficulty falling back asleep if awakened. Snoring of moderate severity is present. Apneic episodes are present. Nasal obstruction is present.  Patient has not had tonsillectomy.    ROS  Past Medical History:  Diagnosis Date   Abscess    AKI (acute kidney injury) ([place]) 08/05/2017   Bell's palsy    right eye abn since dx bell's palsy   CKD (chronic kidney disease)    Dr. McDiarmind    Dyspnea    occasional with exertion   Headache    High cholesterol    Hypertension    Left shoulder pain 10/26/2013   S/p injection on 10/26/13    Renal insufficiency 02/14/2014   Seizures ([place]) 08/04/2017   "related to high blood sugars" (08/05/2017)   Type II diabetes mellitus ([place])     Past Surgical History:  Procedure Laterality Date   AV FISTULA PLACEMENT Left 11/11/2019   Procedure: LEFT FOREARM RADIOCEPHALIC ARTERIOVENOUS (AV) FISTULA CREATION;  Surgeon: [name], MD;  Location: [place];  Service: Vascular;  Laterality: Left;   Eyelid surgery Right    I & D EXTREMITY  11/11/2011   Procedure: IRRIGATION AND DEBRIDEMENT EXTREMITY;  Surgeon: Merrie Roof, MD;  Location: [place];  Service: General;  Laterality: Right;  I&D Right Thigh Abscess   IR FLUORO GUIDE CV LINE RIGHT  08/12/2019   IR US GUIDE VASC ACCESS RIGHT  08/12/2019   REVISON  OF ARTERIOVENOUS FISTULA Left 03/23/2020   Procedure: LEFT ARM ARTERIOVENOUS FISTULA REVISON, LIGATION OF SIDE BRANCHES AND ELEVATION;  Surgeon: [name], MD;  [facility];  Service: Vascular;  Laterality: Left;    Family History  Problem Relation Age of Onset   Diabetes Mother    Heart disease Mother 40   Diabetes Sister    Diabetes Brother    Diabetes Brother    Cancer Maternal Uncle        type unknown   Stroke Neg Hx     Social History Reviewed with no changes to be made today.   Outpatient Medications Prior to Visit  Medication Sig Dispense Refill    amLODipine (NORVASC) 10 MG tablet Take 1 tablet (10 mg total) by mouth daily. 90 tablet 1   aspirin EC 81 MG tablet Take 1 tablet (81 mg total) by mouth daily. 90 tablet 3   atorvastatin (LIPITOR) 40 MG tablet Take 1 tablet (40 mg total) by mouth daily. 90 tablet 3   Blood Glucose Monitoring Suppl (CONTOUR NEXT MONITOR) w/Device KIT 1 Device by Does not apply route 4 (four) times daily - after meals and at bedtime. 1 kit 0   carvedilol (COREG) 6.25 MG tablet Take 1 tablet (6.25 mg total) by mouth 2 (two) times daily with a meal. 180 tablet 1   enalapril (VASOTEC) 5 MG tablet Take 1 tablet (5 mg total) by mouth daily. 90 tablet 1   glucose blood test strip Use as instructed. Inject into the skin 3 times daily E11.65 200 each 12   hydrALAZINE (APRESOLINE) 25 MG tablet Take 1 tablet (25 mg total) by mouth 3 (three) times daily. 180 tablet 1   insulin aspart protamine - aspart (NOVOLOG MIX 70/30 FLEXPEN) (70-30) 100 UNIT/ML FlexPen Inject 0.3 mLs (30 Units total) into the skin 2 (two) times daily. 15 mL 2   Insulin Pen Needle (B-D ULTRAFINE III SHORT PEN) 31G X 8 MM MISC 1 application by Does not apply route 3 (three) times daily. E11.65 200 each 11   Lancet Devices (MICROLET NEXT LANCING DEVICE) MISC 1 Device by Does not apply route 3 (three) times daily. 1 each 11   levETIRAcetam (KEPPRA XR) 500 MG 24 hr tablet Take 2 tablets (1,000 mg total) by mouth daily. 180 tablet 3   lidocaine-prilocaine (EMLA) cream SMARTSIG:Sparingly Topical     Microlet Lancets MISC 1 Device by Does not apply route 3 (three) times daily. E11.65 200 each 11   traZODone (DESYREL) 150 MG tablet Take 1 tablet (150 mg total) by mouth at bedtime. 90 tablet 2   VELPHORO 500 MG chewable tablet Chew 500 mg by mouth 3 (three) times daily.     No facility-administered medications prior to visit.    Allergies  Allergen Reactions   Amlodipine Other (See Comments)    Dizziness       Objective:    BP (!) 144/97   Pulse 98    Resp 20   Ht _0  (1.727 m)   Wt (!) 312 lb (141.5 kg)   SpO2 95%   BMI 47.44 kg/m  Wt Readings from Last 3 Encounters:  01/01/21 (!) 312 lb (141.5 kg)  12/05/20 (!) 325 lb (147.4 kg)  05/17/20 (!) 320 lb (145.2 kg)    Physical Exam       Patient has been counseled extensively about nutrition and exercise as well as the importance of adherence with medications and regular follow-up. The patient was given  clear instructions to go to ER or return to medical center if symptoms don't improve, worsen or new problems develop. The patient verbalized understanding.   Follow-up: Return in about 2 weeks (around 01/15/2021) for COUGH FOLLOW UP 1:15 TELE slot. 2 weeks. THEN  see me in 3 months.   Gildardo Pounds, FNP-BC [facility] [place], [place]   01/01/2021, 12:43 PM

## 2021-01-01 NOTE — Progress Notes (Signed)
Cough x 1 month  Discuss anxiety

## 2021-01-02 ENCOUNTER — Telehealth: Payer: Self-pay

## 2021-01-02 ENCOUNTER — Other Ambulatory Visit: Payer: Self-pay | Admitting: Nurse Practitioner

## 2021-01-02 MED ORDER — INSULIN LISPRO PROT & LISPRO (75-25 MIX) 100 UNIT/ML KWIKPEN: 30.0000 [IU] | PEN_INJECTOR | Freq: Two times a day (BID) | SUBCUTANEOUS | 1 refills | Status: DC

## 2021-01-02 NOTE — Telephone Encounter (Signed)
PA for Novolog Mix 70/30 flexpen has been denied-medical criteria not met.  Insurance prefers Humalog Mix 75/25

## 2021-01-15 ENCOUNTER — Other Ambulatory Visit: Payer: Self-pay

## 2021-01-15 ENCOUNTER — Encounter: Payer: Self-pay | Admitting: Nurse Practitioner

## 2021-01-15 ENCOUNTER — Ambulatory Visit: Payer: Medicare Other | Attending: Nurse Practitioner | Admitting: Nurse Practitioner

## 2021-01-15 ENCOUNTER — Telehealth: Payer: Self-pay | Admitting: Nurse Practitioner

## 2021-01-15 DIAGNOSIS — R053 Chronic cough: Secondary | ICD-10-CM | POA: Diagnosis not present

## 2021-01-15 NOTE — Progress Notes (Addendum)
Virtual Visit via Telephone Note Due to national recommendations of social distancing due to [place] 19, telehealth visit is felt to be most appropriate for this patient at this time.  I discussed the limitations, risks, security and privacy concerns of performing an evaluation and management service by telephone and the availability of in person appointments. I also discussed with the patient that there may be a patient responsible charge related to this service. The patient expressed understanding and agreed to proceed.    I connected with Jeremy [name] on 01/15/21  at   1:30 PM EDT  EDT by telephone and verified that I am speaking with the correct person using two identifiers.  Location of Patient: Private Residence   Location of Provider: [place] and [place] participating in Telemedicine visit: Jeremy Rankins FNP-BC Jeremy [name]    History of Present Illness: Telemedicine visit for: Cough  He was started on famotidine 20 mg twice daily on 01/01/2021 for chronic nonproductive cough.  Today he states the cough has improved on its own and when he was taking the famotidine it seemed that his cough had worsened.  As of now no further treatment as he states his cough has significantly improved.  If cough does return will discontinue enalapril and treat blood pressure accordingly.  Past Medical History:  Diagnosis Date   Abscess    AKI (acute kidney injury) ([place]) 08/05/2017   Bell's palsy    right eye abn since dx bell's palsy   CKD (chronic kidney disease)    Dr. McDiarmind    Dyspnea    occasional with exertion   Headache    High cholesterol    Hypertension    Left shoulder pain 10/26/2013   S/p injection on 10/26/13    Renal insufficiency 02/14/2014   Seizures ([place]) 08/04/2017   "related to high blood sugars" (08/05/2017)   Type II diabetes mellitus ([place])     Past Surgical History:  Procedure Laterality Date   AV FISTULA PLACEMENT Left 11/11/2019    Procedure: LEFT FOREARM RADIOCEPHALIC ARTERIOVENOUS (AV) FISTULA CREATION;  Surgeon: [name], MD;  [facility];  Service: Vascular;  Laterality: Left;   Eyelid surgery Right    I & D EXTREMITY  11/11/2011   Procedure: IRRIGATION AND DEBRIDEMENT EXTREMITY;  Surgeon: Merrie Roof, MD;  Location: [facility];  Service: General;  Laterality: Right;  I&D Right Thigh Abscess   IR FLUORO GUIDE CV LINE RIGHT  08/12/2019   IR US GUIDE VASC ACCESS RIGHT  08/12/2019   REVISON OF ARTERIOVENOUS FISTULA Left 03/23/2020   Procedure: LEFT ARM ARTERIOVENOUS FISTULA REVISON, LIGATION OF SIDE BRANCHES AND ELEVATION;  Surgeon: [name], MD;  [facility];  Service: Vascular;  Laterality: Left;    Family History  Problem Relation Age of Onset   Diabetes Mother    Heart disease Mother 68   Diabetes Sister    Diabetes Brother    Diabetes Brother    Cancer Maternal Uncle        type unknown   Stroke Neg Hx     Social History   Socioeconomic History   Marital status: Divorced    Spouse name: Not on file   Number of children: 3   Years of education: Not on file   Highest education level: Not on file  Occupational History   Occupation: maintenance tech  Tobacco Use   Smoking status: Never   Smokeless tobacco: Never  Vaping  Use   Vaping Use: Never used  Substance and Sexual Activity   Alcohol use: Not Currently   Drug use: No   Sexual activity: Yes  Other Topics Concern   Not on file  Social History Narrative   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  Did not graduate high school.  Has 3 adult children in [place].  Divorced.  Lives with girlfriend Jeremy [name]).                   Social Determinants of Health   Financial Resource Strain: Not on file  Food Insecurity: Not on file  Transportation Needs: Not on file  Physical Activity: Not on file  Stress: Not on file  Social Connections: Not on file      Observations/Objective: Awake, alert and oriented x 3   Review of Systems  Constitutional:  Negative for fever, malaise/fatigue and weight loss.  HENT: Negative.  Negative for nosebleeds.   Eyes: Negative.  Negative for blurred vision, double vision and photophobia.  Respiratory: Negative.  Negative for cough and shortness of breath.   Cardiovascular: Negative.  Negative for chest pain, palpitations and leg swelling.  Gastrointestinal: Negative.  Negative for heartburn, nausea and vomiting.  Musculoskeletal: Negative.  Negative for myalgias.  Neurological: Negative.  Negative for dizziness, focal weakness, seizures and headaches.  Psychiatric/Behavioral: Negative.  Negative for suicidal ideas.    Assessment and Plan: Diagnoses and all orders for this visit:  Chronic cough Improved  Follow Up Instructions Return in about 3 months (around 04/17/2021).     I discussed the assessment and treatment plan with the patient. The patient was provided an opportunity to ask questions and all were answered. The patient agreed with the plan and demonstrated an understanding of the instructions.   The patient was advised to call back or seek an in-person evaluation if the symptoms worsen or if the condition fails to improve as anticipated.  I provided 8 minutes of non-face-to-face time during this encounter including median intraservice time, reviewing previous notes, labs, imaging, medications and explaining diagnosis and management.  Gildardo Pounds, FNP-BC

## 2021-01-15 NOTE — Telephone Encounter (Signed)
Unable to reach. VM not set up on [phone]

## 2021-02-07 ENCOUNTER — Ambulatory Visit: Payer: Medicare Other | Admitting: Adult Health

## 2021-02-07 ENCOUNTER — Ambulatory Visit (INDEPENDENT_AMBULATORY_CARE_PROVIDER_SITE_OTHER): Payer: Medicare Other | Admitting: Adult Health

## 2021-02-07 ENCOUNTER — Other Ambulatory Visit: Payer: Self-pay

## 2021-02-07 ENCOUNTER — Encounter: Payer: Self-pay | Admitting: Adult Health

## 2021-02-07 VITALS — BP 125/76 | HR 76 | Ht 68.0 in | Wt 320.0 lb

## 2021-02-07 DIAGNOSIS — R569 Unspecified convulsions: Secondary | ICD-10-CM

## 2021-02-07 NOTE — Patient Instructions (Signed)
Continue Keppra ER 1000 mg daily If you have any seizure events please let us know.

## 2021-02-07 NOTE — Progress Notes (Addendum)
PATIENT: Jeremy [name] DOB: July 03, 1966  REASON FOR VISIT: follow up HISTORY FROM: patient Primary neurologist: Dr. Felecia Shelling  HISTORY OF PRESENT ILLNESS: Today 02/07/21:  Mr. Opdahl is a 55 year old male with a history of seizures.  He returns today for follow-up.  He is on Keppra extended release 1000 mg daily.  He denies any seizure events.  He continues to operate a motor vehicle without difficulty.  Able to complete all ADLs independently.  Patient reports that he is on disability due to dialysis.  So far he has not had an issue with seizures after dialysis.  He returns today for an evaluation.    02/07/20: Mr. Petrovich is a 55 year old male with a history of seizures.  He returns today for follow-up.  He is on Keppra extended release.  His prescription was written for 750 mg 2 tablets daily.  However he states he has been breaking the tablets in half and taking a half a tablet in the morning and a half a tablet in the evening.  He states that the full dose makes him dizzy.  He denies any seizure events.  He operates a Teacher, music without difficulty.  He returns today for an evaluation.  HISTORY 02/04/19:   Mr. Boivin is a 55 year old male with a history of seizures.  He returns today for follow-up.  He is currently on Keppra however he is unsure if he is on the extended release or the immediate release.  We will call his pharmacy to verify.  He reports he has not had any seizures.  He operates a Teacher, music without difficulty.  Denies any changes in his gait or balance.  No change in his mood or behavior.  He returns today for follow-up.  REVIEW OF SYSTEMS: Out of a complete 14 system review of symptoms, the patient complains only of the following symptoms, and all other reviewed systems are negative.  See HPI  ALLERGIES: Allergies  Allergen Reactions   Amlodipine Other (See Comments)    Dizziness    HOME MEDICATIONS: Outpatient Medications Prior to Visit  Medication Sig  Dispense Refill   amLODipine (NORVASC) 10 MG tablet Take 1 tablet (10 mg total) by mouth daily. 90 tablet 1   aspirin EC 81 MG tablet Take 1 tablet (81 mg total) by mouth daily. 90 tablet 3   atorvastatin (LIPITOR) 40 MG tablet Take 1 tablet (40 mg total) by mouth daily. 90 tablet 3   Blood Glucose Monitoring Suppl (CONTOUR NEXT MONITOR) w/Device KIT 1 Device by Does not apply route 4 (four) times daily - after meals and at bedtime. 1 kit 0   carvedilol (COREG) 6.25 MG tablet Take 1 tablet (6.25 mg total) by mouth 2 (two) times daily with a meal. 180 tablet 1   enalapril (VASOTEC) 5 MG tablet Take 1 tablet (5 mg total) by mouth daily. 90 tablet 1   famotidine (PEPCID) 10 MG tablet Take 1 tablet (10 mg total) by mouth 2 (two) times daily. 60 tablet 1   glucose blood test strip Use as instructed. Inject into the skin 3 times daily E11.65 200 each 12   hydrALAZINE (APRESOLINE) 25 MG tablet Take 1 tablet (25 mg total) by mouth 3 (three) times daily. 180 tablet 1   Insulin Lispro Prot & Lispro (HUMALOG MIX 75/25 KWIKPEN) (75-25) 100 UNIT/ML Kwikpen Inject 30 Units into the skin 2 (two) times daily. 54 mL 1   Insulin Pen Needle (B-D ULTRAFINE III SHORT PEN) 31G X 8 MM  MISC 1 application by Does not apply route 3 (three) times daily. E11.65 200 each 11   Lancet Devices (MICROLET NEXT LANCING DEVICE) MISC 1 Device by Does not apply route 3 (three) times daily. 1 each 11   levETIRAcetam (KEPPRA XR) 500 MG 24 hr tablet Take 2 tablets (1,000 mg total) by mouth daily. 180 tablet 3   lidocaine-prilocaine (EMLA) cream SMARTSIG:Sparingly Topical     Microlet Lancets MISC 1 Device by Does not apply route 3 (three) times daily. E11.65 200 each 11   traZODone (DESYREL) 150 MG tablet Take 1 tablet (150 mg total) by mouth at bedtime. 90 tablet 2   VELPHORO 500 MG chewable tablet Chew 500 mg by mouth 3 (three) times daily.     No facility-administered medications prior to visit.    PAST MEDICAL HISTORY: Past  Medical History:  Diagnosis Date   Abscess    AKI (acute kidney injury) ([place]) 08/05/2017   Bell's palsy    right eye abn since dx bell's palsy   CKD (chronic kidney disease)    Dr. McDiarmind    Dyspnea    occasional with exertion   Headache    High cholesterol    Hypertension    Left shoulder pain 10/26/2013   S/p injection on 10/26/13    Renal insufficiency 02/14/2014   Seizures ([place]) 08/04/2017   "related to high blood sugars" (08/05/2017)   Type II diabetes mellitus ([place])     PAST SURGICAL HISTORY: Past Surgical History:  Procedure Laterality Date   AV FISTULA PLACEMENT Left 11/11/2019   Procedure: LEFT FOREARM RADIOCEPHALIC ARTERIOVENOUS (AV) FISTULA CREATION;  Surgeon: [name], MD;  [facility];  Service: Vascular;  Laterality: Left;   Eyelid surgery Right    I & D EXTREMITY  11/11/2011   Procedure: IRRIGATION AND DEBRIDEMENT EXTREMITY;  Surgeon: Merrie Roof, MD;  Location: [facility];  Service: General;  Laterality: Right;  I&D Right Thigh Abscess   IR FLUORO GUIDE CV LINE RIGHT  08/12/2019   IR US GUIDE VASC ACCESS RIGHT  08/12/2019   REVISON OF ARTERIOVENOUS FISTULA Left 03/23/2020   Procedure: LEFT ARM ARTERIOVENOUS FISTULA REVISON, LIGATION OF SIDE BRANCHES AND ELEVATION;  Surgeon: [name], MD;  [facility];  Service: Vascular;  Laterality: Left;    FAMILY HISTORY: Family History  Problem Relation Age of Onset   Diabetes Mother    Heart disease Mother 44   Diabetes Sister    Diabetes Brother    Diabetes Brother    Cancer Maternal Uncle        type unknown   Stroke Neg Hx     SOCIAL HISTORY: Social History   Socioeconomic History   Marital status: Divorced    Spouse name: Not on file   Number of children: 3   Years of education: Not on file   Highest education level: Not on file  Occupational History   Occupation: maintenance tech  Tobacco Use   Smoking status: Never   Smokeless tobacco: Never  Vaping Use   Vaping Use:  Never used  Substance and Sexual Activity   Alcohol use: Not Currently   Drug use: No   Sexual activity: Yes  Other Topics Concern   Not on file  Social History Narrative   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  Did not graduate high school.  Has 3 adult children in [place].  Divorced.  Lives with girlfriend [name].  Social Determinants of Health   Financial Resource Strain: Not on file  Food Insecurity: Not on file  Transportation Needs: Not on file  Physical Activity: Not on file  Stress: Not on file  Social Connections: Not on file  Intimate Partner Violence: Not on file      PHYSICAL EXAM  Vitals:   02/07/21 0849  Weight: (!) 320 lb (145.2 kg)  Height: $Remove'5\' 8"'xTfTTfO$  (1.727 m)   Body mass index is 48.66 kg/m.  Generalized: Well developed, in no acute distress   Neurological examination  Mentation: Alert oriented to time, place, history taking. Follows all commands speech and language fluent Cranial nerve II-XII: Pupils were equal round reactive to light. Extraocular movements were full, visual field were full on confrontational test.Head turning and shoulder shrug  were normal and symmetric. Motor: The motor testing reveals 5 over 5 strength of all 4 extremities. Good symmetric motor tone is noted throughout.  Sensory: Sensory testing is intact to soft touch on all 4 extremities. No evidence of extinction is noted.  Coordination: Cerebellar testing reveals good finger-nose-finger and heel-to-shin bilaterally.  Gait and station: Gait is normal.  Reflexes: Deep tendon reflexes are symmetric and normal bilaterally.   DIAGNOSTIC DATA (LABS, IMAGING, TESTING) - I reviewed patient records, labs, notes, testing and imaging myself where available.  Lab Results  Component Value Date   WBC 8.9 08/18/2020   HGB 13.2 08/18/2020   HCT 41.2 08/18/2020   MCV 83 08/18/2020   PLT 227 08/18/2020      Component Value  Date/Time   NA 142 12/05/2020 1604   K 4.2 12/05/2020 1604   CL 100 12/05/2020 1604   CO2 22 12/05/2020 1604   GLUCOSE 83 12/05/2020 1604   GLUCOSE 300 (H) 03/23/2020 1004   BUN 66 (H) 12/05/2020 1604   CREATININE 7.49 (H) 12/05/2020 1604   CREATININE 2.77 (H) 05/31/2016 0844   CALCIUM 10.2 12/05/2020 1604   PROT 7.8 12/05/2020 1604   ALBUMIN 4.8 12/05/2020 1604   AST 11 12/05/2020 1604   ALT 15 12/05/2020 1604   ALKPHOS 76 12/05/2020 1604   BILITOT 0.3 12/05/2020 1604   GFRNONAA 13 (L) 08/18/2020 0925   GFRNONAA 26 (L) 05/31/2016 0844   GFRAA 15 (L) 08/18/2020 0925   GFRAA 30 (L) 05/31/2016 0844   Lab Results  Component Value Date   CHOL 180 08/18/2020   HDL 60 08/18/2020   LDLCALC 104 (H) 08/18/2020   TRIG 87 08/18/2020   CHOLHDL 3.0 08/18/2020   Lab Results  Component Value Date   HGBA1C 11.7 (A) 12/05/2020   Lab Results  Component Value Date   VITAMINB12 412 11/26/2013   Lab Results  Component Value Date   TSH 1.740 09/23/2018      ASSESSMENT AND PLAN 55 y.o. year old male  has a past medical history of Abscess, AKI (acute kidney injury) ([place]) (08/05/2017), Bell's palsy, CKD (chronic kidney disease), Dyspnea, Headache, High cholesterol, Hypertension, Left shoulder pain (10/26/2013), Renal insufficiency (02/14/2014), Seizures ([place]) (08/04/2017), and Type II diabetes mellitus ([place]). here with :  Seizures   Continue Keppra 500 mg extended release-2 tablets daily for total of 1000 mg daily Advised patient if he has any seizure events he should let us know Follow-up in 1 year or sooner if needed     Ward Givens, MSN, NP-C 02/07/2021, 8:49 AM [facility] [address] [place], [place] [address] [phone]    I have read the note, and I agree  with the clinical assessment and plan.  Richard A. Felecia Shelling, MD, PhD, [facility] Certified in Neurology, Clinical Neurophysiology, Sleep Medicine, Pain Medicine and Neuroimaging  [facility] [address], [place], [place] [address] [phone]

## 2021-02-23 ENCOUNTER — Emergency Department (HOSPITAL_BASED_OUTPATIENT_CLINIC_OR_DEPARTMENT_OTHER)
Admission: EM | Admit: 2021-02-23 | Discharge: 2021-02-23 | Disposition: A | Discharge status: Home / Self Care | Payer: Medicare Other | Attending: Emergency Medicine | Admitting: Emergency Medicine

## 2021-02-23 ENCOUNTER — Emergency Department (HOSPITAL_BASED_OUTPATIENT_CLINIC_OR_DEPARTMENT_OTHER): Payer: Medicare Other | Admitting: Radiology

## 2021-02-23 ENCOUNTER — Other Ambulatory Visit: Payer: Self-pay

## 2021-02-23 ENCOUNTER — Emergency Department (HOSPITAL_BASED_OUTPATIENT_CLINIC_OR_DEPARTMENT_OTHER): Payer: Medicare Other

## 2021-02-23 ENCOUNTER — Encounter (HOSPITAL_BASED_OUTPATIENT_CLINIC_OR_DEPARTMENT_OTHER): Payer: Self-pay

## 2021-02-23 DIAGNOSIS — N186 End stage renal disease: Secondary | ICD-10-CM | POA: Insufficient documentation

## 2021-02-23 DIAGNOSIS — Z8616 Personal history of COVID-19: Secondary | ICD-10-CM | POA: Insufficient documentation

## 2021-02-23 DIAGNOSIS — R1011 Right upper quadrant pain: Secondary | ICD-10-CM | POA: Diagnosis not present

## 2021-02-23 DIAGNOSIS — Z992 Dependence on renal dialysis: Secondary | ICD-10-CM | POA: Diagnosis not present

## 2021-02-23 DIAGNOSIS — Z7982 Long term (current) use of aspirin: Secondary | ICD-10-CM | POA: Diagnosis not present

## 2021-02-23 DIAGNOSIS — I120 Hypertensive chronic kidney disease with stage 5 chronic kidney disease or end stage renal disease: Secondary | ICD-10-CM | POA: Insufficient documentation

## 2021-02-23 DIAGNOSIS — Z794 Long term (current) use of insulin: Secondary | ICD-10-CM | POA: Diagnosis not present

## 2021-02-23 DIAGNOSIS — E1122 Type 2 diabetes mellitus with diabetic chronic kidney disease: Secondary | ICD-10-CM | POA: Diagnosis not present

## 2021-02-23 DIAGNOSIS — Z79899 Other long term (current) drug therapy: Secondary | ICD-10-CM | POA: Diagnosis not present

## 2021-02-23 DIAGNOSIS — R109 Unspecified abdominal pain: Secondary | ICD-10-CM

## 2021-02-23 LAB — CBC
HCT: 37.1 % — ABNORMAL LOW (ref 39.0–52.0)
Hemoglobin: 12 g/dL — ABNORMAL LOW (ref 13.0–17.0)
MCH: 26.8 pg (ref 26.0–34.0)
MCHC: 32.3 g/dL (ref 30.0–36.0)
MCV: 82.8 fL (ref 80.0–100.0)
Platelets: 209 10*3/uL (ref 150–400)
RBC: 4.48 MIL/uL (ref 4.22–5.81)
RDW: 14 % (ref 11.5–15.5)
WBC: 7.9 10*3/uL (ref 4.0–10.5)
nRBC: 0 % (ref 0.0–0.2)

## 2021-02-23 LAB — LIPASE, BLOOD: Lipase: 93 U/L — ABNORMAL HIGH (ref 11–51)

## 2021-02-23 LAB — COMPREHENSIVE METABOLIC PANEL
ALT: 11 U/L (ref 0–44)
AST: 10 U/L — ABNORMAL LOW (ref 15–41)
Albumin: 3.9 g/dL (ref 3.5–5.0)
Alkaline Phosphatase: 59 U/L (ref 38–126)
Anion gap: 12 (ref 5–15)
BUN: 35 mg/dL — ABNORMAL HIGH (ref 6–20)
CO2: 30 mmol/L (ref 22–32)
Calcium: 8.7 mg/dL — ABNORMAL LOW (ref 8.9–10.3)
Chloride: 91 mmol/L — ABNORMAL LOW (ref 98–111)
Creatinine, Ser: 7.55 mg/dL — ABNORMAL HIGH (ref 0.61–1.24)
GFR, Estimated: 8 mL/min — ABNORMAL LOW (ref >60)
Glucose, Bld: 419 mg/dL — ABNORMAL HIGH (ref 70–99)
Potassium: 4.2 mmol/L (ref 3.5–5.1)
Sodium: 133 mmol/L — ABNORMAL LOW (ref 135–145)
Total Bilirubin: 0.4 mg/dL (ref 0.3–1.2)
Total Protein: 7.4 g/dL (ref 6.5–8.1)

## 2021-02-23 MED ORDER — MORPHINE SULFATE (PF) 4 MG/ML IV SOLN
4.0000 mg | Freq: Once | INTRAVENOUS | Status: AC
Start: 2021-02-23 — End: 2021-02-23
  Administered 2021-02-23: 4 mg via INTRAVENOUS
  Filled 2021-02-23: qty 1

## 2021-02-23 MED ORDER — HYDROCODONE-ACETAMINOPHEN 7.5-325 MG PO TABS: 1.0000 | ORAL_TABLET | Freq: Three times a day (TID) | ORAL | 0 refills | Status: AC | PRN

## 2021-02-23 MED ORDER — HYDROCODONE-ACETAMINOPHEN 10-325 MG PO TABS: 1.0000 | ORAL_TABLET | Freq: Once | ORAL | Status: DC

## 2021-02-23 MED ORDER — HYDROCODONE-ACETAMINOPHEN 5-325 MG PO TABS
2.0000 | ORAL_TABLET | Freq: Once | ORAL | Status: AC
Start: 2021-02-23 — End: 2021-02-23
  Administered 2021-02-23: 2 via ORAL
  Filled 2021-02-23: qty 2

## 2021-02-23 NOTE — Discharge Instructions (Addendum)
You have been seen and discharged from the emergency department.  Your x-ray and CAT scan were normal.  I believe you are suffering from rib pain secondary to coughing.  Take pain medicine medicine as prescribed.  Do not mix this medication with alcohol or other sedating medications. Do not drive or do heavy physical activity and to know how this medication affects you.  It may cause drowsiness.  Follow-up with your primary provider for reevaluation and further care. Take home medications as prescribed. If you have any worsening symptoms, worsening chest pain, difficulty breathing or further concerns for your health please return to an emergency department for further evaluation.

## 2021-02-23 NOTE — ED Provider Notes (Signed)
[facility] Provider Note   CSN: [id number] Arrival date & time: 02/23/21  0705     History Chief Complaint  Patient presents with   Abdominal Pain    Jeremy [name] is a 55 y.o. male.  HPI  55 year old male past medical history of ESRD on HD, morbid obesity, HTN, HLD, DM presents the emergency department with right upper abdominal pain.  Patient states has been going on for the past 2 weeks.  Initially was mild and intermittent.  Over the last couple days has become more severe.  He got approximately half his dialysis session today when he stopped it secondary to the right-sided pain to come and be evaluated.  No history of kidney stones or gallbladder pathology.  Denies any nausea/vomiting/diarrhea.  He has chronic shortness of breath since COVID infection a year and a half ago but denies any acute change with that.  No cough/hemoptysis.  No other chest/back pain.  Past Medical History:  Diagnosis Date   Abscess    AKI (acute kidney injury) ([place]) 08/05/2017   Bell's palsy    right eye abn since dx bell's palsy   CKD (chronic kidney disease)    Dr. McDiarmind    Dyspnea    occasional with exertion   Headache    High cholesterol    Hypertension    Left shoulder pain 10/26/2013   S/p injection on 10/26/13    Renal insufficiency 02/14/2014   Seizures ([place]) 08/04/2017   "related to high blood sugars" (08/05/2017)   Type II diabetes mellitus ([place])     Patient Active Problem List   Diagnosis Date Noted   ESRD on dialysis ([place]) 11/09/2019   Allergy, unspecified, initial encounter 08/16/2019   Anaphylactic shock, unspecified, initial encounter 08/16/2019   Anemia in chronic kidney disease 37/16/9678   Complication of vascular dialysis catheter 08/16/2019   Fever, unspecified 08/16/2019   Iron deficiency anemia, unspecified 08/16/2019   Other specified coagulation defects ([place]) 08/16/2019   Pruritus, unspecified 08/16/2019   Shortness of breath  08/16/2019   Type 2 diabetes mellitus with diabetic peripheral angiopathy without gangrene ([place]) 08/16/2019   COVID-19 virus infection 08/08/2019   Acute right ankle pain 07/27/2019   Tinnitus 05/26/2019   OSA (obstructive sleep apnea) 10/07/2018   Resistant hypertension 09/23/2018   Secondary hyperparathyroidism ([place]) 04/06/2018   Pituitary incidentaloma ([place]) 08/10/2017   Seizure ([place])    Bilateral lower extremity edema 04/08/2017   Uncontrolled type 2 diabetes mellitus with diabetic nephropathy, with long-term current use of insulin ([place]) 03/03/2017   Chronic kidney disease (CKD), stage IV (severe) ([place]) 02/14/2014   Morbid obesity ([place]) 05/20/2013   Erectile dysfunction associated with type 2 diabetes mellitus ([place]) 06/02/2012   Macular edema 12/12/2011   Mixed hyperlipidemia due to type 2 diabetes mellitus ([place]) 11/11/2011    Past Surgical History:  Procedure Laterality Date   AV FISTULA PLACEMENT Left 11/11/2019   Procedure: LEFT FOREARM RADIOCEPHALIC ARTERIOVENOUS (AV) FISTULA CREATION;  Surgeon: [name], MD;  Location: [facility];  Service: Vascular;  Laterality: Left;   Eyelid surgery Right    I & D EXTREMITY  11/11/2011   Procedure: IRRIGATION AND DEBRIDEMENT EXTREMITY;  Surgeon: Merrie Roof, MD;  Location: [place] [facility];  Service: General;  Laterality: Right;  I&D Right Thigh Abscess   IR FLUORO GUIDE CV LINE RIGHT  08/12/2019   IR US GUIDE VASC ACCESS RIGHT  08/12/2019   REVISON OF ARTERIOVENOUS FISTULA Left 03/23/2020   Procedure: LEFT  ARM ARTERIOVENOUS FISTULA REVISON, LIGATION OF SIDE BRANCHES AND ELEVATION;  Surgeon: [name], MD;  Location: [facility] OR;  Service: Vascular;  Laterality: Left;       Family History  Problem Relation Age of Onset   Diabetes Mother    Heart disease Mother 42   Diabetes Sister    Diabetes Brother    Diabetes Brother    Cancer Maternal Uncle        type unknown   Stroke Neg Hx     Social History   Tobacco Use   Smoking  status: Never   Smokeless tobacco: Never  Vaping Use   Vaping Use: Never used  Substance Use Topics   Alcohol use: Not Currently   Drug use: No    Home Medications Prior to Admission medications   Medication Sig Start Date End Date Taking? Authorizing Provider  amLODipine (NORVASC) 10 MG tablet Take 1 tablet (10 mg total) by mouth daily. 08/15/20  Yes Gildardo Pounds, NP  aspirin EC 81 MG tablet Take 1 tablet (81 mg total) by mouth daily. 12/05/20  Yes Gildardo Pounds, NP  atorvastatin (LIPITOR) 40 MG tablet Take 1 tablet (40 mg total) by mouth daily. 12/05/20 03/05/21 Yes Gildardo Pounds, NP  Blood Glucose Monitoring Suppl (CONTOUR NEXT MONITOR) w/Device KIT 1 Device by Does not apply route 4 (four) times daily - after meals and at bedtime. 08/23/19  Yes Mullis, Kiersten P, DO  enalapril (VASOTEC) 5 MG tablet Take 1 tablet (5 mg total) by mouth daily. 12/05/20  Yes Gildardo Pounds, NP  glucose blood test strip Use as instructed. Inject into the skin 3 times daily E11.65 11/30/19  Yes Gildardo Pounds, NP  hydrALAZINE (APRESOLINE) 25 MG tablet Take 1 tablet (25 mg total) by mouth 3 (three) times daily. 12/05/20  Yes Gildardo Pounds, NP  Insulin Lispro Prot & Lispro (HUMALOG MIX 75/25 KWIKPEN) (75-25) 100 UNIT/ML Kwikpen Inject 30 Units into the skin 2 (two) times daily. 01/02/21 04/02/21 Yes Gildardo Pounds, NP  Insulin Pen Needle (B-D ULTRAFINE III SHORT PEN) 31G X 8 MM MISC 1 application by Does not apply route 3 (three) times daily. E11.65 12/05/20  Yes Gildardo Pounds, NP  Lancet Devices (MICROLET NEXT LANCING DEVICE) MISC 1 Device by Does not apply route 3 (three) times daily. 11/30/19  Yes Gildardo Pounds, NP  levETIRAcetam (KEPPRA XR) 500 MG 24 hr tablet Take 2 tablets (1,000 mg total) by mouth daily. 12/05/20  Yes Gildardo Pounds, NP  Microlet Lancets MISC 1 Device by Does not apply route 3 (three) times daily. E11.65 11/30/19  Yes Gildardo Pounds, NP  traZODone (DESYREL) 150 MG tablet  Take 1 tablet (150 mg total) by mouth at bedtime. 12/05/20 03/05/21 Yes Gildardo Pounds, NP  VELPHORO 500 MG chewable tablet Chew 500 mg by mouth 3 (three) times daily. 02/07/20  Yes [provider]  carvedilol (COREG) 6.25 MG tablet Take 1 tablet (6.25 mg total) by mouth 2 (two) times daily with a meal. 12/05/20 03/05/21  Gildardo Pounds, NP  famotidine (PEPCID) 10 MG tablet Take 1 tablet (10 mg total) by mouth 2 (two) times daily. 01/01/21 01/31/21  Gildardo Pounds, NP  lidocaine-prilocaine (EMLA) cream SMARTSIG:Sparingly Topical 02/11/20   [provider]    Allergies    Amlodipine  Review of Systems   Review of Systems  Constitutional:  Negative for chills and fever.  HENT:  Negative for congestion.  Eyes:  Negative for visual disturbance.  Respiratory:  Negative for cough and chest tightness.   Cardiovascular:  Negative for chest pain, palpitations and leg swelling.  Gastrointestinal:  Positive for abdominal pain. Negative for diarrhea and vomiting.  Musculoskeletal:  Negative for back pain.  Skin:  Negative for rash.  Neurological:  Negative for headaches.   Physical Exam Updated Vital Signs BP 126/75 (BP Location: Right Arm)   Pulse 84   Temp 98 F (36.7 C) (Oral)   Resp 19   Ht _0  (1.727 m)   Wt (!) 141.5 kg   SpO2 96%   BMI 47.44 kg/m   Physical Exam Vitals and nursing note reviewed.  Constitutional:      General: He is not in acute distress.    Appearance: Normal appearance. He is obese.  HENT:     Head: Normocephalic.     Mouth/Throat:     Mouth: Mucous membranes are moist.  Cardiovascular:     Rate and Rhythm: Normal rate.  Pulmonary:     Effort: Pulmonary effort is normal. No respiratory distress.  Abdominal:     General: Abdomen is protuberant.     Palpations: Abdomen is soft.     Tenderness: There is abdominal tenderness in the right upper quadrant. There is no guarding or rebound. Negative signs include Murphy's sign.  Skin:     General: Skin is warm.  Neurological:     Mental Status: He is alert and oriented to person, place, and time. Mental status is at baseline.  Psychiatric:        Mood and Affect: Mood normal.    ED Results / Procedures / Treatments   Labs (all labs ordered are listed, but only abnormal results are displayed) Labs Reviewed  LIPASE, BLOOD - Abnormal; Notable for the following components:      Result Value   Lipase 93 (*)    All other components within normal limits  COMPREHENSIVE METABOLIC PANEL - Abnormal; Notable for the following components:   Sodium 133 (*)    Chloride 91 (*)    Glucose, Bld 419 (*)    BUN 35 (*)    Creatinine, Ser 7.55 (*)    Calcium 8.7 (*)    AST 10 (*)    GFR, Estimated 8 (*)    All other components within normal limits  CBC - Abnormal; Notable for the following components:   Hemoglobin 12.0 (*)    HCT 37.1 (*)    All other components within normal limits  URINALYSIS, ROUTINE W REFLEX MICROSCOPIC    EKG None  Radiology CT Renal Stone Study  Result Date: 02/23/2021 CLINICAL DATA:  Flank pain EXAM: CT ABDOMEN AND PELVIS WITHOUT CONTRAST TECHNIQUE: Multidetector CT imaging of the abdomen and pelvis was performed following the standard protocol without IV contrast. COMPARISON:  Renal ultrasound, 08/09/2019.  CT pelvis, 11/11/2011. FINDINGS: Lower chest: No acute abnormality. Hepatobiliary: No focal liver abnormality is seen. No gallstones, gallbladder wall thickening, or biliary dilatation. Pancreas: Unremarkable. No pancreatic ductal dilatation or surrounding inflammatory changes. Spleen: Normal in size without focal abnormality. Adrenals/Urinary Tract: Adrenal glands are unremarkable. Trace bilateral perirenal stranding. Kidneys are normal, without renal calculi, focal lesion, or hydronephrosis. Bladder is unremarkable. Stomach/Bowel: Stomach is within normal limits. Appendix appears normal. No evidence of bowel wall thickening, distention, or inflammatory  changes. Vascular/Lymphatic: Aorta and iliac vascular calcifications. No aneurysmal dilation. No enlarged abdominal or pelvic lymph nodes. Reproductive: Prostate is unremarkable. Seminal vascular calcifications. Pelvic phleboliths. Other:  Rotund abdomen with diastasis recti. Fat-containing umbilical hernia Musculoskeletal: No acute or significant osseous findings. IMPRESSION: 1. No radiodense renal calculi or hydronephrosis. 2. No acute abdominopelvic process. 3.  Aortic Atherosclerosis (ICD10-I70.0). Electronically Signed   By: Michaelle Birks MD   On: 02/23/2021 08:59    Procedures Procedures   Medications Ordered in ED Medications  morphine 4 MG/ML injection 4 mg (4 mg Intravenous Given 02/23/21 0801)    ED Course  I have reviewed the triage vital signs and the nursing notes.  Pertinent labs & imaging results that were available during my care of the patient were reviewed by me and considered in my medical decision making (see chart for details).    MDM Rules/Calculators/A&P                            55 year old male presents emergency department with right upper abdominal pain that radiates along his ribs to his side.  Patient states this started after a coughing spell.  The pain is mainly present when he is coughing.  Denies any other chest pain, back pain, worsening shortness of breath, hemoptysis.  He denies any history of kidney stones and genitourinary symptoms.  Blood work is baseline for his dialysis status.  No indication for emergent dialysis today.  CT of the abdomen pelvis identifies no acute pathology.  Rib x-ray showed no fracture and no pneumonia or lung findings.  Very low suspicion for PE given the pain pattern and normal vitals.  He is a [place] of 0.  Plan to treat the side pain as musculoskeletal with strict return to ED precautions.  Patient at this time appears safe and stable for discharge and will be treated as an outpatient.  Discharge plan and strict return to ED  precautions discussed, patient verbalizes understanding and agreement.   Final Clinical Impression(s) / ED Diagnoses Final diagnoses:  None    Rx / DC Orders ED Discharge Orders     None        Lorelle Gibbs, DO 02/23/21 1247

## 2021-02-23 NOTE — ED Triage Notes (Signed)
Pt presents with right side pain/right side abdominal pain for approximately 2 weeks that has gotten worse over the last couple of days.  He states he has had a cough for the last couple of weeks.  He reports shortness of breath since he had Covid approximately 1 1/2 years ago, but shortness of breath has not changed since having side/abdominal pain.  Pt states he is a hemodialysis pt and has HD M, W, F and had to stop his treatment early today due to pain.

## 2021-03-02 ENCOUNTER — Other Ambulatory Visit: Payer: Self-pay | Admitting: Nurse Practitioner

## 2021-03-02 DIAGNOSIS — I10 Essential (primary) hypertension: Secondary | ICD-10-CM

## 2021-03-15 ENCOUNTER — Telehealth: Payer: Self-pay | Admitting: Nurse Practitioner

## 2021-03-15 NOTE — Telephone Encounter (Signed)
Copied from [place] [id number]. Topic: General - Other >> Mar 15, 2021  2:01 PM Pawlus, Brayton Layman A wrote: Reason for CRM: Pt stated he dropped off paperwork for his drivers license, pt stated if this paperwork is not completed he will lose his license, pt stated it needs to be faxed over to the [facility] in [place] as soon as possible. Please advise.

## 2021-03-15 NOTE — Telephone Encounter (Signed)
Patient will be called once paperwork is complete.

## 2021-03-19 NOTE — Telephone Encounter (Signed)
Pt called stating that he is supposed to have this paperwork filled out by 03/13/21. He states that he is needing to have this sent in today due to his drivers license being suspended. Please advise.

## 2021-03-20 NOTE — Telephone Encounter (Signed)
Paperwork was left with CMA Magdalen Spatz a few days ago on her desk to call patient to pick up.

## 2021-03-21 NOTE — Telephone Encounter (Signed)
Paperwork was faxed on 03/19/21 at 3:50pm, pt was given original copies of the paperwork on 03/19/21. Fax log and conformation page has been placed in PCP folders.

## 2021-03-23 ENCOUNTER — Ambulatory Visit (HOSPITAL_BASED_OUTPATIENT_CLINIC_OR_DEPARTMENT_OTHER): Payer: Self-pay | Attending: Nurse Practitioner | Admitting: Internal Medicine

## 2021-04-02 DIAGNOSIS — N186 End stage renal disease: Secondary | ICD-10-CM | POA: Diagnosis not present

## 2021-04-02 DIAGNOSIS — Z992 Dependence on renal dialysis: Secondary | ICD-10-CM | POA: Diagnosis not present

## 2021-04-02 DIAGNOSIS — N2581 Secondary hyperparathyroidism of renal origin: Secondary | ICD-10-CM | POA: Diagnosis not present

## 2021-04-03 ENCOUNTER — Encounter: Payer: Self-pay | Admitting: Nurse Practitioner

## 2021-04-03 ENCOUNTER — Other Ambulatory Visit: Payer: Self-pay

## 2021-04-03 ENCOUNTER — Ambulatory Visit: Payer: Managed Care, Other (non HMO) | Attending: Nurse Practitioner | Admitting: Nurse Practitioner

## 2021-04-03 VITALS — BP 179/110 | HR 77 | Resp 16 | Wt 317.6 lb

## 2021-04-03 DIAGNOSIS — R053 Chronic cough: Secondary | ICD-10-CM

## 2021-04-03 DIAGNOSIS — Z6841 Body Mass Index (BMI) 40.0 and over, adult: Secondary | ICD-10-CM

## 2021-04-03 DIAGNOSIS — E1151 Type 2 diabetes mellitus with diabetic peripheral angiopathy without gangrene: Secondary | ICD-10-CM

## 2021-04-03 DIAGNOSIS — I10 Essential (primary) hypertension: Secondary | ICD-10-CM

## 2021-04-03 DIAGNOSIS — Z794 Long term (current) use of insulin: Secondary | ICD-10-CM

## 2021-04-03 LAB — POCT GLYCOSYLATED HEMOGLOBIN (HGB A1C): HbA1c, POC (controlled diabetic range): 12.9 % — AB (ref 0.0–7.0)

## 2021-04-03 LAB — GLUCOSE, POCT (MANUAL RESULT ENTRY): POC Glucose: 400 mg/dl — AB (ref 70–99)

## 2021-04-03 MED ORDER — OMEPRAZOLE 20 MG PO CPDR
20.0000 mg | DELAYED_RELEASE_CAPSULE | Freq: Every day | ORAL | 3 refills | Status: DC
Start: 2021-04-03 — End: 2022-08-14

## 2021-04-03 MED ORDER — BD PEN NEEDLE SHORT U/F 31G X 8 MM MISC: 1.0000 "application " | Freq: Three times a day (TID) | 11 refills | Status: DC

## 2021-04-03 MED ORDER — SEMAGLUTIDE (1 MG/DOSE) 4 MG/3ML ~~LOC~~ SOPN: 1.0000 mg | PEN_INJECTOR | SUBCUTANEOUS | 1 refills | Status: AC

## 2021-04-03 MED ORDER — INSULIN LISPRO PROT & LISPRO (75-25 MIX) 100 UNIT/ML KWIKPEN: 30.0000 [IU] | PEN_INJECTOR | Freq: Three times a day (TID) | SUBCUTANEOUS | 1 refills | Status: DC

## 2021-04-03 NOTE — Progress Notes (Signed)
Patient has some concerns about paperwork.

## 2021-04-03 NOTE — Progress Notes (Signed)
Assessment & Plan:  Jeremy [name] was seen today for medication refill and follow-up.  Diagnoses and all orders for this visit:  Type 2 diabetes mellitus with diabetic peripheral angiopathy without gangrene, with long-term current use of insulin (HCC) -     POCT glycosylated hemoglobin (Hb A1C) -     POCT glucose (manual entry) -     Insulin Lispro Prot & Lispro (HUMALOG MIX 75/25 KWIKPEN) (75-25) 100 UNIT/ML Kwikpen; Inject 30 Units into the skin 3 (three) times daily. -     Insulin Pen Needle (B-D ULTRAFINE III SHORT PEN) 31G X 8 MM MISC; 1 application by Does not apply route 3 (three) times daily. E11.65 Continue blood sugar control as discussed in office today, low carbohydrate diet, and regular physical exercise as tolerated, 150 minutes per week (30 min each day, 5 days per week, or 50 min 3 days per week). Keep blood sugar logs with fasting goal of 90-130 mg/dl, post prandial (after you eat) less than 180.  For Hypoglycemia: BS <60 and Hyperglycemia BS >400; contact the clinic ASAP. Annual eye exams and foot exams are recommended.   Morbid obesity with BMI of 45.0-49.9, adult (HCC) -     Semaglutide, 1 MG/DOSE, 4 MG/3ML SOPN; Inject 1 mg as directed once a week. Discussed diet and exercise for person with BMI >47. Instructed: You must burn more calories than you eat. Losing 5 percent of your body weight should be considered a success. In the longer term, losing more than 15 percent of your body weight and staying at this weight is an extremely good result. However, keep in mind that even losing 5 percent of your body weight leads to important health benefits, so try not to get discouraged if you're not able to lose more than this.   Chronic cough -     omeprazole (PRILOSEC) 20 MG capsule; Take 1 capsule (20 mg total) by mouth daily. INSTRUCTIONS: Avoid GERD Triggers: acidic, spicy or fried foods, caffeine, coffee, sodas,  alcohol and chocolate.    Essential hypertension Continue all  antihypertensives as prescribed.  Remember to bring in your blood pressure log with you for your follow up appointment.  DASH/Mediterranean Diets are healthier choices for HTN.     Patient has been counseled on age-appropriate routine health concerns for screening and prevention. These are reviewed and up-to-date. Referrals have been placed accordingly. Immunizations are up-to-date or declined.    Subjective:   Chief Complaint  Patient presents with   Medication Refill   Follow-up   HPI Jeremy [name] 55 y.o. male presents to office today for follow up to DM, HTN, HPL PMH:  Abscess,  Bell's palsy, ESRD on HD, High cholesterol, Hypertension,Seizures (08/04/2017), and Poorly controlledType II diabetes mellitus    He has been out of his medications since several days ago.    PSA is elevated I referred him to Urology. It was elevated one year ago 10-2019 however we were never able to reach him due to no VM being set up on his phone and him not answering calls. He was referred to Urology 07-2020 after a tele visit. Their notes state they were unable to schedule him.  We also sent 2 letters. He was lost to follow up and did not return to the lab for UA.  I saw him again in October 2021 however he left prior to having labs drawn and stated he did not have time for labs. He states he is unable to give a urine  specimen today. He is on HD for ESRD however he is not anuric. I have explained the urgency of his evaluation by Urology. He was given the number to [facility] today to schedule.  Lab Results  Component Value Date   PSA1 18.1 (H) 08/18/2020   PSA1 18.2 (H) 11/09/2019      Cough Patient complains of nonproductive coughing spells.   Symptoms began several months ago.  The cough is non-productive, without wheezing, dyspnea or hemoptysis and is aggravated by nothing . There are no Associated symptoms. Patient does not have new pets. Patient does not have a history of asthma. Patient does not  have a history of environmental allergens. Patient did not have recent travel. Patient does not have a history of smoking. Patient  had previous Chest X-ray and has a history of COVID. Patient did not have have a PPD done. I prescribed pepcid however he states that made his coug worse   DM 2 Poorly controlled. He has been out of Ozempic 1 mg weekly, humalog 75/25 30units BID. LDL not at goal with atorvastatin 40 mg daily. Will increase humalog to TID.  Lab Results  Component Value Date   HGBA1C 12.9 (A) 04/03/2021    Lab Results  Component Value Date   HGBA1C 11.7 (A) 12/05/2020    Lab Results  Component Value Date   LDLCALC 104 (H) 08/18/2020     HTN Poorly controlled. He has been out of his medications amlodipine 10 mg daily, carvedilol 6.25 mg BID, Enalapril 5 mg daily, hydralazine 25 mg TID. Denies chest pain, shortness of breath, palpitations, lightheadedness, dizziness, headaches or BLE edema.   BP Readings from Last 3 Encounters:  04/03/21 (!) 179/11  02/23/21 134/77  02/07/21 125/76     Review of Systems  Constitutional:  Negative for fever, malaise/fatigue and weight loss.  HENT: Negative.  Negative for nosebleeds.   Eyes: Negative.  Negative for blurred vision, double vision and photophobia.  Respiratory:  Positive for cough. Negative for shortness of breath.   Cardiovascular: Negative.  Negative for chest pain, palpitations and leg swelling.  Gastrointestinal: Negative.  Negative for heartburn, nausea and vomiting.  Musculoskeletal: Negative.  Negative for myalgias.  Neurological: Negative.  Negative for dizziness, focal weakness, seizures and headaches.  Psychiatric/Behavioral: Negative.  Negative for suicidal ideas.    Past Medical History:  Diagnosis Date   Abscess    AKI (acute kidney injury) ([place]) 08/05/2017   Bell's palsy    right eye abn since dx bell's palsy   CKD (chronic kidney disease)    Dr. McDiarmind    Dyspnea    occasional with exertion    Headache    High cholesterol    Hypertension    Left shoulder pain 10/26/2013   S/p injection on 10/26/13    Renal insufficiency 02/14/2014   Seizures ([place]) 08/04/2017   "related to high blood sugars" (08/05/2017)   Type II diabetes mellitus ([place])     Past Surgical History:  Procedure Laterality Date   AV FISTULA PLACEMENT Left 11/11/2019   Procedure: LEFT FOREARM RADIOCEPHALIC ARTERIOVENOUS (AV) FISTULA CREATION;  Surgeon: [name], MD;  Location: [facility];  Service: Vascular;  Laterality: Left;   Eyelid surgery Right    I & D EXTREMITY  11/11/2011   Procedure: IRRIGATION AND DEBRIDEMENT EXTREMITY;  Surgeon: Merrie Roof, MD;  Location: [facility];  Service: General;  Laterality: Right;  I&D Right Thigh Abscess   IR FLUORO GUIDE CV LINE RIGHT  08/12/2019   IR US GUIDE VASC ACCESS RIGHT  08/12/2019   REVISON OF ARTERIOVENOUS FISTULA Left 03/23/2020   Procedure: LEFT ARM ARTERIOVENOUS FISTULA REVISON, LIGATION OF SIDE BRANCHES AND ELEVATION;  Surgeon: [name], MD;  [facility];  Service: Vascular;  Laterality: Left;    Family History  Problem Relation Age of Onset   Diabetes Mother    Heart disease Mother 24   Diabetes Sister    Diabetes Brother    Diabetes Brother    Cancer Maternal Uncle        type unknown   Stroke Neg Hx     Social History Reviewed with no changes to be made today.   Outpatient Medications Prior to Visit  Medication Sig Dispense Refill   amLODipine (NORVASC) 10 MG tablet TAKE 1 TABLET BY MOUTH EVERY DAY 90 tablet 0   aspirin EC 81 MG tablet Take 1 tablet (81 mg total) by mouth daily. 90 tablet 3   Blood Glucose Monitoring Suppl (CONTOUR NEXT MONITOR) w/Device KIT 1 Device by Does not apply route 4 (four) times daily - after meals and at bedtime. 1 kit 0   enalapril (VASOTEC) 5 MG tablet Take 1 tablet (5 mg total) by mouth daily. 90 tablet 1   glucose blood test strip Use as instructed. Inject into the skin 3 times daily E11.65 200 each 12    hydrALAZINE (APRESOLINE) 25 MG tablet TAKE 1 TABLET BY MOUTH THREE TIMES A DAY 270 tablet 0   Lancet Devices (MICROLET NEXT LANCING DEVICE) MISC 1 Device by Does not apply route 3 (three) times daily. 1 each 11   levETIRAcetam (KEPPRA XR) 500 MG 24 hr tablet Take 2 tablets (1,000 mg total) by mouth daily. 180 tablet 3   lidocaine-prilocaine (EMLA) cream SMARTSIG:Sparingly Topical     Microlet Lancets MISC 1 Device by Does not apply route 3 (three) times daily. E11.65 200 each 11   VELPHORO 500 MG chewable tablet Chew 500 mg by mouth 3 (three) times daily.     Insulin Pen Needle (B-D ULTRAFINE III SHORT PEN) 31G X 8 MM MISC 1 application by Does not apply route 3 (three) times daily. E11.65 200 each 11   atorvastatin (LIPITOR) 40 MG tablet Take 1 tablet (40 mg total) by mouth daily. 90 tablet 3   carvedilol (COREG) 6.25 MG tablet Take 1 tablet (6.25 mg total) by mouth 2 (two) times daily with a meal. 180 tablet 1   famotidine (PEPCID) 10 MG tablet Take 1 tablet (10 mg total) by mouth 2 (two) times daily. 60 tablet 1   traZODone (DESYREL) 150 MG tablet Take 1 tablet (150 mg total) by mouth at bedtime. 90 tablet 2   Insulin Lispro Prot & Lispro (HUMALOG MIX 75/25 KWIKPEN) (75-25) 100 UNIT/ML Kwikpen Inject 30 Units into the skin 2 (two) times daily. 54 mL 1   No facility-administered medications prior to visit.    Allergies  Allergen Reactions   Amlodipine Other (See Comments)    Dizziness       Objective:    BP (!) 179/11   Pulse 77   Resp 16   Wt (!) 317 lb 9.6 oz (144.1 kg)   SpO2 96%   BMI 48.29 kg/m  Wt Readings from Last 3 Encounters:  04/03/21 (!) 317 lb 9.6 oz (144.1 kg)  02/23/21 (!) 312 lb (141.5 kg)  02/07/21 (!) 320 lb (145.2 kg)    Physical Exam Vitals and nursing note reviewed.  Constitutional:  Appearance: He is well-developed.  HENT:     Head: Normocephalic and atraumatic.  Cardiovascular:     Rate and Rhythm: Normal rate and regular rhythm.     Heart  sounds: Normal heart sounds. No murmur heard.   No friction rub. No gallop.  Pulmonary:     Effort: Pulmonary effort is normal. No tachypnea or respiratory distress.     Breath sounds: Normal breath sounds. No decreased breath sounds, wheezing, rhonchi or rales.  Chest:     Chest wall: No tenderness.  Abdominal:     General: Bowel sounds are normal.     Palpations: Abdomen is soft.  Musculoskeletal:        General: Normal range of motion.     Cervical back: Normal range of motion.  Skin:    General: Skin is warm and dry.  Neurological:     Mental Status: He is alert and oriented to person, place, and time.     Coordination: Coordination normal.  Psychiatric:        Behavior: Behavior normal. Behavior is cooperative.        Thought Content: Thought content normal.        Judgment: Judgment normal.         Patient has been counseled extensively about nutrition and exercise as well as the importance of adherence with medications and regular follow-up. The patient was given clear instructions to go to ER or return to medical center if symptoms don't improve, worsen or new problems develop. The patient verbalized understanding.   Follow-up: Return in about 3 months (around 07/03/2021).   Gildardo Pounds, FNP-BC [facility] and [facility] [place], [place]   04/07/2021, 11:57 PM

## 2021-04-04 DIAGNOSIS — N2581 Secondary hyperparathyroidism of renal origin: Secondary | ICD-10-CM | POA: Diagnosis not present

## 2021-04-04 DIAGNOSIS — N186 End stage renal disease: Secondary | ICD-10-CM | POA: Diagnosis not present

## 2021-04-04 DIAGNOSIS — Z992 Dependence on renal dialysis: Secondary | ICD-10-CM | POA: Diagnosis not present

## 2021-04-07 ENCOUNTER — Encounter: Payer: Self-pay | Admitting: Nurse Practitioner

## 2021-04-20 DIAGNOSIS — Z992 Dependence on renal dialysis: Secondary | ICD-10-CM | POA: Diagnosis not present

## 2021-04-20 DIAGNOSIS — N186 End stage renal disease: Secondary | ICD-10-CM | POA: Diagnosis not present

## 2021-04-20 DIAGNOSIS — I129 Hypertensive chronic kidney disease with stage 1 through stage 4 chronic kidney disease, or unspecified chronic kidney disease: Secondary | ICD-10-CM | POA: Diagnosis not present

## 2021-05-02 DIAGNOSIS — E113311 Type 2 diabetes mellitus with moderate nonproliferative diabetic retinopathy with macular edema, right eye: Secondary | ICD-10-CM | POA: Diagnosis not present

## 2021-05-02 DIAGNOSIS — H524 Presbyopia: Secondary | ICD-10-CM | POA: Diagnosis not present

## 2021-05-02 DIAGNOSIS — E113312 Type 2 diabetes mellitus with moderate nonproliferative diabetic retinopathy with macular edema, left eye: Secondary | ICD-10-CM | POA: Diagnosis not present

## 2021-05-04 DIAGNOSIS — H524 Presbyopia: Secondary | ICD-10-CM | POA: Diagnosis not present

## 2021-05-07 ENCOUNTER — Ambulatory Visit: Payer: Managed Care, Other (non HMO) | Attending: Nurse Practitioner | Admitting: Nurse Practitioner

## 2021-05-07 ENCOUNTER — Other Ambulatory Visit: Payer: Self-pay

## 2021-05-07 ENCOUNTER — Encounter: Payer: Self-pay | Admitting: Nurse Practitioner

## 2021-05-07 VITALS — BP 146/89 | HR 100 | Ht 68.0 in | Wt 306.2 lb

## 2021-05-07 DIAGNOSIS — R519 Headache, unspecified: Secondary | ICD-10-CM

## 2021-05-07 DIAGNOSIS — R972 Elevated prostate specific antigen [PSA]: Secondary | ICD-10-CM

## 2021-05-07 DIAGNOSIS — I10 Essential (primary) hypertension: Secondary | ICD-10-CM

## 2021-05-07 MED ORDER — CARVEDILOL 3.125 MG PO TABS: 3.1250 mg | ORAL_TABLET | Freq: Two times a day (BID) | ORAL | 1 refills | Status: DC

## 2021-05-07 MED ORDER — ACETAMINOPHEN 500 MG PO TABS: 500.0000 mg | ORAL_TABLET | Freq: Four times a day (QID) | ORAL | 1 refills | Status: AC | PRN

## 2021-05-07 NOTE — Progress Notes (Signed)
Assessment & Plan:  Jeremy [name] was seen today for hypertension and headache.  Diagnoses and all orders for this visit:  Primary hypertension -     carvedilol (COREG) 3.125 MG tablet; Take 1 tablet (3.125 mg total) by mouth 2 (two) times daily with a meal. Continue all antihypertensives as prescribed.  Remember to bring in your blood pressure log with you for your follow up appointment.  DASH/Mediterranean Diets are healthier choices for HTN.    Elevated prostate specific antigen (PSA) -     PSA  Persistent headaches -     acetaminophen (TYLENOL) 500 MG tablet; Take 1 tablet (500 mg total) by mouth every 6 (six) hours as needed.   Patient has been counseled on age-appropriate routine health concerns for screening and prevention. These are reviewed and up-to-date. Referrals have been placed accordingly. Immunizations are up-to-date or declined.    Subjective:   Chief Complaint  Patient presents with   Hypertension   Headache   HPI Jeremy [name] 55 y.o. male presents to office today for headache. He states his headaches have been occurring over the past few weeks. Seem to become more frequent. He has a past medical history of Abscess,  Bell's palsy, CKD Dyspnea, Headache, High cholesterol, Hypertension, Left shoulder pain (10/26/2013), Renal insufficiency (02/14/2014), Seizures ([place]) (08/04/2017), and Type II diabetes mellitus, ESRD on HD [facility].  HTN Blood pressure is elevated however he continues to endorse hypotension during his HD visits. Blood pressure is very labile at home. He reports high and low readings with SBP as low as 90. States "the medication is making me feel terrible". He is currently taking amlodipine 10 mg daily carvedilol 6.25 mg twice daily, enalapril 5 mg daily and hydralazine 25 mg 3 times daily.  We will decrease carvedilol to 3.125 mg twice daily however we may have to resume his previous dose due to tachycardia. Denies chest pain, shortness of breath, palpitations,  lightheadedness, dizziness, or BLE edema.  He also notes persistent headaches during his HD treatments. Takes tylenol which does relieve his symptoms.  BP Readings from Last 3 Encounters:  05/07/21 (!) 146/89  04/03/21 (!) 179/110  02/23/21 134/77    PSA He was referred to Urology for elevated PSA. States he has an upcoming appointment next month to be evaluated. Denies being sexually active.     Review of Systems  Constitutional:  Negative for fever, malaise/fatigue and weight loss.  HENT: Negative.  Negative for nosebleeds.   Eyes: Negative.  Negative for blurred vision, double vision and photophobia.  Respiratory: Negative.  Negative for cough and shortness of breath.   Cardiovascular: Negative.  Negative for chest pain, palpitations and leg swelling.  Gastrointestinal: Negative.  Negative for heartburn, nausea and vomiting.  Musculoskeletal: Negative.  Negative for myalgias.  Neurological:  Positive for headaches. Negative for dizziness, focal weakness and seizures.  Psychiatric/Behavioral: Negative.  Negative for suicidal ideas.    Past Medical History:  Diagnosis Date   Abscess    AKI (acute kidney injury) ([place]) 08/05/2017   Bell's palsy    right eye abn since dx bell's palsy   CKD (chronic kidney disease)    Dr. McDiarmind    Dyspnea    occasional with exertion   Headache    High cholesterol    Hypertension    Left shoulder pain 10/26/2013   S/p injection on 10/26/13    Renal insufficiency 02/14/2014   Seizures ([place]) 08/04/2017   "related to high blood sugars" (08/05/2017)  Type II diabetes mellitus (HCC)     Past Surgical History:  Procedure Laterality Date   AV FISTULA PLACEMENT Left 11/11/2019   Procedure: LEFT FOREARM RADIOCEPHALIC ARTERIOVENOUS (AV) FISTULA CREATION;  Surgeon: Clark, Christopher J, MD;  [facility];  Service: Vascular;  Laterality: Left;   Eyelid surgery Right    I & D EXTREMITY  11/11/2011   Procedure: IRRIGATION AND DEBRIDEMENT EXTREMITY;   Surgeon: Paul S Toth III, MD;  [facility];  Service: General;  Laterality: Right;  I&D Right Thigh Abscess   IR FLUORO GUIDE CV LINE RIGHT  08/12/2019   IR US GUIDE VASC ACCESS RIGHT  08/12/2019   REVISON OF ARTERIOVENOUS FISTULA Left 03/23/2020   Procedure: LEFT ARM ARTERIOVENOUS FISTULA REVISON, LIGATION OF SIDE BRANCHES AND ELEVATION;  Surgeon: Clark, Christopher J, MD;  [facility];  Service: Vascular;  Laterality: Left;    Family History  Problem Relation Age of Onset   Diabetes Mother    Heart disease Mother 67   Diabetes Sister    Diabetes Brother    Diabetes Brother    Cancer Maternal Uncle        type unknown   Stroke Neg Hx     Social History Reviewed with no changes to be made today.   Outpatient Medications Prior to Visit  Medication Sig Dispense Refill   amLODipine (NORVASC) 10 MG tablet TAKE 1 TABLET BY MOUTH EVERY DAY 90 tablet 0   aspirin EC 81 MG tablet Take 1 tablet (81 mg total) by mouth daily. 90 tablet 3   Blood Glucose Monitoring Suppl (CONTOUR NEXT MONITOR) w/Device KIT 1 Device by Does not apply route 4 (four) times daily - after meals and at bedtime. 1 kit 0   enalapril (VASOTEC) 5 MG tablet Take 1 tablet (5 mg total) by mouth daily. 90 tablet 1   glucose blood test strip Use as instructed. Inject into the skin 3 times daily E11.65 200 each 12   hydrALAZINE (APRESOLINE) 25 MG tablet TAKE 1 TABLET BY MOUTH THREE TIMES A DAY 270 tablet 0   Insulin Lispro Prot & Lispro (HUMALOG MIX 75/25 KWIKPEN) (75-25) 100 UNIT/ML Kwikpen Inject 30 Units into the skin 3 (three) times daily. 81 mL 1   Insulin Pen Needle (B-D ULTRAFINE III SHORT PEN) 31G X 8 MM MISC 1 application by Does not apply route 3 (three) times daily. E11.65 200 each 11   Lancet Devices (MICROLET NEXT LANCING DEVICE) MISC 1 Device by Does not apply route 3 (three) times daily. 1 each 11   levETIRAcetam (KEPPRA XR) 500 MG 24 hr tablet Take 2 tablets (1,000 mg total) by mouth daily. 180 tablet 3    lidocaine-prilocaine (EMLA) cream SMARTSIG:Sparingly Topical     Microlet Lancets MISC 1 Device by Does not apply route 3 (three) times daily. E11.65 200 each 11   omeprazole (PRILOSEC) 20 MG capsule Take 1 capsule (20 mg total) by mouth daily. 30 capsule 3   Semaglutide, 1 MG/DOSE, 4 MG/3ML SOPN Inject 1 mg as directed once a week. 9 mL 1   VELPHORO 500 MG chewable tablet Chew 500 mg by mouth 3 (three) times daily.     atorvastatin (LIPITOR) 40 MG tablet Take 1 tablet (40 mg total) by mouth daily. 90 tablet 3   famotidine (PEPCID) 10 MG tablet Take 1 tablet (10 mg total) by mouth 2 (two) times daily. 60 tablet 1   traZODone (DESYREL) 150 MG tablet Take 1 tablet (150 mg   total) by mouth at bedtime. 90 tablet 2   carvedilol (COREG) 6.25 MG tablet Take 1 tablet (6.25 mg total) by mouth 2 (two) times daily with a meal. 180 tablet 1   No facility-administered medications prior to visit.    Allergies  Allergen Reactions   Amlodipine Other (See Comments)    Dizziness       Objective:    BP (!) 146/89   Pulse 100   Ht 5' 8" (1.727 m)   Wt (!) 306 lb 4 oz (138.9 kg)   SpO2 99%   BMI 46.57 kg/m  Wt Readings from Last 3 Encounters:  05/07/21 (!) 306 lb 4 oz (138.9 kg)  04/03/21 (!) 317 lb 9.6 oz (144.1 kg)  02/23/21 (!) 312 lb (141.5 kg)    Physical Exam Vitals and nursing note reviewed.  Constitutional:      Appearance: He is well-developed.  HENT:     Head: Normocephalic and atraumatic.  Cardiovascular:     Rate and Rhythm: Normal rate and regular rhythm.     Heart sounds: Normal heart sounds. No murmur heard.   No friction rub. No gallop.  Pulmonary:     Effort: Pulmonary effort is normal. No tachypnea or respiratory distress.     Breath sounds: Normal breath sounds. No decreased breath sounds, wheezing, rhonchi or rales.  Chest:     Chest wall: No tenderness.  Abdominal:     General: Bowel sounds are normal.     Palpations: Abdomen is soft.  Musculoskeletal:         General: Normal range of motion.     Cervical back: Normal range of motion.  Skin:    General: Skin is warm and dry.  Neurological:     Mental Status: He is alert and oriented to person, place, and time.     Coordination: Coordination normal.  Psychiatric:        Behavior: Behavior normal. Behavior is cooperative.        Thought Content: Thought content normal.        Judgment: Judgment normal.         Patient has been counseled extensively about nutrition and exercise as well as the importance of adherence with medications and regular follow-up. The patient was given clear instructions to go to ER or return to medical center if symptoms don't improve, worsen or new problems develop. The patient verbalized understanding.   Follow-up: Return in about 2 weeks (around 05/21/2021) for needs appt on Tuesday or Thursday with LUKE. Gildardo Pounds, FNP-BC [facility] and [facility] [place], [place]   05/07/2021, 2:27 PM

## 2021-05-08 ENCOUNTER — Telehealth: Payer: Self-pay

## 2021-05-08 LAB — PSA: Prostate Specific Ag, Serum: 25.1 ng/mL — ABNORMAL HIGH (ref 0.0–4.0)

## 2021-05-08 NOTE — Telephone Encounter (Signed)
-----   Message from Gildardo Pounds, NP sent at 05/08/2021  1:23 PM EDT ----- Prostate level still elevated and now up to 25 from 18. Make sure to follow up with Urology.

## 2021-05-08 NOTE — Telephone Encounter (Signed)
Pt called in stating he tried calling them but, they told him that the provider would have to call or send over the referral, please advise.

## 2021-05-09 DIAGNOSIS — T82858A Stenosis of vascular prosthetic devices, implants and grafts, initial encounter: Secondary | ICD-10-CM | POA: Diagnosis not present

## 2021-05-09 DIAGNOSIS — Z992 Dependence on renal dialysis: Secondary | ICD-10-CM | POA: Diagnosis not present

## 2021-05-09 DIAGNOSIS — I871 Compression of vein: Secondary | ICD-10-CM | POA: Diagnosis not present

## 2021-05-09 DIAGNOSIS — N186 End stage renal disease: Secondary | ICD-10-CM | POA: Diagnosis not present

## 2021-05-10 NOTE — Telephone Encounter (Signed)
Could you put in the referral in?

## 2021-05-11 ENCOUNTER — Other Ambulatory Visit: Payer: Self-pay | Admitting: Nurse Practitioner

## 2021-05-11 DIAGNOSIS — R972 Elevated prostate specific antigen [PSA]: Secondary | ICD-10-CM

## 2021-05-11 NOTE — Telephone Encounter (Signed)
He told me at his last office visit that he was already  scheduled. I have placed a new referral

## 2021-05-14 DIAGNOSIS — Z992 Dependence on renal dialysis: Secondary | ICD-10-CM | POA: Diagnosis not present

## 2021-05-14 DIAGNOSIS — N2581 Secondary hyperparathyroidism of renal origin: Secondary | ICD-10-CM | POA: Diagnosis not present

## 2021-05-14 DIAGNOSIS — N186 End stage renal disease: Secondary | ICD-10-CM | POA: Diagnosis not present

## 2021-05-16 DIAGNOSIS — Z992 Dependence on renal dialysis: Secondary | ICD-10-CM | POA: Diagnosis not present

## 2021-05-16 DIAGNOSIS — N2581 Secondary hyperparathyroidism of renal origin: Secondary | ICD-10-CM | POA: Diagnosis not present

## 2021-05-16 DIAGNOSIS — N186 End stage renal disease: Secondary | ICD-10-CM | POA: Diagnosis not present

## 2021-05-18 ENCOUNTER — Encounter (HOSPITAL_BASED_OUTPATIENT_CLINIC_OR_DEPARTMENT_OTHER): Payer: Self-pay | Admitting: Emergency Medicine

## 2021-05-18 ENCOUNTER — Emergency Department (HOSPITAL_BASED_OUTPATIENT_CLINIC_OR_DEPARTMENT_OTHER)
Admission: EM | Admit: 2021-05-18 | Discharge: 2021-05-18 | Disposition: A | Discharge status: Home / Self Care | Payer: Medicare Other | Attending: Emergency Medicine | Admitting: Emergency Medicine

## 2021-05-18 ENCOUNTER — Other Ambulatory Visit: Payer: Self-pay

## 2021-05-18 DIAGNOSIS — Z794 Long term (current) use of insulin: Secondary | ICD-10-CM | POA: Diagnosis not present

## 2021-05-18 DIAGNOSIS — I120 Hypertensive chronic kidney disease with stage 5 chronic kidney disease or end stage renal disease: Secondary | ICD-10-CM | POA: Insufficient documentation

## 2021-05-18 DIAGNOSIS — Z992 Dependence on renal dialysis: Secondary | ICD-10-CM | POA: Diagnosis not present

## 2021-05-18 DIAGNOSIS — E1122 Type 2 diabetes mellitus with diabetic chronic kidney disease: Secondary | ICD-10-CM | POA: Insufficient documentation

## 2021-05-18 DIAGNOSIS — Z7982 Long term (current) use of aspirin: Secondary | ICD-10-CM | POA: Diagnosis not present

## 2021-05-18 DIAGNOSIS — K0889 Other specified disorders of teeth and supporting structures: Secondary | ICD-10-CM | POA: Insufficient documentation

## 2021-05-18 DIAGNOSIS — N186 End stage renal disease: Secondary | ICD-10-CM | POA: Diagnosis not present

## 2021-05-18 MED ORDER — PENICILLIN V POTASSIUM 500 MG PO TABS: 500.0000 mg | ORAL_TABLET | Freq: Four times a day (QID) | ORAL | 0 refills | Status: AC

## 2021-05-18 MED ORDER — HYDROCODONE-ACETAMINOPHEN 5-325 MG PO TABS: 1.0000 | ORAL_TABLET | Freq: Four times a day (QID) | ORAL | 0 refills | Status: DC | PRN

## 2021-05-18 NOTE — Discharge Instructions (Addendum)
Take the antibiotic penicillin as directed.  Take the pain medicine as directed.  Give Dr. Alfonso Patten call for follow-up.  He is on-call for adult dental problems today.

## 2021-05-18 NOTE — ED Provider Notes (Signed)
[facility] Provider Note   CSN: [id number] Arrival date & time: 05/18/21  0403     History Chief Complaint  Patient presents with   Dental Pain    Jeremy [name] is a 55 y.o. male.  Patient with a complaint of upper and lower teeth pain for 2 days.  Has been trying BC powders without success.  Patient has a history of chronic kidney disease hypertension and diabetes.      Past Medical History:  Diagnosis Date   Abscess    AKI (acute kidney injury) ([place]) 08/05/2017   Bell's palsy    right eye abn since dx bell's palsy   CKD (chronic kidney disease)    Dr. McDiarmind    Dyspnea    occasional with exertion   Headache    High cholesterol    Hypertension    Left shoulder pain 10/26/2013   S/p injection on 10/26/13    Renal insufficiency 02/14/2014   Seizures ([place]) 08/04/2017   "related to high blood sugars" (08/05/2017)   Type II diabetes mellitus ([place])     Patient Active Problem List   Diagnosis Date Noted   ESRD on dialysis ([place]) 11/09/2019   Allergy, unspecified, initial encounter 08/16/2019   Anaphylactic shock, unspecified, initial encounter 08/16/2019   Anemia in chronic kidney disease 40/98/1191   Complication of vascular dialysis catheter 08/16/2019   Fever, unspecified 08/16/2019   Iron deficiency anemia, unspecified 08/16/2019   Other specified coagulation defects ([place]) 08/16/2019   Pruritus, unspecified 08/16/2019   Shortness of breath 08/16/2019   Type 2 diabetes mellitus with diabetic peripheral angiopathy without gangrene ([place]) 08/16/2019   COVID-19 virus infection 08/08/2019   Acute right ankle pain 07/27/2019   Tinnitus 05/26/2019   OSA (obstructive sleep apnea) 10/07/2018   Resistant hypertension 09/23/2018   Secondary hyperparathyroidism ([place]) 04/06/2018   Pituitary incidentaloma 08/10/2017   Seizure ([place])    Bilateral lower extremity edema 04/08/2017   Uncontrolled type 2 diabetes mellitus with diabetic nephropathy,  with long-term current use of insulin 03/03/2017   Chronic kidney disease (CKD), stage IV (severe) ([place]) 02/14/2014   Morbid obesity ([place]) 05/20/2013   Erectile dysfunction associated with type 2 diabetes mellitus ([place]) 06/02/2012   Macular edema 12/12/2011   Mixed hyperlipidemia due to type 2 diabetes mellitus ([place]) 11/11/2011    Past Surgical History:  Procedure Laterality Date   AV FISTULA PLACEMENT Left 11/11/2019   Procedure: LEFT FOREARM RADIOCEPHALIC ARTERIOVENOUS (AV) FISTULA CREATION;  Surgeon: [name], MD;  Location: [facility] OR;  Service: Vascular;  Laterality: Left;   Eyelid surgery Right    I & D EXTREMITY  11/11/2011   Procedure: IRRIGATION AND DEBRIDEMENT EXTREMITY;  Surgeon: Merrie Roof, MD;  Location: [facility];  Service: General;  Laterality: Right;  I&D Right Thigh Abscess   IR FLUORO GUIDE CV LINE RIGHT  08/12/2019   IR US GUIDE VASC ACCESS RIGHT  08/12/2019   REVISON OF ARTERIOVENOUS FISTULA Left 03/23/2020   Procedure: LEFT ARM ARTERIOVENOUS FISTULA REVISON, LIGATION OF SIDE BRANCHES AND ELEVATION;  Surgeon: [name], MD;  Location: [facility] OR;  Service: Vascular;  Laterality: Left;       Family History  Problem Relation Age of Onset   Diabetes Mother    Heart disease Mother 2   Diabetes Sister    Diabetes Brother    Diabetes Brother    Cancer Maternal Uncle        type unknown   Stroke Neg Hx  Social History   Tobacco Use   Smoking status: Never   Smokeless tobacco: Never  Vaping Use   Vaping Use: Never used  Substance Use Topics   Alcohol use: Not Currently   Drug use: No    Home Medications Prior to Admission medications   Medication Sig Start Date End Date Taking? Authorizing Provider  acetaminophen (TYLENOL) 500 MG tablet Take 1 tablet (500 mg total) by mouth every 6 (six) hours as needed. 05/07/21 06/06/21  Gildardo Pounds, NP  amLODipine (NORVASC) 10 MG tablet TAKE 1 TABLET BY MOUTH EVERY DAY 03/02/21   Gildardo Pounds, NP   aspirin EC 81 MG tablet Take 1 tablet (81 mg total) by mouth daily. 12/05/20   Gildardo Pounds, NP  atorvastatin (LIPITOR) 40 MG tablet Take 1 tablet (40 mg total) by mouth daily. 12/05/20 03/05/21  Gildardo Pounds, NP  Blood Glucose Monitoring Suppl (CONTOUR NEXT MONITOR) w/Device KIT 1 Device by Does not apply route 4 (four) times daily - after meals and at bedtime. 08/23/19   Mullis, Kiersten P, DO  carvedilol (COREG) 3.125 MG tablet Take 1 tablet (3.125 mg total) by mouth 2 (two) times daily with a meal. 05/07/21 08/05/21  Gildardo Pounds, NP  enalapril (VASOTEC) 5 MG tablet Take 1 tablet (5 mg total) by mouth daily. 12/05/20   Gildardo Pounds, NP  famotidine (PEPCID) 10 MG tablet Take 1 tablet (10 mg total) by mouth 2 (two) times daily. 01/01/21 01/31/21  Gildardo Pounds, NP  glucose blood test strip Use as instructed. Inject into the skin 3 times daily E11.65 11/30/19   Gildardo Pounds, NP  hydrALAZINE (APRESOLINE) 25 MG tablet TAKE 1 TABLET BY MOUTH THREE TIMES A DAY 03/02/21   Gildardo Pounds, NP  Insulin Lispro Prot & Lispro (HUMALOG MIX 75/25 KWIKPEN) (75-25) 100 UNIT/ML Kwikpen Inject 30 Units into the skin 3 (three) times daily. 04/03/21 07/02/21  Gildardo Pounds, NP  Insulin Pen Needle (B-D ULTRAFINE III SHORT PEN) 31G X 8 MM MISC 1 application by Does not apply route 3 (three) times daily. E11.65 04/03/21   Gildardo Pounds, NP  Lancet Devices (MICROLET NEXT LANCING DEVICE) MISC 1 Device by Does not apply route 3 (three) times daily. 11/30/19   Gildardo Pounds, NP  levETIRAcetam (KEPPRA XR) 500 MG 24 hr tablet Take 2 tablets (1,000 mg total) by mouth daily. 12/05/20   Gildardo Pounds, NP  lidocaine-prilocaine (EMLA) cream SMARTSIG:Sparingly Topical 02/11/20   [provider]  Microlet Lancets MISC 1 Device by Does not apply route 3 (three) times daily. E11.65 11/30/19   Gildardo Pounds, NP  omeprazole (PRILOSEC) 20 MG capsule Take 1 capsule (20 mg total) by mouth daily. 04/03/21    Gildardo Pounds, NP  Semaglutide, 1 MG/DOSE, 4 MG/3ML SOPN Inject 1 mg as directed once a week. 04/03/21 07/02/21  Gildardo Pounds, NP  traZODone (DESYREL) 150 MG tablet Take 1 tablet (150 mg total) by mouth at bedtime. 12/05/20 03/05/21  Gildardo Pounds, NP  VELPHORO 500 MG chewable tablet Chew 500 mg by mouth 3 (three) times daily. 02/07/20   [provider]    Allergies    Amlodipine  Review of Systems   Review of Systems  Constitutional:  Negative for chills and fever.  HENT:  Positive for dental problem. Negative for ear pain and sore throat.   Eyes:  Negative for pain and visual disturbance.  Respiratory:  Negative for cough  and shortness of breath.   Cardiovascular:  Negative for chest pain and palpitations.  Gastrointestinal:  Negative for abdominal pain and vomiting.  Genitourinary:  Negative for dysuria and hematuria.  Musculoskeletal:  Negative for arthralgias and back pain.  Skin:  Negative for color change and rash.  Neurological:  Negative for seizures and syncope.  All other systems reviewed and are negative.  Physical Exam Updated Vital Signs BP (!) 186/96 (BP Location: Right Arm)   Pulse 96   Temp 98.2 F (36.8 C) (Oral)   Resp 17   Ht 1.727 m ('5\' 8"' )   Wt (!) 137 kg   SpO2 97%   BMI 45.92 kg/m   Physical Exam Vitals and nursing note reviewed.  Constitutional:      Appearance: He is well-developed.  HENT:     Head: Normocephalic and atraumatic.     Mouth/Throat:     Mouth: Mucous membranes are moist.     Pharynx: No oropharyngeal exudate.     Comments: Patient with poor dentition.  Missing a lot of his incisor front teeth.  Multiple areas of decay.  Gums a little bit erythematous no purulent discharge.  Could be some component of gingivitis.  Uvula is midline. Eyes:     Conjunctiva/sclera: Conjunctivae normal.  Cardiovascular:     Rate and Rhythm: Normal rate and regular rhythm.     Heart sounds: No murmur heard. Pulmonary:     Effort:  Pulmonary effort is normal. No respiratory distress.     Breath sounds: Normal breath sounds.  Abdominal:     Palpations: Abdomen is soft.     Tenderness: There is no abdominal tenderness.  Musculoskeletal:     Cervical back: Neck supple.  Skin:    General: Skin is warm and dry.  Neurological:     General: No focal deficit present.     Mental Status: He is alert and oriented to person, place, and time.    ED Results / Procedures / Treatments   Labs (all labs ordered are listed, but only abnormal results are displayed) Labs Reviewed - No data to display  EKG None  Radiology No results found.  Procedures Procedures   Medications Ordered in ED Medications - No data to display  ED Course  I have reviewed the triage vital signs and the nursing notes.  Pertinent labs & imaging results that were available during my care of the patient were reviewed by me and considered in my medical decision making (see chart for details).    MDM Rules/Calculators/A&P                           Patient was significant dental decay some evidence of some slight gingivitis.  But his main complaint is really multiple teeth.  Will treat with penicillin short course of hydrocodone give referral to dentist.   Final Clinical Impression(s) / ED Diagnoses Final diagnoses:  Toothache    Rx / DC Orders ED Discharge Orders     None        Fredia Sorrow, MD 05/18/21 [phone]

## 2021-05-18 NOTE — Progress Notes (Signed)
Pt d/c instructions reviewed.  No questions.

## 2021-05-18 NOTE — ED Triage Notes (Signed)
Pt c/o left upper tooth pain x 2 days, no relief with BC powder or oral OTC gel

## 2021-05-21 DIAGNOSIS — N186 End stage renal disease: Secondary | ICD-10-CM | POA: Diagnosis not present

## 2021-05-21 DIAGNOSIS — Z992 Dependence on renal dialysis: Secondary | ICD-10-CM | POA: Diagnosis not present

## 2021-05-21 DIAGNOSIS — I129 Hypertensive chronic kidney disease with stage 1 through stage 4 chronic kidney disease, or unspecified chronic kidney disease: Secondary | ICD-10-CM | POA: Diagnosis not present

## 2021-06-20 DIAGNOSIS — N186 End stage renal disease: Secondary | ICD-10-CM | POA: Diagnosis not present

## 2021-06-20 DIAGNOSIS — I129 Hypertensive chronic kidney disease with stage 1 through stage 4 chronic kidney disease, or unspecified chronic kidney disease: Secondary | ICD-10-CM | POA: Diagnosis not present

## 2021-06-20 DIAGNOSIS — Z992 Dependence on renal dialysis: Secondary | ICD-10-CM | POA: Diagnosis not present

## 2021-06-27 ENCOUNTER — Ambulatory Visit: Payer: Self-pay | Admitting: *Deleted

## 2021-06-27 ENCOUNTER — Other Ambulatory Visit: Payer: Self-pay | Admitting: Nurse Practitioner

## 2021-06-27 DIAGNOSIS — E118 Type 2 diabetes mellitus with unspecified complications: Secondary | ICD-10-CM

## 2021-06-27 NOTE — Telephone Encounter (Signed)
Referral has been made to podiatry. They will call him to schedule

## 2021-06-27 NOTE — Telephone Encounter (Signed)
Per agent: "Pt called in for assistance. Pt says that he is a diabetic and has noticed that his toe nail on his big toe has turned a brownish black. Pt says that it has been like this for a few weeks and would like to know if it is a concern or not?"  Pt states nail of 2nd toe of left foot is dark brownish, onset 2 weeks ago, gradual. Does not recall any injury. Denies swelling no pain no redness or warmth, not cold to touch. No other nails affected. Pt with DM. States BS has been running high, does not share any values. States "I get tried of all these calls, do I need to see a diabetic speciality doctor or can you take care of this? "  Assured pt NT would route to practice for PCPs review. Pt hung up before care advise given. Please advise: [phone]       Reason for Disposition  [1] Diabetes mellitus AND [2] small cut (scratch) or abrasion (scrape)    Nail dark brown, no pain no open areas  Answer Assessment - Initial Assessment Questions 1. MECHANISM: "How did the injury happen?"      Unsure of injury 2. ONSET: "When did the injury happen?" (Minutes or hours ago)       3. LOCATION: "What part of the toe is injured?" "Is the nail damaged?"    4. APPEARANCE of TOE INJURY: "What does the injury look like?"     Nail of 2nd toe, left foot dark brown 5. SEVERITY: "Can you use the foot normally?" "Can you walk?"      yes 6. SIZE: For cuts, bruises, or swelling, ask: "How large is it?" (e.g., inches or centimeters;  entire toe)      NA 7. PAIN: "Is there pain?" If Yes, ask: "How bad is the pain?"   (e.g., Scale 1-10; or mild, moderate, severe)     No 8. TETANUS: For any breaks in the skin, ask: "When was the last tetanus booster?"     NA 9. DIABETES: "Do you have a history of diabetes or poor circulation in the feet?"     yes 10. OTHER SYMPTOMS: "Do you have any other symptoms?"        no  Protocols used: Toe Injury-A-AH

## 2021-06-28 NOTE — Telephone Encounter (Signed)
Called pt unable to reach or leave VM . VM tot set up

## 2021-07-04 ENCOUNTER — Other Ambulatory Visit: Payer: Self-pay

## 2021-07-04 ENCOUNTER — Ambulatory Visit (INDEPENDENT_AMBULATORY_CARE_PROVIDER_SITE_OTHER): Payer: Managed Care, Other (non HMO) | Admitting: Podiatry

## 2021-07-04 DIAGNOSIS — E1151 Type 2 diabetes mellitus with diabetic peripheral angiopathy without gangrene: Secondary | ICD-10-CM

## 2021-07-04 DIAGNOSIS — M79674 Pain in right toe(s): Secondary | ICD-10-CM

## 2021-07-04 DIAGNOSIS — M79675 Pain in left toe(s): Secondary | ICD-10-CM | POA: Diagnosis not present

## 2021-07-04 DIAGNOSIS — B351 Tinea unguium: Secondary | ICD-10-CM | POA: Diagnosis not present

## 2021-07-04 NOTE — Progress Notes (Signed)
°  Subjective:  Patient ID: Jeremy [name]., male    DOB: 1965/10/13,   MRN: [id number]  Chief Complaint  Patient presents with   Nail Problem     Lt 3rd nail discoloration, dark colored x 1 mo - no injury - no pain/redness/swelling Tx; none -FBS: not recorded A1C: 12     55 y.o. male presents for diabetic foot check . Relates some tingling on the top of his feet but denies any burning. Relates thickened elongated and painful toenails that he is unable to trim himself. Also Denies any other pedal complaints. Last A1c was 12.9 on 04/03/21.  Denies n/v/f/c.   PCP: [name] NP   Past Medical History:  Diagnosis Date   Abscess    AKI (acute kidney injury) ([place]) 08/05/2017   Bell's palsy    right eye abn since dx bell's palsy   CKD (chronic kidney disease)    Dr. McDiarmind    Dyspnea    occasional with exertion   Headache    High cholesterol    Hypertension    Left shoulder pain 10/26/2013   S/p injection on 10/26/13    Renal insufficiency 02/14/2014   Seizures ([place]) 08/04/2017   "related to high blood sugars" (08/05/2017)   Type II diabetes mellitus ([facility])     Objective:  Physical Exam: Vascular: DP/PT pulses 2/4 bilateral. CFT <3 seconds. Normal hair growth on digits. No edema.  Skin. No lacerations or abrasions bilateral feet. Nails 1-5 are thickened discolored and elongated with subungual debris.   Musculoskeletal: MMT 5/5 bilateral lower extremities in DF, PF, Inversion and Eversion. Deceased ROM in DF of ankle joint.  Neurological: Sensation intact to light touch.   Assessment:   1. Type 2 diabetes mellitus with diabetic peripheral angiopathy without gangrene, unspecified whether long term insulin use ([place])   2. Onychomycosis   3. Pain due to onychomycosis of toenails of both feet      Plan:  Patient was evaluated and treated and all questions answered. -Discussed and educated patient on diabetic foot care, especially with  regards to the vascular, neurological  and musculoskeletal systems.  -Stressed the importance of good glycemic control and the detriment of not  controlling glucose levels in relation to the foot. -Discussed supportive shoes at all times and checking feet regularly.  -Mechanically debrided all nails 1-5 bilateral using sterile nail nipper and filed with dremel without incident  -Answered all patient questions -Patient to return  in 3 months for at risk foot care -Patient advised to call the office if any problems or questions arise in the meantime.   Lorenda Peck, DPM

## 2021-07-10 ENCOUNTER — Telehealth: Payer: Self-pay | Admitting: *Deleted

## 2021-07-10 NOTE — Telephone Encounter (Signed)
Concern was addressed on 03/19/21

## 2021-07-25 ENCOUNTER — Other Ambulatory Visit: Payer: Self-pay | Admitting: Nurse Practitioner

## 2021-07-25 DIAGNOSIS — I10 Essential (primary) hypertension: Secondary | ICD-10-CM

## 2021-08-15 ENCOUNTER — Telehealth: Payer: Medicare Other | Admitting: Physician Assistant

## 2021-08-15 ENCOUNTER — Telehealth: Payer: Medicare Other | Admitting: Family

## 2021-08-15 ENCOUNTER — Ambulatory Visit: Payer: Self-pay

## 2021-08-15 DIAGNOSIS — J029 Acute pharyngitis, unspecified: Secondary | ICD-10-CM | POA: Diagnosis not present

## 2021-08-15 MED ORDER — AMOXICILLIN 500 MG PO CAPS: 500.0000 mg | ORAL_CAPSULE | Freq: Two times a day (BID) | ORAL | 0 refills | Status: AC

## 2021-08-15 NOTE — Progress Notes (Signed)
The patient no-showed for appointment despite this provider sending direct link, reaching out via phone with no response and waiting for at least 10 minutes from appointment time for patient to join. They will be marked as a NS for this appointment/time.  ? ?Jeremy [name] Jeremy Mikailah Morel, PA-C ? ? ? ?

## 2021-08-15 NOTE — Telephone Encounter (Signed)
Patient had video visit today.

## 2021-08-15 NOTE — Telephone Encounter (Signed)
Pt stated he has a sore throat, that started on Sunday and has missed dialysis twice this week because of it.   No appointments available, advised pt of mobile unit pt declined. Pt requesting medication or to be seen as soon as possible.    Pt seeking clinical advice .   Voice mailbox not set up, unable to leave message.

## 2021-08-15 NOTE — Telephone Encounter (Signed)
Patient called and advised I am able to schedule through [facility] for today at 1430 and instructions given to open email on cell phone, click the link and wait on the provider. Patient verbalized understanding and says there is someone there who will help him get on the visit.

## 2021-08-15 NOTE — Progress Notes (Signed)
Attempted to connect with patient multiple times. Will close visit.   Evelina Dun, FNP

## 2021-08-15 NOTE — Patient Instructions (Signed)
Pharyngitis °Pharyngitis is inflammation of the throat (pharynx). It is a very common cause of sore throat. Pharyngitis can be caused by a bacteria, but it is usually caused by a virus. Most cases of pharyngitis get better on their own without treatment. °What are the causes? °This condition may be caused by: °Infection by viruses (viral). Viral pharyngitis spreads easily from person to person (is contagious) through coughing, sneezing, and sharing of personal items or utensils such as cups, forks, spoons, and toothbrushes. °Infection by bacteria (bacterial). Bacterial pharyngitis may be spread by touching the nose or face after coming in contact with the bacteria, or through close contact, such as kissing. °Allergies. Allergies can cause buildup of mucus in the throat (post-nasal drip), leading to inflammation and irritation. Allergies can also cause blocked nasal passages, forcing breathing through the mouth, which dries and irritates the throat. °What increases the risk? °You are more likely to develop this condition if: °You are 5-24 years old. °You are exposed to crowded environments such as daycare, school, or dormitory living. °You live in a cold climate. °You have a weakened disease-fighting (immune) system. °What are the signs or symptoms? °Symptoms of this condition vary by the cause. Common symptoms of this condition include: °Sore throat. °Fatigue. °Low-grade fever. °Stuffy nose (nasal congestion) and cough. °Headache. °Other symptoms may include: °Glands in the neck (lymph nodes) that are swollen. °Skin rashes. °Plaque-like film on the throat or tonsils. This is often a symptom of bacterial pharyngitis. °Vomiting. °Red, itchy eyes (conjunctivitis). °Loss of appetite. °Joint pain and muscle aches. °Enlarged tonsils. °How is this diagnosed? °This condition may be diagnosed based on your medical history and a physical exam. Your health care provider will ask you questions about your illness and your  symptoms. °A swab of your throat may be done to check for bacteria (rapid strep test). Other lab tests may also be done, depending on the suspected cause, but these are rare. °How is this treated? °Many times, treatment is not needed for this condition. Pharyngitis usually gets better in 3-4 days without treatment. °Bacterial pharyngitis may be treated with antibiotic medicines. °Follow these instructions at home: °Medicines °Take over-the-counter and prescription medicines only as told by your health care provider. °If you were prescribed an antibiotic medicine, take it as told by your health care provider. Do not stop taking the antibiotic even if you start to feel better. °Use throat sprays to soothe your throat as told by your health care provider. °Children can get pharyngitis. Do not give your child aspirin because of the association with Reye's syndrome. °Managing pain °To help with pain, try: °Sipping warm liquids, such as broth, herbal tea, or warm water. °Eating or drinking cold or frozen liquids, such as frozen ice pops. °Gargling with a mixture of salt and water 3-4 times a day or as needed. To make salt water, completely dissolve ½-1 tsp (3-6 g) of salt in 1 cup (237 mL) of warm water. °Sucking on hard candy or throat lozenges. °Putting a cool-mist humidifier in your bedroom at night to moisten the air. °Sitting in the bathroom with the door closed for 5-10 minutes while you run hot water in the shower. ° °General instructions ° °Do not use any products that contain nicotine or tobacco. These products include cigarettes, chewing tobacco, and vaping devices, such as e-cigarettes. If you need help quitting, ask your health care provider. °Rest as told by your health care provider. °Drink enough fluid to keep your urine pale yellow. °How   is this prevented? °To help prevent becoming infected or spreading infection: °Wash your hands often with soap and water for at least 20 seconds. If soap and water are not  available, use hand sanitizer. °Do not touch your eyes, nose, or mouth with unwashed hands, and wash hands after touching these areas. °Do not share cups or eating utensils. °Avoid close contact with people who are sick. °Contact a health care provider if: °You have large, tender lumps in your neck. °You have a rash. °You cough up green, yellow-brown, or bloody mucus. °Get help right away if: °Your neck becomes stiff. °You drool or are unable to swallow liquids. °You cannot drink or take medicines without vomiting. °You have severe pain that does not go away, even after you take medicine. °You have trouble breathing, and it is not caused by a stuffy nose. °You have new pain and swelling in your joints such as the knees, ankles, wrists, or elbows. °These symptoms may represent a serious problem that is an emergency. Do not wait to see if the symptoms will go away. Get medical help right away. Call your local emergency services (911 in the [place] Do not drive yourself to the hospital. °Summary °Pharyngitis is redness, pain, and swelling (inflammation) of the throat (pharynx). °While pharyngitis can be caused by a bacteria, the most common causes are viral. °Most cases of pharyngitis get better on their own without treatment. °Bacterial pharyngitis is treated with antibiotic medicines. °This information is not intended to replace advice given to you by your health care provider. Make sure you discuss any questions you have with your health care provider. °Document Revised: 10/04/2020 Document Reviewed: 10/04/2020 °Elsevier Patient Education © 2022 [employer] ° °

## 2021-08-15 NOTE — Telephone Encounter (Signed)
Chief Complaint: Sore throat Symptoms: Earache Frequency: Onset Sunday, moderate, hard to swallow foods Pertinent Negatives: Patient denies fever, difficulty breathing Disposition: [] ED /[] Urgent Care (no appt availability in office) / [] Appointment(In office/virtual)/ [x]  [facility]/ [] Home Care/ [] Refused Recommended Disposition /[] [facility]/ []  Follow-up with PCP Additional Notes: [facility] scheduled today at 1215.    Summary: sore throat   Pt stated he has a sore throat, that started on Sunday and has missed dialysis twice this week because of it.   No appointments available, advised pt of mobile unit pt declined. Pt requesting medication or to be seen as soon as possible.    Pt seeking clinical advice .      Reason for Disposition  Earache also present  Answer Assessment - Initial Assessment Questions 1. ONSET: "When did the throat start hurting?" (Hours or days ago)      Sunday 2. SEVERITY: "How bad is the sore throat?" (Scale 1-10; mild, moderate or severe)   - MILD (1-3):  doesn't interfere with eating or normal activities   - MODERATE (4-7): interferes with eating some solids and normal activities   - SEVERE (8-10):  excruciating pain, interferes with most normal activities   - SEVERE DYSPHAGIA: can't swallow liquids, drooling     Moderate 3. STREP EXPOSURE: "Has there been any exposure to strep within the past week?" If Yes, ask: "What type of contact occurred?"      No 4.  VIRAL SYMPTOMS: "Are there any symptoms of a cold, such as a runny nose, cough, hoarse voice or red eyes?"      No 5. FEVER: "Do you have a fever?" If Yes, ask: "What is your temperature, how was it measured, and when did it start?"     No 6. PUS ON THE TONSILS: "Is there pus on the tonsils in the back of your throat?"     N/A 7. OTHER SYMPTOMS: "Do you have any other symptoms?" (e.g., difficulty breathing, headache, rash)     No 8. PREGNANCY: "Is there any chance you  are pregnant?" "When was your last menstrual period?"     N/A  Protocols used: Sore Throat-A-AH

## 2021-08-15 NOTE — Progress Notes (Signed)
° °  Virtual Visit  Note Due to COVID-19 pandemic this visit was conducted virtually. This visit type was conducted due to national recommendations for restrictions regarding the COVID-19 Pandemic (e.g. social distancing, sheltering in place) in an effort to limit this patient's exposure and mitigate transmission in our community. All issues noted in this document were discussed and addressed.  A physical exam was not performed with this format.  I connected with Jeremy [name]. on 08/15/21 at 3:20 pm by telephone and verified that I am speaking with the correct person using two identifiers. Jeremy [name]. is currently located at home during visit. The provider, Evelina Dun, FNP is located in their office at time of visit.  I discussed the limitations, risks, security and privacy concerns of performing an evaluation and management service by telephone and the availability of in person appointments. I also discussed with the patient that there may be a patient responsible charge related to this service. The patient expressed understanding and agreed to proceed.   History and Present Illness:  Pt calls today with sore throat that started Sunday.  Sore Throat  This is a new problem. The current episode started in the past 7 days. The problem has been gradually improving. The pain is worse on the right side. There has been no fever. The pain is at a severity of 6/10. Associated symptoms include ear pain, swollen glands and trouble swallowing. Pertinent negatives include no congestion, coughing, ear discharge, headaches or shortness of breath. He has tried nothing for the symptoms. The treatment provided no relief.     Review of Systems  HENT:  Positive for ear pain and trouble swallowing. Negative for congestion and ear discharge.   Respiratory:  Negative for cough and shortness of breath.   Neurological:  Negative for headaches.    Observations/Objective: No SOB or distress noted    Assessment and Plan: 1. Acute pharyngitis, unspecified etiology Pt will try OTC medications for a few days and if symptoms worsen or do not improve will start Amoxicillin.  - Take meds as prescribed - Use a cool mist humidifier  -Use saline nose sprays frequently -Force fluids -Throat lozenges if help -New toothbrush in 3 days Follow up if symptoms worsen or do not improve  - amoxicillin (AMOXIL) 500 MG capsule; Take 1 capsule (500 mg total) by mouth 2 (two) times daily for 10 days.  Dispense: 20 capsule; Refill: 0    I discussed the assessment and treatment plan with the patient. The patient was provided an opportunity to ask questions and all were answered. The patient agreed with the plan and demonstrated an understanding of the instructions.   The patient was advised to call back or seek an in-person evaluation if the symptoms worsen or if the condition fails to improve as anticipated.  The above assessment and management plan was discussed with the patient. The patient verbalized understanding of and has agreed to the management plan. Patient is aware to call the clinic if symptoms persist or worsen. Patient is aware when to return to the clinic for a follow-up visit. Patient educated on when it is appropriate to go to the emergency department.   Time call ended:  3:31 pm   I provided 11 minutes of  non face-to-face time during this encounter.    Evelina Dun, FNP

## 2021-08-15 NOTE — Telephone Encounter (Signed)
Patient called and says he was unable to connect to the [facility] virtual care appointment scheduled at 1215. He says he is having issues with it and wants to know if they can just call him. I advised they only do video visits. I attempted to reschedule him, but unsuccessful after 3 attempts with 3 different times. I advised I will follow up with him once I figure out about scheduling. He verbalized understanding. I offered the mobile bus, but he refused saying it's raining outside.

## 2021-08-16 ENCOUNTER — Other Ambulatory Visit: Payer: Self-pay | Admitting: Nurse Practitioner

## 2021-08-16 DIAGNOSIS — E1151 Type 2 diabetes mellitus with diabetic peripheral angiopathy without gangrene: Secondary | ICD-10-CM

## 2021-08-16 NOTE — Telephone Encounter (Signed)
Requested medication (s) are due for refill today: Yes  Requested medication (s) are on the active medication list: Yes  Last refill:  04/03/21  Future visit scheduled: Yes  Notes to clinic:  Protocol indicates pt. Needs lab work.    Requested Prescriptions  Pending Prescriptions Disp Refills   Insulin Lispro Prot & Lispro (HUMALOG MIX 75/25 KWIKPEN) (75-25) 100 UNIT/ML Kwikpen 81 mL 1    Sig: Inject 30 Units into the skin 3 (three) times daily.     Endocrinology:  Diabetes - Insulins Failed - 08/16/2021 10:01 AM      Failed - HBA1C is between 0 and 7.9 and within 180 days    HbA1c, POC (controlled diabetic range)  Date Value Ref Range Status  04/03/2021 12.9 (A) 0.0 - 7.0 % Final          Passed - Valid encounter within last 6 months    Recent Outpatient Visits           3 months ago Primary hypertension   [place], [place] W, NP   4 months ago Type 2 diabetes mellitus with diabetic peripheral angiopathy without gangrene, with long-term current use of insulin [facility])   [place], Vernia Buff, NP   7 months ago Chronic cough   [place], [place] W, NP   7 months ago Chronic cough   [place], [place] W, NP   8 months ago Type 2 diabetes mellitus with diabetic peripheral angiopathy without gangrene, with long-term current use of insulin [facility])   [place], Vernia Buff, NP       Future Appointments             In 3 weeks Gildardo Pounds, NP [place]

## 2021-08-16 NOTE — Telephone Encounter (Signed)
Medication Refill - Medication: Insulin Lispro Prot & Lispro (HUMALOG MIX 75/25 KWIKPEN) (75-25) 100 UNIT/ML Kwikpen ; Ozempic  Has the patient contacted their pharmacy? Yes.   (Agent: If no, request that the patient contact the pharmacy for the refill. If patient does not wish to contact the pharmacy document the reason why and proceed with request.) (Agent: If yes, when and what did the pharmacy advise?)  Preferred Pharmacy (with phone number or street name):  [id number] - [place], [place] Phone:  [phone]  Fax:  [phone]     Has the patient been seen for an appointment in the last year OR does the patient have an upcoming appointment? Yes.    Agent: Please be advised that RX refills may take up to 3 business days. We ask that you follow-up with your pharmacy.   PATIENT IS ON HIS LAST PEN

## 2021-08-17 ENCOUNTER — Ambulatory Visit: Payer: Self-pay | Admitting: *Deleted

## 2021-08-17 MED ORDER — HUMALOG MIX 75/25 (75-25) 100 UNIT/ML ~~LOC~~ SUSP: SUBCUTANEOUS | 2 refills | Status: DC

## 2021-08-17 NOTE — Telephone Encounter (Signed)
Reason for Disposition  [1] SEVERE pain AND [2] not improved 2 hours after taking analgesic medication (e.g., ibuprofen or acetaminophen)  Answer Assessment - Initial Assessment Questions 1. LOCATION: "Which ear is involved?"     R ear- L ear started to hurt this morning 2. ONSET: "When did the ear start hurting"      Monday 3. SEVERITY: "How bad is the pain?"  (Scale 1-10; mild, moderate or severe)   - MILD (1-3): doesn't interfere with normal activities    - MODERATE (4-7): interferes with normal activities or awakens from sleep    - SEVERE (8-10): excruciating pain, unable to do any normal activities      severe 4. URI SYMPTOMS: "Do you have a runny nose or cough?"     no 5. FEVER: "Do you have a fever?" If Yes, ask: "What is your temperature, how was it measured, and when did it start?"     no 6. CAUSE: "Have you been swimming recently?", "How often do you use Q-TIPS?", "Have you had any recent air travel or scuba diving?"     no 7. OTHER SYMPTOMS: "Do you have any other symptoms?" (e.g., headache, stiff neck, dizziness, vomiting, runny nose, decreased hearing)     Headache, sore throat 8. PREGNANCY: "Is there any chance you are pregnant?" "When was your last menstrual period?"     *No Answer*  Protocols used: Bethann Punches

## 2021-08-17 NOTE — Telephone Encounter (Signed)
°  Chief Complaint: ear pain Symptoms: ear pain, sore throat, headache Frequency: symptoms started Monday Pertinent Negatives: Patient denies fever Disposition: [] ED /[x] Urgent Care (no appt availability in office) / [] Appointment(In office/virtual)/ []  Index Virtual Care/ [] Home Care/ [] Refused Recommended Disposition /[] [place] [facility]/ []  Follow-up with PCP Additional Notes:

## 2021-08-18 ENCOUNTER — Other Ambulatory Visit: Payer: Self-pay

## 2021-08-18 ENCOUNTER — Encounter (HOSPITAL_COMMUNITY): Payer: Self-pay | Admitting: Emergency Medicine

## 2021-08-18 ENCOUNTER — Ambulatory Visit (HOSPITAL_COMMUNITY)
Admission: EM | Admit: 2021-08-18 | Discharge: 2021-08-18 | Disposition: A | Discharge status: Home / Self Care | Payer: Managed Care, Other (non HMO) | Attending: Physician Assistant | Admitting: Physician Assistant

## 2021-08-18 DIAGNOSIS — H9201 Otalgia, right ear: Secondary | ICD-10-CM | POA: Insufficient documentation

## 2021-08-18 DIAGNOSIS — I10 Essential (primary) hypertension: Secondary | ICD-10-CM | POA: Insufficient documentation

## 2021-08-18 DIAGNOSIS — H6981 Other specified disorders of Eustachian tube, right ear: Secondary | ICD-10-CM | POA: Insufficient documentation

## 2021-08-18 DIAGNOSIS — J029 Acute pharyngitis, unspecified: Secondary | ICD-10-CM | POA: Diagnosis not present

## 2021-08-18 LAB — POCT RAPID STREP A, ED / UC: Streptococcus, Group A Screen (Direct): NEGATIVE

## 2021-08-18 MED ORDER — FLUTICASONE PROPIONATE 50 MCG/ACT NA SUSP: 1.0000 | Freq: Every day | NASAL | 0 refills | Status: DC

## 2021-08-18 NOTE — Discharge Instructions (Signed)
I do not see any ear infection on exam.  You tested negative for strep.  I do not see any reason to start an antibiotic.  Please start Flonase 1 spray in each nostril twice daily for 1 week then decrease to 1 spray in each nostril daily thereafter to help with congestion and hopefully relieve some of your ear pain.  If your symptoms are not improving I recommend you follow-up with an ear nose and throat doctor.  If anything worsens and you have severe pain, difficulty swallowing, high fever, inability to hear, drainage from the ear you should be seen immediately.

## 2021-08-18 NOTE — ED Triage Notes (Signed)
Symptoms started one week ago.  In general feels ok during the day.  Patient has pain in right ear and right side of throat.

## 2021-08-18 NOTE — ED Provider Notes (Signed)
[place]    CSN: [id number] Arrival date & time: 08/18/21  1229      History   Chief Complaint Chief Complaint  Patient presents with   Sore Throat    HPI Dmarius Reeder. is a 56 y.o. male.   Patient presents today with a weeklong history of intermittent right ear pain.  He reports pain is rated 5 on a 0-10 pain scale, localized to right ear, described as aching, no aggravating or alleviating factors identified.  He reports associated right-sided sore throat but denies any fever, nasal congestion, cough, nausea, vomiting, chest pain, shortness of breath.  He did contact his PCP and had a televisit with him and they prescribed an antibiotic (amoxicillin) but he did not pick this up since he was not examined and they could not confirm what was causing his symptoms.  He denies any recent swimming, airplane travel.  He does occasionally use Q-tips.  He reports having antibiotics approximately 1 month ago for dental infection but denies additional antibiotics since then.  He has not been taking any medication for symptom management.  He has end-stage renal disease and is on dialysis Monday Wednesday Friday.  Patient blood pressure is elevated today.  He has not yet had his antihypertensive medication.  He denies any chest pain, shortness of breath, headache and vision changes.  He typically runs hypertensive.  He will go home and take his medication.   Past Medical History:  Diagnosis Date   Abscess    AKI (acute kidney injury) ([place]) 08/05/2017   Bell's palsy    right eye abn since dx bell's palsy   CKD (chronic kidney disease)    Dr. McDiarmind    Dyspnea    occasional with exertion   Headache    High cholesterol    Hypertension    Left shoulder pain 10/26/2013   S/p injection on 10/26/13    Renal insufficiency 02/14/2014   Seizures ([place]) 08/04/2017   "related to high blood sugars" (08/05/2017)   Type II diabetes mellitus ([place])     Patient Active Problem List    Diagnosis Date Noted   ESRD on dialysis ([place]) 11/09/2019   Allergy, unspecified, initial encounter 08/16/2019   Anaphylactic shock, unspecified, initial encounter 08/16/2019   Anemia in chronic kidney disease 38/46/6599   Complication of vascular dialysis catheter 08/16/2019   Fever, unspecified 08/16/2019   Iron deficiency anemia, unspecified 08/16/2019   Other specified coagulation defects ([place]) 08/16/2019   Pruritus, unspecified 08/16/2019   Shortness of breath 08/16/2019   Type 2 diabetes mellitus with diabetic peripheral angiopathy without gangrene ([place]) 08/16/2019   COVID-19 virus infection 08/08/2019   Acute right ankle pain 07/27/2019   Tinnitus 05/26/2019   OSA (obstructive sleep apnea) 10/07/2018   Resistant hypertension 09/23/2018   Secondary hyperparathyroidism ([place]) 04/06/2018   Pituitary incidentaloma 08/10/2017   Seizure ([place])    Bilateral lower extremity edema 04/08/2017   Uncontrolled type 2 diabetes mellitus with diabetic nephropathy, with long-term current use of insulin 03/03/2017   Chronic kidney disease (CKD), stage IV (severe) ([place]) 02/14/2014   Morbid obesity ([place]) 05/20/2013   Erectile dysfunction associated with type 2 diabetes mellitus ([place]) 06/02/2012   Macular edema 12/12/2011   Mixed hyperlipidemia due to type 2 diabetes mellitus ([place]) 11/11/2011    Past Surgical History:  Procedure Laterality Date   AV FISTULA PLACEMENT Left 11/11/2019   Procedure: LEFT FOREARM RADIOCEPHALIC ARTERIOVENOUS (AV) FISTULA CREATION;  Surgeon: [name], MD;  [facility]  OR;  Service: Vascular;  Laterality: Left;   Eyelid surgery Right    I & D EXTREMITY  11/11/2011   Procedure: IRRIGATION AND DEBRIDEMENT EXTREMITY;  Surgeon: Merrie Roof, MD;  Location: [place];  Service: General;  Laterality: Right;  I&D Right Thigh Abscess   IR FLUORO GUIDE CV LINE RIGHT  08/12/2019   IR US GUIDE VASC ACCESS RIGHT  08/12/2019   REVISON OF ARTERIOVENOUS FISTULA Left 03/23/2020    Procedure: LEFT ARM ARTERIOVENOUS FISTULA REVISON, LIGATION OF SIDE BRANCHES AND ELEVATION;  Surgeon: [name], MD;  Location: [place];  Service: Vascular;  Laterality: Left;       Home Medications    Prior to Admission medications   Medication Sig Start Date End Date Taking? Authorizing Provider  fluticasone (FLONASE) 50 MCG/ACT nasal spray Place 1 spray into both nostrils daily. 08/18/21  Yes Lashonna Rieke K, PA-C  amLODipine (NORVASC) 10 MG tablet TAKE 1 TABLET BY MOUTH EVERY DAY 03/02/21   Gildardo Pounds, NP  amoxicillin (AMOXIL) 500 MG capsule Take 1 capsule (500 mg total) by mouth 2 (two) times daily for 10 days. Patient not taking: Reported on 08/18/2021 08/15/21 08/25/21  Evelina Dun A, FNP  ASPIRIN CHILDRENS 81 MG chewable tablet Chew 81 mg by mouth daily. 05/11/21   [provider]  aspirin EC 81 MG tablet Take 1 tablet (81 mg total) by mouth daily. 12/05/20   Gildardo Pounds, NP  atorvastatin (LIPITOR) 40 MG tablet Take 1 tablet (40 mg total) by mouth daily. 12/05/20 03/05/21  Gildardo Pounds, NP  Blood Glucose Monitoring Suppl (CONTOUR NEXT MONITOR) w/Device KIT 1 Device by Does not apply route 4 (four) times daily - after meals and at bedtime. 08/23/19   Mullis, Kiersten P, DO  carvedilol (COREG) 3.125 MG tablet Take 1 tablet (3.125 mg total) by mouth 2 (two) times daily with a meal. 05/07/21 08/05/21  Gildardo Pounds, NP  enalapril (VASOTEC) 5 MG tablet Take 1 tablet (5 mg total) by mouth daily. 12/05/20   Gildardo Pounds, NP  famotidine (PEPCID) 10 MG tablet Take 1 tablet (10 mg total) by mouth 2 (two) times daily. 01/01/21 01/31/21  Gildardo Pounds, NP  glucose blood test strip Use as instructed. Inject into the skin 3 times daily E11.65 11/30/19   Gildardo Pounds, NP  hydrALAZINE (APRESOLINE) 25 MG tablet TAKE 1 TABLET BY MOUTH THREE TIMES A DAY 03/02/21   Gildardo Pounds, NP  HYDROcodone-acetaminophen (NORCO/VICODIN) 5-325 MG tablet Take 1 tablet by mouth  every 6 (six) hours as needed for moderate pain. 05/18/21   Fredia Sorrow, MD  insulin lispro protamine-lispro (HUMALOG MIX 75/25) (75-25) 100 UNIT/ML SUSP injection Inject 30 Units into the skin daily with breakfast AND 30 Units daily with supper. 08/17/21 11/15/21  Gildardo Pounds, NP  Insulin Pen Needle (B-D ULTRAFINE III SHORT PEN) 31G X 8 MM MISC 1 application by Does not apply route 3 (three) times daily. E11.65 04/03/21   Gildardo Pounds, NP  Lancet Devices (MICROLET NEXT LANCING DEVICE) MISC 1 Device by Does not apply route 3 (three) times daily. 11/30/19   Gildardo Pounds, NP  levETIRAcetam (KEPPRA XR) 500 MG 24 hr tablet Take 2 tablets (1,000 mg total) by mouth daily. 12/05/20   Gildardo Pounds, NP  lidocaine-prilocaine (EMLA) cream SMARTSIG:Sparingly Topical 02/11/20   [provider]  Microlet Lancets MISC 1 Device by Does not apply route 3 (three) times daily. E11.65  11/30/19   Gildardo Pounds, NP  omeprazole (PRILOSEC) 20 MG capsule Take 1 capsule (20 mg total) by mouth daily. 04/03/21   Gildardo Pounds, NP  traZODone (DESYREL) 150 MG tablet Take 1 tablet (150 mg total) by mouth at bedtime. 12/05/20 03/05/21  Gildardo Pounds, NP  VELPHORO 500 MG chewable tablet Chew 500 mg by mouth 3 (three) times daily. 02/07/20   [provider]    Family History Family History  Problem Relation Age of Onset   Diabetes Mother    Heart disease Mother 60   Diabetes Sister    Diabetes Brother    Diabetes Brother    Cancer Maternal Uncle        type unknown   Stroke Neg Hx     Social History Social History   Tobacco Use   Smoking status: Never   Smokeless tobacco: Never  Vaping Use   Vaping Use: Never used  Substance Use Topics   Alcohol use: Not Currently   Drug use: No     Allergies   Amlodipine   Review of Systems Review of Systems  Constitutional:  Positive for activity change. Negative for appetite change, fatigue and fever.  HENT:  Positive for ear pain  and sore throat. Negative for congestion, postnasal drip, sinus pressure and sneezing.   Respiratory:  Negative for cough and shortness of breath.   Cardiovascular:  Negative for chest pain.  Gastrointestinal:  Negative for abdominal pain, diarrhea, nausea and vomiting.  Neurological:  Negative for dizziness, light-headedness and headaches.    Physical Exam Triage Vital Signs ED Triage Vitals  Enc Vitals Group     BP 08/18/21 1449 (!) 171/89     Pulse Rate 08/18/21 1449 74     Resp 08/18/21 1449 18     Temp 08/18/21 1449 99 F (37.2 C)     Temp Source 08/18/21 1449 Oral     SpO2 08/18/21 1449 94 %     Weight --      Height --      Head Circumference --      Peak Flow --      Pain Score 08/18/21 1445 5     Pain Loc --      Pain Edu? --      Excl. in [place]? --    No data found.  Updated Vital Signs BP (!) 171/89 (BP Location: Right Arm) Comment: has not had medications today   Pulse 74    Temp 99 F (37.2 C) (Oral)    Resp 18    SpO2 94%   Visual Acuity Right Eye Distance:   Left Eye Distance:   Bilateral Distance:    Right Eye Near:   Left Eye Near:    Bilateral Near:     Physical Exam Vitals reviewed.  Constitutional:      General: He is awake.     Appearance: Normal appearance. He is well-developed. He is not ill-appearing.     Comments: Very pleasant male appears stated age in no acute distress sitting comfortably in exam room  HENT:     Head: Normocephalic and atraumatic.     Right Ear: Ear canal and external ear normal. Tympanic membrane is injected and retracted. Tympanic membrane is not erythematous or bulging.     Left Ear: Tympanic membrane, ear canal and external ear normal. Tympanic membrane is not erythematous or bulging.     Nose: Nose normal.     Mouth/Throat:  Pharynx: Uvula midline. Posterior oropharyngeal erythema present. No oropharyngeal exudate or uvula swelling.     Tonsils: No tonsillar exudate or tonsillar abscesses.  Cardiovascular:      Rate and Rhythm: Normal rate and regular rhythm.     Heart sounds: Normal heart sounds, S1 normal and S2 normal. No murmur heard. Pulmonary:     Effort: Pulmonary effort is normal. No accessory muscle usage or respiratory distress.     Breath sounds: Normal breath sounds. No stridor. No wheezing, rhonchi or rales.     Comments: Clear to auscultation bilaterally Abdominal:     General: Bowel sounds are normal.     Palpations: Abdomen is soft.     Tenderness: There is no abdominal tenderness.  Neurological:     Mental Status: He is alert.  Psychiatric:        Behavior: Behavior is cooperative.     UC Treatments / Results  Labs (all labs ordered are listed, but only abnormal results are displayed) Labs Reviewed  CULTURE, GROUP A STREP [facility])  POCT RAPID STREP A, ED / UC    EKG   Radiology No results found.  Procedures Procedures (including critical care time)  Medications Ordered in UC Medications - No data to display  Initial Impression / Assessment and Plan / UC Course  I have reviewed the triage vital signs and the nursing notes.  Pertinent labs & imaging results that were available during my care of the patient were reviewed by me and considered in my medical decision making (see chart for details).     No evidence of acute infection on physical exam that would warrant initiation of antibiotics.  Strep swab collected today given associated sore throat was negative.  Throat culture pending.  Discussed that symptoms could be related to eustachian tube dysfunction and recommended Flonase twice daily for 1 week then decrease to once daily thereafter.  This does not require kidney dose adjustment.  Discussed that if symptoms persist he should follow-up with ENT and was given contact information for local provider.  Recommended he call to schedule an appointment if symptoms do not improve with conservative treatment.  Discussed alarm symptoms that warrant emergent evaluation  including high fever, inability to hear out of his ear, sudden worsening of pain, otorrhea.  Strict return precautions given to which he expressed understanding.  Blood pressure is elevated but at baseline for patient.  He will go home and take his antihypertensive medication.  Recommended he monitor his blood pressure at home and keep a log for evaluation of follow-up appointment.  Discussed that if he develops any chest pain, shortness of breath, headache, vision changes in the setting of high blood pressure he needs to go to the emergency room to which he expressed understanding.  Final Clinical Impressions(s) / UC Diagnoses   Final diagnoses:  Otalgia, right  Eustachian tube dysfunction, right  Sore throat  Elevated blood pressure reading with diagnosis of hypertension     Discharge Instructions      I do not see any ear infection on exam.  You tested negative for strep.  I do not see any reason to start an antibiotic.  Please start Flonase 1 spray in each nostril twice daily for 1 week then decrease to 1 spray in each nostril daily thereafter to help with congestion and hopefully relieve some of your ear pain.  If your symptoms are not improving I recommend you follow-up with an ear nose and throat doctor.  If anything  worsens and you have severe pain, difficulty swallowing, high fever, inability to hear, drainage from the ear you should be seen immediately.     ED Prescriptions     Medication Sig Dispense Auth. Provider   fluticasone (FLONASE) 50 MCG/ACT nasal spray Place 1 spray into both nostrils daily. 16 g Kellon Chalk K, PA-C      PDMP not reviewed this encounter.   Terrilee Croak, PA-C 08/18/21 1606

## 2021-08-21 LAB — CULTURE, GROUP A STREP (THRC)

## 2021-09-03 ENCOUNTER — Ambulatory Visit: Payer: Self-pay

## 2021-09-03 NOTE — Telephone Encounter (Signed)
Pt has an app on 09/04/2021

## 2021-09-03 NOTE — Telephone Encounter (Signed)
Patient is scheduled for an appointment.

## 2021-09-03 NOTE — Telephone Encounter (Signed)
° ° °  Chief Complaint: High BP  Symptoms: Mild headache yesterday. Frequency: Started "running high about 1 month ago." Pertinent Negatives: Patient denies symptoms today Disposition: [] ED /[] Urgent Care (no appt availability in office) / [] Appointment(In office/virtual)/ []  [facility] Virtual Care/ [] Home Care/ [] Refused Recommended Disposition /[] [place] [facility]/ [x]  Follow-up with PCP Additional Notes: Pt. Currently at dialysis. Before dialysis started 198/102 and now 142/81.Would like to be seen today. Finished with dialysis at 11:00. Please advise pt. Answer Assessment - Initial Assessment Questions 1. BLOOD PRESSURE: "What is the blood pressure?" "Did you take at least two measurements 5 minutes apart?"     198/102   and now 142/81 2. ONSET: "When did you take your blood pressure?"     Now 3. HOW: "How did you obtain the blood pressure?" (e.g., visiting nurse, automatic home BP monitor)     Dialysis 4. HISTORY: "Do you have a history of high blood pressure?"     Yes 5. MEDICATIONS: "Are you taking any medications for blood pressure?" "Have you missed any doses recently?"     Yes 6. OTHER SYMPTOMS: "Do you have any symptoms?" (e.g., headache, chest pain, blurred vision, difficulty breathing, weakness)     Yesterday had mild headache 7. PREGNANCY: "Is there any chance you are pregnant?" "When was your last menstrual period?"     N/a  Protocols used: Blood Pressure - High-A-AH

## 2021-09-04 ENCOUNTER — Encounter: Payer: Self-pay | Admitting: Nurse Practitioner

## 2021-09-04 ENCOUNTER — Other Ambulatory Visit: Payer: Self-pay

## 2021-09-04 ENCOUNTER — Ambulatory Visit: Payer: Managed Care, Other (non HMO) | Attending: Nurse Practitioner | Admitting: Nurse Practitioner

## 2021-09-04 DIAGNOSIS — I10 Essential (primary) hypertension: Secondary | ICD-10-CM | POA: Diagnosis not present

## 2021-09-04 MED ORDER — CARVEDILOL 6.25 MG PO TABS: 6.2500 mg | ORAL_TABLET | Freq: Two times a day (BID) | ORAL | 0 refills | Status: DC

## 2021-09-04 NOTE — Progress Notes (Signed)
Virtual Visit via Telephone Note Due to national recommendations of social distancing due to [place] 19, telehealth visit is felt to be most appropriate for this patient at this time.  I discussed the limitations, risks, security and privacy concerns of performing an evaluation and management service by telephone and the availability of in person appointments. I also discussed with the patient that there may be a patient responsible charge related to this service. The patient expressed understanding and agreed to proceed.    I connected with Jeremy [name]. on 09/04/21  at   8:50 AM EST  EDT by telephone and verified that I am speaking with the correct person using two identifiers.  Location of Patient: Private Residence   Location of Provider: [place] and [place] participating in Telemedicine visit: [name] FNP-BC Jeremy [name].    History of Present Illness: Telemedicine visit for: Blood pressure He has a past medical history of Abscess,  Bell's palsy, CKD Dyspnea, Headache, High cholesterol, Hypertension, Left shoulder pain (10/26/2013), Renal insufficiency (02/14/2014), Seizures ([place]) (08/04/2017), and Type II diabetes mellitus, ESRD on HD M W F.   States Blood pressures are elevated prior to his HD appointments however after HD appointments blood pressures are low (SBP 100s). He is currently taking carvedilol 3.125 mg BID (was initially prescribed 6.25 mg BID), hydralazine 25 mg TID. He is not taking vasotec 5mg  daily or amlodipine 5 mg daily. It appears his HD doctor is also adjusting his blood pressure medications. He does not take his morning dose of BP medications on the days he had HD.  Notes home BP readings 170-190/100-110s BP Readings from Last 3 Encounters:  08/18/21 (!) 171/89  05/18/21 (!) 185/97  05/07/21 (!) 146/89      Past Medical History:  Diagnosis Date   Abscess    AKI (acute kidney injury) ([place]) 08/05/2017   Bell's  palsy    right eye abn since dx bell's palsy   CKD (chronic kidney disease)    Dr. McDiarmind    Dyspnea    occasional with exertion   Headache    High cholesterol    Hypertension    Left shoulder pain 10/26/2013   S/p injection on 10/26/13    Renal insufficiency 02/14/2014   Seizures ([place]) 08/04/2017   "related to high blood sugars" (08/05/2017)   Type II diabetes mellitus ([place])     Past Surgical History:  Procedure Laterality Date   AV FISTULA PLACEMENT Left 11/11/2019   Procedure: LEFT FOREARM RADIOCEPHALIC ARTERIOVENOUS (AV) FISTULA CREATION;  Surgeon: [name], MD;  Location: [place];  Service: Vascular;  Laterality: Left;   Eyelid surgery Right    I & D EXTREMITY  11/11/2011   Procedure: IRRIGATION AND DEBRIDEMENT EXTREMITY;  Surgeon: Merrie Roof, MD;  Location: [facility];  Service: General;  Laterality: Right;  I&D Right Thigh Abscess   IR FLUORO GUIDE CV LINE RIGHT  08/12/2019   IR US GUIDE VASC ACCESS RIGHT  08/12/2019   REVISON OF ARTERIOVENOUS FISTULA Left 03/23/2020   Procedure: LEFT ARM ARTERIOVENOUS FISTULA REVISON, LIGATION OF SIDE BRANCHES AND ELEVATION;  Surgeon: [name], MD;  Location: [facility];  Service: Vascular;  Laterality: Left;    Family History  Problem Relation Age of Onset   Diabetes Mother    Heart disease Mother 41   Diabetes Sister    Diabetes Brother    Diabetes Brother    Cancer Maternal Uncle  type unknown   Stroke Neg Hx     Social History   Socioeconomic History   Marital status: Divorced    Spouse name: Not on file   Number of children: 3   Years of education: Not on file   Highest education level: Not on file  Occupational History   Occupation: maintenance tech  Tobacco Use   Smoking status: Never   Smokeless tobacco: Never  Vaping Use   Vaping Use: Never used  Substance and Sexual Activity   Alcohol use: Not Currently   Drug use: No   Sexual activity: Yes  Other Topics Concern   Not on file  Social History  Narrative   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  Did not graduate high school.  Has 3 adult children in [place].  Divorced.  Lives with girlfriend [name].                   Social Determinants of Health   Financial Resource Strain: Not on file  Food Insecurity: Not on file  Transportation Needs: Not on file  Physical Activity: Not on file  Stress: Not on file  Social Connections: Not on file     Observations/Objective: Awake, alert and oriented x 3   Review of Systems  Constitutional:  Negative for fever, malaise/fatigue and weight loss.  HENT: Negative.  Negative for nosebleeds.   Eyes: Negative.  Negative for blurred vision, double vision and photophobia.  Respiratory: Negative.  Negative for cough and shortness of breath.   Cardiovascular: Negative.  Negative for chest pain, palpitations and leg swelling.  Gastrointestinal: Negative.  Negative for heartburn, nausea and vomiting.  Musculoskeletal: Negative.  Negative for myalgias.  Neurological: Negative.  Negative for dizziness, focal weakness, seizures and headaches.  Psychiatric/Behavioral: Negative.  Negative for suicidal ideas.    Assessment and Plan: Diagnoses and all orders for this visit:  Primary hypertension DOSE CHANGE-     carvedilol (COREG) 6.25 MG tablet; Take 1 tablet (6.25 mg total) by mouth 2 (two) times daily with a meal. Also instructed patient to take his amlodipine 10 mg daily as prescribed.      Follow Up Instructions Return if symptoms worsen or fail to improve.     I discussed the assessment and treatment plan with the patient. The patient was provided an opportunity to ask questions and all were answered. The patient agreed with the plan and demonstrated an understanding of the instructions.   The patient was advised to call back or seek an in-person evaluation if the symptoms worsen or if the condition fails to improve as anticipated.  I provided 13  minutes of non-face-to-face time during this encounter including median intraservice time, reviewing previous notes, labs, imaging, medications and explaining diagnosis and management.  Gildardo Pounds, FNP-BC

## 2021-09-06 ENCOUNTER — Other Ambulatory Visit: Payer: Self-pay | Admitting: Nurse Practitioner

## 2021-09-06 DIAGNOSIS — H6981 Other specified disorders of Eustachian tube, right ear: Secondary | ICD-10-CM

## 2021-09-07 ENCOUNTER — Emergency Department (HOSPITAL_COMMUNITY)
Admission: EM | Admit: 2021-09-07 | Discharge: 2021-09-07 | Disposition: A | Discharge status: Home / Self Care | Payer: Managed Care, Other (non HMO) | Attending: Emergency Medicine | Admitting: Emergency Medicine

## 2021-09-07 ENCOUNTER — Ambulatory Visit: Payer: Self-pay

## 2021-09-07 ENCOUNTER — Encounter (HOSPITAL_COMMUNITY): Payer: Self-pay | Admitting: Emergency Medicine

## 2021-09-07 ENCOUNTER — Emergency Department (HOSPITAL_COMMUNITY): Payer: Managed Care, Other (non HMO)

## 2021-09-07 DIAGNOSIS — R519 Headache, unspecified: Secondary | ICD-10-CM | POA: Insufficient documentation

## 2021-09-07 DIAGNOSIS — Z794 Long term (current) use of insulin: Secondary | ICD-10-CM | POA: Insufficient documentation

## 2021-09-07 DIAGNOSIS — I120 Hypertensive chronic kidney disease with stage 5 chronic kidney disease or end stage renal disease: Secondary | ICD-10-CM | POA: Diagnosis not present

## 2021-09-07 DIAGNOSIS — N186 End stage renal disease: Secondary | ICD-10-CM | POA: Diagnosis not present

## 2021-09-07 DIAGNOSIS — Z7982 Long term (current) use of aspirin: Secondary | ICD-10-CM | POA: Diagnosis not present

## 2021-09-07 DIAGNOSIS — Z7951 Long term (current) use of inhaled steroids: Secondary | ICD-10-CM | POA: Insufficient documentation

## 2021-09-07 DIAGNOSIS — Z992 Dependence on renal dialysis: Secondary | ICD-10-CM | POA: Diagnosis not present

## 2021-09-07 DIAGNOSIS — E1122 Type 2 diabetes mellitus with diabetic chronic kidney disease: Secondary | ICD-10-CM | POA: Diagnosis not present

## 2021-09-07 DIAGNOSIS — Z20822 Contact with and (suspected) exposure to covid-19: Secondary | ICD-10-CM | POA: Insufficient documentation

## 2021-09-07 LAB — CBC WITH DIFFERENTIAL/PLATELET
Abs Immature Granulocytes: 0.05 10*3/uL (ref 0.00–0.07)
Basophils Absolute: 0 10*3/uL (ref 0.0–0.1)
Basophils Relative: 1 %
Eosinophils Absolute: 0.1 10*3/uL (ref 0.0–0.5)
Eosinophils Relative: 1 %
HCT: 37.6 % — ABNORMAL LOW (ref 39.0–52.0)
Hemoglobin: 12.1 g/dL — ABNORMAL LOW (ref 13.0–17.0)
Immature Granulocytes: 1 %
Lymphocytes Relative: 18 %
Lymphs Abs: 1.6 10*3/uL (ref 0.7–4.0)
MCH: 26.1 pg (ref 26.0–34.0)
MCHC: 32.2 g/dL (ref 30.0–36.0)
MCV: 81.2 fL (ref 80.0–100.0)
Monocytes Absolute: 1.6 10*3/uL — ABNORMAL HIGH (ref 0.1–1.0)
Monocytes Relative: 19 %
Neutro Abs: 5.4 10*3/uL (ref 1.7–7.7)
Neutrophils Relative %: 60 %
Platelets: 227 10*3/uL (ref 150–400)
RBC: 4.63 MIL/uL (ref 4.22–5.81)
RDW: 14.2 % (ref 11.5–15.5)
WBC: 8.8 10*3/uL (ref 4.0–10.5)
nRBC: 0 % (ref 0.0–0.2)

## 2021-09-07 LAB — RESP PANEL BY RT-PCR (FLU A&B, COVID) ARPGX2
Influenza A by PCR: NEGATIVE
Influenza B by PCR: NEGATIVE
SARS Coronavirus 2 by RT PCR: NEGATIVE

## 2021-09-07 LAB — BASIC METABOLIC PANEL
Anion gap: 12 (ref 5–15)
BUN: 20 mg/dL (ref 6–20)
CO2: 31 mmol/L (ref 22–32)
Calcium: 8 mg/dL — ABNORMAL LOW (ref 8.9–10.3)
Chloride: 92 mmol/L — ABNORMAL LOW (ref 98–111)
Creatinine, Ser: 5.04 mg/dL — ABNORMAL HIGH (ref 0.61–1.24)
GFR, Estimated: 13 mL/min — ABNORMAL LOW (ref >60)
Glucose, Bld: 133 mg/dL — ABNORMAL HIGH (ref 70–99)
Potassium: 2.9 mmol/L — ABNORMAL LOW (ref 3.5–5.1)
Sodium: 135 mmol/L (ref 135–145)

## 2021-09-07 MED ORDER — METOCLOPRAMIDE HCL 5 MG/ML IJ SOLN
5.0000 mg | Freq: Once | INTRAMUSCULAR | Status: AC
  Administered 2021-09-07: 5 mg via INTRAVENOUS
  Filled 2021-09-07: qty 2

## 2021-09-07 MED ORDER — POTASSIUM CHLORIDE CRYS ER 20 MEQ PO TBCR
20.0000 meq | EXTENDED_RELEASE_TABLET | Freq: Once | ORAL | Status: AC
  Administered 2021-09-07: 20 meq via ORAL
  Filled 2021-09-07: qty 1

## 2021-09-07 MED ORDER — ACETAMINOPHEN 500 MG PO TABS
1000.0000 mg | ORAL_TABLET | Freq: Once | ORAL | Status: AC
  Administered 2021-09-07: 1000 mg via ORAL
  Filled 2021-09-07: qty 2

## 2021-09-07 MED ORDER — DIPHENHYDRAMINE HCL 50 MG/ML IJ SOLN
12.5000 mg | Freq: Once | INTRAMUSCULAR | Status: AC
  Administered 2021-09-07: 12.5 mg via INTRAVENOUS
  Filled 2021-09-07: qty 1

## 2021-09-07 NOTE — ED Triage Notes (Signed)
Pt here from dialysis ( all but 90mins of his treatment today) with c/o h/a and lightheaded feeling when he bends over , pt was recently started on an extra  b/p med this past week

## 2021-09-07 NOTE — Discharge Instructions (Signed)
You were seen in the emergency department today for headache.  As we discussed your lab work and the imaging of your head has all looked great today.  You tested negative for COVID and influenza.  Your potassium was a little low, so we gave you some oral replacement while in the emergency department.  You can continue Tylenol at home as needed.  You can take up to 1000 mg every 6 hours as needed for headache.  I like you to continue to monitor how you are doing, and return to the emergency department for any new or worsening symptoms.

## 2021-09-07 NOTE — ED Provider Notes (Signed)
[facility] EMERGENCY DEPARTMENT Provider Note   CSN: [id number] Arrival date & time: 09/07/21  0721     History  Chief Complaint  Patient presents with   Headache    Jeremy [name]. is a 56 y.o. male with history of type 2 diabetes on insulin, chronic kidney disease on dialysis MWF, and hypertension who presents the emergency department complaining of headache x3 days.  Patient states that his headache started suddenly, and is gradually worsened.  He states its mostly in the left side of his head, worse behind his left eye, and radiates into his left posterior neck.  He tried Tylenol at home with some relief.  He had about 30 minutes left of his dialysis treatment today, when he had to leave because his headache got so severe.  He denies any fever, chills, vision changes, speech changes, or weakness.  He is complaining of some tingling in his left pointer and middle fingers, but states this is normal for him after he has his dialysis access and that arm flushed.  He denies any cough or congestion.  He states that he is had headaches in the past, but has never had a headache of the severity or consistency before.   Headache Associated symptoms: numbness   Associated symptoms: no abdominal pain, no congestion, no cough, no diarrhea, no dizziness, no fever, no nausea, no seizures, no vomiting and no weakness       Home Medications Prior to Admission medications   Medication Sig Start Date End Date Taking? Authorizing Provider  amLODipine (NORVASC) 10 MG tablet TAKE 1 TABLET BY MOUTH EVERY DAY 03/02/21   Gildardo Pounds, NP  ASPIRIN CHILDRENS 81 MG chewable tablet Chew 81 mg by mouth daily. 05/11/21   [provider]  aspirin EC 81 MG tablet Take 1 tablet (81 mg total) by mouth daily. 12/05/20   Gildardo Pounds, NP  atorvastatin (LIPITOR) 40 MG tablet Take 1 tablet (40 mg total) by mouth daily. 12/05/20 03/05/21  Gildardo Pounds, NP  Blood Glucose Monitoring  Suppl (CONTOUR NEXT MONITOR) w/Device KIT 1 Device by Does not apply route 4 (four) times daily - after meals and at bedtime. 08/23/19   Mullis, Kiersten P, DO  carvedilol (COREG) 6.25 MG tablet Take 1 tablet (6.25 mg total) by mouth 2 (two) times daily with a meal. 09/04/21 12/03/21  Gildardo Pounds, NP  enalapril (VASOTEC) 5 MG tablet Take 1 tablet (5 mg total) by mouth daily. 12/05/20   Gildardo Pounds, NP  famotidine (PEPCID) 10 MG tablet Take 1 tablet (10 mg total) by mouth 2 (two) times daily. 01/01/21 01/31/21  Gildardo Pounds, NP  fluticasone (FLONASE) 50 MCG/ACT nasal spray Place 1 spray into both nostrils daily. 08/18/21   Raspet, Erin K, PA-C  glucose blood test strip Use as instructed. Inject into the skin 3 times daily E11.65 11/30/19   Gildardo Pounds, NP  hydrALAZINE (APRESOLINE) 25 MG tablet TAKE 1 TABLET BY MOUTH THREE TIMES A DAY 03/02/21   Gildardo Pounds, NP  HYDROcodone-acetaminophen (NORCO/VICODIN) 5-325 MG tablet Take 1 tablet by mouth every 6 (six) hours as needed for moderate pain. 05/18/21   Fredia Sorrow, MD  insulin lispro protamine-lispro (HUMALOG MIX 75/25) (75-25) 100 UNIT/ML SUSP injection Inject 30 Units into the skin daily with breakfast AND 30 Units daily with supper. 08/17/21 11/15/21  Gildardo Pounds, NP  Insulin Pen Needle (B-D ULTRAFINE III SHORT PEN) 31G X 8 MM MISC  1 application by Does not apply route 3 (three) times daily. E11.65 04/03/21   Gildardo Pounds, NP  Lancet Devices (MICROLET NEXT LANCING DEVICE) MISC 1 Device by Does not apply route 3 (three) times daily. 11/30/19   Gildardo Pounds, NP  levETIRAcetam (KEPPRA XR) 500 MG 24 hr tablet Take 2 tablets (1,000 mg total) by mouth daily. 12/05/20   Gildardo Pounds, NP  lidocaine-prilocaine (EMLA) cream SMARTSIG:Sparingly Topical 02/11/20   [provider]  Microlet Lancets MISC 1 Device by Does not apply route 3 (three) times daily. E11.65 11/30/19   Gildardo Pounds, NP  omeprazole (PRILOSEC) 20 MG  capsule Take 1 capsule (20 mg total) by mouth daily. 04/03/21   Gildardo Pounds, NP  traZODone (DESYREL) 150 MG tablet Take 1 tablet (150 mg total) by mouth at bedtime. 12/05/20 03/05/21  Gildardo Pounds, NP  VELPHORO 500 MG chewable tablet Chew 500 mg by mouth 3 (three) times daily. 02/07/20   [provider]      Allergies    Amlodipine    Review of Systems   Review of Systems  Constitutional:  Negative for chills and fever.  HENT:  Negative for congestion.   Eyes:  Negative for visual disturbance.  Respiratory:  Negative for cough and shortness of breath.   Cardiovascular:  Negative for chest pain.  Gastrointestinal:  Negative for abdominal pain, constipation, diarrhea, nausea and vomiting.  Neurological:  Positive for numbness and headaches. Negative for dizziness, seizures, syncope, facial asymmetry, speech difficulty, weakness and light-headedness.       Tingling in the left second and third digits  All other systems reviewed and are negative.  Physical Exam Updated Vital Signs BP (!) 165/97    Pulse 91    Resp 18    SpO2 100%  Physical Exam Vitals and nursing note reviewed.  Constitutional:      Appearance: Normal appearance.  HENT:     Head: Normocephalic and atraumatic.  Eyes:     Conjunctiva/sclera: Conjunctivae normal.  Cardiovascular:     Rate and Rhythm: Normal rate and regular rhythm.  Pulmonary:     Effort: Pulmonary effort is normal. No respiratory distress.     Breath sounds: Normal breath sounds.  Abdominal:     General: There is no distension.     Palpations: Abdomen is soft.     Tenderness: There is no abdominal tenderness.  Skin:    General: Skin is warm and dry.  Neurological:     General: No focal deficit present.     Mental Status: He is alert.     Comments: Neuro: Speech is clear, able to follow commands. CN III-XII intact grossly intact. PERRLA. EOMI. Sensation intact throughout. Str 5/5 all extremities.    ED Results / Procedures /  Treatments   Labs (all labs ordered are listed, but only abnormal results are displayed) Labs Reviewed  BASIC METABOLIC PANEL - Abnormal; Notable for the following components:      Result Value   Potassium 2.9 (*)    Chloride 92 (*)    Glucose, Bld 133 (*)    Creatinine, Ser 5.04 (*)    Calcium 8.0 (*)    GFR, Estimated 13 (*)    All other components within normal limits  CBC WITH DIFFERENTIAL/PLATELET - Abnormal; Notable for the following components:   Hemoglobin 12.1 (*)    HCT 37.6 (*)    Monocytes Absolute 1.6 (*)    All other components within normal  limits  RESP PANEL BY RT-PCR (FLU A&B, COVID) ARPGX2    EKG None  Radiology CT Head Wo Contrast  Result Date: 09/07/2021 CLINICAL DATA:  Headache. EXAM: CT HEAD WITHOUT CONTRAST TECHNIQUE: Contiguous axial images were obtained from the base of the skull through the vertex without intravenous contrast. RADIATION DOSE REDUCTION: This exam was performed according to the departmental dose-optimization program which includes automated exposure control, adjustment of the mA and/or kV according to patient size and/or use of iterative reconstruction technique. COMPARISON:  MRI of the brain 08/10/2017 FINDINGS: Brain: No evidence of acute infarction, hemorrhage, hydrocephalus, extra-axial collection or mass lesion/mass effect. Vascular: No hyperdense vessel or unexpected calcification. Skull: Normal. Negative for fracture or focal lesion. Sinuses/Orbits: No acute abnormality. Bilateral maxillary sinus retention cysts versus polyps noted Other: None IMPRESSION: 1. No acute intracranial abnormalities. 2. Bilateral maxillary sinus retention cysts versus polyps. Electronically Signed   By: Kerby Moors M.D.   On: 09/07/2021 10:55    Procedures Procedures    Medications Ordered in ED Medications  metoCLOPramide (REGLAN) injection 5 mg (5 mg Intravenous Given 09/07/21 1140)  diphenhydrAMINE (BENADRYL) injection 12.5 mg (12.5 mg Intravenous  Given 09/07/21 1140)  acetaminophen (TYLENOL) tablet 1,000 mg (1,000 mg Oral Given 09/07/21 1255)  potassium chloride SA (KLOR-CON M) CR tablet 20 mEq (20 mEq Oral Given 09/07/21 1255)    ED Course/ Medical Decision Making/ A&P                           Medical Decision Making Risk Prescription drug management.   This patient is a 56 year old male who presents to the ED for concern of headache.   Differential diagnoses prior to evaluation: The emergent differential diagnosis includes, but is not limited to, Subarachnoid hemorrhage, meningitis, temporal arteritis, glaucoma, cerebral ischemia, carotid/vertebral dissection, intracranial tumor, Venous sinus thrombosis, carbon monoxide poisoning, acute or chronic subdural hemorrhage.  Other considerations include: Migraine, Cluster headache, Hypertension, Caffeine, alcohol, or drug withdrawal, Pseudotumor cerebri, Arteriovenous malformation, Head injury, Neurocysticercosis, Post-lumbar puncture, Preeclampsia, Tension headache, Sinusitis, Cervical arthritis, Refractive error causing strain, Dental abscess, Otitis media, Temporomandibular joint syndrome, Depression, Somatoform disorder (eg, somatization) Trigeminal neuralgia, Glossopharyngeal neuralgia.  Past Medical History / Co-morbidities: History of type 2 diabetes on insulin, chronic kidney disease on dialysis MWF, hypertension  Physical Exam: Physical exam performed. The pertinent findings include: Patient is afebrile, not tachycardic, not hypoxic, no acute distress.  Normal neurologic exam as above.  Lab Tests/Imaging studies: I Ordered, and personally interpreted labs/imaging including CBC, BMP, COVID/influenza testing.  In the setting of sudden onset and severe headache in a patient > 55 year old, CT head was ordered in triage to rule out acute bleed or space-occupying lesion.The pertinent results include: No leukocytosis, Hemoglobin appears stable compared to prior, hypokalemia of 2.9. CT  head shows no acute intracranial abnormalities. I agree with the radiologist interpretation.   Medications: I ordered medication including reglan, benadryl, and Tylenol for headache.  I also ordered oral potassium replacement.  Opted to go for lower dose, as patient has decreased ability to clear potassium as he is on dialysis.  I have reviewed the patients home medicines and have made adjustments as needed.   Disposition: After consideration of the diagnostic results and the patients response to treatment, I feel that patient is not requiring admission or inpatient treatment for his symptoms.  I am reassured that he had improvement with medications given while in the ER.  He was observed  for just over 3 hours in the department, with no worsening of his symptoms.  I am also reassured by the imaging performed, that showed no acute bleed or space-occupying lesion that could be causing his symptoms.  He has no fever, no leukocytosis, infectious symptoms, and COVID/flu testing was all negative --so I have low suspicion for an infectious etiology.  He is stable for discharge to home.  Discussed reasons to return to the emergency department, and the patient is agreeable to the plan.   Final Clinical Impression(s) / ED Diagnoses Final diagnoses:  Acute intractable headache, unspecified headache type  ESRD on dialysis [facility])    Rx / DC Orders ED Discharge Orders     None      Portions of this report may have been transcribed using voice recognition software. Every effort was made to ensure accuracy; however, inadvertent computerized transcription errors may be present.    Estill Cotta 09/07/21 1323    Gareth Morgan, MD 09/07/21 [phone]

## 2021-09-07 NOTE — Telephone Encounter (Signed)
Patient is in the ED for a headache, will close out this encounter.   Summary: headache that has not gotten better in 3 days.   Pt calling in stated he has a headache that has not gotten better in 3 days. Pt is  angry because PCP appointment was not available today.    Seeking clinical advice.

## 2021-09-07 NOTE — ED Provider Triage Note (Signed)
Emergency Medicine Provider Triage Evaluation Note  Jeremy [name]. , a 56 y.o. male  was evaluated in triage.  Pt complains of headache.  Patient states that his headache started about 3 days ago.  It started suddenly and is gradually worsened.  It is behind his left eye and extends into his left posterior neck.  He states that he is taken Tylenol for his pain without relief at home.  He has a dialysis patient was at dialysis today though he had to leave early because his headache got so severe.  He now presents here in the ED.  He denies any vision changes, speech changes, numbness, weakness.  He does say he has been a little bit dizzy or lightheaded.  He denies any URI symptoms, or fevers. Of note, patient has had headaches in the past, however he has never had a headache of this severity and consistency before.  Review of Systems  Positive: Headache, dizziness Negative: Vision changes, speech changes, numbness, weakness, fevers, cough, congestion  Physical Exam  There were no vitals taken for this visit. Gen:   Awake, no distress   Resp:  Normal effort  MSK:   Moves extremities without difficulty  Other:   Alert and Oriented x 3 Speech clear with no aphasia Cranial Nerve testing - PERRLA. EOM intact. No Nystagmus - No facial asymmetry - Accessory Muscles intact Motor: - 5/5 motor strength in all four extremities.  Sensation: - Grossly intact in all four extremities.  Coordination:  - Finger to nose and heel to shin intact bilaterally   Medical Decision Making  Medically screening exam initiated at 10:11 AM.  Appropriate orders placed.  Jeremy [name]. was informed that the remainder of the evaluation will be completed by another provider, this initial triage assessment does not replace that evaluation, and the importance of remaining in the ED until their evaluation is complete.  This is a end-stage dialysis patient who has been compliant with his dialysis recently.  He did  not complete his dialysis today due to a severe headache.  Like he has had this for about 3 days now and is gradually gotten worse.  Given the new character sure of the headache, will obtain CT scan to rule out mass occupying lesion or other abnormality, however doubt intracranial bleed given the chronicity of the symptoms.   Adolphus Birchwood, PA-C 09/07/21 1014

## 2021-09-10 ENCOUNTER — Encounter: Payer: Self-pay | Admitting: Nurse Practitioner

## 2021-09-10 ENCOUNTER — Other Ambulatory Visit: Payer: Self-pay

## 2021-09-10 ENCOUNTER — Ambulatory Visit: Payer: Managed Care, Other (non HMO) | Attending: Nurse Practitioner | Admitting: Nurse Practitioner

## 2021-09-10 VITALS — BP 153/76 | HR 83 | Ht 68.0 in | Wt 316.1 lb

## 2021-09-10 DIAGNOSIS — I1A0 Resistant hypertension: Secondary | ICD-10-CM

## 2021-09-10 DIAGNOSIS — Z794 Long term (current) use of insulin: Secondary | ICD-10-CM | POA: Diagnosis not present

## 2021-09-10 DIAGNOSIS — E1151 Type 2 diabetes mellitus with diabetic peripheral angiopathy without gangrene: Secondary | ICD-10-CM

## 2021-09-10 DIAGNOSIS — G43009 Migraine without aura, not intractable, without status migrainosus: Secondary | ICD-10-CM

## 2021-09-10 DIAGNOSIS — I10 Essential (primary) hypertension: Secondary | ICD-10-CM

## 2021-09-10 DIAGNOSIS — G4733 Obstructive sleep apnea (adult) (pediatric): Secondary | ICD-10-CM

## 2021-09-10 LAB — POCT GLYCOSYLATED HEMOGLOBIN (HGB A1C): Hemoglobin A1C: 12.3 % — AB (ref 4.0–5.6)

## 2021-09-10 LAB — GLUCOSE, POCT (MANUAL RESULT ENTRY): POC Glucose: 215 mg/dl — AB (ref 70–99)

## 2021-09-10 MED ORDER — TOPIRAMATE 50 MG PO TABS: 50.0000 mg | ORAL_TABLET | Freq: Two times a day (BID) | ORAL | 1 refills | Status: DC

## 2021-09-10 NOTE — Progress Notes (Signed)
Assessment & Plan:  Jatniel was seen today for headache.  Diagnoses and all orders for this visit:  Resistant hypertension -     Ambulatory referral to Cardiology  Type 2 diabetes mellitus with diabetic peripheral angiopathy without gangrene, with long-term current use of insulin (HCC) -     POCT glycosylated hemoglobin (Hb A1C) -     POCT glucose (manual entry)  Migraine without aura and without status migrainosus, not intractable -     topiramate (TOPAMAX) 50 MG tablet; Take 1 tablet (50 mg total) by mouth 2 (two) times daily. For headaches  OSA (obstructive sleep apnea) -     PSG Sleep Study; Future    Patient has been counseled on age-appropriate routine health concerns for screening and prevention. These are reviewed and up-to-date. Referrals have been placed accordingly. Immunizations are up-to-date or declined.    Subjective:   Chief Complaint  Patient presents with   Headache   HPI [name] 56 y.o. male presents to office today for follow up to HTN.   He has a past medical history of Abscess,  Bell's palsy, CKD Dyspnea, Headache, High cholesterol, Hypertension, Left shoulder pain (10/26/2013), Renal insufficiency (02/14/2014), Seizures ([place]) (08/04/2017), and Type II diabetes mellitus, ESRD on HD [facility].  At this time he Has stopped taking all of his medications. States he has no clue what he is supposed to be taking despite the fact we have gone over all of his medications at each of his office and tele health visits. He endorses frequent headaches in the occipital  and left parietal region. Aggravating by pressure and coughing.  He has not taken any blood pressure medication since last Tuesday. Also notes hypotension during his HD days. Blood pressures are very labile. We made some changes with his blood pressure medication today. Carvedilol, enalapril and amlodipine are on hold. Notes home BP readings 170-190/100-110s. We are having a difficult time managing his  blood pressure due to soft BPs with HD. I have requested he speak with nephrology regarding his HD BPs.  At this time he will continue on hydralazine 25 mg TID.  BP Readings from Last 3 Encounters:  09/10/21 (!) 153/76  09/07/21 (!) 165/97  08/18/21 (!) 171/89     DM 2 Diabetes is oorly controlled. He is not taking his medications as prescribed and he is not diet adherent.  Lab Results  Component Value Date   HGBA1C 12.3 (A) 09/10/2021     Migraine Headaches Endorses morning headaches which have been difficult to treat. Associated symptoms nighttime awakenings, 3-4 hours of sleep at a time, snoring. Needs Sleep study.  He has been nonadherent with his CPAP so difficult to ascertain if his current CPAP settings and equipment are even beneficial at this time. He does state when he initially used it that it did provide relief of his symptoms of OSA. Current risk factors: Morbid Obesity, snoring, excessive daytime fatigue, witnessed spells of apneic episodes, HTN (poorly controlled)   Review of Systems  Constitutional:  Negative for fever, malaise/fatigue and weight loss.  HENT: Negative.  Negative for nosebleeds.   Eyes: Negative.  Negative for blurred vision, double vision and photophobia.  Respiratory: Negative.  Negative for cough and shortness of breath.   Cardiovascular: Negative.  Negative for chest pain, palpitations and leg swelling.  Gastrointestinal: Negative.  Negative for heartburn, nausea and vomiting.  Musculoskeletal: Negative.  Negative for myalgias.  Neurological:  Positive for headaches. Negative for dizziness, focal weakness  and seizures.  Psychiatric/Behavioral:  Negative for suicidal ideas. The patient has insomnia.    Past Medical History:  Diagnosis Date   Abscess    AKI (acute kidney injury) ([place]) 08/05/2017   Bell's palsy    right eye abn since dx bell's palsy   CKD (chronic kidney disease)    Dr. McDiarmind    Dyspnea    occasional with exertion    Headache    High cholesterol    Hypertension    Left shoulder pain 10/26/2013   S/p injection on 10/26/13    Renal insufficiency 02/14/2014   Seizures ([place]) 08/04/2017   "related to high blood sugars" (08/05/2017)   Type II diabetes mellitus ([place])     Past Surgical History:  Procedure Laterality Date   AV FISTULA PLACEMENT Left 11/11/2019   Procedure: LEFT FOREARM RADIOCEPHALIC ARTERIOVENOUS (AV) FISTULA CREATION;  Surgeon: [name], MD;  Location: [facility];  Service: Vascular;  Laterality: Left;   Eyelid surgery Right    I & D EXTREMITY  11/11/2011   Procedure: IRRIGATION AND DEBRIDEMENT EXTREMITY;  Surgeon: Merrie Roof, MD;  Location: [facility];  Service: General;  Laterality: Right;  I&D Right Thigh Abscess   IR FLUORO GUIDE CV LINE RIGHT  08/12/2019   IR US GUIDE VASC ACCESS RIGHT  08/12/2019   REVISON OF ARTERIOVENOUS FISTULA Left 03/23/2020   Procedure: LEFT ARM ARTERIOVENOUS FISTULA REVISON, LIGATION OF SIDE BRANCHES AND ELEVATION;  Surgeon: [name], MD;  Location: [facility];  Service: Vascular;  Laterality: Left;    Family History  Problem Relation Age of Onset   Diabetes Mother    Heart disease Mother 61   Diabetes Sister    Diabetes Brother    Diabetes Brother    Cancer Maternal Uncle        type unknown   Stroke Neg Hx     Social History Reviewed with no changes to be made today.   Outpatient Medications Prior to Visit  Medication Sig Dispense Refill   ASPIRIN CHILDRENS 81 MG chewable tablet Chew 81 mg by mouth daily.     aspirin EC 81 MG tablet Take 1 tablet (81 mg total) by mouth daily. 90 tablet 3   Blood Glucose Monitoring Suppl (CONTOUR NEXT MONITOR) w/Device KIT 1 Device by Does not apply route 4 (four) times daily - after meals and at bedtime. 1 kit 0   insulin lispro protamine-lispro (HUMALOG MIX 75/25) (75-25) 100 UNIT/ML SUSP injection Inject 30 Units into the skin daily with breakfast AND 30 Units daily with supper. 18 mL 2   Insulin Pen Needle  (B-D ULTRAFINE III SHORT PEN) 31G X 8 MM MISC 1 application by Does not apply route 3 (three) times daily. E11.65 200 each 11   Lancet Devices (MICROLET NEXT LANCING DEVICE) MISC 1 Device by Does not apply route 3 (three) times daily. 1 each 11   levETIRAcetam (KEPPRA XR) 500 MG 24 hr tablet Take 2 tablets (1,000 mg total) by mouth daily. 180 tablet 3   Microlet Lancets MISC 1 Device by Does not apply route 3 (three) times daily. E11.65 200 each 11   omeprazole (PRILOSEC) 20 MG capsule Take 1 capsule (20 mg total) by mouth daily. 30 capsule 3   VELPHORO 500 MG chewable tablet Chew 500 mg by mouth 3 (three) times daily.     amLODipine (NORVASC) 10 MG tablet TAKE 1 TABLET BY MOUTH EVERY DAY (Patient not taking: Reported on 09/11/2021) 90 tablet  0   atorvastatin (LIPITOR) 40 MG tablet Take 1 tablet (40 mg total) by mouth daily. 90 tablet 3   carvedilol (COREG) 6.25 MG tablet Take 1 tablet (6.25 mg total) by mouth 2 (two) times daily with a meal. (Patient not taking: Reported on 09/10/2021) 180 tablet 0   enalapril (VASOTEC) 5 MG tablet Take 1 tablet (5 mg total) by mouth daily. (Patient not taking: Reported on 09/11/2021) 90 tablet 1   famotidine (PEPCID) 10 MG tablet Take 1 tablet (10 mg total) by mouth 2 (two) times daily. 60 tablet 1   fluticasone (FLONASE) 50 MCG/ACT nasal spray Place 1 spray into both nostrils daily. 16 g 0   glucose blood test strip Use as instructed. Inject into the skin 3 times daily E11.65 200 each 12   hydrALAZINE (APRESOLINE) 25 MG tablet TAKE 1 TABLET BY MOUTH THREE TIMES A DAY 270 tablet 0   HYDROcodone-acetaminophen (NORCO/VICODIN) 5-325 MG tablet Take 1 tablet by mouth every 6 (six) hours as needed for moderate pain. 12 tablet 0   traZODone (DESYREL) 150 MG tablet Take 1 tablet (150 mg total) by mouth at bedtime. 90 tablet 2   lidocaine-prilocaine (EMLA) cream SMARTSIG:Sparingly Topical (Patient not taking: Reported on 09/10/2021)     No facility-administered medications  prior to visit.    Allergies  Allergen Reactions   Amlodipine Other (See Comments)    Dizziness       Objective:    BP (!) 153/76    Pulse 83    Ht '5\' 8"'  (1.727 m)    Wt (!) 316 lb 2 oz (143.4 kg)    SpO2 97%    BMI 48.07 kg/m  Wt Readings from Last 3 Encounters:  09/10/21 (!) 316 lb 2 oz (143.4 kg)  05/18/21 (!) 302 lb (137 kg)  05/07/21 (!) 306 lb 4 oz (138.9 kg)    Physical Exam Vitals and nursing note reviewed.  Constitutional:      Appearance: He is well-developed.  HENT:     Head: Normocephalic and atraumatic.  Cardiovascular:     Rate and Rhythm: Normal rate and regular rhythm.     Heart sounds: Normal heart sounds. No murmur heard.   No friction rub. No gallop.  Pulmonary:     Effort: Pulmonary effort is normal. No tachypnea or respiratory distress.     Breath sounds: Normal breath sounds. No decreased breath sounds, wheezing, rhonchi or rales.  Chest:     Chest wall: No tenderness.  Abdominal:     General: Bowel sounds are normal.     Palpations: Abdomen is soft.  Musculoskeletal:        General: Normal range of motion.     Cervical back: Normal range of motion.  Skin:    General: Skin is warm and dry.  Neurological:     Mental Status: He is alert and oriented to person, place, and time.     Coordination: Coordination normal.  Psychiatric:        Behavior: Behavior normal. Behavior is cooperative.        Thought Content: Thought content normal.        Judgment: Judgment normal.         Patient has been counseled extensively about nutrition and exercise as well as the importance of adherence with medications and regular follow-up. The patient was given clear instructions to go to ER or return to medical center if symptoms don't improve, worsen or new problems develop. The patient verbalized understanding.  Follow-up: Return for 2 weeks BP check with luke. see me in 3 months.   Gildardo Pounds, FNP-BC [facility], [place]   09/11/2021, 5:51 PM

## 2021-09-10 NOTE — Telephone Encounter (Signed)
Pt has appt today

## 2021-09-11 ENCOUNTER — Encounter: Payer: Self-pay | Admitting: Nurse Practitioner

## 2021-09-14 ENCOUNTER — Ambulatory Visit: Payer: Self-pay | Admitting: *Deleted

## 2021-09-14 ENCOUNTER — Telehealth: Payer: Self-pay

## 2021-09-14 NOTE — Telephone Encounter (Signed)
Reason for Disposition  Patient sounds very sick or weak to the triager    Pt getting dialysis when he called in c/o having a "weird chest discomfort".   He let his nurse know and she is getting someone to check on him now.  Answer Assessment - Initial Assessment Questions 1. LOCATION: "Where does it hurt?"       Pt c/o "weird chest discomfort".   It's hard to explain.   It's a weird pain in my chest.   It is intermittent.     I'm having migraines.   It's in the middle of chest straight across.   I'm having dialysis right now.   I instructed him to tell the nurse now that he is having this chest discomfort.   The headache is nagging me.     My nurse is taking someone else off the dialysis machine right now.   My treatment is about over.   BP is 159/76 now.  I just told my nurse I'm having chest discomfort.   She's getting someone to come check me now.   I let him know he may need to go to the ED but to let them check him there at the dialysis clinic.    He said,   "Ok".  We ended the call at this point since he is at the dialysis clinic now and someone is getting ready to check him due to the chest pain. 2. RADIATION: "Does the pain go anywhere else?" (e.g., into neck, jaw, arms, back)     *No Answer* 3. ONSET: "When did the chest pain begin?" (Minutes, hours or days)      *No Answer* 4. PATTERN "Does the pain come and go, or has it been constant since it started?"  "Does it get worse with exertion?"      *No Answer* 5. DURATION: "How long does it last" (e.g., seconds, minutes, hours)     *No Answer* 6. SEVERITY: "How bad is the pain?"  (e.g., Scale 1-10; mild, moderate, or severe)    - MILD (1-3): doesn't interfere with normal activities     - MODERATE (4-7): interferes with normal activities or awakens from sleep    - SEVERE (8-10): excruciating pain, unable to do any normal activities       *No Answer* 7. CARDIAC RISK FACTORS: "Do you have any history of heart problems or risk factors for  heart disease?" (e.g., angina, prior heart attack; diabetes, high blood pressure, high cholesterol, smoker, or strong family history of heart disease)     *No Answer* 8. PULMONARY RISK FACTORS: "Do you have any history of lung disease?"  (e.g., blood clots in lung, asthma, emphysema, birth control pills)     *No Answer* 9. CAUSE: "What do you think is causing the chest pain?"     *No Answer* 10. OTHER SYMPTOMS: "Do you have any other symptoms?" (e.g., dizziness, nausea, vomiting, sweating, fever, difficulty breathing, cough)       *No Answer* 11. PREGNANCY: "Is there any chance you are pregnant?" "When was your last menstrual period?"       *No Answer*  Protocols used: Chest Pain-A-AH

## 2021-09-14 NOTE — Telephone Encounter (Signed)
Noted. Sending to PCP for [facility]

## 2021-09-14 NOTE — Telephone Encounter (Signed)
°  Chief Complaint: chest pain while having dialysis  Symptoms: weird chest pain Frequency: Now Pertinent Negatives: Patient denies N/A Disposition: [x] ED /[] Urgent Care (no appt availability in office) / [] Appointment(In office/virtual)/ []  [facility]/ [] Home Care/ [] Refused Recommended Disposition /[] [facility]/ []  Follow-up with PCP Additional Notes: Staff at the dialysis clinic checking on him after I instructed him to tell his nurse he was having chest pain.   I let him know he would probably be sent to the ED.

## 2021-09-14 NOTE — Telephone Encounter (Signed)
Copied from [place] [id number]. Topic: Referral - Request for Referral >> Sep 14, 2021  9:26 AM McGill, Nelva Bush wrote: Has patient seen PCP for this complaint? Yes.   *If NO, is insurance requiring patient see PCP for this issue before PCP can refer them? Referral for which specialty:  Neurologist  Preferred provider/office: N/A Reason for referral: Pt stated he has discussed his symptoms with PCP .

## 2021-09-17 ENCOUNTER — Telehealth: Payer: Self-pay

## 2021-09-17 ENCOUNTER — Other Ambulatory Visit: Payer: Self-pay | Admitting: Nurse Practitioner

## 2021-09-17 NOTE — Telephone Encounter (Signed)
Pt called saying he called neurology and they told him if chw sends a referral they can see him before July.  Patient states he can not wait until July .  He says he needs an appt with them asap.  CB#  [phone]

## 2021-09-17 NOTE — Telephone Encounter (Signed)
Called and spoke to pt, and give him the number to the neurologist that he seen last year.

## 2021-09-17 NOTE — Telephone Encounter (Signed)
Jeremy [name] he has an appt already with Neurology in July. The appointment is under the encounters tab

## 2021-09-20 ENCOUNTER — Encounter: Payer: Self-pay | Admitting: Cardiovascular Disease

## 2021-09-20 ENCOUNTER — Ambulatory Visit (INDEPENDENT_AMBULATORY_CARE_PROVIDER_SITE_OTHER): Payer: Managed Care, Other (non HMO) | Admitting: Cardiovascular Disease

## 2021-09-20 ENCOUNTER — Other Ambulatory Visit: Payer: Self-pay

## 2021-09-20 VITALS — BP 154/84 | HR 83 | Ht 68.0 in | Wt 304.2 lb

## 2021-09-20 DIAGNOSIS — N184 Chronic kidney disease, stage 4 (severe): Secondary | ICD-10-CM

## 2021-09-20 DIAGNOSIS — I10 Essential (primary) hypertension: Secondary | ICD-10-CM

## 2021-09-20 NOTE — Progress Notes (Signed)
? ?Chief Complaint  ?Patient presents with  ? New Patient (Initial Visit)  ?  HTN  ? ?History of Present Illness: 56 yo male with history of ESRD on HD, hyperlipidemia, HTN, seizure disorder and diabetes here today as a new patient for the evaluation of HTN. He has ESRD and is on dialysis for the past three years. He has been non-compliant with his medications at times per records. His blood pressure is managed by Nephrology. He is followed by Dr. Harrie Jeans. He has no known CAD. Echo January 2019 with LVEF=50%. He tells me today that he has been having headaches since having his teeth pulled 2 weeks ago. Head CT negative in ED several days later. He has not missed dialysis. He has not taken his medications yet today. He denies any chest pain or dyspnea. No LE edema. Overall feeling well today with no headache.  ? ?Primary Care Physician: Gildardo Pounds, NP ? ? ?Past Medical History:  ?Diagnosis Date  ? Abscess   ? AKI (acute kidney injury) ([place]) 08/05/2017  ? Bell's palsy   ? right eye abn since dx bell's palsy  ? CKD (chronic kidney disease)   ? Dr. McDiarmind   ? Dyspnea   ? occasional with exertion  ? Headache   ? High cholesterol   ? Hypertension   ? Left shoulder pain 10/26/2013  ? S/p injection on 10/26/13   ? Renal insufficiency 02/14/2014  ? Seizures ([place]) 08/04/2017  ? "related to high blood sugars" (08/05/2017)  ? Type II diabetes mellitus ([place])   ? ? ?Past Surgical History:  ?Procedure Laterality Date  ? AV FISTULA PLACEMENT Left 11/11/2019  ? Procedure: LEFT FOREARM RADIOCEPHALIC ARTERIOVENOUS (AV) FISTULA CREATION;  Surgeon: [name], MD;  Location: [facility];  Service: Vascular;  Laterality: Left;  ? Eyelid surgery Right   ? I & D EXTREMITY  11/11/2011  ? Procedure: IRRIGATION AND DEBRIDEMENT EXTREMITY;  Surgeon: Merrie Roof, MD;  Location: [facility];  Service: General;  Laterality: Right;  I&D Right Thigh Abscess  ? IR FLUORO GUIDE CV LINE RIGHT  08/12/2019  ? IR US GUIDE VASC ACCESS RIGHT   08/12/2019  ? REVISON OF ARTERIOVENOUS FISTULA Left 03/23/2020  ? Procedure: LEFT ARM ARTERIOVENOUS FISTULA REVISON, LIGATION OF SIDE BRANCHES AND ELEVATION;  Surgeon: [name], MD;  Location: [facility];  Service: Vascular;  Laterality: Left;  ? ? ?Current Outpatient Medications  ?Medication Sig Dispense Refill  ? amLODipine (NORVASC) 10 MG tablet TAKE 1 TABLET BY MOUTH EVERY DAY 90 tablet 0  ? ASPIRIN CHILDRENS 81 MG chewable tablet Chew 81 mg by mouth daily.    ? aspirin EC 81 MG tablet Take 1 tablet (81 mg total) by mouth daily. 90 tablet 3  ? atorvastatin (LIPITOR) 40 MG tablet Take 1 tablet (40 mg total) by mouth daily. 90 tablet 3  ? Blood Glucose Monitoring Suppl (CONTOUR NEXT MONITOR) w/Device KIT 1 Device by Does not apply route 4 (four) times daily - after meals and at bedtime. 1 kit 0  ? carvedilol (COREG) 6.25 MG tablet Take 1 tablet (6.25 mg total) by mouth 2 (two) times daily with a meal. 180 tablet 0  ? enalapril (VASOTEC) 5 MG tablet Take 1 tablet (5 mg total) by mouth daily. 90 tablet 1  ? famotidine (PEPCID) 10 MG tablet Take 1 tablet (10 mg total) by mouth 2 (two) times daily. 60 tablet 1  ? fluticasone (FLONASE) 50 MCG/ACT nasal spray  Place 1 spray into both nostrils daily. 16 g 0  ? glucose blood test strip Use as instructed. Inject into the skin 3 times daily E11.65 200 each 12  ? hydrALAZINE (APRESOLINE) 25 MG tablet TAKE 1 TABLET BY MOUTH THREE TIMES A DAY 270 tablet 0  ? HYDROcodone-acetaminophen (NORCO/VICODIN) 5-325 MG tablet Take 1 tablet by mouth every 6 (six) hours as needed for moderate pain. 12 tablet 0  ? insulin lispro protamine-lispro (HUMALOG MIX 75/25) (75-25) 100 UNIT/ML SUSP injection Inject 30 Units into the skin daily with breakfast AND 30 Units daily with supper. 18 mL 2  ? Insulin Pen Needle (B-D ULTRAFINE III SHORT PEN) 31G X 8 MM MISC 1 application by Does not apply route 3 (three) times daily. E11.65 200 each 11  ? Lancet Devices (MICROLET NEXT LANCING DEVICE) MISC  1 Device by Does not apply route 3 (three) times daily. 1 each 11  ? levETIRAcetam (KEPPRA XR) 500 MG 24 hr tablet Take 2 tablets (1,000 mg total) by mouth daily. 180 tablet 3  ? Microlet Lancets MISC 1 Device by Does not apply route 3 (three) times daily. E11.65 200 each 11  ? omeprazole (PRILOSEC) 20 MG capsule Take 1 capsule (20 mg total) by mouth daily. 30 capsule 3  ? topiramate (TOPAMAX) 50 MG tablet Take 1 tablet (50 mg total) by mouth 2 (two) times daily. For headaches 60 tablet 1  ? traZODone (DESYREL) 150 MG tablet Take 1 tablet (150 mg total) by mouth at bedtime. 90 tablet 2  ? VELPHORO 500 MG chewable tablet Chew 500 mg by mouth 3 (three) times daily.    ? ?No current facility-administered medications for this visit.  ? ? ?Allergies  ?Allergen Reactions  ? Amlodipine Other (See Comments)  ?  Dizziness  ? ? ?Social History  ? ?Socioeconomic History  ? Marital status: Divorced  ?  Spouse name: Not on file  ? Number of children: 3  ? Years of education: Not on file  ? Highest education level: Not on file  ?Occupational History  ? Occupation: maintenance tech  ? Occupation: Disablity  ?Tobacco Use  ? Smoking status: Never  ? Smokeless tobacco: Never  ?Vaping Use  ? Vaping Use: Never used  ?Substance and Sexual Activity  ? Alcohol use: Not Currently  ? Drug use: No  ? Sexual activity: Yes  ?Other Topics Concern  ? Not on file  ?Social History Narrative  ? 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  Did not graduate high school.  Has 3 adult children in [place].  Divorced.  Lives with girlfriend [name].   ?   ?   ?   ?   ?   ? ?Social Determinants of Health  ? ?Financial Resource Strain: Not on file  ?Food Insecurity: Not on file  ?Transportation Needs: Not on file  ?Physical Activity: Not on file  ?Stress: Not on file  ?Social Connections: Not on file  ?Intimate Partner Violence: Not on file  ? ? ?Family History  ?Problem Relation Age of Onset  ? Diabetes Mother   ? Heart  disease Mother 39  ? Diabetes Sister   ? Diabetes Brother   ? Diabetes Brother   ? Cancer Maternal Uncle   ?     type unknown  ? Stroke Neg Hx   ? ? ?Review of Systems:  As stated in the HPI and otherwise negative.  ? ?BP (!) 154/84   Pulse 83  Ht '5\' 8"'  (1.727 m)   Wt (!) 304 lb 3.2 oz (138 kg)   SpO2 92%   BMI 46.25 kg/m?  ? ?Physical Examination: ?General: Well developed, well nourished, NAD  ?HEENT: OP clear, mucus membranes moist  ?SKIN: warm, dry. No rashes. ?Neuro: No focal deficits  ?Musculoskeletal: Muscle strength 5/5 all ext  ?Psychiatric: Mood and affect normal  ?Neck: No JVD, no carotid bruits, no thyromegaly, no lymphadenopathy.  ?Lungs:Clear bilaterally, no wheezes, rhonci, crackles ?Cardiovascular: Regular rate and rhythm. No murmurs, gallops or rubs. ?Abdomen:Soft. Bowel sounds present. Non-tender.  ?Extremities: No lower extremity edema. Pulses are 2 + in the bilateral DP/PT. ? ?EKG:  EKG is ordered today. ?The ekg ordered today demonstrates Sinus, RBBB ? ?Recent Labs: ?02/23/2021: ALT 11 ?09/07/2021: BUN 20; Creatinine, Ser 5.04; Hemoglobin 12.1; Platelets 227; Potassium 2.9; Sodium 135  ?  ?Wt Readings from Last 3 Encounters:  ?09/20/21 (!) 304 lb 3.2 oz (138 kg)  ?09/10/21 (!) 316 lb 2 oz (143.4 kg)  ?05/18/21 (!) 302 lb (137 kg)  ?  ?Assessment and Plan:  ? ?1. HTN: His BP seems to have been reasonable lately while at dialysis. I have told him today that I would prefer to have his Nephrologist make changes in his BP medications as he is on dialysis. He is encouraged to take his medications as prescribed. Continue current therapy.  ? ?Labs/ tests ordered today include:  ? ?Orders Placed This Encounter  ?Procedures  ? EKG 12-Lead  ? ?Disposition:   F/U with me as needed.  ? ?Signed, ?Lauree Chandler, MD ?09/20/2021 11:43 AM    ?[facility] ?[place], [place], [place]  [address] ?Phone: [phone]; Fax: [phone]  ? ? ?

## 2021-09-20 NOTE — Patient Instructions (Signed)
Medication Instructions:  ?Your physician recommends that you continue on your current medications as directed. Please refer to the Current Medication list given to you today. ? ?*If you need a refill on your cardiac medications before your next appointment, please call your pharmacy* ? ? ?Lab Work: ?NONE ?If you have labs (blood work) drawn today and your tests are completely normal, you will receive your results only by: ?MyChart Message (if you have MyChart) OR ?A paper copy in the mail ?If you have any lab test that is abnormal or we need to change your treatment, we will call you to review the results. ? ? ?Testing/Procedures: ?NONE ? ? ?Follow-Up: ?As needed ? ?We recommend signing up for the patient portal called "MyChart".  Sign up information is provided on this After Visit Summary.  MyChart is used to connect with patients for Virtual Visits (Telemedicine).  Patients are able to view lab/test results, encounter notes, upcoming appointments, etc.  Non-urgent messages can be sent to your provider as well.   ?To learn more about what you can do with MyChart, go to [email or web address].   ? ? ?Provider:   ?McAlhany, C ? ?  ?

## 2021-09-28 ENCOUNTER — Telehealth: Payer: Self-pay

## 2021-09-28 NOTE — Telephone Encounter (Signed)
PA for Ozempic approved until 09/28/2022 ?

## 2021-10-02 ENCOUNTER — Ambulatory Visit: Payer: Managed Care, Other (non HMO) | Attending: Nurse Practitioner | Admitting: Pharmacist

## 2021-10-02 ENCOUNTER — Other Ambulatory Visit: Payer: Self-pay | Admitting: Nurse Practitioner

## 2021-10-02 ENCOUNTER — Other Ambulatory Visit: Payer: Self-pay

## 2021-10-02 DIAGNOSIS — G43009 Migraine without aura, not intractable, without status migrainosus: Secondary | ICD-10-CM

## 2021-10-02 DIAGNOSIS — E1151 Type 2 diabetes mellitus with diabetic peripheral angiopathy without gangrene: Secondary | ICD-10-CM | POA: Diagnosis not present

## 2021-10-02 DIAGNOSIS — Z794 Long term (current) use of insulin: Secondary | ICD-10-CM

## 2021-10-02 MED ORDER — BD PEN NEEDLE SHORT U/F 31G X 8 MM MISC: 1.0000 "application " | Freq: Three times a day (TID) | 11 refills | Status: DC

## 2021-10-02 NOTE — Telephone Encounter (Signed)
Requested Prescriptions  ?Pending Prescriptions Disp Refills  ?? topiramate (TOPAMAX) 50 MG tablet [Pharmacy Med Name: TOPIRAMATE 50 MG TABLET] 60 tablet 0  ?  Sig: TAKE 1 TABLET (50 MG TOTAL) BY MOUTH 2 (TWO) TIMES DAILY. FOR HEADACHES  ?  ? Neurology: Anticonvulsants - topiramate & zonisamide Failed - 10/02/2021 12:33 PM  ?  ?  Failed - Cr in normal range and within 360 days  ?  Creat  ?Date Value Ref Range Status  ?05/31/2016 2.77 (H) 0.60 - 1.35 mg/dL Final  ? ?Creatinine, Ser  ?Date Value Ref Range Status  ?09/07/2021 5.04 (H) 0.61 - 1.24 mg/dL Final  ? ?Creatinine, Urine  ?Date Value Ref Range Status  ?05/31/2016 52 20 - 370 mg/dL Final  ?   ?  ?  Failed - AST in normal range and within 360 days  ?  AST  ?Date Value Ref Range Status  ?02/23/2021 10 (L) 15 - 41 U/L Final  ?   ?  ?  Passed - CO2 in normal range and within 360 days  ?  CO2  ?Date Value Ref Range Status  ?09/07/2021 31 22 - 32 mmol/L Final  ? ?Bicarbonate  ?Date Value Ref Range Status  ?08/09/2017 21.2 20.0 - 28.0 mmol/L Final  ?   ?  ?  Passed - ALT in normal range and within 360 days  ?  ALT  ?Date Value Ref Range Status  ?02/23/2021 11 0 - 44 U/L Final  ?   ?  ?  Passed - Completed PHQ-2 or PHQ-9 in the last 360 days  ?  ?  Passed - Valid encounter within last 12 months  ?  Recent Outpatient Visits   ?      ? Today Type 2 diabetes mellitus with diabetic peripheral angiopathy without gangrene, with long-term current use of insulin ([place])  ? [place], RPH-CPP  ? 3 weeks ago Resistant hypertension  ? [place], [place] W, NP  ? 4 weeks ago Primary hypertension  ? [place], [place] W, NP  ? 4 months ago Primary hypertension  ? [place], [place] W, NP  ? 6 months ago Type 2 diabetes mellitus with diabetic peripheral angiopathy without gangrene, with long-term current use of insulin  ([place])  ? [place] Gildardo Pounds, NP  ?  ?  ?Future Appointments   ?        ? In 2 months Gildardo Pounds, NP [place]  ?  ? ?  ?  ?  ? ?

## 2021-10-02 NOTE — Progress Notes (Signed)
? ?S:    ?PCP: Zelda  ? ?Jeremy [name]. is a 57 y.o. male who presents for hypertension evaluation, education, and management. PMH is significant for resistant HTN, T2DM with diabetic peripheral angiopathy, HLD, secondary hyperparathyroidism, ESRD on MWF dialysis. Patient was referred and last seen by Primary Care Provider, [name], on 09/10/2021.  ? ?Today, He arrives in good spirits and presents without assistance. Denies dizziness, headache, blurred vision, swelling. Takes Aleve, BC pain reliever prn for HA.   ? ?Patient reports hypertension is longstanding. ? ?Family/Social history:  ?Fhx: DM, HTN, stroke ?Tobacco: none  ?Alcohol: none  ? ?Medication adherence: "I don't take my BP medications like I should." Patient has taken BP medications today.  ? ?Current antihypertensives include:  amlodipine 10 mg daily, hydralazine 25 mg TID, carvedilol 6.25 mg BID (not taking), enalapril 5 mg daily  ? ?Reported home BP readings:  ?-120/67 mmHg this morning  ? ?Patient reported dietary habits:  ?-"Cut way back" on sodium ?- No salt at the table ? ?Patient-reported exercise habits:  ?-Walks from his house to the end of his street ?-Usually takes him 10 minutes (limited d/t longstanding dyspnea S/p covid infection 2020-21) ? ?O:  ?Vitals:  ? 10/02/21 1419  ?BP: 132/79  ?Pulse: 88  ? ? ? ?Last 3 Office BP readings: ?BP Readings from Last 3 Encounters:  ?10/02/21 132/79  ?09/20/21 (!) 154/84  ?09/10/21 (!) 153/76  ? ? ?BMET ?   ?Component Value Date/Time  ? NA 135 09/07/2021 1016  ? NA 142 12/05/2020 1604  ? K 2.9 (L) 09/07/2021 1016  ? CL 92 (L) 09/07/2021 1016  ? CO2 31 09/07/2021 1016  ? GLUCOSE 133 (H) 09/07/2021 1016  ? BUN 20 09/07/2021 1016  ? BUN 66 (H) 12/05/2020 1604  ? CREATININE 5.04 (H) 09/07/2021 1016  ? CREATININE 2.77 (H) 05/31/2016 0844  ? CALCIUM 8.0 (L) 09/07/2021 1016  ? GFRNONAA 13 (L) 09/07/2021 1016  ? GFRNONAA 26 (L) 05/31/2016 0844  ? GFRAA 15 (L) 08/18/2020 0925  ? GFRAA 30 (L) 05/31/2016  0844  ? ? ?Renal function: ?CrCl cannot be calculated (Patient's most recent lab result is older than the maximum 21 days allowed.). ? ?Clinical ASCVD: No  ?The 10-year ASCVD risk score (Arnett DK, et al., 2019) is: 18.6% ?  Values used to calculate the score: ?    Age: 38 years ?    Sex: Male ?    Is Non-Hispanic African American: Yes ?    Diabetic: Yes ?    Tobacco smoker: No ?    Systolic Blood Pressure: 272 mmHg ?    Is BP treated: Yes ?    HDL Cholesterol: 60 mg/dL ?    Total Cholesterol: 180 mg/dL ? ? ?A/P: ?Hypertension diagnosed currently at goal on current medications. BP goal < 130/80 mmHg. Medication adherence appears okay. It is difficult to recommend changes as he sometimes self-doses depending on BP before and after dialysis. Will defer further changes to Nephrology as his BP looks good.  ?-Continued current regimen.   ?-Advised pt against taking Aleve or BC powders.  ?-Counseled on lifestyle modifications for blood pressure control including reduced dietary sodium, increased exercise, adequate sleep. ?-Encouraged patient to check BP at home and bring log of readings to next visit. Counseled on proper use of home BP cuff.  ? ?Results reviewed and written information provided. Patient verbalized understanding of treatment plan. Total time in face-to-face counseling 30 minutes.  ? ?F/u clinic visit with  PCP. ? ?Benard Halsted, PharmD, BCACP, CPP ?Clinical Pharmacist ?[facility] ?[phone] ? ?

## 2021-10-05 DIAGNOSIS — Z4802 Encounter for removal of sutures: Secondary | ICD-10-CM | POA: Insufficient documentation

## 2021-10-10 ENCOUNTER — Other Ambulatory Visit: Payer: Self-pay

## 2021-10-10 ENCOUNTER — Ambulatory Visit (INDEPENDENT_AMBULATORY_CARE_PROVIDER_SITE_OTHER): Payer: Managed Care, Other (non HMO) | Admitting: Podiatry

## 2021-10-10 DIAGNOSIS — Z992 Dependence on renal dialysis: Secondary | ICD-10-CM

## 2021-10-10 DIAGNOSIS — M79674 Pain in right toe(s): Secondary | ICD-10-CM

## 2021-10-10 DIAGNOSIS — M79675 Pain in left toe(s): Secondary | ICD-10-CM

## 2021-10-10 DIAGNOSIS — B351 Tinea unguium: Secondary | ICD-10-CM

## 2021-10-10 DIAGNOSIS — N186 End stage renal disease: Secondary | ICD-10-CM

## 2021-10-10 DIAGNOSIS — E0822 Diabetes mellitus due to underlying condition with diabetic chronic kidney disease: Secondary | ICD-10-CM

## 2021-10-10 DIAGNOSIS — Z794 Long term (current) use of insulin: Secondary | ICD-10-CM

## 2021-10-16 ENCOUNTER — Other Ambulatory Visit: Payer: Self-pay | Admitting: Nurse Practitioner

## 2021-10-16 ENCOUNTER — Telehealth: Payer: Self-pay | Admitting: Nurse Practitioner

## 2021-10-16 DIAGNOSIS — G43009 Migraine without aura, not intractable, without status migrainosus: Secondary | ICD-10-CM

## 2021-10-16 NOTE — Telephone Encounter (Signed)
Copied from [place] [id number]. Topic: General - Inquiry >> Oct 16, 2021  1:14 PM Greggory Keen D wrote: Reason for CRM: Terri with [facility] calling to let provider know pt's ins will not cover sleep study.  She will send the fax.

## 2021-10-16 NOTE — Telephone Encounter (Signed)
Patients insurance will not cover sleep study, please look out for fax containing details and next steps for patient. ?

## 2021-10-17 ENCOUNTER — Other Ambulatory Visit: Payer: Self-pay | Admitting: Nurse Practitioner

## 2021-10-17 DIAGNOSIS — G4733 Obstructive sleep apnea (adult) (pediatric): Secondary | ICD-10-CM

## 2021-10-18 ENCOUNTER — Encounter: Payer: Self-pay | Admitting: Podiatry

## 2021-10-18 NOTE — Progress Notes (Signed)
?  Subjective:  ?Patient ID: Jeremy [name]., male    DOB: April 23, 1966,  MRN: [id number] ? ?Jeremy [name]. presents to clinic today for at risk foot care with h/o NIDDM with ESRD on hemodialysis and painful elongated mycotic toenails 1-5 bilaterally which are tender when wearing enclosed shoe gear. Pain is relieved with periodic professional debridement. ? ?Last HgA1c was 11.5%. Patient did not check blood glucose today. ? ?New problem(s): None.  ? ?PCP is Gildardo Pounds, NP , and last visit was September 10, 2021. ? ?Allergies  ?Allergen Reactions  ? Amlodipine Other (See Comments)  ?  Dizziness  ? ? ?Review of Systems: Negative except as noted in the HPI. ? ?Objective:  ? ?Objective:  ?There were no vitals filed for this visit. ?Constitutional Patient is a pleasant 56 y.o. African American male morbidly obese in NAD. AAO x 3.  ?Vascular CFT <3 seconds b/l LE. Palpable DP pulse(s) b/l LE. Palpable PT pulse(s) b/l LE. Pedal hair sparse. No pain with calf compression b/l. Lower extremity skin temperature gradient within normal limits. No edema noted b/l LE. No cyanosis or clubbing noted.  ?Neurologic Normal speech. Protective sensation intact 5/5 intact bilaterally with 10g monofilament b/l.  ?Dermatologic Pedal integument with normal turgor, texture and tone BLE. No open wounds b/l LE. No interdigital macerations noted b/l LE. Toenails 1-5 b/l elongated, discolored, dystrophic, thickened, crumbly with subungual debris and tenderness to dorsal palpation. No hyperkeratotic nor porokeratotic lesions present on today's visit.  ?Orthopedic: Muscle strength 5/5 to all lower extremity muscle groups bilaterally. No pain, crepitus or joint limitation noted with ROM bilateral LE. No gross bony deformities bilaterally.  ? ? ?  Latest Ref Rng & Units 09/10/2021  ?  3:31 PM 04/03/2021  ? 11:44 AM 12/05/2020  ?  3:35 PM  ?Hemoglobin A1C  ?Hemoglobin-A1c 4.0 - 5.6 % 12.3   12.9   11.7    ? ?Assessment:  ? ?1. Pain due to  onychomycosis of toenails of both feet   ?2. Diabetes mellitus due to underlying condition with chronic kidney disease on chronic dialysis, with long-term current use of insulin ([place])   ? ?Plan:  ?Patient was evaluated and treated and all questions answered. ? ?Onychomycosis with pain ?-Nails palliatively debridement as below. ?-Educated on self-care ? ?Procedure: Nail Debridement ?Rationale: Pain ?Type of Debridement: manual, sharp debridement. ?Instrumentation: Nail nipper, rotary burr. ?Number of Nails: 10 ? ?-Continue foot and shoe inspections daily. Monitor blood glucose per PCP/Endocrinologist's recommendations. ?-Mycotic toenails 1-5 bilaterally were debrided in length and girth with sterile nail nippers and dremel without incident. ?-Patient/POA to call should there be question/concern in the interim. ? ?Return in about 3 months (around 01/10/2022). ?

## 2021-11-12 ENCOUNTER — Other Ambulatory Visit: Payer: Self-pay | Admitting: Nurse Practitioner

## 2021-11-12 DIAGNOSIS — I10 Essential (primary) hypertension: Secondary | ICD-10-CM

## 2021-11-13 NOTE — Telephone Encounter (Signed)
Requested Prescriptions  ?Pending Prescriptions Disp Refills  ?? carvedilol (COREG) 3.125 MG tablet [Pharmacy Med Name: CARVEDILOL 3.125 MG TABLET] 180 tablet 1  ?  Sig: TAKE 1 TABLET (3.125 MG TOTAL) BY MOUTH 2 (TWO) TIMES DAILY WITH A MEAL.  ?  ? Cardiovascular: Beta Blockers 3 Failed - 11/12/2021 12:12 AM  ?  ?  Failed - Cr in normal range and within 360 days  ?  Creat  ?Date Value Ref Range Status  ?05/31/2016 2.77 (H) 0.60 - 1.35 mg/dL Final  ? ?Creatinine, Ser  ?Date Value Ref Range Status  ?09/07/2021 5.04 (H) 0.61 - 1.24 mg/dL Final  ? ?Creatinine, Urine  ?Date Value Ref Range Status  ?05/31/2016 52 20 - 370 mg/dL Final  ?   ?  ?  Failed - AST in normal range and within 360 days  ?  AST  ?Date Value Ref Range Status  ?02/23/2021 10 (L) 15 - 41 U/L Final  ?   ?  ?  Passed - ALT in normal range and within 360 days  ?  ALT  ?Date Value Ref Range Status  ?02/23/2021 11 0 - 44 U/L Final  ?   ?  ?  Passed - Last BP in normal range  ?  BP Readings from Last 1 Encounters:  ?10/02/21 132/79  ?   ?  ?  Passed - Last Heart Rate in normal range  ?  Pulse Readings from Last 1 Encounters:  ?10/02/21 88  ?   ?  ?  Passed - Valid encounter within last 6 months  ?  Recent Outpatient Visits   ?      ? 1 month ago Type 2 diabetes mellitus with diabetic peripheral angiopathy without gangrene, with long-term current use of insulin ([place])  ? [place], RPH-CPP  ? 2 months ago Resistant hypertension  ? [place], [place] W, NP  ? 2 months ago Primary hypertension  ? [place], [place] W, NP  ? 6 months ago Primary hypertension  ? [place], [place] W, NP  ? 7 months ago Type 2 diabetes mellitus with diabetic peripheral angiopathy without gangrene, with long-term current use of insulin ([place])  ? [place] Gildardo Pounds, NP  ?  ?   ?Future Appointments   ?        ? In 4 weeks Gildardo Pounds, NP [place]  ?  ? ?  ?  ?  ? ? ?

## 2021-12-12 ENCOUNTER — Ambulatory Visit: Payer: Managed Care, Other (non HMO) | Admitting: Nurse Practitioner

## 2021-12-14 ENCOUNTER — Other Ambulatory Visit: Payer: Self-pay | Admitting: Nurse Practitioner

## 2021-12-14 DIAGNOSIS — Z87898 Personal history of other specified conditions: Secondary | ICD-10-CM

## 2021-12-14 DIAGNOSIS — D688 Other specified coagulation defects: Secondary | ICD-10-CM

## 2021-12-31 DIAGNOSIS — R52 Pain, unspecified: Secondary | ICD-10-CM | POA: Insufficient documentation

## 2022-01-15 ENCOUNTER — Encounter: Payer: Self-pay | Admitting: Podiatry

## 2022-01-15 ENCOUNTER — Ambulatory Visit (INDEPENDENT_AMBULATORY_CARE_PROVIDER_SITE_OTHER): Payer: Managed Care, Other (non HMO) | Admitting: Podiatry

## 2022-01-15 DIAGNOSIS — Z794 Long term (current) use of insulin: Secondary | ICD-10-CM

## 2022-01-15 DIAGNOSIS — N186 End stage renal disease: Secondary | ICD-10-CM

## 2022-01-15 DIAGNOSIS — B351 Tinea unguium: Secondary | ICD-10-CM | POA: Diagnosis not present

## 2022-01-15 DIAGNOSIS — E0822 Diabetes mellitus due to underlying condition with diabetic chronic kidney disease: Secondary | ICD-10-CM

## 2022-01-15 DIAGNOSIS — M79675 Pain in left toe(s): Secondary | ICD-10-CM | POA: Diagnosis not present

## 2022-01-15 DIAGNOSIS — Z992 Dependence on renal dialysis: Secondary | ICD-10-CM

## 2022-01-15 DIAGNOSIS — M79674 Pain in right toe(s): Secondary | ICD-10-CM

## 2022-01-21 ENCOUNTER — Encounter: Payer: Self-pay | Admitting: Physician Assistant

## 2022-01-21 ENCOUNTER — Ambulatory Visit: Payer: Self-pay

## 2022-01-21 ENCOUNTER — Ambulatory Visit (INDEPENDENT_AMBULATORY_CARE_PROVIDER_SITE_OTHER): Payer: Managed Care, Other (non HMO) | Admitting: Physician Assistant

## 2022-01-21 VITALS — BP 164/94 | HR 93 | Resp 18 | Ht 68.0 in | Wt 304.0 lb

## 2022-01-21 DIAGNOSIS — M5441 Lumbago with sciatica, right side: Secondary | ICD-10-CM | POA: Diagnosis not present

## 2022-01-21 MED ORDER — KETOROLAC TROMETHAMINE 60 MG/2ML IM SOLN
60.0000 mg | Freq: Once | INTRAMUSCULAR | Status: AC
  Administered 2022-01-21: 60 mg via INTRAMUSCULAR

## 2022-01-21 MED ORDER — CYCLOBENZAPRINE HCL 10 MG PO TABS: 10.0000 mg | ORAL_TABLET | Freq: Three times a day (TID) | ORAL | 0 refills | Status: DC | PRN

## 2022-01-21 MED ORDER — METHYLPREDNISOLONE SODIUM SUCC 125 MG IJ SOLR
125.0000 mg | Freq: Once | INTRAMUSCULAR | Status: AC
  Administered 2022-01-21: 125 mg via INTRAMUSCULAR

## 2022-01-21 NOTE — Progress Notes (Signed)
Established Patient Office Visit  Subjective   Patient ID: Jeremy [name]., male    DOB: 1966-05-21  Age: 56 y.o. MRN: [id number]  Chief Complaint  Patient presents with   Back Pain    Started 1 month ago    States that he has been having low back pain that has been radiating down his right leg for the past month.  States that it started after he did heavy lifting, otherwise denies injury or trauma.  Denies history of back pain.  Denies numbness or tingling, denies saddle anesthesia.  States that he has been using IcyHot, Tylenol, states that these offer relief for a short time     Past Medical History:  Diagnosis Date   Abscess    AKI (acute kidney injury) ([place]) 08/05/2017   Bell's palsy    right eye abn since dx bell's palsy   CKD (chronic kidney disease)    Dr. McDiarmind    Dyspnea    occasional with exertion   Headache    High cholesterol    Hypertension    Left shoulder pain 10/26/2013   S/p injection on 10/26/13    Renal insufficiency 02/14/2014   Seizures ([place]) 08/04/2017   "related to high blood sugars" (08/05/2017)   Type II diabetes mellitus ([place])    Social History   Socioeconomic History   Marital status: Divorced    Spouse name: Not on file   Number of children: 3   Years of education: Not on file   Highest education level: Not on file  Occupational History   Occupation: maintenance tech   Occupation: [employer]  Tobacco Use   Smoking status: Never   Smokeless tobacco: Never  Vaping Use   Vaping Use: Never used  Substance and Sexual Activity   Alcohol use: Not Currently   Drug use: No   Sexual activity: Yes  Other Topics Concern   Not on file  Social History Narrative   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  Did not graduate high school.  Has 3 adult children in [place].  Divorced.  Lives with girlfriend Jeremy [name]).                   Social Determinants of Health   Financial Resource Strain: Not on  file  Food Insecurity: Not on file  Transportation Needs: Not on file  Physical Activity: Unknown (08/31/2019)   Exercise Vital Sign    Days of Exercise per Week: 2 days    Minutes of Exercise per Session: Not on file  Stress: Not on file  Social Connections: Not on file  Intimate Partner Violence: Not on file   Family History  Problem Relation Age of Onset   Diabetes Mother    Heart disease Mother 68   Diabetes Sister    Diabetes Brother    Diabetes Brother    Cancer Maternal Uncle        type unknown   Stroke Neg Hx    Allergies  Allergen Reactions   Amlodipine Other (See Comments)    Dizziness    Review of Systems  Constitutional: Negative.   HENT: Negative.    Eyes: Negative.   Respiratory:  Negative for shortness of breath.   Cardiovascular:  Negative for chest pain.  Gastrointestinal:  Negative for nausea and vomiting.  Genitourinary:  Negative for dysuria and hematuria.  Musculoskeletal:  Positive for back pain and myalgias.  Skin: Negative.   Neurological: Negative.  Endo/Heme/Allergies: Negative.   Psychiatric/Behavioral: Negative.        Objective:     BP (!) 164/94 (BP Location: Right Arm, Patient Position: Sitting, Cuff Size: Large)   Pulse 93   Resp 18   Ht '5\' 8"'$  (1.727 m)   Wt (!) 304 lb (137.9 kg)   SpO2 96%   BMI 46.22 kg/m    Physical Exam Vitals and nursing note reviewed.  Constitutional:      Appearance: Normal appearance. He is obese.  HENT:     Head: Normocephalic and atraumatic.     Right Ear: External ear normal.     Left Ear: External ear normal.     Nose: Nose normal.     Mouth/Throat:     Mouth: Mucous membranes are moist.     Pharynx: Oropharynx is clear.  Eyes:     Extraocular Movements: Extraocular movements intact.     Conjunctiva/sclera: Conjunctivae normal.     Pupils: Pupils are equal, round, and reactive to light.  Cardiovascular:     Rate and Rhythm: Normal rate and regular rhythm.     Pulses: Normal pulses.           Dorsalis pedis pulses are 2+ on the right side and 2+ on the left side.       Posterior tibial pulses are 2+ on the right side and 2+ on the left side.     Heart sounds: Normal heart sounds.  Pulmonary:     Effort: Pulmonary effort is normal.     Breath sounds: Normal breath sounds.  Musculoskeletal:     Cervical back: Normal, normal range of motion and neck supple.     Thoracic back: No swelling. Decreased range of motion.     Lumbar back: Tenderness present. Decreased range of motion.     Right lower leg: No edema.     Left lower leg: No edema.     Comments: Pain elicited with range of motion testing  Skin:    General: Skin is warm and dry.  Neurological:     General: No focal deficit present.     Mental Status: He is alert and oriented to person, place, and time.  Psychiatric:        Mood and Affect: Mood normal.        Behavior: Behavior normal.        Thought Content: Thought content normal.        Judgment: Judgment normal.       Assessment & Plan:   Problem List Items Addressed This Visit   None Visit Diagnoses     Acute right-sided low back pain with right-sided sciatica    -  Primary   Relevant Medications   methylPREDNISolone sodium succinate (SOLU-MEDROL) 125 mg/2 mL injection 125 mg (Completed)   ketorolac (TORADOL) injection 60 mg (Completed)   cyclobenzaprine (FLEXERIL) 10 MG tablet      1. Acute right-sided low back pain with right-sided sciatica Patient has dialysis Monday Wednesday Friday.  Already went to dialysis today.  Patient education given on supportive care, red flags given for prompt reevaluation. - methylPREDNISolone sodium succinate (SOLU-MEDROL) 125 mg/2 mL injection 125 mg - ketorolac (TORADOL) injection 60 mg - cyclobenzaprine (FLEXERIL) 10 MG tablet; Take 1 tablet (10 mg total) by mouth 3 (three) times daily as needed for muscle spasms.  Dispense: 30 tablet; Refill: 0   I have reviewed the patient's medical history (PMH, PSH,  Social History, Family History, Medications, and allergies) ,  and have been updated if relevant. I spent 30 minutes reviewing chart and  face to face time with patient.    Return if symptoms worsen or fail to improve.    Loraine Grip Mayers, PA-C

## 2022-01-21 NOTE — Patient Instructions (Signed)
You can use the muscle relaxer every 8 hours as needed, I encourage you to use ice, gentle stretching, and make sure you are getting plenty of rest.  I hope that you feel better soon, please let us know if there is anything else we can do for you.  Jeremy Rad, PA-C Physician Assistant [facility] [email or web address]   Sciatica  Sciatica is pain, numbness, weakness, or tingling along the path of the sciatic nerve. The sciatic nerve starts in the lower back and runs down the back of each leg. The nerve controls the muscles in the lower leg and in the back of the knee. It also provides feeling (sensation) to the back of the thigh, the lower leg, and the sole of the foot. Sciatica is a symptom of another medical condition that pinches or puts pressure on the sciatic nerve. Sciatica most often only affects one side of the body. Sciatica usually goes away on its own or with treatment. In some cases, sciatica may come back (recur). What are the causes? This condition is caused by pressure on the sciatic nerve or pinching of the nerve. This may be the result of: A disk in between the bones of the spine bulging out too far (herniated disk). Age-related changes in the spinal disks. A pain disorder that affects a muscle in the buttock. Extra bone growth near the sciatic nerve. A break (fracture) of the pelvis. Pregnancy. Tumor. This is rare. What increases the risk? The following factors may make you more likely to develop this condition: Playing sports that place pressure or stress on the spine. Having poor strength and flexibility. A history of back injury or surgery. Sitting for long periods of time. Doing activities that involve repetitive bending or lifting. Obesity. What are the signs or symptoms? Symptoms can vary from mild to very severe, and they may include: Any of these problems in the lower back, leg, hip, or buttock: Mild  tingling, numbness, or dull aches. Burning sensations. Sharp pains. Numbness in the back of the calf or the sole of the foot. Leg weakness. Severe back pain that makes movement difficult. Symptoms may get worse when you cough, sneeze, or laugh, or when you sit or stand for long periods of time. How is this diagnosed? This condition may be diagnosed based on: Your symptoms and medical history. A physical exam. Blood tests. Imaging tests, such as: X-rays. MRI. CT scan. How is this treated? In many cases, this condition improves on its own without treatment. However, treatment may include: Reducing or modifying physical activity. Exercising and stretching. Icing and applying heat to the affected area. Medicines that help to: Relieve pain and swelling. Relax your muscles. Injections of medicines that help to relieve pain, irritation, and inflammation around the sciatic nerve (steroids). Surgery. Follow these instructions at home: Medicines Take over-the-counter and prescription medicines only as told by your health care provider. Ask your health care provider if the medicine prescribed to you: Requires you to avoid driving or using heavy machinery. Can cause constipation. You may need to take these actions to prevent or treat constipation: Drink enough fluid to keep your urine pale yellow. Take over-the-counter or prescription medicines. Eat foods that are high in fiber, such as beans, whole grains, and fresh fruits and vegetables. Limit foods that are high in fat and processed sugars, such as fried or sweet foods. Managing pain     If directed, put ice on the affected area. Put ice in a plastic  bag. Place a towel between your skin and the bag. Leave the ice on for 20 minutes, 2-3 times a day. If directed, apply heat to the affected area. Use the heat source that your health care provider recommends, such as a moist heat pack or a heating pad. Place a towel between your skin  and the heat source. Leave the heat on for 20-30 minutes. Remove the heat if your skin turns bright red. This is especially important if you are unable to feel pain, heat, or cold. You may have a greater risk of getting burned. Activity  Return to your normal activities as told by your health care provider. Ask your health care provider what activities are safe for you. Avoid activities that make your symptoms worse. Take brief periods of rest throughout the day. When you rest for longer periods, mix in some mild activity or stretching between periods of rest. This will help to prevent stiffness and pain. Avoid sitting for long periods of time without moving. Get up and move around at least one time each hour. Exercise and stretch regularly, as told by your health care provider. Do not lift anything that is heavier than 10 lb (4.5 kg) while you have symptoms of sciatica. When you do not have symptoms, you should still avoid heavy lifting, especially repetitive heavy lifting. When you lift objects, always use proper lifting technique, which includes: Bending your knees. Keeping the load close to your body. Avoiding twisting. General instructions Maintain a healthy weight. Excess weight puts extra stress on your back. Wear supportive, comfortable shoes. Avoid wearing high heels. Avoid sleeping on a mattress that is too soft or too hard. A mattress that is firm enough to support your back when you sleep may help to reduce your pain. Keep all follow-up visits as told by your health care provider. This is important. Contact a health care provider if: You have pain that: Wakes you up when you are sleeping. Gets worse when you lie down. Is worse than you have experienced in the past. Lasts longer than 4 weeks. You have an unexplained weight loss. Get help right away if: You are not able to control when you urinate or have bowel movements (incontinence). You have: Weakness in your lower back,  pelvis, buttocks, or legs that gets worse. Redness or swelling of your back. A burning sensation when you urinate. Summary Sciatica is pain, numbness, weakness, or tingling along the path of the sciatic nerve. This condition is caused by pressure on the sciatic nerve or pinching of the nerve. Sciatica can cause pain, numbness, or tingling in the lower back, legs, hips, and buttocks. Treatment often includes rest, exercise, medicines, and applying ice or heat. This information is not intended to replace advice given to you by your health care provider. Make sure you discuss any questions you have with your health care provider. Document Revised: 07/27/2018 Document Reviewed: 07/27/2018 Elsevier Patient Education  [place].

## 2022-01-21 NOTE — Telephone Encounter (Signed)
     Chief Complaint: Low back pain, radiates down right leg Symptoms: Above Frequency: 1 month ago after heavy lifting Pertinent Negatives: Patient denies  Disposition: '[]'$ ED /'[]'$ Urgent Care (no appt availability in office) / '[]'$ Appointment(In office/virtual)/ '[]'$  [name]/ '[]'$ Home Care/ '[]'$ Refused Recommended Disposition /'[x]'$ [facility]/ '[]'$  Follow-up with PCP Additional Notes:   Reason for Disposition  [1] SEVERE back pain (e.g., excruciating, unable to do any normal activities) AND [2] not improved 2 hours after pain medicine  Answer Assessment - Initial Assessment Questions 1. ONSET: "When did the pain begin?"      1 month ago 2. LOCATION: "Where does it hurt?" (upper, mid or lower back)     Low back on right 3. SEVERITY: "How bad is the pain?"  (e.g., Scale 1-10; mild, moderate, or severe)   - MILD (1-3): doesn't interfere with normal activities    - MODERATE (4-7): interferes with normal activities or awakens from sleep    - SEVERE (8-10): excruciating pain, unable to do any normal activities      8-10 4. PATTERN: "Is the pain constant?" (e.g., yes, no; constant, intermittent)      Constant 5. RADIATION: "Does the pain shoot into your legs or elsewhere?"     Down leg 6. CAUSE:  "What do you think is causing the back pain?"      Unsure 7. BACK OVERUSE:  "Any recent lifting of heavy objects, strenuous work or exercise?"     Maybe 8. MEDICATIONS: "What have you taken so far for the pain?" (e.g., nothing, acetaminophen, NSAIDS)     OTC 9. NEUROLOGIC SYMPTOMS: "Do you have any weakness, numbness, or problems with bowel/bladder control?"     No 10. OTHER SYMPTOMS: "Do you have any other symptoms?" (e.g., fever, abdominal pain, burning with urination, blood in urine)       No 11. PREGNANCY: "Is there any chance you are pregnant?" (e.g., yes, no; LMP)       N/A  Protocols used: Back Pain-A-AH

## 2022-01-22 NOTE — Progress Notes (Signed)
  Subjective:  Patient ID: Jeremy Honour., male    DOB: 1966/06/10,  MRN: [id number]  Jeremy Honour. presents to clinic today for at risk foot care with h/o NIDDM with ESRD on hemodialysis and painful elongated mycotic toenails 1-5 bilaterally which are tender when wearing enclosed shoe gear. Pain is relieved with periodic professional debridement.  Last A1c was about 10%. Patient did not check blood glucose today. His dialysis days are MWF.  New problem(s): None.   PCP is Gildardo Pounds, NP , and last visit was September 10, 2021.  Allergies  Allergen Reactions   Amlodipine Other (See Comments)    Dizziness    Review of Systems: Negative except as noted in the HPI.  Objective: No changes noted in today's physical examination. Constitutional Patient is a pleasant 56 y.o. African American male morbidly obese in NAD. AAO x 3.  Vascular CFT <3 seconds b/l LE. Palpable DP pulse(s) b/l LE. Palpable PT pulse(s) b/l LE. Pedal hair sparse. No pain with calf compression b/l. Lower extremity skin temperature gradient within normal limits. No edema noted b/l LE. No cyanosis or clubbing noted.  Neurologic Normal speech. Protective sensation intact 5/5 intact bilaterally with 10g monofilament b/l.  Dermatologic Pedal integument with normal turgor, texture and tone BLE. No open wounds b/l LE. No interdigital macerations noted b/l LE. Toenails 1-5 b/l elongated, discolored, dystrophic, thickened, crumbly with subungual debris and tenderness to dorsal palpation. No hyperkeratotic nor porokeratotic lesions present on today's visit.  Orthopedic Muscle strength 5/5 to all lower extremity muscle groups bilaterally. No pain, crepitus or joint limitation noted with ROM bilateral LE. No gross bony deformities bilaterally.      Latest Ref Rng & Units 09/10/2021    3:31 PM 04/03/2021   11:44 AM  Hemoglobin A1C  Hemoglobin-A1c 4.0 - 5.6 % 12.3  12.9    Assessment/Plan: 1. Pain due to onychomycosis of  toenails of both feet   2. Diabetes mellitus due to underlying condition with chronic kidney disease on chronic dialysis, with long-term current use of insulin ([place])     -Patient was evaluated and treated. All patient's and/or POA's questions/concerns answered on today's visit. -No new findings. No new orders. -Stressed the importance of good glycemic control and the detriment of not  controlling glucose levels in relation to the foot. -Patient to continue soft, supportive shoe gear daily. -Toenails 1-5 b/l were debrided in length and girth with sterile nail nippers and dremel without iatrogenic bleeding.  -Patient/POA to call should there be question/concern in the interim.   Return in about 3 months (around 04/17/2022).  Marzetta Board, DPM

## 2022-01-28 ENCOUNTER — Ambulatory Visit: Payer: Self-pay

## 2022-01-28 NOTE — Telephone Encounter (Signed)
Call placed to patient and VM is not set up to leave a VM.  Patient can e scheduled a virtual appointment on any Tuesday with [facility].

## 2022-01-28 NOTE — Telephone Encounter (Signed)
  Chief Complaint: Back pain Symptoms: Back pain 8/10 Frequency: 3-4 weeks - increasing in pain Pertinent Negatives: Patient denies neurological deficit Disposition: '[]'$ ED /'[]'$ Urgent Care (no appt availability in office) / '[]'$ Appointment(In office/virtual)/ '[]'$  [facility]/ '[]'$ Home Care/ '[x]'$ Refused Recommended Disposition /'[]'$ [facility]/ '[]'$  Follow-up with PCP Additional Notes: Pt has had back pain 8/10 for 3-4 weeks. Pt was seen and prescribed medication. Pt states that although they do provide some relief it does not last. Pt would like a referral to be seen by a specialist to better diagnose issue and treat.    Reason for Disposition  [1] SEVERE back pain (e.g., excruciating, unable to do any normal activities) AND [2] not improved 2 hours after pain medicine  Answer Assessment - Initial Assessment Questions 1. ONSET: "When did the pain begin?"      3-4 weeks ago 2. LOCATION: "Where does it hurt?" (upper, mid or lower back)     Back  - shoots down legs 3. SEVERITY: "How bad is the pain?"  (e.g., Scale 1-10; mild, moderate, or severe)   - MILD (1-3): doesn't interfere with normal activities    - MODERATE (4-7): interferes with normal activities or awakens from sleep    - SEVERE (8-10): excruciating pain, unable to do any normal activities      8-9/10 4. PATTERN: "Is the pain constant?" (e.g., yes, no; constant, intermittent)      Yes - mostly 5. RADIATION: "Does the pain shoot into your legs or elsewhere?"     Legs into knee 6. CAUSE:  "What do you think is causing the back pain?"      Moving  - packing 7. BACK OVERUSE:  "Any recent lifting of heavy objects, strenuous work or exercise?"     Moving packing 8. MEDICATIONS: "What have you taken so far for the pain?" (e.g., nothing, acetaminophen, NSAIDS)     Icy hot, meds from mobile clinic. Tylenol 9. NEUROLOGIC SYMPTOMS: "Do you have any weakness, numbness, or problems with bowel/bladder control?"     no 10. OTHER  SYMPTOMS: "Do you have any other symptoms?" (e.g., fever, abdominal pain, burning with urination, blood in urine)       no 11. PREGNANCY: "Is there any chance you are pregnant?" (e.g., yes, no; LMP)       na  Protocols used: Back Pain-A-AH

## 2022-01-29 ENCOUNTER — Encounter: Payer: Self-pay | Admitting: Nurse Practitioner

## 2022-01-29 ENCOUNTER — Telehealth: Payer: Managed Care, Other (non HMO) | Admitting: Nurse Practitioner

## 2022-01-29 DIAGNOSIS — M5441 Lumbago with sciatica, right side: Secondary | ICD-10-CM

## 2022-01-29 DIAGNOSIS — Z794 Long term (current) use of insulin: Secondary | ICD-10-CM

## 2022-01-29 DIAGNOSIS — E1151 Type 2 diabetes mellitus with diabetic peripheral angiopathy without gangrene: Secondary | ICD-10-CM

## 2022-01-29 MED ORDER — TRUE METRIX BLOOD GLUCOSE TEST VI STRP: ORAL_STRIP | 12 refills | Status: DC

## 2022-01-29 NOTE — Progress Notes (Signed)
Virtual Visit via Telephone Note  I discussed the limitations, risks, security and privacy concerns of performing an evaluation and management service by telephone and the availability of in person appointments. I also discussed with the patient that there may be a patient responsible charge related to this service. The patient expressed understanding and agreed to proceed.    I connected with Jeremy [name]. on 01/29/22  at   2:30 PM EDT  EDT by telephone and verified that I am speaking with the correct person using two identifiers.  Location of Patient: Private Residence   Location of Provider: [place] and [place] participating in Telemedicine visit: [name] FNP-BC Jeremy [name].    History of Present Illness: Telemedicine visit for: Acute low back pain   Requesting xray of his lower back.   Endorses acute back pain with onset several weeks ago. Unrelated to any known injury or trauma. 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 He was seen at [facility] clinic on 01-21-2022 for the same symptoms and treated with solumedrol injection and toradol and sent home with flexeril. He does report improvement in his symptoms however requesting xray to make sure "there is nothing else going on". Describes current pain as a little tension in his back. Previously he reported pain in lower back with radiating pain to the right hip and lower leg. Denies any associated GI or GU symptoms..    Past Medical History:  Diagnosis Date   Abscess    AKI (acute kidney injury) ([place]) 08/05/2017   Bell's palsy    right eye abn since dx bell's palsy   CKD (chronic kidney disease)    Dr. McDiarmind    Dyspnea    occasional with exertion   Headache    High cholesterol    Hypertension    Left shoulder pain 10/26/2013   S/p injection on 10/26/13    Renal insufficiency 02/14/2014   Seizures ([place]) 08/04/2017   "related to high blood  sugars" (08/05/2017)   Type II diabetes mellitus ([place])     Past Surgical History:  Procedure Laterality Date   AV FISTULA PLACEMENT Left 11/11/2019   Procedure: LEFT FOREARM RADIOCEPHALIC ARTERIOVENOUS (AV) FISTULA CREATION;  Surgeon: [name], MD;  [facility];  Service: Vascular;  Laterality: Left;   Eyelid surgery Right    I & D EXTREMITY  11/11/2011   Procedure: IRRIGATION AND DEBRIDEMENT EXTREMITY;  Surgeon: Merrie Roof, MD;  Location: [facility];  Service: General;  Laterality: Right;  I&D Right Thigh Abscess   IR FLUORO GUIDE CV LINE RIGHT  08/12/2019   IR US GUIDE VASC ACCESS RIGHT  08/12/2019   REVISON OF ARTERIOVENOUS FISTULA Left 03/23/2020   Procedure: LEFT ARM ARTERIOVENOUS FISTULA REVISON, LIGATION OF SIDE BRANCHES AND ELEVATION;  Surgeon: [name], MD;  [facility];  Service: Vascular;  Laterality: Left;    Family History  Problem Relation Age of Onset   Diabetes Mother    Heart disease Mother 89   Diabetes Sister    Diabetes Brother    Diabetes Brother    Cancer Maternal Uncle        type unknown   Stroke Neg Hx     Social History   Socioeconomic History   Marital status: Divorced    Spouse name: Not on file   Number of children: 3   Years of education: Not on file  Highest education level: Not on file  Occupational History   Occupation: maintenance tech   Occupation: Disablity  Tobacco Use   Smoking status: Never   Smokeless tobacco: Never  Vaping Use   Vaping Use: Never used  Substance and Sexual Activity   Alcohol use: Not Currently   Drug use: No   Sexual activity: Yes  Other Topics Concern   Not on file  Social History Narrative   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  Did not graduate high school.  Has 3 adult children in [place].  Divorced.  Lives with girlfriend [name].                   Social Determinants of Health   Financial Resource Strain: Not on file  Food  Insecurity: Not on file  Transportation Needs: Not on file  Physical Activity: Unknown (08/31/2019)   Exercise Vital Sign    Days of Exercise per Week: 2 days    Minutes of Exercise per Session: Not on file  Stress: Not on file  Social Connections: Not on file     Observations/Objective: Awake, alert and oriented x 3   Review of Systems  Constitutional:  Negative for fever, malaise/fatigue and weight loss.  HENT: Negative.  Negative for nosebleeds.   Eyes: Negative.  Negative for blurred vision, double vision and photophobia.  Respiratory: Negative.  Negative for cough and shortness of breath.   Cardiovascular: Negative.  Negative for chest pain, palpitations and leg swelling.  Gastrointestinal: Negative.  Negative for heartburn, nausea and vomiting.  Musculoskeletal:  Positive for back pain. Negative for myalgias.  Neurological: Negative.  Negative for dizziness, focal weakness, seizures and headaches.  Psychiatric/Behavioral: Negative.  Negative for suicidal ideas.     Assessment and Plan: Diagnoses and all orders for this visit:  Acute bilateral low back pain with right-sided sciatica -     DG Lumbar Spine Complete; Future Work on losing weight to help reduce back pain. May alternate with heat and ice application for pain relief. May also alternate with acetaminophen and Ibuprofen as prescribed for back pain. Other alternatives include massage, acupuncture and water aerobics.    Type 2 diabetes mellitus with diabetic peripheral angiopathy without gangrene, with long-term current use of insulin ([facility]) -     glucose blood (TRUE METRIX BLOOD GLUCOSE TEST) test strip; Use as instructed. Check blood glucose level by fingerstick 3 times per day.     Follow Up Instructions Return if symptoms worsen or fail to improve.     I discussed the assessment and treatment plan with the patient. The patient was provided an opportunity to ask questions and all were answered. The patient agreed  with the plan and demonstrated an understanding of the instructions.   The patient was advised to call back or seek an in-person evaluation if the symptoms worsen or if the condition fails to improve as anticipated.  I provided 11 minutes of non-face-to-face time during this encounter including median intraservice time, reviewing previous notes, labs, imaging, medications and explaining diagnosis and management.  Gildardo Pounds, FNP-BC

## 2022-01-30 ENCOUNTER — Ambulatory Visit
Admission: RE | Admit: 2022-01-30 | Discharge: 2022-01-30 | Disposition: A | Discharge status: Home / Self Care | Payer: Medicare Other | Source: Ambulatory Visit | Attending: Nurse Practitioner | Admitting: Nurse Practitioner

## 2022-01-30 DIAGNOSIS — M5441 Lumbago with sciatica, right side: Secondary | ICD-10-CM

## 2022-01-31 ENCOUNTER — Other Ambulatory Visit: Payer: Self-pay | Admitting: Nurse Practitioner

## 2022-01-31 DIAGNOSIS — M47816 Spondylosis without myelopathy or radiculopathy, lumbar region: Secondary | ICD-10-CM

## 2022-01-31 DIAGNOSIS — E785 Hyperlipidemia, unspecified: Secondary | ICD-10-CM

## 2022-01-31 DIAGNOSIS — E1151 Type 2 diabetes mellitus with diabetic peripheral angiopathy without gangrene: Secondary | ICD-10-CM

## 2022-01-31 DIAGNOSIS — N184 Chronic kidney disease, stage 4 (severe): Secondary | ICD-10-CM

## 2022-01-31 DIAGNOSIS — R972 Elevated prostate specific antigen [PSA]: Secondary | ICD-10-CM

## 2022-02-01 ENCOUNTER — Telehealth: Payer: Self-pay | Admitting: *Deleted

## 2022-02-01 NOTE — Telephone Encounter (Signed)
Patient called for xray results and informed per notes: Xray shows some of the spaces in the lower spine have narrowed a little. We normally treat this with anti inflammatories.Unfortunately because of your kidney disease those types of medications are not recommended. I will refer you to ortho for further recommendations.    He also needs to stop by the lab so we can check his potassium levels and A1c as soon as possible. Thanks. He can follow up with Dr Joya Gaskins in a few weeks for his BP  Patient informed of appointment time/date and advised to get labs before if possible.

## 2022-02-01 NOTE — Telephone Encounter (Signed)
Pt given XR results per notes of Zelda, NP on 02/01/22. Pt verbalized understanding and scheduled lab appt for 02/04/22 at 1030 after dialysis appt.

## 2022-02-01 NOTE — Telephone Encounter (Signed)
Noted  

## 2022-02-04 ENCOUNTER — Ambulatory Visit (INDEPENDENT_AMBULATORY_CARE_PROVIDER_SITE_OTHER): Payer: Managed Care, Other (non HMO) | Admitting: Physical Medicine and Rehabilitation

## 2022-02-04 ENCOUNTER — Encounter: Payer: Self-pay | Admitting: Physical Medicine and Rehabilitation

## 2022-02-04 ENCOUNTER — Ambulatory Visit: Payer: Managed Care, Other (non HMO) | Attending: Nurse Practitioner

## 2022-02-04 DIAGNOSIS — M47816 Spondylosis without myelopathy or radiculopathy, lumbar region: Secondary | ICD-10-CM

## 2022-02-04 DIAGNOSIS — M4726 Other spondylosis with radiculopathy, lumbar region: Secondary | ICD-10-CM

## 2022-02-04 DIAGNOSIS — E1151 Type 2 diabetes mellitus with diabetic peripheral angiopathy without gangrene: Secondary | ICD-10-CM

## 2022-02-04 DIAGNOSIS — E785 Hyperlipidemia, unspecified: Secondary | ICD-10-CM

## 2022-02-04 DIAGNOSIS — M5416 Radiculopathy, lumbar region: Secondary | ICD-10-CM

## 2022-02-04 DIAGNOSIS — Z992 Dependence on renal dialysis: Secondary | ICD-10-CM

## 2022-02-04 DIAGNOSIS — N186 End stage renal disease: Secondary | ICD-10-CM

## 2022-02-04 DIAGNOSIS — R972 Elevated prostate specific antigen [PSA]: Secondary | ICD-10-CM

## 2022-02-04 DIAGNOSIS — N184 Chronic kidney disease, stage 4 (severe): Secondary | ICD-10-CM

## 2022-02-04 NOTE — Progress Notes (Unsigned)
Jeremy [name]. - 56 y.o. male MRN [id number]  Date of birth: 01/12/1966  Office Visit Note: Visit Date: 02/04/2022 PCP: Gildardo Pounds, NP Referred by: Gildardo Pounds, NP  Subjective: Chief Complaint  Patient presents with   Lower Back - Pain   Right Hip - Pain   Right Knee - Pain   Right Leg - Pain   HPI: Jeremy [name]. is a 56 y.o. male who comes in today per the request of [name], NP for evaluation of chronic, worsening and severe right sided lower back pain radiating to right lateral thigh and medial aspect of right knee. Patient reports pain has been ongoing for over 6 weeks. States his pain comes on intermittently, no exacerbating factors. He describes his pain as a sore, aching and burning sensation, currently rates as 6 out of 10. He reports some relief of pain with home exercise regimen, rest and use of medications. Patient states exercising is particularly difficult due to decreased energy levels from hemodialysis. Patient states he does try to get out and walk multiple times a week. Patient was recently given IM injections of both Solu-Medrol and Toradol by his primary care provider, was also prescribed Flexeril. He reports some relief of pain with these medications. Patients recent lumbar x-ray images exhibits multilevel facet arthropathy and grade 1 anterolisthesis of L3 on L4 and L4 on L5. Patient has no history of lumber injections/lumbar surgery. Patient states his pain is making it very difficult to sleep and complete daily tasks. Patient denies focal weakness, numbness and tingling. Patient denies recent trauma or falls.   Patients course is complicated by diabetes mellitus, chronic kidney disease, OSA, and morbid obesity.  He attends hemodialysis Monday, Wednesday and Friday.    Oswestry Disability Index Score 8%  0 to 10 (20%)  minimal disability: The patient can cope with most living activities. Usually no treatment is indicated apart from advice on lifting  sitting and exercise.  Review of Systems  Musculoskeletal:  Positive for back pain.  Neurological:  Negative for tingling, sensory change, focal weakness and weakness.  All other systems reviewed and are negative.  Otherwise per HPI.  Assessment & Plan: Visit Diagnoses:    ICD-10-CM   1. Lumbar radiculopathy  M54.16 MR LUMBAR SPINE WO CONTRAST    2. Other spondylosis with radiculopathy, lumbar region  M47.26 MR LUMBAR SPINE WO CONTRAST    3. Facet arthropathy, lumbar  M47.816 MR LUMBAR SPINE WO CONTRAST    4. ESRD on dialysis ([place])  N18.6 MR LUMBAR SPINE WO CONTRAST   Z99.2        Plan: Findings:  Chronic, worsening and severe right sided lower back pain radiating to right lateral thigh and medial aspect of right knee. Patient continues to have severe pain despite good conservative therapies such as home exercise regimen, rest and use of medications. Patients clinical presentation and exam are consistent with lumbar radiculopathy, specifically L4 nerve pattern. We believe the next step is to obtain lumbar MRI imaging, which I did place order for today. I would like to follow up with patient after lumbar MRI imaging is obtained to discuss possible lumbar epidural steroid injection. I did discuss lumbar injection procedure with patient today and he has no questions at this time. Patient encouraged to remain active as tolerated. No red flag symptoms noted upon exam today.     Meds & Orders: No orders of the defined types were placed in this encounter.   Orders Placed  This Encounter  Procedures   MR LUMBAR SPINE WO CONTRAST    Follow-up: Return for follow up after lumbar MRI is obtained.   Procedures: No procedures performed      Clinical History: EXAM: LUMBAR SPINE - COMPLETE 4+ VIEW   COMPARISON:  None Available.   FINDINGS: Lumbar vertebral body height are preserved without evidence of fracture. Minimal grade 1 anterolisthesis of L3 on L4 and L4 on L5. Mild intervertebral  disc space narrowing from L3-S1. Facet arthropathy. No spondylolysis identified.   IMPRESSION: Degenerative changes of the lumbar spine as described.     Electronically Signed   By: Ofilia Neas M.D.   On: 01/31/2022 14:31   He reports that he has never smoked. He has never used smokeless tobacco.  Recent Labs    04/03/21 1144 09/10/21 1531  HGBA1C 12.9* 12.3*    Objective:  VS:  HT:    WT:   BMI:     BP:   HR: bpm  TEMP: ( )  RESP:  Physical Exam Vitals and nursing note reviewed.  HENT:     Head: Normocephalic and atraumatic.     Right Ear: External ear normal.     Left Ear: External ear normal.     Nose: Nose normal.     Mouth/Throat:     Mouth: Mucous membranes are moist.  Eyes:     Extraocular Movements: Extraocular movements intact.  Cardiovascular:     Rate and Rhythm: Normal rate.     Pulses: Normal pulses.  Pulmonary:     Effort: Pulmonary effort is normal.  Abdominal:     General: Abdomen is flat. There is no distension.  Musculoskeletal:        General: Tenderness present.     Cervical back: Normal range of motion.     Comments: Pt is slow to rise from seated position to standing. Good lumbar range of motion. Strong distal strength without clonus, no pain upon palpation of greater trochanters. Dysesthesias noted to right L4 dermatome. Sensation intact bilaterally. Walks independently, gait steady.   Skin:    General: Skin is warm and dry.     Capillary Refill: Capillary refill takes less than 2 seconds.  Neurological:     General: No focal deficit present.     Mental Status: He is alert and oriented to person, place, and time.  Psychiatric:        Mood and Affect: Mood normal.        Behavior: Behavior normal.     Ortho Exam  Imaging: No results found.  Past Medical/Family/Surgical/Social History: Medications & Allergies reviewed per EMR, new medications updated. Patient Active Problem List   Diagnosis Date Noted   Pain, unspecified  12/31/2021   Encounter for removal of sutures 10/05/2021   ESRD on dialysis ([place]) 11/09/2019   Allergy, unspecified, initial encounter 08/16/2019   Anaphylactic shock, unspecified, initial encounter 08/16/2019   Anemia in chronic kidney disease 82/95/6213   Complication of vascular dialysis catheter 08/16/2019   Fever, unspecified 08/16/2019   Iron deficiency anemia, unspecified 08/16/2019   Other specified coagulation defects ([place]) 08/16/2019   Pruritus, unspecified 08/16/2019   Shortness of breath 08/16/2019   Type 2 diabetes mellitus with diabetic peripheral angiopathy without gangrene ([place]) 08/16/2019   COVID-19 virus infection 08/08/2019   Acute right ankle pain 07/27/2019   Tinnitus 05/26/2019   OSA (obstructive sleep apnea) 10/07/2018   Resistant hypertension 09/23/2018   Secondary hyperparathyroidism ([place]) 04/06/2018   Pituitary incidentaloma 08/10/2017  Seizure ([place])    Bilateral lower extremity edema 04/08/2017   Uncontrolled type 2 diabetes mellitus with diabetic nephropathy, with long-term current use of insulin 03/03/2017   Chronic kidney disease (CKD), stage IV (severe) ([place]) 02/14/2014   Morbid obesity ([place]) 05/20/2013   Erectile dysfunction associated with type 2 diabetes mellitus ([place]) 06/02/2012   Macular edema 12/12/2011   Mixed hyperlipidemia due to type 2 diabetes mellitus ([place]) 11/11/2011   Past Medical History:  Diagnosis Date   Abscess    AKI (acute kidney injury) ([place]) 08/05/2017   Bell's palsy    right eye abn since dx bell's palsy   CKD (chronic kidney disease)    Dr. McDiarmind    Dyspnea    occasional with exertion   Headache    High cholesterol    Hypertension    Left shoulder pain 10/26/2013   S/p injection on 10/26/13    Renal insufficiency 02/14/2014   Seizures ([place]) 08/04/2017   "related to high blood sugars" (08/05/2017)   Type II diabetes mellitus ([place])    Family History  Problem Relation Age of Onset   Diabetes Mother    Heart  disease Mother 26   Diabetes Sister    Diabetes Brother    Diabetes Brother    Cancer Maternal Uncle        type unknown   Stroke Neg Hx    Past Surgical History:  Procedure Laterality Date   AV FISTULA PLACEMENT Left 11/11/2019   Procedure: LEFT FOREARM RADIOCEPHALIC ARTERIOVENOUS (AV) FISTULA CREATION;  Surgeon: [name], MD;  [facility];  Service: Vascular;  Laterality: Left;   Eyelid surgery Right    I & D EXTREMITY  11/11/2011   Procedure: IRRIGATION AND DEBRIDEMENT EXTREMITY;  Surgeon: Merrie Roof, MD;  [facility];  Service: General;  Laterality: Right;  I&D Right Thigh Abscess   IR FLUORO GUIDE CV LINE RIGHT  08/12/2019   IR US GUIDE VASC ACCESS RIGHT  08/12/2019   REVISON OF ARTERIOVENOUS FISTULA Left 03/23/2020   Procedure: LEFT ARM ARTERIOVENOUS FISTULA REVISON, LIGATION OF SIDE BRANCHES AND ELEVATION;  Surgeon: [name], MD;  [facility];  Service: Vascular;  Laterality: Left;   Social History   Occupational History   Occupation: maintenance tech   Occupation: Disablity  Tobacco Use   Smoking status: Never   Smokeless tobacco: Never  Vaping Use   Vaping Use: Never used  Substance and Sexual Activity   Alcohol use: Not Currently   Drug use: No   Sexual activity: Yes

## 2022-02-04 NOTE — Progress Notes (Unsigned)
Pt state lower back pain that travels down right hip, knee and leg. Pt state laying down makes the pain worse. Pt state he take [place] meds to help ease his pain.  Numeric Pain Rating Scale and Functional Assessment Average Pain 10 Pain Right Now 6 My pain is intermittent, sharp, burning, stabbing, and aching Pain is worse with: some activites and laying down Pain improves with: rest and medication   In the last MONTH (on 0-10 scale) has pain interfered with the following?  1. General activity like being  able to carry out your everyday physical activities such as walking, climbing stairs, carrying groceries, or moving a chair?  Rating(5)  2. Relation with others like being able to carry out your usual social activities and roles such as  activities at home, at work and in your community. Rating(6)  3. Enjoyment of life such that you have  been bothered by emotional problems such as feeling anxious, depressed or irritable?  Rating(7)

## 2022-02-05 LAB — CBC WITH DIFFERENTIAL/PLATELET
Basophils Absolute: 0.1 10*3/uL (ref 0.0–0.2)
Basos: 1 %
EOS (ABSOLUTE): 0.3 10*3/uL (ref 0.0–0.4)
Eos: 4 %
Hematocrit: 43.7 % (ref 37.5–51.0)
Hemoglobin: 14.3 g/dL (ref 13.0–17.7)
Immature Grans (Abs): 0.1 10*3/uL (ref 0.0–0.1)
Immature Granulocytes: 1 %
Lymphocytes Absolute: 1.3 10*3/uL (ref 0.7–3.1)
Lymphs: 18 %
MCH: 25.9 pg — ABNORMAL LOW (ref 26.6–33.0)
MCHC: 32.7 g/dL (ref 31.5–35.7)
MCV: 79 fL (ref 79–97)
Monocytes Absolute: 0.9 10*3/uL (ref 0.1–0.9)
Monocytes: 12 %
Neutrophils Absolute: 4.4 10*3/uL (ref 1.4–7.0)
Neutrophils: 64 %
Platelets: 227 10*3/uL (ref 150–450)
RBC: 5.52 x10E6/uL (ref 4.14–5.80)
RDW: 14.4 % (ref 11.6–15.4)
WBC: 6.9 10*3/uL (ref 3.4–10.8)

## 2022-02-05 LAB — HEMOGLOBIN A1C
Est. average glucose Bld gHb Est-mCnc: 283 mg/dL
Hgb A1c MFr Bld: 11.5 % — ABNORMAL HIGH (ref 4.8–5.6)

## 2022-02-05 LAB — LIPID PANEL
Chol/HDL Ratio: 4.8 ratio (ref 0.0–5.0)
Cholesterol, Total: 261 mg/dL — ABNORMAL HIGH (ref 100–199)
HDL: 54 mg/dL (ref >39)
LDL Chol Calc (NIH): 166 mg/dL — ABNORMAL HIGH (ref 0–99)
Triglycerides: 222 mg/dL — ABNORMAL HIGH (ref 0–149)
VLDL Cholesterol Cal: 41 mg/dL — ABNORMAL HIGH (ref 5–40)

## 2022-02-05 LAB — CMP14+EGFR
ALT: 15 IU/L (ref 0–44)
AST: 15 IU/L (ref 0–40)
Albumin/Globulin Ratio: 1.4 (ref 1.2–2.2)
Albumin: 4.4 g/dL (ref 3.8–4.9)
Alkaline Phosphatase: 81 IU/L (ref 44–121)
BUN/Creatinine Ratio: 5 — ABNORMAL LOW (ref 9–20)
BUN: 24 mg/dL (ref 6–24)
Bilirubin Total: 0.3 mg/dL (ref 0.0–1.2)
CO2: 28 mmol/L (ref 20–29)
Calcium: 10.1 mg/dL (ref 8.7–10.2)
Chloride: 92 mmol/L — ABNORMAL LOW (ref 96–106)
Creatinine, Ser: 4.44 mg/dL — ABNORMAL HIGH (ref 0.76–1.27)
Globulin, Total: 3.1 g/dL (ref 1.5–4.5)
Glucose: 340 mg/dL — ABNORMAL HIGH (ref 70–99)
Potassium: 4.1 mmol/L (ref 3.5–5.2)
Sodium: 137 mmol/L (ref 134–144)
Total Protein: 7.5 g/dL (ref 6.0–8.5)
eGFR: 15 mL/min/{1.73_m2} — ABNORMAL LOW (ref >59)

## 2022-02-05 LAB — PSA: Prostate Specific Ag, Serum: 31.3 ng/mL — ABNORMAL HIGH (ref 0.0–4.0)

## 2022-02-06 ENCOUNTER — Other Ambulatory Visit: Payer: Self-pay | Admitting: Nurse Practitioner

## 2022-02-06 DIAGNOSIS — E1151 Type 2 diabetes mellitus with diabetic peripheral angiopathy without gangrene: Secondary | ICD-10-CM

## 2022-02-06 MED ORDER — LANTUS SOLOSTAR 100 UNIT/ML ~~LOC~~ SOPN: 50.0000 [IU] | PEN_INJECTOR | Freq: Every day | SUBCUTANEOUS | 11 refills | Status: DC

## 2022-02-06 MED ORDER — INSULIN LISPRO (1 UNIT DIAL) 100 UNIT/ML (KWIKPEN): PEN_INJECTOR | SUBCUTANEOUS | 11 refills | Status: DC

## 2022-02-08 ENCOUNTER — Telehealth: Payer: Self-pay | Admitting: Nurse Practitioner

## 2022-02-08 NOTE — Telephone Encounter (Signed)
Dorena Bodo with [facility] has called to speak with a member of clinical staff when possible        Dorena Bodo has additional information / concerns related to patient's glucose readings were submitted via fax earlier in the week         Brieanna would like to speak with Alycia when possible    Per CRM

## 2022-02-11 ENCOUNTER — Telehealth: Payer: Self-pay | Admitting: Emergency Medicine

## 2022-02-11 NOTE — Telephone Encounter (Signed)
Copied from [place] [phone]. Topic: General - Other >> Feb 11, 2022  3:21 PM Ja-Kwan M wrote: Reason for CRM: Pt requests handicapped placard due to being short winded and problems with his side and leg. Cb# [phone]

## 2022-02-12 ENCOUNTER — Ambulatory Visit: Payer: Medicare Other | Admitting: Adult Health

## 2022-02-12 NOTE — Telephone Encounter (Signed)
I will decide on handicap placard form after MRI has been resulted.

## 2022-02-12 NOTE — Telephone Encounter (Signed)
Called patient about the handicap placard  unable to contact or leave vm

## 2022-02-12 NOTE — Telephone Encounter (Signed)
Will place paperwork in mailbox

## 2022-02-14 ENCOUNTER — Ambulatory Visit: Payer: Managed Care, Other (non HMO) | Admitting: Critical Care Medicine

## 2022-02-14 NOTE — Telephone Encounter (Signed)
Call placed to [facility] and she is currently on lunch. Call will be returned.

## 2022-02-16 ENCOUNTER — Ambulatory Visit
Admission: RE | Admit: 2022-02-16 | Discharge: 2022-02-16 | Disposition: A | Discharge status: Home / Self Care | Payer: Managed Care, Other (non HMO) | Source: Ambulatory Visit | Attending: Physical Medicine and Rehabilitation | Admitting: Physical Medicine and Rehabilitation

## 2022-02-16 DIAGNOSIS — M4726 Other spondylosis with radiculopathy, lumbar region: Secondary | ICD-10-CM

## 2022-02-16 DIAGNOSIS — N186 End stage renal disease: Secondary | ICD-10-CM

## 2022-02-16 DIAGNOSIS — M5416 Radiculopathy, lumbar region: Secondary | ICD-10-CM

## 2022-02-16 DIAGNOSIS — M47816 Spondylosis without myelopathy or radiculopathy, lumbar region: Secondary | ICD-10-CM

## 2022-02-19 ENCOUNTER — Ambulatory Visit: Payer: Managed Care, Other (non HMO) | Admitting: Adult Health

## 2022-02-19 ENCOUNTER — Encounter: Payer: Self-pay | Admitting: Adult Health

## 2022-02-19 NOTE — Progress Notes (Deleted)
PATIENT: Jeremy [name]. DOB: [birth year]  REASON FOR VISIT: follow up HISTORY FROM: patient Primary neurologist: Dr. Felecia Shelling  HISTORY OF PRESENT ILLNESS: Today 02/19/22:  Jeremy [name] is a 56 year old male with a history of seizures.  He returns today for follow-up.  He is on Keppra extended release 1000 mg daily.  He denies any seizure events.  He continues to operate a motor vehicle without difficulty.  Able to complete all ADLs independently.  Patient reports that he is on disability due to dialysis.  So far he has not had an issue with seizures after dialysis.  He returns today for an evaluation.    02/07/20: Jeremy [name] is a 56 year old male with a history of seizures.  He returns today for follow-up.  He is on Keppra extended release.  His prescription was written for 750 mg 2 tablets daily.  However he states he has been breaking the tablets in half and taking a half a tablet in the morning and a half a tablet in the evening.  He states that the full dose makes him dizzy.  He denies any seizure events.  He operates a Teacher, music without difficulty.  He returns today for an evaluation.  HISTORY 02/04/19:   Jeremy [name] is a 56 year old male with a history of seizures.  He returns today for follow-up.  He is currently on Keppra however he is unsure if he is on the extended release or the immediate release.  We will call his pharmacy to verify.  He reports he has not had any seizures.  He operates a Teacher, music without difficulty.  Denies any changes in his gait or balance.  No change in his mood or behavior.  He returns today for follow-up.  REVIEW OF SYSTEMS: Out of a complete 14 system review of symptoms, the patient complains only of the following symptoms, and all other reviewed systems are negative.  See HPI  ALLERGIES: Allergies  Allergen Reactions   Amlodipine Other (See Comments)    Dizziness    HOME MEDICATIONS: Outpatient Medications Prior to Visit  Medication Sig  Dispense Refill   amLODipine (NORVASC) 10 MG tablet TAKE 1 TABLET BY MOUTH EVERY DAY 90 tablet 0   aspirin EC ([facility] ASPIRIN LOW DOSE) 81 MG tablet TAKE 1 TABLET BY MOUTH EVERY DAY 90 tablet 0   atorvastatin (LIPITOR) 40 MG tablet Take 1 tablet (40 mg total) by mouth daily. 90 tablet 3   Blood Glucose Monitoring Suppl (CONTOUR NEXT MONITOR) w/Device KIT 1 Device by Does not apply route 4 (four) times daily - after meals and at bedtime. 1 kit 0   carvedilol (COREG) 3.125 MG tablet TAKE 1 TABLET (3.125 MG TOTAL) BY MOUTH 2 (TWO) TIMES DAILY WITH A MEAL. 180 tablet 0   carvedilol (COREG) 6.25 MG tablet Take 1 tablet (6.25 mg total) by mouth 2 (two) times daily with a meal. 180 tablet 0   cinacalcet (SENSIPAR) 30 MG tablet Take by mouth.     cyclobenzaprine (FLEXERIL) 10 MG tablet Take 1 tablet (10 mg total) by mouth 3 (three) times daily as needed for muscle spasms. 30 tablet 0   enalapril (VASOTEC) 5 MG tablet Take 1 tablet (5 mg total) by mouth daily. 90 tablet 1   famotidine (PEPCID) 10 MG tablet Take 1 tablet (10 mg total) by mouth 2 (two) times daily. 60 tablet 1   fluticasone (FLONASE) 50 MCG/ACT nasal spray Place 1 spray into both nostrils daily. 16 g 0  FOSRENOL 1000 MG PACK Take 1 packet by mouth 3 (three) times daily.     glucose blood (TRUE METRIX BLOOD GLUCOSE TEST) test strip Use as instructed. Check blood glucose level by fingerstick 3 times per day. 200 each 12   hydrALAZINE (APRESOLINE) 25 MG tablet TAKE 1 TABLET BY MOUTH THREE TIMES A DAY 270 tablet 0   HYDROcodone-acetaminophen (NORCO/VICODIN) 5-325 MG tablet Take 1 tablet by mouth every 6 (six) hours as needed for moderate pain. 12 tablet 0   insulin glargine-yfgn (SEMGLEE) 100 UNIT/ML Pen Inject 50 Units into the skin daily. 15 mL 3   insulin lispro (HUMALOG KWIKPEN) 100 UNIT/ML KwikPen Inject 6 units prior to all meals. 1 hr after meals inject according to scale. For blood sugars 0-150 give 0 units of insulin, 151-200 give 2 units  of insulin, 201-250 give 4 units, 251-300 give 6 units, 301-350 give 8 units, 351-400 give 10 units,> 400 give 12 units 15 mL 11   insulin lispro protamine-lispro (HUMALOG MIX 75/25) (75-25) 100 UNIT/ML SUSP injection Inject 30 Units into the skin daily with breakfast AND 30 Units daily with supper. 18 mL 2   Insulin Pen Needle (B-D ULTRAFINE III SHORT PEN) 31G X 8 MM MISC 1 application. by Does not apply route 3 (three) times daily. E11.65 200 each 11   Lancet Devices (MICROLET NEXT LANCING DEVICE) MISC 1 Device by Does not apply route 3 (three) times daily. 1 each 11   Lanthanum Carbonate (FOSRENOL) 1000 MG PACK Take by mouth.     levETIRAcetam (KEPPRA XR) 500 MG 24 hr tablet TAKE 2 TABLETS BY MOUTH EVERY DAY 180 tablet 0   Microlet Lancets MISC 1 Device by Does not apply route 3 (three) times daily. E11.65 200 each 11   omeprazole (PRILOSEC) 20 MG capsule Take 1 capsule (20 mg total) by mouth daily. 30 capsule 3   topiramate (TOPAMAX) 50 MG tablet Take by mouth.     traZODone (DESYREL) 150 MG tablet Take 1 tablet (150 mg total) by mouth at bedtime. 90 tablet 2   VELPHORO 500 MG chewable tablet Chew 500 mg by mouth 3 (three) times daily.     No facility-administered medications prior to visit.    PAST MEDICAL HISTORY: Past Medical History:  Diagnosis Date   Abscess    AKI (acute kidney injury) ([place]) 08/05/2017   Bell's palsy    right eye abn since dx bell's palsy   CKD (chronic kidney disease)    Dr. McDiarmind    Dyspnea    occasional with exertion   Headache    High cholesterol    Hypertension    Left shoulder pain 10/26/2013   S/p injection on 10/26/13    Renal insufficiency 02/14/2014   Seizures ([place]) 08/04/2017   "related to high blood sugars" (08/05/2017)   Type II diabetes mellitus ([place])     PAST SURGICAL HISTORY: Past Surgical History:  Procedure Laterality Date   AV FISTULA PLACEMENT Left 11/11/2019   Procedure: LEFT FOREARM RADIOCEPHALIC ARTERIOVENOUS (AV) FISTULA  CREATION;  Surgeon: [name], MD;  Location: [facility];  Service: Vascular;  Laterality: Left;   Eyelid surgery Right    I & D EXTREMITY  11/11/2011   Procedure: IRRIGATION AND DEBRIDEMENT EXTREMITY;  Surgeon: Merrie Roof, MD;  Location: [facility];  Service: General;  Laterality: Right;  I&D Right Thigh Abscess   IR FLUORO GUIDE CV LINE RIGHT  08/12/2019   IR US GUIDE VASC ACCESS RIGHT  08/12/2019   REVISON OF ARTERIOVENOUS FISTULA Left 03/23/2020   Procedure: LEFT ARM ARTERIOVENOUS FISTULA REVISON, LIGATION OF SIDE BRANCHES AND ELEVATION;  Surgeon: [name], MD;  [facility];  Service: Vascular;  Laterality: Left;    FAMILY HISTORY: Family History  Problem Relation Age of Onset   Diabetes Mother    Heart disease Mother 52   Diabetes Sister    Diabetes Brother    Diabetes Brother    Cancer Maternal Uncle        type unknown   Stroke Neg Hx     SOCIAL HISTORY: Social History   Socioeconomic History   Marital status: Divorced    Spouse name: Not on file   Number of children: 3   Years of education: Not on file   Highest education level: Not on file  Occupational History   Occupation: maintenance tech   Occupation: Disablity  Tobacco Use   Smoking status: Never   Smokeless tobacco: Never  Vaping Use   Vaping Use: Never used  Substance and Sexual Activity   Alcohol use: Not Currently   Drug use: No   Sexual activity: Yes  Other Topics Concern   Not on file  Social History Narrative   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  Did not graduate high school.  Has 3 adult children in [place].  Divorced.  Lives with girlfriend [name].                   Social Determinants of Health   Financial Resource Strain: Not on file  Food Insecurity: Not on file  Transportation Needs: Not on file  Physical Activity: Unknown (08/31/2019)   Exercise Vital Sign    Days of Exercise per Week: 2 days    Minutes of Exercise per  Session: Not on file  Stress: Not on file  Social Connections: Not on file  Intimate Partner Violence: Not on file      PHYSICAL EXAM  There were no vitals filed for this visit.  There is no height or weight on file to calculate BMI.  Generalized: Well developed, in no acute distress   Neurological examination  Mentation: Alert oriented to time, place, history taking. Follows all commands speech and language fluent Cranial nerve II-XII: Pupils were equal round reactive to light. Extraocular movements were full, visual field were full on confrontational test.Head turning and shoulder shrug  were normal and symmetric. Motor: The motor testing reveals 5 over 5 strength of all 4 extremities. Good symmetric motor tone is noted throughout.  Sensory: Sensory testing is intact to soft touch on all 4 extremities. No evidence of extinction is noted.  Coordination: Cerebellar testing reveals good finger-nose-finger and heel-to-shin bilaterally.  Gait and station: Gait is normal.  Reflexes: Deep tendon reflexes are symmetric and normal bilaterally.   DIAGNOSTIC DATA (LABS, IMAGING, TESTING) - I reviewed patient records, labs, notes, testing and imaging myself where available.  Lab Results  Component Value Date   WBC 6.9 02/04/2022   HGB 14.3 02/04/2022   HCT 43.7 02/04/2022   MCV 79 02/04/2022   PLT 227 02/04/2022      Component Value Date/Time   NA 137 02/04/2022 1129   K 4.1 02/04/2022 1129   CL 92 (L) 02/04/2022 1129   CO2 28 02/04/2022 1129   GLUCOSE 340 (H) 02/04/2022 1129   GLUCOSE 133 (H) 09/07/2021 1016   BUN 24 02/04/2022 1129   CREATININE 4.44 (H) 02/04/2022 1129  CREATININE 2.77 (H) 05/31/2016 0844   CALCIUM 10.1 02/04/2022 1129   PROT 7.5 02/04/2022 1129   ALBUMIN 4.4 02/04/2022 1129   AST 15 02/04/2022 1129   ALT 15 02/04/2022 1129   ALKPHOS 81 02/04/2022 1129   BILITOT 0.3 02/04/2022 1129   GFRNONAA 13 (L) 09/07/2021 1016   GFRNONAA 26 (L) 05/31/2016 0844    GFRAA 15 (L) 08/18/2020 0925   GFRAA 30 (L) 05/31/2016 0844   Lab Results  Component Value Date   CHOL 261 (H) 02/04/2022   HDL 54 02/04/2022   LDLCALC 166 (H) 02/04/2022   TRIG 222 (H) 02/04/2022   CHOLHDL 4.8 02/04/2022   Lab Results  Component Value Date   HGBA1C 11.5 (H) 02/04/2022   Lab Results  Component Value Date   VITAMINB12 412 11/26/2013   Lab Results  Component Value Date   TSH 1.740 09/23/2018      ASSESSMENT AND PLAN 56 y.o. year old male  has a past medical history of Abscess, AKI (acute kidney injury) ([place]) (08/05/2017), Bell's palsy, CKD (chronic kidney disease), Dyspnea, Headache, High cholesterol, Hypertension, Left shoulder pain (10/26/2013), Renal insufficiency (02/14/2014), Seizures ([place]) (08/04/2017), and Type II diabetes mellitus ([place]). here with :  Seizures   Continue Keppra 500 mg extended release-2 tablets daily for total of 1000 mg daily Advised patient if he has any seizure events he should let us know Follow-up in 1 year or sooner if needed     Ward Givens, MSN, NP-C 02/19/2022, 7:51 AM [facility] [address], [place], [place] [address] [phone]    I have read the note, and I agree with the clinical assessment and plan.  Richard A. Felecia Shelling, MD, PhD, [facility] Certified in Neurology, Clinical Neurophysiology, Sleep Medicine, Pain Medicine and Neuroimaging  [facility] [address], [place], [place] [address] [phone]

## 2022-02-20 ENCOUNTER — Ambulatory Visit (INDEPENDENT_AMBULATORY_CARE_PROVIDER_SITE_OTHER): Payer: Medicare Other | Admitting: Physical Medicine and Rehabilitation

## 2022-02-20 ENCOUNTER — Encounter: Payer: Self-pay | Admitting: Physical Medicine and Rehabilitation

## 2022-02-20 VITALS — BP 171/107 | HR 97 | Ht 68.0 in | Wt 304.0 lb

## 2022-02-20 DIAGNOSIS — M4726 Other spondylosis with radiculopathy, lumbar region: Secondary | ICD-10-CM

## 2022-02-20 DIAGNOSIS — D1779 Benign lipomatous neoplasm of other sites: Secondary | ICD-10-CM

## 2022-02-20 DIAGNOSIS — M48061 Spinal stenosis, lumbar region without neurogenic claudication: Secondary | ICD-10-CM

## 2022-02-20 DIAGNOSIS — M5416 Radiculopathy, lumbar region: Secondary | ICD-10-CM | POA: Diagnosis not present

## 2022-02-20 DIAGNOSIS — M47816 Spondylosis without myelopathy or radiculopathy, lumbar region: Secondary | ICD-10-CM

## 2022-02-20 DIAGNOSIS — M5116 Intervertebral disc disorders with radiculopathy, lumbar region: Secondary | ICD-10-CM | POA: Diagnosis not present

## 2022-02-20 NOTE — Progress Notes (Signed)
Jeremy [name]. - 56 y.o. male MRN [id number]  Date of birth: June 14, 1966  Office Visit Note: Visit Date: 02/20/2022 PCP: Jeremy Pounds, NP Referred by: Jeremy Pounds, NP  Subjective: Chief Complaint  Patient presents with   Lower Back - Follow-up   HPI: Jeremy [name]. is a 56 y.o. male who comes in today for evaluation of chronic, worsening and severe right sided lower back pain radiating to right lateral thigh and medial aspect of right knee. Pain ongoing for 6 weeks or more. Patient denies exacerbating factors, states pain is intermittent. He describes pain as an aching and burning sensation, currently rates as 8 out of 10. Patient states difficulty sleeping due to severe pain. He reports some relief of pain with home exercise regimen, rest and use of medications. Does take Tylenol as needed. Patients recent lumbar MRI imaging exhibits multi level degenerative changes and epidural lipomatosis resulting in moderate to severe spinal canal stenosis at L3-L4 and L4-L5,  moderate canal stenosis at L5-S1. After review of lumbar MRI imaging there is a right paracentral disc protrusion noted at L4-L5 that is not dictated on report. Patient denies focal weakness, numbness and tingling. Patient denies recent trauma or falls.   Patients course is complicated by diabetes mellitus, chronic kidney disease, OSA, and morbid obesity. He attends hemodialysis Monday, Wednesday and Friday.      Review of Systems  Musculoskeletal:  Positive for back pain.  Neurological:  Negative for tingling, sensory change, focal weakness and weakness.  All other systems reviewed and are negative.  Otherwise per HPI.  Assessment & Plan: Visit Diagnoses:    ICD-10-CM   1. Lumbar radiculopathy  M54.16     2. Intervertebral disc disorders with radiculopathy, lumbar region  M51.16     3. Other spondylosis with radiculopathy, lumbar region  M47.26     4. Spinal stenosis of lumbar region without neurogenic  claudication  M48.061     5. Facet arthropathy, lumbar  M47.816     6. Epidural lipomatosis  D17.79        Plan: Findings:  Chronic, worsening and severe right-sided lower back pain radiating to right lateral thigh and medial aspect of right knee.  Patient continues to have severe pain despite good conservative therapy such as home exercise regimen, rest and use of medications.  Patient's clinical presentation and exam are consistent with L4 nerve pattern. I did review patient's recent lumbar MRI with him today using images and spine model. Lumbar MRI imaging does exhibit right paracentral disc protrusion at the level of L4-L5.  Next step is to perform a diagnostic and hopefully therapeutic right L4-L5 interlaminar epidural steroid injection under fluoroscopic guidance.  Patient is not currently taking anticoagulants.  I did discuss lumbar epidural steroid injection procedure with patient in detail, he has no further questions at this time.  If patient gets significant and sustained pain relief with lumbar injection I did explain that this procedure can be repeated infrequently as needed.  If his pain persists post injection we would consider referral to neurosurgeon for surgical consultation. Patient encouraged to remain active as tolerated. No red flag symptoms noted upon exam today.     Meds & Orders: No orders of the defined types were placed in this encounter.  No orders of the defined types were placed in this encounter.   Follow-up: Return for Right L4-L5 interlaminar epidural steroid injection.   Procedures: No procedures performed      Clinical History: EXAM:  MRI LUMBAR SPINE WITHOUT CONTRAST   TECHNIQUE: Multiplanar, multisequence MR imaging of the lumbar spine was performed. No intravenous contrast was administered.   COMPARISON:  Lumbar radiographs January 30, 2022.   FINDINGS: Segmentation: Standard segmentation is assumed. The inferior-most fully formed intervertebral disc  labeled L5-S1.   Alignment:  No substantial sagittal subluxation.   Vertebrae: Vertebral body heights are maintained. No focal marrow edema chest acute fracture discitis/osteomyelitis. No suspicious bone lesions.   Conus medullaris and cauda equina: Conus extends to the L1-L2 level. Conus appears normal.   Paraspinal and other soft tissues: Unremarkable.   Disc levels:   T12-L1: No significant disc protrusion, foraminal stenosis, or canal stenosis.   L1-L2: No significant disc protrusion, foraminal stenosis, or canal stenosis.   L2-L3: Mild disc bulging. No significant canal or foraminal stenosis.   L3-L4: Disc bulging, bilateral facet arthropathy, and mild prominence of the epidural fat. Resulting moderate to severe canal stenosis. Mild right greater than left foraminal stenosis.   L4-L5: Disc bulging with bilateral facet arthropathy. Mild superimposed prominence of the epidural fat. Resulting moderate to severe canal stenosis. Mild-to-moderate bilateral foraminal stenosis.   L5-S1: Mild bilateral facet arthropathy. Disc bulging with small annular fissure. Prominence of the epidural fat. Moderate canal stenosis. Patent foramina.   IMPRESSION: 1. Degenerative change and prominent epidural fat results in moderate to severe canal stenosis at L3-L4 L4-L5 and moderate canal stenosis at L5-S1. 2. Mild to moderate bilateral foraminal stenosis at L4-L5 and mild bilateral foraminal stenosis at L3-L4.     Electronically Signed   By: Jeremy [name] M.D.   On: 02/18/2022 09:09   He reports that he has never smoked. He has never used smokeless tobacco.  Recent Labs    04/03/21 1144 09/10/21 1531 02/04/22 1129  HGBA1C 12.9* 12.3* 11.5*    Objective:  VS:  HT:'5\' 8"'$  (172.7 cm)   WT: (!) 304 lb (137.9 kg)  BMI:46.23    BP: (!) 171/107  HR:97bpm  TEMP: ( )  RESP:  Physical Exam Vitals and nursing note reviewed.  HENT:     Head: Normocephalic and atraumatic.      Right Ear: External ear normal.     Left Ear: External ear normal.     Nose: Nose normal.     Mouth/Throat:     Mouth: Mucous membranes are moist.  Eyes:     Extraocular Movements: Extraocular movements intact.  Cardiovascular:     Rate and Rhythm: Normal rate.     Pulses: Normal pulses.  Pulmonary:     Effort: Pulmonary effort is normal.  Abdominal:     General: Abdomen is flat. There is no distension.  Musculoskeletal:        General: Tenderness present.     Cervical back: Normal range of motion.     Comments: Pt rises from seated position to standing without difficulty. Good lumbar range of motion. Strong distal strength without clonus, no pain upon palpation of greater trochanters. Dysesthesias noted to right L4 dermatome. Sensation intact bilaterally. Walks independently, gait steady. Positive slump test.    Skin:    General: Skin is warm and dry.     Capillary Refill: Capillary refill takes less than 2 seconds.  Neurological:     General: No focal deficit present.     Mental Status: He is alert and oriented to person, place, and time.  Psychiatric:        Mood and Affect: Mood normal.  Behavior: Behavior normal.     Ortho Exam  Imaging: No results found.  Past Medical/Family/Surgical/Social History: Medications & Allergies reviewed per EMR, new medications updated. Patient Active Problem List   Diagnosis Date Noted   Pain, unspecified 12/31/2021   Encounter for removal of sutures 10/05/2021   ESRD on dialysis ([place]) 11/09/2019   Allergy, unspecified, initial encounter 08/16/2019   Anaphylactic shock, unspecified, initial encounter 08/16/2019   Anemia in chronic kidney disease 26/37/8588   Complication of vascular dialysis catheter 08/16/2019   Fever, unspecified 08/16/2019   Iron deficiency anemia, unspecified 08/16/2019   Other specified coagulation defects ([place]) 08/16/2019   Pruritus, unspecified 08/16/2019   Shortness of breath 08/16/2019   Type 2  diabetes mellitus with diabetic peripheral angiopathy without gangrene ([place]) 08/16/2019   COVID-19 virus infection 08/08/2019   Acute right ankle pain 07/27/2019   Tinnitus 05/26/2019   OSA (obstructive sleep apnea) 10/07/2018   Resistant hypertension 09/23/2018   Secondary hyperparathyroidism ([place]) 04/06/2018   Pituitary incidentaloma 08/10/2017   Seizure ([place])    Bilateral lower extremity edema 04/08/2017   Uncontrolled type 2 diabetes mellitus with diabetic nephropathy, with long-term current use of insulin 03/03/2017   Chronic kidney disease (CKD), stage IV (severe) ([place]) 02/14/2014   Morbid obesity ([place]) 05/20/2013   Erectile dysfunction associated with type 2 diabetes mellitus ([place]) 06/02/2012   Macular edema 12/12/2011   Mixed hyperlipidemia due to type 2 diabetes mellitus ([place]) 11/11/2011   Past Medical History:  Diagnosis Date   Abscess    AKI (acute kidney injury) ([place]) 08/05/2017   Bell's palsy    right eye abn since dx bell's palsy   CKD (chronic kidney disease)    Dr. McDiarmind    Dyspnea    occasional with exertion   Headache    High cholesterol    Hypertension    Left shoulder pain 10/26/2013   S/p injection on 10/26/13    Renal insufficiency 02/14/2014   Seizures ([place]) 08/04/2017   "related to high blood sugars" (08/05/2017)   Type II diabetes mellitus ([place])    Family History  Problem Relation Age of Onset   Diabetes Mother    Heart disease Mother 6   Diabetes Sister    Diabetes Brother    Diabetes Brother    Cancer Maternal Uncle        type unknown   Stroke Neg Hx    Past Surgical History:  Procedure Laterality Date   AV FISTULA PLACEMENT Left 11/11/2019   Procedure: LEFT FOREARM RADIOCEPHALIC ARTERIOVENOUS (AV) FISTULA CREATION;  Surgeon: [name], MD;  [facility];  Service: Vascular;  Laterality: Left;   Eyelid surgery Right    I & D EXTREMITY  11/11/2011   Procedure: IRRIGATION AND DEBRIDEMENT EXTREMITY;  Surgeon: Merrie Roof,  MD;  [facility];  Service: General;  Laterality: Right;  I&D Right Thigh Abscess   IR FLUORO GUIDE CV LINE RIGHT  08/12/2019   IR US GUIDE VASC ACCESS RIGHT  08/12/2019   REVISON OF ARTERIOVENOUS FISTULA Left 03/23/2020   Procedure: LEFT ARM ARTERIOVENOUS FISTULA REVISON, LIGATION OF SIDE BRANCHES AND ELEVATION;  Surgeon: [name], MD;  [facility];  Service: Vascular;  Laterality: Left;   Social History   Occupational History   Occupation: maintenance tech   Occupation: Disablity  Tobacco Use   Smoking status: Never   Smokeless tobacco: Never  Vaping Use   Vaping Use: Never used  Substance and Sexual Activity  Alcohol use: Not Currently   Drug use: No   Sexual activity: Yes

## 2022-02-21 ENCOUNTER — Ambulatory Visit (INDEPENDENT_AMBULATORY_CARE_PROVIDER_SITE_OTHER): Payer: Medicare Other | Admitting: Adult Health

## 2022-02-21 ENCOUNTER — Encounter: Payer: Self-pay | Admitting: Adult Health

## 2022-02-21 VITALS — BP 157/96 | HR 85 | Ht 68.0 in | Wt 301.8 lb

## 2022-02-21 DIAGNOSIS — Z87898 Personal history of other specified conditions: Secondary | ICD-10-CM | POA: Diagnosis not present

## 2022-02-21 DIAGNOSIS — R569 Unspecified convulsions: Secondary | ICD-10-CM | POA: Diagnosis not present

## 2022-02-21 MED ORDER — LEVETIRACETAM ER 500 MG PO TB24
1000.0000 mg | ORAL_TABLET | Freq: Every day | ORAL | 1 refills | Status: DC
Start: 2022-02-21 — End: 2022-08-20

## 2022-02-21 NOTE — Progress Notes (Signed)
PATIENT: Jeremy [name]. DOB: [birth year]  REASON FOR VISIT: follow up HISTORY FROM: patient Primary neurologist: Dr. Felecia Shelling  Chief Complaint  Patient presents with   Follow-up    Pt in 5 pt here for seizure f/u  Pt reports no seizures.  Pt is having hip pain      HISTORY OF PRESENT ILLNESS: Today 02/21/22:  Mr. [name] is a 56 year old male with a history of seizures.  He returns today for follow-up.  He remains on Keppra extended release of 1000 mg daily.  Denies any seizure events.  Overall he is doing well.  Continues on dialysis.  Continues to operate a motor vehicle without difficulty.  Returns today for an evaluation.  02/07/2021: Mr. [name] is a 56 year old male with a history of seizures.  He returns today for follow-up.  He is on Keppra extended release 1000 mg daily.  He denies any seizure events.  He continues to operate a motor vehicle without difficulty.  Able to complete all ADLs independently.  Patient reports that he is on disability due to dialysis.  So far he has not had an issue with seizures after dialysis.  He returns today for an evaluation.    02/07/20: Mr. [name] is a 56 year old male with a history of seizures.  He returns today for follow-up.  He is on Keppra extended release.  His prescription was written for 750 mg 2 tablets daily.  However he states he has been breaking the tablets in half and taking a half a tablet in the morning and a half a tablet in the evening.  He states that the full dose makes him dizzy.  He denies any seizure events.  He operates a Teacher, music without difficulty.  He returns today for an evaluation.  HISTORY 02/04/19:   Mr. [name] is a 56 year old male with a history of seizures.  He returns today for follow-up.  He is currently on Keppra however he is unsure if he is on the extended release or the immediate release.  We will call his pharmacy to verify.  He reports he has not had any seizures.  He operates a Teacher, music without  difficulty.  Denies any changes in his gait or balance.  No change in his mood or behavior.  He returns today for follow-up.  REVIEW OF SYSTEMS: Out of a complete 14 system review of symptoms, the patient complains only of the following symptoms, and all other reviewed systems are negative.  See HPI  ALLERGIES: Allergies  Allergen Reactions   Amlodipine Other (See Comments)    Dizziness    HOME MEDICATIONS: Outpatient Medications Prior to Visit  Medication Sig Dispense Refill   amLODipine (NORVASC) 10 MG tablet TAKE 1 TABLET BY MOUTH EVERY DAY 90 tablet 0   aspirin EC ([facility] ASPIRIN LOW DOSE) 81 MG tablet TAKE 1 TABLET BY MOUTH EVERY DAY 90 tablet 0   atorvastatin (LIPITOR) 40 MG tablet Take 1 tablet (40 mg total) by mouth daily. 90 tablet 3   Blood Glucose Monitoring Suppl (CONTOUR NEXT MONITOR) w/Device KIT 1 Device by Does not apply route 4 (four) times daily - after meals and at bedtime. 1 kit 0   carvedilol (COREG) 3.125 MG tablet TAKE 1 TABLET (3.125 MG TOTAL) BY MOUTH 2 (TWO) TIMES DAILY WITH A MEAL. 180 tablet 0   carvedilol (COREG) 6.25 MG tablet Take 1 tablet (6.25 mg total) by mouth 2 (two) times daily with a meal. 180 tablet 0   cinacalcet (  SENSIPAR) 30 MG tablet Take by mouth.     cyclobenzaprine (FLEXERIL) 10 MG tablet Take 1 tablet (10 mg total) by mouth 3 (three) times daily as needed for muscle spasms. 30 tablet 0   enalapril (VASOTEC) 5 MG tablet Take 1 tablet (5 mg total) by mouth daily. 90 tablet 1   famotidine (PEPCID) 10 MG tablet Take 1 tablet (10 mg total) by mouth 2 (two) times daily. 60 tablet 1   fluticasone (FLONASE) 50 MCG/ACT nasal spray Place 1 spray into both nostrils daily. 16 g 0   FOSRENOL 1000 MG PACK Take 1 packet by mouth 3 (three) times daily.     glucose blood (TRUE METRIX BLOOD GLUCOSE TEST) test strip Use as instructed. Check blood glucose level by fingerstick 3 times per day. 200 each 12   hydrALAZINE (APRESOLINE) 25 MG tablet TAKE 1 TABLET BY  MOUTH THREE TIMES A DAY 270 tablet 0   HYDROcodone-acetaminophen (NORCO/VICODIN) 5-325 MG tablet Take 1 tablet by mouth every 6 (six) hours as needed for moderate pain. 12 tablet 0   insulin glargine-yfgn (SEMGLEE) 100 UNIT/ML Pen Inject 50 Units into the skin daily. 15 mL 3   insulin lispro (HUMALOG KWIKPEN) 100 UNIT/ML KwikPen Inject 6 units prior to all meals. 1 hr after meals inject according to scale. For blood sugars 0-150 give 0 units of insulin, 151-200 give 2 units of insulin, 201-250 give 4 units, 251-300 give 6 units, 301-350 give 8 units, 351-400 give 10 units,> 400 give 12 units 15 mL 11   insulin lispro protamine-lispro (HUMALOG MIX 75/25) (75-25) 100 UNIT/ML SUSP injection Inject 30 Units into the skin daily with breakfast AND 30 Units daily with supper. 18 mL 2   Insulin Pen Needle (B-D ULTRAFINE III SHORT PEN) 31G X 8 MM MISC 1 application. by Does not apply route 3 (three) times daily. E11.65 200 each 11   Lancet Devices (MICROLET NEXT LANCING DEVICE) MISC 1 Device by Does not apply route 3 (three) times daily. 1 each 11   Lanthanum Carbonate (FOSRENOL) 1000 MG PACK Take by mouth.     levETIRAcetam (KEPPRA XR) 500 MG 24 hr tablet TAKE 2 TABLETS BY MOUTH EVERY DAY 180 tablet 0   Microlet Lancets MISC 1 Device by Does not apply route 3 (three) times daily. E11.65 200 each 11   omeprazole (PRILOSEC) 20 MG capsule Take 1 capsule (20 mg total) by mouth daily. 30 capsule 3   topiramate (TOPAMAX) 50 MG tablet Take by mouth.     traZODone (DESYREL) 150 MG tablet Take 1 tablet (150 mg total) by mouth at bedtime. 90 tablet 2   VELPHORO 500 MG chewable tablet Chew 500 mg by mouth 3 (three) times daily.     No facility-administered medications prior to visit.    PAST MEDICAL HISTORY: Past Medical History:  Diagnosis Date   Abscess    AKI (acute kidney injury) ([place]) 08/05/2017   Bell's palsy    right eye abn since dx bell's palsy   CKD (chronic kidney disease)    Dr. McDiarmind     Dyspnea    occasional with exertion   Headache    High cholesterol    Hypertension    Left shoulder pain 10/26/2013   S/p injection on 10/26/13    Renal insufficiency 02/14/2014   Seizures ([place]) 08/04/2017   "related to high blood sugars" (08/05/2017)   Type II diabetes mellitus ([place])     PAST SURGICAL HISTORY: Past Surgical History:  Procedure  Laterality Date   AV FISTULA PLACEMENT Left 11/11/2019   Procedure: LEFT FOREARM RADIOCEPHALIC ARTERIOVENOUS (AV) FISTULA CREATION;  Surgeon: [name], MD;  Location: [facility];  Service: Vascular;  Laterality: Left;   Eyelid surgery Right    I & D EXTREMITY  11/11/2011   Procedure: IRRIGATION AND DEBRIDEMENT EXTREMITY;  Surgeon: Merrie Roof, MD;  Location: [facility];  Service: General;  Laterality: Right;  I&D Right Thigh Abscess   IR FLUORO GUIDE CV LINE RIGHT  08/12/2019   IR US GUIDE VASC ACCESS RIGHT  08/12/2019   REVISON OF ARTERIOVENOUS FISTULA Left 03/23/2020   Procedure: LEFT ARM ARTERIOVENOUS FISTULA REVISON, LIGATION OF SIDE BRANCHES AND ELEVATION;  Surgeon: [name], MD;  [facility];  Service: Vascular;  Laterality: Left;    FAMILY HISTORY: Family History  Problem Relation Age of Onset   Diabetes Mother    Heart disease Mother 48   Diabetes Sister    Diabetes Brother    Diabetes Brother    Cancer Maternal Uncle        type unknown   Stroke Neg Hx     SOCIAL HISTORY: Social History   Socioeconomic History   Marital status: Divorced    Spouse name: Not on file   Number of children: 3   Years of education: Not on file   Highest education level: Not on file  Occupational History   Occupation: maintenance tech   Occupation: Disablity  Tobacco Use   Smoking status: Never   Smokeless tobacco: Never  Vaping Use   Vaping Use: Never used  Substance and Sexual Activity   Alcohol use: Not Currently   Drug use: No   Sexual activity: Yes  Other Topics Concern   Not on file  Social History Narrative    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  Did not graduate high school.  Has 3 adult children in [place].  Divorced.  Lives with girlfriend [name].                   Social Determinants of Health   Financial Resource Strain: Not on file  Food Insecurity: Not on file  Transportation Needs: Not on file  Physical Activity: Unknown (08/31/2019)   Exercise Vital Sign    Days of Exercise per Week: 2 days    Minutes of Exercise per Session: Not on file  Stress: Not on file  Social Connections: Not on file  Intimate Partner Violence: Not on file      PHYSICAL EXAM  Vitals:   02/21/22 1023  BP: (!) 157/96  Pulse: 85  Weight: (!) 301 lb 12.8 oz (136.9 kg)  Height: _0  (1.727 m)    Body mass index is 45.89 kg/m.  Generalized: Well developed, in no acute distress   Neurological examination  Mentation: Alert oriented to time, place, history taking. Follows all commands speech and language fluent Cranial nerve II-XII: Pupils were equal round reactive to light. Extraocular movements were full, visual field were full on confrontational test.Head turning and shoulder shrug  were normal and symmetric. Motor: The motor testing reveals 5 over 5 strength of all 4 extremities. Good symmetric motor tone is noted throughout.  Sensory: Sensory testing is intact to soft touch on all 4 extremities. No evidence of extinction is noted.  Coordination: Cerebellar testing reveals good finger-nose-finger and heel-to-shin bilaterally.  Gait and station: Gait is normal.    DIAGNOSTIC DATA (LABS, IMAGING, TESTING) -  I reviewed patient records, labs, notes, testing and imaging myself where available.  Lab Results  Component Value Date   WBC 6.9 02/04/2022   HGB 14.3 02/04/2022   HCT 43.7 02/04/2022   MCV 79 02/04/2022   PLT 227 02/04/2022      Component Value Date/Time   NA 137 02/04/2022 1129   K 4.1 02/04/2022 1129   CL 92 (L) 02/04/2022 1129   CO2 28  02/04/2022 1129   GLUCOSE 340 (H) 02/04/2022 1129   GLUCOSE 133 (H) 09/07/2021 1016   BUN 24 02/04/2022 1129   CREATININE 4.44 (H) 02/04/2022 1129   CREATININE 2.77 (H) 05/31/2016 0844   CALCIUM 10.1 02/04/2022 1129   PROT 7.5 02/04/2022 1129   ALBUMIN 4.4 02/04/2022 1129   AST 15 02/04/2022 1129   ALT 15 02/04/2022 1129   ALKPHOS 81 02/04/2022 1129   BILITOT 0.3 02/04/2022 1129   GFRNONAA 13 (L) 09/07/2021 1016   GFRNONAA 26 (L) 05/31/2016 0844   GFRAA 15 (L) 08/18/2020 0925   GFRAA 30 (L) 05/31/2016 0844   Lab Results  Component Value Date   CHOL 261 (H) 02/04/2022   HDL 54 02/04/2022   LDLCALC 166 (H) 02/04/2022   TRIG 222 (H) 02/04/2022   CHOLHDL 4.8 02/04/2022   Lab Results  Component Value Date   HGBA1C 11.5 (H) 02/04/2022   Lab Results  Component Value Date   VITAMINB12 412 11/26/2013   Lab Results  Component Value Date   TSH 1.740 09/23/2018      ASSESSMENT AND PLAN 56 y.o. year old male  has a past medical history of Abscess, AKI (acute kidney injury) ([place]) (08/05/2017), Bell's palsy, CKD (chronic kidney disease), Dyspnea, Headache, High cholesterol, Hypertension, Left shoulder pain (10/26/2013), Renal insufficiency (02/14/2014), Seizures ([place]) (08/04/2017), and Type II diabetes mellitus ([place]). here with :  Seizures   Continue Keppra 500 mg extended release-2 tablets daily for total of 1000 mg daily Advised patient if he has any seizure events he should let us know Follow-up in 1 year or sooner if needed     Ward Givens, MSN, NP-C 02/21/2022, 10:17 AM [facility] [address] [place], [place] [address] [phone]

## 2022-03-12 ENCOUNTER — Other Ambulatory Visit: Payer: Self-pay | Admitting: *Deleted

## 2022-03-12 DIAGNOSIS — R600 Localized edema: Secondary | ICD-10-CM

## 2022-03-15 ENCOUNTER — Other Ambulatory Visit: Payer: Self-pay | Admitting: Family Medicine

## 2022-03-15 DIAGNOSIS — D688 Other specified coagulation defects: Secondary | ICD-10-CM

## 2022-03-18 NOTE — Progress Notes (Unsigned)
[facility] [place]  ASSESSMENT / PLAN: Jeremy [name]. is a 56 y.o. male with neuropathic type discomfort in the right lower extremity.  He has a established diagnosis of lumbar radiculopathy.  I counseled him that I think this is a consequence of the radiculopathy.  His venous reflux study is unremarkable.  He has an easily palpable pedal pulse.  He can follow-up with me as needed.  CHIEF COMPLAINT: Right leg discomfort  HISTORY OF PRESENT ILLNESS: Jeremy [name]. is a 56 y.o. male who presents to clinic for evaluation of right leg discomfort.  The patient does not have any chronic venous insufficiency symptoms.  He specifically denies swelling, throbbing, easy fatigability.  He has no symptoms of peripheral arterial disease.  He denies claudication, ischemic rest pain, or ischemic ulceration.  He reports electric type discomfort in his leg which only occurs when standing for long period of time.  The pain radiates down his buttock across his leg to his medial right knee.  Past Medical History:  Diagnosis Date   Abscess    AKI (acute kidney injury) ([place]) 08/05/2017   Bell's palsy    right eye abn since dx bell's palsy   CKD (chronic kidney disease)    Dr. McDiarmind    Dyspnea    occasional with exertion   Headache    High cholesterol    Hypertension    Left shoulder pain 10/26/2013   S/p injection on 10/26/13    Renal insufficiency 02/14/2014   Seizures ([place]) 08/04/2017   "related to high blood sugars" (08/05/2017)   Type II diabetes mellitus ([place])     Past Surgical History:  Procedure Laterality Date   AV FISTULA PLACEMENT Left 11/11/2019   Procedure: LEFT FOREARM RADIOCEPHALIC ARTERIOVENOUS (AV) FISTULA CREATION;  Surgeon: [name], MD;  [facility];  Service: Vascular;  Laterality: Left;   Eyelid surgery Right    I & D EXTREMITY  11/11/2011   Procedure: IRRIGATION AND DEBRIDEMENT EXTREMITY;  Surgeon: Merrie Roof, MD;  Location: [facility];   Service: General;  Laterality: Right;  I&D Right Thigh Abscess   IR FLUORO GUIDE CV LINE RIGHT  08/12/2019   IR US GUIDE VASC ACCESS RIGHT  08/12/2019   REVISON OF ARTERIOVENOUS FISTULA Left 03/23/2020   Procedure: LEFT ARM ARTERIOVENOUS FISTULA REVISON, LIGATION OF SIDE BRANCHES AND ELEVATION;  Surgeon: [name], MD;  Location: [facility];  Service: Vascular;  Laterality: Left;    Family History  Problem Relation Age of Onset   Diabetes Mother    Heart disease Mother 40   Diabetes Sister    Diabetes Brother    Diabetes Brother    Cancer Maternal Uncle        type unknown   Seizures Niece    Stroke Neg Hx     Social History   Socioeconomic History   Marital status: Divorced    Spouse name: Not on file   Number of children: 3   Years of education: Not on file   Highest education level: Not on file  Occupational History   Occupation: maintenance tech   Occupation: Disablity  Tobacco Use   Smoking status: Never   Smokeless tobacco: Never  Vaping Use   Vaping Use: Never used  Substance and Sexual Activity   Alcohol use: Not Currently   Drug use: No   Sexual activity: Yes  Other Topics Concern   Not on file  Social History Narrative  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  Did not graduate high school.  Has 3 adult children in [place].  Divorced.  Lives with girlfriend [name].                   Social Determinants of Health   Financial Resource Strain: Not on file  Food Insecurity: Not on file  Transportation Needs: Not on file  Physical Activity: Unknown (08/31/2019)   Exercise Vital Sign    Days of Exercise per Week: 2 days    Minutes of Exercise per Session: Not on file  Stress: Not on file  Social Connections: Not on file  Intimate Partner Violence: Not on file    Allergies  Allergen Reactions   Amlodipine Other (See Comments)    Dizziness    Current Outpatient Medications  Medication Sig Dispense Refill    amLODipine (NORVASC) 10 MG tablet TAKE 1 TABLET BY MOUTH EVERY DAY 90 tablet 0   ASPIRIN LOW DOSE 81 MG tablet TAKE 1 TABLET BY MOUTH EVERY DAY 90 tablet 0   atorvastatin (LIPITOR) 40 MG tablet Take 1 tablet (40 mg total) by mouth daily. 90 tablet 3   Blood Glucose Monitoring Suppl (CONTOUR NEXT MONITOR) w/Device KIT 1 Device by Does not apply route 4 (four) times daily - after meals and at bedtime. 1 kit 0   carvedilol (COREG) 3.125 MG tablet TAKE 1 TABLET (3.125 MG TOTAL) BY MOUTH 2 (TWO) TIMES DAILY WITH A MEAL. 180 tablet 0   carvedilol (COREG) 6.25 MG tablet Take 1 tablet (6.25 mg total) by mouth 2 (two) times daily with a meal. 180 tablet 0   cinacalcet (SENSIPAR) 30 MG tablet Take by mouth.     cyclobenzaprine (FLEXERIL) 10 MG tablet Take 1 tablet (10 mg total) by mouth 3 (three) times daily as needed for muscle spasms. 30 tablet 0   enalapril (VASOTEC) 5 MG tablet Take 1 tablet (5 mg total) by mouth daily. 90 tablet 1   famotidine (PEPCID) 10 MG tablet Take 1 tablet (10 mg total) by mouth 2 (two) times daily. 60 tablet 1   fluticasone (FLONASE) 50 MCG/ACT nasal spray Place 1 spray into both nostrils daily. 16 g 0   FOSRENOL 1000 MG PACK Take 1 packet by mouth 3 (three) times daily.     glucose blood (TRUE METRIX BLOOD GLUCOSE TEST) test strip Use as instructed. Check blood glucose level by fingerstick 3 times per day. 200 each 12   hydrALAZINE (APRESOLINE) 25 MG tablet TAKE 1 TABLET BY MOUTH THREE TIMES A DAY 270 tablet 0   HYDROcodone-acetaminophen (NORCO/VICODIN) 5-325 MG tablet Take 1 tablet by mouth every 6 (six) hours as needed for moderate pain. 12 tablet 0   insulin glargine-yfgn (SEMGLEE) 100 UNIT/ML Pen Inject 50 Units into the skin daily. 15 mL 3   insulin lispro (HUMALOG KWIKPEN) 100 UNIT/ML KwikPen Inject 6 units prior to all meals. 1 hr after meals inject according to scale. For blood sugars 0-150 give 0 units of insulin, 151-200 give 2 units of insulin, 201-250 give 4 units,  251-300 give 6 units, 301-350 give 8 units, 351-400 give 10 units,> 400 give 12 units 15 mL 11   insulin lispro protamine-lispro (HUMALOG MIX 75/25) (75-25) 100 UNIT/ML SUSP injection Inject 30 Units into the skin daily with breakfast AND 30 Units daily with supper. 18 mL 2   Insulin Pen Needle (B-D ULTRAFINE III SHORT PEN) 31G X 8 MM MISC 1 application. by  Does not apply route 3 (three) times daily. E11.65 200 each 11   Lancet Devices (MICROLET NEXT LANCING DEVICE) MISC 1 Device by Does not apply route 3 (three) times daily. 1 each 11   Lanthanum Carbonate (FOSRENOL) 1000 MG PACK Take by mouth.     levETIRAcetam (KEPPRA XR) 500 MG 24 hr tablet Take 2 tablets (1,000 mg total) by mouth daily. 180 tablet 1   Microlet Lancets MISC 1 Device by Does not apply route 3 (three) times daily. E11.65 200 each 11   omeprazole (PRILOSEC) 20 MG capsule Take 1 capsule (20 mg total) by mouth daily. 30 capsule 3   topiramate (TOPAMAX) 50 MG tablet Take by mouth.     traZODone (DESYREL) 150 MG tablet Take 1 tablet (150 mg total) by mouth at bedtime. 90 tablet 2   VELPHORO 500 MG chewable tablet Chew 500 mg by mouth 3 (three) times daily.     No current facility-administered medications for this visit.    PHYSICAL EXAM There were no vitals filed for this visit.  Well-appearing gentleman in no acute distress.  Obese. Regular rate and rhythm  Unlabored breathing 2+ dorsalis pedis pulse on the right. No stigmata of chronic venous insufficiency  PERTINENT LABORATORY AND RADIOLOGIC DATA  Most recent CBC    Latest Ref Rng & Units 02/04/2022   11:29 AM 09/07/2021   10:16 AM 02/23/2021    7:24 AM  CBC  WBC 3.4 - 10.8 x10E3/uL 6.9  8.8  7.9   Hemoglobin 13.0 - 17.7 g/dL 14.3  12.1  12.0   Hematocrit 37.5 - 51.0 % 43.7  37.6  37.1   Platelets 150 - 450 x10E3/uL 227  227  209      Most recent CMP    Latest Ref Rng & Units 02/04/2022   11:29 AM 09/07/2021   10:16 AM 02/23/2021    7:24 AM  CMP  Glucose 70 - 99  mg/dL 340  133  419   BUN 6 - 24 mg/dL 24  20  35   Creatinine 0.76 - 1.27 mg/dL 4.44  5.04  7.55   Sodium 134 - 144 mmol/L 137  135  133   Potassium 3.5 - 5.2 mmol/L 4.1  2.9  4.2   Chloride 96 - 106 mmol/L 92  92  91   CO2 20 - 29 mmol/L _0 Calcium 8.7 - 10.2 mg/dL 10.1  8.0  8.7   Total Protein 6.0 - 8.5 g/dL 7.5   7.4   Total Bilirubin 0.0 - 1.2 mg/dL 0.3   0.4   Alkaline Phos 44 - 121 IU/L 81   59   AST 0 - 40 IU/L 15   10   ALT 0 - 44 IU/L 15   11     Renal function CrCl cannot be calculated (Patient's most recent lab result is older than the maximum 21 days allowed.).  HbA1c, POC (controlled diabetic range) (%)  Date Value  04/03/2021 12.9 (A)   Hgb A1c MFr Bld (%)  Date Value  02/04/2022 11.5 (H)    LDL Chol Calc (NIH)  Date Value Ref Range Status  02/04/2022 166 (H) 0 - 99 mg/dL Final    Venous reflux study Right:  - No evidence of deep vein thrombosis seen in the right lower extremity,  from the common femoral through the popliteal veins.  - No evidence of superficial venous thrombosis in the right lower  extremity.  -  No significant relux seen in the deep or superficial veins.   Jeremy [name]. Stanford Breed, MD [facility] Phone Number: [phone] 03/18/2022 2:37 PM  Total time spent on preparing this encounter including chart review, data review, collecting history, examining the patient, coordinating care for this new patient, 45 minutes.  Portions of this report may have been transcribed using voice recognition software.  Every effort has been made to ensure accuracy; however, inadvertent computerized transcription errors may still be present.

## 2022-03-19 ENCOUNTER — Ambulatory Visit (HOSPITAL_COMMUNITY)
Admission: RE | Admit: 2022-03-19 | Discharge: 2022-03-19 | Disposition: A | Discharge status: Home / Self Care | Payer: Medicare Other | Source: Ambulatory Visit | Attending: Vascular Surgery | Admitting: Vascular Surgery

## 2022-03-19 ENCOUNTER — Encounter: Payer: Self-pay | Admitting: Vascular Surgery

## 2022-03-19 ENCOUNTER — Ambulatory Visit (INDEPENDENT_AMBULATORY_CARE_PROVIDER_SITE_OTHER): Payer: Medicare Other | Admitting: Vascular Surgery

## 2022-03-19 VITALS — BP 154/87 | HR 72 | Temp 97.8°F | Resp 20 | Ht 68.0 in | Wt 301.0 lb

## 2022-03-19 DIAGNOSIS — R600 Localized edema: Secondary | ICD-10-CM | POA: Diagnosis present

## 2022-03-19 DIAGNOSIS — M5416 Radiculopathy, lumbar region: Secondary | ICD-10-CM | POA: Diagnosis not present

## 2022-03-20 ENCOUNTER — Ambulatory Visit: Payer: Self-pay

## 2022-03-20 ENCOUNTER — Ambulatory Visit (INDEPENDENT_AMBULATORY_CARE_PROVIDER_SITE_OTHER): Payer: Managed Care, Other (non HMO) | Admitting: Physical Medicine and Rehabilitation

## 2022-03-20 ENCOUNTER — Encounter: Payer: Self-pay | Admitting: Physical Medicine and Rehabilitation

## 2022-03-20 VITALS — BP 187/93 | HR 83

## 2022-03-20 DIAGNOSIS — M5416 Radiculopathy, lumbar region: Secondary | ICD-10-CM | POA: Diagnosis not present

## 2022-03-20 MED ORDER — METHYLPREDNISOLONE ACETATE 80 MG/ML IJ SUSP
40.0000 mg | Freq: Once | INTRAMUSCULAR | Status: AC
  Administered 2022-03-20: 40 mg

## 2022-03-20 NOTE — Progress Notes (Unsigned)
Pt state lower back pain that travels down right hip, knee and leg. Pt state laying down makes the pain worse. Pt state he take [place] meds to help ease his pain.  Numeric Pain Rating Scale and Functional Assessment Average Pain 3   In the last MONTH (on 0-10 scale) has pain interfered with the following?  1. General activity like being  able to carry out your everyday physical activities such as walking, climbing stairs, carrying groceries, or moving a chair?  Rating(10)   +Driver, -BT, -Dye Allergies.

## 2022-03-20 NOTE — Patient Instructions (Signed)

## 2022-03-21 NOTE — Procedures (Signed)
No note

## 2022-03-21 NOTE — Procedures (Signed)
Lumbar Epidural Steroid Injection - Interlaminar Approach with Fluoroscopic Guidance  Patient: Jeremy [name].      Date of Birth: [birth year] MRN: [id number] PCP: Gildardo Pounds, NP      Visit Date: 03/20/2022   Universal Protocol:     Consent Given By: the patient  Position: PRONE  Additional Comments: Vital signs were monitored before and after the procedure. Patient was prepped and draped in the usual sterile fashion. The correct patient, procedure, and site was verified.   Injection Procedure Details:   Procedure diagnoses: Lumbar radiculopathy [M54.16]   Meds Administered:  Meds ordered this encounter  Medications   methylPREDNISolone acetate (DEPO-MEDROL) injection 40 mg     Laterality: Right  Location/Site:  L4-5  Needle: 4.5 in., 20 ga. Tuohy  Needle Placement: Paramedian epidural  Findings:   -Comments: Excellent flow of contrast into the epidural space.  Procedure Details: Using a paramedian approach from the side mentioned above, the region overlying the inferior lamina was localized under fluoroscopic visualization and the soft tissues overlying this structure were infiltrated with 4 ml. of 1% Lidocaine without Epinephrine. The Tuohy needle was inserted into the epidural space using a paramedian approach.   The epidural space was localized using loss of resistance along with counter oblique bi-planar fluoroscopic views.  After negative aspirate for air, blood, and CSF, a 2 ml. volume of Isovue-250 was injected into the epidural space and the flow of contrast was observed. Radiographs were obtained for documentation purposes.    The injectate was administered into the level noted above.   Additional Comments:  The patient tolerated the procedure well Dressing: 2 x 2 sterile gauze and Band-Aid    Post-procedure details: Patient was observed during the procedure. Post-procedure instructions were reviewed.  Patient left the clinic in stable  condition.

## 2022-03-21 NOTE — Progress Notes (Signed)
[name] - 56 y.o. male MRN [id number]  Date of birth: 08/20/1965  Office Visit Note: Visit Date: 03/20/2022 PCP: Gildardo Pounds, NP Referred by: Lorine Bears, NP  Subjective: Chief Complaint  Patient presents with   Lower Back - Pain   Right Hip - Pain   HPI:  Vivian Okelley. is a 56 y.o. male who comes in today at the request of Barnet Pall, FNP for planned Right L4-5 Lumbar Interlaminar epidural steroid injection with fluoroscopic guidance.  The patient has failed conservative care including home exercise, medications, time and activity modification.  This injection will be diagnostic and hopefully therapeutic.  Please see requesting physician notes for further details and justification.   ROS Otherwise per HPI.  Assessment & Plan: Visit Diagnoses:    ICD-10-CM   1. Lumbar radiculopathy  M54.16 XR C-ARM NO REPORT    methylPREDNISolone acetate (DEPO-MEDROL) injection 40 mg    Epidural Steroid injection    CANCELED: Radiofrequency,Lumbar      Plan: No additional findings.   Meds & Orders:  Meds ordered this encounter  Medications   methylPREDNISolone acetate (DEPO-MEDROL) injection 40 mg    Orders Placed This Encounter  Procedures   XR C-ARM NO REPORT   Epidural Steroid injection    Follow-up: Return for visit to requesting provider as needed.   Procedures: No procedures performed  (No note.)  Lumbar Epidural Steroid Injection - Interlaminar Approach with Fluoroscopic Guidance  Patient: Christpher Stogsdill.      Date of Birth: 1965-09-05 MRN: [id number] PCP: Gildardo Pounds, NP      Visit Date: 03/20/2022   Universal Protocol:     Consent Given By: the patient  Position: PRONE  Additional Comments: Vital signs were monitored before and after the procedure. Patient was prepped and draped in the usual sterile fashion. The correct patient, procedure, and site was verified.   Injection Procedure Details:   Procedure diagnoses: Lumbar  radiculopathy [M54.16]   Meds Administered:  Meds ordered this encounter  Medications   methylPREDNISolone acetate (DEPO-MEDROL) injection 40 mg     Laterality: Right  Location/Site:  L4-5  Needle: 4.5 in., 20 ga. Tuohy  Needle Placement: Paramedian epidural  Findings:   -Comments: Excellent flow of contrast into the epidural space.  Procedure Details: Using a paramedian approach from the side mentioned above, the region overlying the inferior lamina was localized under fluoroscopic visualization and the soft tissues overlying this structure were infiltrated with 4 ml. of 1% Lidocaine without Epinephrine. The Tuohy needle was inserted into the epidural space using a paramedian approach.   The epidural space was localized using loss of resistance along with counter oblique bi-planar fluoroscopic views.  After negative aspirate for air, blood, and CSF, a 2 ml. volume of Isovue-250 was injected into the epidural space and the flow of contrast was observed. Radiographs were obtained for documentation purposes.    The injectate was administered into the level noted above.   Additional Comments:  The patient tolerated the procedure well Dressing: 2 x 2 sterile gauze and Band-Aid    Post-procedure details: Patient was observed during the procedure. Post-procedure instructions were reviewed.  Patient left the clinic in stable condition.   Clinical History: EXAM: MRI LUMBAR SPINE WITHOUT CONTRAST   TECHNIQUE: Multiplanar, multisequence MR imaging of the lumbar spine was performed. No intravenous contrast was administered.   COMPARISON:  Lumbar radiographs January 30, 2022.   FINDINGS: Segmentation: Standard segmentation is assumed. The inferior-most fully  formed intervertebral disc labeled L5-S1.   Alignment:  No substantial sagittal subluxation.   Vertebrae: Vertebral body heights are maintained. No focal marrow edema chest acute fracture discitis/osteomyelitis. No  suspicious bone lesions.   Conus medullaris and cauda equina: Conus extends to the L1-L2 level. Conus appears normal.   Paraspinal and other soft tissues: Unremarkable.   Disc levels:   T12-L1: No significant disc protrusion, foraminal stenosis, or canal stenosis.   L1-L2: No significant disc protrusion, foraminal stenosis, or canal stenosis.   L2-L3: Mild disc bulging. No significant canal or foraminal stenosis.   L3-L4: Disc bulging, bilateral facet arthropathy, and mild prominence of the epidural fat. Resulting moderate to severe canal stenosis. Mild right greater than left foraminal stenosis.   L4-L5: Disc bulging with bilateral facet arthropathy. Mild superimposed prominence of the epidural fat. Resulting moderate to severe canal stenosis. Mild-to-moderate bilateral foraminal stenosis.   L5-S1: Mild bilateral facet arthropathy. Disc bulging with small annular fissure. Prominence of the epidural fat. Moderate canal stenosis. Patent foramina.   IMPRESSION: 1. Degenerative change and prominent epidural fat results in moderate to severe canal stenosis at L3-L4 L4-L5 and moderate canal stenosis at L5-S1. 2. Mild to moderate bilateral foraminal stenosis at L4-L5 and mild bilateral foraminal stenosis at L3-L4.     Electronically Signed   By: Margaretha Sheffield M.D.   On: 02/18/2022 09:09     Objective:  VS:  HT:    WT:   BMI:     BP:(!) 187/93  HR:83bpm  TEMP: ( )  RESP:  Physical Exam Vitals and nursing note reviewed.  Constitutional:      General: He is not in acute distress.    Appearance: Normal appearance. He is obese. He is not ill-appearing.  HENT:     Head: Normocephalic and atraumatic.     Right Ear: External ear normal.     Left Ear: External ear normal.     Nose: No congestion.  Eyes:     Extraocular Movements: Extraocular movements intact.  Cardiovascular:     Rate and Rhythm: Normal rate.     Pulses: Normal pulses.  Pulmonary:     Effort:  Pulmonary effort is normal. No respiratory distress.  Abdominal:     General: There is no distension.     Palpations: Abdomen is soft.  Musculoskeletal:        General: No tenderness or signs of injury.     Cervical back: Neck supple.     Right lower leg: No edema.     Left lower leg: No edema.     Comments: Patient has good distal strength without clonus.  Skin:    Findings: No erythema or rash.  Neurological:     General: No focal deficit present.     Mental Status: He is alert and oriented to person, place, and time.     Sensory: No sensory deficit.     Motor: No weakness or abnormal muscle tone.     Coordination: Coordination normal.  Psychiatric:        Mood and Affect: Mood normal.        Behavior: Behavior normal.      Imaging: No results found.

## 2022-03-28 ENCOUNTER — Ambulatory Visit: Payer: Self-pay | Admitting: Licensed Clinical Social Worker

## 2022-03-28 NOTE — Patient Outreach (Signed)
  Care Coordination   03/28/2022 Name: Jeremy [name]. MRN: [id number] DOB: 08-23-1965   Care Coordination Outreach Attempts:  An unsuccessful telephone outreach was attempted today to offer the patient information about available care coordination services as a benefit of their health plan.   Follow Up Plan:  Additional outreach attempts will be made to offer the patient care coordination information and services.   Encounter Outcome:  No Answer  Care Coordination Interventions Activated:  No   Care Coordination Interventions:  No, not indicated    Christa See, MSW, [email or web address] Phone [phone] 2:21 PM

## 2022-04-02 ENCOUNTER — Ambulatory Visit: Payer: Self-pay

## 2022-04-02 NOTE — Patient Outreach (Signed)
  Care Coordination   04/02/2022 Name: Jeremy [name]. MRN: [id number] DOB: [birth year]   Care Coordination Outreach Attempts:  A second unsuccessful outreach was attempted today to offer the patient with information about available care coordination services as a benefit of their health plan.     Follow Up Plan:  Additional outreach attempts will be made to offer the patient care coordination information and services.   Encounter Outcome:  No Answer  Care Coordination Interventions Activated:  No   Care Coordination Interventions:  No, not indicated    Daneen Schick, BSW, CDP Social Worker, Certified Dementia Practitioner [facility] Coordination [phone]

## 2022-04-22 DIAGNOSIS — Z23 Encounter for immunization: Secondary | ICD-10-CM | POA: Diagnosis not present

## 2022-04-22 DIAGNOSIS — Z992 Dependence on renal dialysis: Secondary | ICD-10-CM | POA: Diagnosis not present

## 2022-04-22 DIAGNOSIS — N186 End stage renal disease: Secondary | ICD-10-CM | POA: Diagnosis not present

## 2022-04-22 DIAGNOSIS — E1129 Type 2 diabetes mellitus with other diabetic kidney complication: Secondary | ICD-10-CM | POA: Diagnosis not present

## 2022-04-22 DIAGNOSIS — D509 Iron deficiency anemia, unspecified: Secondary | ICD-10-CM | POA: Diagnosis not present

## 2022-04-22 DIAGNOSIS — N2581 Secondary hyperparathyroidism of renal origin: Secondary | ICD-10-CM | POA: Diagnosis not present

## 2022-04-23 ENCOUNTER — Encounter: Payer: Self-pay | Admitting: Podiatry

## 2022-04-23 ENCOUNTER — Ambulatory Visit (INDEPENDENT_AMBULATORY_CARE_PROVIDER_SITE_OTHER): Payer: Medicare Other | Admitting: Podiatry

## 2022-04-23 DIAGNOSIS — M79674 Pain in right toe(s): Secondary | ICD-10-CM

## 2022-04-23 DIAGNOSIS — Z992 Dependence on renal dialysis: Secondary | ICD-10-CM

## 2022-04-23 DIAGNOSIS — M79675 Pain in left toe(s): Secondary | ICD-10-CM

## 2022-04-23 DIAGNOSIS — E0822 Diabetes mellitus due to underlying condition with diabetic chronic kidney disease: Secondary | ICD-10-CM | POA: Diagnosis not present

## 2022-04-23 DIAGNOSIS — B351 Tinea unguium: Secondary | ICD-10-CM | POA: Diagnosis not present

## 2022-04-23 DIAGNOSIS — N186 End stage renal disease: Secondary | ICD-10-CM

## 2022-04-23 DIAGNOSIS — Z794 Long term (current) use of insulin: Secondary | ICD-10-CM

## 2022-04-23 NOTE — Progress Notes (Signed)
  Subjective:  Patient ID: Jeremy Honour., male    DOB: January 16, 1966,  MRN: [id number]  Jeremy Honour. presents to clinic today for:  Chief Complaint  Patient presents with   Follow-up    Patient is here today for hjis diabetic foot care. Patient didn't check his blood sugar this morning. Last A1c on 02/02/22 at 11.5 Patient PCP is [name]; last seen on 01/29/22   New problem(s): None.   He will be driving to [place] to hear his sister speak at a program.  Allergies  Allergen Reactions   Amlodipine Other (See Comments)    Dizziness    Review of Systems: Negative except as noted in the HPI.  Objective:  Jeremy Bok. is a pleasant 56 y.o. male morbidly obese in NAD. AAO x 3. Vascular CFT <3 seconds b/l LE. Palpable DP pulse(s) b/l LE. Palpable PT pulse(s) b/l LE. Pedal hair sparse. No pain with calf compression b/l. Lower extremity skin temperature gradient within normal limits. No edema noted b/l LE. No cyanosis or clubbing noted.  Neurologic Normal speech. Protective sensation intact 5/5 intact bilaterally with 10g monofilament b/l.  Dermatologic Pedal integument with normal turgor, texture and tone BLE. No open wounds b/l LE. No interdigital macerations noted b/l LE. Toenails 1-5 b/l elongated, discolored, dystrophic, thickened, crumbly with subungual debris and tenderness to dorsal palpation. No hyperkeratotic nor porokeratotic lesions present on today's visit.  Orthopedic Muscle strength 5/5 to all lower extremity muscle groups bilaterally. No pain, crepitus or joint limitation noted with ROM bilateral LE. Pes planus foot deformity b/l.   Assessment/Plan: 1. Pain due to onychomycosis of toenails of both feet   2. Diabetes mellitus due to underlying condition with chronic kidney disease on chronic dialysis, with long-term current use of insulin ([place])     No orders of the defined types were placed in this encounter.   -Consent given for treatment as described  below: -Examined patient. -Continue foot and shoe inspections daily. Monitor blood glucose per PCP/Endocrinologist's recommendations. -Mycotic toenails 1-5 bilaterally were debrided in length and girth with sterile nail nippers and dremel without incident. -Patient/POA to call should there be question/concern in the interim.   Return in about 3 months (around 07/24/2022).  Marzetta Board, DPM

## 2022-04-24 DIAGNOSIS — N2581 Secondary hyperparathyroidism of renal origin: Secondary | ICD-10-CM | POA: Diagnosis not present

## 2022-04-24 DIAGNOSIS — N186 End stage renal disease: Secondary | ICD-10-CM | POA: Diagnosis not present

## 2022-04-24 DIAGNOSIS — Z23 Encounter for immunization: Secondary | ICD-10-CM | POA: Diagnosis not present

## 2022-04-24 DIAGNOSIS — E1129 Type 2 diabetes mellitus with other diabetic kidney complication: Secondary | ICD-10-CM | POA: Diagnosis not present

## 2022-04-24 DIAGNOSIS — D509 Iron deficiency anemia, unspecified: Secondary | ICD-10-CM | POA: Diagnosis not present

## 2022-04-24 DIAGNOSIS — Z992 Dependence on renal dialysis: Secondary | ICD-10-CM | POA: Diagnosis not present

## 2022-04-26 DIAGNOSIS — N2581 Secondary hyperparathyroidism of renal origin: Secondary | ICD-10-CM | POA: Diagnosis not present

## 2022-04-26 DIAGNOSIS — Z23 Encounter for immunization: Secondary | ICD-10-CM | POA: Diagnosis not present

## 2022-04-26 DIAGNOSIS — N186 End stage renal disease: Secondary | ICD-10-CM | POA: Diagnosis not present

## 2022-04-26 DIAGNOSIS — E1129 Type 2 diabetes mellitus with other diabetic kidney complication: Secondary | ICD-10-CM | POA: Diagnosis not present

## 2022-04-26 DIAGNOSIS — Z992 Dependence on renal dialysis: Secondary | ICD-10-CM | POA: Diagnosis not present

## 2022-04-26 DIAGNOSIS — D509 Iron deficiency anemia, unspecified: Secondary | ICD-10-CM | POA: Diagnosis not present

## 2022-04-29 DIAGNOSIS — E1129 Type 2 diabetes mellitus with other diabetic kidney complication: Secondary | ICD-10-CM | POA: Diagnosis not present

## 2022-04-29 DIAGNOSIS — N2581 Secondary hyperparathyroidism of renal origin: Secondary | ICD-10-CM | POA: Diagnosis not present

## 2022-04-29 DIAGNOSIS — D509 Iron deficiency anemia, unspecified: Secondary | ICD-10-CM | POA: Diagnosis not present

## 2022-04-29 DIAGNOSIS — N186 End stage renal disease: Secondary | ICD-10-CM | POA: Diagnosis not present

## 2022-04-29 DIAGNOSIS — Z992 Dependence on renal dialysis: Secondary | ICD-10-CM | POA: Diagnosis not present

## 2022-04-29 DIAGNOSIS — Z23 Encounter for immunization: Secondary | ICD-10-CM | POA: Diagnosis not present

## 2022-04-30 ENCOUNTER — Ambulatory Visit: Payer: Self-pay

## 2022-04-30 NOTE — Patient Outreach (Signed)
  Care Coordination   04/30/2022 Name: Jeremy [name]. MRN: [id number] DOB: 07-Nov-1965   Care Coordination Outreach Attempts:  A third unsuccessful outreach was attempted today to offer the patient with information about available care coordination services as a benefit of their health plan.   Follow Up Plan:  No further outreach attempts will be made at this time. We have been unable to contact the patient to offer or enroll patient in care coordination services  Encounter Outcome:  No Answer  Care Coordination Interventions Activated:  No   Care Coordination Interventions:  No, not indicated    Daneen Schick, BSW, CDP Social Worker, Certified Dementia Practitioner [facility] [phone]

## 2022-05-01 DIAGNOSIS — Z23 Encounter for immunization: Secondary | ICD-10-CM | POA: Diagnosis not present

## 2022-05-01 DIAGNOSIS — N186 End stage renal disease: Secondary | ICD-10-CM | POA: Diagnosis not present

## 2022-05-01 DIAGNOSIS — Z992 Dependence on renal dialysis: Secondary | ICD-10-CM | POA: Diagnosis not present

## 2022-05-01 DIAGNOSIS — D509 Iron deficiency anemia, unspecified: Secondary | ICD-10-CM | POA: Diagnosis not present

## 2022-05-01 DIAGNOSIS — E1129 Type 2 diabetes mellitus with other diabetic kidney complication: Secondary | ICD-10-CM | POA: Diagnosis not present

## 2022-05-01 DIAGNOSIS — N2581 Secondary hyperparathyroidism of renal origin: Secondary | ICD-10-CM | POA: Diagnosis not present

## 2022-05-04 DIAGNOSIS — Z992 Dependence on renal dialysis: Secondary | ICD-10-CM | POA: Diagnosis not present

## 2022-05-04 DIAGNOSIS — N186 End stage renal disease: Secondary | ICD-10-CM | POA: Diagnosis not present

## 2022-05-04 DIAGNOSIS — N2581 Secondary hyperparathyroidism of renal origin: Secondary | ICD-10-CM | POA: Diagnosis not present

## 2022-05-04 DIAGNOSIS — D509 Iron deficiency anemia, unspecified: Secondary | ICD-10-CM | POA: Diagnosis not present

## 2022-05-04 DIAGNOSIS — Z23 Encounter for immunization: Secondary | ICD-10-CM | POA: Diagnosis not present

## 2022-05-04 DIAGNOSIS — E1129 Type 2 diabetes mellitus with other diabetic kidney complication: Secondary | ICD-10-CM | POA: Diagnosis not present

## 2022-05-06 DIAGNOSIS — Z992 Dependence on renal dialysis: Secondary | ICD-10-CM | POA: Diagnosis not present

## 2022-05-06 DIAGNOSIS — N2581 Secondary hyperparathyroidism of renal origin: Secondary | ICD-10-CM | POA: Diagnosis not present

## 2022-05-06 DIAGNOSIS — E1129 Type 2 diabetes mellitus with other diabetic kidney complication: Secondary | ICD-10-CM | POA: Diagnosis not present

## 2022-05-06 DIAGNOSIS — Z23 Encounter for immunization: Secondary | ICD-10-CM | POA: Diagnosis not present

## 2022-05-06 DIAGNOSIS — D509 Iron deficiency anemia, unspecified: Secondary | ICD-10-CM | POA: Diagnosis not present

## 2022-05-06 DIAGNOSIS — N186 End stage renal disease: Secondary | ICD-10-CM | POA: Diagnosis not present

## 2022-05-08 DIAGNOSIS — Z992 Dependence on renal dialysis: Secondary | ICD-10-CM | POA: Diagnosis not present

## 2022-05-08 DIAGNOSIS — N186 End stage renal disease: Secondary | ICD-10-CM | POA: Diagnosis not present

## 2022-05-08 DIAGNOSIS — E1129 Type 2 diabetes mellitus with other diabetic kidney complication: Secondary | ICD-10-CM | POA: Diagnosis not present

## 2022-05-08 DIAGNOSIS — N2581 Secondary hyperparathyroidism of renal origin: Secondary | ICD-10-CM | POA: Diagnosis not present

## 2022-05-08 DIAGNOSIS — Z23 Encounter for immunization: Secondary | ICD-10-CM | POA: Diagnosis not present

## 2022-05-08 DIAGNOSIS — D509 Iron deficiency anemia, unspecified: Secondary | ICD-10-CM | POA: Diagnosis not present

## 2022-05-10 DIAGNOSIS — N186 End stage renal disease: Secondary | ICD-10-CM | POA: Diagnosis not present

## 2022-05-10 DIAGNOSIS — Z992 Dependence on renal dialysis: Secondary | ICD-10-CM | POA: Diagnosis not present

## 2022-05-10 DIAGNOSIS — N2581 Secondary hyperparathyroidism of renal origin: Secondary | ICD-10-CM | POA: Diagnosis not present

## 2022-05-10 DIAGNOSIS — Z23 Encounter for immunization: Secondary | ICD-10-CM | POA: Diagnosis not present

## 2022-05-10 DIAGNOSIS — D509 Iron deficiency anemia, unspecified: Secondary | ICD-10-CM | POA: Diagnosis not present

## 2022-05-10 DIAGNOSIS — E1129 Type 2 diabetes mellitus with other diabetic kidney complication: Secondary | ICD-10-CM | POA: Diagnosis not present

## 2022-05-13 DIAGNOSIS — N2581 Secondary hyperparathyroidism of renal origin: Secondary | ICD-10-CM | POA: Diagnosis not present

## 2022-05-13 DIAGNOSIS — Z992 Dependence on renal dialysis: Secondary | ICD-10-CM | POA: Diagnosis not present

## 2022-05-13 DIAGNOSIS — N186 End stage renal disease: Secondary | ICD-10-CM | POA: Diagnosis not present

## 2022-05-13 DIAGNOSIS — E1129 Type 2 diabetes mellitus with other diabetic kidney complication: Secondary | ICD-10-CM | POA: Diagnosis not present

## 2022-05-13 DIAGNOSIS — Z23 Encounter for immunization: Secondary | ICD-10-CM | POA: Diagnosis not present

## 2022-05-13 DIAGNOSIS — D509 Iron deficiency anemia, unspecified: Secondary | ICD-10-CM | POA: Diagnosis not present

## 2022-05-14 DIAGNOSIS — Z992 Dependence on renal dialysis: Secondary | ICD-10-CM | POA: Diagnosis not present

## 2022-05-14 DIAGNOSIS — N186 End stage renal disease: Secondary | ICD-10-CM | POA: Diagnosis not present

## 2022-05-14 DIAGNOSIS — I871 Compression of vein: Secondary | ICD-10-CM | POA: Diagnosis not present

## 2022-05-15 DIAGNOSIS — N2581 Secondary hyperparathyroidism of renal origin: Secondary | ICD-10-CM | POA: Diagnosis not present

## 2022-05-15 DIAGNOSIS — D509 Iron deficiency anemia, unspecified: Secondary | ICD-10-CM | POA: Diagnosis not present

## 2022-05-15 DIAGNOSIS — Z23 Encounter for immunization: Secondary | ICD-10-CM | POA: Diagnosis not present

## 2022-05-15 DIAGNOSIS — E1129 Type 2 diabetes mellitus with other diabetic kidney complication: Secondary | ICD-10-CM | POA: Diagnosis not present

## 2022-05-15 DIAGNOSIS — N186 End stage renal disease: Secondary | ICD-10-CM | POA: Diagnosis not present

## 2022-05-15 DIAGNOSIS — Z992 Dependence on renal dialysis: Secondary | ICD-10-CM | POA: Diagnosis not present

## 2022-05-16 ENCOUNTER — Encounter: Payer: Self-pay | Admitting: Physician Assistant

## 2022-05-16 ENCOUNTER — Ambulatory Visit: Payer: Medicare Other | Attending: Critical Care Medicine | Admitting: Physician Assistant

## 2022-05-16 ENCOUNTER — Ambulatory Visit: Payer: Managed Care, Other (non HMO) | Admitting: Critical Care Medicine

## 2022-05-16 VITALS — BP 179/96 | HR 78 | Ht 68.0 in | Wt 300.6 lb

## 2022-05-16 DIAGNOSIS — I10 Essential (primary) hypertension: Secondary | ICD-10-CM | POA: Diagnosis not present

## 2022-05-16 DIAGNOSIS — E782 Mixed hyperlipidemia: Secondary | ICD-10-CM

## 2022-05-16 DIAGNOSIS — E1169 Type 2 diabetes mellitus with other specified complication: Secondary | ICD-10-CM

## 2022-05-16 DIAGNOSIS — R053 Chronic cough: Secondary | ICD-10-CM

## 2022-05-16 DIAGNOSIS — D688 Other specified coagulation defects: Secondary | ICD-10-CM

## 2022-05-16 DIAGNOSIS — Z91148 Patient's other noncompliance with medication regimen for other reason: Secondary | ICD-10-CM | POA: Diagnosis not present

## 2022-05-16 DIAGNOSIS — Z794 Long term (current) use of insulin: Secondary | ICD-10-CM | POA: Diagnosis not present

## 2022-05-16 DIAGNOSIS — E1151 Type 2 diabetes mellitus with diabetic peripheral angiopathy without gangrene: Secondary | ICD-10-CM

## 2022-05-16 DIAGNOSIS — Z992 Dependence on renal dialysis: Secondary | ICD-10-CM

## 2022-05-16 DIAGNOSIS — N186 End stage renal disease: Secondary | ICD-10-CM | POA: Diagnosis not present

## 2022-05-16 LAB — POCT GLYCOSYLATED HEMOGLOBIN (HGB A1C): HbA1c, POC (controlled diabetic range): 13.4 % — AB (ref 0.0–7.0)

## 2022-05-16 LAB — GLUCOSE, POCT (MANUAL RESULT ENTRY): POC Glucose: 232 mg/dl — AB (ref 70–99)

## 2022-05-16 MED ORDER — CARVEDILOL 3.125 MG PO TABS: 3.1250 mg | ORAL_TABLET | Freq: Two times a day (BID) | ORAL | 0 refills | Status: DC

## 2022-05-16 MED ORDER — INSULIN GLARGINE-YFGN 100 UNIT/ML ~~LOC~~ SOPN
50.0000 [IU] | PEN_INJECTOR | Freq: Every day | SUBCUTANEOUS | 3 refills | Status: DC
Start: 2022-05-16 — End: 2022-06-28

## 2022-05-16 MED ORDER — HYDRALAZINE HCL 25 MG PO TABS: 25.0000 mg | ORAL_TABLET | Freq: Three times a day (TID) | ORAL | 0 refills | Status: DC

## 2022-05-16 MED ORDER — ASPIRIN 81 MG PO TBEC: 81.0000 mg | DELAYED_RELEASE_TABLET | Freq: Every day | ORAL | 0 refills | Status: DC

## 2022-05-16 MED ORDER — FLUTICASONE PROPIONATE 50 MCG/ACT NA SUSP
1.0000 | Freq: Every day | NASAL | 0 refills | Status: DC
Start: 2022-05-16 — End: 2022-08-14

## 2022-05-16 MED ORDER — ATORVASTATIN CALCIUM 40 MG PO TABS
40.0000 mg | ORAL_TABLET | Freq: Every day | ORAL | 3 refills | Status: DC
Start: 2022-05-16 — End: 2022-10-24

## 2022-05-16 MED ORDER — HUMALOG MIX 75/25 (75-25) 100 UNIT/ML ~~LOC~~ SUSP: SUBCUTANEOUS | 2 refills | Status: DC

## 2022-05-16 MED ORDER — ENALAPRIL MALEATE 5 MG PO TABS: 5.0000 mg | ORAL_TABLET | Freq: Every day | ORAL | 1 refills | Status: DC

## 2022-05-16 MED ORDER — FAMOTIDINE 10 MG PO TABS: 10.0000 mg | ORAL_TABLET | Freq: Two times a day (BID) | ORAL | 1 refills | Status: DC

## 2022-05-16 MED ORDER — BD PEN NEEDLE SHORT U/F 31G X 8 MM MISC: 1.0000 "application " | Freq: Three times a day (TID) | 11 refills | Status: DC

## 2022-05-16 NOTE — Patient Instructions (Signed)
Check blood sugars fasting and bedtime. Adhere to medication regimen

## 2022-05-16 NOTE — Progress Notes (Signed)
Patient ID: Jeremy [name]., male   DOB: 06/24/1966, 56 y.o.   MRN: [id number]   Jeremy [name], is a 56 y.o. male  UTM:[id number]  FKC:[id number]  DOB - 04-Apr-1966  Chief Complaint  Patient presents with   Hypertension   Diabetes       Subjective:   [name] is a 56 y.o. male here today for med RF.  On HD for ESRD.(M,W,F).  after lengthy discussion and lengthy questioning, it becomes clear that he is taking very little, if any of his medications.  He occasionally takes humalog.  Has not been taking basal insulin at all.  Takes hydralazine occasionally but no other BP meds.  Admits to poor diet and eating a lot of sugar.  Does not check blood sugars regularly.  Denies HA/Dizziness/CP/sob.      No problems updated.  ALLERGIES: Allergies  Allergen Reactions   Amlodipine Other (See Comments)    Dizziness    PAST MEDICAL HISTORY: Past Medical History:  Diagnosis Date   Abscess    AKI (acute kidney injury) ([name]) 08/05/2017   Bell's palsy    right eye abn since dx bell's palsy   CKD (chronic kidney disease)    Dr. McDiarmind    Dyspnea    occasional with exertion   Headache    High cholesterol    Hypertension    Left shoulder pain 10/26/2013   S/p injection on 10/26/13    Renal insufficiency 02/14/2014   Seizures ([place]) 08/04/2017   "related to high blood sugars" (08/05/2017)   Type II diabetes mellitus ([name])     MEDICATIONS AT HOME: Prior to Admission medications   Medication Sig Start Date End Date Taking? Authorizing Provider  aspirin EC (ASPIRIN LOW DOSE) 81 MG tablet Take 1 tablet (81 mg total) by mouth daily. 05/16/22   Argentina Donovan, PA-C  atorvastatin (LIPITOR) 40 MG tablet Take 1 tablet (40 mg total) by mouth daily. 05/16/22 08/14/22  Argentina Donovan, PA-C  Blood Glucose Monitoring Suppl (CONTOUR NEXT MONITOR) w/Device KIT 1 Device by Does not apply route 4 (four) times daily - after meals and at bedtime. 08/23/19   Mullis, Kiersten P, DO  carvedilol  (COREG) 3.125 MG tablet Take 1 tablet (3.125 mg total) by mouth 2 (two) times daily with a meal. 05/16/22 08/14/22  Mayu Ronk, Dionne Bucy, PA-C  cinacalcet (SENSIPAR) 30 MG tablet Take by mouth. 09/26/21   [provider]  cyclobenzaprine (FLEXERIL) 10 MG tablet Take 1 tablet (10 mg total) by mouth 3 (three) times daily as needed for muscle spasms. 01/21/22   Mayers, Cari S, PA-C  enalapril (VASOTEC) 5 MG tablet Take 1 tablet (5 mg total) by mouth daily. 05/16/22   Argentina Donovan, PA-C  famotidine (PEPCID) 10 MG tablet Take 1 tablet (10 mg total) by mouth 2 (two) times daily. 05/16/22 06/15/22  Argentina Donovan, PA-C  fluticasone (FLONASE) 50 MCG/ACT nasal spray Place 1 spray into both nostrils daily. 05/16/22   Adain Geurin, Dionne Bucy, PA-C  FOSRENOL 1000 MG PACK Take 1 packet by mouth 3 (three) times daily. 10/04/21   [provider]  glucose blood (TRUE METRIX BLOOD GLUCOSE TEST) test strip Use as instructed. Check blood glucose level by fingerstick 3 times per day. 01/29/22   Gildardo Pounds, NP  hydrALAZINE (APRESOLINE) 25 MG tablet Take 1 tablet (25 mg total) by mouth 3 (three) times daily. 05/16/22   Argentina Donovan, PA-C  HYDROcodone-acetaminophen (NORCO/VICODIN) 5-325 MG tablet Take 1 tablet  by mouth every 6 (six) hours as needed for moderate pain. 05/18/21   Fredia Sorrow, MD  insulin glargine-yfgn (SEMGLEE) 100 UNIT/ML Pen Inject 50 Units into the skin daily. 05/16/22   Argentina Donovan, PA-C  insulin lispro (HUMALOG KWIKPEN) 100 UNIT/ML KwikPen Inject 6 units prior to all meals. 1 hr after meals inject according to scale. For blood sugars 0-150 give 0 units of insulin, 151-200 give 2 units of insulin, 201-250 give 4 units, 251-300 give 6 units, 301-350 give 8 units, 351-400 give 10 units,> 400 give 12 units 02/06/22   Gildardo Pounds, NP  insulin lispro protamine-lispro (HUMALOG MIX 75/25) (75-25) 100 UNIT/ML SUSP injection Inject 30 Units into the skin daily with breakfast AND 30  Units daily with supper. 05/16/22 08/14/22  Argentina Donovan, PA-C  Insulin Pen Needle (B-D ULTRAFINE III SHORT PEN) 31G X 8 MM MISC 1 application  by Does not apply route 3 (three) times daily. E11.65 05/16/22   Argentina Donovan, PA-C  Lancet Devices (MICROLET NEXT LANCING DEVICE) MISC 1 Device by Does not apply route 3 (three) times daily. 11/30/19   Gildardo Pounds, NP  Lanthanum Carbonate (FOSRENOL) 1000 MG PACK Take by mouth. 10/04/21   [provider]  levETIRAcetam (KEPPRA XR) 500 MG 24 hr tablet Take 2 tablets (1,000 mg total) by mouth daily. 02/21/22   Ward Givens, NP  Microlet Lancets MISC 1 Device by Does not apply route 3 (three) times daily. E11.65 11/30/19   Gildardo Pounds, NP  omeprazole (PRILOSEC) 20 MG capsule Take 1 capsule (20 mg total) by mouth daily. 04/03/21   Gildardo Pounds, NP  topiramate (TOPAMAX) 50 MG tablet Take by mouth. 11/21/21   [provider]  traZODone (DESYREL) 150 MG tablet Take 1 tablet (150 mg total) by mouth at bedtime. 12/05/20 09/20/21  Gildardo Pounds, NP  VELPHORO 500 MG chewable tablet Chew 500 mg by mouth 3 (three) times daily. 02/07/20   [provider]    ROS: Neg HEENT Neg resp Neg cardiac Neg GI Neg GU Neg MS Neg psych Neg neuro  Objective:   Vitals:   05/16/22 0943  BP: (!) 179/96  Pulse: 78  SpO2: 97%  Weight: (!) 300 lb 9.6 oz (136.4 kg)  Height: '5\' 8"'  (1.727 m)   Exam General appearance : Awake, alert, not in any distress. Speech Clear. Not toxic looking. obese HEENT: Atraumatic and Normocephalic.  B ears withput cerumen build up Neck: Supple, no JVD. No cervical lymphadenopathy.  Chest: Good air entry bilaterally, CTAB.  No rales/rhonchi/wheezing CVS: S1 S2 regular, no murmurs.  Extremities: B/L Lower Ext shows no edema, both legs are warm to touch Neurology: Awake alert, and oriented X 3, CN II-XII intact, Non focal Skin: No Rash  Data Review Lab Results  Component Value Date   HGBA1C 11.5  (H) 02/04/2022   HGBA1C 12.3 (A) 09/10/2021   HGBA1C 12.9 (A) 04/03/2021    Assessment & Plan   1. Type 2 diabetes mellitus with diabetic peripheral angiopathy without gangrene, with long-term current use of insulin (HCC) Uncontrolled/not compliant.  A1C=13.4 today Check blood sugars fasting and bedtime. Adhere to medication regimen - Glucose (CBG) - HgB A1c - insulin lispro protamine-lispro (HUMALOG MIX 75/25) (75-25) 100 UNIT/ML SUSP injection; Inject 30 Units into the skin daily with breakfast AND 30 Units daily with supper.  Dispense: 18 mL; Refill: 2 - atorvastatin (LIPITOR) 40 MG tablet; Take 1 tablet (40 mg total) by mouth daily.  Dispense: 90 tablet; Refill: 3 - insulin glargine-yfgn (SEMGLEE) 100 UNIT/ML Pen; Inject 50 Units into the skin daily.  Dispense: 15 mL; Refill: 3 - Insulin Pen Needle (B-D ULTRAFINE III SHORT PEN) 31G X 8 MM MISC; 1 application  by Does not apply route 3 (three) times daily. E11.65  Dispense: 200 each; Refill: 11  2. Essential hypertension Uncontrolled noncopliant - hydrALAZINE (APRESOLINE) 25 MG tablet; Take 1 tablet (25 mg total) by mouth 3 (three) times daily.  Dispense: 270 tablet; Refill: 0 - carvedilol (COREG) 3.125 MG tablet; Take 1 tablet (3.125 mg total) by mouth 2 (two) times daily with a meal.  Dispense: 180 tablet; Refill: 0 - enalapril (VASOTEC) 5 MG tablet; Take 1 tablet (5 mg total) by mouth daily.  Dispense: 90 tablet; Refill: 1 - atorvastatin (LIPITOR) 40 MG tablet; Take 1 tablet (40 mg total) by mouth daily.  Dispense: 90 tablet; Refill: 3  3. ESRD on dialysis [facility]) M,W,F.  Labs done there  4. Non compliance w medication regimen Compliance imperative  5. Other specified coagulation defects ([place]) - aspirin EC (ASPIRIN LOW DOSE) 81 MG tablet; Take 1 tablet (81 mg total) by mouth daily.  Dispense: 90 tablet; Refill: 0  6. Chronic cough - famotidine (PEPCID) 10 MG tablet; Take 1 tablet (10 mg total) by mouth 2 (two) times daily.   Dispense: 60 tablet; Refill: 1  7. Mixed hyperlipidemia due to type 2 diabetes mellitus (HCC) - atorvastatin (LIPITOR) 40 MG tablet; Take 1 tablet (40 mg total) by mouth daily.  Dispense: 90 tablet; Refill: 3  Return for 3 weeks with Luk for BP and DM;  3 months with PCP.  The patient was given clear instructions to go to ER or return to medical center if symptoms don't improve, worsen or new problems develop. The patient verbalized understanding. The patient was told to call to get lab results if they haven't heard anything in the next week.      Freeman Caldron, PA-C [facility], [place]   05/16/2022, 10:04 AM

## 2022-05-17 DIAGNOSIS — Z992 Dependence on renal dialysis: Secondary | ICD-10-CM | POA: Diagnosis not present

## 2022-05-17 DIAGNOSIS — Z23 Encounter for immunization: Secondary | ICD-10-CM | POA: Diagnosis not present

## 2022-05-17 DIAGNOSIS — N186 End stage renal disease: Secondary | ICD-10-CM | POA: Diagnosis not present

## 2022-05-17 DIAGNOSIS — D509 Iron deficiency anemia, unspecified: Secondary | ICD-10-CM | POA: Diagnosis not present

## 2022-05-17 DIAGNOSIS — N2581 Secondary hyperparathyroidism of renal origin: Secondary | ICD-10-CM | POA: Diagnosis not present

## 2022-05-17 DIAGNOSIS — E1129 Type 2 diabetes mellitus with other diabetic kidney complication: Secondary | ICD-10-CM | POA: Diagnosis not present

## 2022-05-20 DIAGNOSIS — Z992 Dependence on renal dialysis: Secondary | ICD-10-CM | POA: Diagnosis not present

## 2022-05-20 DIAGNOSIS — Z23 Encounter for immunization: Secondary | ICD-10-CM | POA: Diagnosis not present

## 2022-05-20 DIAGNOSIS — E1129 Type 2 diabetes mellitus with other diabetic kidney complication: Secondary | ICD-10-CM | POA: Diagnosis not present

## 2022-05-20 DIAGNOSIS — N2581 Secondary hyperparathyroidism of renal origin: Secondary | ICD-10-CM | POA: Diagnosis not present

## 2022-05-20 DIAGNOSIS — D509 Iron deficiency anemia, unspecified: Secondary | ICD-10-CM | POA: Diagnosis not present

## 2022-05-20 DIAGNOSIS — N186 End stage renal disease: Secondary | ICD-10-CM | POA: Diagnosis not present

## 2022-05-21 DIAGNOSIS — I871 Compression of vein: Secondary | ICD-10-CM | POA: Diagnosis not present

## 2022-05-22 DIAGNOSIS — D509 Iron deficiency anemia, unspecified: Secondary | ICD-10-CM | POA: Diagnosis not present

## 2022-05-22 DIAGNOSIS — E1129 Type 2 diabetes mellitus with other diabetic kidney complication: Secondary | ICD-10-CM | POA: Diagnosis not present

## 2022-05-22 DIAGNOSIS — Z992 Dependence on renal dialysis: Secondary | ICD-10-CM | POA: Diagnosis not present

## 2022-05-22 DIAGNOSIS — N186 End stage renal disease: Secondary | ICD-10-CM | POA: Diagnosis not present

## 2022-05-22 DIAGNOSIS — N2581 Secondary hyperparathyroidism of renal origin: Secondary | ICD-10-CM | POA: Diagnosis not present

## 2022-05-24 DIAGNOSIS — N2581 Secondary hyperparathyroidism of renal origin: Secondary | ICD-10-CM | POA: Diagnosis not present

## 2022-05-24 DIAGNOSIS — D509 Iron deficiency anemia, unspecified: Secondary | ICD-10-CM | POA: Diagnosis not present

## 2022-05-24 DIAGNOSIS — N186 End stage renal disease: Secondary | ICD-10-CM | POA: Diagnosis not present

## 2022-05-24 DIAGNOSIS — E1129 Type 2 diabetes mellitus with other diabetic kidney complication: Secondary | ICD-10-CM | POA: Diagnosis not present

## 2022-05-24 DIAGNOSIS — Z992 Dependence on renal dialysis: Secondary | ICD-10-CM | POA: Diagnosis not present

## 2022-05-27 DIAGNOSIS — N186 End stage renal disease: Secondary | ICD-10-CM | POA: Diagnosis not present

## 2022-05-27 DIAGNOSIS — N2581 Secondary hyperparathyroidism of renal origin: Secondary | ICD-10-CM | POA: Diagnosis not present

## 2022-05-27 DIAGNOSIS — Z992 Dependence on renal dialysis: Secondary | ICD-10-CM | POA: Diagnosis not present

## 2022-05-27 DIAGNOSIS — D509 Iron deficiency anemia, unspecified: Secondary | ICD-10-CM | POA: Diagnosis not present

## 2022-05-27 DIAGNOSIS — E1129 Type 2 diabetes mellitus with other diabetic kidney complication: Secondary | ICD-10-CM | POA: Diagnosis not present

## 2022-05-29 DIAGNOSIS — N2581 Secondary hyperparathyroidism of renal origin: Secondary | ICD-10-CM | POA: Diagnosis not present

## 2022-05-29 DIAGNOSIS — N186 End stage renal disease: Secondary | ICD-10-CM | POA: Diagnosis not present

## 2022-05-29 DIAGNOSIS — E1129 Type 2 diabetes mellitus with other diabetic kidney complication: Secondary | ICD-10-CM | POA: Diagnosis not present

## 2022-05-29 DIAGNOSIS — Z992 Dependence on renal dialysis: Secondary | ICD-10-CM | POA: Diagnosis not present

## 2022-05-29 DIAGNOSIS — D509 Iron deficiency anemia, unspecified: Secondary | ICD-10-CM | POA: Diagnosis not present

## 2022-05-31 DIAGNOSIS — D509 Iron deficiency anemia, unspecified: Secondary | ICD-10-CM | POA: Diagnosis not present

## 2022-05-31 DIAGNOSIS — N2581 Secondary hyperparathyroidism of renal origin: Secondary | ICD-10-CM | POA: Diagnosis not present

## 2022-05-31 DIAGNOSIS — Z992 Dependence on renal dialysis: Secondary | ICD-10-CM | POA: Diagnosis not present

## 2022-05-31 DIAGNOSIS — N186 End stage renal disease: Secondary | ICD-10-CM | POA: Diagnosis not present

## 2022-05-31 DIAGNOSIS — E1129 Type 2 diabetes mellitus with other diabetic kidney complication: Secondary | ICD-10-CM | POA: Diagnosis not present

## 2022-06-03 DIAGNOSIS — N2581 Secondary hyperparathyroidism of renal origin: Secondary | ICD-10-CM | POA: Diagnosis not present

## 2022-06-03 DIAGNOSIS — E1129 Type 2 diabetes mellitus with other diabetic kidney complication: Secondary | ICD-10-CM | POA: Diagnosis not present

## 2022-06-03 DIAGNOSIS — Z992 Dependence on renal dialysis: Secondary | ICD-10-CM | POA: Diagnosis not present

## 2022-06-03 DIAGNOSIS — D509 Iron deficiency anemia, unspecified: Secondary | ICD-10-CM | POA: Diagnosis not present

## 2022-06-03 DIAGNOSIS — N186 End stage renal disease: Secondary | ICD-10-CM | POA: Diagnosis not present

## 2022-06-05 DIAGNOSIS — D509 Iron deficiency anemia, unspecified: Secondary | ICD-10-CM | POA: Diagnosis not present

## 2022-06-05 DIAGNOSIS — Z992 Dependence on renal dialysis: Secondary | ICD-10-CM | POA: Diagnosis not present

## 2022-06-05 DIAGNOSIS — N2581 Secondary hyperparathyroidism of renal origin: Secondary | ICD-10-CM | POA: Diagnosis not present

## 2022-06-05 DIAGNOSIS — E1129 Type 2 diabetes mellitus with other diabetic kidney complication: Secondary | ICD-10-CM | POA: Diagnosis not present

## 2022-06-05 DIAGNOSIS — N186 End stage renal disease: Secondary | ICD-10-CM | POA: Diagnosis not present

## 2022-06-07 DIAGNOSIS — D509 Iron deficiency anemia, unspecified: Secondary | ICD-10-CM | POA: Diagnosis not present

## 2022-06-07 DIAGNOSIS — N186 End stage renal disease: Secondary | ICD-10-CM | POA: Diagnosis not present

## 2022-06-07 DIAGNOSIS — Z992 Dependence on renal dialysis: Secondary | ICD-10-CM | POA: Diagnosis not present

## 2022-06-07 DIAGNOSIS — E1129 Type 2 diabetes mellitus with other diabetic kidney complication: Secondary | ICD-10-CM | POA: Diagnosis not present

## 2022-06-07 DIAGNOSIS — N2581 Secondary hyperparathyroidism of renal origin: Secondary | ICD-10-CM | POA: Diagnosis not present

## 2022-06-09 DIAGNOSIS — E1129 Type 2 diabetes mellitus with other diabetic kidney complication: Secondary | ICD-10-CM | POA: Diagnosis not present

## 2022-06-09 DIAGNOSIS — Z992 Dependence on renal dialysis: Secondary | ICD-10-CM | POA: Diagnosis not present

## 2022-06-09 DIAGNOSIS — N186 End stage renal disease: Secondary | ICD-10-CM | POA: Diagnosis not present

## 2022-06-09 DIAGNOSIS — D509 Iron deficiency anemia, unspecified: Secondary | ICD-10-CM | POA: Diagnosis not present

## 2022-06-09 DIAGNOSIS — N2581 Secondary hyperparathyroidism of renal origin: Secondary | ICD-10-CM | POA: Diagnosis not present

## 2022-06-11 DIAGNOSIS — D509 Iron deficiency anemia, unspecified: Secondary | ICD-10-CM | POA: Diagnosis not present

## 2022-06-11 DIAGNOSIS — N2581 Secondary hyperparathyroidism of renal origin: Secondary | ICD-10-CM | POA: Diagnosis not present

## 2022-06-11 DIAGNOSIS — N186 End stage renal disease: Secondary | ICD-10-CM | POA: Diagnosis not present

## 2022-06-11 DIAGNOSIS — Z992 Dependence on renal dialysis: Secondary | ICD-10-CM | POA: Diagnosis not present

## 2022-06-11 DIAGNOSIS — E1129 Type 2 diabetes mellitus with other diabetic kidney complication: Secondary | ICD-10-CM | POA: Diagnosis not present

## 2022-06-14 DIAGNOSIS — D509 Iron deficiency anemia, unspecified: Secondary | ICD-10-CM | POA: Diagnosis not present

## 2022-06-14 DIAGNOSIS — Z992 Dependence on renal dialysis: Secondary | ICD-10-CM | POA: Diagnosis not present

## 2022-06-14 DIAGNOSIS — N2581 Secondary hyperparathyroidism of renal origin: Secondary | ICD-10-CM | POA: Diagnosis not present

## 2022-06-14 DIAGNOSIS — N186 End stage renal disease: Secondary | ICD-10-CM | POA: Diagnosis not present

## 2022-06-14 DIAGNOSIS — E1129 Type 2 diabetes mellitus with other diabetic kidney complication: Secondary | ICD-10-CM | POA: Diagnosis not present

## 2022-06-18 DIAGNOSIS — N186 End stage renal disease: Secondary | ICD-10-CM | POA: Diagnosis not present

## 2022-06-18 DIAGNOSIS — N2581 Secondary hyperparathyroidism of renal origin: Secondary | ICD-10-CM | POA: Diagnosis not present

## 2022-06-18 DIAGNOSIS — D509 Iron deficiency anemia, unspecified: Secondary | ICD-10-CM | POA: Diagnosis not present

## 2022-06-18 DIAGNOSIS — Z992 Dependence on renal dialysis: Secondary | ICD-10-CM | POA: Diagnosis not present

## 2022-06-18 DIAGNOSIS — E1129 Type 2 diabetes mellitus with other diabetic kidney complication: Secondary | ICD-10-CM | POA: Diagnosis not present

## 2022-06-19 DIAGNOSIS — N2581 Secondary hyperparathyroidism of renal origin: Secondary | ICD-10-CM | POA: Diagnosis not present

## 2022-06-19 DIAGNOSIS — E1129 Type 2 diabetes mellitus with other diabetic kidney complication: Secondary | ICD-10-CM | POA: Diagnosis not present

## 2022-06-19 DIAGNOSIS — N186 End stage renal disease: Secondary | ICD-10-CM | POA: Diagnosis not present

## 2022-06-19 DIAGNOSIS — Z992 Dependence on renal dialysis: Secondary | ICD-10-CM | POA: Diagnosis not present

## 2022-06-19 DIAGNOSIS — D509 Iron deficiency anemia, unspecified: Secondary | ICD-10-CM | POA: Diagnosis not present

## 2022-06-20 DIAGNOSIS — Z992 Dependence on renal dialysis: Secondary | ICD-10-CM | POA: Diagnosis not present

## 2022-06-20 DIAGNOSIS — N186 End stage renal disease: Secondary | ICD-10-CM | POA: Diagnosis not present

## 2022-06-20 DIAGNOSIS — I129 Hypertensive chronic kidney disease with stage 1 through stage 4 chronic kidney disease, or unspecified chronic kidney disease: Secondary | ICD-10-CM | POA: Diagnosis not present

## 2022-06-21 DIAGNOSIS — N186 End stage renal disease: Secondary | ICD-10-CM | POA: Diagnosis not present

## 2022-06-21 DIAGNOSIS — N2581 Secondary hyperparathyroidism of renal origin: Secondary | ICD-10-CM | POA: Diagnosis not present

## 2022-06-21 DIAGNOSIS — D509 Iron deficiency anemia, unspecified: Secondary | ICD-10-CM | POA: Diagnosis not present

## 2022-06-21 DIAGNOSIS — R52 Pain, unspecified: Secondary | ICD-10-CM | POA: Diagnosis not present

## 2022-06-21 DIAGNOSIS — Z992 Dependence on renal dialysis: Secondary | ICD-10-CM | POA: Diagnosis not present

## 2022-06-21 DIAGNOSIS — E1129 Type 2 diabetes mellitus with other diabetic kidney complication: Secondary | ICD-10-CM | POA: Diagnosis not present

## 2022-06-24 DIAGNOSIS — R52 Pain, unspecified: Secondary | ICD-10-CM | POA: Diagnosis not present

## 2022-06-24 DIAGNOSIS — D509 Iron deficiency anemia, unspecified: Secondary | ICD-10-CM | POA: Diagnosis not present

## 2022-06-24 DIAGNOSIS — Z992 Dependence on renal dialysis: Secondary | ICD-10-CM | POA: Diagnosis not present

## 2022-06-24 DIAGNOSIS — E1129 Type 2 diabetes mellitus with other diabetic kidney complication: Secondary | ICD-10-CM | POA: Diagnosis not present

## 2022-06-24 DIAGNOSIS — N186 End stage renal disease: Secondary | ICD-10-CM | POA: Diagnosis not present

## 2022-06-24 DIAGNOSIS — N2581 Secondary hyperparathyroidism of renal origin: Secondary | ICD-10-CM | POA: Diagnosis not present

## 2022-06-25 ENCOUNTER — Ambulatory Visit: Payer: Medicare Other | Attending: Nurse Practitioner | Admitting: Pharmacist

## 2022-06-25 VITALS — BP 121/75 | HR 77

## 2022-06-25 DIAGNOSIS — I10 Essential (primary) hypertension: Secondary | ICD-10-CM | POA: Diagnosis not present

## 2022-06-25 DIAGNOSIS — Z794 Long term (current) use of insulin: Secondary | ICD-10-CM | POA: Diagnosis not present

## 2022-06-25 DIAGNOSIS — E1151 Type 2 diabetes mellitus with diabetic peripheral angiopathy without gangrene: Secondary | ICD-10-CM

## 2022-06-25 MED ORDER — AMLODIPINE BESYLATE 5 MG PO TABS: 5.0000 mg | ORAL_TABLET | Freq: Every day | ORAL | 1 refills | Status: DC

## 2022-06-25 NOTE — Patient Instructions (Signed)
Thank you for coming to see me today. Please do the following:  Start Semglee. Take 30units once a day.  Stop hydralazine.  Start amlodipine '5mg'$ . Take 1 tablet daily.  Continue taking carvedilol twice a day.  Continue taking enalapril once a day. Follow-up with me in 1 month.

## 2022-06-25 NOTE — Progress Notes (Signed)
S:     No chief complaint on file.  56 y.o. male who presents for diabetes evaluation, education, and management.  PMH is significant for T2DM w/ ESRD (MWF HD), anemia of chronic disease, resistant HTN, OSA, HLD, obesity, epilepsy.  Patient was referred and last seen by Freeman Caldron on 05/16/2022. A1c at that visit  13.4% (up from 11.5% previously). BP was 179/96 mmHg. He was found to be non-adherent with medications.   Today, patient arrives in good spirits and presents without any assistance.  Mr. Amer is a pleasant 56 YO male. He has a complex medical history. He admits today that cost remains as his biggest barrier regarding medication adherence. Patient reports Diabetes is longstanding. It was dx ~15-20 years ago. He initially used PO medications but stopped these shortly after starting. He went without care for some time and then was started on insulin ~1 year after dx. His DM is complicated primarily by ESRD. No charted hx of pancreatitis or thyroid cancer. He has no known hx of CHF, stroke, ACS, or CAD. His ASCVD 10-yr risk is quite high.  Of note, he was followed in 2021 by CPP colleagues of mine at [facility] here at [facility]. At one point, that team had him on Ozempic and Humalog alone for DM. His A1c was closer to goal then (9.0%) but he still had problems with medication adherence contributing to suboptimal control. In regards to his BP, he was on enalapril, amlodipine, and TID hydralazine with better BP control.   He established care with our clinic thereafter on 10/26/2019, and his A1c at that visit was 8.1%. Since then, it has been consistently above goal, ranging from 11.4 - 13.4% currently. He had a significant gap in office visits between 2021-now. He still has issues with medication non-adherence, and I have summarized those below.  Family/Social History:  -Fhx: DM, heart disease, seizure disorder, stroke -Tobacco: never smoker  -Alcohol: none reported   Current diabetes  medications include: Semglee 50u daily (not taking), Humalog SSI, Humalog mix 75-25 30u BID (not taking). Current hypertension medications include: carvedilol 3.125 mg BID, enalapril 5 mg daily, hydralazine 25 mg TID (unclear if taking consistently) Current hyperlipidemia medications include: atorvastatin 40 mg daily   Insurance coverage: BCBS - pt has high copays. He has not picked up the Semglee d/t ~$70 monthly copay.   Patient denies hypoglycemic events.  Reported home fasting blood sugars: not checking consistently. Gives range of 90-200s. He will oftentimes omit insulin if "his blood sugar is good".  Patient denies nocturia (nighttime urination).  Patient reports neuropathy (nerve pain). Patient denies visual changes. Patient reports self foot exams.   Patient reported dietary habits:  -Non-adherent to sodium restriction. "Still in that salt game" -Drinks regular Pepsi and [place]. Dew daily.  -Does not limit carbohydrates but tries to avoid sweets.  Patient-reported exercise habits: none   O:   ROS  Physical Exam  7 day average blood glucose: no meter with him. No CGM in place.  Lab Results  Component Value Date   HGBA1C 13.4 (A) 05/16/2022   Vitals:   06/25/22 1131  BP: 121/75  Pulse: 77    Lipid Panel     Component Value Date/Time   CHOL 261 (H) 02/04/2022 1129   TRIG 222 (H) 02/04/2022 1129   HDL 54 02/04/2022 1129   CHOLHDL 4.8 02/04/2022 1129   CHOLHDL 3.1 02/09/2016 1045   VLDL 11 02/09/2016 1045   LDLCALC 166 (H) 02/04/2022 1129  Clinical Atherosclerotic Cardiovascular Disease (ASCVD): No  The 10-year ASCVD risk score (Arnett DK, et al., 2019) is: 19%   Values used to calculate the score:     Age: 63 years     Sex: Male     Is Non-Hispanic African American: Yes     Diabetic: Yes     Tobacco smoker: No     Systolic Blood Pressure: 384 mmHg     Is BP treated: Yes     HDL Cholesterol: 54 mg/dL     Total Cholesterol: 261 mg/dL   A/P: Diabetes  longstanding currently uncontrolled. Patient is able to verbalize appropriate hypoglycemia management plan. Medication adherence appears suboptimal.   There are a lot of changes I wish to make for him to simplify things. The problem is medication cost and adherence in general. We will need to make small changes over time. I gave him the option of filling with our pharmacy, but he is in the process of changing insurance and prefers to fill with [facility]. I think we need a basal insulin on board for starters. After a long discussion, we agreed that he would be more adherent to a basal insulin. He has been on Ozempic in the past, but I am worried about adherence because of cost. I will take off the mixed Humalog. He isn't taking this. He's taking regular Humalog inconsistently. I have advised him to start Semglee 30u daily and he will call me with home CBG data once he has been on this for several days. We can assess for bolus insulin dosing at that time.   -Discontinued 75/25 insulin. -Hold Humalog SSI for now. -Start Semglee 30u daily.  -Patient educated on purpose, proper use, and potential adverse effects of Semglee.  -Extensively discussed pathophysiology of diabetes, recommended lifestyle interventions, dietary effects on blood sugar control.  -Counseled on s/sx of and management of hypoglycemia.  -Next A1c anticipated 07/2022.   ASCVD risk - primary prevention in patient with diabetes. Last LDL is not at goal of <70 mg/dL. high intensity statin indicated.  -Continued atorvastatin 40 mg. Encouraged adherence.   Hypertension longstanding currently at goal. Blood pressure goal of <130/80 mmHg. Medication adherence is suboptimal. He took enalapril, AM dose of carvedilol, and AM dose of hydralazine today but misses these consistently at home. He developed dizziness with amlodipine 10 mg daily. I recommend to restart this at '5mg'$  daily and stop hydralazine for now d/t concern for non-adherence.  -Stop  hydralazine. -Start amlodipine 5 mg daily.  -Continue carvedilol 3.125 mg BID. -Continue enalapril 5 mg daily.   Written patient instructions provided. Patient verbalized understanding of treatment plan.  Total time in face to face counseling 45 minutes.    Follow-up:  Pharmacist in 1 month. PCP clinic visit at the end of Jan.   Benard Halsted, PharmD, [place], [place] [phone]

## 2022-06-27 DIAGNOSIS — N186 End stage renal disease: Secondary | ICD-10-CM | POA: Diagnosis not present

## 2022-06-27 DIAGNOSIS — E1129 Type 2 diabetes mellitus with other diabetic kidney complication: Secondary | ICD-10-CM | POA: Diagnosis not present

## 2022-06-27 DIAGNOSIS — N2581 Secondary hyperparathyroidism of renal origin: Secondary | ICD-10-CM | POA: Diagnosis not present

## 2022-06-27 DIAGNOSIS — Z992 Dependence on renal dialysis: Secondary | ICD-10-CM | POA: Diagnosis not present

## 2022-06-27 DIAGNOSIS — D509 Iron deficiency anemia, unspecified: Secondary | ICD-10-CM | POA: Diagnosis not present

## 2022-06-27 DIAGNOSIS — R52 Pain, unspecified: Secondary | ICD-10-CM | POA: Diagnosis not present

## 2022-06-28 ENCOUNTER — Other Ambulatory Visit: Payer: Self-pay | Admitting: Pharmacist

## 2022-06-28 ENCOUNTER — Other Ambulatory Visit: Payer: Self-pay

## 2022-06-28 DIAGNOSIS — E1129 Type 2 diabetes mellitus with other diabetic kidney complication: Secondary | ICD-10-CM | POA: Diagnosis not present

## 2022-06-28 DIAGNOSIS — R52 Pain, unspecified: Secondary | ICD-10-CM | POA: Diagnosis not present

## 2022-06-28 DIAGNOSIS — D509 Iron deficiency anemia, unspecified: Secondary | ICD-10-CM | POA: Diagnosis not present

## 2022-06-28 DIAGNOSIS — E1151 Type 2 diabetes mellitus with diabetic peripheral angiopathy without gangrene: Secondary | ICD-10-CM

## 2022-06-28 DIAGNOSIS — N2581 Secondary hyperparathyroidism of renal origin: Secondary | ICD-10-CM | POA: Diagnosis not present

## 2022-06-28 DIAGNOSIS — Z992 Dependence on renal dialysis: Secondary | ICD-10-CM | POA: Diagnosis not present

## 2022-06-28 DIAGNOSIS — N186 End stage renal disease: Secondary | ICD-10-CM | POA: Diagnosis not present

## 2022-06-28 MED ORDER — INSULIN GLARGINE-YFGN 100 UNIT/ML ~~LOC~~ SOPN
30.0000 [IU] | PEN_INJECTOR | Freq: Every day | SUBCUTANEOUS | 1 refills | Status: DC
  Filled 2022-06-28: qty 15, 50d supply, fill #0

## 2022-07-01 ENCOUNTER — Other Ambulatory Visit: Payer: Self-pay

## 2022-07-01 DIAGNOSIS — Z992 Dependence on renal dialysis: Secondary | ICD-10-CM | POA: Diagnosis not present

## 2022-07-01 DIAGNOSIS — D509 Iron deficiency anemia, unspecified: Secondary | ICD-10-CM | POA: Diagnosis not present

## 2022-07-01 DIAGNOSIS — E1129 Type 2 diabetes mellitus with other diabetic kidney complication: Secondary | ICD-10-CM | POA: Diagnosis not present

## 2022-07-01 DIAGNOSIS — N186 End stage renal disease: Secondary | ICD-10-CM | POA: Diagnosis not present

## 2022-07-01 DIAGNOSIS — N2581 Secondary hyperparathyroidism of renal origin: Secondary | ICD-10-CM | POA: Diagnosis not present

## 2022-07-01 DIAGNOSIS — R52 Pain, unspecified: Secondary | ICD-10-CM | POA: Diagnosis not present

## 2022-07-02 DIAGNOSIS — T82898A Other specified complication of vascular prosthetic devices, implants and grafts, initial encounter: Secondary | ICD-10-CM | POA: Diagnosis not present

## 2022-07-02 DIAGNOSIS — Z992 Dependence on renal dialysis: Secondary | ICD-10-CM | POA: Diagnosis not present

## 2022-07-02 DIAGNOSIS — N186 End stage renal disease: Secondary | ICD-10-CM | POA: Diagnosis not present

## 2022-07-02 DIAGNOSIS — I871 Compression of vein: Secondary | ICD-10-CM | POA: Diagnosis not present

## 2022-07-03 DIAGNOSIS — Z992 Dependence on renal dialysis: Secondary | ICD-10-CM | POA: Diagnosis not present

## 2022-07-03 DIAGNOSIS — N2581 Secondary hyperparathyroidism of renal origin: Secondary | ICD-10-CM | POA: Diagnosis not present

## 2022-07-03 DIAGNOSIS — D509 Iron deficiency anemia, unspecified: Secondary | ICD-10-CM | POA: Diagnosis not present

## 2022-07-03 DIAGNOSIS — N186 End stage renal disease: Secondary | ICD-10-CM | POA: Diagnosis not present

## 2022-07-03 DIAGNOSIS — R52 Pain, unspecified: Secondary | ICD-10-CM | POA: Diagnosis not present

## 2022-07-03 DIAGNOSIS — E1129 Type 2 diabetes mellitus with other diabetic kidney complication: Secondary | ICD-10-CM | POA: Diagnosis not present

## 2022-07-05 DIAGNOSIS — N186 End stage renal disease: Secondary | ICD-10-CM | POA: Diagnosis not present

## 2022-07-05 DIAGNOSIS — Z992 Dependence on renal dialysis: Secondary | ICD-10-CM | POA: Diagnosis not present

## 2022-07-05 DIAGNOSIS — N2581 Secondary hyperparathyroidism of renal origin: Secondary | ICD-10-CM | POA: Diagnosis not present

## 2022-07-05 DIAGNOSIS — R52 Pain, unspecified: Secondary | ICD-10-CM | POA: Diagnosis not present

## 2022-07-05 DIAGNOSIS — E1129 Type 2 diabetes mellitus with other diabetic kidney complication: Secondary | ICD-10-CM | POA: Diagnosis not present

## 2022-07-05 DIAGNOSIS — D509 Iron deficiency anemia, unspecified: Secondary | ICD-10-CM | POA: Diagnosis not present

## 2022-07-08 DIAGNOSIS — D509 Iron deficiency anemia, unspecified: Secondary | ICD-10-CM | POA: Diagnosis not present

## 2022-07-08 DIAGNOSIS — N186 End stage renal disease: Secondary | ICD-10-CM | POA: Diagnosis not present

## 2022-07-08 DIAGNOSIS — N2581 Secondary hyperparathyroidism of renal origin: Secondary | ICD-10-CM | POA: Diagnosis not present

## 2022-07-08 DIAGNOSIS — R52 Pain, unspecified: Secondary | ICD-10-CM | POA: Diagnosis not present

## 2022-07-08 DIAGNOSIS — Z992 Dependence on renal dialysis: Secondary | ICD-10-CM | POA: Diagnosis not present

## 2022-07-08 DIAGNOSIS — E1129 Type 2 diabetes mellitus with other diabetic kidney complication: Secondary | ICD-10-CM | POA: Diagnosis not present

## 2022-07-10 DIAGNOSIS — D509 Iron deficiency anemia, unspecified: Secondary | ICD-10-CM | POA: Diagnosis not present

## 2022-07-10 DIAGNOSIS — N186 End stage renal disease: Secondary | ICD-10-CM | POA: Diagnosis not present

## 2022-07-10 DIAGNOSIS — R52 Pain, unspecified: Secondary | ICD-10-CM | POA: Diagnosis not present

## 2022-07-10 DIAGNOSIS — Z992 Dependence on renal dialysis: Secondary | ICD-10-CM | POA: Diagnosis not present

## 2022-07-10 DIAGNOSIS — E1129 Type 2 diabetes mellitus with other diabetic kidney complication: Secondary | ICD-10-CM | POA: Diagnosis not present

## 2022-07-10 DIAGNOSIS — N2581 Secondary hyperparathyroidism of renal origin: Secondary | ICD-10-CM | POA: Diagnosis not present

## 2022-07-12 DIAGNOSIS — D509 Iron deficiency anemia, unspecified: Secondary | ICD-10-CM | POA: Diagnosis not present

## 2022-07-12 DIAGNOSIS — N2581 Secondary hyperparathyroidism of renal origin: Secondary | ICD-10-CM | POA: Diagnosis not present

## 2022-07-12 DIAGNOSIS — Z992 Dependence on renal dialysis: Secondary | ICD-10-CM | POA: Diagnosis not present

## 2022-07-12 DIAGNOSIS — E1129 Type 2 diabetes mellitus with other diabetic kidney complication: Secondary | ICD-10-CM | POA: Diagnosis not present

## 2022-07-12 DIAGNOSIS — R52 Pain, unspecified: Secondary | ICD-10-CM | POA: Diagnosis not present

## 2022-07-12 DIAGNOSIS — N186 End stage renal disease: Secondary | ICD-10-CM | POA: Diagnosis not present

## 2022-07-14 DIAGNOSIS — E1129 Type 2 diabetes mellitus with other diabetic kidney complication: Secondary | ICD-10-CM | POA: Diagnosis not present

## 2022-07-14 DIAGNOSIS — D509 Iron deficiency anemia, unspecified: Secondary | ICD-10-CM | POA: Diagnosis not present

## 2022-07-14 DIAGNOSIS — R52 Pain, unspecified: Secondary | ICD-10-CM | POA: Diagnosis not present

## 2022-07-14 DIAGNOSIS — N2581 Secondary hyperparathyroidism of renal origin: Secondary | ICD-10-CM | POA: Diagnosis not present

## 2022-07-14 DIAGNOSIS — N186 End stage renal disease: Secondary | ICD-10-CM | POA: Diagnosis not present

## 2022-07-14 DIAGNOSIS — Z992 Dependence on renal dialysis: Secondary | ICD-10-CM | POA: Diagnosis not present

## 2022-07-17 DIAGNOSIS — E1129 Type 2 diabetes mellitus with other diabetic kidney complication: Secondary | ICD-10-CM | POA: Diagnosis not present

## 2022-07-17 DIAGNOSIS — N186 End stage renal disease: Secondary | ICD-10-CM | POA: Diagnosis not present

## 2022-07-17 DIAGNOSIS — N2581 Secondary hyperparathyroidism of renal origin: Secondary | ICD-10-CM | POA: Diagnosis not present

## 2022-07-17 DIAGNOSIS — D509 Iron deficiency anemia, unspecified: Secondary | ICD-10-CM | POA: Diagnosis not present

## 2022-07-17 DIAGNOSIS — R52 Pain, unspecified: Secondary | ICD-10-CM | POA: Diagnosis not present

## 2022-07-17 DIAGNOSIS — Z992 Dependence on renal dialysis: Secondary | ICD-10-CM | POA: Diagnosis not present

## 2022-07-19 DIAGNOSIS — E1129 Type 2 diabetes mellitus with other diabetic kidney complication: Secondary | ICD-10-CM | POA: Diagnosis not present

## 2022-07-19 DIAGNOSIS — R52 Pain, unspecified: Secondary | ICD-10-CM | POA: Diagnosis not present

## 2022-07-19 DIAGNOSIS — N186 End stage renal disease: Secondary | ICD-10-CM | POA: Diagnosis not present

## 2022-07-19 DIAGNOSIS — N2581 Secondary hyperparathyroidism of renal origin: Secondary | ICD-10-CM | POA: Diagnosis not present

## 2022-07-19 DIAGNOSIS — D509 Iron deficiency anemia, unspecified: Secondary | ICD-10-CM | POA: Diagnosis not present

## 2022-07-19 DIAGNOSIS — Z992 Dependence on renal dialysis: Secondary | ICD-10-CM | POA: Diagnosis not present

## 2022-07-21 DIAGNOSIS — Z992 Dependence on renal dialysis: Secondary | ICD-10-CM | POA: Diagnosis not present

## 2022-07-21 DIAGNOSIS — N186 End stage renal disease: Secondary | ICD-10-CM | POA: Diagnosis not present

## 2022-07-21 DIAGNOSIS — I129 Hypertensive chronic kidney disease with stage 1 through stage 4 chronic kidney disease, or unspecified chronic kidney disease: Secondary | ICD-10-CM | POA: Diagnosis not present

## 2022-07-23 ENCOUNTER — Encounter: Payer: Self-pay | Admitting: Vascular Surgery

## 2022-07-23 ENCOUNTER — Other Ambulatory Visit: Payer: Self-pay

## 2022-07-23 ENCOUNTER — Ambulatory Visit (INDEPENDENT_AMBULATORY_CARE_PROVIDER_SITE_OTHER): Payer: Medicare Other | Admitting: Vascular Surgery

## 2022-07-23 VITALS — BP 182/101 | HR 71 | Temp 97.6°F | Ht 68.0 in | Wt 310.0 lb

## 2022-07-23 DIAGNOSIS — Z992 Dependence on renal dialysis: Secondary | ICD-10-CM

## 2022-07-23 DIAGNOSIS — N186 End stage renal disease: Secondary | ICD-10-CM | POA: Diagnosis not present

## 2022-07-23 MED ORDER — SODIUM CHLORIDE 0.9% FLUSH: 3.0000 mL | Freq: Two times a day (BID) | INTRAVENOUS | Status: AC

## 2022-07-23 MED ORDER — SODIUM CHLORIDE 0.9 % IV SOLN: 250.0000 mL | INTRAVENOUS | Status: AC | PRN

## 2022-07-23 NOTE — Progress Notes (Signed)
Patient name: Jeremy [name]. MRN: [id number] DOB: [birth year] Sex: male  REASON FOR CONSULT: Evaluate lesions over left arm AV fistula  HPI: Jeremy [name]. is a 57 y.o. male, with end-stage renal disease on hemodialysis Monday Wednesday Friday that presents for evaluation of lesions over left arm AV fistula.  Patient previously had a left radiocephalic AV fistula on 10/28/8117 that had to be subsequently revised with sidebranch ligation.  He is referred by Dr. Reeves Dam with [facility].  He states he has had no bleeding events.  States they are having issues using the fistula and having to reposition the needle multiple times with the machine alarming.  Past Medical History:  Diagnosis Date   Abscess    AKI (acute kidney injury) ([place]) 08/05/2017   Bell's palsy    right eye abn since dx bell's palsy   CKD (chronic kidney disease)    Dr. McDiarmind    Dyspnea    occasional with exertion   Headache    High cholesterol    Hypertension    Left shoulder pain 10/26/2013   S/p injection on 10/26/13    Renal insufficiency 02/14/2014   Seizures ([place]) 08/04/2017   "related to high blood sugars" (08/05/2017)   Type II diabetes mellitus ([place])     Past Surgical History:  Procedure Laterality Date   AV FISTULA PLACEMENT Left 11/11/2019   Procedure: LEFT FOREARM RADIOCEPHALIC ARTERIOVENOUS (AV) FISTULA CREATION;  Surgeon: [name], MD;  Location: [facility];  Service: Vascular;  Laterality: Left;   Eyelid surgery Right    I & D EXTREMITY  11/11/2011   Procedure: IRRIGATION AND DEBRIDEMENT EXTREMITY;  Surgeon: Merrie Roof, MD;  Location: [facility];  Service: General;  Laterality: Right;  I&D Right Thigh Abscess   IR FLUORO GUIDE CV LINE RIGHT  08/12/2019   IR US GUIDE VASC ACCESS RIGHT  08/12/2019   REVISON OF ARTERIOVENOUS FISTULA Left 03/23/2020   Procedure: LEFT ARM ARTERIOVENOUS FISTULA REVISON, LIGATION OF SIDE BRANCHES AND ELEVATION;  Surgeon: [name], MD;  Location: [facility];   Service: Vascular;  Laterality: Left;    Family History  Problem Relation Age of Onset   Diabetes Mother    Heart disease Mother 63   Diabetes Sister    Diabetes Brother    Diabetes Brother    Cancer Maternal Uncle        type unknown   Seizures Niece    Stroke Neg Hx     SOCIAL HISTORY: Social History   Socioeconomic History   Marital status: Divorced    Spouse name: Not on file   Number of children: 3   Years of education: Not on file   Highest education level: Not on file  Occupational History   Occupation: maintenance tech   Occupation: [employer]  Tobacco Use   Smoking status: Never   Smokeless tobacco: Never  Vaping Use   Vaping Use: Never used  Substance and Sexual Activity   Alcohol use: Not Currently   Drug use: No   Sexual activity: Yes  Other Topics Concern   Not on file  Social History Narrative   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  Did not graduate high school.  Has 3 adult children in [place].  Divorced.  Lives with girlfriend [name].                   Social Determinants of Health   Financial Resource Strain:  Not on file  Food Insecurity: Not on file  Transportation Needs: Not on file  Physical Activity: Unknown (08/31/2019)   Exercise Vital Sign    Days of Exercise per Week: 2 days    Minutes of Exercise per Session: Not on file  Stress: Not on file  Social Connections: Not on file  Intimate Partner Violence: Not on file    Allergies  Allergen Reactions   Amlodipine Other (See Comments)    Dizziness    Current Outpatient Medications  Medication Sig Dispense Refill   amLODipine (NORVASC) 5 MG tablet Take 1 tablet (5 mg total) by mouth daily. 90 tablet 1   aspirin EC (ASPIRIN LOW DOSE) 81 MG tablet Take 1 tablet (81 mg total) by mouth daily. 90 tablet 0   atorvastatin (LIPITOR) 40 MG tablet Take 1 tablet (40 mg total) by mouth daily. 90 tablet 3   Blood Glucose Monitoring Suppl (CONTOUR NEXT  MONITOR) w/Device KIT 1 Device by Does not apply route 4 (four) times daily - after meals and at bedtime. 1 kit 0   carvedilol (COREG) 3.125 MG tablet Take 1 tablet (3.125 mg total) by mouth 2 (two) times daily with a meal. 180 tablet 0   cinacalcet (SENSIPAR) 30 MG tablet Take by mouth.     cyclobenzaprine (FLEXERIL) 10 MG tablet Take 1 tablet (10 mg total) by mouth 3 (three) times daily as needed for muscle spasms. 30 tablet 0   enalapril (VASOTEC) 5 MG tablet Take 1 tablet (5 mg total) by mouth daily. 90 tablet 1   fluticasone (FLONASE) 50 MCG/ACT nasal spray Place 1 spray into both nostrils daily. 16 g 0   FOSRENOL 1000 MG PACK Take 1 packet by mouth 3 (three) times daily.     glucose blood (TRUE METRIX BLOOD GLUCOSE TEST) test strip Use as instructed. Check blood glucose level by fingerstick 3 times per day. 200 each 12   HYDROcodone-acetaminophen (NORCO/VICODIN) 5-325 MG tablet Take 1 tablet by mouth every 6 (six) hours as needed for moderate pain. 12 tablet 0   insulin glargine-yfgn (SEMGLEE) 100 UNIT/ML Pen Inject 30 Units into the skin daily. 15 mL 1   insulin lispro (HUMALOG KWIKPEN) 100 UNIT/ML KwikPen Inject 6 units prior to all meals. 1 hr after meals inject according to scale. For blood sugars 0-150 give 0 units of insulin, 151-200 give 2 units of insulin, 201-250 give 4 units, 251-300 give 6 units, 301-350 give 8 units, 351-400 give 10 units,> 400 give 12 units 15 mL 11   Insulin Pen Needle (B-D ULTRAFINE III SHORT PEN) 31G X 8 MM MISC 1 application  by Does not apply route 3 (three) times daily. E11.65 200 each 11   Lancet Devices (MICROLET NEXT LANCING DEVICE) MISC 1 Device by Does not apply route 3 (three) times daily. 1 each 11   Lanthanum Carbonate (FOSRENOL) 1000 MG PACK Take by mouth.     levETIRAcetam (KEPPRA XR) 500 MG 24 hr tablet Take 2 tablets (1,000 mg total) by mouth daily. 180 tablet 1   Microlet Lancets MISC 1 Device by Does not apply route 3 (three) times daily. E11.65  200 each 11   omeprazole (PRILOSEC) 20 MG capsule Take 1 capsule (20 mg total) by mouth daily. 30 capsule 3   topiramate (TOPAMAX) 50 MG tablet Take by mouth.     VELPHORO 500 MG chewable tablet Chew 500 mg by mouth 3 (three) times daily.     famotidine (PEPCID) 10 MG tablet Take  1 tablet (10 mg total) by mouth 2 (two) times daily. 60 tablet 1   traZODone (DESYREL) 150 MG tablet Take 1 tablet (150 mg total) by mouth at bedtime. 90 tablet 2   No current facility-administered medications for this visit.    REVIEW OF SYSTEMS:  _0  denotes positive finding, _1  denotes negative finding Cardiac  Comments:  Chest pain or chest pressure:    Shortness of breath upon exertion:    Short of breath when lying flat:    Irregular heart rhythm:        Vascular    Pain in calf, thigh, or hip brought on by ambulation:    Pain in feet at night that wakes you up from your sleep:     Blood clot in your veins:    Leg swelling:         Pulmonary    Oxygen at home:    Productive cough:     Wheezing:         Neurologic    Sudden weakness in arms or legs:     Sudden numbness in arms or legs:     Sudden onset of difficulty speaking or slurred speech:    Temporary loss of vision in one eye:     Problems with dizziness:         Gastrointestinal    Blood in stool:     Vomited blood:         Genitourinary    Burning when urinating:     Blood in urine:        Psychiatric    Major depression:         Hematologic    Bleeding problems:    Problems with blood clotting too easily:        Skin    Rashes or ulcers:        Constitutional    Fever or chills:      PHYSICAL EXAM: Vitals:   07/23/22 0921  BP: (!) 182/101  Pulse: 71  Temp: 97.6 F (36.4 C)  TempSrc: Temporal  SpO2: 98%  Weight: (!) 310 lb (140.6 kg)  Height: _2  (1.727 m)    GENERAL: The patient is a well-nourished male, in no acute distress. The vital signs are documented above. CARDIAC: There is a regular rate and  rhythm.  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 MUSCULOSKELETAL: There are no major deformities or cyanosis. NEUROLOGIC: No focal weakness or paresthesias are detected. SKIN: There are no ulcers or rashes noted. PSYCHIATRIC: The patient has a normal affect.    DATA:   None  Assessment/Plan:  57 year old male with ESRD that presents for evaluation of lesions over his left arm radiocephalic AV fistula.  Discussed that he has 2 tandem aneurysms as pictured above.  The skin appears intact with no open ulcers.  I do not feel these need revision at this time.  Discussed indication for revision are open ulcerations or active bleeding events with thinning skin.  I think we can continue to monitor his fistula.  Unfortunately he is has having some issues using the fistula.  It does feel pulsatile to me.  I have recommended a left arm fistulogram.  We will get this scheduled for Thursday in the Cath Lab.  Risk benefits discussed.   [name], MD [facility] [place] Office: [phone]

## 2022-07-24 DIAGNOSIS — E1129 Type 2 diabetes mellitus with other diabetic kidney complication: Secondary | ICD-10-CM | POA: Diagnosis not present

## 2022-07-24 DIAGNOSIS — R52 Pain, unspecified: Secondary | ICD-10-CM | POA: Diagnosis not present

## 2022-07-24 DIAGNOSIS — D509 Iron deficiency anemia, unspecified: Secondary | ICD-10-CM | POA: Diagnosis not present

## 2022-07-24 DIAGNOSIS — N2581 Secondary hyperparathyroidism of renal origin: Secondary | ICD-10-CM | POA: Diagnosis not present

## 2022-07-24 DIAGNOSIS — Z992 Dependence on renal dialysis: Secondary | ICD-10-CM | POA: Diagnosis not present

## 2022-07-24 DIAGNOSIS — D688 Other specified coagulation defects: Secondary | ICD-10-CM | POA: Diagnosis not present

## 2022-07-24 DIAGNOSIS — N186 End stage renal disease: Secondary | ICD-10-CM | POA: Diagnosis not present

## 2022-07-25 ENCOUNTER — Ambulatory Visit (HOSPITAL_BASED_OUTPATIENT_CLINIC_OR_DEPARTMENT_OTHER): Payer: Medicare Other | Admitting: Anesthesiology

## 2022-07-25 ENCOUNTER — Encounter (HOSPITAL_COMMUNITY)
Admission: RE | Disposition: A | Discharge status: Home / Self Care | Payer: Self-pay | Source: Home / Self Care | Attending: Vascular Surgery

## 2022-07-25 ENCOUNTER — Other Ambulatory Visit: Payer: Self-pay

## 2022-07-25 ENCOUNTER — Ambulatory Visit (HOSPITAL_COMMUNITY): Payer: Self-pay

## 2022-07-25 ENCOUNTER — Ambulatory Visit (HOSPITAL_COMMUNITY): Payer: Self-pay | Admitting: Anesthesiology

## 2022-07-25 ENCOUNTER — Ambulatory Visit (HOSPITAL_COMMUNITY)
Admission: RE | Admit: 2022-07-25 | Discharge: 2022-07-25 | Disposition: A | Discharge status: Home / Self Care | Payer: Self-pay | Attending: Vascular Surgery | Admitting: Vascular Surgery

## 2022-07-25 DIAGNOSIS — E1151 Type 2 diabetes mellitus with diabetic peripheral angiopathy without gangrene: Secondary | ICD-10-CM

## 2022-07-25 DIAGNOSIS — T82590A Other mechanical complication of surgically created arteriovenous fistula, initial encounter: Secondary | ICD-10-CM | POA: Diagnosis not present

## 2022-07-25 DIAGNOSIS — G4730 Sleep apnea, unspecified: Secondary | ICD-10-CM | POA: Insufficient documentation

## 2022-07-25 DIAGNOSIS — I120 Hypertensive chronic kidney disease with stage 5 chronic kidney disease or end stage renal disease: Secondary | ICD-10-CM | POA: Diagnosis not present

## 2022-07-25 DIAGNOSIS — G40909 Epilepsy, unspecified, not intractable, without status epilepticus: Secondary | ICD-10-CM | POA: Insufficient documentation

## 2022-07-25 DIAGNOSIS — E1122 Type 2 diabetes mellitus with diabetic chronic kidney disease: Secondary | ICD-10-CM

## 2022-07-25 DIAGNOSIS — Y832 Surgical operation with anastomosis, bypass or graft as the cause of abnormal reaction of the patient, or of later complication, without mention of misadventure at the time of the procedure: Secondary | ICD-10-CM | POA: Insufficient documentation

## 2022-07-25 DIAGNOSIS — Z794 Long term (current) use of insulin: Secondary | ICD-10-CM

## 2022-07-25 DIAGNOSIS — N186 End stage renal disease: Secondary | ICD-10-CM | POA: Insufficient documentation

## 2022-07-25 DIAGNOSIS — Z992 Dependence on renal dialysis: Secondary | ICD-10-CM

## 2022-07-25 DIAGNOSIS — T82510A Breakdown (mechanical) of surgically created arteriovenous fistula, initial encounter: Secondary | ICD-10-CM | POA: Insufficient documentation

## 2022-07-25 DIAGNOSIS — T82898A Other specified complication of vascular prosthetic devices, implants and grafts, initial encounter: Secondary | ICD-10-CM | POA: Diagnosis not present

## 2022-07-25 DIAGNOSIS — N185 Chronic kidney disease, stage 5: Secondary | ICD-10-CM | POA: Diagnosis not present

## 2022-07-25 HISTORY — PX: A/V FISTULAGRAM: CATH118298

## 2022-07-25 HISTORY — PX: INSERTION OF DIALYSIS CATHETER: SHX1324

## 2022-07-25 LAB — POCT I-STAT, CHEM 8
BUN: 50 mg/dL — ABNORMAL HIGH (ref 6–20)
Calcium, Ion: 1.09 mmol/L — ABNORMAL LOW (ref 1.15–1.40)
Chloride: 99 mmol/L (ref 98–111)
Creatinine, Ser: 8.2 mg/dL — ABNORMAL HIGH (ref 0.61–1.24)
Glucose, Bld: 310 mg/dL — ABNORMAL HIGH (ref 70–99)
HCT: 41 % (ref 39.0–52.0)
Hemoglobin: 13.9 g/dL (ref 13.0–17.0)
Potassium: 4 mmol/L (ref 3.5–5.1)
Sodium: 139 mmol/L (ref 135–145)
TCO2: 29 mmol/L (ref 22–32)

## 2022-07-25 LAB — GLUCOSE, CAPILLARY
Glucose-Capillary: 301 mg/dL — ABNORMAL HIGH (ref 70–99)
Glucose-Capillary: 290 mg/dL — ABNORMAL HIGH (ref 70–99)
Glucose-Capillary: 224 mg/dL — ABNORMAL HIGH (ref 70–99)

## 2022-07-25 SURGERY — INSERTION OF DIALYSIS CATHETER
Anesthesia: General

## 2022-07-25 SURGERY — A/V FISTULAGRAM
Anesthesia: LOCAL | Laterality: Left

## 2022-07-25 MED ORDER — PROPOFOL 10 MG/ML IV BOLUS
INTRAVENOUS | Status: AC
  Filled 2022-07-25: qty 20

## 2022-07-25 MED ORDER — PHENYLEPHRINE 80 MCG/ML (10ML) SYRINGE FOR IV PUSH (FOR BLOOD PRESSURE SUPPORT)
PREFILLED_SYRINGE | INTRAVENOUS | Status: DC | PRN
  Administered 2022-07-25: 240 ug via INTRAVENOUS

## 2022-07-25 MED ORDER — SODIUM CHLORIDE 0.9 % IV SOLN: INTRAVENOUS | Status: DC | PRN

## 2022-07-25 MED ORDER — PHENYLEPHRINE HCL-NACL 20-0.9 MG/250ML-% IV SOLN
INTRAVENOUS | Status: DC | PRN
  Administered 2022-07-25: 20 ug/min via INTRAVENOUS

## 2022-07-25 MED ORDER — FENTANYL CITRATE (PF) 100 MCG/2ML IJ SOLN
INTRAMUSCULAR | Status: AC
  Filled 2022-07-25: qty 2

## 2022-07-25 MED ORDER — HEPARIN SODIUM (PORCINE) 1000 UNIT/ML IJ SOLN
INTRAMUSCULAR | Status: AC
  Filled 2022-07-25: qty 10

## 2022-07-25 MED ORDER — HYDROCODONE-ACETAMINOPHEN 5-325 MG PO TABS: 1.0000 | ORAL_TABLET | Freq: Four times a day (QID) | ORAL | 0 refills | Status: DC | PRN

## 2022-07-25 MED ORDER — FENTANYL CITRATE (PF) 100 MCG/2ML IJ SOLN
25.0000 ug | INTRAMUSCULAR | Status: DC | PRN
  Administered 2022-07-25 (×2): 50 ug via INTRAVENOUS

## 2022-07-25 MED ORDER — MIDAZOLAM HCL 2 MG/2ML IJ SOLN
INTRAMUSCULAR | Status: DC | PRN
  Administered 2022-07-25: 2 mg via INTRAVENOUS

## 2022-07-25 MED ORDER — MIDAZOLAM HCL 2 MG/2ML IJ SOLN
INTRAMUSCULAR | Status: AC
  Filled 2022-07-25: qty 2

## 2022-07-25 MED ORDER — FENTANYL CITRATE (PF) 250 MCG/5ML IJ SOLN
INTRAMUSCULAR | Status: DC | PRN
  Administered 2022-07-25: 50 ug via INTRAVENOUS

## 2022-07-25 MED ORDER — SODIUM CHLORIDE 0.9% FLUSH: 3.0000 mL | INTRAVENOUS | Status: DC | PRN

## 2022-07-25 MED ORDER — PROPOFOL 10 MG/ML IV BOLUS
INTRAVENOUS | Status: DC | PRN
  Administered 2022-07-25: 200 mg via INTRAVENOUS
  Administered 2022-07-25: 50 mg via INTRAVENOUS

## 2022-07-25 MED ORDER — LIDOCAINE HCL (PF) 1 % IJ SOLN
INTRAMUSCULAR | Status: AC
  Filled 2022-07-25: qty 30

## 2022-07-25 MED ORDER — HEPARIN (PORCINE) IN NACL 1000-0.9 UT/500ML-% IV SOLN
INTRAVENOUS | Status: AC
  Filled 2022-07-25: qty 500

## 2022-07-25 MED ORDER — LIDOCAINE HCL (PF) 1 % IJ SOLN
INTRAMUSCULAR | Status: DC | PRN
  Administered 2022-07-25: 5 mL

## 2022-07-25 MED ORDER — LIDOCAINE 2% (20 MG/ML) 5 ML SYRINGE
INTRAMUSCULAR | Status: DC | PRN
  Administered 2022-07-25: 60 mg via INTRAVENOUS

## 2022-07-25 MED ORDER — ONDANSETRON HCL 4 MG/2ML IJ SOLN
INTRAMUSCULAR | Status: DC | PRN
  Administered 2022-07-25: 4 mg via INTRAVENOUS

## 2022-07-25 MED ORDER — HEPARIN SODIUM (PORCINE) 1000 UNIT/ML IJ SOLN
INTRAMUSCULAR | Status: DC | PRN
  Administered 2022-07-25: 1900 [IU]

## 2022-07-25 MED ORDER — IODIXANOL 320 MG/ML IV SOLN
INTRAVENOUS | Status: DC | PRN
  Administered 2022-07-25: 40 mL

## 2022-07-25 MED ORDER — DEXTROSE 5 % IV SOLN
INTRAVENOUS | Status: DC | PRN
  Administered 2022-07-25: 3 g via INTRAVENOUS

## 2022-07-25 MED ORDER — FENTANYL CITRATE (PF) 250 MCG/5ML IJ SOLN
INTRAMUSCULAR | Status: AC
  Filled 2022-07-25: qty 5

## 2022-07-25 MED ORDER — HYDROCODONE-ACETAMINOPHEN 5-325 MG PO TABS: 1.0000 | ORAL_TABLET | ORAL | 0 refills | Status: DC | PRN

## 2022-07-25 MED ORDER — CEFAZOLIN SODIUM 1 G IJ SOLR
INTRAMUSCULAR | Status: AC
  Filled 2022-07-25: qty 30

## 2022-07-25 MED ORDER — HEPARIN 6000 UNIT IRRIGATION SOLUTION
Status: DC | PRN
  Administered 2022-07-25: 1

## 2022-07-25 MED ORDER — HEPARIN (PORCINE) IN NACL 1000-0.9 UT/500ML-% IV SOLN
INTRAVENOUS | Status: DC | PRN
  Administered 2022-07-25: 500 mL

## 2022-07-25 SURGICAL SUPPLY — 8 items
BAG SNAP BAND KOVER 36X36 (MISCELLANEOUS) ×1
COVER DOME SNAP 22 D (MISCELLANEOUS) ×1
KIT MICROPUNCTURE NIT STIFF (SHEATH) ×1
PROTECTION STATION PRESSURIZED (MISCELLANEOUS) ×2
SHEATH PROBE COVER 6X72 (BAG) ×2
STOPCOCK MORSE 400PSI 3WAY (MISCELLANEOUS) ×2
TRAY PV CATH (CUSTOM PROCEDURE TRAY) ×1
TUBING CIL FLEX 10 FLL-RA (TUBING) ×1

## 2022-07-25 SURGICAL SUPPLY — 46 items
BAG COUNTER SPONGE SURGICOUNT (BAG) ×1 IMPLANT
BAG DECANTER FOR FLEXI CONT (MISCELLANEOUS) ×1 IMPLANT
BIOPATCH RED 1 DISK 7.0 (GAUZE/BANDAGES/DRESSINGS) ×1 IMPLANT
CATH PALINDROME-P 19CM W/VT (CATHETERS) IMPLANT
CATH PALINDROME-P 23CM W/VT (CATHETERS) IMPLANT
CATH PALINDROME-P 28CM W/VT (CATHETERS) IMPLANT
CATH STRAIGHT 5FR 65CM (CATHETERS) IMPLANT
COVER PROBE W GEL 5X96 (DRAPES) ×1 IMPLANT
COVER SURGICAL LIGHT HANDLE (MISCELLANEOUS) ×1 IMPLANT
DERMABOND ADVANCED .7 DNX12 (GAUZE/BANDAGES/DRESSINGS) ×1 IMPLANT
DRAPE C-ARM 42X72 X-RAY (DRAPES) ×1 IMPLANT
DRAPE CHEST BREAST 15X10 FENES (DRAPES) ×1 IMPLANT
DRSG COVADERM 4X6 (GAUZE/BANDAGES/DRESSINGS) IMPLANT
GAUZE 4X4 16PLY ~~LOC~~+RFID DBL (SPONGE) ×1 IMPLANT
GAUZE SPONGE 4X4 12PLY STRL (GAUZE/BANDAGES/DRESSINGS) IMPLANT
GLOVE BIO SURGEON STRL SZ7.5 (GLOVE) ×1 IMPLANT
GLOVE BIOGEL PI IND STRL 8 (GLOVE) ×1 IMPLANT
GOWN STRL REUS W/ TWL LRG LVL3 (GOWN DISPOSABLE) ×2 IMPLANT
GOWN STRL REUS W/ TWL XL LVL3 (GOWN DISPOSABLE) ×2 IMPLANT
GOWN STRL REUS W/TWL LRG LVL3 (GOWN DISPOSABLE) ×2
GOWN STRL REUS W/TWL XL LVL3 (GOWN DISPOSABLE) ×2
KIT BASIN OR (CUSTOM PROCEDURE TRAY) ×1 IMPLANT
KIT PALINDROME-P 55CM (CATHETERS) IMPLANT
KIT TURNOVER KIT B (KITS) ×1 IMPLANT
NDL 18GX1X1/2 (RX/OR ONLY) (NEEDLE) ×1 IMPLANT
NDL HYPO 25GX1X1/2 BEV (NEEDLE) ×1 IMPLANT
NEEDLE 18GX1X1/2 (RX/OR ONLY) (NEEDLE) ×1 IMPLANT
NEEDLE HYPO 25GX1X1/2 BEV (NEEDLE) ×1 IMPLANT
NS IRRIG 1000ML POUR BTL (IV SOLUTION) ×1 IMPLANT
PACK BASIC III (CUSTOM PROCEDURE TRAY) ×1
PACK SRG BSC III STRL LF ECLPS (CUSTOM PROCEDURE TRAY) ×1 IMPLANT
PAD ARMBOARD 7.5X6 YLW CONV (MISCELLANEOUS) ×2 IMPLANT
SET MICROPUNCTURE 5F STIFF (MISCELLANEOUS) IMPLANT
SOAP 2 % CHG 4 OZ (WOUND CARE) ×1 IMPLANT
SPIKE FLUID TRANSFER (MISCELLANEOUS) ×1 IMPLANT
SUT ETHILON 3 0 PS 1 (SUTURE) ×1 IMPLANT
SUT MNCRL AB 4-0 PS2 18 (SUTURE) ×1 IMPLANT
SYR 10ML LL (SYRINGE) ×1 IMPLANT
SYR 20ML LL LF (SYRINGE) ×2 IMPLANT
SYR 5ML LL (SYRINGE) ×1 IMPLANT
SYR CONTROL 10ML LL (SYRINGE) ×1 IMPLANT
TOWEL GREEN STERILE (TOWEL DISPOSABLE) ×1 IMPLANT
TOWEL GREEN STERILE FF (TOWEL DISPOSABLE) ×1 IMPLANT
WATER STERILE IRR 1000ML POUR (IV SOLUTION) ×1 IMPLANT
WIRE AMPLATZ SS-J .035X180CM (WIRE) IMPLANT
WIRE BENTSON .035X145CM (WIRE) IMPLANT

## 2022-07-25 NOTE — Anesthesia Postprocedure Evaluation (Signed)
Anesthesia Post Note  Patient: Jeremy [name].  Procedure(s) Performed: INSERTION OF DIALYSIS CATHETER     Patient location during evaluation: PACU Anesthesia Type: General Level of consciousness: awake Pain management: pain level controlled Vital Signs Assessment: post-procedure vital signs reviewed and stable Respiratory status: spontaneous breathing, nonlabored ventilation and respiratory function stable Cardiovascular status: blood pressure returned to baseline and stable Postop Assessment: no apparent nausea or vomiting Anesthetic complications: no   No notable events documented.  Last Vitals:  Vitals:   07/25/22 1800 07/25/22 1815  BP: (!) 174/91 (!) 162/97  Pulse: 66 64  Resp: 11 10  Temp:  36.4 C  SpO2: 96% 97%    Last Pain:  Vitals:   07/25/22 1745  TempSrc:   PainSc: Asleep                 Nilda Simmer

## 2022-07-25 NOTE — Progress Notes (Signed)
Patient is now agreeable for [facility] placement given occluded upper arm cephalic vein with poorly functioning left arm AV fistula.  Will post for OR this afternoon as patient prefers sedation and not agreeable to cath lab.  [name], MD [facility] [place] Office: [place]

## 2022-07-25 NOTE — Op Note (Signed)
    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  The cephalic vein occludes at the antecubitum with no outflow in the upper arm.  The outflow of the cephalic vein in the forearm is through collaterals then draining into the basilic and brachial veins.  Patient will need new access with a TDC.  I have offered to do this this afternoon but he wants next week.   SPECIMEN(S):  None  CONTRAST: 40 mL  INDICATIONS: Jeremy [name]. is a 57 y.o. male who  presents with malfunctioning left radiocephalic arteriovenous fistula.  The patient is scheduled for left arm fistulogram.  The patient is aware the risks include but are not limited to: bleeding, infection, thrombosis of the cannulated access, and possible anaphylactic reaction to the contrast.  The patient is aware of the risks of the procedure and elects to proceed forward.  DESCRIPTION: After full informed written consent was obtained, the patient was brought back to the angiography suite and placed supine upon the angiography table.  The patient was connected to monitoring equipment.  The left arm was prepped and draped in the standard fashion for a left arm fistulogram.  Under ultrasound guidance, the left arm arteriovenous fistula was evaluated, it was patent, an image was saved.  It was cannulated with a micropuncture needle.  The microwire was advanced into the fistula and the needle was exchanged for the a microsheath, which was lodged 2 cm into the access.  The wire was removed and the sheath was connected to the IV extension tubing.  Hand injections  were completed to image the access from the antecubitum up to the level of axilla.  The central venous structures were also imaged by hand injections.  Based on the images, this patient will need: new left arm access.  A 4-0 Monocryl purse-string suture was sewn around the sheath.  The sheath was removed while tying down the suture.  A sterile bandage was applied to the puncture site.  COMPLICATIONS: None  CONDITION: Stable  [name], MD [facility] Office: [place]   07/25/2022 1:09 PM

## 2022-07-25 NOTE — Progress Notes (Signed)
Left arm fistula dressing D/C/I. Pt NPO till we hear from Dr Amado Nash.

## 2022-07-25 NOTE — Op Note (Signed)
Date: July 25, 2022  Preoperative diagnosis: 1.  End-stage renal disease 2.  Malfunctioning and occluded left arm AV fistula  Postoperative diagnosis: Same  Procedure: 1.  Ultrasound-guided access right internal jugular vein 2.  Placement of right internal jugular vein tunneled dialysis catheter (23 cm palindrome)  Surgeon: Dr. [name], MD  Assistant: OR staff  Dictations: 57 year old male with end-stage renal disease.  He was seen in the office on Tuesday with a malfunctioning left arm AV fistula that is a radiocephalic.  He underwent fistulogram today that showed occlusion of the upper arm cephalic vein with no outflow except for collaterals.  He presents for tunneled dialysis catheter placement since he does not have reliable access after risks benefits discussed.  Findings: Successful ultrasound-guided access right internal jugular vein.  Placement of 23 cm palindrome catheter with tip in the right atrium.  This catheter flushed and aspirated easily.  Anesthesia: LMA  Details: Patient was taken to the operating room after informed consent was obtained.  Placed on the operative table in supine position.  Anesthesia was induced.  The bilateral necks were then prepped and draped in standard sterile fashion.  Timeout performed.  Patient got preoperative antibiotics.  Evaluated the right internal jugular vein with ultrasound, it was patent, an image was saved.  This was accessed with a 18-gauge needle under ultrasound guidance in the right internal jugular vein.  Placed a J-wire but this would not advance so then exchanged for a Bentson wire that advanced without resistance.  I then measured a palindrome catheter on the chest wall using a 23 cm catheter.  I then made a counterincision on the right chest wall and tunneled the catheter from the chest wall exit site to the internal jugular vein stick site.  I then dilated over the wire under fluoroscopic guidance and placed a large  dilator peel-away sheath.  I then advanced the tip of the catheter through the peel-away sheath into the right atrium and then peeled the sheath away.  The catheter was positioned appropriately under fluoroscopic guidance.  It flushed and aspirated easily.  It was secured with multiple 3-0 nylons.  This neck stick site was closed with a 4-0 Monocryl subcuticular with Dermabond.  The catheter was loaded according to manufacturer's recommendations.  Taken to recovery in stable condition.    Complication: None  Condition Stable   [name], MD [facility] [place] Office: [place]

## 2022-07-25 NOTE — Anesthesia Preprocedure Evaluation (Addendum)
Anesthesia Evaluation  Patient identified by MRN, date of birth, ID band Patient awake    Reviewed: Allergy & Precautions, NPO status , Patient's Chart, lab work & pertinent test results, reviewed documented beta blocker date and time   Airway Mallampati: II  TM Distance: >3 FB Neck ROM: Full    Dental  (+) Edentulous Upper, Loose, Missing, Dental Advisory Given,    Pulmonary sleep apnea    Pulmonary exam normal breath sounds clear to auscultation       Cardiovascular hypertension, Pt. on medications and Pt. on home beta blockers + Peripheral Vascular Disease  Normal cardiovascular exam Rhythm:Regular Rate:Normal  TTE 2019 - Technically difficult study with poor acoustic windows. Normal LV    size with mild focal basal septal hypertrophy. EF 45-50%, diffuse    mild hypokinesis. Normal RV size and systolic function.     Neuro/Psych  Headaches, Seizures - (on keppra), Well Controlled,   negative psych ROS   GI/Hepatic negative GI ROS, Neg liver ROS,,,  Endo/Other  diabetes, Type 2, Insulin Dependent    Renal/GU ESRF and DialysisRenal disease (dialysis MWF)  negative genitourinary   Musculoskeletal negative musculoskeletal ROS (+)    Abdominal   Peds  Hematology negative hematology ROS (+)   Anesthesia Other Findings malfunctioning left radiocephalic arteriovenous fistula  Reproductive/Obstetrics                             Anesthesia Physical Anesthesia Plan  ASA: 3  Anesthesia Plan: General   Post-op Pain Management:    Induction: Intravenous  PONV Risk Score and Plan: 2 and Ondansetron, Dexamethasone and Midazolam  Airway Management Planned: LMA  Additional Equipment:   Intra-op Plan:   Post-operative Plan: Extubation in OR  Informed Consent: I have reviewed the patients History and Physical, chart, labs and discussed the procedure including the risks, benefits and  alternatives for the proposed anesthesia with the patient or authorized representative who has indicated his/her understanding and acceptance.     Dental advisory given  Plan Discussed with: CRNA  Anesthesia Plan Comments:        Anesthesia Quick Evaluation

## 2022-07-25 NOTE — H&P (Signed)
History and Physical Interval Note:  07/25/2022 11:46 AM  Jeremy [name].  has presented today for surgery, with the diagnosis of end stage renal disease.  The various methods of treatment have been discussed with the patient and family. After consideration of risks, benefits and other options for treatment, the patient has consented to  Procedure(s): A/V Fistulagram (Left) as a surgical intervention.  The patient's history has been reviewed, patient examined, no change in status, stable for surgery.  I have reviewed the patient's chart and labs.  Questions were answered to the patient's satisfaction.     Jeremy [name]     Patient name: Jeremy [name].       MRN: [id number]        DOB: 05-31-1966          Sex: male   REASON FOR CONSULT: Evaluate lesions over left arm AV fistula   HPI: Jeremy [name]. is a 57 y.o. male, with end-stage renal disease on hemodialysis Monday Wednesday Friday that presents for evaluation of lesions over left arm AV fistula.  Patient previously had a left radiocephalic AV fistula on 7/59/1638 that had to be subsequently revised with sidebranch ligation.  He is referred by Dr. Reeves Dam with [facility].  He states he has had no bleeding events.  States they are having issues using the fistula and having to reposition the needle multiple times with the machine alarming.       Past Medical History:  Diagnosis Date   Abscess     AKI (acute kidney injury) ([place]) 08/05/2017   Bell's palsy      right eye abn since dx bell's palsy   CKD (chronic kidney disease)      Dr. McDiarmind    Dyspnea      occasional with exertion   Headache     High cholesterol     Hypertension     Left shoulder pain 10/26/2013    S/p injection on 10/26/13    Renal insufficiency 02/14/2014   Seizures ([place]) 08/04/2017    "related to high blood sugars" (08/05/2017)   Type II diabetes mellitus ([place])             Past Surgical History:  Procedure Laterality Date   AV FISTULA  PLACEMENT Left 11/11/2019    Procedure: LEFT FOREARM RADIOCEPHALIC ARTERIOVENOUS (AV) FISTULA CREATION;  Surgeon: Jeremy Heck, MD;  Location: [facility];  Service: Vascular;  Laterality: Left;   Eyelid surgery Right     I & D EXTREMITY   11/11/2011    Procedure: IRRIGATION AND DEBRIDEMENT EXTREMITY;  Surgeon: Merrie Roof, MD;  Location: [facility];  Service: General;  Laterality: Right;  I&D Right Thigh Abscess   IR FLUORO GUIDE CV LINE RIGHT   08/12/2019   IR US GUIDE VASC ACCESS RIGHT   08/12/2019   REVISON OF ARTERIOVENOUS FISTULA Left 03/23/2020    Procedure: LEFT ARM ARTERIOVENOUS FISTULA REVISON, LIGATION OF SIDE BRANCHES AND ELEVATION;  Surgeon: Jeremy Heck, MD;  Location: [facility];  Service: Vascular;  Laterality: Left;           Family History  Problem Relation Age of Onset   Diabetes Mother     Heart disease Mother 20   Diabetes Sister     Diabetes Brother     Diabetes Brother     Cancer Maternal Uncle          type unknown   Seizures Niece  Stroke Neg Hx        SOCIAL HISTORY: Social History         Socioeconomic History   Marital status: Divorced      Spouse name: Not on file   Number of children: 3   Years of education: Not on file   Highest education level: Not on file  Occupational History   Occupation: maintenance tech   Occupation: Disablity  Tobacco Use   Smoking status: Never   Smokeless tobacco: Never  Vaping Use   Vaping Use: Never used  Substance and Sexual Activity   Alcohol use: Not Currently   Drug use: No   Sexual activity: Yes  Other Topics Concern   Not on file  Social History Narrative    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  Did not graduate high school.  Has 3 adult children in [place].  Divorced.  Lives with girlfriend [name].                              Social Determinants of Health        Financial Resource Strain: Not on file  Food Insecurity: Not on file  Transportation  Needs: Not on file  Physical Activity: Unknown (08/31/2019)    Exercise Vital Sign     Days of Exercise per Week: 2 days     Minutes of Exercise per Session: Not on file  Stress: Not on file  Social Connections: Not on file  Intimate Partner Violence: Not on file           Allergies  Allergen Reactions   Amlodipine Other (See Comments)      Dizziness            Current Outpatient Medications  Medication Sig Dispense Refill   amLODipine (NORVASC) 5 MG tablet Take 1 tablet (5 mg total) by mouth daily. 90 tablet 1   aspirin EC (ASPIRIN LOW DOSE) 81 MG tablet Take 1 tablet (81 mg total) by mouth daily. 90 tablet 0   atorvastatin (LIPITOR) 40 MG tablet Take 1 tablet (40 mg total) by mouth daily. 90 tablet 3   Blood Glucose Monitoring Suppl (CONTOUR NEXT MONITOR) w/Device KIT 1 Device by Does not apply route 4 (four) times daily - after meals and at bedtime. 1 kit 0   carvedilol (COREG) 3.125 MG tablet Take 1 tablet (3.125 mg total) by mouth 2 (two) times daily with a meal. 180 tablet 0   cinacalcet (SENSIPAR) 30 MG tablet Take by mouth.       cyclobenzaprine (FLEXERIL) 10 MG tablet Take 1 tablet (10 mg total) by mouth 3 (three) times daily as needed for muscle spasms. 30 tablet 0   enalapril (VASOTEC) 5 MG tablet Take 1 tablet (5 mg total) by mouth daily. 90 tablet 1   fluticasone (FLONASE) 50 MCG/ACT nasal spray Place 1 spray into both nostrils daily. 16 g 0   FOSRENOL 1000 MG PACK Take 1 packet by mouth 3 (three) times daily.       glucose blood (TRUE METRIX BLOOD GLUCOSE TEST) test strip Use as instructed. Check blood glucose level by fingerstick 3 times per day. 200 each 12   HYDROcodone-acetaminophen (NORCO/VICODIN) 5-325 MG tablet Take 1 tablet by mouth every 6 (six) hours as needed for moderate pain. 12 tablet 0   insulin glargine-yfgn (SEMGLEE) 100 UNIT/ML Pen Inject 30 Units into the skin daily. 15 mL 1  insulin lispro (HUMALOG KWIKPEN) 100 UNIT/ML KwikPen Inject 6 units prior to  all meals. 1 hr after meals inject according to scale. For blood sugars 0-150 give 0 units of insulin, 151-200 give 2 units of insulin, 201-250 give 4 units, 251-300 give 6 units, 301-350 give 8 units, 351-400 give 10 units,> 400 give 12 units 15 mL 11   Insulin Pen Needle (B-D ULTRAFINE III SHORT PEN) 31G X 8 MM MISC 1 application  by Does not apply route 3 (three) times daily. E11.65 200 each 11   Lancet Devices (MICROLET NEXT LANCING DEVICE) MISC 1 Device by Does not apply route 3 (three) times daily. 1 each 11   Lanthanum Carbonate (FOSRENOL) 1000 MG PACK Take by mouth.       levETIRAcetam (KEPPRA XR) 500 MG 24 hr tablet Take 2 tablets (1,000 mg total) by mouth daily. 180 tablet 1   Microlet Lancets MISC 1 Device by Does not apply route 3 (three) times daily. E11.65 200 each 11   omeprazole (PRILOSEC) 20 MG capsule Take 1 capsule (20 mg total) by mouth daily. 30 capsule 3   topiramate (TOPAMAX) 50 MG tablet Take by mouth.       VELPHORO 500 MG chewable tablet Chew 500 mg by mouth 3 (three) times daily.       famotidine (PEPCID) 10 MG tablet Take 1 tablet (10 mg total) by mouth 2 (two) times daily. 60 tablet 1   traZODone (DESYREL) 150 MG tablet Take 1 tablet (150 mg total) by mouth at bedtime. 90 tablet 2    No current facility-administered medications for this visit.      REVIEW OF SYSTEMS:  _0  denotes positive finding, _1  denotes negative finding Cardiac   Comments:  Chest pain or chest pressure:      Shortness of breath upon exertion:      Short of breath when lying flat:      Irregular heart rhythm:             Vascular      Pain in calf, thigh, or hip brought on by ambulation:      Pain in feet at night that wakes you up from your sleep:       Blood clot in your veins:      Leg swelling:              Pulmonary      Oxygen at home:      Productive cough:       Wheezing:              Neurologic      Sudden weakness in arms or legs:       Sudden numbness in arms or legs:        Sudden onset of difficulty speaking or slurred speech:      Temporary loss of vision in one eye:       Problems with dizziness:              Gastrointestinal      Blood in stool:       Vomited blood:              Genitourinary      Burning when urinating:       Blood in urine:             Psychiatric      Major depression:  Hematologic      Bleeding problems:      Problems with blood clotting too easily:             Skin      Rashes or ulcers:             Constitutional      Fever or chills:          PHYSICAL EXAM:    Vitals:    07/23/22 0921  BP: (!) 182/101  Pulse: 71  Temp: 97.6 F (36.4 C)  TempSrc: Temporal  SpO2: 98%  Weight: (!) 310 lb (140.6 kg)  Height: _0  (1.727 m)      GENERAL: The patient is a well-nourished male, in no acute distress. The vital signs are documented above. CARDIAC: There is a regular rate and rhythm.  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 MUSCULOSKELETAL: There are no major deformities or cyanosis. NEUROLOGIC: No focal weakness or paresthesias are detected. SKIN: There are no ulcers or rashes noted. PSYCHIATRIC: The patient has a normal affect.      DATA:    None   Assessment/Plan:   57 year old male with ESRD that presents for evaluation of lesions over his left arm radiocephalic AV fistula.  Discussed that he has 2 tandem aneurysms as pictured above.  The skin appears intact with no open ulcers.  I do not feel these need revision at this time.  Discussed indication for revision are open ulcerations or active bleeding events with thinning skin.  I think we can continue to monitor his fistula.  Unfortunately he is has having some issues using the fistula.  It does feel pulsatile to me.  I have recommended a left arm fistulogram.  We will get this scheduled for Thursday in the  Cath Lab.  Risk benefits discussed.     Jeremy Heck, MD [facility] Office: [phone]

## 2022-07-25 NOTE — Transfer of Care (Signed)
Immediate Anesthesia Transfer of Care Note  Patient: Jeremy [name].  Procedure(s) Performed: INSERTION OF DIALYSIS CATHETER  Patient Location: PACU  Anesthesia Type:General  Level of Consciousness: drowsy and patient cooperative  Airway & Oxygen Therapy: Patient Spontanous Breathing  Post-op Assessment: Report given to RN and Post -op Vital signs reviewed and stable  Post vital signs: Reviewed and stable  Last Vitals:  Vitals Value Taken Time  BP 180/95 07/25/22 1715  Temp 36.6 C 07/25/22 1715  Pulse 64 07/25/22 1720  Resp 15 07/25/22 1720  SpO2 98 % 07/25/22 1720  Vitals shown include unvalidated device data.  Last Pain:  Vitals:   07/25/22 1256  TempSrc:   PainSc: 0-No pain         Complications: No notable events documented.

## 2022-07-26 ENCOUNTER — Encounter (HOSPITAL_COMMUNITY): Payer: Self-pay | Admitting: Vascular Surgery

## 2022-07-26 DIAGNOSIS — D509 Iron deficiency anemia, unspecified: Secondary | ICD-10-CM | POA: Diagnosis not present

## 2022-07-26 DIAGNOSIS — Z992 Dependence on renal dialysis: Secondary | ICD-10-CM | POA: Diagnosis not present

## 2022-07-26 DIAGNOSIS — D688 Other specified coagulation defects: Secondary | ICD-10-CM | POA: Diagnosis not present

## 2022-07-26 DIAGNOSIS — R52 Pain, unspecified: Secondary | ICD-10-CM | POA: Diagnosis not present

## 2022-07-26 DIAGNOSIS — N186 End stage renal disease: Secondary | ICD-10-CM | POA: Diagnosis not present

## 2022-07-26 DIAGNOSIS — N2581 Secondary hyperparathyroidism of renal origin: Secondary | ICD-10-CM | POA: Diagnosis not present

## 2022-07-26 DIAGNOSIS — E1129 Type 2 diabetes mellitus with other diabetic kidney complication: Secondary | ICD-10-CM | POA: Diagnosis not present

## 2022-07-29 ENCOUNTER — Other Ambulatory Visit: Payer: Self-pay

## 2022-07-29 DIAGNOSIS — R52 Pain, unspecified: Secondary | ICD-10-CM | POA: Diagnosis not present

## 2022-07-29 DIAGNOSIS — Z992 Dependence on renal dialysis: Secondary | ICD-10-CM | POA: Diagnosis not present

## 2022-07-29 DIAGNOSIS — D509 Iron deficiency anemia, unspecified: Secondary | ICD-10-CM | POA: Diagnosis not present

## 2022-07-29 DIAGNOSIS — E1129 Type 2 diabetes mellitus with other diabetic kidney complication: Secondary | ICD-10-CM | POA: Diagnosis not present

## 2022-07-29 DIAGNOSIS — D688 Other specified coagulation defects: Secondary | ICD-10-CM | POA: Diagnosis not present

## 2022-07-29 DIAGNOSIS — N186 End stage renal disease: Secondary | ICD-10-CM | POA: Diagnosis not present

## 2022-07-29 DIAGNOSIS — N2581 Secondary hyperparathyroidism of renal origin: Secondary | ICD-10-CM | POA: Diagnosis not present

## 2022-07-31 DIAGNOSIS — N2581 Secondary hyperparathyroidism of renal origin: Secondary | ICD-10-CM | POA: Diagnosis not present

## 2022-07-31 DIAGNOSIS — D509 Iron deficiency anemia, unspecified: Secondary | ICD-10-CM | POA: Diagnosis not present

## 2022-07-31 DIAGNOSIS — E1129 Type 2 diabetes mellitus with other diabetic kidney complication: Secondary | ICD-10-CM | POA: Diagnosis not present

## 2022-07-31 DIAGNOSIS — D688 Other specified coagulation defects: Secondary | ICD-10-CM | POA: Diagnosis not present

## 2022-07-31 DIAGNOSIS — N186 End stage renal disease: Secondary | ICD-10-CM | POA: Diagnosis not present

## 2022-07-31 DIAGNOSIS — Z992 Dependence on renal dialysis: Secondary | ICD-10-CM | POA: Diagnosis not present

## 2022-07-31 DIAGNOSIS — R52 Pain, unspecified: Secondary | ICD-10-CM | POA: Diagnosis not present

## 2022-08-02 DIAGNOSIS — N186 End stage renal disease: Secondary | ICD-10-CM | POA: Diagnosis not present

## 2022-08-02 DIAGNOSIS — D688 Other specified coagulation defects: Secondary | ICD-10-CM | POA: Diagnosis not present

## 2022-08-02 DIAGNOSIS — Z992 Dependence on renal dialysis: Secondary | ICD-10-CM | POA: Diagnosis not present

## 2022-08-02 DIAGNOSIS — N2581 Secondary hyperparathyroidism of renal origin: Secondary | ICD-10-CM | POA: Diagnosis not present

## 2022-08-02 DIAGNOSIS — R52 Pain, unspecified: Secondary | ICD-10-CM | POA: Diagnosis not present

## 2022-08-02 DIAGNOSIS — E1129 Type 2 diabetes mellitus with other diabetic kidney complication: Secondary | ICD-10-CM | POA: Diagnosis not present

## 2022-08-02 DIAGNOSIS — D509 Iron deficiency anemia, unspecified: Secondary | ICD-10-CM | POA: Diagnosis not present

## 2022-08-05 DIAGNOSIS — D688 Other specified coagulation defects: Secondary | ICD-10-CM | POA: Diagnosis not present

## 2022-08-05 DIAGNOSIS — R52 Pain, unspecified: Secondary | ICD-10-CM | POA: Diagnosis not present

## 2022-08-05 DIAGNOSIS — Z992 Dependence on renal dialysis: Secondary | ICD-10-CM | POA: Diagnosis not present

## 2022-08-05 DIAGNOSIS — E1129 Type 2 diabetes mellitus with other diabetic kidney complication: Secondary | ICD-10-CM | POA: Diagnosis not present

## 2022-08-05 DIAGNOSIS — N186 End stage renal disease: Secondary | ICD-10-CM | POA: Diagnosis not present

## 2022-08-05 DIAGNOSIS — N2581 Secondary hyperparathyroidism of renal origin: Secondary | ICD-10-CM | POA: Diagnosis not present

## 2022-08-05 DIAGNOSIS — D509 Iron deficiency anemia, unspecified: Secondary | ICD-10-CM | POA: Diagnosis not present

## 2022-08-06 ENCOUNTER — Ambulatory Visit: Payer: Medicare Other | Attending: Nurse Practitioner | Admitting: Pharmacist

## 2022-08-06 ENCOUNTER — Other Ambulatory Visit: Payer: Self-pay

## 2022-08-06 VITALS — BP 147/84

## 2022-08-06 DIAGNOSIS — G40909 Epilepsy, unspecified, not intractable, without status epilepticus: Secondary | ICD-10-CM | POA: Insufficient documentation

## 2022-08-06 DIAGNOSIS — I120 Hypertensive chronic kidney disease with stage 5 chronic kidney disease or end stage renal disease: Secondary | ICD-10-CM | POA: Insufficient documentation

## 2022-08-06 DIAGNOSIS — I10 Essential (primary) hypertension: Secondary | ICD-10-CM | POA: Diagnosis not present

## 2022-08-06 DIAGNOSIS — E1151 Type 2 diabetes mellitus with diabetic peripheral angiopathy without gangrene: Secondary | ICD-10-CM

## 2022-08-06 DIAGNOSIS — E1122 Type 2 diabetes mellitus with diabetic chronic kidney disease: Secondary | ICD-10-CM | POA: Insufficient documentation

## 2022-08-06 DIAGNOSIS — Z794 Long term (current) use of insulin: Secondary | ICD-10-CM | POA: Diagnosis not present

## 2022-08-06 DIAGNOSIS — E669 Obesity, unspecified: Secondary | ICD-10-CM | POA: Insufficient documentation

## 2022-08-06 DIAGNOSIS — N186 End stage renal disease: Secondary | ICD-10-CM | POA: Insufficient documentation

## 2022-08-06 DIAGNOSIS — G4733 Obstructive sleep apnea (adult) (pediatric): Secondary | ICD-10-CM | POA: Insufficient documentation

## 2022-08-06 MED ORDER — INSULIN GLARGINE-YFGN 100 UNIT/ML ~~LOC~~ SOPN
40.0000 [IU] | PEN_INJECTOR | Freq: Every day | SUBCUTANEOUS | 1 refills | Status: DC
  Filled 2022-08-06: qty 15, 37d supply, fill #0

## 2022-08-06 MED ORDER — LANTUS SOLOSTAR 100 UNIT/ML ~~LOC~~ SOPN
PEN_INJECTOR | SUBCUTANEOUS | 2 refills | Status: DC
  Filled 2022-08-06: qty 15, 25d supply, fill #0
  Filled 2022-08-16: qty 30, 60d supply, fill #0

## 2022-08-06 MED ORDER — FREESTYLE LIBRE 2 SENSOR MISC
3 refills | Status: DC
  Filled 2022-08-06 – 2022-08-16 (×2): qty 2, 28d supply, fill #0

## 2022-08-06 MED ORDER — FREESTYLE LIBRE 2 READER DEVI
0 refills | Status: DC
  Filled 2022-08-06 – 2022-08-16 (×2): qty 1, 30d supply, fill #0

## 2022-08-06 MED ORDER — CONTOUR NEXT TEST VI STRP
ORAL_STRIP | 12 refills | Status: DC
  Filled 2022-08-06: qty 100, 30d supply, fill #0
  Filled 2022-08-16: qty 100, 33d supply, fill #0
  Filled 2022-11-05: qty 100, 33d supply, fill #1
  Filled 2023-02-04: qty 100, 33d supply, fill #2

## 2022-08-06 MED ORDER — MICROLET LANCETS MISC
3 refills | Status: DC
  Filled 2022-08-06: qty 100, 30d supply, fill #0
  Filled 2022-08-16: qty 100, 33d supply, fill #0
  Filled 2023-02-04: qty 100, 33d supply, fill #1

## 2022-08-06 MED ORDER — CONTOUR NEXT EZ W/DEVICE KIT
PACK | 0 refills | Status: DC
  Filled 2022-08-06 – 2022-08-16 (×2): qty 1, 30d supply, fill #0

## 2022-08-06 NOTE — Progress Notes (Signed)
S:     No chief complaint on file.  57 y.o. male who presents for diabetes evaluation, education, and management.  PMH is significant for T2DM w/ ESRD (MWF HD), anemia of chronic disease, resistant HTN, OSA, HLD, obesity, epilepsy.  Patient was referred and last seen by Freeman Caldron on 05/16/2022. A1c at that visit  13.4% (up from 11.5% previously). BP was 179/96 mmHg. He was found to be non-adherent with medications.   Today, patient arrives in good spirits and presents without any assistance.  Mr. Godek tells me today that his blood pressure is dropping at dialysis. Gives readings in the low 100s/60s. On non-dialysis days, he does not check his blood pressure at home. Has not taken any blood pressure medications today. Regarding his diabetes, he has not taken any insulin today. Ran out of strips ~1 week ago so has not been checking at home. However, he is taking his Semglee consistently.   Family/Social History:  -Fhx: DM, heart disease, seizure disorder, stroke -Tobacco: never smoker  -Alcohol: none reported   Current diabetes medications include: Semglee 30u daily Current hypertension medications include: amlodipine 5 mg daily,carvedilol 3.125 mg BID, enalapril 5 mg daily,  Insurance coverage: BCBS  Patient denies hypoglycemic events.  Reported home fasting blood sugars: not checking consistently. Gives range in the 200s. Rarely will take after meals: 190s-200s.   Patient denies nocturia (nighttime urination).  Patient reports neuropathy (nerve pain). Patient denies visual changes. Patient reports self foot exams.   Patient reported dietary habits:  -Non-adherent to sodium restriction. "Still in that salt game" -Drinks regular Pepsi and [place]. Dew daily.  -Does not limit carbohydrates but tries to avoid sweets. -Meals: does not eat at regular intervals.   Patient-reported exercise habits: none   O:   ROS  Physical Exam  7 day average blood glucose: no meter  with him. No CGM in place.  Lab Results  Component Value Date   HGBA1C 13.4 (A) 05/16/2022   Vitals:   08/06/22 0922  BP: (!) 147/84    Lipid Panel     Component Value Date/Time   CHOL 261 (H) 02/04/2022 1129   TRIG 222 (H) 02/04/2022 1129   HDL 54 02/04/2022 1129   CHOLHDL 4.8 02/04/2022 1129   CHOLHDL 3.1 02/09/2016 1045   VLDL 11 02/09/2016 1045   LDLCALC 166 (H) 02/04/2022 1129   Clinical Atherosclerotic Cardiovascular Disease (ASCVD): No  The 10-year ASCVD risk score (Arnett DK, et al., 2019) is: 26.3%   Values used to calculate the score:     Age: 57 years     Sex: Male     Is Non-Hispanic African American: Yes     Diabetic: Yes     Tobacco smoker: No     Systolic Blood Pressure: 580 mmHg     Is BP treated: Yes     HDL Cholesterol: 54 mg/dL     Total Cholesterol: 261 mg/dL   A/P: Diabetes longstanding currently uncontrolled. Patient is able to verbalize appropriate hypoglycemia management plan. Medication adherence appears suboptimal.   There are a lot of changes I wish to make for him to simplify things. The problem is medication cost and adherence in general. We will need to make small changes over time. Will continue to have him fill with our pharmacy. I have provided him with a Lantus rx. He will also benefit from CGM. This is covered art a $0 copay and I have sent this rx downstairs.  -Increase Semglee to  Lantus and increase to 40u daily. Patient may increase to 50u once daily after 1 week if blood sugar at home continues to be >200.   Mike [facility] supplies sent.  -Patient educated on purpose, proper use, and potential adverse effects of Lantus.  -Extensively discussed pathophysiology of diabetes, recommended lifestyle interventions, dietary effects on blood sugar control.  -Counseled on s/sx of and management of hypoglycemia.  -Next A1c anticipated 07/2022.   Hypertension longstanding currently above goal. Blood pressure goal of <130/80 mmHg. Medication adherence  is optimal. He must omit medication on dialysis days d/t hypotension hence no changes today. Consider midodrine in the future if BP continues to drop at dialysis.  -Continue amlodipine 5 mg daily.  -Continue carvedilol 3.125 mg BID. -Continue enalapril 5 mg daily.   Written patient instructions provided. Patient verbalized understanding of treatment plan.  Total time in face to face counseling 45 minutes.    Follow-up:  Pharmacist in 1 month. PCP clinic visit at the end of Jan.   Benard Halsted, PharmD, [facility] [phone]

## 2022-08-07 DIAGNOSIS — R52 Pain, unspecified: Secondary | ICD-10-CM | POA: Diagnosis not present

## 2022-08-07 DIAGNOSIS — D509 Iron deficiency anemia, unspecified: Secondary | ICD-10-CM | POA: Diagnosis not present

## 2022-08-07 DIAGNOSIS — Z992 Dependence on renal dialysis: Secondary | ICD-10-CM | POA: Diagnosis not present

## 2022-08-07 DIAGNOSIS — E1129 Type 2 diabetes mellitus with other diabetic kidney complication: Secondary | ICD-10-CM | POA: Diagnosis not present

## 2022-08-07 DIAGNOSIS — D688 Other specified coagulation defects: Secondary | ICD-10-CM | POA: Diagnosis not present

## 2022-08-07 DIAGNOSIS — N2581 Secondary hyperparathyroidism of renal origin: Secondary | ICD-10-CM | POA: Diagnosis not present

## 2022-08-07 DIAGNOSIS — N186 End stage renal disease: Secondary | ICD-10-CM | POA: Diagnosis not present

## 2022-08-09 DIAGNOSIS — N186 End stage renal disease: Secondary | ICD-10-CM | POA: Diagnosis not present

## 2022-08-09 DIAGNOSIS — D688 Other specified coagulation defects: Secondary | ICD-10-CM | POA: Diagnosis not present

## 2022-08-09 DIAGNOSIS — Z992 Dependence on renal dialysis: Secondary | ICD-10-CM | POA: Diagnosis not present

## 2022-08-09 DIAGNOSIS — E1129 Type 2 diabetes mellitus with other diabetic kidney complication: Secondary | ICD-10-CM | POA: Diagnosis not present

## 2022-08-09 DIAGNOSIS — D509 Iron deficiency anemia, unspecified: Secondary | ICD-10-CM | POA: Diagnosis not present

## 2022-08-09 DIAGNOSIS — R52 Pain, unspecified: Secondary | ICD-10-CM | POA: Diagnosis not present

## 2022-08-09 DIAGNOSIS — N2581 Secondary hyperparathyroidism of renal origin: Secondary | ICD-10-CM | POA: Diagnosis not present

## 2022-08-12 DIAGNOSIS — R52 Pain, unspecified: Secondary | ICD-10-CM | POA: Diagnosis not present

## 2022-08-12 DIAGNOSIS — D688 Other specified coagulation defects: Secondary | ICD-10-CM | POA: Diagnosis not present

## 2022-08-12 DIAGNOSIS — E1129 Type 2 diabetes mellitus with other diabetic kidney complication: Secondary | ICD-10-CM | POA: Diagnosis not present

## 2022-08-12 DIAGNOSIS — Z992 Dependence on renal dialysis: Secondary | ICD-10-CM | POA: Diagnosis not present

## 2022-08-12 DIAGNOSIS — N186 End stage renal disease: Secondary | ICD-10-CM | POA: Diagnosis not present

## 2022-08-12 DIAGNOSIS — N2581 Secondary hyperparathyroidism of renal origin: Secondary | ICD-10-CM | POA: Diagnosis not present

## 2022-08-12 DIAGNOSIS — D509 Iron deficiency anemia, unspecified: Secondary | ICD-10-CM | POA: Diagnosis not present

## 2022-08-13 ENCOUNTER — Other Ambulatory Visit: Payer: Self-pay

## 2022-08-13 ENCOUNTER — Encounter (HOSPITAL_COMMUNITY): Payer: Self-pay | Admitting: Vascular Surgery

## 2022-08-13 ENCOUNTER — Ambulatory Visit: Payer: Medicare Other | Admitting: Podiatry

## 2022-08-13 NOTE — Progress Notes (Signed)
[name] denies chest pain or shortness of breath.  Patient denies having any s/s of Covid in his household, also denies any known exposure to Covid.   [name] states that he is lightheadedness at times, especially during dialysis. [name], PA-C instructed patient to not take blood pressure medications the day of surgery.  [name] states that he feels lightheadedness on non dialysis, patient is going to talk to the Dr. To see if he can stop some of the medications. [name] states that he is not going to take blood pressure/ heart medications tomorrow.  I encouraged [name] to take Coreg- beta blocker.  [name] has type ii diabetes. Patient has not been able to check CBG, because he does not have strips and can not afford them.  [name], [place]- C has made arrangements for patient to get some tomorrow.  I instructed [name] to take 20 units of Lantus insulin in am.

## 2022-08-13 NOTE — Anesthesia Preprocedure Evaluation (Signed)
Anesthesia Evaluation  Patient identified by MRN, date of birth, ID band Patient awake    Reviewed: Allergy & Precautions, H&P , NPO status , Patient's Chart, lab work & pertinent test results, reviewed documented beta blocker date and time   Airway Mallampati: III  TM Distance: >3 FB Neck ROM: Full    Dental no notable dental hx. (+) Edentulous Upper, Dental Advisory Given, Loose,    Pulmonary sleep apnea and Continuous Positive Airway Pressure Ventilation    Pulmonary exam normal breath sounds clear to auscultation       Cardiovascular hypertension, Pt. on medications and Pt. on home beta blockers + Peripheral Vascular Disease   Rhythm:Regular Rate:Normal     Neuro/Psych  Headaches  negative psych ROS   GI/Hepatic negative GI ROS, Neg liver ROS,,,  Endo/Other  diabetes, Insulin Dependent  Morbid obesity  Renal/GU ESRF and DialysisRenal disease  negative genitourinary   Musculoskeletal   Abdominal   Peds  Hematology  (+) Blood dyscrasia, anemia   Anesthesia Other Findings   Reproductive/Obstetrics negative OB ROS                             Anesthesia Physical Anesthesia Plan  ASA: 3  Anesthesia Plan: General   Post-op Pain Management: Tylenol PO (pre-op)*   Induction: Intravenous  PONV Risk Score and Plan: 3 and Midazolam, Ondansetron and Dexamethasone  Airway Management Planned: LMA  Additional Equipment:   Intra-op Plan:   Post-operative Plan: Extubation in OR  Informed Consent: I have reviewed the patients History and Physical, chart, labs and discussed the procedure including the risks, benefits and alternatives for the proposed anesthesia with the patient or authorized representative who has indicated his/her understanding and acceptance.     Dental advisory given  Plan Discussed with: CRNA  Anesthesia Plan Comments: (PAT note written 08/13/2022 by Myra Gianotti,  PA-C.  )       Anesthesia Quick Evaluation

## 2022-08-13 NOTE — Progress Notes (Signed)
Anesthesia Chart Review: Jeremy [name]  Case: [id number] Date/Time: 08/14/22 1216   Procedure: LEFT ARM ARTERIOVENOUS (AV) FISTULA CREATION (Left)   Anesthesia type: Choice   Pre-op diagnosis: End Stage Renal Disease   Location: [facility] OR ROOM 11 / [place] [facility]   Surgeons: [name], MD       DISCUSSION: Patient is a 57 year old male scheduled for the above procedure. S/p left radiocephalic AVF 3/84/53 which required revision but more recently was poorly functioning with hemodialysis. S/p fistulogram on 07/25/22 which showed the left cephalic vein occludes in the antecubitum with no outflow in the upper arm. A tunneled dialysis catheter [facility]) and new HD access recommended. He agreed to [facility] which was placed in the right IJ on 07/25/22, but he wanted to delay new permanent HD access to the following week.   History includes never smoker, ESRD (HD started 08/12/19, MWF [facility] [address] HTN, Bell's Palsy, DM2, hypercholesterolemia, seizure/pseudoseizure in the setting of hyperglycemia/hyperosmolar non-ketotic syndrome (08/04/17; normal EEG 07/2017), exertional dyspnea, obesity.   Historically DM ha been poorly controlled. He had pharmacist evaluation with Hope Pigeon, PharmD, CPP on 06/25/22 given worsening DM control (A1c 13.4% on 05/16/22, up from 11.5% 02/04/22) and medication non-adherence, at least partially due to costs and high co-pays. Also non-compliant with diabetic dietary restrictions. 75/25 insulin discontinued and started on Semglee (glargine) 30 units daily. Also held Humalog SSI. Repeat A1c planned  ~ January 2024. He does not currently check home CBGs--he is awaiting new strips. Current regimen per medication list includes Lantus 40 units daily and Humalog 6 units with meals.   He is not followed routinely by cardiology.  Echo on 08/05/17 (in the setting of hyperosmolar non-ketotic syndrome and hypertensive urgency admission) showed mild focal basal hypertrophy of the septum, mildly reduced  LV systolic function with EF 45-50%, diffuse hypokinesis, normal RV systolic function. Primary care and nephrology did not recommend additional testing during that admission. He was later referred to cardiologist Dr. Angelena Form for HTN and seen on 09/20/21. He preferred nephrology to manage HTN given ESRD. No additional cardiac testing ordered with as needed cardiology follow-up.  Anesthesia team to evaluate on the day of surgery. He will get labs include glucose on arrival.     VS:  BP Readings from Last 3 Encounters:  08/06/22 (!) 147/84  07/25/22 (!) 162/97  07/23/22 (!) 182/101   Pulse Readings from Last 3 Encounters:  07/25/22 64  07/23/22 71  06/25/22 77     PROVIDERS: Gildardo Pounds, NP is PCP  Lauree Chandler, MD is cardiologist. Seen as new patient for HTN on 09/20/21. BP reasonable while a dialysis. He preferred HTN be managed with nephrology given ESRD. As needed cardiology follow-up recommended.  Harrie Jeans, MD is nephrologist   LABS: For day of surgery.   IMAGES: 1V PCXR 07/25/22: IMPRESSION: 1. Right internal jugular central venous catheter terminates over the right atrium. No pneumothorax. 2. Low lung volumes with mild streaky bibasilar atelectasis. 3. Mild cardiomegaly without overt pulmonary edema.   EKG: 09/20/21 ([facility]): SR. RBBB.   CV: Echo 08/05/17 (in the setting of hyperosmolar non-ketotic syndrome/seizure/hypertensive urgency) Study Conclusions  - Left ventricle: The cavity size was normal. There was mild focal basal hypertrophy of the septum. Systolic function was mildly reduced. The estimated ejection fraction was in the range of 45% to 50%. Diffuse hypokinesis. Indeterminant diastolic function.  - Aortic valve: There was no stenosis.  - Mitral valve: There was no significant regurgitation.  -  Right ventricle: The cavity size was normal. Systolic function was normal.  - Pulmonary arteries: No complete TR doppler jet so unable to estimate  PA systolic pressure.  - Inferior vena cava: The vessel was normal in size. The respirophasic diameter changes were in the normal range (>= 50%), consistent with normal central venous pressure.  Impressions:  - Technically difficult study with poor acoustic windows. Normal LV size with mild focal basal septal hypertrophy. EF 45-50%, diffuse mild hypokinesis. Normal RV size and systolic function.  (Echo results noted by nephrology and [facility]. No chest pain. No additional testing ordered at that time, although both mentioned could consider further evaluation in the future. [facility] until 08/2019 and no additional cardiac testing testing or referrals ordered. He saw cardiologist Dr. Angelena Form for HTN on 09/20/21. No additional testing ordered with as needed cardiology follow-up.)    Past Medical History:  Diagnosis Date   Abscess    AKI (acute kidney injury) ([place]) 08/05/2017   Bell's palsy    right eye abn since dx bell's palsy   CKD (chronic kidney disease)    Dr. McDiarmind    Dyspnea    occasional with exertion   Headache    High cholesterol    Hypertension    Left shoulder pain 10/26/2013   S/p injection on 10/26/13    Renal insufficiency 02/14/2014   Seizures ([place]) 08/04/2017   "related to high blood sugars" (08/05/2017)   Type II diabetes mellitus ([place])     Past Surgical History:  Procedure Laterality Date   A/V FISTULAGRAM Left 07/25/2022   Procedure: A/V Fistulagram;  Surgeon: [name], MD;  Location: [place] CV LAB;  Service: Cardiovascular;  Laterality: Left;   AV FISTULA PLACEMENT Left 11/11/2019   Procedure: LEFT FOREARM RADIOCEPHALIC ARTERIOVENOUS (AV) FISTULA CREATION;  Surgeon: [name], MD;  Location: [facility] OR;  Service: Vascular;  Laterality: Left;   Eyelid surgery Right    I & D EXTREMITY  11/11/2011   Procedure: IRRIGATION AND DEBRIDEMENT EXTREMITY;  Surgeon: Merrie Roof, MD;  Location: [place];  Service: General;  Laterality: Right;  I&D Right Thigh Abscess   INSERTION OF DIALYSIS CATHETER N/A 07/25/2022   Procedure: INSERTION OF DIALYSIS CATHETER;  Surgeon: [name], MD;  Location: [place];  Service: Vascular;  Laterality: N/A;   IR FLUORO GUIDE CV LINE RIGHT  08/12/2019   IR US GUIDE VASC ACCESS RIGHT  08/12/2019   REVISON OF ARTERIOVENOUS FISTULA Left 03/23/2020   Procedure: LEFT ARM ARTERIOVENOUS FISTULA REVISON, LIGATION OF SIDE BRANCHES AND ELEVATION;  Surgeon: [name], MD;  Location: [facility] OR;  Service: Vascular;  Laterality: Left;    MEDICATIONS:  0.9 %  sodium chloride infusion   sodium chloride flush (NS) 0.9 % injection 3 mL    amLODipine (NORVASC) 5 MG tablet   aspirin EC (ASPIRIN LOW DOSE) 81 MG tablet   atorvastatin (LIPITOR) 40 MG tablet   Blood Glucose Monitoring Suppl (CONTOUR NEXT EZ) w/Device KIT   carvedilol (COREG) 3.125 MG tablet   cinacalcet (SENSIPAR) 30 MG tablet   Continuous Blood Gluc Receiver (FREESTYLE LIBRE 2 READER) DEVI   Continuous Blood Gluc Sensor (FREESTYLE LIBRE 2 SENSOR) MISC   cyclobenzaprine (FLEXERIL) 10 MG tablet   enalapril (VASOTEC) 5 MG tablet   famotidine (PEPCID) 10 MG tablet   fluticasone (FLONASE) 50 MCG/ACT nasal spray   FOSRENOL 1000 MG  PACK   glucose blood (CONTOUR NEXT TEST) test strip   HYDROcodone-acetaminophen (NORCO) 5-325 MG tablet   HYDROcodone-acetaminophen (NORCO/VICODIN) 5-325 MG tablet   insulin glargine (LANTUS SOLOSTAR) 100 UNIT/ML Solostar Pen   insulin lispro (HUMALOG KWIKPEN) 100 UNIT/ML KwikPen   Insulin Pen Needle (B-D ULTRAFINE III SHORT PEN) 31G X 8 MM MISC   Lancet Devices (MICROLET NEXT LANCING DEVICE) MISC   Lanthanum Carbonate (FOSRENOL) 1000 MG PACK   levETIRAcetam (KEPPRA XR) 500 MG 24 hr tablet   Microlet Lancets MISC   omeprazole (PRILOSEC) 20 MG capsule   topiramate (TOPAMAX) 50 MG tablet   traZODone (DESYREL) 150 MG tablet   VELPHORO 500 MG  chewable tablet    Myra Gianotti, PA-C Surgical Short Stay/Anesthesiology [facility] Phone [phone] [facility] Phone [phone] 08/13/2022 4:28 PM

## 2022-08-14 ENCOUNTER — Ambulatory Visit (HOSPITAL_BASED_OUTPATIENT_CLINIC_OR_DEPARTMENT_OTHER): Payer: Medicare Other | Admitting: Vascular Surgery

## 2022-08-14 ENCOUNTER — Other Ambulatory Visit: Payer: Self-pay

## 2022-08-14 ENCOUNTER — Ambulatory Visit (HOSPITAL_COMMUNITY): Payer: Medicare Other | Admitting: Vascular Surgery

## 2022-08-14 ENCOUNTER — Encounter (HOSPITAL_COMMUNITY): Payer: Self-pay | Admitting: Vascular Surgery

## 2022-08-14 ENCOUNTER — Encounter (HOSPITAL_COMMUNITY)
Admission: RE | Disposition: A | Discharge status: Home / Self Care | Payer: Self-pay | Source: Home / Self Care | Attending: Vascular Surgery

## 2022-08-14 ENCOUNTER — Ambulatory Visit (HOSPITAL_COMMUNITY)
Admission: RE | Admit: 2022-08-14 | Discharge: 2022-08-14 | Disposition: A | Discharge status: Home / Self Care | Payer: Medicare Other | Attending: Vascular Surgery | Admitting: Vascular Surgery

## 2022-08-14 DIAGNOSIS — I120 Hypertensive chronic kidney disease with stage 5 chronic kidney disease or end stage renal disease: Secondary | ICD-10-CM | POA: Diagnosis not present

## 2022-08-14 DIAGNOSIS — Z992 Dependence on renal dialysis: Secondary | ICD-10-CM

## 2022-08-14 DIAGNOSIS — N186 End stage renal disease: Secondary | ICD-10-CM

## 2022-08-14 DIAGNOSIS — E7800 Pure hypercholesterolemia, unspecified: Secondary | ICD-10-CM | POA: Diagnosis not present

## 2022-08-14 DIAGNOSIS — Z794 Long term (current) use of insulin: Secondary | ICD-10-CM | POA: Insufficient documentation

## 2022-08-14 DIAGNOSIS — E1151 Type 2 diabetes mellitus with diabetic peripheral angiopathy without gangrene: Secondary | ICD-10-CM | POA: Diagnosis not present

## 2022-08-14 DIAGNOSIS — Z6841 Body Mass Index (BMI) 40.0 and over, adult: Secondary | ICD-10-CM | POA: Diagnosis not present

## 2022-08-14 DIAGNOSIS — G4730 Sleep apnea, unspecified: Secondary | ICD-10-CM | POA: Diagnosis not present

## 2022-08-14 DIAGNOSIS — N185 Chronic kidney disease, stage 5: Secondary | ICD-10-CM | POA: Diagnosis not present

## 2022-08-14 DIAGNOSIS — Z79899 Other long term (current) drug therapy: Secondary | ICD-10-CM | POA: Insufficient documentation

## 2022-08-14 DIAGNOSIS — D631 Anemia in chronic kidney disease: Secondary | ICD-10-CM

## 2022-08-14 DIAGNOSIS — E1122 Type 2 diabetes mellitus with diabetic chronic kidney disease: Secondary | ICD-10-CM

## 2022-08-14 HISTORY — DX: End stage renal disease: N18.6

## 2022-08-14 HISTORY — PX: LIGATION OF ARTERIOVENOUS  FISTULA: SHX5948

## 2022-08-14 HISTORY — PX: AV FISTULA PLACEMENT: SHX1204

## 2022-08-14 LAB — GLUCOSE, CAPILLARY
Glucose-Capillary: 204 mg/dL — ABNORMAL HIGH (ref 70–99)
Glucose-Capillary: 176 mg/dL — ABNORMAL HIGH (ref 70–99)
Glucose-Capillary: 176 mg/dL — ABNORMAL HIGH (ref 70–99)
Glucose-Capillary: 122 mg/dL — ABNORMAL HIGH (ref 70–99)

## 2022-08-14 LAB — POCT I-STAT, CHEM 8
BUN: 64 mg/dL — ABNORMAL HIGH (ref 6–20)
Calcium, Ion: 1.15 mmol/L (ref 1.15–1.40)
Chloride: 101 mmol/L (ref 98–111)
Creatinine, Ser: 12.1 mg/dL — ABNORMAL HIGH (ref 0.61–1.24)
Glucose, Bld: 199 mg/dL — ABNORMAL HIGH (ref 70–99)
HCT: 40 % (ref 39.0–52.0)
Hemoglobin: 13.6 g/dL (ref 13.0–17.0)
Potassium: 5.2 mmol/L — ABNORMAL HIGH (ref 3.5–5.1)
Sodium: 137 mmol/L (ref 135–145)
TCO2: 25 mmol/L (ref 22–32)

## 2022-08-14 SURGERY — ARTERIOVENOUS (AV) FISTULA CREATION
Anesthesia: General | Site: Arm Upper | Laterality: Left

## 2022-08-14 MED ORDER — ACETAMINOPHEN 500 MG PO TABS
1000.0000 mg | ORAL_TABLET | Freq: Once | ORAL | Status: AC
  Administered 2022-08-14: 1000 mg via ORAL
  Filled 2022-08-14: qty 2

## 2022-08-14 MED ORDER — HYDROMORPHONE HCL 1 MG/ML IJ SOLN
0.2500 mg | INTRAMUSCULAR | Status: DC | PRN
  Administered 2022-08-14 (×3): 0.5 mg via INTRAVENOUS

## 2022-08-14 MED ORDER — CHLORHEXIDINE GLUCONATE 4 % EX LIQD: 60.0000 mL | Freq: Once | CUTANEOUS | Status: DC

## 2022-08-14 MED ORDER — FENTANYL CITRATE (PF) 250 MCG/5ML IJ SOLN
INTRAMUSCULAR | Status: AC
  Filled 2022-08-14: qty 5

## 2022-08-14 MED ORDER — HYDROCODONE-ACETAMINOPHEN 5-325 MG PO TABS: 1.0000 | ORAL_TABLET | Freq: Four times a day (QID) | ORAL | 0 refills | Status: DC | PRN

## 2022-08-14 MED ORDER — LIDOCAINE 2% (20 MG/ML) 5 ML SYRINGE
INTRAMUSCULAR | Status: DC | PRN
  Administered 2022-08-14: 100 mg via INTRAVENOUS

## 2022-08-14 MED ORDER — SODIUM CHLORIDE 0.9 % IV SOLN: INTRAVENOUS | Status: DC

## 2022-08-14 MED ORDER — FENTANYL CITRATE (PF) 250 MCG/5ML IJ SOLN
INTRAMUSCULAR | Status: DC | PRN
  Administered 2022-08-14: 50 ug via INTRAVENOUS
  Administered 2022-08-14: 25 ug via INTRAVENOUS
  Administered 2022-08-14: 50 ug via INTRAVENOUS

## 2022-08-14 MED ORDER — LIDOCAINE HCL 1 % IJ SOLN
INTRAMUSCULAR | Status: DC | PRN
  Administered 2022-08-14: 2 mL

## 2022-08-14 MED ORDER — LIDOCAINE-EPINEPHRINE (PF) 1 %-1:200000 IJ SOLN
INTRAMUSCULAR | Status: AC
  Filled 2022-08-14: qty 30

## 2022-08-14 MED ORDER — HEPARIN SODIUM (PORCINE) 1000 UNIT/ML IJ SOLN
INTRAMUSCULAR | Status: AC
  Filled 2022-08-14: qty 10

## 2022-08-14 MED ORDER — HYDROMORPHONE HCL 1 MG/ML IJ SOLN
INTRAMUSCULAR | Status: AC
  Filled 2022-08-14: qty 1

## 2022-08-14 MED ORDER — STERILE WATER FOR IRRIGATION IR SOLN
Status: DC | PRN
  Administered 2022-08-14: 1000 mL

## 2022-08-14 MED ORDER — INSULIN ASPART 100 UNIT/ML IJ SOLN
0.0000 [IU] | INTRAMUSCULAR | Status: DC | PRN
  Filled 2022-08-14: qty 1

## 2022-08-14 MED ORDER — HEPARIN SODIUM (PORCINE) 1000 UNIT/ML IJ SOLN
INTRAMUSCULAR | Status: DC | PRN
  Administered 2022-08-14: 5000 [IU] via INTRAVENOUS

## 2022-08-14 MED ORDER — MIDAZOLAM HCL 2 MG/2ML IJ SOLN
INTRAMUSCULAR | Status: DC | PRN
  Administered 2022-08-14: 2 mg via INTRAVENOUS

## 2022-08-14 MED ORDER — HEPARIN 6000 UNIT IRRIGATION SOLUTION
Status: DC | PRN
  Administered 2022-08-14: 1

## 2022-08-14 MED ORDER — PROPOFOL 10 MG/ML IV BOLUS
INTRAVENOUS | Status: AC
  Filled 2022-08-14: qty 20

## 2022-08-14 MED ORDER — HEPARIN 6000 UNIT IRRIGATION SOLUTION
Status: AC
  Filled 2022-08-14: qty 500

## 2022-08-14 MED ORDER — 0.9 % SODIUM CHLORIDE (POUR BTL) OPTIME
TOPICAL | Status: DC | PRN
  Administered 2022-08-14: 1000 mL

## 2022-08-14 MED ORDER — PROPOFOL 10 MG/ML IV BOLUS
INTRAVENOUS | Status: DC | PRN
  Administered 2022-08-14: 200 mg via INTRAVENOUS
  Administered 2022-08-14: 20 mg via INTRAVENOUS

## 2022-08-14 MED ORDER — CHLORHEXIDINE GLUCONATE 0.12 % MT SOLN
OROMUCOSAL | Status: AC
  Administered 2022-08-14: 15 mL
  Filled 2022-08-14: qty 15

## 2022-08-14 MED ORDER — CEFAZOLIN IN SODIUM CHLORIDE 3-0.9 GM/100ML-% IV SOLN
3.0000 g | INTRAVENOUS | Status: AC
  Administered 2022-08-14: 3 g via INTRAVENOUS
  Filled 2022-08-14: qty 100

## 2022-08-14 SURGICAL SUPPLY — 29 items
ARMBAND PINK RESTRICT EXTREMIT (MISCELLANEOUS) ×4 IMPLANT
BAG COUNTER SPONGE SURGICOUNT (BAG) ×2 IMPLANT
BLADE CLIPPER SURG (BLADE) ×2 IMPLANT
CANISTER SUCT 3000ML PPV (MISCELLANEOUS) ×2 IMPLANT
CLIP VESOCCLUDE MED 6/CT (CLIP) ×2 IMPLANT
CLIP VESOCCLUDE SM WIDE 6/CT (CLIP) ×2 IMPLANT
COVER PROBE W GEL 5X96 (DRAPES) ×2 IMPLANT
DERMABOND ADVANCED .7 DNX12 (GAUZE/BANDAGES/DRESSINGS) ×2 IMPLANT
ELECT REM PT RETURN 9FT ADLT (ELECTROSURGICAL) ×2
ELECTRODE REM PT RTRN 9FT ADLT (ELECTROSURGICAL) ×2 IMPLANT
GLOVE BIO SURGEON STRL SZ7.5 (GLOVE) ×2 IMPLANT
GLOVE BIOGEL PI IND STRL 8 (GLOVE) ×2 IMPLANT
GOWN STRL REUS W/ TWL LRG LVL3 (GOWN DISPOSABLE) ×4 IMPLANT
GOWN STRL REUS W/ TWL XL LVL3 (GOWN DISPOSABLE) ×4 IMPLANT
GOWN STRL REUS W/TWL LRG LVL3 (GOWN DISPOSABLE) ×4
GOWN STRL REUS W/TWL XL LVL3 (GOWN DISPOSABLE) ×4
HEMOSTAT SPONGE AVITENE ULTRA (HEMOSTASIS) IMPLANT
KIT BASIN OR (CUSTOM PROCEDURE TRAY) ×2 IMPLANT
KIT TURNOVER KIT B (KITS) ×2 IMPLANT
NS IRRIG 1000ML POUR BTL (IV SOLUTION) ×2 IMPLANT
PACK CV ACCESS (CUSTOM PROCEDURE TRAY) ×2 IMPLANT
PAD ARMBOARD 7.5X6 YLW CONV (MISCELLANEOUS) ×4 IMPLANT
SUT MNCRL AB 4-0 PS2 18 (SUTURE) ×2 IMPLANT
SUT PROLENE 6 0 BV (SUTURE) ×2 IMPLANT
SUT VIC AB 3-0 SH 27 (SUTURE) ×2
SUT VIC AB 3-0 SH 27X BRD (SUTURE) ×2 IMPLANT
TOWEL GREEN STERILE (TOWEL DISPOSABLE) ×2 IMPLANT
UNDERPAD 30X36 HEAVY ABSORB (UNDERPADS AND DIAPERS) ×2 IMPLANT
WATER STERILE IRR 1000ML POUR (IV SOLUTION) ×2 IMPLANT

## 2022-08-14 NOTE — Anesthesia Postprocedure Evaluation (Signed)
Anesthesia Post Note  Patient: Jeremy [name].  Procedure(s) Performed: FIRST STAGE BRACHIOBASILIC LEFT ARM ARTERIOVENOUS (AV) FISTULA CREATION (Left: Arm Upper) LIGATION OF BRACHIOCEPHALIC ARTERIOVENOUS  FISTULA (Left: Arm Lower)     Patient location during evaluation: PACU Anesthesia Type: General Level of consciousness: awake and alert Pain management: pain level controlled Vital Signs Assessment: post-procedure vital signs reviewed and stable Respiratory status: spontaneous breathing, nonlabored ventilation and respiratory function stable Cardiovascular status: blood pressure returned to baseline and stable Postop Assessment: no apparent nausea or vomiting Anesthetic complications: no  No notable events documented.  Last Vitals:  Vitals:   08/14/22 1500 08/14/22 1515  BP: (!) 153/90 (!) 141/90  Pulse: 65 67  Resp: 12 11  Temp:  36.5 C  SpO2: 91% 93%    Last Pain:  Vitals:   08/14/22 1515  TempSrc:   PainSc: Asleep                 Clive Parcel,W. EDMOND

## 2022-08-14 NOTE — Op Note (Signed)
OPERATIVE NOTE   PROCEDURE: Ligation left radiocephalic arteriovenous fistula Left first stage basilic vein transposition (brachiobasilic arteriovenous fistula) placement  PRE-OPERATIVE DIAGNOSIS: End stage renal disese  POST-OPERATIVE DIAGNOSIS: Same  SURGEON: [name], MD  ASSISTANT(S): Paulo Fruit, PA  ANESTHESIA: LMA  ESTIMATED BLOOD LOSS: <25 mL  FINDING(S): The left radiocephalic arteriovenous fistula was ligated at the wrist given the cephalic vein is occluded in the upper arm with no outflow and this fistula remains nonfunctional (it is draining through collaterals and not working in dialysis).  A new first stage brachiobasilic fistula was made at the left antecubital fossa.  Excellent thrill at completion.  Palpable radial pulse.  SPECIMEN(S):  none  INDICATIONS:   Jeremy [name]. is a 57 y.o. male who presents with end stage renal disease.  The patient is scheduled for ligation of his left arm AV fistula that is not working and placement of a new fistula in the left upper arm.  The patient is aware the risks include but are not limited to: bleeding, infection, steal syndrome, nerve damage, ischemic monomelic neuropathy, failure to mature, and need for additional procedures.  The patient is aware of the risks of the procedure and elects to proceed forward.   DESCRIPTION: After full informed written consent was obtained from the patient, the patient was brought back to the operating room and placed supine upon the operating table.  Prior to induction, the patient received IV antibiotics.   After obtaining adequate anesthesia, the patient was then prepped and draped in the standard fashion for a left arm access procedure.   I initially turned my attention to the radiocephalic fistula at the wrist.  A small longitudinal incision was made over the cephalic vein adjacent to the radial artery at the wrist.  I dissected down with Bovie cautery and the cephalic  vein was dissected out and this was controlled with a right angle and I ligated the cephalic vein with multiple 2-0 silk ties.  There was no longer a pulsatile thrill in the fistula.  There was a palpable radial pulse.   I then turned my attention first to identifying the patient's basilic vein and brachial artery.   Using SonoSite guidance, the location of these vessels were marked out on the skin at the antecubtial fossa where the vein was good caliber.   I made a transverse incision at the level of the antecubitum and dissected through the subcutaneous tissue and fascia to gain exposure of the brachial artery.  This was noted to be 5 mm in diameter externally.  This was dissected out proximally and distally and controlled with vessel loops .  I then dissected out the basilic vein.  This was noted to be 3 mm in diameter externally.  The distal segment of the vein was ligated with a  2-0 silk, and the vein was transected.  The proximal segment was interrogated with serial dilators.  The vein accepted up to a 5 mm dilator without any difficulty.  I then instilled the heparinized saline into the vein and clamped it.  At this point, I reset my exposure of the brachial artery.  The patient was given 5,000 units IV heparin.  I then placed the artery under tension proximally and distally.  I made an arteriotomy with a #11 blade, and then I extended the arteriotomy with a Potts scissor.  I injected heparinized saline proximal and distal to this arteriotomy.  The vein was then sewn to the artery in  an end-to-side configuration with a running stitch of 6-0 Prolene.  Prior to completing this anastomosis, I allowed the vein and artery to backbleed.  There was no evidence of clot from any vessels.  I completed the anastomosis in the usual fashion and then released all vessel loops and clamps.    There was a palpable thrill in the venous outflow, and there was a palpable radial pulse.  At this point, I irrigated out the  surgical wound.  There was no further active bleeding.  The subcutaneous tissue was reapproximated with a running stitch of 3-0 Vicryl.  The skin was then reapproximated with a running subcuticular stitch of 4-0 Monocryl and both incisions were closed.  The skin was then cleaned, dried, and reinforced with Dermabond.  The patient tolerated this procedure well.   COMPLICATIONS: None  CONDITION: Stable  [name] MD [facility] Office: [place]   08/14/2022, 2:43 PM

## 2022-08-14 NOTE — Anesthesia Procedure Notes (Signed)
Procedure Name: LMA Insertion Date/Time: 08/14/2022 1:00 PM  Performed by: Alain Marion, CRNAPre-anesthesia Checklist: Patient identified, Emergency Drugs available, Suction available and Patient being monitored Patient Re-evaluated:Patient Re-evaluated prior to induction Oxygen Delivery Method: Circle System Utilized Preoxygenation: Pre-oxygenation with 100% oxygen Induction Type: IV induction LMA: LMA inserted LMA Size: 5.0 Number of attempts: 1 Airway Equipment and Method: Bite block Placement Confirmation: positive ETCO2 Tube secured with: Tape Dental Injury: Teeth and Oropharynx as per pre-operative assessment

## 2022-08-14 NOTE — Transfer of Care (Signed)
Immediate Anesthesia Transfer of Care Note  Patient: Jeremy [name].  Procedure(s) Performed: FIRST STAGE BRACHIOBASILIC LEFT ARM ARTERIOVENOUS (AV) FISTULA CREATION (Left: Arm Upper) LIGATION OF BRACHIOCEPHALIC ARTERIOVENOUS  FISTULA (Left: Arm Lower)  Patient Location: PACU  Anesthesia Type:General  Level of Consciousness: awake, alert , and oriented  Airway & Oxygen Therapy: Patient Spontanous Breathing and Patient connected to face mask oxygen  Post-op Assessment: Report given to RN and Post -op Vital signs reviewed and stable  Post vital signs: Reviewed and stable  Last Vitals:  Vitals Value Taken Time  BP 163/92 08/14/22 1437  Temp    Pulse 70 08/14/22 1440  Resp 18 08/14/22 1440  SpO2 96 % 08/14/22 1440  Vitals shown include unvalidated device data.  Last Pain:  Vitals:   08/14/22 1021  TempSrc:   PainSc: 0-No pain         Complications: No notable events documented.

## 2022-08-14 NOTE — Discharge Instructions (Signed)
[facility]  Discharge Instructions  AV Fistula or Graft Surgery for Dialysis Access  Please refer to the following instructions for your post-procedure care. Your surgeon or physician assistant will discuss any changes with you.  Activity  You may drive the day following your surgery, if you are comfortable and no longer taking prescription pain medication. Resume full activity as the soreness in your incision resolves.  Bathing/Showering  You may shower after you go home. Keep your incision dry for 48 hours. Do not soak in a bathtub, hot tub, or swim until the incision heals completely. You may not shower if you have a hemodialysis catheter.  Incision Care  Clean your incision with mild soap and water after 48 hours. Pat the area dry with a clean towel. You do not need a bandage unless otherwise instructed. Do not apply any ointments or creams to your incision. You may have skin glue on your incision. Do not peel it off. It will come off on its own in about one week. Your arm may swell a bit after surgery. To reduce swelling use pillows to elevate your arm so it is above your heart. Your doctor will tell you if you need to lightly wrap your arm with an ACE bandage.  Diet  Resume your normal diet. There are not special food restrictions following this procedure. In order to heal from your surgery, it is CRITICAL to get adequate nutrition. Your body requires vitamins, minerals, and protein. Vegetables are the best source of vitamins and minerals. Vegetables also provide the perfect balance of protein. Processed food has little nutritional value, so try to avoid this.  Medications  Resume taking all of your medications. If your incision is causing pain, you may take over-the counter pain relievers such as acetaminophen (Tylenol). If you were prescribed a stronger pain medication, please be aware these medications can cause nausea and constipation. Prevent  nausea by taking the medication with a snack or meal. Avoid constipation by drinking plenty of fluids and eating foods with high amount of fiber, such as fruits, vegetables, and grains.  Do not take Tylenol if you are taking prescription pain medications.  Follow up Your surgeon may want to see you in the office following your access surgery. If so, this will be arranged at the time of your surgery.  Please call us immediately for any of the following conditions:  Increased pain, redness, drainage (pus) from your incision site Fever of 101 degrees or higher Severe or worsening pain at your incision site Hand pain or numbness.  Reduce your risk of vascular disease:  Stop smoking. If you would like help, call [facility] at 1-800-QUIT-NOW 989-855-1206) or [place] at [place] your cholesterol Maintain a desired weight Control your diabetes Keep your blood pressure down  Dialysis  It will take several weeks to several months for your new dialysis access to be ready for use. Your surgeon will determine when it is okay to use it. Your nephrologist will continue to direct your dialysis. You can continue to use your Permcath until your new access is ready for use.   08/14/2022 Jeremy [name]. [id number] 11-11-1965  Surgeon(s): [name], MD  Procedure(s): FIRST STAGE BRACHIOBASILIC LEFT ARM ARTERIOVENOUS (AV) FISTULA CREATION LIGATION OF BRACHIOCEPHALIC ARTERIOVENOUS  FISTULA   May stick graft immediately   May stick graft on designated area only:   X Do not stick Left AV fistula for 12 weeks  If you have any questions, please call the office at [phone].

## 2022-08-14 NOTE — H&P (Signed)
History and Physical Interval Note:  08/14/2022 12:14 PM  Jeremy [name].  has presented today for surgery, with the diagnosis of End Stage Renal Disease.  The various methods of treatment have been discussed with the patient and family. After consideration of risks, benefits and other options for treatment, the patient has consented to  Procedure(s): LEFT ARM ARTERIOVENOUS (AV) FISTULA CREATION (Left) as a surgical intervention.  The patient's history has been reviewed, patient examined, no change in status, stable for surgery.  I have reviewed the patient's chart and labs.  Questions were answered to the patient's satisfaction.    Discussed ligating his left radiocephalic AV fistula at the wrist since the upper arm cephalic vein is occluded with only collateral outflow as demonstrated on recent fistulogram and not working.  Currently using TDC.  Will make a new left upper arm AV fistula with the basilic vein versus using AV graft.  Discussed if basilic vein likely will be in 2 stages.  [name]     Patient name: Jeremy [name].       MRN: [id number]        DOB: Feb 25, 1966          Sex: male   REASON FOR CONSULT: Evaluate lesions over left arm AV fistula   HPI: Jeremy [name]. is a 57 y.o. male, with end-stage renal disease on hemodialysis Monday Wednesday Friday that presents for evaluation of lesions over left arm AV fistula.  Patient previously had a left radiocephalic AV fistula on 01/15/349 that had to be subsequently revised with sidebranch ligation.  He is referred by Dr. Reeves Dam with [facility].  He states he has had no bleeding events.  States they are having issues using the fistula and having to reposition the needle multiple times with the machine alarming.          Past Medical History:  Diagnosis Date   Abscess     AKI (acute kidney injury) ([place]) 08/05/2017   Bell's palsy      right eye abn since dx bell's palsy   CKD (chronic kidney disease)      Dr.  McDiarmind    Dyspnea      occasional with exertion   Headache     High cholesterol     Hypertension     Left shoulder pain 10/26/2013    S/p injection on 10/26/13    Renal insufficiency 02/14/2014   Seizures ([place]) 08/04/2017    "related to high blood sugars" (08/05/2017)   Type II diabetes mellitus ([place])                 Past Surgical History:  Procedure Laterality Date   AV FISTULA PLACEMENT Left 11/11/2019    Procedure: LEFT FOREARM RADIOCEPHALIC ARTERIOVENOUS (AV) FISTULA CREATION;  Surgeon: [name], MD;  Location: [facility];  Service: Vascular;  Laterality: Left;   Eyelid surgery Right     I & D EXTREMITY   11/11/2011    Procedure: IRRIGATION AND DEBRIDEMENT EXTREMITY;  Surgeon: Merrie Roof, MD;  Location: [facility];  Service: General;  Laterality: Right;  I&D Right Thigh Abscess   IR FLUORO GUIDE CV LINE RIGHT   08/12/2019   IR US GUIDE VASC ACCESS RIGHT   08/12/2019   REVISON OF ARTERIOVENOUS FISTULA Left 03/23/2020    Procedure: LEFT ARM ARTERIOVENOUS FISTULA REVISON, LIGATION OF SIDE BRANCHES AND ELEVATION;  Surgeon: [name], MD;  Location: [facility];  Service: Vascular;  Laterality: Left;               Family History  Problem Relation Age of Onset   Diabetes Mother     Heart disease Mother 55   Diabetes Sister     Diabetes Brother     Diabetes Brother     Cancer Maternal Uncle          type unknown   Seizures Niece     Stroke Neg Hx        SOCIAL HISTORY: Social History             Socioeconomic History   Marital status: Divorced      Spouse name: Not on file   Number of children: 3   Years of education: Not on file   Highest education level: Not on file  Occupational History   Occupation: maintenance tech   Occupation: Disablity  Tobacco Use   Smoking status: Never   Smokeless tobacco: Never  Vaping Use   Vaping Use: Never used  Substance and Sexual Activity   Alcohol use: Not Currently   Drug use: No   Sexual activity: Yes  Other Topics  Concern   Not on file  Social History Narrative    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  Did not graduate high school.  Has 3 adult children in [place].  Divorced.  Lives with girlfriend [name].                              Social Determinants of Health           Financial Resource Strain: Not on file  Food Insecurity: Not on file  Transportation Needs: Not on file  Physical Activity: Unknown (08/31/2019)    Exercise Vital Sign     Days of Exercise per Week: 2 days     Minutes of Exercise per Session: Not on file  Stress: Not on file  Social Connections: Not on file  Intimate Partner Violence: Not on file               Allergies  Allergen Reactions   Amlodipine Other (See Comments)      Dizziness                 Current Outpatient Medications  Medication Sig Dispense Refill   amLODipine (NORVASC) 5 MG tablet Take 1 tablet (5 mg total) by mouth daily. 90 tablet 1   aspirin EC (ASPIRIN LOW DOSE) 81 MG tablet Take 1 tablet (81 mg total) by mouth daily. 90 tablet 0   atorvastatin (LIPITOR) 40 MG tablet Take 1 tablet (40 mg total) by mouth daily. 90 tablet 3   Blood Glucose Monitoring Suppl (CONTOUR NEXT MONITOR) w/Device KIT 1 Device by Does not apply route 4 (four) times daily - after meals and at bedtime. 1 kit 0   carvedilol (COREG) 3.125 MG tablet Take 1 tablet (3.125 mg total) by mouth 2 (two) times daily with a meal. 180 tablet 0   cinacalcet (SENSIPAR) 30 MG tablet Take by mouth.       cyclobenzaprine (FLEXERIL) 10 MG tablet Take 1 tablet (10 mg total) by mouth 3 (three) times daily as needed for muscle spasms. 30 tablet 0   enalapril (VASOTEC) 5 MG tablet Take 1 tablet (5 mg total) by mouth daily. 90 tablet 1   fluticasone (FLONASE) 50 MCG/ACT nasal spray Place 1 spray into  both nostrils daily. 16 g 0   FOSRENOL 1000 MG PACK Take 1 packet by mouth 3 (three) times daily.       glucose blood (TRUE METRIX BLOOD GLUCOSE TEST) test  strip Use as instructed. Check blood glucose level by fingerstick 3 times per day. 200 each 12   HYDROcodone-acetaminophen (NORCO/VICODIN) 5-325 MG tablet Take 1 tablet by mouth every 6 (six) hours as needed for moderate pain. 12 tablet 0   insulin glargine-yfgn (SEMGLEE) 100 UNIT/ML Pen Inject 30 Units into the skin daily. 15 mL 1   insulin lispro (HUMALOG KWIKPEN) 100 UNIT/ML KwikPen Inject 6 units prior to all meals. 1 hr after meals inject according to scale. For blood sugars 0-150 give 0 units of insulin, 151-200 give 2 units of insulin, 201-250 give 4 units, 251-300 give 6 units, 301-350 give 8 units, 351-400 give 10 units,> 400 give 12 units 15 mL 11   Insulin Pen Needle (B-D ULTRAFINE III SHORT PEN) 31G X 8 MM MISC 1 application  by Does not apply route 3 (three) times daily. E11.65 200 each 11   Lancet Devices (MICROLET NEXT LANCING DEVICE) MISC 1 Device by Does not apply route 3 (three) times daily. 1 each 11   Lanthanum Carbonate (FOSRENOL) 1000 MG PACK Take by mouth.       levETIRAcetam (KEPPRA XR) 500 MG 24 hr tablet Take 2 tablets (1,000 mg total) by mouth daily. 180 tablet 1   Microlet Lancets MISC 1 Device by Does not apply route 3 (three) times daily. E11.65 200 each 11   omeprazole (PRILOSEC) 20 MG capsule Take 1 capsule (20 mg total) by mouth daily. 30 capsule 3   topiramate (TOPAMAX) 50 MG tablet Take by mouth.       VELPHORO 500 MG chewable tablet Chew 500 mg by mouth 3 (three) times daily.       famotidine (PEPCID) 10 MG tablet Take 1 tablet (10 mg total) by mouth 2 (two) times daily. 60 tablet 1   traZODone (DESYREL) 150 MG tablet Take 1 tablet (150 mg total) by mouth at bedtime. 90 tablet 2    No current facility-administered medications for this visit.      REVIEW OF SYSTEMS:  '[X]'$  denotes positive finding, '[ ]'$  denotes negative finding Cardiac   Comments:  Chest pain or chest pressure:      Shortness of breath upon exertion:      Short of breath when lying flat:       Irregular heart rhythm:             Vascular      Pain in calf, thigh, or hip brought on by ambulation:      Pain in feet at night that wakes you up from your sleep:       Blood clot in your veins:      Leg swelling:              Pulmonary      Oxygen at home:      Productive cough:       Wheezing:              Neurologic      Sudden weakness in arms or legs:       Sudden numbness in arms or legs:       Sudden onset of difficulty speaking or slurred speech:      Temporary loss of vision in one eye:       Problems  with dizziness:              Gastrointestinal      Blood in stool:       Vomited blood:              Genitourinary      Burning when urinating:       Blood in urine:             Psychiatric      Major depression:              Hematologic      Bleeding problems:      Problems with blood clotting too easily:             Skin      Rashes or ulcers:             Constitutional      Fever or chills:          PHYSICAL EXAM:      Vitals:    07/23/22 0921  BP: (!) 182/101  Pulse: 71  Temp: 97.6 F (36.4 C)  TempSrc: Temporal  SpO2: 98%  Weight: (!) 310 lb (140.6 kg)  Height: '5\' 8"'$  (1.727 m)      GENERAL: The patient is a well-nourished male, in no acute distress. The vital signs are documented above. CARDIAC: There is a regular rate and rhythm.  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 MUSCULOSKELETAL: There are no major deformities or cyanosis. NEUROLOGIC: No focal weakness or paresthesias are detected. SKIN: There are no ulcers or rashes noted. PSYCHIATRIC: The patient has a normal affect.      DATA:    None   Assessment/Plan:   57 year old male with ESRD that presents for evaluation of lesions over his left arm radiocephalic AV fistula.  Discussed that he has 2 tandem aneurysms as pictured above.  The skin  appears intact with no open ulcers.  I do not feel these need revision at this time.  Discussed indication for revision are open ulcerations or active bleeding events with thinning skin.  I think we can continue to monitor his fistula.  Unfortunately he is has having some issues using the fistula.  It does feel pulsatile to me.  I have recommended a left arm fistulogram.  We will get this scheduled for Thursday in the Cath Lab.  Risk benefits discussed.     [name], MD [facility] Office: [phone]

## 2022-08-15 ENCOUNTER — Encounter (HOSPITAL_COMMUNITY): Payer: Self-pay | Admitting: Vascular Surgery

## 2022-08-15 DIAGNOSIS — D509 Iron deficiency anemia, unspecified: Secondary | ICD-10-CM | POA: Diagnosis not present

## 2022-08-15 DIAGNOSIS — N186 End stage renal disease: Secondary | ICD-10-CM | POA: Diagnosis not present

## 2022-08-15 DIAGNOSIS — N2581 Secondary hyperparathyroidism of renal origin: Secondary | ICD-10-CM | POA: Diagnosis not present

## 2022-08-15 DIAGNOSIS — D688 Other specified coagulation defects: Secondary | ICD-10-CM | POA: Diagnosis not present

## 2022-08-15 DIAGNOSIS — E1129 Type 2 diabetes mellitus with other diabetic kidney complication: Secondary | ICD-10-CM | POA: Diagnosis not present

## 2022-08-15 DIAGNOSIS — Z992 Dependence on renal dialysis: Secondary | ICD-10-CM | POA: Diagnosis not present

## 2022-08-15 DIAGNOSIS — R52 Pain, unspecified: Secondary | ICD-10-CM | POA: Diagnosis not present

## 2022-08-16 ENCOUNTER — Other Ambulatory Visit: Payer: Self-pay

## 2022-08-16 DIAGNOSIS — E1129 Type 2 diabetes mellitus with other diabetic kidney complication: Secondary | ICD-10-CM | POA: Diagnosis not present

## 2022-08-16 DIAGNOSIS — Z992 Dependence on renal dialysis: Secondary | ICD-10-CM | POA: Diagnosis not present

## 2022-08-16 DIAGNOSIS — N186 End stage renal disease: Secondary | ICD-10-CM | POA: Diagnosis not present

## 2022-08-16 DIAGNOSIS — D509 Iron deficiency anemia, unspecified: Secondary | ICD-10-CM | POA: Diagnosis not present

## 2022-08-16 DIAGNOSIS — N2581 Secondary hyperparathyroidism of renal origin: Secondary | ICD-10-CM | POA: Diagnosis not present

## 2022-08-16 DIAGNOSIS — R52 Pain, unspecified: Secondary | ICD-10-CM | POA: Diagnosis not present

## 2022-08-16 DIAGNOSIS — D688 Other specified coagulation defects: Secondary | ICD-10-CM | POA: Diagnosis not present

## 2022-08-19 ENCOUNTER — Telehealth: Payer: Self-pay | Admitting: Emergency Medicine

## 2022-08-19 DIAGNOSIS — D509 Iron deficiency anemia, unspecified: Secondary | ICD-10-CM | POA: Diagnosis not present

## 2022-08-19 DIAGNOSIS — R52 Pain, unspecified: Secondary | ICD-10-CM | POA: Diagnosis not present

## 2022-08-19 DIAGNOSIS — N2581 Secondary hyperparathyroidism of renal origin: Secondary | ICD-10-CM | POA: Diagnosis not present

## 2022-08-19 DIAGNOSIS — N186 End stage renal disease: Secondary | ICD-10-CM | POA: Diagnosis not present

## 2022-08-19 DIAGNOSIS — Z992 Dependence on renal dialysis: Secondary | ICD-10-CM | POA: Diagnosis not present

## 2022-08-19 DIAGNOSIS — E1129 Type 2 diabetes mellitus with other diabetic kidney complication: Secondary | ICD-10-CM | POA: Diagnosis not present

## 2022-08-19 DIAGNOSIS — D688 Other specified coagulation defects: Secondary | ICD-10-CM | POA: Diagnosis not present

## 2022-08-19 NOTE — Telephone Encounter (Signed)
Patient's insurance is now preferring Lantus in place of Semglee. He was unsure if he should take the Lantus. I let him know that it is fine to take and should be taken at the same dose that I had prescribed of the [facility].

## 2022-08-19 NOTE — Telephone Encounter (Signed)
Copied from [place] [id number]. Topic: General - Inquiry >> Aug 19, 2022 10:39 AM Penni Bombard wrote: Reason for CRM: pt called asking Lurena Joiner to give him a call regarding his insulin.  CB#  [phone]

## 2022-08-20 ENCOUNTER — Ambulatory Visit: Payer: Medicare Other | Attending: Nurse Practitioner | Admitting: Nurse Practitioner

## 2022-08-20 ENCOUNTER — Encounter: Payer: Self-pay | Admitting: Nurse Practitioner

## 2022-08-20 ENCOUNTER — Other Ambulatory Visit: Payer: Self-pay

## 2022-08-20 VITALS — BP 144/78 | HR 83 | Ht 68.0 in | Wt 301.0 lb

## 2022-08-20 DIAGNOSIS — N2581 Secondary hyperparathyroidism of renal origin: Secondary | ICD-10-CM | POA: Diagnosis not present

## 2022-08-20 DIAGNOSIS — E1151 Type 2 diabetes mellitus with diabetic peripheral angiopathy without gangrene: Secondary | ICD-10-CM | POA: Diagnosis not present

## 2022-08-20 DIAGNOSIS — R569 Unspecified convulsions: Secondary | ICD-10-CM

## 2022-08-20 DIAGNOSIS — Z6841 Body Mass Index (BMI) 40.0 and over, adult: Secondary | ICD-10-CM | POA: Diagnosis not present

## 2022-08-20 DIAGNOSIS — I120 Hypertensive chronic kidney disease with stage 5 chronic kidney disease or end stage renal disease: Secondary | ICD-10-CM | POA: Insufficient documentation

## 2022-08-20 DIAGNOSIS — Z87898 Personal history of other specified conditions: Secondary | ICD-10-CM

## 2022-08-20 DIAGNOSIS — Z23 Encounter for immunization: Secondary | ICD-10-CM | POA: Diagnosis not present

## 2022-08-20 DIAGNOSIS — D688 Other specified coagulation defects: Secondary | ICD-10-CM | POA: Diagnosis not present

## 2022-08-20 DIAGNOSIS — Z713 Dietary counseling and surveillance: Secondary | ICD-10-CM | POA: Insufficient documentation

## 2022-08-20 DIAGNOSIS — E1122 Type 2 diabetes mellitus with diabetic chronic kidney disease: Secondary | ICD-10-CM | POA: Diagnosis not present

## 2022-08-20 DIAGNOSIS — Z1211 Encounter for screening for malignant neoplasm of colon: Secondary | ICD-10-CM

## 2022-08-20 DIAGNOSIS — Z794 Long term (current) use of insulin: Secondary | ICD-10-CM | POA: Insufficient documentation

## 2022-08-20 DIAGNOSIS — R972 Elevated prostate specific antigen [PSA]: Secondary | ICD-10-CM

## 2022-08-20 DIAGNOSIS — D352 Benign neoplasm of pituitary gland: Secondary | ICD-10-CM

## 2022-08-20 DIAGNOSIS — M79605 Pain in left leg: Secondary | ICD-10-CM

## 2022-08-20 LAB — POCT GLYCOSYLATED HEMOGLOBIN (HGB A1C): Hemoglobin A1C: 11.9 % — AB (ref 4.0–5.6)

## 2022-08-20 LAB — GLUCOSE, POCT (MANUAL RESULT ENTRY): POC Glucose: 259 mg/dl — AB (ref 70–99)

## 2022-08-20 MED ORDER — DICLOFENAC SODIUM 1 % EX GEL: 4.0000 g | Freq: Four times a day (QID) | CUTANEOUS | 1 refills | Status: DC

## 2022-08-20 MED ORDER — INSULIN LISPRO (1 UNIT DIAL) 100 UNIT/ML (KWIKPEN)
6.0000 [IU] | PEN_INJECTOR | Freq: Three times a day (TID) | SUBCUTANEOUS | 11 refills | Status: DC
  Filled 2022-08-20: qty 15, 84d supply, fill #0

## 2022-08-20 MED ORDER — DICLOFENAC SODIUM 1 % EX GEL
4.0000 g | Freq: Four times a day (QID) | CUTANEOUS | 1 refills | Status: DC
  Filled 2022-08-20: qty 200, 13d supply, fill #0

## 2022-08-20 MED ORDER — LANTUS SOLOSTAR 100 UNIT/ML ~~LOC~~ SOPN: PEN_INJECTOR | SUBCUTANEOUS | 2 refills | Status: DC

## 2022-08-20 MED ORDER — LEVETIRACETAM ER 500 MG PO TB24
500.0000 mg | ORAL_TABLET | Freq: Two times a day (BID) | ORAL | 1 refills | Status: DC
  Filled 2022-08-20: qty 180, 90d supply, fill #0

## 2022-08-20 NOTE — Progress Notes (Signed)
Assessment & Plan:  Jeremy [name] was seen today for hypertension and diabetes.  Diagnoses and all orders for this visit:  Type 2 diabetes mellitus with diabetic peripheral angiopathy without gangrene, with long-term current use of insulin (HCC) -     POCT glucose (manual entry) -     POCT glycosylated hemoglobin (Hb A1C) -     insulin lispro (HUMALOG KWIKPEN) 100 UNIT/ML KwikPen; Inject 6 Units into the skin 3 (three) times daily with meals. -     Thyroid Panel With TSH -     insulin glargine (LANTUS SOLOSTAR) 100 UNIT/ML Solostar Pen; Inject 40 Units into the skin daily. After 1 week, may increase to 50 units daily if blood sugar at home is  above 200.  Morbid obesity with BMI of 45.0-49.9, adult (HCC) -     Thyroid Panel With TSH  Seizure ([place]) -     Thyroid Panel With TSH  Secondary hyperparathyroidism ([place])  Other specified coagulation defects ([place])  Benign neoplasm of pituitary gland (HCC)  Left leg pain -     Discontinue: diclofenac Sodium (VOLTAREN) 1 % GEL; Apply 4 g topically 4 (four) times daily. -     diclofenac Sodium (VOLTAREN) 1 % GEL; Apply 4 g topically 4 (four) times daily.  History of seizure -     levETIRAcetam (KEPPRA XR) 500 MG 24 hr tablet; Take 1 tablet (500 mg total) by mouth in the morning and at bedtime.  Need for shingles vaccine -     Varicella-zoster vaccine IM    Patient has been counseled on age-appropriate routine health concerns for screening and prevention. These are reviewed and up-to-date. Referrals have been placed accordingly. Immunizations are up-to-date or declined.    Subjective:   Chief Complaint  Patient presents with   Hypertension   Diabetes   HPI Jeremy [name]. 57 y.o. male presents to office today for follow up to DM and HTN  He has a past medical history of Abscess,  Bell's palsy, CKD Dyspnea, Headache, High cholesterol, Hypertension, Left shoulder pain (10/26/2013), Renal insufficiency (02/14/2014), Seizures ([place])  (08/04/2017), and Type II diabetes mellitus, ESRD on HD [facility].   He has not been taking all of his medications as prescribed. Diabetes is not controlled and Blood pressure continues elevated.     DM 2 Not at goal. He has not been administering humalog as prescribed. Lab Results  Component Value Date   HGBA1C 11.9 (A) 08/20/2022    Lab Results  Component Value Date   HGBA1C 13.4 (A) 05/16/2022  LDL not at goal with atorvastatin 40 mg daily.  Lab Results  Component Value Date   LDLCALC 166 (H) 02/04/2022    HTN Not taking amlodipine. States it caused him to experience dizziness. He has not picked up his carvedilol from the pharmacy. BP Readings from Last 3 Encounters:  08/20/22 (!) 144/78  08/14/22 (!) 141/90  08/06/22 (!) 147/84     Joint Pain Endorses Intermittent pain in left side of leg  described as sharp stabbing pins. Worse with weight bearing.   Migraine Headaches Endorses morning headaches which have been difficult to treat. Associated symptoms nighttime awakenings, 3-4 hours of sleep at a time, snoring. Needs Sleep study.  He has been nonadherent with his CPAP so difficult to ascertain if his current CPAP settings and equipment are even beneficial at this time. He does state when he initially used it that it did provide relief of his symptoms of OSA. Current risk  factors: Morbid Obesity, snoring, excessive daytime fatigue, witnessed spells of apneic episodes, HTN (poorly controlled)  Review of Systems  Constitutional:  Negative for fever, malaise/fatigue and weight loss.  HENT: Negative.  Negative for nosebleeds.   Eyes: Negative.  Negative for blurred vision, double vision and photophobia.  Respiratory: Negative.  Negative for cough and shortness of breath.   Cardiovascular: Negative.  Negative for chest pain, palpitations and leg swelling.  Gastrointestinal: Negative.  Negative for heartburn, nausea and vomiting.  Musculoskeletal: Negative.  Negative for myalgias.   Neurological: Negative.  Negative for dizziness, focal weakness, seizures and headaches.  Psychiatric/Behavioral: Negative.  Negative for suicidal ideas.     Past Medical History:  Diagnosis Date   Abscess    AKI (acute kidney injury) ([place]) 08/05/2017   Bell's palsy    right eye abn since dx bell's palsy   CKD (chronic kidney disease)    Dr. McDiarmind    Dyspnea    occasional with exertion   ESRD (end stage renal disease) ([place])    MWF -[facility]   Headache    High cholesterol    Hypertension    Left shoulder pain 10/26/2013   S/p injection on 10/26/13    Renal insufficiency 02/14/2014   Seizures ([place]) 08/04/2017   "related to high blood sugars" (08/05/2017)   Type II diabetes mellitus [facility])     Past Surgical History:  Procedure Laterality Date   A/V FISTULAGRAM Left 07/25/2022   Procedure: A/V Fistulagram;  Surgeon: [name], MD;  Location: [facility];  Service: Cardiovascular;  Laterality: Left;   AV FISTULA PLACEMENT Left 11/11/2019   Procedure: LEFT FOREARM RADIOCEPHALIC ARTERIOVENOUS (AV) FISTULA CREATION;  Surgeon: [name], MD;  Location: [facility];  Service: Vascular;  Laterality: Left;   AV FISTULA PLACEMENT Left 08/14/2022   Procedure: FIRST STAGE BRACHIOBASILIC LEFT ARM ARTERIOVENOUS (AV) FISTULA CREATION;  Surgeon: [name], MD;  Location: [facility];  Service: Vascular;  Laterality: Left;   Eyelid surgery Right    I & D EXTREMITY  11/11/2011   Procedure: IRRIGATION AND DEBRIDEMENT EXTREMITY;  Surgeon: Merrie Roof, MD;  Location: [facility];  Service: General;  Laterality: Right;  I&D Right Thigh Abscess   INSERTION OF DIALYSIS CATHETER N/A 07/25/2022   Procedure: INSERTION OF DIALYSIS CATHETER;  Surgeon: [name], MD;  Location: [facility];  Service: Vascular;  Laterality: N/A;   IR FLUORO GUIDE CV LINE RIGHT  08/12/2019   IR US GUIDE VASC ACCESS RIGHT  08/12/2019   LIGATION OF ARTERIOVENOUS  FISTULA Left 08/14/2022   Procedure:  LIGATION OF BRACHIOCEPHALIC ARTERIOVENOUS  FISTULA;  Surgeon: [name], MD;  Location: [facility];  Service: Vascular;  Laterality: Left;   REVISON OF ARTERIOVENOUS FISTULA Left 03/23/2020   Procedure: LEFT ARM ARTERIOVENOUS FISTULA REVISON, LIGATION OF SIDE BRANCHES AND ELEVATION;  Surgeon: [name], MD;  [facility];  Service: Vascular;  Laterality: Left;    Family History  Problem Relation Age of Onset   Diabetes Mother    Heart disease Mother 38   Diabetes Sister    Diabetes Brother    Diabetes Brother    Cancer Maternal Uncle        type unknown   Seizures Niece    Stroke Neg Hx     Social History Reviewed with no changes to be made today.   Outpatient Medications Prior to Visit  Medication Sig Dispense Refill   aspirin EC (ASPIRIN LOW  DOSE) 81 MG tablet Take 1 tablet (81 mg total) by mouth daily. 90 tablet 0   Blood Glucose Monitoring Suppl (CONTOUR NEXT EZ) w/Device KIT Use to check blood sugar three times daily. 1 kit 0   carvedilol (COREG) 3.125 MG tablet Take 1 tablet (3.125 mg total) by mouth 2 (two) times daily with a meal. 180 tablet 0   Continuous Blood Gluc Receiver (FREESTYLE LIBRE 2 READER) DEVI Use to check blood sugar three times daily. 1 each 0   Continuous Blood Gluc Sensor (FREESTYLE LIBRE 2 SENSOR) MISC Use to check blood sugar three times daily. 2 each 3   enalapril (VASOTEC) 5 MG tablet Take 1 tablet (5 mg total) by mouth daily. 90 tablet 1   glucose blood (CONTOUR NEXT TEST) test strip Use to check blood sugar three times daily. 100 each 12   HYDROcodone-acetaminophen (NORCO) 5-325 MG tablet Take 1 tablet by mouth every 6 (six) hours as needed for moderate pain. 12 tablet 0   Insulin Pen Needle (B-D ULTRAFINE III SHORT PEN) 31G X 8 MM MISC 1 application  by Does not apply route 3 (three) times daily. E11.65 200 each 11   Lancet Devices (MICROLET NEXT LANCING DEVICE) MISC 1 Device by Does not apply route 3 (three) times daily. 1 each 11    Microlet Lancets MISC Use to check blood sugar three times daily. E11.65 100 each 3   VELPHORO 500 MG chewable tablet Chew 500 mg by mouth 3 (three) times daily with meals.     insulin glargine (LANTUS SOLOSTAR) 100 UNIT/ML Solostar Pen Inject 40 Units into the skin daily. After 1 week, may increase to 50 units daily if blood sugar at home is  above 200. (Patient taking differently: 40 Units daily.) 30 mL 2   levETIRAcetam (KEPPRA XR) 500 MG 24 hr tablet Take 2 tablets (1,000 mg total) by mouth daily. (Patient taking differently: Take 500 mg by mouth in the morning and at bedtime.) 180 tablet 1   amLODipine (NORVASC) 5 MG tablet Take 1 tablet (5 mg total) by mouth daily. (Patient not taking: Reported on 08/14/2022) 90 tablet 1   atorvastatin (LIPITOR) 40 MG tablet Take 1 tablet (40 mg total) by mouth daily. (Patient not taking: Reported on 08/20/2022) 90 tablet 3   insulin lispro (HUMALOG KWIKPEN) 100 UNIT/ML KwikPen Inject 6 units prior to all meals. 1 hr after meals inject according to scale. For blood sugars 0-150 give 0 units of insulin, 151-200 give 2 units of insulin, 201-250 give 4 units, 251-300 give 6 units, 301-350 give 8 units, 351-400 give 10 units,> 400 give 12 units (Patient not taking: Reported on 08/20/2022) 15 mL 11   Facility-Administered Medications Prior to Visit  Medication Dose Route Frequency Provider Last Rate Last Admin   0.9 %  sodium chloride infusion  250 mL Intravenous PRN [name], MD       sodium chloride flush (NS) 0.9 % injection 3 mL  3 mL Intravenous Q12H [name], MD        Allergies  Allergen Reactions   Amlodipine Other (See Comments)    Dizziness       Objective:    BP (!) 144/78   Pulse 83   Ht '5\' 8"'$  (1.727 m)   Wt (!) 301 lb (136.5 kg)   SpO2 99%   BMI 45.77 kg/m  Wt Readings from Last 3 Encounters:  08/20/22 (!) 301 lb (136.5 kg)  08/14/22 (!) 300 lb 12.8  oz (136.4 kg)  07/25/22 (!) 310 lb (140.6 kg)    Physical  Exam Vitals and nursing note reviewed.  Constitutional:      Appearance: He is well-developed.  HENT:     Head: Normocephalic and atraumatic.  Cardiovascular:     Rate and Rhythm: Normal rate and regular rhythm.     Heart sounds: Normal heart sounds. No murmur heard.    No friction rub. No gallop.  Pulmonary:     Effort: Pulmonary effort is normal. No tachypnea or respiratory distress.     Breath sounds: Normal breath sounds. No decreased breath sounds, wheezing, rhonchi or rales.  Chest:     Chest wall: No tenderness.  Abdominal:     General: Bowel sounds are normal.     Palpations: Abdomen is soft.  Musculoskeletal:        General: Normal range of motion.     Cervical back: Normal range of motion.  Skin:    General: Skin is warm and dry.  Neurological:     Mental Status: He is alert and oriented to person, place, and time.     Coordination: Coordination normal.  Psychiatric:        Behavior: Behavior normal. Behavior is cooperative.        Thought Content: Thought content normal.        Judgment: Judgment normal.          Patient has been counseled extensively about nutrition and exercise as well as the importance of adherence with medications and regular follow-up. The patient was given clear instructions to go to ER or return to medical center if symptoms don't improve, worsen or new problems develop. The patient verbalized understanding.   Follow-up: Return in about 3 months (around 11/19/2022).   Gildardo Pounds, FNP-BC [facility] [place], [place]   08/28/2022, 10:52 PM

## 2022-08-21 DIAGNOSIS — E1129 Type 2 diabetes mellitus with other diabetic kidney complication: Secondary | ICD-10-CM | POA: Diagnosis not present

## 2022-08-21 DIAGNOSIS — Z992 Dependence on renal dialysis: Secondary | ICD-10-CM | POA: Diagnosis not present

## 2022-08-21 DIAGNOSIS — D688 Other specified coagulation defects: Secondary | ICD-10-CM | POA: Diagnosis not present

## 2022-08-21 DIAGNOSIS — R52 Pain, unspecified: Secondary | ICD-10-CM | POA: Diagnosis not present

## 2022-08-21 DIAGNOSIS — D509 Iron deficiency anemia, unspecified: Secondary | ICD-10-CM | POA: Diagnosis not present

## 2022-08-21 DIAGNOSIS — I129 Hypertensive chronic kidney disease with stage 1 through stage 4 chronic kidney disease, or unspecified chronic kidney disease: Secondary | ICD-10-CM | POA: Diagnosis not present

## 2022-08-21 DIAGNOSIS — N186 End stage renal disease: Secondary | ICD-10-CM | POA: Diagnosis not present

## 2022-08-21 DIAGNOSIS — N2581 Secondary hyperparathyroidism of renal origin: Secondary | ICD-10-CM | POA: Diagnosis not present

## 2022-08-21 LAB — THYROID PANEL WITH TSH
Free Thyroxine Index: 2.7 (ref 1.2–4.9)
T3 Uptake Ratio: 29 % (ref 24–39)
T4, Total: 9.2 ug/dL (ref 4.5–12.0)
TSH: 1.61 u[IU]/mL (ref 0.450–4.500)

## 2022-08-23 DIAGNOSIS — D688 Other specified coagulation defects: Secondary | ICD-10-CM | POA: Diagnosis not present

## 2022-08-23 DIAGNOSIS — N2581 Secondary hyperparathyroidism of renal origin: Secondary | ICD-10-CM | POA: Diagnosis not present

## 2022-08-23 DIAGNOSIS — Z992 Dependence on renal dialysis: Secondary | ICD-10-CM | POA: Diagnosis not present

## 2022-08-23 DIAGNOSIS — N186 End stage renal disease: Secondary | ICD-10-CM | POA: Diagnosis not present

## 2022-08-23 DIAGNOSIS — D509 Iron deficiency anemia, unspecified: Secondary | ICD-10-CM | POA: Diagnosis not present

## 2022-08-23 DIAGNOSIS — E1129 Type 2 diabetes mellitus with other diabetic kidney complication: Secondary | ICD-10-CM | POA: Diagnosis not present

## 2022-08-26 DIAGNOSIS — D509 Iron deficiency anemia, unspecified: Secondary | ICD-10-CM | POA: Diagnosis not present

## 2022-08-26 DIAGNOSIS — D688 Other specified coagulation defects: Secondary | ICD-10-CM | POA: Diagnosis not present

## 2022-08-26 DIAGNOSIS — N2581 Secondary hyperparathyroidism of renal origin: Secondary | ICD-10-CM | POA: Diagnosis not present

## 2022-08-26 DIAGNOSIS — N186 End stage renal disease: Secondary | ICD-10-CM | POA: Diagnosis not present

## 2022-08-26 DIAGNOSIS — E1129 Type 2 diabetes mellitus with other diabetic kidney complication: Secondary | ICD-10-CM | POA: Diagnosis not present

## 2022-08-26 DIAGNOSIS — Z992 Dependence on renal dialysis: Secondary | ICD-10-CM | POA: Diagnosis not present

## 2022-08-27 ENCOUNTER — Telehealth: Payer: Self-pay

## 2022-08-27 ENCOUNTER — Encounter: Payer: Self-pay | Admitting: Podiatry

## 2022-08-27 ENCOUNTER — Ambulatory Visit (INDEPENDENT_AMBULATORY_CARE_PROVIDER_SITE_OTHER): Payer: Medicare Other | Admitting: Podiatry

## 2022-08-27 DIAGNOSIS — M2141 Flat foot [pes planus] (acquired), right foot: Secondary | ICD-10-CM

## 2022-08-27 DIAGNOSIS — M2142 Flat foot [pes planus] (acquired), left foot: Secondary | ICD-10-CM

## 2022-08-27 DIAGNOSIS — Z794 Long term (current) use of insulin: Secondary | ICD-10-CM

## 2022-08-27 DIAGNOSIS — B351 Tinea unguium: Secondary | ICD-10-CM

## 2022-08-27 DIAGNOSIS — N186 End stage renal disease: Secondary | ICD-10-CM | POA: Diagnosis not present

## 2022-08-27 DIAGNOSIS — E119 Type 2 diabetes mellitus without complications: Secondary | ICD-10-CM

## 2022-08-27 DIAGNOSIS — M79674 Pain in right toe(s): Secondary | ICD-10-CM

## 2022-08-27 DIAGNOSIS — Z992 Dependence on renal dialysis: Secondary | ICD-10-CM

## 2022-08-27 DIAGNOSIS — M79675 Pain in left toe(s): Secondary | ICD-10-CM | POA: Diagnosis not present

## 2022-08-27 DIAGNOSIS — E0822 Diabetes mellitus due to underlying condition with diabetic chronic kidney disease: Secondary | ICD-10-CM | POA: Diagnosis not present

## 2022-08-27 NOTE — Telephone Encounter (Signed)
Patient states he is also experiencing leg, foot and side pain.

## 2022-08-27 NOTE — Telephone Encounter (Signed)
Pt is wanting to get Vit D checked and still having abdominal pain.

## 2022-08-27 NOTE — Progress Notes (Unsigned)
ANNUAL DIABETIC FOOT EXAM  Subjective: Jeremy [name]. presents today {jgcomplaint:23593}.  Chief Complaint  Patient presents with   Follow-up    Patient is a diabetic. B.S. am-76 Last A1c/unknown Jeremy Penning, NP/Last visit- 1 week ago Patient is taking all medications prescribed.    Patient confirms h/o diabetes.  Patient relates {Numbers; 0-100:15068} year h/o diabetes.  Patient denies any h/o foot wounds.  Patient has h/o foot ulcer of {jgPodToeLocator:23637}, which healed via help of ***.  Patient has h/o amputation(s): {jgamp:23617}.  Patient endorses symptoms of foot numbness.   Patient endorses symptoms of foot tingling.  Patient endorses symptoms of burning in feet.  Patient endorses symptoms of pins/needles sensation in feet.  Patient denies any numbness, tingling, burning, or pins/needle sensation in feet.  Patient has been diagnosed with neuropathy and it is managed with {JGNEUROPATHYMEDS:27053}.  Risk factors: {jgriskfactors:24044}.  Jeremy Pounds, NP is patient's PCP. Last visit was {Time; dates multiple:15870}***.  Past Medical History:  Diagnosis Date   Abscess    AKI (acute kidney injury) ([place]) 08/05/2017   Bell's palsy    right eye abn since dx bell's palsy   CKD (chronic kidney disease)    Dr. McDiarmind    Dyspnea    occasional with exertion   ESRD (end stage renal disease) ([place])    MWF -[facility]   Headache    High cholesterol    Hypertension    Left shoulder pain 10/26/2013   S/p injection on 10/26/13    Renal insufficiency 02/14/2014   Seizures ([place]) 08/04/2017   "related to high blood sugars" (08/05/2017)   Type II diabetes mellitus ([place])    Patient Active Problem List   Diagnosis Date Noted   Pain, unspecified 12/31/2021   Encounter for removal of sutures 10/05/2021   ESRD on dialysis ([place]) 11/09/2019   Allergy, unspecified, initial encounter 08/16/2019   Anaphylactic shock, unspecified, initial encounter  08/16/2019   Anemia in chronic kidney disease 45/09/8880   Complication of vascular dialysis catheter 08/16/2019   Fever, unspecified 08/16/2019   Iron deficiency anemia, unspecified 08/16/2019   Other specified coagulation defects ([place]) 08/16/2019   Pruritus, unspecified 08/16/2019   Shortness of breath 08/16/2019   Type 2 diabetes mellitus with diabetic peripheral angiopathy without gangrene ([place]) 08/16/2019   COVID-19 virus infection 08/08/2019   Acute right ankle pain 07/27/2019   Tinnitus 05/26/2019   OSA (obstructive sleep apnea) 10/07/2018   Resistant hypertension 09/23/2018   Secondary hyperparathyroidism ([place]) 04/06/2018   Benign neoplasm of pituitary gland ([place]) 08/10/2017   Seizure ([place])    Bilateral lower extremity edema 04/08/2017   Uncontrolled type 2 diabetes mellitus with diabetic nephropathy, with long-term current use of insulin 03/03/2017   Chronic kidney disease (CKD), stage IV (severe) ([place]) 02/14/2014   Morbid obesity ([place]) 05/20/2013   Erectile dysfunction associated with type 2 diabetes mellitus ([place]) 06/02/2012   Macular edema 12/12/2011   Mixed hyperlipidemia due to type 2 diabetes mellitus ([place]) 11/11/2011   Past Surgical History:  Procedure Laterality Date   A/V FISTULAGRAM Left 07/25/2022   Procedure: A/V Fistulagram;  Surgeon: [name], MD;  Location: [facility];  Service: Cardiovascular;  Laterality: Left;   AV FISTULA PLACEMENT Left 11/11/2019   Procedure: LEFT FOREARM RADIOCEPHALIC ARTERIOVENOUS (AV) FISTULA CREATION;  Surgeon: [name], MD;  Location: [facility];  Service: Vascular;  Laterality: Left;   AV FISTULA PLACEMENT Left 08/14/2022   Procedure: FIRST STAGE BRACHIOBASILIC LEFT ARM ARTERIOVENOUS (AV) FISTULA CREATION;  Surgeon: [name], MD;  Location: [facility];  Service: Vascular;  Laterality: Left;   Eyelid surgery Right    I & D EXTREMITY  11/11/2011   Procedure: IRRIGATION AND DEBRIDEMENT EXTREMITY;   Surgeon: Merrie Roof, MD;  Location: [facility];  Service: General;  Laterality: Right;  I&D Right Thigh Abscess   INSERTION OF DIALYSIS CATHETER N/A 07/25/2022   Procedure: INSERTION OF DIALYSIS CATHETER;  Surgeon: [name], MD;  Location: [facility];  Service: Vascular;  Laterality: N/A;   IR FLUORO GUIDE CV LINE RIGHT  08/12/2019   IR US GUIDE VASC ACCESS RIGHT  08/12/2019   LIGATION OF ARTERIOVENOUS  FISTULA Left 08/14/2022   Procedure: LIGATION OF BRACHIOCEPHALIC ARTERIOVENOUS  FISTULA;  Surgeon: [name], MD;  Location: [facility];  Service: Vascular;  Laterality: Left;   REVISON OF ARTERIOVENOUS FISTULA Left 03/23/2020   Procedure: LEFT ARM ARTERIOVENOUS FISTULA REVISON, LIGATION OF SIDE BRANCHES AND ELEVATION;  Surgeon: [name], MD;  Location: [facility];  Service: Vascular;  Laterality: Left;   Current Outpatient Medications on File Prior to Visit  Medication Sig Dispense Refill   amLODipine (NORVASC) 5 MG tablet Take 1 tablet (5 mg total) by mouth daily. (Patient not taking: Reported on 08/14/2022) 90 tablet 1   aspirin EC (ASPIRIN LOW DOSE) 81 MG tablet Take 1 tablet (81 mg total) by mouth daily. 90 tablet 0   atorvastatin (LIPITOR) 40 MG tablet Take 1 tablet (40 mg total) by mouth daily. (Patient not taking: Reported on 08/20/2022) 90 tablet 3   Blood Glucose Monitoring Suppl (CONTOUR NEXT EZ) w/Device KIT Use to check blood sugar three times daily. 1 kit 0   carvedilol (COREG) 3.125 MG tablet Take 1 tablet (3.125 mg total) by mouth 2 (two) times daily with a meal. 180 tablet 0   Continuous Blood Gluc Receiver (FREESTYLE LIBRE 2 READER) DEVI Use to check blood sugar three times daily. 1 each 0   Continuous Blood Gluc Sensor (FREESTYLE LIBRE 2 SENSOR) MISC Use to check blood sugar three times daily. 2 each 3   diclofenac Sodium (VOLTAREN) 1 % GEL Apply 4 g topically 4 (four) times daily. 200 g 1   enalapril (VASOTEC) 5 MG tablet Take 1 tablet (5 mg total) by mouth daily. 90  tablet 1   glucose blood (CONTOUR NEXT TEST) test strip Use to check blood sugar three times daily. 100 each 12   HYDROcodone-acetaminophen (NORCO) 5-325 MG tablet Take 1 tablet by mouth every 6 (six) hours as needed for moderate pain. 12 tablet 0   insulin glargine (LANTUS SOLOSTAR) 100 UNIT/ML Solostar Pen Inject 40 Units into the skin daily. After 1 week, may increase to 50 units daily if blood sugar at home is  above 200. 30 mL 2   insulin lispro (HUMALOG KWIKPEN) 100 UNIT/ML KwikPen Inject 6 Units into the skin 3 (three) times daily with meals. 15 mL 11   Insulin Pen Needle (B-D ULTRAFINE III SHORT PEN) 31G X 8 MM MISC 1 application  by Does not apply route 3 (three) times daily. E11.65 200 each 11   Lancet Devices (MICROLET NEXT LANCING DEVICE) MISC 1 Device by Does not apply route 3 (three) times daily. 1 each 11   levETIRAcetam (KEPPRA XR) 500 MG 24 hr tablet Take 1 tablet (500 mg total) by mouth in the morning and at bedtime. 180 tablet 1   Microlet Lancets MISC Use to check blood sugar three times daily. E11.65  100 each 3   VELPHORO 500 MG chewable tablet Chew 500 mg by mouth 3 (three) times daily with meals.     Current Facility-Administered Medications on File Prior to Visit  Medication Dose Route Frequency Provider Last Rate Last Admin   0.9 %  sodium chloride infusion  250 mL Intravenous PRN [name], MD       sodium chloride flush (NS) 0.9 % injection 3 mL  3 mL Intravenous Q12H [name], MD        Allergies  Allergen Reactions   Amlodipine Other (See Comments)    Dizziness   Social History   Occupational History   Occupation: maintenance tech   Occupation: [employer]  Tobacco Use   Smoking status: Never   Smokeless tobacco: Never  Vaping Use   Vaping Use: Never used  Substance and Sexual Activity   Alcohol use: Not Currently   Drug use: No   Sexual activity: Yes   Family History  Problem Relation Age of Onset   Diabetes Mother    Heart  disease Mother 53   Diabetes Sister    Diabetes Brother    Diabetes Brother    Cancer Maternal Uncle        type unknown   Seizures Niece    Stroke Neg Hx    Immunization History  Administered Date(s) Administered   Hepatitis B, adult 09/10/2019   Hepb-cpg 01/13/2020, 03/08/2020, 04/07/2020, 06/09/2020   Influenza Split 04/28/2013   Influenza, Quadrivalent, Recombinant, Inj, Pf 05/01/2022   Influenza,inj,Quad PF,6+ Mos 03/14/2016, 04/17/2017, 04/02/2018, 05/25/2019, 04/27/2021   Moderna Sars-Covid-2 Vaccination 10/08/2019, 11/05/2019   Pneumococcal Polysaccharide-23 03/14/2016, 08/11/2017   Tdap 03/14/2016   Zoster Recombinat (Shingrix) 08/20/2022     Review of Systems: Negative except as noted in the HPI.   Objective: Vitals:    Bjorn Hallas. is a pleasant 57 y.o. male in NAD. AAO X 3.  Vascular Examination: {jgvascular:23595}  Dermatological Examination: {jgderm:23598}  Neurological Examination: {jgneuro:23601::"Protective sensation intact 5/5 intact bilaterally with 10g monofilament b/l.","Vibratory sensation intact b/l.","Proprioception intact bilaterally."}  Musculoskeletal Examination: {jgmsk:23600}  Footwear Assessment: Does the patient wear appropriate shoes? {Yes,No}. Does the patient need inserts/orthotics? {Yes,No}.  Lab Results  Component Value Date   HGBA1C 11.9 (A) 08/20/2022    No results found. ADA Risk Categorization: Low Risk :  Patient has all of the following: Intact protective sensation No prior foot ulcer  No severe deformity Pedal pulses present  High Risk  Patient has one or more of the following: Loss of protective sensation Absent pedal pulses Severe Foot deformity History of foot ulcer  Assessment: No diagnosis found.   Plan: No orders of the defined types were placed in this encounter.   No orders of the defined types were placed in this encounter.   None  {jgplan:23602::"-Patient/POA to call should there  be question/concern in the interim."} Return in about 3 months (around 11/25/2022).  Marzetta Board, DPM

## 2022-08-27 NOTE — Telephone Encounter (Signed)
Patient call for lab results.Shared provider's note. Gildardo Pounds, NP 08/21/2022  9:04 PM EST Back to Top    Normal thyroid level   Pt is interested in his vitamin D levels and would like to get that checked. He also states that his stomach is still bothering him.  Please advise.

## 2022-08-28 ENCOUNTER — Other Ambulatory Visit: Payer: Self-pay | Admitting: Nurse Practitioner

## 2022-08-28 ENCOUNTER — Encounter: Payer: Self-pay | Admitting: Nurse Practitioner

## 2022-08-28 DIAGNOSIS — D688 Other specified coagulation defects: Secondary | ICD-10-CM | POA: Diagnosis not present

## 2022-08-28 DIAGNOSIS — D509 Iron deficiency anemia, unspecified: Secondary | ICD-10-CM | POA: Diagnosis not present

## 2022-08-28 DIAGNOSIS — E559 Vitamin D deficiency, unspecified: Secondary | ICD-10-CM

## 2022-08-28 DIAGNOSIS — N186 End stage renal disease: Secondary | ICD-10-CM | POA: Diagnosis not present

## 2022-08-28 DIAGNOSIS — E1129 Type 2 diabetes mellitus with other diabetic kidney complication: Secondary | ICD-10-CM | POA: Diagnosis not present

## 2022-08-28 DIAGNOSIS — N2581 Secondary hyperparathyroidism of renal origin: Secondary | ICD-10-CM | POA: Diagnosis not present

## 2022-08-28 DIAGNOSIS — Z992 Dependence on renal dialysis: Secondary | ICD-10-CM | POA: Diagnosis not present

## 2022-08-28 NOTE — Telephone Encounter (Signed)
Referred to GI for colonoscopy. He can stop by the lab anytime for his vitamin D level to be checked

## 2022-08-29 ENCOUNTER — Other Ambulatory Visit: Payer: Self-pay | Admitting: *Deleted

## 2022-08-29 ENCOUNTER — Telehealth: Payer: Self-pay | Admitting: Emergency Medicine

## 2022-08-29 DIAGNOSIS — N186 End stage renal disease: Secondary | ICD-10-CM

## 2022-08-29 NOTE — Telephone Encounter (Signed)
Patient aware of providers response

## 2022-08-29 NOTE — Telephone Encounter (Signed)
Unable to reach patient by phone to relay results.  Unable to leave voicemail due to not being set up.

## 2022-08-29 NOTE — Telephone Encounter (Signed)
Copied from [place] [id number]. Topic: General - Inquiry >> Aug 29, 2022  9:42 AM Leilani Able wrote: Pt wants a fu call at [phone] states a call has come in from office and no message was left. He states it could be about his Vitamin D levels and he wants a fu call /no VM

## 2022-08-29 NOTE — Telephone Encounter (Signed)
Return call unanswered.  

## 2022-08-30 ENCOUNTER — Other Ambulatory Visit: Payer: Self-pay

## 2022-08-30 DIAGNOSIS — D688 Other specified coagulation defects: Secondary | ICD-10-CM | POA: Diagnosis not present

## 2022-08-30 DIAGNOSIS — N2581 Secondary hyperparathyroidism of renal origin: Secondary | ICD-10-CM | POA: Diagnosis not present

## 2022-08-30 DIAGNOSIS — E1129 Type 2 diabetes mellitus with other diabetic kidney complication: Secondary | ICD-10-CM | POA: Diagnosis not present

## 2022-08-30 DIAGNOSIS — D509 Iron deficiency anemia, unspecified: Secondary | ICD-10-CM | POA: Diagnosis not present

## 2022-08-30 DIAGNOSIS — N186 End stage renal disease: Secondary | ICD-10-CM | POA: Diagnosis not present

## 2022-08-30 DIAGNOSIS — Z992 Dependence on renal dialysis: Secondary | ICD-10-CM | POA: Diagnosis not present

## 2022-09-02 DIAGNOSIS — N186 End stage renal disease: Secondary | ICD-10-CM | POA: Diagnosis not present

## 2022-09-02 DIAGNOSIS — D688 Other specified coagulation defects: Secondary | ICD-10-CM | POA: Diagnosis not present

## 2022-09-02 DIAGNOSIS — E1129 Type 2 diabetes mellitus with other diabetic kidney complication: Secondary | ICD-10-CM | POA: Diagnosis not present

## 2022-09-02 DIAGNOSIS — N2581 Secondary hyperparathyroidism of renal origin: Secondary | ICD-10-CM | POA: Diagnosis not present

## 2022-09-02 DIAGNOSIS — Z992 Dependence on renal dialysis: Secondary | ICD-10-CM | POA: Diagnosis not present

## 2022-09-02 DIAGNOSIS — D509 Iron deficiency anemia, unspecified: Secondary | ICD-10-CM | POA: Diagnosis not present

## 2022-09-04 DIAGNOSIS — Z992 Dependence on renal dialysis: Secondary | ICD-10-CM | POA: Diagnosis not present

## 2022-09-04 DIAGNOSIS — D509 Iron deficiency anemia, unspecified: Secondary | ICD-10-CM | POA: Diagnosis not present

## 2022-09-04 DIAGNOSIS — E1129 Type 2 diabetes mellitus with other diabetic kidney complication: Secondary | ICD-10-CM | POA: Diagnosis not present

## 2022-09-04 DIAGNOSIS — D688 Other specified coagulation defects: Secondary | ICD-10-CM | POA: Diagnosis not present

## 2022-09-04 DIAGNOSIS — N2581 Secondary hyperparathyroidism of renal origin: Secondary | ICD-10-CM | POA: Diagnosis not present

## 2022-09-04 DIAGNOSIS — N186 End stage renal disease: Secondary | ICD-10-CM | POA: Diagnosis not present

## 2022-09-06 DIAGNOSIS — N186 End stage renal disease: Secondary | ICD-10-CM | POA: Diagnosis not present

## 2022-09-06 DIAGNOSIS — D509 Iron deficiency anemia, unspecified: Secondary | ICD-10-CM | POA: Diagnosis not present

## 2022-09-06 DIAGNOSIS — E1129 Type 2 diabetes mellitus with other diabetic kidney complication: Secondary | ICD-10-CM | POA: Diagnosis not present

## 2022-09-06 DIAGNOSIS — N2581 Secondary hyperparathyroidism of renal origin: Secondary | ICD-10-CM | POA: Diagnosis not present

## 2022-09-06 DIAGNOSIS — Z992 Dependence on renal dialysis: Secondary | ICD-10-CM | POA: Diagnosis not present

## 2022-09-06 DIAGNOSIS — D688 Other specified coagulation defects: Secondary | ICD-10-CM | POA: Diagnosis not present

## 2022-09-09 DIAGNOSIS — D509 Iron deficiency anemia, unspecified: Secondary | ICD-10-CM | POA: Diagnosis not present

## 2022-09-09 DIAGNOSIS — N186 End stage renal disease: Secondary | ICD-10-CM | POA: Diagnosis not present

## 2022-09-09 DIAGNOSIS — E1129 Type 2 diabetes mellitus with other diabetic kidney complication: Secondary | ICD-10-CM | POA: Diagnosis not present

## 2022-09-09 DIAGNOSIS — D688 Other specified coagulation defects: Secondary | ICD-10-CM | POA: Diagnosis not present

## 2022-09-09 DIAGNOSIS — Z992 Dependence on renal dialysis: Secondary | ICD-10-CM | POA: Diagnosis not present

## 2022-09-09 DIAGNOSIS — N2581 Secondary hyperparathyroidism of renal origin: Secondary | ICD-10-CM | POA: Diagnosis not present

## 2022-09-10 ENCOUNTER — Ambulatory Visit: Payer: Self-pay | Admitting: Internal Medicine

## 2022-09-11 DIAGNOSIS — D509 Iron deficiency anemia, unspecified: Secondary | ICD-10-CM | POA: Diagnosis not present

## 2022-09-11 DIAGNOSIS — N186 End stage renal disease: Secondary | ICD-10-CM | POA: Diagnosis not present

## 2022-09-11 DIAGNOSIS — E1129 Type 2 diabetes mellitus with other diabetic kidney complication: Secondary | ICD-10-CM | POA: Diagnosis not present

## 2022-09-11 DIAGNOSIS — Z992 Dependence on renal dialysis: Secondary | ICD-10-CM | POA: Diagnosis not present

## 2022-09-11 DIAGNOSIS — N2581 Secondary hyperparathyroidism of renal origin: Secondary | ICD-10-CM | POA: Diagnosis not present

## 2022-09-11 DIAGNOSIS — D688 Other specified coagulation defects: Secondary | ICD-10-CM | POA: Diagnosis not present

## 2022-09-13 DIAGNOSIS — N2581 Secondary hyperparathyroidism of renal origin: Secondary | ICD-10-CM | POA: Diagnosis not present

## 2022-09-13 DIAGNOSIS — D509 Iron deficiency anemia, unspecified: Secondary | ICD-10-CM | POA: Diagnosis not present

## 2022-09-13 DIAGNOSIS — N186 End stage renal disease: Secondary | ICD-10-CM | POA: Diagnosis not present

## 2022-09-13 DIAGNOSIS — D688 Other specified coagulation defects: Secondary | ICD-10-CM | POA: Diagnosis not present

## 2022-09-13 DIAGNOSIS — E1129 Type 2 diabetes mellitus with other diabetic kidney complication: Secondary | ICD-10-CM | POA: Diagnosis not present

## 2022-09-13 DIAGNOSIS — Z992 Dependence on renal dialysis: Secondary | ICD-10-CM | POA: Diagnosis not present

## 2022-09-16 DIAGNOSIS — D509 Iron deficiency anemia, unspecified: Secondary | ICD-10-CM | POA: Diagnosis not present

## 2022-09-16 DIAGNOSIS — D688 Other specified coagulation defects: Secondary | ICD-10-CM | POA: Diagnosis not present

## 2022-09-16 DIAGNOSIS — N2581 Secondary hyperparathyroidism of renal origin: Secondary | ICD-10-CM | POA: Diagnosis not present

## 2022-09-16 DIAGNOSIS — Z992 Dependence on renal dialysis: Secondary | ICD-10-CM | POA: Diagnosis not present

## 2022-09-16 DIAGNOSIS — N186 End stage renal disease: Secondary | ICD-10-CM | POA: Diagnosis not present

## 2022-09-16 DIAGNOSIS — E1129 Type 2 diabetes mellitus with other diabetic kidney complication: Secondary | ICD-10-CM | POA: Diagnosis not present

## 2022-09-17 ENCOUNTER — Ambulatory Visit (INDEPENDENT_AMBULATORY_CARE_PROVIDER_SITE_OTHER): Payer: Medicare Other | Admitting: Physician Assistant

## 2022-09-17 ENCOUNTER — Ambulatory Visit (HOSPITAL_COMMUNITY)
Admission: RE | Admit: 2022-09-17 | Discharge: 2022-09-17 | Disposition: A | Discharge status: Home / Self Care | Payer: Medicare Other | Source: Ambulatory Visit | Attending: Vascular Surgery | Admitting: Vascular Surgery

## 2022-09-17 ENCOUNTER — Ambulatory Visit: Payer: Medicaid Other | Admitting: Pharmacist

## 2022-09-17 VITALS — BP 139/73 | HR 77 | Temp 97.7°F | Resp 20 | Ht 68.0 in | Wt 301.5 lb

## 2022-09-17 DIAGNOSIS — N186 End stage renal disease: Secondary | ICD-10-CM

## 2022-09-17 DIAGNOSIS — Z992 Dependence on renal dialysis: Secondary | ICD-10-CM | POA: Diagnosis not present

## 2022-09-17 NOTE — Progress Notes (Signed)
POST OPERATIVE OFFICE NOTE    CC:  F/u for surgery  HPI:  This is a 57 y.o. male who is s/p ligation of left RC AVF and creation of left 1st stage BVT on 08/14/2022 by Dr. Carlis Abbott.  He had TDC placed by Dr. Carlis Abbott on 07/25/2022.  He states this is working well.   Pt states he does not have pain/numbness in the left hand.   He only gets numbness in both hands when he is on dialysis.    He inquires about excision of his old fistula because he does not like the way it looks.    The pt is on dialysis M/W/F at [facility] location.   Allergies  Allergen Reactions   Amlodipine Other (See Comments)    Dizziness    Current Outpatient Medications  Medication Sig Dispense Refill   amLODipine (NORVASC) 5 MG tablet Take 1 tablet (5 mg total) by mouth daily. 90 tablet 1   aspirin EC (ASPIRIN LOW DOSE) 81 MG tablet Take 1 tablet (81 mg total) by mouth daily. 90 tablet 0   Blood Glucose Monitoring Suppl (CONTOUR NEXT EZ) w/Device KIT Use to check blood sugar three times daily. 1 kit 0   Continuous Blood Gluc Receiver (FREESTYLE LIBRE 2 READER) DEVI Use to check blood sugar three times daily. 1 each 0   Continuous Blood Gluc Sensor (FREESTYLE LIBRE 2 SENSOR) MISC Use to check blood sugar three times daily. 2 each 3   diclofenac Sodium (VOLTAREN) 1 % GEL Apply 4 g topically 4 (four) times daily. 200 g 1   Doxercalciferol (HECTOROL IV) Doxercalciferol (Hectorol)     enalapril (VASOTEC) 5 MG tablet Take 1 tablet (5 mg total) by mouth daily. 90 tablet 1   glucose blood (CONTOUR NEXT TEST) test strip Use to check blood sugar three times daily. 100 each 12   HYDROcodone-acetaminophen (NORCO) 5-325 MG tablet Take 1 tablet by mouth every 6 (six) hours as needed for moderate pain. 12 tablet 0   insulin glargine (LANTUS SOLOSTAR) 100 UNIT/ML Solostar Pen Inject 40 Units into the skin daily. After 1 week, may increase to 50 units daily if blood sugar at home is  above 200. 30 mL 2   insulin lispro (HUMALOG KWIKPEN)  100 UNIT/ML KwikPen Inject 6 Units into the skin 3 (three) times daily with meals. 15 mL 11   Insulin Pen Needle (B-D ULTRAFINE III SHORT PEN) 31G X 8 MM MISC 1 application  by Does not apply route 3 (three) times daily. E11.65 200 each 11   Lancet Devices (MICROLET NEXT LANCING DEVICE) MISC 1 Device by Does not apply route 3 (three) times daily. 1 each 11   levETIRAcetam (KEPPRA XR) 500 MG 24 hr tablet Take 1 tablet (500 mg total) by mouth in the morning and at bedtime. 180 tablet 1   Microlet Lancets MISC Use to check blood sugar three times daily. E11.65 100 each 3   VELPHORO 500 MG chewable tablet Chew 500 mg by mouth 3 (three) times daily with meals.     atorvastatin (LIPITOR) 40 MG tablet Take 1 tablet (40 mg total) by mouth daily. (Patient not taking: Reported on 08/20/2022) 90 tablet 3   carvedilol (COREG) 3.125 MG tablet Take 1 tablet (3.125 mg total) by mouth 2 (two) times daily with a meal. 180 tablet 0   Current Facility-Administered Medications  Medication Dose Route Frequency Provider Last Rate Last Admin   0.9 %  sodium chloride infusion  250 mL  Intravenous PRN [name], MD       sodium chloride flush (NS) 0.9 % injection 3 mL  3 mL Intravenous Q12H [name], MD         ROS:  See HPI  Physical Exam:  Today's Vitals   09/17/22 1030  BP: 139/73  Pulse: 77  Resp: 20  Temp: 97.7 F (36.5 C)  TempSrc: Temporal  SpO2: 99%  Weight: (!) 301 lb 8 oz (136.8 kg)  Height: '5\' 8"'$  (1.727 m)  PainSc: 5    Body mass index is 45.84 kg/m.   Incision:  healed nicely Extremities:   There is a palpable right and left radial pulse.   Motor and sensory are in tact.   There is a thrill/bruit present.  Access is  easily palpable proximally   Dialysis Duplex on 09/17/2022: +--------------------+----------+-----------------+--------+  AVF                PSV (cm/s)Flow Vol (mL/min)Comments  +--------------------+----------+-----------------+--------+   Native artery inflow   199          1013                 +--------------------+----------+-----------------+--------+  AVF Anastomosis        293                               +--------------------+----------+-----------------+--------+  +------------+----------+-------------+----------+--------+  OUTFLOW VEINPSV (cm/s)Diameter (cm)Depth (cm)Describe  +------------+----------+-------------+----------+--------+  Prox UA        211        0.77        1.64             +------------+----------+-------------+----------+--------+  Mid UA         207        0.23        1.05             +------------+----------+-------------+----------+--------+  Dist UA        314        0.81        0.79             +------------+----------+-------------+----------+--------+  AC Fossa       350        0.41        1.10             +------------+----------+-------------+----------+--------+    Assessment/Plan:  This is a 57 y.o. male who is s/p:  ligation of left RC AVF and creation of left 1st stage BVT on 08/14/2022 by Dr. Carlis Abbott.  He had TDC placed by Dr. Carlis Abbott on 07/25/2022.  He states this is working well.   -the pt does not have evidence of steal.  He does have numbness in both hands at dialysis.   -duplex today reveals that most of the fistula is maturing nicely, however, there is an area that is not maturing.   -pt is also inquiring about excision of his old fistula.  -will have the pt return in 4-6 weeks with repeat duplex and see Dr. Carlis Abbott for discussion of 2nd stage BVT vs fistulogram if fistula does not mature and possible excision of old fistula.  -discussed with pt that access does not last forever and will need intervention or even new access at some point.     Leontine Locket, [facility] [phone]  Clinic MD:  Carlis Abbott

## 2022-09-18 DIAGNOSIS — N186 End stage renal disease: Secondary | ICD-10-CM | POA: Diagnosis not present

## 2022-09-18 DIAGNOSIS — Z992 Dependence on renal dialysis: Secondary | ICD-10-CM | POA: Diagnosis not present

## 2022-09-18 DIAGNOSIS — D509 Iron deficiency anemia, unspecified: Secondary | ICD-10-CM | POA: Diagnosis not present

## 2022-09-18 DIAGNOSIS — N2581 Secondary hyperparathyroidism of renal origin: Secondary | ICD-10-CM | POA: Diagnosis not present

## 2022-09-18 DIAGNOSIS — E1129 Type 2 diabetes mellitus with other diabetic kidney complication: Secondary | ICD-10-CM | POA: Diagnosis not present

## 2022-09-18 DIAGNOSIS — D688 Other specified coagulation defects: Secondary | ICD-10-CM | POA: Diagnosis not present

## 2022-09-19 DIAGNOSIS — N186 End stage renal disease: Secondary | ICD-10-CM | POA: Diagnosis not present

## 2022-09-19 DIAGNOSIS — Z992 Dependence on renal dialysis: Secondary | ICD-10-CM | POA: Diagnosis not present

## 2022-09-19 DIAGNOSIS — I129 Hypertensive chronic kidney disease with stage 1 through stage 4 chronic kidney disease, or unspecified chronic kidney disease: Secondary | ICD-10-CM | POA: Diagnosis not present

## 2022-09-20 ENCOUNTER — Other Ambulatory Visit: Payer: Self-pay

## 2022-09-20 DIAGNOSIS — D509 Iron deficiency anemia, unspecified: Secondary | ICD-10-CM | POA: Diagnosis not present

## 2022-09-20 DIAGNOSIS — N2581 Secondary hyperparathyroidism of renal origin: Secondary | ICD-10-CM | POA: Diagnosis not present

## 2022-09-20 DIAGNOSIS — Z992 Dependence on renal dialysis: Secondary | ICD-10-CM | POA: Diagnosis not present

## 2022-09-20 DIAGNOSIS — D688 Other specified coagulation defects: Secondary | ICD-10-CM | POA: Diagnosis not present

## 2022-09-20 DIAGNOSIS — N186 End stage renal disease: Secondary | ICD-10-CM | POA: Diagnosis not present

## 2022-09-20 DIAGNOSIS — E1129 Type 2 diabetes mellitus with other diabetic kidney complication: Secondary | ICD-10-CM | POA: Diagnosis not present

## 2022-09-23 DIAGNOSIS — Z992 Dependence on renal dialysis: Secondary | ICD-10-CM | POA: Diagnosis not present

## 2022-09-23 DIAGNOSIS — N186 End stage renal disease: Secondary | ICD-10-CM | POA: Diagnosis not present

## 2022-09-23 DIAGNOSIS — D688 Other specified coagulation defects: Secondary | ICD-10-CM | POA: Diagnosis not present

## 2022-09-23 DIAGNOSIS — E1129 Type 2 diabetes mellitus with other diabetic kidney complication: Secondary | ICD-10-CM | POA: Diagnosis not present

## 2022-09-23 DIAGNOSIS — N2581 Secondary hyperparathyroidism of renal origin: Secondary | ICD-10-CM | POA: Diagnosis not present

## 2022-09-23 DIAGNOSIS — D509 Iron deficiency anemia, unspecified: Secondary | ICD-10-CM | POA: Diagnosis not present

## 2022-09-25 DIAGNOSIS — N186 End stage renal disease: Secondary | ICD-10-CM | POA: Diagnosis not present

## 2022-09-25 DIAGNOSIS — D509 Iron deficiency anemia, unspecified: Secondary | ICD-10-CM | POA: Diagnosis not present

## 2022-09-25 DIAGNOSIS — E1129 Type 2 diabetes mellitus with other diabetic kidney complication: Secondary | ICD-10-CM | POA: Diagnosis not present

## 2022-09-25 DIAGNOSIS — Z992 Dependence on renal dialysis: Secondary | ICD-10-CM | POA: Diagnosis not present

## 2022-09-25 DIAGNOSIS — N2581 Secondary hyperparathyroidism of renal origin: Secondary | ICD-10-CM | POA: Diagnosis not present

## 2022-09-25 DIAGNOSIS — D688 Other specified coagulation defects: Secondary | ICD-10-CM | POA: Diagnosis not present

## 2022-09-27 DIAGNOSIS — Z992 Dependence on renal dialysis: Secondary | ICD-10-CM | POA: Diagnosis not present

## 2022-09-27 DIAGNOSIS — N186 End stage renal disease: Secondary | ICD-10-CM | POA: Diagnosis not present

## 2022-09-27 DIAGNOSIS — D688 Other specified coagulation defects: Secondary | ICD-10-CM | POA: Diagnosis not present

## 2022-09-27 DIAGNOSIS — N2581 Secondary hyperparathyroidism of renal origin: Secondary | ICD-10-CM | POA: Diagnosis not present

## 2022-09-27 DIAGNOSIS — D509 Iron deficiency anemia, unspecified: Secondary | ICD-10-CM | POA: Diagnosis not present

## 2022-09-27 DIAGNOSIS — E1129 Type 2 diabetes mellitus with other diabetic kidney complication: Secondary | ICD-10-CM | POA: Diagnosis not present

## 2022-09-30 DIAGNOSIS — Z992 Dependence on renal dialysis: Secondary | ICD-10-CM | POA: Diagnosis not present

## 2022-09-30 DIAGNOSIS — N186 End stage renal disease: Secondary | ICD-10-CM | POA: Diagnosis not present

## 2022-09-30 DIAGNOSIS — N2581 Secondary hyperparathyroidism of renal origin: Secondary | ICD-10-CM | POA: Diagnosis not present

## 2022-09-30 DIAGNOSIS — D688 Other specified coagulation defects: Secondary | ICD-10-CM | POA: Diagnosis not present

## 2022-09-30 DIAGNOSIS — D509 Iron deficiency anemia, unspecified: Secondary | ICD-10-CM | POA: Diagnosis not present

## 2022-09-30 DIAGNOSIS — E1129 Type 2 diabetes mellitus with other diabetic kidney complication: Secondary | ICD-10-CM | POA: Diagnosis not present

## 2022-10-02 ENCOUNTER — Telehealth: Payer: Self-pay | Admitting: Nurse Practitioner

## 2022-10-02 DIAGNOSIS — N2581 Secondary hyperparathyroidism of renal origin: Secondary | ICD-10-CM | POA: Diagnosis not present

## 2022-10-02 DIAGNOSIS — E1129 Type 2 diabetes mellitus with other diabetic kidney complication: Secondary | ICD-10-CM | POA: Diagnosis not present

## 2022-10-02 DIAGNOSIS — D509 Iron deficiency anemia, unspecified: Secondary | ICD-10-CM | POA: Diagnosis not present

## 2022-10-02 DIAGNOSIS — Z992 Dependence on renal dialysis: Secondary | ICD-10-CM | POA: Diagnosis not present

## 2022-10-02 DIAGNOSIS — N186 End stage renal disease: Secondary | ICD-10-CM | POA: Diagnosis not present

## 2022-10-02 DIAGNOSIS — D688 Other specified coagulation defects: Secondary | ICD-10-CM | POA: Diagnosis not present

## 2022-10-02 NOTE — Telephone Encounter (Signed)
Called patient to schedule Medicare Annual Wellness Visit (AWV). No voicemail available to leave a message.  Last date of AWV: due awvi 07/22/20 per [facility]    If any questions, please contact me at [phone].  Thank you ,  Barkley Boards AWV direct phone # [phone]

## 2022-10-04 DIAGNOSIS — Z992 Dependence on renal dialysis: Secondary | ICD-10-CM | POA: Diagnosis not present

## 2022-10-04 DIAGNOSIS — N2581 Secondary hyperparathyroidism of renal origin: Secondary | ICD-10-CM | POA: Diagnosis not present

## 2022-10-04 DIAGNOSIS — D688 Other specified coagulation defects: Secondary | ICD-10-CM | POA: Diagnosis not present

## 2022-10-04 DIAGNOSIS — D509 Iron deficiency anemia, unspecified: Secondary | ICD-10-CM | POA: Diagnosis not present

## 2022-10-04 DIAGNOSIS — E1129 Type 2 diabetes mellitus with other diabetic kidney complication: Secondary | ICD-10-CM | POA: Diagnosis not present

## 2022-10-04 DIAGNOSIS — N186 End stage renal disease: Secondary | ICD-10-CM | POA: Diagnosis not present

## 2022-10-07 DIAGNOSIS — D688 Other specified coagulation defects: Secondary | ICD-10-CM | POA: Diagnosis not present

## 2022-10-07 DIAGNOSIS — D509 Iron deficiency anemia, unspecified: Secondary | ICD-10-CM | POA: Diagnosis not present

## 2022-10-07 DIAGNOSIS — Z992 Dependence on renal dialysis: Secondary | ICD-10-CM | POA: Diagnosis not present

## 2022-10-07 DIAGNOSIS — N2581 Secondary hyperparathyroidism of renal origin: Secondary | ICD-10-CM | POA: Diagnosis not present

## 2022-10-07 DIAGNOSIS — N186 End stage renal disease: Secondary | ICD-10-CM | POA: Diagnosis not present

## 2022-10-07 DIAGNOSIS — E1129 Type 2 diabetes mellitus with other diabetic kidney complication: Secondary | ICD-10-CM | POA: Diagnosis not present

## 2022-10-09 DIAGNOSIS — D688 Other specified coagulation defects: Secondary | ICD-10-CM | POA: Diagnosis not present

## 2022-10-09 DIAGNOSIS — D509 Iron deficiency anemia, unspecified: Secondary | ICD-10-CM | POA: Diagnosis not present

## 2022-10-09 DIAGNOSIS — N186 End stage renal disease: Secondary | ICD-10-CM | POA: Diagnosis not present

## 2022-10-09 DIAGNOSIS — E1129 Type 2 diabetes mellitus with other diabetic kidney complication: Secondary | ICD-10-CM | POA: Diagnosis not present

## 2022-10-09 DIAGNOSIS — N2581 Secondary hyperparathyroidism of renal origin: Secondary | ICD-10-CM | POA: Diagnosis not present

## 2022-10-09 DIAGNOSIS — Z992 Dependence on renal dialysis: Secondary | ICD-10-CM | POA: Diagnosis not present

## 2022-10-11 DIAGNOSIS — N2581 Secondary hyperparathyroidism of renal origin: Secondary | ICD-10-CM | POA: Diagnosis not present

## 2022-10-11 DIAGNOSIS — D509 Iron deficiency anemia, unspecified: Secondary | ICD-10-CM | POA: Diagnosis not present

## 2022-10-11 DIAGNOSIS — N186 End stage renal disease: Secondary | ICD-10-CM | POA: Diagnosis not present

## 2022-10-11 DIAGNOSIS — D688 Other specified coagulation defects: Secondary | ICD-10-CM | POA: Diagnosis not present

## 2022-10-11 DIAGNOSIS — E1129 Type 2 diabetes mellitus with other diabetic kidney complication: Secondary | ICD-10-CM | POA: Diagnosis not present

## 2022-10-11 DIAGNOSIS — Z992 Dependence on renal dialysis: Secondary | ICD-10-CM | POA: Diagnosis not present

## 2022-10-14 DIAGNOSIS — D688 Other specified coagulation defects: Secondary | ICD-10-CM | POA: Diagnosis not present

## 2022-10-14 DIAGNOSIS — N2581 Secondary hyperparathyroidism of renal origin: Secondary | ICD-10-CM | POA: Diagnosis not present

## 2022-10-14 DIAGNOSIS — Z992 Dependence on renal dialysis: Secondary | ICD-10-CM | POA: Diagnosis not present

## 2022-10-14 DIAGNOSIS — N186 End stage renal disease: Secondary | ICD-10-CM | POA: Diagnosis not present

## 2022-10-14 DIAGNOSIS — E1129 Type 2 diabetes mellitus with other diabetic kidney complication: Secondary | ICD-10-CM | POA: Diagnosis not present

## 2022-10-14 DIAGNOSIS — D509 Iron deficiency anemia, unspecified: Secondary | ICD-10-CM | POA: Diagnosis not present

## 2022-10-16 DIAGNOSIS — D688 Other specified coagulation defects: Secondary | ICD-10-CM | POA: Diagnosis not present

## 2022-10-16 DIAGNOSIS — Z992 Dependence on renal dialysis: Secondary | ICD-10-CM | POA: Diagnosis not present

## 2022-10-16 DIAGNOSIS — E1129 Type 2 diabetes mellitus with other diabetic kidney complication: Secondary | ICD-10-CM | POA: Diagnosis not present

## 2022-10-16 DIAGNOSIS — D509 Iron deficiency anemia, unspecified: Secondary | ICD-10-CM | POA: Diagnosis not present

## 2022-10-16 DIAGNOSIS — N2581 Secondary hyperparathyroidism of renal origin: Secondary | ICD-10-CM | POA: Diagnosis not present

## 2022-10-16 DIAGNOSIS — N186 End stage renal disease: Secondary | ICD-10-CM | POA: Diagnosis not present

## 2022-10-18 DIAGNOSIS — E1129 Type 2 diabetes mellitus with other diabetic kidney complication: Secondary | ICD-10-CM | POA: Diagnosis not present

## 2022-10-18 DIAGNOSIS — Z992 Dependence on renal dialysis: Secondary | ICD-10-CM | POA: Diagnosis not present

## 2022-10-18 DIAGNOSIS — D688 Other specified coagulation defects: Secondary | ICD-10-CM | POA: Diagnosis not present

## 2022-10-18 DIAGNOSIS — D509 Iron deficiency anemia, unspecified: Secondary | ICD-10-CM | POA: Diagnosis not present

## 2022-10-18 DIAGNOSIS — N186 End stage renal disease: Secondary | ICD-10-CM | POA: Diagnosis not present

## 2022-10-18 DIAGNOSIS — N2581 Secondary hyperparathyroidism of renal origin: Secondary | ICD-10-CM | POA: Diagnosis not present

## 2022-10-20 DIAGNOSIS — Z992 Dependence on renal dialysis: Secondary | ICD-10-CM | POA: Diagnosis not present

## 2022-10-20 DIAGNOSIS — N186 End stage renal disease: Secondary | ICD-10-CM | POA: Diagnosis not present

## 2022-10-20 DIAGNOSIS — I129 Hypertensive chronic kidney disease with stage 1 through stage 4 chronic kidney disease, or unspecified chronic kidney disease: Secondary | ICD-10-CM | POA: Diagnosis not present

## 2022-10-21 DIAGNOSIS — Z992 Dependence on renal dialysis: Secondary | ICD-10-CM | POA: Diagnosis not present

## 2022-10-21 DIAGNOSIS — D631 Anemia in chronic kidney disease: Secondary | ICD-10-CM | POA: Diagnosis not present

## 2022-10-21 DIAGNOSIS — N2581 Secondary hyperparathyroidism of renal origin: Secondary | ICD-10-CM | POA: Diagnosis not present

## 2022-10-21 DIAGNOSIS — D688 Other specified coagulation defects: Secondary | ICD-10-CM | POA: Diagnosis not present

## 2022-10-21 DIAGNOSIS — E1129 Type 2 diabetes mellitus with other diabetic kidney complication: Secondary | ICD-10-CM | POA: Diagnosis not present

## 2022-10-21 DIAGNOSIS — D509 Iron deficiency anemia, unspecified: Secondary | ICD-10-CM | POA: Diagnosis not present

## 2022-10-21 DIAGNOSIS — N186 End stage renal disease: Secondary | ICD-10-CM | POA: Diagnosis not present

## 2022-10-22 ENCOUNTER — Ambulatory Visit (HOSPITAL_COMMUNITY)
Admission: RE | Admit: 2022-10-22 | Discharge: 2022-10-22 | Disposition: A | Discharge status: Home / Self Care | Payer: Medicare Other | Source: Ambulatory Visit | Attending: Vascular Surgery | Admitting: Vascular Surgery

## 2022-10-22 ENCOUNTER — Ambulatory Visit (INDEPENDENT_AMBULATORY_CARE_PROVIDER_SITE_OTHER): Payer: Medicare Other | Admitting: Vascular Surgery

## 2022-10-22 ENCOUNTER — Encounter: Payer: Self-pay | Admitting: Vascular Surgery

## 2022-10-22 VITALS — BP 150/83 | HR 74 | Temp 98.0°F | Resp 16 | Ht 68.0 in | Wt 297.0 lb

## 2022-10-22 DIAGNOSIS — Z992 Dependence on renal dialysis: Secondary | ICD-10-CM

## 2022-10-22 DIAGNOSIS — N186 End stage renal disease: Secondary | ICD-10-CM

## 2022-10-22 NOTE — Progress Notes (Signed)
Patient name: Jeremy [name]. MRN: [id number] DOB: 1966/02/10 Sex: male  REASON FOR CONSULT: Evaluate AVF  HPI: Dmarcus Alison. is a 57 y.o. male, with end-stage renal disease that presents for evaluation of his left arm AV fistula.  He most recently underwent ligation of his left radiocephalic AV fistula given this was occluded in the upper arm and was non-functional with creation of a left first stage basilic vein fistula on A999333.  He was seen by her PA a month ago and this was still small so he presents for another interval duplex.  He is using a right IJ TDC.  On dialysis Monday Wednesday Friday.  Past Medical History:  Diagnosis Date   Abscess    AKI (acute kidney injury) 08/05/2017   Bell's palsy    right eye abn since dx bell's palsy   CKD (chronic kidney disease)    Dr. McDiarmind    Dyspnea    occasional with exertion   ESRD (end stage renal disease)    MWF -[facility]   Headache    High cholesterol    Hypertension    Left shoulder pain 10/26/2013   S/p injection on 10/26/13    Renal insufficiency 02/14/2014   Seizures 08/04/2017   "related to high blood sugars" (08/05/2017)   Type II diabetes mellitus     Past Surgical History:  Procedure Laterality Date   A/V FISTULAGRAM Left 07/25/2022   Procedure: A/V Fistulagram;  Surgeon: [name], MD;  Location: [facility];  Service: Cardiovascular;  Laterality: Left;   AV FISTULA PLACEMENT Left 11/11/2019   Procedure: LEFT FOREARM RADIOCEPHALIC ARTERIOVENOUS (AV) FISTULA CREATION;  Surgeon: [name], MD;  Location: [facility];  Service: Vascular;  Laterality: Left;   AV FISTULA PLACEMENT Left 08/14/2022   Procedure: FIRST STAGE BRACHIOBASILIC LEFT ARM ARTERIOVENOUS (AV) FISTULA CREATION;  Surgeon: [name], MD;  Location: [facility];  Service: Vascular;  Laterality: Left;   Eyelid surgery Right    I & D EXTREMITY  11/11/2011   Procedure: IRRIGATION AND DEBRIDEMENT EXTREMITY;  Surgeon:  Merrie Roof, MD;  Location: [facility];  Service: General;  Laterality: Right;  I&D Right Thigh Abscess   INSERTION OF DIALYSIS CATHETER N/A 07/25/2022   Procedure: INSERTION OF DIALYSIS CATHETER;  Surgeon: [name], MD;  Location: [facility];  Service: Vascular;  Laterality: N/A;   IR FLUORO GUIDE CV LINE RIGHT  08/12/2019   IR US GUIDE VASC ACCESS RIGHT  08/12/2019   LIGATION OF ARTERIOVENOUS  FISTULA Left 08/14/2022   Procedure: LIGATION OF BRACHIOCEPHALIC ARTERIOVENOUS  FISTULA;  Surgeon: [name], MD;  Location: [facility];  Service: Vascular;  Laterality: Left;   REVISON OF ARTERIOVENOUS FISTULA Left 03/23/2020   Procedure: LEFT ARM ARTERIOVENOUS FISTULA REVISON, LIGATION OF SIDE BRANCHES AND ELEVATION;  Surgeon: [name], MD;  Location: [facility];  Service: Vascular;  Laterality: Left;    Family History  Problem Relation Age of Onset   Diabetes Mother    Heart disease Mother 43   Diabetes Sister    Diabetes Brother    Diabetes Brother    Cancer Maternal Uncle        type unknown   Seizures Niece    Stroke Neg Hx     SOCIAL HISTORY: Social History   Socioeconomic History   Marital status: Divorced    Spouse name: Not on file   Number of children: 3   Years of education:  Not on file   Highest education level: Not on file  Occupational History   Occupation: maintenance tech   Occupation: Disablity  Tobacco Use   Smoking status: Never    Passive exposure: Never   Smokeless tobacco: Never  Vaping Use   Vaping Use: Never used  Substance and Sexual Activity   Alcohol use: Not Currently   Drug use: No   Sexual activity: Yes  Other Topics Concern   Not on file  Social History Narrative   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  Did not graduate high school.  Has 3 adult children in [place].  Divorced.  Lives with girlfriend [name].                   Social Determinants of Health   Financial Resource Strain:  Not on file  Food Insecurity: Not on file  Transportation Needs: Not on file  Physical Activity: Unknown (08/31/2019)   Exercise Vital Sign    Days of Exercise per Week: 2 days    Minutes of Exercise per Session: Not on file  Stress: Not on file  Social Connections: Not on file  Intimate Partner Violence: Not on file    Allergies  Allergen Reactions   Amlodipine Other (See Comments)    Dizziness    Current Outpatient Medications  Medication Sig Dispense Refill   amLODipine (NORVASC) 5 MG tablet Take 1 tablet (5 mg total) by mouth daily. 90 tablet 1   aspirin EC (ASPIRIN LOW DOSE) 81 MG tablet Take 1 tablet (81 mg total) by mouth daily. 90 tablet 0   Blood Glucose Monitoring Suppl (CONTOUR NEXT EZ) w/Device KIT Use to check blood sugar three times daily. 1 kit 0   Continuous Blood Gluc Receiver (FREESTYLE LIBRE 2 READER) DEVI Use to check blood sugar three times daily. 1 each 0   Continuous Blood Gluc Sensor (FREESTYLE LIBRE 2 SENSOR) MISC Use to check blood sugar three times daily. 2 each 3   diclofenac Sodium (VOLTAREN) 1 % GEL Apply 4 g topically 4 (four) times daily. 200 g 1   Doxercalciferol (HECTOROL IV) Doxercalciferol (Hectorol)     enalapril (VASOTEC) 5 MG tablet Take 1 tablet (5 mg total) by mouth daily. 90 tablet 1   glucose blood (CONTOUR NEXT TEST) test strip Use to check blood sugar three times daily. 100 each 12   HYDROcodone-acetaminophen (NORCO) 5-325 MG tablet Take 1 tablet by mouth every 6 (six) hours as needed for moderate pain. 12 tablet 0   insulin glargine (LANTUS SOLOSTAR) 100 UNIT/ML Solostar Pen Inject 40 Units into the skin daily. After 1 week, may increase to 50 units daily if blood sugar at home is  above 200. 30 mL 2   insulin lispro (HUMALOG KWIKPEN) 100 UNIT/ML KwikPen Inject 6 Units into the skin 3 (three) times daily with meals. 15 mL 11   Insulin Pen Needle (B-D ULTRAFINE III SHORT PEN) 31G X 8 MM MISC 1 application  by Does not apply route 3 (three)  times daily. E11.65 200 each 11   Lancet Devices (MICROLET NEXT LANCING DEVICE) MISC 1 Device by Does not apply route 3 (three) times daily. 1 each 11   levETIRAcetam (KEPPRA XR) 500 MG 24 hr tablet Take 1 tablet (500 mg total) by mouth in the morning and at bedtime. 180 tablet 1   Microlet Lancets MISC Use to check blood sugar three times daily. E11.65 100 each 3   VELPHORO 500  MG chewable tablet Chew 500 mg by mouth 3 (three) times daily with meals.     atorvastatin (LIPITOR) 40 MG tablet Take 1 tablet (40 mg total) by mouth daily. (Patient not taking: Reported on 08/20/2022) 90 tablet 3   carvedilol (COREG) 3.125 MG tablet Take 1 tablet (3.125 mg total) by mouth 2 (two) times daily with a meal. 180 tablet 0   Current Facility-Administered Medications  Medication Dose Route Frequency Provider Last Rate Last Admin   0.9 %  sodium chloride infusion  250 mL Intravenous PRN [name], MD       sodium chloride flush (NS) 0.9 % injection 3 mL  3 mL Intravenous Q12H [name], MD        REVIEW OF SYSTEMS:  [X]  denotes positive finding, [ ]  denotes negative finding Cardiac  Comments:  Chest pain or chest pressure:    Shortness of breath upon exertion:    Short of breath when lying flat:    Irregular heart rhythm:        Vascular    Pain in calf, thigh, or hip brought on by ambulation:    Pain in feet at night that wakes you up from your sleep:     Blood clot in your veins:    Leg swelling:         Pulmonary    Oxygen at home:    Productive cough:     Wheezing:         Neurologic    Sudden weakness in arms or legs:     Sudden numbness in arms or legs:     Sudden onset of difficulty speaking or slurred speech:    Temporary loss of vision in one eye:     Problems with dizziness:         Gastrointestinal    Blood in stool:     Vomited blood:         Genitourinary    Burning when urinating:     Blood in urine:        Psychiatric    Major depression:          Hematologic    Bleeding problems:    Problems with blood clotting too easily:        Skin    Rashes or ulcers:        Constitutional    Fever or chills:      PHYSICAL EXAM: Vitals:   10/22/22 0919  BP: (!) 150/83  Pulse: 74  Resp: 16  Temp: 98 F (36.7 C)  TempSrc: Temporal  SpO2: 93%  Weight: 297 lb (134.7 kg)  Height: 5\' 8"  (1.727 m)    GENERAL: The patient is a well-nourished male, in no acute distress. The vital signs are documented above. CARDIAC: There is a regular rate and rhythm.  VASCULAR:  Left basilic vein fistula with excellent thrill Left radial pulse palpable No thrill in previously ligated left radiocephalic AVF PULMONARY: No respiratory distress. ABDOMEN: Soft and non-tender. MUSCULOSKELETAL: There are no major deformities or cyanosis. NEUROLOGIC: No focal weakness or paresthesias are detected. PSYCHIATRIC: The patient has a normal affect.  DATA:   DIALYSIS ACCESS  Patient Name:  Clem Marken.  Date of Exam:   10/22/2022 Medical Rec #: [id number]          Accession #:    [id number] Date of Birth: 1965-12-22          Patient Gender: M Patient Age:  56 years Exam [facility] Procedure:      VAS US DUPLEX DIALYSIS ACCESS (AVF, AVG) Referring Phys: Monica Martinez   --------------------------------------------------------------------------- -----   Reason for Exam: Non-maturation of AVF.  Access Site: Left Upper Extremity.  Access Type: Basilic vein transposition.  History: 08/14/22 Left 1st stage BVT. Slow to mature.  Performing Technologist: Ralene Cork RVT    Examination Guidelines: A complete evaluation includes B-mode imaging, spectral Doppler, color Doppler, and power Doppler as needed of all accessible portions of each vessel. Unilateral testing is considered an integral part of a complete examination. Limited examinations for reoccurring indications may be performed as noted.     Findings: +--------------------+----------+-----------------+--------+ AVF                 PSV (cm/s)Flow Vol (mL/min)Comments +--------------------+----------+-----------------+--------+ Native artery inflow   152          1096                +--------------------+----------+-----------------+--------+ AVF Anastomosis        357                              +--------------------+----------+-----------------+--------+    +------------+----------+-------------+----------+--------+ OUTFLOW VEINPSV (cm/s)Diameter (cm)Depth (cm)Describe +------------+----------+-------------+----------+--------+ Prox UA        101        0.78        2.28            +------------+----------+-------------+----------+--------+ Mid UA          93        0.75        1.47            +------------+----------+-------------+----------+--------+ Dist UA        299        0.68        0.86            +------------+----------+-------------+----------+--------+ AC Fossa       711        0.33                        +------------+----------+-------------+----------+--------+     Summary: Patent left BVT. Outflow vein diameter <3mm at the antecubitus with non hemydynamically significant increase in velocity.   *See table(s) above for measurements and observations.   Diagnosing physician: Monica Martinez MD Electronically signed by Monica Martinez MD on 10/22/2022 at 10:25:52 AM.      Assessment/Plan:  57 y.o. male, with end-stage renal disease that presents for evaluation of his left arm AV fistula.  He most recently underwent ligation of his left radiocephalic AV fistula given this was occluded in the upper arm and was non-functional with creation of a left first stage basilic vein fistula on A999333.   I discussed that his left arm basilic vein fistula duplex looks better today on interval imaging.  The vein appears to measure 6-7 mm.  He has an excellent thrill  on exam.  There are great flow volumes.  I have recommended a left second stage basilic vein transposition.  Risks benefits discussed.  Hopefully after a month following surgery we can get his [facility] removed.  Will get schedule next week at his convenience.   [name], MD [facility] Office: [phone]

## 2022-10-23 ENCOUNTER — Other Ambulatory Visit: Payer: Self-pay | Admitting: *Deleted

## 2022-10-23 DIAGNOSIS — D509 Iron deficiency anemia, unspecified: Secondary | ICD-10-CM | POA: Diagnosis not present

## 2022-10-23 DIAGNOSIS — N186 End stage renal disease: Secondary | ICD-10-CM

## 2022-10-23 DIAGNOSIS — D631 Anemia in chronic kidney disease: Secondary | ICD-10-CM | POA: Diagnosis not present

## 2022-10-23 DIAGNOSIS — D688 Other specified coagulation defects: Secondary | ICD-10-CM | POA: Diagnosis not present

## 2022-10-23 DIAGNOSIS — Z992 Dependence on renal dialysis: Secondary | ICD-10-CM | POA: Diagnosis not present

## 2022-10-23 DIAGNOSIS — E1129 Type 2 diabetes mellitus with other diabetic kidney complication: Secondary | ICD-10-CM | POA: Diagnosis not present

## 2022-10-23 DIAGNOSIS — N2581 Secondary hyperparathyroidism of renal origin: Secondary | ICD-10-CM | POA: Diagnosis not present

## 2022-10-24 ENCOUNTER — Encounter: Payer: Self-pay | Admitting: Pharmacist

## 2022-10-24 ENCOUNTER — Ambulatory Visit: Payer: Medicare Other | Attending: Internal Medicine | Admitting: Pharmacist

## 2022-10-24 ENCOUNTER — Other Ambulatory Visit: Payer: Self-pay

## 2022-10-24 DIAGNOSIS — E1151 Type 2 diabetes mellitus with diabetic peripheral angiopathy without gangrene: Secondary | ICD-10-CM | POA: Diagnosis not present

## 2022-10-24 DIAGNOSIS — E782 Mixed hyperlipidemia: Secondary | ICD-10-CM | POA: Diagnosis not present

## 2022-10-24 DIAGNOSIS — E1169 Type 2 diabetes mellitus with other specified complication: Secondary | ICD-10-CM | POA: Diagnosis not present

## 2022-10-24 DIAGNOSIS — I10 Essential (primary) hypertension: Secondary | ICD-10-CM | POA: Diagnosis not present

## 2022-10-24 DIAGNOSIS — Z794 Long term (current) use of insulin: Secondary | ICD-10-CM

## 2022-10-24 MED ORDER — LANTUS SOLOSTAR 100 UNIT/ML ~~LOC~~ SOPN
50.0000 [IU] | PEN_INJECTOR | Freq: Every day | SUBCUTANEOUS | 1 refills | Status: DC
Start: 2022-10-24 — End: 2023-06-16
  Filled 2022-10-24: qty 45, 90d supply, fill #0
  Filled 2023-02-04: qty 45, 90d supply, fill #1

## 2022-10-24 MED ORDER — ATORVASTATIN CALCIUM 40 MG PO TABS
40.0000 mg | ORAL_TABLET | Freq: Every day | ORAL | 3 refills | Status: DC
Start: 2022-10-24 — End: 2023-08-26

## 2022-10-24 MED ORDER — ENALAPRIL MALEATE 5 MG PO TABS
5.0000 mg | ORAL_TABLET | Freq: Every day | ORAL | 1 refills | Status: AC
Start: 2022-10-24 — End: ?

## 2022-10-24 NOTE — Progress Notes (Signed)
S:     No chief complaint on file.  57 y.o. male who presents for diabetes evaluation, education, and management.  PMH is significant for T2DM w/ ESRD (MWF HD), anemia of chronic disease, resistant HTN, OSA, HLD, obesity, epilepsy. Patient was referred by Freeman Caldron on 05/16/2022. A1c at that visit 13.4%  (up from 11.5% previously). He was found to be non-adherent with medications. Mr. Liebau was seen by me on 08/06/2022 for blood pressure management. At that visit, he reported his blood pressure was dropping at dialysis and no changes to blood pressure medications were made. Lantus was increased and a prescription for Elenor Legato was sent.   Today, patient arrives in good spirits and presents without any assistance.  Mr. Loy tells me today that his blood pressure has been dropping at dialysis and he has stopped taking all of his blood pressure medications. He did report drinking water and vinegar for blood pressure support. On non-dialysis days, he does not check his blood pressure at home. Regarding his diabetes, he reports feeling low and carrying candies with him. He is taking Lantus 50 units and Humalog 5 or 6 units before meals. He reports skipping Humalog if he is not eating or will be outside doing yard work. He picked up the [place] 2 from the pharmacy, but has not set it up yet.   Family/Social History:  -Fhx: DM, heart disease, seizure disorder, stroke -Tobacco: never smoker  -Alcohol: none reported   Current diabetes medications include: Lantus 50 units daily, Humalog 5 or 6 units TID Current hypertension medications include: amlodipine 5 mg daily (not taking), carvedilol 3.125 mg BID (not taking), enalapril 5 mg daily (not taking)  Insurance coverage: [place]  Patient endorses hypoglycemic events and treats appropriately.  Reported home fasting blood sugars: not checking consistently. Gives range in the 90-200s.  Typically only checks BG in AM  Patient denies nocturia  (nighttime urination).  Patient reports neuropathy (nerve pain). Patient denies visual changes. Patient reports self foot exams.   Patient reported no changes from previous dietary habits:  -Non-adherent to sodium restriction. "Still in that salt game" -Drinks regular Pepsi and [place]. Dew daily.  -Does not limit carbohydrates but tries to avoid sweets. -Meals: does not eat at regular intervals.   Patient-reported exercise habits: walking and yard work  O:   7 day average blood glucose: no meter with him. No CGM in place.  Lab Results  Component Value Date   HGBA1C 11.9 (A) 08/20/2022   There were no vitals filed for this visit.   Lipid Panel     Component Value Date/Time   CHOL 261 (H) 02/04/2022 1129   TRIG 222 (H) 02/04/2022 1129   HDL 54 02/04/2022 1129   CHOLHDL 4.8 02/04/2022 1129   CHOLHDL 3.1 02/09/2016 1045   VLDL 11 02/09/2016 1045   LDLCALC 166 (H) 02/04/2022 1129   Clinical Atherosclerotic Cardiovascular Disease (ASCVD): No  The 10-year ASCVD risk score (Arnett DK, et al., 2019) is: 27.2%   Values used to calculate the score:     Age: 4 years     Sex: Male     Is Non-Hispanic African American: Yes     Diabetic: Yes     Tobacco smoker: No     Systolic Blood Pressure: Q000111Q mmHg     Is BP treated: Yes     HDL Cholesterol: 54 mg/dL     Total Cholesterol: 261 mg/dL   A/P: Diabetes longstanding currently uncontrolled. Patient is  able to verbalize appropriate hypoglycemia management plan. Medication adherence appears suboptimal.   Hypertension longstanding currently above goal. Blood pressure goal of <130/80 mmHg. Patient not taking any blood pressure medications at this time due to hypotension with dialysis. Consider midodrine in the future if BP continues to drop at dialysis.  -Re-start enalapril 5 mg daily.  -Stop amlodipine 5 mg daily.  -Stop carvedilol 3.125 mg BID.  There are a lot of changes I wish to make for him to simplify things. The problem is  medication cost and adherence in general. We will need to make small changes over time. Will continue to have him fill with our pharmacy.  - Continue Lantus 50 units daily. Instructed patient to decrease Lantus to 45 units daily in 1 week if able to set up CGM and notices low blood glucose readings.  -Patient has [place] supplies. Encouraged to visit pharmacy to assist with set-up.  -Patient educated on purpose, proper use, and potential adverse effects of Lantus.  -Extensively discussed pathophysiology of diabetes, recommended lifestyle interventions, dietary effects on blood sugar control.  -Counseled on s/sx of and management of hypoglycemia.  -Next A1c anticipated 11/2022.   Written patient instructions provided. Patient verbalized understanding of treatment plan.  Total time in face to face counseling 45 minutes.    Follow-up:  Pharmacist in 1 month. PCP clinic visit at beginning of May.  [facility], Pharm.[place] [phone]

## 2022-10-25 DIAGNOSIS — D688 Other specified coagulation defects: Secondary | ICD-10-CM | POA: Diagnosis not present

## 2022-10-25 DIAGNOSIS — N2581 Secondary hyperparathyroidism of renal origin: Secondary | ICD-10-CM | POA: Diagnosis not present

## 2022-10-25 DIAGNOSIS — D509 Iron deficiency anemia, unspecified: Secondary | ICD-10-CM | POA: Diagnosis not present

## 2022-10-25 DIAGNOSIS — D631 Anemia in chronic kidney disease: Secondary | ICD-10-CM | POA: Diagnosis not present

## 2022-10-25 DIAGNOSIS — Z992 Dependence on renal dialysis: Secondary | ICD-10-CM | POA: Diagnosis not present

## 2022-10-25 DIAGNOSIS — E1129 Type 2 diabetes mellitus with other diabetic kidney complication: Secondary | ICD-10-CM | POA: Diagnosis not present

## 2022-10-25 DIAGNOSIS — N186 End stage renal disease: Secondary | ICD-10-CM | POA: Diagnosis not present

## 2022-10-28 DIAGNOSIS — D631 Anemia in chronic kidney disease: Secondary | ICD-10-CM | POA: Diagnosis not present

## 2022-10-28 DIAGNOSIS — E1129 Type 2 diabetes mellitus with other diabetic kidney complication: Secondary | ICD-10-CM | POA: Diagnosis not present

## 2022-10-28 DIAGNOSIS — Z992 Dependence on renal dialysis: Secondary | ICD-10-CM | POA: Diagnosis not present

## 2022-10-28 DIAGNOSIS — D509 Iron deficiency anemia, unspecified: Secondary | ICD-10-CM | POA: Diagnosis not present

## 2022-10-28 DIAGNOSIS — D688 Other specified coagulation defects: Secondary | ICD-10-CM | POA: Diagnosis not present

## 2022-10-28 DIAGNOSIS — N2581 Secondary hyperparathyroidism of renal origin: Secondary | ICD-10-CM | POA: Diagnosis not present

## 2022-10-28 DIAGNOSIS — N186 End stage renal disease: Secondary | ICD-10-CM | POA: Diagnosis not present

## 2022-10-29 ENCOUNTER — Encounter (HOSPITAL_COMMUNITY): Payer: Self-pay | Admitting: Vascular Surgery

## 2022-10-29 ENCOUNTER — Other Ambulatory Visit: Payer: Self-pay

## 2022-10-29 ENCOUNTER — Telehealth: Payer: Self-pay

## 2022-10-29 NOTE — Progress Notes (Signed)
Spoke with pt for pre-op call. Pt denies cardiac history. Pt is treated for HTN, CKD and Diabetes. Pt's last A1C in Epic was 11.9 on 08/20/22. He states that he has had it done at dialysis and it was 9.? recently. He states his fasting blood sugar is usually between 95-134. Instructed pt to check his blood sugar when he wakes up in the AM and every 2 hours until he leaves for the hospital. If blood sugar is 70 or below, treat with 1/2 cup of clear juice (apple or cranberry) and recheck blood sugar 15 minutes after drinking juice. If blood sugar continues to be 70 or below, call the Short Stay department and ask to speak to a nurse. Instructed pt to take 1/2 of his regular dose of Lantus in the AM and not to take his Humalog insulin in the AM. He voiced understanding.   Pt expressed concern about his left hand being swollen and hurting since having dialysis yesterday. He said he dialysis was making him feel bad yesterday and he was frustrated that the staff kept trying to give him the full time. He states his hand is still swollen this AM. I instructed him to call Dr. Ophelia Charter office to notify them since he is having surgery on that left arm tomorrow.   Shower instructions given to pt and he voiced understanding.

## 2022-10-29 NOTE — Telephone Encounter (Signed)
Spoke to pt who has c/o of left hand swelling that he noticed yesterday; able to move his hand and use it without issue. He had spread seed/fertilizer over the weekend and then put on a glove. He does not think the two are related but wanted to let us know. He was unable to dialyze the full time yesterday due to low b/p and thinks now the swelling is from accumulation of fluid. He is not overly concerned he says, but wanted to let us know. MD is aware. Pt has been elevating his arm and will continue to do this periodically. He will call back should anything change.

## 2022-10-30 ENCOUNTER — Ambulatory Visit (HOSPITAL_BASED_OUTPATIENT_CLINIC_OR_DEPARTMENT_OTHER): Payer: Medicare Other | Admitting: Anesthesiology

## 2022-10-30 ENCOUNTER — Ambulatory Visit (HOSPITAL_COMMUNITY): Payer: Medicare Other | Admitting: Anesthesiology

## 2022-10-30 ENCOUNTER — Encounter (HOSPITAL_COMMUNITY)
Admission: RE | Disposition: A | Discharge status: Home / Self Care | Payer: Self-pay | Source: Home / Self Care | Attending: Vascular Surgery

## 2022-10-30 ENCOUNTER — Encounter (HOSPITAL_COMMUNITY): Payer: Self-pay | Admitting: Vascular Surgery

## 2022-10-30 ENCOUNTER — Ambulatory Visit (HOSPITAL_COMMUNITY)
Admission: RE | Admit: 2022-10-30 | Discharge: 2022-10-30 | Disposition: A | Discharge status: Home / Self Care | Payer: Medicare Other | Attending: Vascular Surgery | Admitting: Vascular Surgery

## 2022-10-30 ENCOUNTER — Other Ambulatory Visit: Payer: Self-pay

## 2022-10-30 DIAGNOSIS — N186 End stage renal disease: Secondary | ICD-10-CM

## 2022-10-30 DIAGNOSIS — I120 Hypertensive chronic kidney disease with stage 5 chronic kidney disease or end stage renal disease: Secondary | ICD-10-CM | POA: Insufficient documentation

## 2022-10-30 DIAGNOSIS — E7800 Pure hypercholesterolemia, unspecified: Secondary | ICD-10-CM | POA: Diagnosis not present

## 2022-10-30 DIAGNOSIS — I509 Heart failure, unspecified: Secondary | ICD-10-CM | POA: Diagnosis not present

## 2022-10-30 DIAGNOSIS — N185 Chronic kidney disease, stage 5: Secondary | ICD-10-CM | POA: Diagnosis not present

## 2022-10-30 DIAGNOSIS — Z794 Long term (current) use of insulin: Secondary | ICD-10-CM | POA: Insufficient documentation

## 2022-10-30 DIAGNOSIS — Z992 Dependence on renal dialysis: Secondary | ICD-10-CM | POA: Diagnosis not present

## 2022-10-30 DIAGNOSIS — I132 Hypertensive heart and chronic kidney disease with heart failure and with stage 5 chronic kidney disease, or end stage renal disease: Secondary | ICD-10-CM

## 2022-10-30 DIAGNOSIS — Z79899 Other long term (current) drug therapy: Secondary | ICD-10-CM | POA: Diagnosis not present

## 2022-10-30 DIAGNOSIS — E1122 Type 2 diabetes mellitus with diabetic chronic kidney disease: Secondary | ICD-10-CM | POA: Insufficient documentation

## 2022-10-30 HISTORY — PX: BASCILIC VEIN TRANSPOSITION: SHX5742

## 2022-10-30 LAB — POCT I-STAT, CHEM 8
BUN: 86 mg/dL — ABNORMAL HIGH (ref 6–20)
Calcium, Ion: 1.12 mmol/L — ABNORMAL LOW (ref 1.15–1.40)
Chloride: 105 mmol/L (ref 98–111)
Creatinine, Ser: 11.2 mg/dL — ABNORMAL HIGH (ref 0.61–1.24)
Glucose, Bld: 116 mg/dL — ABNORMAL HIGH (ref 70–99)
HCT: 39 % (ref 39.0–52.0)
Hemoglobin: 13.3 g/dL (ref 13.0–17.0)
Potassium: 5.1 mmol/L (ref 3.5–5.1)
Sodium: 140 mmol/L (ref 135–145)
TCO2: 26 mmol/L (ref 22–32)

## 2022-10-30 LAB — GLUCOSE, CAPILLARY
Glucose-Capillary: 112 mg/dL — ABNORMAL HIGH (ref 70–99)
Glucose-Capillary: 105 mg/dL — ABNORMAL HIGH (ref 70–99)
Glucose-Capillary: 81 mg/dL (ref 70–99)

## 2022-10-30 SURGERY — TRANSPOSITION, VEIN, BASILIC
Anesthesia: General | Laterality: Left

## 2022-10-30 MED ORDER — ACETAMINOPHEN 500 MG PO TABS: 1000.0000 mg | ORAL_TABLET | Freq: Once | ORAL | Status: AC

## 2022-10-30 MED ORDER — ONDANSETRON HCL 4 MG/2ML IJ SOLN: 4.0000 mg | Freq: Once | INTRAMUSCULAR | Status: DC | PRN

## 2022-10-30 MED ORDER — OXYCODONE HCL 5 MG PO TABS
5.0000 mg | ORAL_TABLET | Freq: Once | ORAL | Status: AC | PRN
  Administered 2022-10-30: 5 mg via ORAL

## 2022-10-30 MED ORDER — PHENYLEPHRINE HCL-NACL 20-0.9 MG/250ML-% IV SOLN
INTRAVENOUS | Status: DC | PRN
  Administered 2022-10-30: 50 ug/min via INTRAVENOUS

## 2022-10-30 MED ORDER — FENTANYL CITRATE (PF) 100 MCG/2ML IJ SOLN
25.0000 ug | INTRAMUSCULAR | Status: DC | PRN
  Administered 2022-10-30: 50 ug via INTRAVENOUS

## 2022-10-30 MED ORDER — LIDOCAINE HCL (PF) 1 % IJ SOLN
INTRAMUSCULAR | Status: AC
  Filled 2022-10-30: qty 30

## 2022-10-30 MED ORDER — HEMOSTATIC AGENTS (NO CHARGE) OPTIME
TOPICAL | Status: DC | PRN
  Administered 2022-10-30: 1 via TOPICAL

## 2022-10-30 MED ORDER — PHENYLEPHRINE 80 MCG/ML (10ML) SYRINGE FOR IV PUSH (FOR BLOOD PRESSURE SUPPORT)
PREFILLED_SYRINGE | INTRAVENOUS | Status: DC | PRN
  Administered 2022-10-30: 160 ug via INTRAVENOUS

## 2022-10-30 MED ORDER — MIDAZOLAM HCL 2 MG/2ML IJ SOLN
INTRAMUSCULAR | Status: DC | PRN
  Administered 2022-10-30 (×2): 1 mg via INTRAVENOUS

## 2022-10-30 MED ORDER — PHENYLEPHRINE 80 MCG/ML (10ML) SYRINGE FOR IV PUSH (FOR BLOOD PRESSURE SUPPORT)
PREFILLED_SYRINGE | INTRAVENOUS | Status: AC
  Filled 2022-10-30: qty 10

## 2022-10-30 MED ORDER — CHLORHEXIDINE GLUCONATE 4 % EX LIQD: 60.0000 mL | Freq: Once | CUTANEOUS | Status: DC

## 2022-10-30 MED ORDER — OXYCODONE HCL 5 MG/5ML PO SOLN: 5.0000 mg | Freq: Once | ORAL | Status: AC | PRN

## 2022-10-30 MED ORDER — 0.9 % SODIUM CHLORIDE (POUR BTL) OPTIME
TOPICAL | Status: DC | PRN
  Administered 2022-10-30: 1000 mL

## 2022-10-30 MED ORDER — ROCURONIUM BROMIDE 10 MG/ML (PF) SYRINGE
PREFILLED_SYRINGE | INTRAVENOUS | Status: AC
  Filled 2022-10-30: qty 10

## 2022-10-30 MED ORDER — PROPOFOL 10 MG/ML IV BOLUS
INTRAVENOUS | Status: AC
  Filled 2022-10-30: qty 20

## 2022-10-30 MED ORDER — ACETAMINOPHEN 500 MG PO TABS
ORAL_TABLET | ORAL | Status: AC
  Administered 2022-10-30: 1000 mg via ORAL
  Filled 2022-10-30: qty 2

## 2022-10-30 MED ORDER — FENTANYL CITRATE (PF) 250 MCG/5ML IJ SOLN
INTRAMUSCULAR | Status: DC | PRN
  Administered 2022-10-30: 100 ug via INTRAVENOUS
  Administered 2022-10-30: 50 ug via INTRAVENOUS
  Administered 2022-10-30: 25 ug via INTRAVENOUS

## 2022-10-30 MED ORDER — CHLORHEXIDINE GLUCONATE 0.12 % MT SOLN
15.0000 mL | OROMUCOSAL | Status: AC
  Filled 2022-10-30: qty 15

## 2022-10-30 MED ORDER — LIDOCAINE 2% (20 MG/ML) 5 ML SYRINGE
INTRAMUSCULAR | Status: AC
  Filled 2022-10-30: qty 5

## 2022-10-30 MED ORDER — CEFAZOLIN IN SODIUM CHLORIDE 3-0.9 GM/100ML-% IV SOLN
3.0000 g | INTRAVENOUS | Status: AC
  Administered 2022-10-30: 3 g via INTRAVENOUS

## 2022-10-30 MED ORDER — HEPARIN 6000 UNIT IRRIGATION SOLUTION
Status: DC | PRN
  Administered 2022-10-30: 1

## 2022-10-30 MED ORDER — HEPARIN SODIUM (PORCINE) 1000 UNIT/ML IJ SOLN
INTRAMUSCULAR | Status: DC | PRN
  Administered 2022-10-30: 5000 [IU] via INTRAVENOUS

## 2022-10-30 MED ORDER — LIDOCAINE 2% (20 MG/ML) 5 ML SYRINGE
INTRAMUSCULAR | Status: DC | PRN
  Administered 2022-10-30: 100 mg via INTRAVENOUS

## 2022-10-30 MED ORDER — ACETAMINOPHEN 500 MG PO TABS
1000.0000 mg | ORAL_TABLET | Freq: Once | ORAL | Status: DC
Start: 2022-10-30 — End: 2022-10-30

## 2022-10-30 MED ORDER — MIDAZOLAM HCL 2 MG/2ML IJ SOLN
INTRAMUSCULAR | Status: AC
  Filled 2022-10-30: qty 2

## 2022-10-30 MED ORDER — INSULIN ASPART 100 UNIT/ML IJ SOLN: 0.0000 [IU] | INTRAMUSCULAR | Status: DC | PRN

## 2022-10-30 MED ORDER — SODIUM CHLORIDE 0.9 % IV SOLN: INTRAVENOUS | Status: DC

## 2022-10-30 MED ORDER — PROPOFOL 10 MG/ML IV BOLUS
INTRAVENOUS | Status: DC | PRN
  Administered 2022-10-30: 200 mg via INTRAVENOUS
  Administered 2022-10-30: 100 mg via INTRAVENOUS

## 2022-10-30 MED ORDER — ROCURONIUM BROMIDE 100 MG/10ML IV SOLN
INTRAVENOUS | Status: DC | PRN
  Administered 2022-10-30: 100 mg via INTRAVENOUS

## 2022-10-30 MED ORDER — FENTANYL CITRATE (PF) 250 MCG/5ML IJ SOLN
INTRAMUSCULAR | Status: AC
  Filled 2022-10-30: qty 5

## 2022-10-30 MED ORDER — FENTANYL CITRATE (PF) 100 MCG/2ML IJ SOLN
INTRAMUSCULAR | Status: AC
  Filled 2022-10-30: qty 2

## 2022-10-30 MED ORDER — HYDROCODONE-ACETAMINOPHEN 5-325 MG PO TABS: 1.0000 | ORAL_TABLET | Freq: Four times a day (QID) | ORAL | 0 refills | Status: DC | PRN

## 2022-10-30 MED ORDER — HEPARIN 6000 UNIT IRRIGATION SOLUTION
Status: AC
  Filled 2022-10-30: qty 500

## 2022-10-30 MED ORDER — OXYCODONE HCL 5 MG PO TABS
ORAL_TABLET | ORAL | Status: AC
  Filled 2022-10-30: qty 1

## 2022-10-30 MED ORDER — PROTAMINE SULFATE 10 MG/ML IV SOLN
INTRAVENOUS | Status: DC | PRN
  Administered 2022-10-30: 20 mg via INTRAVENOUS

## 2022-10-30 MED ORDER — ONDANSETRON HCL 4 MG/2ML IJ SOLN
INTRAMUSCULAR | Status: AC
  Filled 2022-10-30: qty 2

## 2022-10-30 MED ORDER — SUGAMMADEX SODIUM 200 MG/2ML IV SOLN
INTRAVENOUS | Status: DC | PRN
  Administered 2022-10-30: 200 mg via INTRAVENOUS

## 2022-10-30 MED ORDER — GLYCOPYRROLATE PF 0.2 MG/ML IJ SOSY
PREFILLED_SYRINGE | INTRAMUSCULAR | Status: AC
  Filled 2022-10-30: qty 1

## 2022-10-30 MED ORDER — CHLORHEXIDINE GLUCONATE 0.12 % MT SOLN
OROMUCOSAL | Status: AC
  Administered 2022-10-30: 15 mL via OROMUCOSAL
  Filled 2022-10-30: qty 15

## 2022-10-30 MED ORDER — GLYCOPYRROLATE PF 0.2 MG/ML IJ SOSY
PREFILLED_SYRINGE | INTRAMUSCULAR | Status: DC | PRN
  Administered 2022-10-30: .2 mg via INTRAVENOUS

## 2022-10-30 MED ORDER — ONDANSETRON HCL 4 MG/2ML IJ SOLN
INTRAMUSCULAR | Status: DC | PRN
  Administered 2022-10-30: 4 mg via INTRAVENOUS

## 2022-10-30 MED ORDER — CEFAZOLIN IN SODIUM CHLORIDE 3-0.9 GM/100ML-% IV SOLN
INTRAVENOUS | Status: AC
  Filled 2022-10-30: qty 100

## 2022-10-30 SURGICAL SUPPLY — 48 items
ADH SKN CLS APL DERMABOND .7 (GAUZE/BANDAGES/DRESSINGS) ×1
AGENT HMST SPONGE THK3/8 (HEMOSTASIS)
ARMBAND PINK RESTRICT EXTREMIT (MISCELLANEOUS) ×1 IMPLANT
BAG COUNTER SPONGE SURGICOUNT (BAG) ×1 IMPLANT
BAG SPNG CNTER NS LX DISP (BAG) ×1
CANISTER SUCT 3000ML PPV (MISCELLANEOUS) ×1 IMPLANT
CLIP TI MEDIUM 24 (CLIP) ×1 IMPLANT
CLIP TI WIDE RED SMALL 24 (CLIP) ×1 IMPLANT
COVER PROBE W GEL 5X96 (DRAPES) ×1 IMPLANT
DERMABOND ADVANCED .7 DNX12 (GAUZE/BANDAGES/DRESSINGS) ×1 IMPLANT
ELECT REM PT RETURN 9FT ADLT (ELECTROSURGICAL) ×1
ELECTRODE REM PT RTRN 9FT ADLT (ELECTROSURGICAL) ×1 IMPLANT
GAUZE 4X4 16PLY ~~LOC~~+RFID DBL (SPONGE) IMPLANT
GLOVE BIO SURGEON STRL SZ7.5 (GLOVE) ×1 IMPLANT
GLOVE BIOGEL PI IND STRL 7.0 (GLOVE) IMPLANT
GLOVE BIOGEL PI IND STRL 8 (GLOVE) ×1 IMPLANT
GLOVE BIOGEL PI IND STRL 8.5 (GLOVE) IMPLANT
GLOVE PI ORTHO PRO STRL SZ7 (GLOVE) IMPLANT
GOWN STRL REUS W/ TWL LRG LVL3 (GOWN DISPOSABLE) ×2 IMPLANT
GOWN STRL REUS W/ TWL XL LVL3 (GOWN DISPOSABLE) ×2 IMPLANT
GOWN STRL REUS W/TWL LRG LVL3 (GOWN DISPOSABLE) ×2
GOWN STRL REUS W/TWL XL LVL3 (GOWN DISPOSABLE) ×2
HEMOSTAT SPONGE AVITENE ULTRA (HEMOSTASIS) IMPLANT
KIT BASIN OR (CUSTOM PROCEDURE TRAY) ×1 IMPLANT
KIT TURNOVER KIT B (KITS) ×1 IMPLANT
NDL HYPO 25GX1X1/2 BEV (NEEDLE) ×1 IMPLANT
NEEDLE HYPO 25GX1X1/2 BEV (NEEDLE) ×1 IMPLANT
NS IRRIG 1000ML POUR BTL (IV SOLUTION) ×1 IMPLANT
PACK CV ACCESS (CUSTOM PROCEDURE TRAY) ×1 IMPLANT
PAD ARMBOARD 7.5X6 YLW CONV (MISCELLANEOUS) ×2 IMPLANT
POWDER SURGICEL 3.0 GRAM (HEMOSTASIS) IMPLANT
SLING ARM FOAM STRAP LRG (SOFTGOODS) IMPLANT
SLING ARM FOAM STRAP MED (SOFTGOODS) IMPLANT
SPIKE FLUID TRANSFER (MISCELLANEOUS) ×1 IMPLANT
SUT MNCRL AB 4-0 PS2 18 (SUTURE) ×1 IMPLANT
SUT PROLENE 5 0 C 1 24 (SUTURE) IMPLANT
SUT PROLENE 6 0 BV (SUTURE) ×1 IMPLANT
SUT PROLENE 7 0 BV 1 (SUTURE) IMPLANT
SUT SILK 2 0 SH (SUTURE) IMPLANT
SUT SILK 3 0 (SUTURE) ×2
SUT SILK 3-0 18XBRD TIE 12 (SUTURE) IMPLANT
SUT VIC AB 2-0 CT1 27 (SUTURE) ×1
SUT VIC AB 2-0 CT1 TAPERPNT 27 (SUTURE) ×1 IMPLANT
SUT VIC AB 3-0 SH 27 (SUTURE) ×4
SUT VIC AB 3-0 SH 27X BRD (SUTURE) ×2 IMPLANT
TOWEL GREEN STERILE (TOWEL DISPOSABLE) ×1 IMPLANT
UNDERPAD 30X36 HEAVY ABSORB (UNDERPADS AND DIAPERS) ×1 IMPLANT
WATER STERILE IRR 1000ML POUR (IV SOLUTION) ×1 IMPLANT

## 2022-10-30 NOTE — Anesthesia Procedure Notes (Signed)
Procedure Name: Intubation Date/Time: 10/30/2022 10:55 AM  Performed by: Ayesha Rumpf, CRNAPre-anesthesia Checklist: Patient identified, Emergency Drugs available, Suction available and Patient being monitored Patient Re-evaluated:Patient Re-evaluated prior to induction Oxygen Delivery Method: Circle System Utilized Preoxygenation: Pre-oxygenation with 100% oxygen Induction Type: IV induction Ventilation: Mask ventilation without difficulty Laryngoscope Size: Glidescope and 4 Grade View: Grade I Tube type: Oral Tube size: 7.5 mm Number of attempts: 1 Airway Equipment and Method: Stylet and Oral airway Placement Confirmation: ETT inserted through vocal cords under direct vision, positive ETCO2 and breath sounds checked- equal and bilateral Secured at: 23 cm Tube secured with: Tape Dental Injury: Teeth and Oropharynx as per pre-operative assessment

## 2022-10-30 NOTE — Discharge Instructions (Signed)
   [facility]  Discharge Instructions  AV Fistula or Graft Surgery for Dialysis Access  Please refer to the following instructions for your post-procedure care. Your surgeon or physician assistant will discuss any changes with you.  Activity  You may drive the day following your surgery, if you are comfortable and no longer taking prescription pain medication. Resume full activity as the soreness in your incision resolves.  Bathing/Showering  You may shower after you go home. Keep your incision dry for 48 hours. Do not soak in a bathtub, hot tub, or swim until the incision heals completely. You may not shower if you have a hemodialysis catheter.  Incision Care  Clean your incision with mild soap and water after 48 hours. Pat the area dry with a clean towel. You do not need a bandage unless otherwise instructed. Do not apply any ointments or creams to your incision. You may have skin glue on your incision. Do not peel it off. It will come off on its own in about one week. Your arm may swell a bit after surgery. To reduce swelling use pillows to elevate your arm so it is above your heart. Your doctor will tell you if you need to lightly wrap your arm with an ACE bandage.  Diet  Resume your normal diet. There are not special food restrictions following this procedure. In order to heal from your surgery, it is CRITICAL to get adequate nutrition. Your body requires vitamins, minerals, and protein. Vegetables are the best source of vitamins and minerals. Vegetables also provide the perfect balance of protein. Processed food has little nutritional value, so try to avoid this.  Medications  Resume taking all of your medications. If your incision is causing pain, you may take over-the counter pain relievers such as acetaminophen (Tylenol). If you were prescribed a stronger pain medication, please be aware these medications can cause nausea and constipation. Prevent  nausea by taking the medication with a snack or meal. Avoid constipation by drinking plenty of fluids and eating foods with high amount of fiber, such as fruits, vegetables, and grains.  Do not take Tylenol if you are taking prescription pain medications.  Follow up Your surgeon may want to see you in the office following your access surgery. If so, this will be arranged at the time of your surgery.  Please call us immediately for any of the following conditions:  Increased pain, redness, drainage (pus) from your incision site Fever of 101 degrees or higher Severe or worsening pain at your incision site Hand pain or numbness.  Reduce your risk of vascular disease:  Stop smoking. If you would like help, call [facility] at 1-800-QUIT-NOW (630-341-7050) or [place] at 332 361 3445  Manage your cholesterol Maintain a desired weight Control your diabetes Keep your blood pressure down  Dialysis  It will take several weeks to several months for your new dialysis access to be ready for use. Your surgeon will determine when it is okay to use it. Your nephrologist will continue to direct your dialysis. You can continue to use your Permcath until your new access is ready for use.   10/30/2022 Jeremy [name]. [id number] [birth year]  Surgeon(s): Cephus Shelling, MD  Procedure(s): LEFT ARM SECOND STAGE BASILIC VEIN TRANSPOSITION  x Do not stick fistula for 6 weeks    If you have any questions, please call the office at [phone].

## 2022-10-30 NOTE — H&P (Signed)
History and Physical Interval Note:  10/30/2022 10:11 AM  Jeremy [name].  has presented today for surgery, with the diagnosis of ESRD.  The various methods of treatment have been discussed with the patient and family. After consideration of risks, benefits and other options for treatment, the patient has consented to  Procedure(s): LEFT ARM BASILIC VEIN TRANSPOSITION (Left) as a surgical intervention.  The patient's history has been reviewed, patient examined, no change in status, stable for surgery.  I have reviewed the patient's chart and labs.  Questions were answered to the patient's satisfaction.    Left arm 2nd stage BVT  Cephus Shelling     Patient name: Jeremy [name].       MRN: [id number]        DOB: Oct 03, 1965          Sex: male   REASON FOR CONSULT: Evaluate AVF   HPI: Jeremy [name]. is a 57 y.o. male, with end-stage renal disease that presents for evaluation of his left arm AV fistula.  He most recently underwent ligation of his left radiocephalic AV fistula given this was occluded in the upper arm and was non-functional with creation of a left first stage basilic vein fistula on 08/14/2022.  He was seen by her PA a month ago and this was still small so he presents for another interval duplex.  He is using a right IJ TDC.  On dialysis Monday Wednesday Friday.       Past Medical History:  Diagnosis Date   Abscess     AKI (acute kidney injury) 08/05/2017   Bell's palsy      right eye abn since dx bell's palsy   CKD (chronic kidney disease)      Dr. McDiarmind    Dyspnea      occasional with exertion   ESRD (end stage renal disease)      MWF -[facility]   Headache     High cholesterol     Hypertension     Left shoulder pain 10/26/2013    S/p injection on 10/26/13    Renal insufficiency 02/14/2014   Seizures 08/04/2017    "related to high blood sugars" (08/05/2017)   Type II diabetes mellitus             Past Surgical History:  Procedure Laterality Date    A/V FISTULAGRAM Left 07/25/2022    Procedure: A/V Fistulagram;  Surgeon: Cephus Shelling, MD;  Location: [facility];  Service: Cardiovascular;  Laterality: Left;   AV FISTULA PLACEMENT Left 11/11/2019    Procedure: LEFT FOREARM RADIOCEPHALIC ARTERIOVENOUS (AV) FISTULA CREATION;  Surgeon: Cephus Shelling, MD;  Location: [facility] OR;  Service: Vascular;  Laterality: Left;   AV FISTULA PLACEMENT Left 08/14/2022    Procedure: FIRST STAGE BRACHIOBASILIC LEFT ARM ARTERIOVENOUS (AV) FISTULA CREATION;  Surgeon: Cephus Shelling, MD;  Location: [facility] OR;  Service: Vascular;  Laterality: Left;   Eyelid surgery Right     I & D EXTREMITY   11/11/2011    Procedure: IRRIGATION AND DEBRIDEMENT EXTREMITY;  Surgeon: Robyne Askew, MD;  Location: [facility] OR;  Service: General;  Laterality: Right;  I&D Right Thigh Abscess   INSERTION OF DIALYSIS CATHETER N/A 07/25/2022    Procedure: INSERTION OF DIALYSIS CATHETER;  Surgeon: Cephus Shelling, MD;  Location: [facility];  Service: Vascular;  Laterality: N/A;   IR FLUORO GUIDE CV LINE RIGHT   08/12/2019   IR US GUIDE VASC ACCESS RIGHT  08/12/2019   LIGATION OF ARTERIOVENOUS  FISTULA Left 08/14/2022    Procedure: LIGATION OF BRACHIOCEPHALIC ARTERIOVENOUS  FISTULA;  Surgeon: Cephus Shellinglark, Leary Mcnulty J, MD;  [facility];  Service: Vascular;  Laterality: Left;   REVISON OF ARTERIOVENOUS FISTULA Left 03/23/2020    Procedure: LEFT ARM ARTERIOVENOUS FISTULA REVISON, LIGATION OF SIDE BRANCHES AND ELEVATION;  Surgeon: Cephus Shellinglark, Madalina Rosman J, MD;  Location: [facility] OR;  Service: Vascular;  Laterality: Left;           Family History  Problem Relation Age of Onset   Diabetes Mother     Heart disease Mother 3667   Diabetes Sister     Diabetes Brother     Diabetes Brother     Cancer Maternal Uncle          type unknown   Seizures Niece     Stroke Neg Hx        SOCIAL HISTORY: Social History         Socioeconomic History   Marital status: Divorced      Spouse name: Not on  file   Number of children: 3   Years of education: Not on file   Highest education level: Not on file  Occupational History   Occupation: maintenance tech   Occupation: Disabl[employer]  Tobacco Use   Smoking status: Never      Passive exposure: Never   Smokeless tobacco: Never  Vaping Use   Vaping Use: Never used  Substance and Sexual Activity   Alcohol use: Not Currently   Drug use: No   Sexual activity: Yes  Other Topics Concern   Not on file  Social History Narrative    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  Did not graduate high school.  Has 3 adult children in [place].  Divorced.  Lives with girlfriend Emilio Math([name]).                              Social Determinants of Health        Financial Resource Strain: Not on file  Food Insecurity: Not on file  Transportation Needs: Not on file  Physical Activity: Unknown (08/31/2019)    Exercise Vital Sign     Days of Exercise per Week: 2 days     Minutes of Exercise per Session: Not on file  Stress: Not on file  Social Connections: Not on file  Intimate Partner Violence: Not on file           Allergies  Allergen Reactions   Amlodipine Other (See Comments)      Dizziness            Current Outpatient Medications  Medication Sig Dispense Refill   amLODipine (NORVASC) 5 MG tablet Take 1 tablet (5 mg total) by mouth daily. 90 tablet 1   aspirin EC (ASPIRIN LOW DOSE) 81 MG tablet Take 1 tablet (81 mg total) by mouth daily. 90 tablet 0   Blood Glucose Monitoring Suppl (CONTOUR NEXT EZ) w/Device KIT Use to check blood sugar three times daily. 1 kit 0   Continuous Blood Gluc Receiver (FREESTYLE LIBRE 2 READER) DEVI Use to check blood sugar three times daily. 1 each 0   Continuous Blood Gluc Sensor (FREESTYLE LIBRE 2 SENSOR) MISC Use to check blood sugar three times daily. 2 each 3   diclofenac Sodium (VOLTAREN) 1 % GEL Apply 4 g topically 4 (four) times daily. 200 g 1  Doxercalciferol (HECTOROL  IV) Doxercalciferol (Hectorol)       enalapril (VASOTEC) 5 MG tablet Take 1 tablet (5 mg total) by mouth daily. 90 tablet 1   glucose blood (CONTOUR NEXT TEST) test strip Use to check blood sugar three times daily. 100 each 12   HYDROcodone-acetaminophen (NORCO) 5-325 MG tablet Take 1 tablet by mouth every 6 (six) hours as needed for moderate pain. 12 tablet 0   insulin glargine (LANTUS SOLOSTAR) 100 UNIT/ML Solostar Pen Inject 40 Units into the skin daily. After 1 week, may increase to 50 units daily if blood sugar at home is  above 200. 30 mL 2   insulin lispro (HUMALOG KWIKPEN) 100 UNIT/ML KwikPen Inject 6 Units into the skin 3 (three) times daily with meals. 15 mL 11   Insulin Pen Needle (B-D ULTRAFINE III SHORT PEN) 31G X 8 MM MISC 1 application  by Does not apply route 3 (three) times daily. E11.65 200 each 11   Lancet Devices (MICROLET NEXT LANCING DEVICE) MISC 1 Device by Does not apply route 3 (three) times daily. 1 each 11   levETIRAcetam (KEPPRA XR) 500 MG 24 hr tablet Take 1 tablet (500 mg total) by mouth in the morning and at bedtime. 180 tablet 1   Microlet Lancets MISC Use to check blood sugar three times daily. E11.65 100 each 3   VELPHORO 500 MG chewable tablet Chew 500 mg by mouth 3 (three) times daily with meals.       atorvastatin (LIPITOR) 40 MG tablet Take 1 tablet (40 mg total) by mouth daily. (Patient not taking: Reported on 08/20/2022) 90 tablet 3   carvedilol (COREG) 3.125 MG tablet Take 1 tablet (3.125 mg total) by mouth 2 (two) times daily with a meal. 180 tablet 0             Current Facility-Administered Medications  Medication Dose Route Frequency Provider Last Rate Last Admin   0.9 %  sodium chloride infusion  250 mL Intravenous PRN Cephus Shelling, MD       sodium chloride flush (NS) 0.9 % injection 3 mL  3 mL Intravenous Q12H Cephus Shelling, MD          REVIEW OF SYSTEMS:  [X]  denotes positive finding, [ ]  denotes negative finding Cardiac   Comments:   Chest pain or chest pressure:      Shortness of breath upon exertion:      Short of breath when lying flat:      Irregular heart rhythm:             Vascular      Pain in calf, thigh, or hip brought on by ambulation:      Pain in feet at night that wakes you up from your sleep:       Blood clot in your veins:      Leg swelling:              Pulmonary      Oxygen at home:      Productive cough:       Wheezing:              Neurologic      Sudden weakness in arms or legs:       Sudden numbness in arms or legs:       Sudden onset of difficulty speaking or slurred speech:      Temporary loss of vision in one eye:  Problems with dizziness:              Gastrointestinal      Blood in stool:       Vomited blood:              Genitourinary      Burning when urinating:       Blood in urine:             Psychiatric      Major depression:              Hematologic      Bleeding problems:      Problems with blood clotting too easily:             Skin      Rashes or ulcers:             Constitutional      Fever or chills:          PHYSICAL EXAM:    Vitals:    10/22/22 0919  BP: (!) 150/83  Pulse: 74  Resp: 16  Temp: 98 F (36.7 C)  TempSrc: Temporal  SpO2: 93%  Weight: 297 lb (134.7 kg)  Height: 5\' 8"  (1.727 m)      GENERAL: The patient is a well-nourished male, in no acute distress. The vital signs are documented above. CARDIAC: There is a regular rate and rhythm.  VASCULAR:  Left basilic vein fistula with excellent thrill Left radial pulse palpable No thrill in previously ligated left radiocephalic AVF PULMONARY: No respiratory distress. ABDOMEN: Soft and non-tender. MUSCULOSKELETAL: There are no major deformities or cyanosis. NEUROLOGIC: No focal weakness or paresthesias are detected. PSYCHIATRIC: The patient has a normal affect.   DATA:    DIALYSIS ACCESS  Patient Name:  Zafar Debrosse.  Date of Exam:   10/22/2022 Medical Rec #: [id number]           Accession #:    [id number] Date of Birth: 11/14/1965          Patient Gender: M Patient Age:   68 years Exam Location:  [facility] Procedure:      VAS US DUPLEX DIALYSIS ACCESS (AVF, AVG) Referring Phys: Sherald Hess   --------------------------------------------------------------------------- -----   Reason for Exam: Non-maturation of AVF.  Access Site: Left Upper Extremity.  Access Type: Basilic vein transposition.  History: 08/14/22 Left 1st stage BVT. Slow to mature.  Performing Technologist: Thereasa Parkin RVT    Examination Guidelines: A complete evaluation includes B-mode imaging, spectral Doppler, color Doppler, and power Doppler as needed of all accessible portions of each vessel. Unilateral testing is considered an integral part of a complete examination. Limited examinations for reoccurring indications may be performed as noted.    Findings: +--------------------+----------+-----------------+--------+ AVF                 PSV (cm/s)Flow Vol (mL/min)Comments +--------------------+----------+-----------------+--------+ Native artery inflow   152          1096                +--------------------+----------+-----------------+--------+ AVF Anastomosis        357                              +--------------------+----------+-----------------+--------+    +------------+----------+-------------+----------+--------+ OUTFLOW VEINPSV (cm/s)Diameter (cm)Depth (cm)Describe +------------+----------+-------------+----------+--------+ Prox UA        101        0.78  2.28            +------------+----------+-------------+----------+--------+ Mid UA          93        0.75        1.47            +------------+----------+-------------+----------+--------+ Dist UA        299        0.68        0.86            +------------+----------+-------------+----------+--------+ AC Fossa       711        0.33                         +------------+----------+-------------+----------+--------+     Summary: Patent left BVT. Outflow vein diameter <74mm at the antecubitus with non hemydynamically significant increase in velocity.   *See table(s) above for measurements and observations.   Diagnosing physician: Sherald Hess MD Electronically signed by Sherald Hess MD on 10/22/2022 at 10:25:52 AM.        Assessment/Plan:   57 y.o. male, with end-stage renal disease that presents for evaluation of his left arm AV fistula.  He most recently underwent ligation of his left radiocephalic AV fistula given this was occluded in the upper arm and was non-functional with creation of a left first stage basilic vein fistula on 08/14/2022.    I discussed that his left arm basilic vein fistula duplex looks better today on interval imaging.  The vein appears to measure 6-7 mm.  He has an excellent thrill on exam.  There are great flow volumes.  I have recommended a left second stage basilic vein transposition.  Risks benefits discussed.  Hopefully after a month following surgery we can get his [facility] removed.  Will get schedule next week at his convenience.     Cephus Shelling, MD [facility] Office: [phone]

## 2022-10-30 NOTE — Op Note (Signed)
    OPERATIVE NOTE  DATE: October 30, 2022  PROCEDURE: left second stage basilic vein transposition (brachiobasilic arteriovenous fistula) placement  PRE-OPERATIVE DIAGNOSIS: ESRD  POST-OPERATIVE DIAGNOSIS: same  SURGEON: Cephus Shelling, MD  ASSISTANT(S): Doreatha Massed, PA  ANESTHESIA: general  ESTIMATED BLOOD LOSS: <75 mL  FINDING(S): The left arm basilic vein was fully mobilized through 3 skip incisions.  This did have a sclerotic segment adjacent to the [facility] fossa where I was able to pass a #5 dilator.  Brisk thrill at completion after this was transposed through a new skin tunnel and a new anastomosis was performed.  SPECIMEN(S):  None  INDICATIONS:   Jeremy [name]. is a 57 y.o. male who presents with end stage renal disease.  The patient is scheduled for left second stage basilic vein transposition.  The patient is aware the risks include but are not limited to: bleeding, infection, steal syndrome, nerve damage, ischemic monomelic neuropathy, failure to mature, and need for additional procedures.  The patient is aware of the risks of the procedure and elects to proceed forward.   DESCRIPTION: After full informed written consent was obtained from the patient, the patient was brought back to the operating room and placed supine upon the operating table.  Prior to induction, the patient received IV antibiotics.   After obtaining adequate anesthesia, the patient was then prepped and draped in the standard fashion for a left arm access procedure.  I turned my attention first to identifying the patient's brachiobasilic arteriovenous fistula.  Using SonoSite guidance, the location of this fistula was marked out on the skin.    This was an excellent caliber vein except for a small segment adjacent to the arterial anastomosis.  I made three longitudinal incisions on the medial aspect of the left upper arm.  Through these incisions I dissected out circumferentially the basilic vein,  taking care to protect the nerve.  Once the vein was fully mobilized, all side branches were ligated between silk ties.  The vein was marked for orientation.  I then used a curved tunneler to create a subcutaneous tunnel.  The patient was given 5,000 units of IV heparin.  The vein was then transected near the antecubital crease where there was a smaller segment.  I then passed a #5 dilator here.  It was then brought to the previously created tunnel making sure to maintain proper orientation.  A primary anastomosis was then performed between the two cut ends of the vein with a running 6-0 Prolene after it was spatulated.  Once this was done the clamps were released.  There was excellent flow through the fistula.  Hemostasis was then achieved.  The wound was irrigated.  The incision was closed with a deep layer of 3-0 Vicryl followed by a subcutaneous 4-0 Monocryl and Dermabond.  There were no immediate complications.  COMPLICATIONS: None  CONDITION: Stable  Cephus Shelling, MD [facility] Office: [phone]  Cephus Shelling   10/30/2022, 1:12 PM

## 2022-10-30 NOTE — Transfer of Care (Signed)
Immediate Anesthesia Transfer of Care Note  Patient: Altha Gilleo.  Procedure(s) Performed: LEFT ARM SECOND STAGE BASILIC VEIN TRANSPOSITION (Left)  Patient Location: PACU  Anesthesia Type:General  Level of Consciousness: awake, drowsy, patient cooperative, and responds to stimulation  Airway & Oxygen Therapy: Patient Spontanous Breathing and Patient connected to face mask oxygen  Post-op Assessment: Report given to RN and Post -op Vital signs reviewed and stable  Post vital signs: Reviewed and stable  Last Vitals:  Vitals Value Taken Time  BP    Temp    Pulse 71 10/30/22 1320  Resp 13 10/30/22 1320  SpO2 100 % 10/30/22 1320  Vitals shown include unvalidated device data.  Last Pain:  Vitals:   10/30/22 0825  PainSc: 0-No pain      Patients Stated Pain Goal: 0 (10/30/22 0825)  Complications: No notable events documented.

## 2022-10-30 NOTE — Anesthesia Preprocedure Evaluation (Addendum)
Anesthesia Evaluation  Patient identified by MRN, date of birth, ID band Patient awake    Reviewed: Allergy & Precautions, NPO status , Patient's Chart, lab work & pertinent test results  Airway Mallampati: IV  TM Distance: >3 FB Neck ROM: Full    Dental  (+) Poor Dentition, Missing, Edentulous Upper, Chipped, Loose, Dental Advisory Given,    Pulmonary sleep apnea  Pt never went for his sleep study last year    Pulmonary exam normal breath sounds clear to auscultation       Cardiovascular hypertension (177/67 preop, per pt variable), Pt. on medications + Peripheral Vascular Disease and +CHF (LVEF 2019 45-50%)  Normal cardiovascular exam Rhythm:Regular Rate:Normal  Echo 2019 - Left ventricle: The cavity size was normal. There was mild focal    basal hypertrophy of the septum. Systolic function was mildly    reduced. The estimated ejection fraction was in the range of 45%    to 50%. Diffuse hypokinesis. Indeterminant diastolic function.  - Aortic valve: There was no stenosis.  - Mitral valve: There was no significant regurgitation.  - Right ventricle: The cavity size was normal. Systolic function    was normal.  - Pulmonary arteries: No complete TR doppler jet so unable to    estimate PA systolic pressure.  - Inferior vena cava: The vessel was normal in size. The    respirophasic diameter changes were in the normal range (>= 50%),    consistent with normal central venous pressure.     Neuro/Psych  Headaches, Seizures -, Well Controlled,   negative psych ROS   GI/Hepatic negative GI ROS, Neg liver ROS,,,  Endo/Other  diabetes, Poorly Controlled, Type 2, Insulin Dependent  Morbid obesityA1c 11.9  Renal/GU ESRF and DialysisRenal disease (MWF)K 5.1 this AM, last HD 2d ago- was supposed to go yesterday but wasn't feeling up to it b/c L hand was swollen Per pt they took less off than usual on Monday d/t hypotension   negative  genitourinary   Musculoskeletal negative musculoskeletal ROS (+)    Abdominal  (+) + obese  Peds  Hematology negative hematology ROS (+)   Anesthesia Other Findings   Reproductive/Obstetrics negative OB ROS                             Anesthesia Physical Anesthesia Plan  ASA: 3  Anesthesia Plan: General   Post-op Pain Management: Tylenol PO (pre-op)*   Induction: Intravenous  PONV Risk Score and Plan: 2 and Ondansetron, Dexamethasone, Midazolam and Treatment may vary due to age or medical condition  Airway Management Planned: LMA  Additional Equipment: None  Intra-op Plan:   Post-operative Plan: Extubation in OR  Informed Consent: I have reviewed the patients History and Physical, chart, labs and discussed the procedure including the risks, benefits and alternatives for the proposed anesthesia with the patient or authorized representative who has indicated his/her understanding and acceptance.     Dental advisory given  Plan Discussed with: CRNA  Anesthesia Plan Comments: (LMA 5 for last fistula procedure)       Anesthesia Quick Evaluation

## 2022-10-30 NOTE — Anesthesia Postprocedure Evaluation (Signed)
Anesthesia Post Note  Patient: Kaedan Diggles.  Procedure(s) Performed: LEFT ARM SECOND STAGE BASILIC VEIN TRANSPOSITION (Left)     Patient location during evaluation: PACU Anesthesia Type: General Level of consciousness: awake and alert, oriented and patient cooperative Pain management: pain level controlled Vital Signs Assessment: post-procedure vital signs reviewed and stable Respiratory status: spontaneous breathing, nonlabored ventilation and respiratory function stable Cardiovascular status: blood pressure returned to baseline and stable Postop Assessment: no apparent nausea or vomiting Anesthetic complications: no   No notable events documented.  Last Vitals:  Vitals:   10/30/22 1400 10/30/22 1415  BP: (!) 82/58 (!) 96/48  Pulse: 67 67  Resp: 16 16  Temp:  36.6 C  SpO2: 95% 100%    Last Pain:  Vitals:   10/30/22 1415  PainSc: 5                  Lannie Fields

## 2022-10-31 DIAGNOSIS — D688 Other specified coagulation defects: Secondary | ICD-10-CM | POA: Diagnosis not present

## 2022-10-31 DIAGNOSIS — N2581 Secondary hyperparathyroidism of renal origin: Secondary | ICD-10-CM | POA: Diagnosis not present

## 2022-10-31 DIAGNOSIS — Z992 Dependence on renal dialysis: Secondary | ICD-10-CM | POA: Diagnosis not present

## 2022-10-31 DIAGNOSIS — D631 Anemia in chronic kidney disease: Secondary | ICD-10-CM | POA: Diagnosis not present

## 2022-10-31 DIAGNOSIS — E1129 Type 2 diabetes mellitus with other diabetic kidney complication: Secondary | ICD-10-CM | POA: Diagnosis not present

## 2022-10-31 DIAGNOSIS — D509 Iron deficiency anemia, unspecified: Secondary | ICD-10-CM | POA: Diagnosis not present

## 2022-10-31 DIAGNOSIS — N186 End stage renal disease: Secondary | ICD-10-CM | POA: Diagnosis not present

## 2022-11-01 DIAGNOSIS — D688 Other specified coagulation defects: Secondary | ICD-10-CM | POA: Diagnosis not present

## 2022-11-01 DIAGNOSIS — D509 Iron deficiency anemia, unspecified: Secondary | ICD-10-CM | POA: Diagnosis not present

## 2022-11-01 DIAGNOSIS — N2581 Secondary hyperparathyroidism of renal origin: Secondary | ICD-10-CM | POA: Diagnosis not present

## 2022-11-01 DIAGNOSIS — Z992 Dependence on renal dialysis: Secondary | ICD-10-CM | POA: Diagnosis not present

## 2022-11-01 DIAGNOSIS — E1129 Type 2 diabetes mellitus with other diabetic kidney complication: Secondary | ICD-10-CM | POA: Diagnosis not present

## 2022-11-01 DIAGNOSIS — N186 End stage renal disease: Secondary | ICD-10-CM | POA: Diagnosis not present

## 2022-11-01 DIAGNOSIS — D631 Anemia in chronic kidney disease: Secondary | ICD-10-CM | POA: Diagnosis not present

## 2022-11-04 DIAGNOSIS — D631 Anemia in chronic kidney disease: Secondary | ICD-10-CM | POA: Diagnosis not present

## 2022-11-04 DIAGNOSIS — E1129 Type 2 diabetes mellitus with other diabetic kidney complication: Secondary | ICD-10-CM | POA: Diagnosis not present

## 2022-11-04 DIAGNOSIS — D509 Iron deficiency anemia, unspecified: Secondary | ICD-10-CM | POA: Diagnosis not present

## 2022-11-04 DIAGNOSIS — N2581 Secondary hyperparathyroidism of renal origin: Secondary | ICD-10-CM | POA: Diagnosis not present

## 2022-11-04 DIAGNOSIS — Z992 Dependence on renal dialysis: Secondary | ICD-10-CM | POA: Diagnosis not present

## 2022-11-04 DIAGNOSIS — D688 Other specified coagulation defects: Secondary | ICD-10-CM | POA: Diagnosis not present

## 2022-11-04 DIAGNOSIS — N186 End stage renal disease: Secondary | ICD-10-CM | POA: Diagnosis not present

## 2022-11-05 ENCOUNTER — Other Ambulatory Visit: Payer: Self-pay

## 2022-11-06 DIAGNOSIS — N2581 Secondary hyperparathyroidism of renal origin: Secondary | ICD-10-CM | POA: Diagnosis not present

## 2022-11-06 DIAGNOSIS — N186 End stage renal disease: Secondary | ICD-10-CM | POA: Diagnosis not present

## 2022-11-06 DIAGNOSIS — E1129 Type 2 diabetes mellitus with other diabetic kidney complication: Secondary | ICD-10-CM | POA: Diagnosis not present

## 2022-11-06 DIAGNOSIS — D688 Other specified coagulation defects: Secondary | ICD-10-CM | POA: Diagnosis not present

## 2022-11-06 DIAGNOSIS — Z992 Dependence on renal dialysis: Secondary | ICD-10-CM | POA: Diagnosis not present

## 2022-11-06 DIAGNOSIS — D509 Iron deficiency anemia, unspecified: Secondary | ICD-10-CM | POA: Diagnosis not present

## 2022-11-06 DIAGNOSIS — D631 Anemia in chronic kidney disease: Secondary | ICD-10-CM | POA: Diagnosis not present

## 2022-11-08 DIAGNOSIS — D631 Anemia in chronic kidney disease: Secondary | ICD-10-CM | POA: Diagnosis not present

## 2022-11-08 DIAGNOSIS — N2581 Secondary hyperparathyroidism of renal origin: Secondary | ICD-10-CM | POA: Diagnosis not present

## 2022-11-08 DIAGNOSIS — E1129 Type 2 diabetes mellitus with other diabetic kidney complication: Secondary | ICD-10-CM | POA: Diagnosis not present

## 2022-11-08 DIAGNOSIS — D688 Other specified coagulation defects: Secondary | ICD-10-CM | POA: Diagnosis not present

## 2022-11-08 DIAGNOSIS — D509 Iron deficiency anemia, unspecified: Secondary | ICD-10-CM | POA: Diagnosis not present

## 2022-11-08 DIAGNOSIS — N186 End stage renal disease: Secondary | ICD-10-CM | POA: Diagnosis not present

## 2022-11-08 DIAGNOSIS — Z992 Dependence on renal dialysis: Secondary | ICD-10-CM | POA: Diagnosis not present

## 2022-11-11 DIAGNOSIS — N186 End stage renal disease: Secondary | ICD-10-CM | POA: Diagnosis not present

## 2022-11-11 DIAGNOSIS — N2581 Secondary hyperparathyroidism of renal origin: Secondary | ICD-10-CM | POA: Diagnosis not present

## 2022-11-11 DIAGNOSIS — D688 Other specified coagulation defects: Secondary | ICD-10-CM | POA: Diagnosis not present

## 2022-11-11 DIAGNOSIS — Z992 Dependence on renal dialysis: Secondary | ICD-10-CM | POA: Diagnosis not present

## 2022-11-11 DIAGNOSIS — D509 Iron deficiency anemia, unspecified: Secondary | ICD-10-CM | POA: Diagnosis not present

## 2022-11-11 DIAGNOSIS — D631 Anemia in chronic kidney disease: Secondary | ICD-10-CM | POA: Diagnosis not present

## 2022-11-11 DIAGNOSIS — E1129 Type 2 diabetes mellitus with other diabetic kidney complication: Secondary | ICD-10-CM | POA: Diagnosis not present

## 2022-11-11 NOTE — Progress Notes (Unsigned)
POST OPERATIVE OFFICE NOTE    CC:  F/u for surgery  HPI:  This is a 57 y.o. male who is s/p left 2nd stage BVT on 10/30/2022 by Dr. Chestine Spore.   Dialysis access history: -left [facility] AVF 11/11/2019 Dr. Chestine Spore -left [facility] AVF revision with side branch ligation x 5 & superficialization 03/23/2020 Dr. Chestine Spore -fistulogram/central venogram 07/25/2022 Dr. Chestine Spore -[facility] placement 07/25/2022 Dr. Chestine Spore -left 1st stage BVT 08/14/2022 Dr. Chestine Spore -left 2nd stage BVT 10/30/2022  Pt states ***he does *** have pain/numbness in the *** hand.    The pt *** on dialysis *** at *** location.   Allergies  Allergen Reactions   Amlodipine Other (See Comments)    Dizziness    Current Outpatient Medications  Medication Sig Dispense Refill   aspirin EC (ASPIRIN LOW DOSE) 81 MG tablet Take 1 tablet (81 mg total) by mouth daily. 90 tablet 0   atorvastatin (LIPITOR) 40 MG tablet Take 1 tablet (40 mg total) by mouth daily. 90 tablet 3   Blood Glucose Monitoring Suppl (CONTOUR NEXT EZ) w/Device KIT Use to check blood sugar three times daily. 1 kit 0   Continuous Blood Gluc Receiver (FREESTYLE LIBRE 2 READER) DEVI Use to check blood sugar three times daily. 1 each 0   Continuous Blood Gluc Sensor (FREESTYLE LIBRE 2 SENSOR) MISC Use to check blood sugar three times daily. 2 each 3   diclofenac Sodium (VOLTAREN) 1 % GEL Apply 4 g topically 4 (four) times daily. 200 g 1   Doxercalciferol (HECTOROL IV) Doxercalciferol (Hectorol)     enalapril (VASOTEC) 5 MG tablet Take 1 tablet (5 mg total) by mouth daily. 90 tablet 1   glucose blood (CONTOUR NEXT TEST) test strip Use to check blood sugar three times daily. 100 each 12   HYDROcodone-acetaminophen (NORCO) 5-325 MG tablet Take 1 tablet by mouth every 6 (six) hours as needed for moderate pain. 20 tablet 0   insulin glargine (LANTUS SOLOSTAR) 100 UNIT/ML Solostar Pen Inject 50 Units into the skin daily. 45 mL 1   insulin lispro (HUMALOG KWIKPEN) 100 UNIT/ML KwikPen Inject 6 Units into the  skin 3 (three) times daily with meals. 15 mL 11   Insulin Pen Needle (B-D ULTRAFINE III SHORT PEN) 31G X 8 MM MISC 1 application  by Does not apply route 3 (three) times daily. E11.65 200 each 11   Lancet Devices (MICROLET NEXT LANCING DEVICE) MISC 1 Device by Does not apply route 3 (three) times daily. 1 each 11   levETIRAcetam (KEPPRA XR) 500 MG 24 hr tablet Take 1 tablet (500 mg total) by mouth in the morning and at bedtime. 180 tablet 1   Microlet Lancets MISC Use to check blood sugar three times daily. E11.65 100 each 3   VELPHORO 500 MG chewable tablet Chew 500 mg by mouth 3 (three) times daily with meals.     Current Facility-Administered Medications  Medication Dose Route Frequency Provider Last Rate Last Admin   0.9 %  sodium chloride infusion  250 mL Intravenous PRN Cephus Shelling, MD       sodium chloride flush (NS) 0.9 % injection 3 mL  3 mL Intravenous Q12H Cephus Shelling, MD         ROS:  See HPI  Physical Exam:  ***  Incision:  *** Extremities:   There *** a palpable *** pulse.   Motor and sensory *** in tact.   There *** a thrill/bruit present.  Access is *** easily palpable  Assessment/Plan:  This is a 57 y.o. male who is s/p:  left 2nd stage BVT on 10/30/2022 by Dr. Chestine Spore.  -the pt does *** have evidence of steal. -pt's access can be used ***. -if pt has tunneled catheter, this can be removed at the discretion of the dialysis center once the pt's access has been successfully cannulated to their satisfaction.  -discussed with pt that access does not last forever and will need intervention or even new access at some point.  -the pt will follow up ***   Doreatha Massed, [facility] [phone]  Clinic MD:  Chestine Spore

## 2022-11-12 ENCOUNTER — Ambulatory Visit (INDEPENDENT_AMBULATORY_CARE_PROVIDER_SITE_OTHER): Payer: Medicare Other | Admitting: Physician Assistant

## 2022-11-12 VITALS — BP 152/81 | HR 75 | Temp 97.8°F | Resp 18 | Ht 68.0 in | Wt 298.5 lb

## 2022-11-12 DIAGNOSIS — Z992 Dependence on renal dialysis: Secondary | ICD-10-CM

## 2022-11-12 DIAGNOSIS — N186 End stage renal disease: Secondary | ICD-10-CM

## 2022-11-15 DIAGNOSIS — N186 End stage renal disease: Secondary | ICD-10-CM | POA: Diagnosis not present

## 2022-11-15 DIAGNOSIS — E1129 Type 2 diabetes mellitus with other diabetic kidney complication: Secondary | ICD-10-CM | POA: Diagnosis not present

## 2022-11-15 DIAGNOSIS — N2581 Secondary hyperparathyroidism of renal origin: Secondary | ICD-10-CM | POA: Diagnosis not present

## 2022-11-15 DIAGNOSIS — D631 Anemia in chronic kidney disease: Secondary | ICD-10-CM | POA: Diagnosis not present

## 2022-11-15 DIAGNOSIS — D688 Other specified coagulation defects: Secondary | ICD-10-CM | POA: Diagnosis not present

## 2022-11-15 DIAGNOSIS — D509 Iron deficiency anemia, unspecified: Secondary | ICD-10-CM | POA: Diagnosis not present

## 2022-11-15 DIAGNOSIS — Z992 Dependence on renal dialysis: Secondary | ICD-10-CM | POA: Diagnosis not present

## 2022-11-18 DIAGNOSIS — N2581 Secondary hyperparathyroidism of renal origin: Secondary | ICD-10-CM | POA: Diagnosis not present

## 2022-11-18 DIAGNOSIS — D509 Iron deficiency anemia, unspecified: Secondary | ICD-10-CM | POA: Diagnosis not present

## 2022-11-18 DIAGNOSIS — Z992 Dependence on renal dialysis: Secondary | ICD-10-CM | POA: Diagnosis not present

## 2022-11-18 DIAGNOSIS — D631 Anemia in chronic kidney disease: Secondary | ICD-10-CM | POA: Diagnosis not present

## 2022-11-18 DIAGNOSIS — E1129 Type 2 diabetes mellitus with other diabetic kidney complication: Secondary | ICD-10-CM | POA: Diagnosis not present

## 2022-11-18 DIAGNOSIS — D688 Other specified coagulation defects: Secondary | ICD-10-CM | POA: Diagnosis not present

## 2022-11-18 DIAGNOSIS — N186 End stage renal disease: Secondary | ICD-10-CM | POA: Diagnosis not present

## 2022-11-19 DIAGNOSIS — I129 Hypertensive chronic kidney disease with stage 1 through stage 4 chronic kidney disease, or unspecified chronic kidney disease: Secondary | ICD-10-CM | POA: Diagnosis not present

## 2022-11-19 DIAGNOSIS — Z992 Dependence on renal dialysis: Secondary | ICD-10-CM | POA: Diagnosis not present

## 2022-11-19 DIAGNOSIS — N186 End stage renal disease: Secondary | ICD-10-CM | POA: Diagnosis not present

## 2022-11-20 DIAGNOSIS — Z992 Dependence on renal dialysis: Secondary | ICD-10-CM | POA: Diagnosis not present

## 2022-11-20 DIAGNOSIS — D688 Other specified coagulation defects: Secondary | ICD-10-CM | POA: Diagnosis not present

## 2022-11-20 DIAGNOSIS — D509 Iron deficiency anemia, unspecified: Secondary | ICD-10-CM | POA: Diagnosis not present

## 2022-11-20 DIAGNOSIS — D631 Anemia in chronic kidney disease: Secondary | ICD-10-CM | POA: Diagnosis not present

## 2022-11-20 DIAGNOSIS — N2581 Secondary hyperparathyroidism of renal origin: Secondary | ICD-10-CM | POA: Diagnosis not present

## 2022-11-20 DIAGNOSIS — N186 End stage renal disease: Secondary | ICD-10-CM | POA: Diagnosis not present

## 2022-11-20 DIAGNOSIS — E1129 Type 2 diabetes mellitus with other diabetic kidney complication: Secondary | ICD-10-CM | POA: Diagnosis not present

## 2022-11-22 DIAGNOSIS — D509 Iron deficiency anemia, unspecified: Secondary | ICD-10-CM | POA: Diagnosis not present

## 2022-11-22 DIAGNOSIS — N2581 Secondary hyperparathyroidism of renal origin: Secondary | ICD-10-CM | POA: Diagnosis not present

## 2022-11-22 DIAGNOSIS — D688 Other specified coagulation defects: Secondary | ICD-10-CM | POA: Diagnosis not present

## 2022-11-22 DIAGNOSIS — E1129 Type 2 diabetes mellitus with other diabetic kidney complication: Secondary | ICD-10-CM | POA: Diagnosis not present

## 2022-11-22 DIAGNOSIS — D631 Anemia in chronic kidney disease: Secondary | ICD-10-CM | POA: Diagnosis not present

## 2022-11-22 DIAGNOSIS — N186 End stage renal disease: Secondary | ICD-10-CM | POA: Diagnosis not present

## 2022-11-22 DIAGNOSIS — Z992 Dependence on renal dialysis: Secondary | ICD-10-CM | POA: Diagnosis not present

## 2022-11-25 DIAGNOSIS — D688 Other specified coagulation defects: Secondary | ICD-10-CM | POA: Diagnosis not present

## 2022-11-25 DIAGNOSIS — E1129 Type 2 diabetes mellitus with other diabetic kidney complication: Secondary | ICD-10-CM | POA: Diagnosis not present

## 2022-11-25 DIAGNOSIS — D631 Anemia in chronic kidney disease: Secondary | ICD-10-CM | POA: Diagnosis not present

## 2022-11-25 DIAGNOSIS — Z992 Dependence on renal dialysis: Secondary | ICD-10-CM | POA: Diagnosis not present

## 2022-11-25 DIAGNOSIS — D509 Iron deficiency anemia, unspecified: Secondary | ICD-10-CM | POA: Diagnosis not present

## 2022-11-25 DIAGNOSIS — N186 End stage renal disease: Secondary | ICD-10-CM | POA: Diagnosis not present

## 2022-11-25 DIAGNOSIS — N2581 Secondary hyperparathyroidism of renal origin: Secondary | ICD-10-CM | POA: Diagnosis not present

## 2022-11-26 ENCOUNTER — Ambulatory Visit: Payer: Medicare Other | Admitting: Nurse Practitioner

## 2022-11-29 DIAGNOSIS — Z992 Dependence on renal dialysis: Secondary | ICD-10-CM | POA: Diagnosis not present

## 2022-11-29 DIAGNOSIS — D631 Anemia in chronic kidney disease: Secondary | ICD-10-CM | POA: Diagnosis not present

## 2022-11-29 DIAGNOSIS — D688 Other specified coagulation defects: Secondary | ICD-10-CM | POA: Diagnosis not present

## 2022-11-29 DIAGNOSIS — E1129 Type 2 diabetes mellitus with other diabetic kidney complication: Secondary | ICD-10-CM | POA: Diagnosis not present

## 2022-11-29 DIAGNOSIS — D509 Iron deficiency anemia, unspecified: Secondary | ICD-10-CM | POA: Diagnosis not present

## 2022-11-29 DIAGNOSIS — N186 End stage renal disease: Secondary | ICD-10-CM | POA: Diagnosis not present

## 2022-11-29 DIAGNOSIS — N2581 Secondary hyperparathyroidism of renal origin: Secondary | ICD-10-CM | POA: Diagnosis not present

## 2022-12-02 DIAGNOSIS — N186 End stage renal disease: Secondary | ICD-10-CM | POA: Diagnosis not present

## 2022-12-02 DIAGNOSIS — N2581 Secondary hyperparathyroidism of renal origin: Secondary | ICD-10-CM | POA: Diagnosis not present

## 2022-12-02 DIAGNOSIS — D509 Iron deficiency anemia, unspecified: Secondary | ICD-10-CM | POA: Diagnosis not present

## 2022-12-02 DIAGNOSIS — D631 Anemia in chronic kidney disease: Secondary | ICD-10-CM | POA: Diagnosis not present

## 2022-12-02 DIAGNOSIS — Z992 Dependence on renal dialysis: Secondary | ICD-10-CM | POA: Diagnosis not present

## 2022-12-02 DIAGNOSIS — E1129 Type 2 diabetes mellitus with other diabetic kidney complication: Secondary | ICD-10-CM | POA: Diagnosis not present

## 2022-12-02 DIAGNOSIS — D688 Other specified coagulation defects: Secondary | ICD-10-CM | POA: Diagnosis not present

## 2022-12-04 DIAGNOSIS — D509 Iron deficiency anemia, unspecified: Secondary | ICD-10-CM | POA: Diagnosis not present

## 2022-12-04 DIAGNOSIS — N186 End stage renal disease: Secondary | ICD-10-CM | POA: Diagnosis not present

## 2022-12-04 DIAGNOSIS — D688 Other specified coagulation defects: Secondary | ICD-10-CM | POA: Diagnosis not present

## 2022-12-04 DIAGNOSIS — N2581 Secondary hyperparathyroidism of renal origin: Secondary | ICD-10-CM | POA: Diagnosis not present

## 2022-12-04 DIAGNOSIS — D631 Anemia in chronic kidney disease: Secondary | ICD-10-CM | POA: Diagnosis not present

## 2022-12-04 DIAGNOSIS — E1129 Type 2 diabetes mellitus with other diabetic kidney complication: Secondary | ICD-10-CM | POA: Diagnosis not present

## 2022-12-04 DIAGNOSIS — Z992 Dependence on renal dialysis: Secondary | ICD-10-CM | POA: Diagnosis not present

## 2022-12-05 ENCOUNTER — Ambulatory Visit: Payer: Medicare Other | Attending: Nurse Practitioner | Admitting: Pharmacist

## 2022-12-05 VITALS — BP 155/77 | HR 76

## 2022-12-05 DIAGNOSIS — Z794 Long term (current) use of insulin: Secondary | ICD-10-CM | POA: Diagnosis not present

## 2022-12-05 DIAGNOSIS — E1151 Type 2 diabetes mellitus with diabetic peripheral angiopathy without gangrene: Secondary | ICD-10-CM | POA: Diagnosis not present

## 2022-12-05 LAB — POCT GLYCOSYLATED HEMOGLOBIN (HGB A1C): HbA1c, POC (controlled diabetic range): 7.4 % — AB (ref 0.0–7.0)

## 2022-12-05 NOTE — Progress Notes (Signed)
S:     No chief complaint on file.  57 y.o. male who presents for diabetes evaluation, education, and management.  PMH is significant for T2DM w/ ESRD (MWF HD), anemia of chronic disease, resistant HTN, OSA, HLD, obesity, epilepsy. Patient was referred by Zelda on 08/20/2022. I've been seeing him consistently since 06/2022 and we last saw him in 10/24/2022. Had him restart his antihypertensive at that visit. Additionally, we encouraged him to place his [place] device. We were unable to make adjustments to his insulin regimen without home data.   Today, patient arrives in good spirits and presents without any assistance. He has not placed his Libre device but endorses compliance with his insulin regimen. He has no complaints today regarding DM. Of note, he still would like to use the [place] [facility] system but just has not placed his sensor yet. He tells me he does have the supplies at home.  For his BP, Mr. Prew tells me today that his blood pressure has been dropping at dialysis and he has stopped taking all of his blood pressure medications consistently. On non-dialysis days, he does not check his blood pressure at home but will sometimes take the enalapril.   Family/Social History:  -Fhx: DM, heart disease, seizure disorder, stroke -Tobacco: never smoker  -Alcohol: none reported   Current diabetes medications include: Lantus 50 units daily (sometimes will adjust if hypo to 45u), Humalog 5 or 6 units TID Current hypertension medications include: enalapril 5 mg (only taking on non-dialysis days) - this is limited by dizziness and lightheadedness.   Insurance coverage: BCBS  Reported home fasting blood sugars: not checking consistently. Gives range in the 150s-200s. Still not using his CGM. Typically only checks BG in AM if he does check it.  Patient denies nocturia (nighttime urination).  Patient reports neuropathy (nerve pain). Patient denies visual changes. Patient reports self foot exams.    Patient reported no changes from previous dietary habits:  -Non-adherent to sodium restriction. "Still in that salt game" -Drinks regular Pepsi and Mt. Dew daily.  -Does not limit carbohydrates but tries to avoid sweets. -Meals: does not eat at regular intervals.   Patient-reported exercise habits: walking and yard work  O:   7 day average blood glucose: no meter with him. No CGM in place.  Lab Results  Component Value Date   HGBA1C 7.4 (A) 12/05/2022   Vitals:   12/05/22 0857  BP: (!) 155/77  Pulse: 76   Lipid Panel     Component Value Date/Time   CHOL 261 (H) 02/04/2022 1129   TRIG 222 (H) 02/04/2022 1129   HDL 54 02/04/2022 1129   CHOLHDL 4.8 02/04/2022 1129   CHOLHDL 3.1 02/09/2016 1045   VLDL 11 02/09/2016 1045   LDLCALC 166 (H) 02/04/2022 1129   Clinical Atherosclerotic Cardiovascular Disease (ASCVD): No  The 10-year ASCVD risk score (Arnett DK, et al., 2019) is: 29.8%   Values used to calculate the score:     Age: 89 years     Sex: Male     Is Non-Hispanic African American: Yes     Diabetic: Yes     Tobacco smoker: No     Systolic Blood Pressure: 155 mmHg     Is BP treated: Yes     HDL Cholesterol: 54 mg/dL     Total Cholesterol: 261 mg/dL   A/P: Diabetes longstanding currently close to goal. His A1c today is closest it's been to goal of <7 in quite some time. Of  note, he does have a hx of ESRD and his A1c may not be accurate. His H/H levels were normal on last check in March even though he has a hx of anemia. Patient is able to verbalize appropriate hypoglycemia management plan. Medication adherence appears suboptimal. CGM is going to be crucial for him moving forward so we can better assess his glycemic control and GMI (glucose management index). His main problem historically has been medication cost and adherence in general. However, his adherence has been improving and we are now closer to goal because of this.  -Continue Lantus 50 units daily.   -Continue Humalog 6u TID before meals.  -Patient has [place] supplies. Encouraged to visit pharmacy to assist with set-up.  -Patient educated on purpose, proper use, and potential adverse effects of Lantus.  -Extensively discussed pathophysiology of diabetes, recommended lifestyle interventions, dietary effects on blood sugar control.  -Counseled on s/sx of and management of hypoglycemia.  -Next A1c anticipated 02/2023. Consider evaluating fructosamine at next visit for better reflection of DM control.   Hypertension longstanding currently above goal. Blood pressure goal of <130/80 mmHg. Patient not taking any blood pressure medications consistently d/t hypotension on dialysis days. Will occasionally take his enalapril on non-dialysis days. Will hold off on additional changes at this time.   Written patient instructions provided. Patient verbalized understanding of treatment plan.  Total time in face to face counseling 45 minutes.    Follow-up:  Pharmacist in 2 months. PCP clinic visit next week.  Butch Penny, PharmD, [name], CPP Clinical Pharmacist [facility] & [facility] [phone]

## 2022-12-06 DIAGNOSIS — Z992 Dependence on renal dialysis: Secondary | ICD-10-CM | POA: Diagnosis not present

## 2022-12-06 DIAGNOSIS — D688 Other specified coagulation defects: Secondary | ICD-10-CM | POA: Diagnosis not present

## 2022-12-06 DIAGNOSIS — D631 Anemia in chronic kidney disease: Secondary | ICD-10-CM | POA: Diagnosis not present

## 2022-12-06 DIAGNOSIS — N186 End stage renal disease: Secondary | ICD-10-CM | POA: Diagnosis not present

## 2022-12-06 DIAGNOSIS — D509 Iron deficiency anemia, unspecified: Secondary | ICD-10-CM | POA: Diagnosis not present

## 2022-12-06 DIAGNOSIS — E1129 Type 2 diabetes mellitus with other diabetic kidney complication: Secondary | ICD-10-CM | POA: Diagnosis not present

## 2022-12-06 DIAGNOSIS — N2581 Secondary hyperparathyroidism of renal origin: Secondary | ICD-10-CM | POA: Diagnosis not present

## 2022-12-10 ENCOUNTER — Ambulatory Visit (INDEPENDENT_AMBULATORY_CARE_PROVIDER_SITE_OTHER): Payer: Medicare Other | Admitting: Podiatry

## 2022-12-10 ENCOUNTER — Ambulatory Visit: Payer: Medicare Other | Admitting: Podiatry

## 2022-12-10 ENCOUNTER — Encounter: Payer: Self-pay | Admitting: Podiatry

## 2022-12-10 DIAGNOSIS — B351 Tinea unguium: Secondary | ICD-10-CM | POA: Diagnosis not present

## 2022-12-10 DIAGNOSIS — M79674 Pain in right toe(s): Secondary | ICD-10-CM

## 2022-12-10 DIAGNOSIS — M79675 Pain in left toe(s): Secondary | ICD-10-CM

## 2022-12-10 DIAGNOSIS — Z794 Long term (current) use of insulin: Secondary | ICD-10-CM

## 2022-12-10 DIAGNOSIS — Z992 Dependence on renal dialysis: Secondary | ICD-10-CM

## 2022-12-10 DIAGNOSIS — D631 Anemia in chronic kidney disease: Secondary | ICD-10-CM | POA: Diagnosis not present

## 2022-12-10 DIAGNOSIS — E0822 Diabetes mellitus due to underlying condition with diabetic chronic kidney disease: Secondary | ICD-10-CM

## 2022-12-10 DIAGNOSIS — D509 Iron deficiency anemia, unspecified: Secondary | ICD-10-CM | POA: Diagnosis not present

## 2022-12-10 DIAGNOSIS — E1129 Type 2 diabetes mellitus with other diabetic kidney complication: Secondary | ICD-10-CM | POA: Diagnosis not present

## 2022-12-10 DIAGNOSIS — N186 End stage renal disease: Secondary | ICD-10-CM | POA: Diagnosis not present

## 2022-12-10 DIAGNOSIS — D688 Other specified coagulation defects: Secondary | ICD-10-CM | POA: Diagnosis not present

## 2022-12-10 DIAGNOSIS — N2581 Secondary hyperparathyroidism of renal origin: Secondary | ICD-10-CM | POA: Diagnosis not present

## 2022-12-11 ENCOUNTER — Ambulatory Visit: Payer: Medicare Other | Attending: Nurse Practitioner | Admitting: Physician Assistant

## 2022-12-11 ENCOUNTER — Encounter: Payer: Self-pay | Admitting: Physician Assistant

## 2022-12-11 VITALS — BP 118/78 | HR 86 | Wt 294.2 lb

## 2022-12-11 DIAGNOSIS — Z794 Long term (current) use of insulin: Secondary | ICD-10-CM

## 2022-12-11 DIAGNOSIS — I10 Essential (primary) hypertension: Secondary | ICD-10-CM

## 2022-12-11 DIAGNOSIS — N186 End stage renal disease: Secondary | ICD-10-CM | POA: Diagnosis not present

## 2022-12-11 DIAGNOSIS — E1129 Type 2 diabetes mellitus with other diabetic kidney complication: Secondary | ICD-10-CM | POA: Diagnosis not present

## 2022-12-11 DIAGNOSIS — E1151 Type 2 diabetes mellitus with diabetic peripheral angiopathy without gangrene: Secondary | ICD-10-CM

## 2022-12-11 DIAGNOSIS — Z992 Dependence on renal dialysis: Secondary | ICD-10-CM | POA: Diagnosis not present

## 2022-12-11 DIAGNOSIS — N2581 Secondary hyperparathyroidism of renal origin: Secondary | ICD-10-CM | POA: Diagnosis not present

## 2022-12-11 DIAGNOSIS — D509 Iron deficiency anemia, unspecified: Secondary | ICD-10-CM | POA: Diagnosis not present

## 2022-12-11 DIAGNOSIS — D688 Other specified coagulation defects: Secondary | ICD-10-CM | POA: Diagnosis not present

## 2022-12-11 DIAGNOSIS — D631 Anemia in chronic kidney disease: Secondary | ICD-10-CM | POA: Diagnosis not present

## 2022-12-11 LAB — GLUCOSE, POCT (MANUAL RESULT ENTRY): POC Glucose: 231 mg/dl — AB (ref 70–99)

## 2022-12-11 NOTE — Progress Notes (Signed)
Patient ID: Jeremy Jarvis., male   DOB: 1965-10-11, 57 y.o.   MRN: [id number]   Jeremy [name], is a 57 y.o. male  [id number]  [id number]  DOB - [birth year]  Chief Complaint  Patient presents with   Diabetes   Cerumen Impaction       Subjective:   Jeremy [name] is a 57 y.o. male here today after being seen by Franky Macho about 1 week ago.  I am actually not sure why I am seeing the patient today and he is unsure too.  He has still not started wearing his libre.  A1C=7.4 last week.  He does not need RF.  No problems updated.  ALLERGIES: Allergies  Allergen Reactions   Amlodipine Other (See Comments)    Dizziness    PAST MEDICAL HISTORY: Past Medical History:  Diagnosis Date   Abscess    AKI (acute kidney injury) (HCC) 08/05/2017   Bell's palsy    right eye abn since dx bell's palsy   CKD (chronic kidney disease)    Dr. McDiarmind    COVID 2020   mild   Dyspnea    occasional with exertion   ESRD (end stage renal disease) (HCC)    MWF -[facility]   Headache    migraines   High cholesterol    Hypertension    Left shoulder pain 10/26/2013   S/p injection on 10/26/13    Renal insufficiency 02/14/2014   Seizures (HCC) 08/04/2017   "related to high blood sugars" (08/05/2017)   Type II diabetes mellitus (HCC)     MEDICATIONS AT HOME: Prior to Admission medications   Medication Sig Start Date End Date Taking? Authorizing Provider  aspirin EC (ASPIRIN LOW DOSE) 81 MG tablet Take 1 tablet (81 mg total) by mouth daily. 05/16/22  Yes Georgian Co M, PA-C  atorvastatin (LIPITOR) 40 MG tablet Take 1 tablet (40 mg total) by mouth daily. 10/24/22  Yes Hoy Register, MD  Blood Glucose Monitoring Suppl (CONTOUR NEXT EZ) w/Device KIT Use to check blood sugar three times daily. 08/06/22  Yes Hoy Register, MD  Continuous Blood Gluc Receiver (FREESTYLE LIBRE 2 READER) DEVI Use to check blood sugar three times daily. 08/06/22  Yes Hoy Register, MD  Continuous Blood Gluc  Sensor (FREESTYLE LIBRE 2 SENSOR) MISC Use to check blood sugar three times daily. 08/06/22  Yes Hoy Register, MD  diclofenac Sodium (VOLTAREN) 1 % GEL Apply 4 g topically 4 (four) times daily. 08/20/22  Yes Claiborne Rigg, NP  Doxercalciferol (HECTOROL IV) Doxercalciferol (Hectorol) 09/02/22 09/01/23 Yes [provider]  enalapril (VASOTEC) 5 MG tablet Take 1 tablet (5 mg total) by mouth daily. 10/24/22  Yes Newlin, Enobong, MD  glucose blood (CONTOUR NEXT TEST) test strip Use to check blood sugar three times daily. 08/06/22  Yes Hoy Register, MD  HYDROcodone-acetaminophen (NORCO) 5-325 MG tablet Take 1 tablet by mouth every 6 (six) hours as needed for moderate pain. 10/30/22  Yes Rhyne, Samantha J, PA-C  insulin glargine (LANTUS SOLOSTAR) 100 UNIT/ML Solostar Pen Inject 50 Units into the skin daily. 10/24/22  Yes Newlin, Odette Horns, MD  insulin lispro (HUMALOG KWIKPEN) 100 UNIT/ML KwikPen Inject 6 Units into the skin 3 (three) times daily with meals. 08/20/22  Yes Claiborne Rigg, NP  Insulin Pen Needle (B-D ULTRAFINE III SHORT PEN) 31G X 8 MM MISC 1 application  by Does not apply route 3 (three) times daily. E11.65 05/16/22  Yes Anders Simmonds, PA-C  Lancet Devices (MICROLET NEXT LANCING DEVICE) MISC  1 Device by Does not apply route 3 (three) times daily. 11/30/19  Yes Claiborne Rigg, NP  levETIRAcetam (KEPPRA XR) 500 MG 24 hr tablet Take 1 tablet (500 mg total) by mouth in the morning and at bedtime. 08/20/22  Yes Claiborne Rigg, NP  Microlet Lancets MISC Use to check blood sugar three times daily. E11.65 08/06/22  Yes Hoy Register, MD  VELPHORO 500 MG chewable tablet Chew 500 mg by mouth 3 (three) times daily with meals. 02/07/20  Yes [provider]    ROS: Neg HEENT Neg resp Neg cardiac Neg GI Neg GU Neg MS Neg psych Neg neuro  Objective:   Vitals:   12/11/22 1532 12/11/22 1623  BP: (!) 150/82 118/78  Pulse: 86   SpO2: 97%   Weight: 294 lb 3.2 oz (133.4 kg)     Exam General appearance : Awake, alert, not in any distress. Speech Clear. Not toxic looking HEENT: Atraumatic and Normocephalic Neck: Supple, no JVD. No cervical lymphadenopathy.  Chest: Good air entry bilaterally, CTAB.  No rales/rhonchi/wheezing CVS: S1 S2 regular, no murmurs.  Extremities: B/L Lower Ext shows no edema, both legs are warm to touch Neurology: Awake alert, and oriented X 3, CN II-XII intact, Non focal Skin: No Rash  Data Review Lab Results  Component Value Date   HGBA1C 7.4 (A) 12/05/2022   HGBA1C 11.9 (A) 08/20/2022   HGBA1C 13.4 (A) 05/16/2022    Assessment & Plan   1. Type 2 diabetes mellitus with diabetic peripheral angiopathy without gangrene, with long-term current use of insulin (HCC) Continue regimen as per discussed with Franky Macho 1 week ago.   - Glucose (CBG)  2. Essential hypertension Controlled on 2nd reading.   Sees Luke in July    The patient was given clear instructions to go to ER or return to medical center if symptoms don't improve, worsen or new problems develop. The patient verbalized understanding. The patient was told to call to get lab results if they haven't heard anything in the next week.      Georgian Co, PA-C [facility] and [facility] [place], [place] [phone]   12/11/2022, 5:49 PM

## 2022-12-15 ENCOUNTER — Other Ambulatory Visit: Payer: Self-pay | Admitting: Physician Assistant

## 2022-12-15 DIAGNOSIS — D688 Other specified coagulation defects: Secondary | ICD-10-CM

## 2022-12-15 NOTE — Progress Notes (Signed)
  Subjective:  Patient ID: Jeremy Jarvis., male    DOB: 30-Jul-1965,  MRN: [id number]  Jeremy Jarvis. presents to clinic today for at risk foot care with h/o NIDDM with ESRD on hemodialysis and painful thick toenails that are difficult to trim. Pain interferes with ambulation. Aggravating factors include wearing enclosed shoe gear. Pain is relieved with periodic professional debridement.  Chief Complaint  Patient presents with   Nail Problem    [facility] BS-did not check today A1C-7.4 PCP-[place] PCP VST-Early 2024   New problem(s): None.   PCP is Claiborne Rigg, NP.  Allergies  Allergen Reactions   Amlodipine Other (See Comments)    Dizziness    Review of Systems: Negative except as noted in the HPI.  Objective: No changes noted in today's physical examination. There were no vitals filed for this visit. Jeremy Jarvis. is a pleasant 57 y.o. male morbidly obese in NAD. AAO x 3.  Vascular Examination: Capillary refill time immediate b/l. Vascular status intact b/l with palpable pedal pulses. Pedal hair sparse b/l. No edema. No pain with calf compression b/l. Skin temperature gradient WNL b/l. No ischemia or gangrene noted b/l LE. No cyanosis or clubbing noted b/l LE.  Neurological Examination: Sensation grossly intact b/l with 10 gram monofilament. Vibratory sensation intact b/l. Pt has subjective symptoms of neuropathy.  Dermatological Examination: Pedal skin with normal turgor, texture and tone b/l.  No open wounds. No interdigital macerations.   Toenails 1-5 b/l thick, discolored, elongated with subungual debris and pain on dorsal palpation.   No hyperkeratotic nor porokeratotic lesions present on today's visit.  Musculoskeletal Examination: Normal muscle strength 5/5 to all lower extremity muscle groups bilaterally. Pes planus deformity noted bilateral LE.Marland Kitchen No pain, crepitus or joint limitation noted with ROM b/l LE.  Patient ambulates independently without assistive  aids.  Radiographs: None  Assessment/Plan: 1. Pain due to onychomycosis of toenails of both feet   2. Diabetes mellitus due to underlying condition with chronic kidney disease on chronic dialysis, with long-term current use of insulin (HCC)    -Consent given for treatment as described below: -Examined patient. -Continue foot and shoe inspections daily. Monitor blood glucose per PCP/Endocrinologist's recommendations. -Patient to continue soft, supportive shoe gear daily. -Mycotic toenails 1-5 bilaterally were debrided in length and girth with sterile nail nippers and dremel without incident. -Patient/POA to call should there be question/concern in the interim.   Return in about 3 months (around 03/12/2023).  Freddie Breech, DPM

## 2022-12-16 DIAGNOSIS — D631 Anemia in chronic kidney disease: Secondary | ICD-10-CM | POA: Diagnosis not present

## 2022-12-16 DIAGNOSIS — E1129 Type 2 diabetes mellitus with other diabetic kidney complication: Secondary | ICD-10-CM | POA: Diagnosis not present

## 2022-12-16 DIAGNOSIS — Z992 Dependence on renal dialysis: Secondary | ICD-10-CM | POA: Diagnosis not present

## 2022-12-16 DIAGNOSIS — D688 Other specified coagulation defects: Secondary | ICD-10-CM | POA: Diagnosis not present

## 2022-12-16 DIAGNOSIS — N2581 Secondary hyperparathyroidism of renal origin: Secondary | ICD-10-CM | POA: Diagnosis not present

## 2022-12-16 DIAGNOSIS — D509 Iron deficiency anemia, unspecified: Secondary | ICD-10-CM | POA: Diagnosis not present

## 2022-12-16 DIAGNOSIS — N186 End stage renal disease: Secondary | ICD-10-CM | POA: Diagnosis not present

## 2022-12-17 DIAGNOSIS — I10 Essential (primary) hypertension: Secondary | ICD-10-CM | POA: Diagnosis not present

## 2022-12-17 NOTE — Telephone Encounter (Signed)
Requested Prescriptions  Pending Prescriptions Disp Refills   [facility] ASPIRIN LOW DOSE 81 MG tablet [Pharmacy Med Name: [facility] ASPIRIN EC 81 MG TABLET] 90 tablet 0    Sig: TAKE 1 TABLET BY MOUTH EVERY DAY     Analgesics:  NSAIDS - aspirin Failed - 12/15/2022 12:18 AM      Failed - Cr in normal range and within 360 days    Creat  Date Value Ref Range Status  05/31/2016 2.77 (H) 0.60 - 1.35 mg/dL Final   Creatinine, Ser  Date Value Ref Range Status  10/30/2022 11.20 (H) 0.61 - 1.24 mg/dL Final   Creatinine, Urine  Date Value Ref Range Status  05/31/2016 52 20 - 370 mg/dL Final         Passed - eGFR is 10 or above and within 360 days    GFR, Est African American  Date Value Ref Range Status  05/31/2016 30 (L) >=60 mL/min Final   GFR calc Af Amer  Date Value Ref Range Status  08/18/2020 15 (L) >59 mL/min/1.73 Final    Comment:    **In accordance with recommendations from the NKF-ASN Task force,**   [facility] is in the process of updating its eGFR calculation to the   2021 CKD-EPI creatinine equation that estimates kidney function   without a race variable.    GFR, Est Non African American  Date Value Ref Range Status  05/31/2016 26 (L) >=60 mL/min Final   GFR, Estimated  Date Value Ref Range Status  09/07/2021 13 (L) >60 mL/min Final    Comment:    (NOTE) Calculated using the CKD-EPI Creatinine Equation (2021)    eGFR  Date Value Ref Range Status  02/04/2022 15 (L) >59 mL/min/1.73 Final         Passed - Patient is not pregnant      Passed - Valid encounter within last 12 months    Recent Outpatient Visits           6 days ago Type 2 diabetes mellitus with diabetic peripheral angiopathy without gangrene, with long-term current use of insulin [facility])   [place] [facility] [place], [place], [place]   1 week ago Type 2 diabetes mellitus with diabetic peripheral angiopathy without gangrene, with long-term current use of insulin [facility])   [place]  [facility] [employer], Cornelius Moras, RPH-CPP   1 month ago Essential hypertension   [place] [facility] [place], [place] L, RPH-CPP   3 months ago Type 2 diabetes mellitus with diabetic peripheral angiopathy without gangrene, with long-term current use of insulin [facility])   [place] [facility] [place], Shea Stakes, NP   4 months ago Essential hypertension   [facility] Drucilla Chalet, RPH-CPP       Future Appointments             In 1 month Lois Huxley, Cornelius Moras, RPH-CPP  [facility]

## 2022-12-20 ENCOUNTER — Telehealth: Payer: Self-pay

## 2022-12-20 DIAGNOSIS — Z992 Dependence on renal dialysis: Secondary | ICD-10-CM | POA: Diagnosis not present

## 2022-12-20 DIAGNOSIS — N2581 Secondary hyperparathyroidism of renal origin: Secondary | ICD-10-CM | POA: Diagnosis not present

## 2022-12-20 DIAGNOSIS — D688 Other specified coagulation defects: Secondary | ICD-10-CM | POA: Diagnosis not present

## 2022-12-20 DIAGNOSIS — N186 End stage renal disease: Secondary | ICD-10-CM | POA: Diagnosis not present

## 2022-12-20 DIAGNOSIS — D631 Anemia in chronic kidney disease: Secondary | ICD-10-CM | POA: Diagnosis not present

## 2022-12-20 DIAGNOSIS — E1129 Type 2 diabetes mellitus with other diabetic kidney complication: Secondary | ICD-10-CM | POA: Diagnosis not present

## 2022-12-20 DIAGNOSIS — I129 Hypertensive chronic kidney disease with stage 1 through stage 4 chronic kidney disease, or unspecified chronic kidney disease: Secondary | ICD-10-CM | POA: Diagnosis not present

## 2022-12-20 DIAGNOSIS — D509 Iron deficiency anemia, unspecified: Secondary | ICD-10-CM | POA: Diagnosis not present

## 2022-12-20 NOTE — Telephone Encounter (Signed)
Colin Mulders with [facility] called requesting an appt to evaluate for steal d/t worsening numbness and pain at the access site.  Reviewed pt's chart, returned call for clarification, two identifiers used. She stated that his access was used today, but it was stopped because the pt c/o so much pain. The treatment was running well. Spoke with [employer], PA who advised scheduling for a triage appt, no [place], to evaluate for steal. Brianna to call pt and confirm. Confirmed understanding.

## 2022-12-24 ENCOUNTER — Ambulatory Visit (INDEPENDENT_AMBULATORY_CARE_PROVIDER_SITE_OTHER): Payer: Medicare Other | Admitting: Physician Assistant

## 2022-12-24 VITALS — BP 184/80 | HR 69 | Temp 97.6°F | Wt 296.0 lb

## 2022-12-24 DIAGNOSIS — T82590A Other mechanical complication of surgically created arteriovenous fistula, initial encounter: Secondary | ICD-10-CM

## 2022-12-24 DIAGNOSIS — Z992 Dependence on renal dialysis: Secondary | ICD-10-CM

## 2022-12-24 DIAGNOSIS — N186 End stage renal disease: Secondary | ICD-10-CM

## 2022-12-24 NOTE — Progress Notes (Signed)
POST OPERATIVE OFFICE NOTE    CC:  F/u for surgery  HPI:  Jeremy [name] is a 57 y.o. male who is s/p left second stage basilic vein transposition by Dr. Chestine Spore on 10/30/2022.  His first stage procedure was on 08/14/2022 by Dr. Chestine Spore.  He has a history of left forearm radiocephalic fistula creation in 2021 with subsequent sidebranch ligation due to maturity issues.  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  At his first postoperative visit with our office he was doing well.  He was having some numbness in the tip of his third finger during dialysis but no other symptoms of steal.  He was cleared to use his left basilic fistula by May 1.  He returns today as a triage visit.  He states that he continues to have numbness/tingling in his left third and fourth fingertips during dialysis.  He feels like these fingers also "feel swollen".  He denies any pain in the left hand, excessive coldness, or weakness.  He states that his dialysis center has also had a difficult time cannulating his fistula since they could use it in May.  They can stick it in 1 spot in his distal upper arm and also have to use his [facility] so he can get dialysis.  They have not been able to cannulate the fistula in his upper arm, and even if they try he ends up with infiltration and severe bruising.  He dialyzes on Mondays, Wednesdays, and Fridays at [address]   Allergies  Allergen Reactions   Amlodipine Other (See Comments)    Dizziness    Current Outpatient Medications  Medication Sig Dispense Refill   atorvastatin (LIPITOR) 40 MG tablet Take 1 tablet (40 mg total) by mouth daily. 90 tablet 3   Blood Glucose Monitoring Suppl (CONTOUR NEXT EZ) w/Device KIT Use to check blood sugar three times daily. 1 kit 0   Continuous Blood Gluc Receiver (FREESTYLE LIBRE 2 READER) DEVI Use to  check blood sugar three times daily. 1 each 0   Continuous Blood Gluc Sensor (FREESTYLE LIBRE 2 SENSOR) MISC Use to check blood sugar three times daily. 2 each 3   CVS ASPIRIN LOW DOSE 81 MG tablet TAKE 1 TABLET BY MOUTH EVERY DAY 90 tablet 0   diclofenac Sodium (VOLTAREN) 1 % GEL Apply 4 g topically 4 (four) times daily. 200 g 1   Doxercalciferol (HECTOROL IV) Doxercalciferol (Hectorol)     enalapril (VASOTEC) 5 MG tablet Take 1 tablet (5 mg total) by mouth daily. 90 tablet 1   glucose blood (CONTOUR NEXT TEST) test strip Use to check blood sugar three times daily. 100 each 12   HYDROcodone-acetaminophen (NORCO) 5-325 MG tablet Take 1 tablet by mouth every 6 (six) hours as needed for moderate pain. 20 tablet 0   insulin glargine (LANTUS SOLOSTAR) 100 UNIT/ML Solostar Pen Inject 50 Units into the skin daily. 45 mL 1   insulin lispro (HUMALOG KWIKPEN) 100 UNIT/ML KwikPen Inject 6 Units into the skin 3 (three) times daily with meals. 15 mL 11   Insulin Pen Needle (B-D ULTRAFINE III SHORT PEN) 31G X 8 MM MISC 1 application  by Does not apply route 3 (three) times daily. E11.65 200 each 11   Lancet Devices (MICROLET NEXT LANCING DEVICE) MISC 1 Device by Does not apply route 3 (three) times daily. 1  each 11   levETIRAcetam (KEPPRA XR) 500 MG 24 hr tablet Take 1 tablet (500 mg total) by mouth in the morning and at bedtime. 180 tablet 1   Microlet Lancets MISC Use to check blood sugar three times daily. E11.65 100 each 3   VELPHORO 500 MG chewable tablet Chew 500 mg by mouth 3 (three) times daily with meals.     Current Facility-Administered Medications  Medication Dose Route Frequency Provider Last Rate Last Admin   0.9 %  sodium chloride infusion  250 mL Intravenous PRN Cephus Shelling, MD       sodium chloride flush (NS) 0.9 % injection 3 mL  3 mL Intravenous Q12H Cephus Shelling, MD         ROS:  See HPI  Physical Exam:  Incision:  LUE incisions well healed Extremities:  LUE fistula  with good thrill from [facility] fossa to mid upper arm. Weaker thrill in proximal arm with pulsatility. Palpable left radial pulse Neuro: intact motor and sensation of LUE    Assessment/Plan:  This is a 57 y.o. male who is s/p: left arm basilic vein transposition on 10/30/2022  -The patient was cleared to use his fistula by May 1st after transposition surgery.  He states his dialysis center has had a difficult time cannulating his fistula. They can usually get one needle in it and also have to use his [facility] so he can get dialysis. He has also had multiple infiltration events -He had some left third fingertip numbness during dialysis at his first postop visit.  He states that he still has this numbness, plus numbness of the tip of his left fourth finger.  He feels like these fingers are also swollen.  They are not swollen on exam.  He denies any excessive pain, coldness, or weakness of the left hand.  He is able to tolerate the numbness in his fingertips -He does not have any evidence of steal syndrome.  He has a palpable left radial pulse.  He has intact motor and sensation in the left hand -Given that his dialysis center has had a difficult time cannulating his fistula since superficialization, and there is pulsatility in the proximal portion of the fistula he will require fistulogram. We will schedule him for left basilic vein fistulogram in the next 2-3 weeks with Dr. Chestine Spore on a T/Th. For the time being his dialysis center should use his [facility]   Loel Dubonnet, [facility] [phone]   Clinic MD:  Steve Rattler

## 2022-12-25 DIAGNOSIS — Z992 Dependence on renal dialysis: Secondary | ICD-10-CM | POA: Diagnosis not present

## 2022-12-25 DIAGNOSIS — D688 Other specified coagulation defects: Secondary | ICD-10-CM | POA: Diagnosis not present

## 2022-12-25 DIAGNOSIS — N2581 Secondary hyperparathyroidism of renal origin: Secondary | ICD-10-CM | POA: Diagnosis not present

## 2022-12-25 DIAGNOSIS — N186 End stage renal disease: Secondary | ICD-10-CM | POA: Diagnosis not present

## 2022-12-25 DIAGNOSIS — E1129 Type 2 diabetes mellitus with other diabetic kidney complication: Secondary | ICD-10-CM | POA: Diagnosis not present

## 2022-12-25 DIAGNOSIS — D509 Iron deficiency anemia, unspecified: Secondary | ICD-10-CM | POA: Diagnosis not present

## 2022-12-25 DIAGNOSIS — D631 Anemia in chronic kidney disease: Secondary | ICD-10-CM | POA: Diagnosis not present

## 2022-12-26 ENCOUNTER — Telehealth: Payer: Self-pay

## 2022-12-26 DIAGNOSIS — N186 End stage renal disease: Secondary | ICD-10-CM | POA: Diagnosis not present

## 2022-12-26 DIAGNOSIS — I871 Compression of vein: Secondary | ICD-10-CM | POA: Diagnosis not present

## 2022-12-26 DIAGNOSIS — T82898A Other specified complication of vascular prosthetic devices, implants and grafts, initial encounter: Secondary | ICD-10-CM | POA: Diagnosis not present

## 2022-12-26 DIAGNOSIS — Z992 Dependence on renal dialysis: Secondary | ICD-10-CM | POA: Diagnosis not present

## 2022-12-26 NOTE — Telephone Encounter (Signed)
Patient returned call and stated that he had a fistulogram on today at [facility] and told that fistula can be used in a 2 weeks.

## 2022-12-26 NOTE — Telephone Encounter (Signed)
Attempted to reach patient to schedule fistulogram. Left VM for patient to return call.

## 2022-12-27 DIAGNOSIS — D631 Anemia in chronic kidney disease: Secondary | ICD-10-CM | POA: Diagnosis not present

## 2022-12-27 DIAGNOSIS — D509 Iron deficiency anemia, unspecified: Secondary | ICD-10-CM | POA: Diagnosis not present

## 2022-12-27 DIAGNOSIS — Z992 Dependence on renal dialysis: Secondary | ICD-10-CM | POA: Diagnosis not present

## 2022-12-27 DIAGNOSIS — N2581 Secondary hyperparathyroidism of renal origin: Secondary | ICD-10-CM | POA: Diagnosis not present

## 2022-12-27 DIAGNOSIS — N186 End stage renal disease: Secondary | ICD-10-CM | POA: Diagnosis not present

## 2022-12-27 DIAGNOSIS — D688 Other specified coagulation defects: Secondary | ICD-10-CM | POA: Diagnosis not present

## 2022-12-27 DIAGNOSIS — E1129 Type 2 diabetes mellitus with other diabetic kidney complication: Secondary | ICD-10-CM | POA: Diagnosis not present

## 2022-12-30 DIAGNOSIS — Z992 Dependence on renal dialysis: Secondary | ICD-10-CM | POA: Diagnosis not present

## 2022-12-30 DIAGNOSIS — D509 Iron deficiency anemia, unspecified: Secondary | ICD-10-CM | POA: Diagnosis not present

## 2022-12-30 DIAGNOSIS — E1129 Type 2 diabetes mellitus with other diabetic kidney complication: Secondary | ICD-10-CM | POA: Diagnosis not present

## 2022-12-30 DIAGNOSIS — N186 End stage renal disease: Secondary | ICD-10-CM | POA: Diagnosis not present

## 2022-12-30 DIAGNOSIS — N2581 Secondary hyperparathyroidism of renal origin: Secondary | ICD-10-CM | POA: Diagnosis not present

## 2022-12-30 DIAGNOSIS — D631 Anemia in chronic kidney disease: Secondary | ICD-10-CM | POA: Diagnosis not present

## 2022-12-30 DIAGNOSIS — D688 Other specified coagulation defects: Secondary | ICD-10-CM | POA: Diagnosis not present

## 2023-01-01 ENCOUNTER — Ambulatory Visit: Payer: Medicare Other | Attending: Nurse Practitioner

## 2023-01-01 VITALS — Ht 68.0 in | Wt 296.0 lb

## 2023-01-01 DIAGNOSIS — D509 Iron deficiency anemia, unspecified: Secondary | ICD-10-CM | POA: Diagnosis not present

## 2023-01-01 DIAGNOSIS — N2581 Secondary hyperparathyroidism of renal origin: Secondary | ICD-10-CM | POA: Diagnosis not present

## 2023-01-01 DIAGNOSIS — N186 End stage renal disease: Secondary | ICD-10-CM | POA: Diagnosis not present

## 2023-01-01 DIAGNOSIS — Z0000 Encounter for general adult medical examination without abnormal findings: Secondary | ICD-10-CM | POA: Diagnosis not present

## 2023-01-01 DIAGNOSIS — D631 Anemia in chronic kidney disease: Secondary | ICD-10-CM | POA: Diagnosis not present

## 2023-01-01 DIAGNOSIS — Z992 Dependence on renal dialysis: Secondary | ICD-10-CM | POA: Diagnosis not present

## 2023-01-01 DIAGNOSIS — E1129 Type 2 diabetes mellitus with other diabetic kidney complication: Secondary | ICD-10-CM | POA: Diagnosis not present

## 2023-01-01 DIAGNOSIS — D688 Other specified coagulation defects: Secondary | ICD-10-CM | POA: Diagnosis not present

## 2023-01-01 NOTE — Progress Notes (Signed)
Subjective:   Jeremy [name]. is a 57 y.o. male who presents for an Initial Medicare Annual Wellness Visit.  I connected with  Jeremy [name]. on 01/01/23 by a audio enabled telemedicine application and verified that I am speaking with the correct person using two identifiers.  Patient Location: Home  Provider Location: Home Office  I discussed the limitations of evaluation and management by telemedicine. The patient expressed understanding and agreed to proceed.  Review of Systems     Cardiac Risk Factors include: advanced age (>18men, >66 women);diabetes mellitus;dyslipidemia;hypertension;male gender;sedentary lifestyle     Objective:    Today's Vitals   01/01/23 1413  Weight: 296 lb (134.3 kg)  Height: 5\' 8"  (1.727 m)   Body mass index is 45.01 kg/m.     01/01/2023    2:18 PM 10/30/2022    8:23 AM 07/25/2022   10:49 AM 05/18/2021    4:31 AM 02/23/2021    7:14 AM 03/23/2020    9:56 AM 11/11/2019   11:32 AM  Advanced Directives  Does Patient Have a Medical Advance Directive? No No No No No No No  Would patient like information on creating a medical advance directive? Yes (MAU/Ambulatory/Procedural Areas - Information given) No - Patient declined No - Patient declined No - Patient declined  No - Patient declined No - Patient declined    Current Medications (verified) Outpatient Encounter Medications as of 01/01/2023  Medication Sig   atorvastatin (LIPITOR) 40 MG tablet Take 1 tablet (40 mg total) by mouth daily.   Blood Glucose Monitoring Suppl (CONTOUR NEXT EZ) w/Device KIT Use to check blood sugar three times daily.   Continuous Blood Gluc Receiver (FREESTYLE LIBRE 2 READER) DEVI Use to check blood sugar three times daily.   Continuous Blood Gluc Sensor (FREESTYLE LIBRE 2 SENSOR) MISC Use to check blood sugar three times daily.   CVS ASPIRIN LOW DOSE 81 MG tablet TAKE 1 TABLET BY MOUTH EVERY DAY   diclofenac Sodium (VOLTAREN) 1 % GEL Apply 4 g topically 4 (four) times  daily.   Doxercalciferol (HECTOROL IV) Doxercalciferol (Hectorol)   enalapril (VASOTEC) 5 MG tablet Take 1 tablet (5 mg total) by mouth daily.   glucose blood (CONTOUR NEXT TEST) test strip Use to check blood sugar three times daily.   HYDROcodone-acetaminophen (NORCO) 5-325 MG tablet Take 1 tablet by mouth every 6 (six) hours as needed for moderate pain.   insulin lispro (HUMALOG KWIKPEN) 100 UNIT/ML KwikPen Inject 6 Units into the skin 3 (three) times daily with meals.   Insulin Pen Needle (B-D ULTRAFINE III SHORT PEN) 31G X 8 MM MISC 1 application  by Does not apply route 3 (three) times daily. E11.65   Lancet Devices (MICROLET NEXT LANCING DEVICE) MISC 1 Device by Does not apply route 3 (three) times daily.   levETIRAcetam (KEPPRA XR) 500 MG 24 hr tablet Take 1 tablet (500 mg total) by mouth in the morning and at bedtime.   Microlet Lancets MISC Use to check blood sugar three times daily. E11.65   SENSIPAR 30 MG tablet Take 30 mg by mouth 3 (three) times a week.   VELPHORO 500 MG chewable tablet Chew 500 mg by mouth 3 (three) times daily with meals.   insulin glargine (LANTUS SOLOSTAR) 100 UNIT/ML Solostar Pen Inject 50 Units into the skin daily. (Patient not taking: Reported on 01/01/2023)   Facility-Administered Encounter Medications as of 01/01/2023  Medication   0.9 %  sodium chloride infusion   sodium chloride  flush (NS) 0.9 % injection 3 mL    Allergies (verified) Amlodipine   History: Past Medical History:  Diagnosis Date   Abscess    AKI (acute kidney injury) (HCC) 08/05/2017   Bell's palsy    right eye abn since dx bell's palsy   CKD (chronic kidney disease)    Dr. McDiarmind    COVID 2020   mild   Dyspnea    occasional with exertion   ESRD (end stage renal disease) (HCC)    MWF -[facility]   Headache    migraines   High cholesterol    Hypertension    Left shoulder pain 10/26/2013   S/p injection on 10/26/13    Renal insufficiency 02/14/2014   Seizures (HCC)  08/04/2017   "related to high blood sugars" (08/05/2017)   Type II diabetes mellitus (HCC)    Past Surgical History:  Procedure Laterality Date   A/V FISTULAGRAM Left 07/25/2022   Procedure: A/V Fistulagram;  Surgeon: Cephus Shelling, MD;  Location: [facility];  Service: Cardiovascular;  Laterality: Left;   AV FISTULA PLACEMENT Left 11/11/2019   Procedure: LEFT FOREARM RADIOCEPHALIC ARTERIOVENOUS (AV) FISTULA CREATION;  Surgeon: Cephus Shelling, MD;  Location: [facility] OR;  Service: Vascular;  Laterality: Left;   AV FISTULA PLACEMENT Left 08/14/2022   Procedure: FIRST STAGE BRACHIOBASILIC LEFT ARM ARTERIOVENOUS (AV) FISTULA CREATION;  Surgeon: Cephus Shelling, MD;  Location: [facility] OR;  Service: Vascular;  Laterality: Left;   BASCILIC VEIN TRANSPOSITION Left 10/30/2022   Procedure: LEFT ARM SECOND STAGE BASILIC VEIN TRANSPOSITION;  Surgeon: Cephus Shelling, MD;  Location: [facility] OR;  Service: Vascular;  Laterality: Left;   Eyelid surgery Right    I & D EXTREMITY  11/11/2011   Procedure: IRRIGATION AND DEBRIDEMENT EXTREMITY;  Surgeon: Robyne Askew, MD;  Location: [facility] OR;  Service: General;  Laterality: Right;  I&D Right Thigh Abscess   INSERTION OF DIALYSIS CATHETER N/A 07/25/2022   Procedure: INSERTION OF DIALYSIS CATHETER;  Surgeon: Cephus Shelling, MD;  Location: [facility] OR;  Service: Vascular;  Laterality: N/A;   IR FLUORO GUIDE CV LINE RIGHT  08/12/2019   IR US GUIDE VASC ACCESS RIGHT  08/12/2019   LIGATION OF ARTERIOVENOUS  FISTULA Left 08/14/2022   Procedure: LIGATION OF BRACHIOCEPHALIC ARTERIOVENOUS  FISTULA;  Surgeon: Cephus Shelling, MD;  Location: [facility] OR;  Service: Vascular;  Laterality: Left;   REVISON OF ARTERIOVENOUS FISTULA Left 03/23/2020   Procedure: LEFT ARM ARTERIOVENOUS FISTULA REVISON, LIGATION OF SIDE BRANCHES AND ELEVATION;  Surgeon: Cephus Shelling, MD;  Location: [facility] OR;  Service: Vascular;  Laterality: Left;   Family History  Problem Relation Age of Onset    Diabetes Mother    Heart disease Mother 75   Diabetes Sister    Diabetes Brother    Diabetes Brother    Cancer Maternal Uncle        type unknown   Seizures Niece    Stroke Neg Hx    Social History   Socioeconomic History   Marital status: Significant Other    Spouse name: Not on file   Number of children: 3   Years of education: Not on file   Highest education level: Not on file  Occupational History   Occupation: maintenance tech   Occupation: Disablity  Tobacco Use   Smoking status: Never    Passive exposure: Never   Smokeless tobacco: Never  Vaping Use   Vaping Use: Never used  Substance and Sexual  Activity   Alcohol use: Not Currently   Drug use: No   Sexual activity: Yes  Other Topics Concern   Not on file  Social History Narrative   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  Did not graduate high school.  Has 3 adult children in [place].  Divorced.  Lives with girlfriend Emilio Math).                   Social Determinants of Health   Financial Resource Strain: Low Risk  (01/01/2023)   Overall Financial Resource Strain (CARDIA)    Difficulty of Paying Living Expenses: Not very hard  Recent Concern: Financial Resource Strain - Medium Risk (10/24/2022)   Overall Financial Resource Strain (CARDIA)    Difficulty of Paying Living Expenses: Somewhat hard  Food Insecurity: No Food Insecurity (01/01/2023)   Hunger Vital Sign    Worried About Running Out of Food in the Last Year: Never true    Ran Out of Food in the Last Year: Never true  Transportation Needs: No Transportation Needs (01/01/2023)   PRAPARE - Administrator, Civil Service (Medical): No    Lack of Transportation (Non-Medical): No  Physical Activity: Inactive (01/01/2023)   Exercise Vital Sign    Days of Exercise per Week: 0 days    Minutes of Exercise per Session: 0 min  Stress: Stress Concern Present (01/01/2023)   [employer] of Occupational Health  - Occupational Stress Questionnaire    Feeling of Stress : To some extent  Social Connections: Moderately Integrated (01/01/2023)   Social Connection and Isolation Panel [NHANES]    Frequency of Communication with Friends and Family: Three times a week    Frequency of Social Gatherings with Friends and Family: Three times a week    Attends Religious Services: Never    Active Member of Clubs or Organizations: No    Attends Engineer, structural: More than 4 times per year    Marital Status: Living with partner  Recent Concern: Social Connections - Socially Isolated (10/24/2022)   Social Connection and Isolation Panel [NHANES]    Frequency of Communication with Friends and Family: Three times a week    Frequency of Social Gatherings with Friends and Family: Three times a week    Attends Religious Services: Never    Active Member of Clubs or Organizations: No    Attends Engineer, structural: Never    Marital Status: Divorced    Tobacco Counseling Counseling given: Not Answered   Clinical Intake:  Pre-visit preparation completed: Yes  Pain : No/denies pain  Diabetes: Yes CBG done?: No Did pt. bring in CBG monitor from home?: No  How often do you need to have someone help you when you read instructions, pamphlets, or other written materials from your doctor or pharmacy?: 1 - Never  Diabetic?Yes   Nutrition Risk Assessment:  Has the patient had any N/V/D within the last 2 months?  No  Does the patient have any non-healing wounds?  No  Has the patient had any unintentional weight loss or weight gain?  No   Diabetes:  Is the patient diabetic?  Yes  If diabetic, was a CBG obtained today?  No  Did the patient bring in their glucometer from home?  No  How often do you monitor your CBG's? Daily and as needed .   Financial Strains and Diabetes Management:  Are you having any financial strains with the device, your supplies or  your medication? No .  Does the  patient want to be seen by Chronic Care Management for management of their diabetes?  No  Would the patient like to be referred to a Nutritionist or for Diabetic Management?  No   Diabetic Exams:  Diabetic Eye Exam: Completed records requested  Diabetic Foot Exam: Completed 08/27/22   Interpreter Needed?: No  Information entered by :: Kandis Fantasia LPN   Activities of Daily Living    01/01/2023    2:18 PM 10/30/2022    8:25 AM  In your present state of health, do you have any difficulty performing the following activities:  Hearing? 0 0  Vision? 0 0  Difficulty concentrating or making decisions? 0 0  Walking or climbing stairs? 0 0  Dressing or bathing? 0 0  Doing errands, shopping? 0   Preparing Food and eating ? N   Using the Toilet? N   In the past six months, have you accidently leaked urine? N   Do you have problems with loss of bowel control? N   Managing your Medications? N   Managing your Finances? N   Housekeeping or managing your Housekeeping? N     Patient Care Team: Claiborne Rigg, NP as PCP - General (Nurse Practitioner) Antony Contras, MD as Consulting Physician (Ophthalmology) Etta Quill, MD as Consulting Physician [facility] Ophthalmology) Berenice Primas, [facility] Freddie Breech, DPM as Consulting Physician (Podiatry) Cephus Shelling, MD as Consulting Physician (Vascular Surgery) Tyrell Antonio, MD as Consulting Physician (Physical Medicine and Rehabilitation) Butch Penny, NP as Registered Nurse (Neurology)  Indicate any recent Medical Services you may have received from other than [facility] providers in the past year (date may be approximate).     Assessment:   This is a routine wellness examination for Bright.  Hearing/Vision screen Hearing Screening - Comments:: Denies hearing difficulties   Vision Screening - Comments:: up to date with routine eye exams with [facility]     Dietary issues and  exercise activities discussed: Current Exercise Habits: The patient does not participate in regular exercise at present   Goals Addressed             This Visit's Progress    Increase physical activity        Depression Screen    01/01/2023    2:16 PM 12/11/2022    3:42 PM 10/24/2022    2:20 PM 05/16/2022    9:44 AM 01/01/2021   11:04 AM 12/05/2020    3:32 PM 10/26/2019    9:01 AM  PHQ 2/9 Scores  PHQ - 2 Score 0  0 0 0 0 0  PHQ- 9 Score     2 0 0  Exception Documentation  Patient refusal         Fall Risk    01/01/2023    2:14 PM 12/11/2022    3:42 PM 08/20/2022    8:39 AM 05/16/2022    9:44 AM 01/21/2022    1:23 PM  Fall Risk   Falls in the past year? 0 0 0 0 0  Number falls in past yr: 0 0 0 0 0  Injury with Fall? 0 0 0 0 0  Risk for fall due to : No Fall Risks No Fall Risks No Fall Risks No Fall Risks   Follow up Falls prevention discussed;Education provided;Falls evaluation completed  Falls evaluation completed      FALL RISK PREVENTION PERTAINING TO THE HOME:  Any stairs in or  around the home? No  If so, are there any without handrails? No  Home free of loose throw rugs in walkways, pet beds, electrical cords, etc? Yes  Adequate lighting in your home to reduce risk of falls? Yes   ASSISTIVE DEVICES UTILIZED TO PREVENT FALLS:  Life alert? No  Use of a cane, [place] or w/c? No  Grab bars in the bathroom? Yes  Shower chair or bench in shower? No  Elevated toilet seat or a handicapped toilet? No   TIMED UP AND GO:  Was the test performed? No . Telephonic visit   Cognitive Function:        01/01/2023    2:18 PM  6CIT Screen  What Year? 0 points  What month? 0 points  What time? 0 points  Count back from 20 0 points  Months in reverse 0 points  Repeat phrase 0 points  Total Score 0 points    Immunizations Immunization History  Administered Date(s) Administered   Hepatitis B, ADULT 09/10/2019   Hepb-cpg 01/13/2020, 03/08/2020, 04/07/2020, 06/09/2020    Influenza Split 04/28/2013   Influenza, Quadrivalent, Recombinant, Inj, Pf 05/01/2022   Influenza,inj,Quad PF,6+ Mos 03/14/2016, 04/17/2017, 04/02/2018, 05/25/2019, 04/27/2021   Moderna Sars-Covid-2 Vaccination 10/08/2019, 11/05/2019   Pneumococcal Polysaccharide-23 03/14/2016, 08/11/2017   Tdap 03/14/2016   Zoster Recombinat (Shingrix) 08/20/2022    TDAP status: Up to date  Pneumococcal vaccine status: Up to date  Covid-19 vaccine status: Information provided on how to obtain vaccines.   Qualifies for Shingles Vaccine? Yes   Zostavax completed No   Shingrix Completed?: No.    Education has been provided regarding the importance of this vaccine. Patient has been advised to call insurance company to determine out of pocket expense if they have not yet received this vaccine. Advised may also receive vaccine at local pharmacy or Health Dept. Verbalized acceptance and understanding.  Screening Tests Health Maintenance  Topic Date Due   Hepatitis C Screening  Never done   Colonoscopy  Never done   COVID-19 Vaccine (3 - Moderna risk series) 12/03/2019   OPHTHALMOLOGY EXAM  05/02/2022   Zoster Vaccines- Shingrix (2 of 2) 10/15/2022   INFLUENZA VACCINE  02/20/2023   HEMOGLOBIN A1C  06/07/2023   FOOT EXAM  08/28/2023   Medicare Annual Wellness (AWV)  01/01/2024   DTaP/Tdap/Td (2 - Td or Tdap) 03/14/2026   HIV Screening  Completed   HPV VACCINES  Aged Out    Health Maintenance  Health Maintenance Due  Topic Date Due   Hepatitis C Screening  Never done   Colonoscopy  Never done   COVID-19 Vaccine (3 - Moderna risk series) 12/03/2019   OPHTHALMOLOGY EXAM  05/02/2022   Zoster Vaccines- Shingrix (2 of 2) 10/15/2022   Colorectal cancer screening:  Patient declines at this time   Lung Cancer Screening: (Low Dose CT Chest recommended if Age 75-80 years, 30 pack-year currently smoking OR have quit w/in 15years.) does not qualify.   Lung Cancer Screening Referral: n/a  Additional  Screening:  Hepatitis C Screening: does qualify;   Vision Screening: Recommended annual ophthalmology exams for early detection of glaucoma and other disorders of the eye. Is the patient up to date with their annual eye exam?  Yes  Who is the provider or what is the name of the office in which the patient attends annual eye exams? [facility]  If pt is not established with a provider, would they like to be referred to a provider to establish care? No .  Dental Screening: Recommended annual dental exams for proper oral hygiene  Community Resource Referral / Chronic Care Management: CRR required this visit?  No   CCM required this visit?  No      Plan:     I have personally reviewed and noted the following in the patient's chart:   Medical and social history Use of alcohol, tobacco or illicit drugs  Current medications and supplements including opioid prescriptions. Patient is not currently taking opioid prescriptions. Functional ability and status Nutritional status Physical activity Advanced directives List of other physicians Hospitalizations, surgeries, and ER visits in previous 12 months Vitals Screenings to include cognitive, depression, and falls Referrals and appointments  In addition, I have reviewed and discussed with patient certain preventive protocols, quality metrics, and best practice recommendations. A written personalized care plan for preventive services as well as general preventive health recommendations were provided to patient.     Durwin Nora, [place]   1/61/0960   Due to this being a virtual visit, the after visit summary with patients personalized plan was offered to patient via mail or my-chart. per request, patient was mailed a copy of AVS.  Nurse Notes: Patient has concerns of increased fatigue and problems with insomnia.  Next appointment is 02/04/23.

## 2023-01-01 NOTE — Patient Instructions (Addendum)
[name] , Thank you for taking time to come for your Medicare Wellness Visit. I appreciate your ongoing commitment to your health goals. Please review the following plan we discussed and let me know if I can assist you in the future.   These are the goals we discussed:  Goals       " I am not sure why my blood sugars stay elevated" (pt-stated)      Current Barriers:  Chronic Disease Management support, education, and care coordination needs related to HTN, DMII, and CKD Stage IV  Clinical Goal(s) related to HTN, DMII, and CKD Stage IV :  Over the next 30 days, patient will:  Work with the care management team to address educational, disease management, and care coordination needs  Begin or continue self health monitoring activities as directed today Measure and record cbg (blood glucose) bid times daily and Measure and record blood pressure 5 times per week Call care management team with questions or concerns Verbalize basic understanding of patient centered plan of care established today  Interventions related to HTN, DMII, and CKD Stage IV :  Evaluation of current treatment plans and patient's adherence to plan as established by provider Assessed patient understanding of disease states Assessed patient's education and care coordination needs Provided disease specific education to patient  Collaborated with appropriate clinical care team members regarding patient needs, patient will talk with talk with [place] concerning needs with insurance. Continue light exercise around home Continue cutting down on consumption of soda Follow up with pharmacy re: medication assistance  09/14/19 Spoke with the patient and he states that he is doing well.  He is continuing to check his blood sugars daily.  He states that his highest has been 283  but he knows why his blood sugar are that high.  He was satisfying a craving.  Discussed with the patient about talking with the nutritionist he has access to at  the dialysis center.  Discuss foods that are good for both diabetes and renal diet. He states that he is still working with pharmacy regarding his medication assistance He has an appointment with Dr. Ethelene Browns on 7/19 in the office He is still currently working to receive his disability He has CCM number to call with any questions or concerns  Patient Self Care Activities related to HTN, DMII, and CKD Stage IV :  Patient is unable to independently self-manage chronic health conditions  Initial goal documentation       Increase physical activity      Needs community resources      Current Barriers:  Patient with HTN and CKD Stage IV  needs community resources,  Acknowledges deficits and needs support, education and care coordination in order to meet this unmet need  Lacks knowledge of community resource: to assist with rent and utilities Received last pay check and insurance will end this month Clinical Goal(s)  Over the next 30 days, patient will contact agencies discussed for community support  Interventions provided by LCSW:  Assessed patient's needs and discussed ongoing care management follow up  Provided patient with information and phone number for  Medication Assistance Program [phone], Department of Social Services [phone],  [facility] [phone], [facility] [phone] and the [place] of [address] Act program [phone] Discussed support system with social worker at Dialysis for Medicare and Medicaid questions  Collaborated with RN care manager regarding patient needs Patient Self Care Activities & Deficits:  Patient is unable to independently navigate community resource  options without care coordination support  Acknowledges deficits and is motivated to resolve concern  Patient is able to contact the agencies discussed today Initial goal documentation        This is a list of the screening recommended for you and due dates:  Health  Maintenance  Topic Date Due   Hepatitis C Screening  Never done   Colon Cancer Screening  Never done   COVID-19 Vaccine (3 - Moderna risk series) 12/03/2019   Eye exam for diabetics  05/02/2022   Zoster (Shingles) Vaccine (2 of 2) 10/15/2022   Flu Shot  02/20/2023   Hemoglobin A1C  06/07/2023   Complete foot exam   08/28/2023   Medicare Annual Wellness Visit  01/01/2024   DTaP/Tdap/Td vaccine (2 - Td or Tdap) 03/14/2026   HIV Screening  Completed   HPV Vaccine  Aged Out    Advanced directives: Information on Advanced Care Planning can be found at [facility] Advance Health Care Directives Advance Health Care Directives ([email or web address] Please bring a copy of your health care power of attorney and living will to the office to be added to your chart at your convenience.  Conditions/risks identified: Aim for 30 minutes of exercise or brisk walking, 6-8 glasses of water, and 5 servings of fruits and vegetables each day.  Next appointment: Follow up in one year for your annual wellness visit   Preventive Care 40-64 Years, Male Preventive care refers to lifestyle choices and visits with your health care provider that can promote health and wellness. What does preventive care include? A yearly physical exam. This is also called an annual well check. Dental exams once or twice a year. Routine eye exams. Ask your health care provider how often you should have your eyes checked. Personal lifestyle choices, including: Daily care of your teeth and gums. Regular physical activity. Eating a healthy diet. Avoiding tobacco and drug use. Limiting alcohol use. Practicing safe sex. Taking low-dose aspirin every day starting at age 16. What happens during an annual well check? The services and screenings done by your health care provider during your annual well check will depend on your age, overall health, lifestyle risk factors, and family history of disease. Counseling  Your  health care provider may ask you questions about your: Alcohol use. Tobacco use. Drug use. Emotional well-being. Home and relationship well-being. Sexual activity. Eating habits. Work and work Astronomer. Screening  You may have the following tests or measurements: Height, weight, and BMI. Blood pressure. Lipid and cholesterol levels. These may be checked every 5 years, or more frequently if you are over 27 years old. Skin check. Lung cancer screening. You may have this screening every year starting at age 56 if you have a 30-pack-year history of smoking and currently smoke or have quit within the past 15 years. Fecal occult blood test (FOBT) of the stool. You may have this test every year starting at age 40. Flexible sigmoidoscopy or colonoscopy. You may have a sigmoidoscopy every 5 years or a colonoscopy every 10 years starting at age 82. Prostate cancer screening. Recommendations will vary depending on your family history and other risks. Hepatitis C blood test. Hepatitis B blood test. Sexually transmitted disease (STD) testing. Diabetes screening. This is done by checking your blood sugar (glucose) after you have not eaten for a while (fasting). You may have this done every 1-3 years. Discuss your test results, treatment options, and if necessary, the need for more tests with your health  care provider. Vaccines  Your health care provider may recommend certain vaccines, such as: Influenza vaccine. This is recommended every year. Tetanus, diphtheria, and acellular pertussis (Tdap, Td) vaccine. You may need a Td booster every 10 years. Zoster vaccine. You may need this after age 15. Pneumococcal 13-valent conjugate (PCV13) vaccine. You may need this if you have certain conditions and have not been vaccinated. Pneumococcal polysaccharide (PPSV23) vaccine. You may need one or two doses if you smoke cigarettes or if you have certain conditions. Talk to your health care provider about  which screenings and vaccines you need and how often you need them. This information is not intended to replace advice given to you by your health care provider. Make sure you discuss any questions you have with your health care provider. Document Released: 08/04/2015 Document Revised: 03/27/2016 Document Reviewed: 05/09/2015 Elsevier Interactive Patient Education  2017 [employer].  Fall Prevention in the Home Falls can cause injuries. They can happen to people of all ages. There are many things you can do to make your home safe and to help prevent falls. What can I do on the outside of my home? Regularly fix the edges of walkways and driveways and fix any cracks. Remove anything that might make you trip as you walk through a door, such as a raised step or threshold. Trim any bushes or trees on the path to your home. Use bright outdoor lighting. Clear any walking paths of anything that might make someone trip, such as rocks or tools. Regularly check to see if handrails are loose or broken. Make sure that both sides of any steps have handrails. Any raised decks and porches should have guardrails on the edges. Have any leaves, snow, or ice cleared regularly. Use sand or salt on walking paths during winter. Clean up any spills in your garage right away. This includes oil or grease spills. What can I do in the bathroom? Use night lights. Install grab bars by the toilet and in the tub and shower. Do not use towel bars as grab bars. Use non-skid mats or decals in the tub or shower. If you need to sit down in the shower, use a plastic, non-slip stool. Keep the floor dry. Clean up any water that spills on the floor as soon as it happens. Remove soap buildup in the tub or shower regularly. Attach bath mats securely with double-sided non-slip rug tape. Do not have throw rugs and other things on the floor that can make you trip. What can I do in the bedroom? Use night lights. Make sure that you  have a light by your bed that is easy to reach. Do not use any sheets or blankets that are too big for your bed. They should not hang down onto the floor. Have a firm chair that has side arms. You can use this for support while you get dressed. Do not have throw rugs and other things on the floor that can make you trip. What can I do in the kitchen? Clean up any spills right away. Avoid walking on wet floors. Keep items that you use a lot in easy-to-reach places. If you need to reach something above you, use a strong step stool that has a grab bar. Keep electrical cords out of the way. Do not use floor polish or wax that makes floors slippery. If you must use wax, use non-skid floor wax. Do not have throw rugs and other things on the floor that can make you  trip. What can I do with my stairs? Do not leave any items on the stairs. Make sure that there are handrails on both sides of the stairs and use them. Fix handrails that are broken or loose. Make sure that handrails are as long as the stairways. Check any carpeting to make sure that it is firmly attached to the stairs. Fix any carpet that is loose or worn. Avoid having throw rugs at the top or bottom of the stairs. If you do have throw rugs, attach them to the floor with carpet tape. Make sure that you have a light switch at the top of the stairs and the bottom of the stairs. If you do not have them, ask someone to add them for you. What else can I do to help prevent falls? Wear shoes that: Do not have high heels. Have rubber bottoms. Are comfortable and fit you well. Are closed at the toe. Do not wear sandals. If you use a stepladder: Make sure that it is fully opened. Do not climb a closed stepladder. Make sure that both sides of the stepladder are locked into place. Ask someone to hold it for you, if possible. Clearly mark and make sure that you can see: Any grab bars or handrails. First and last steps. Where the edge of each  step is. Use tools that help you move around (mobility aids) if they are needed. These include: Canes. Walkers. Scooters. Crutches. Turn on the lights when you go into a dark area. Replace any light bulbs as soon as they burn out. Set up your furniture so you have a clear path. Avoid moving your furniture around. If any of your floors are uneven, fix them. If there are any pets around you, be aware of where they are. Review your medicines with your doctor. Some medicines can make you feel dizzy. This can increase your chance of falling. Ask your doctor what other things that you can do to help prevent falls. This information is not intended to replace advice given to you by your health care provider. Make sure you discuss any questions you have with your health care provider. Document Released: 05/04/2009 Document Revised: 12/14/2015 Document Reviewed: 08/12/2014 Elsevier Interactive Patient Education  2017 [employer]. Thank you for enrolling in [facility]. Please follow the instructions below to securely access your online medical record. MyChart allows you to send messages to your doctor, view your test results, manage appointments, and more.   How Do I Sign Up? In your Internet browser, go to [employer] and enter [email or web address]. Click on the Sign Up Now link in the Sign In box. You will see the New Member Sign Up page. Enter your MyChart Access Code exactly as it appears below. You will not need to use this code after you've completed the sign-up process. If you do not sign up before the expiration date, you must request a new code.  MyChart Access Code: [email or web address] Expires: 01/16/2023  3:33 AM  Enter your Social Security Number ([id number]) and Date of Birth (mm/dd/yyyy) as indicated and click Submit. You will be taken to the next sign-up page. Create a MyChart ID. This will be your MyChart login ID and cannot be changed, so think of one that is secure and easy  to remember. Create a Clinical biochemist. You can change your password at any time. Enter your Password Reset Question and Answer. This can be used at a later time if you forget your password.  Enter your e-mail  address. You will receive e-mail notification when new information is available in MyChart. Click Sign Up. You can now view your medical record.   Additional Information Remember, MyChart is NOT to be used for urgent needs. For medical emergencies, dial 911.

## 2023-01-03 DIAGNOSIS — D631 Anemia in chronic kidney disease: Secondary | ICD-10-CM | POA: Diagnosis not present

## 2023-01-03 DIAGNOSIS — Z992 Dependence on renal dialysis: Secondary | ICD-10-CM | POA: Diagnosis not present

## 2023-01-03 DIAGNOSIS — D509 Iron deficiency anemia, unspecified: Secondary | ICD-10-CM | POA: Diagnosis not present

## 2023-01-03 DIAGNOSIS — N186 End stage renal disease: Secondary | ICD-10-CM | POA: Diagnosis not present

## 2023-01-03 DIAGNOSIS — E1129 Type 2 diabetes mellitus with other diabetic kidney complication: Secondary | ICD-10-CM | POA: Diagnosis not present

## 2023-01-03 DIAGNOSIS — D688 Other specified coagulation defects: Secondary | ICD-10-CM | POA: Diagnosis not present

## 2023-01-03 DIAGNOSIS — N2581 Secondary hyperparathyroidism of renal origin: Secondary | ICD-10-CM | POA: Diagnosis not present

## 2023-01-06 DIAGNOSIS — D631 Anemia in chronic kidney disease: Secondary | ICD-10-CM | POA: Diagnosis not present

## 2023-01-06 DIAGNOSIS — D688 Other specified coagulation defects: Secondary | ICD-10-CM | POA: Diagnosis not present

## 2023-01-06 DIAGNOSIS — N2581 Secondary hyperparathyroidism of renal origin: Secondary | ICD-10-CM | POA: Diagnosis not present

## 2023-01-06 DIAGNOSIS — Z992 Dependence on renal dialysis: Secondary | ICD-10-CM | POA: Diagnosis not present

## 2023-01-06 DIAGNOSIS — N186 End stage renal disease: Secondary | ICD-10-CM | POA: Diagnosis not present

## 2023-01-06 DIAGNOSIS — D509 Iron deficiency anemia, unspecified: Secondary | ICD-10-CM | POA: Diagnosis not present

## 2023-01-06 DIAGNOSIS — E1129 Type 2 diabetes mellitus with other diabetic kidney complication: Secondary | ICD-10-CM | POA: Diagnosis not present

## 2023-01-08 DIAGNOSIS — D688 Other specified coagulation defects: Secondary | ICD-10-CM | POA: Diagnosis not present

## 2023-01-08 DIAGNOSIS — D631 Anemia in chronic kidney disease: Secondary | ICD-10-CM | POA: Diagnosis not present

## 2023-01-08 DIAGNOSIS — D509 Iron deficiency anemia, unspecified: Secondary | ICD-10-CM | POA: Diagnosis not present

## 2023-01-08 DIAGNOSIS — E1129 Type 2 diabetes mellitus with other diabetic kidney complication: Secondary | ICD-10-CM | POA: Diagnosis not present

## 2023-01-08 DIAGNOSIS — Z992 Dependence on renal dialysis: Secondary | ICD-10-CM | POA: Diagnosis not present

## 2023-01-08 DIAGNOSIS — N2581 Secondary hyperparathyroidism of renal origin: Secondary | ICD-10-CM | POA: Diagnosis not present

## 2023-01-08 DIAGNOSIS — N186 End stage renal disease: Secondary | ICD-10-CM | POA: Diagnosis not present

## 2023-01-10 DIAGNOSIS — D688 Other specified coagulation defects: Secondary | ICD-10-CM | POA: Diagnosis not present

## 2023-01-10 DIAGNOSIS — N2581 Secondary hyperparathyroidism of renal origin: Secondary | ICD-10-CM | POA: Diagnosis not present

## 2023-01-10 DIAGNOSIS — D509 Iron deficiency anemia, unspecified: Secondary | ICD-10-CM | POA: Diagnosis not present

## 2023-01-10 DIAGNOSIS — E1129 Type 2 diabetes mellitus with other diabetic kidney complication: Secondary | ICD-10-CM | POA: Diagnosis not present

## 2023-01-10 DIAGNOSIS — D631 Anemia in chronic kidney disease: Secondary | ICD-10-CM | POA: Diagnosis not present

## 2023-01-10 DIAGNOSIS — Z992 Dependence on renal dialysis: Secondary | ICD-10-CM | POA: Diagnosis not present

## 2023-01-10 DIAGNOSIS — N186 End stage renal disease: Secondary | ICD-10-CM | POA: Diagnosis not present

## 2023-01-13 DIAGNOSIS — D509 Iron deficiency anemia, unspecified: Secondary | ICD-10-CM | POA: Diagnosis not present

## 2023-01-13 DIAGNOSIS — D688 Other specified coagulation defects: Secondary | ICD-10-CM | POA: Diagnosis not present

## 2023-01-13 DIAGNOSIS — Z992 Dependence on renal dialysis: Secondary | ICD-10-CM | POA: Diagnosis not present

## 2023-01-13 DIAGNOSIS — N186 End stage renal disease: Secondary | ICD-10-CM | POA: Diagnosis not present

## 2023-01-13 DIAGNOSIS — D631 Anemia in chronic kidney disease: Secondary | ICD-10-CM | POA: Diagnosis not present

## 2023-01-13 DIAGNOSIS — E1129 Type 2 diabetes mellitus with other diabetic kidney complication: Secondary | ICD-10-CM | POA: Diagnosis not present

## 2023-01-13 DIAGNOSIS — N2581 Secondary hyperparathyroidism of renal origin: Secondary | ICD-10-CM | POA: Diagnosis not present

## 2023-01-15 DIAGNOSIS — D509 Iron deficiency anemia, unspecified: Secondary | ICD-10-CM | POA: Diagnosis not present

## 2023-01-15 DIAGNOSIS — D688 Other specified coagulation defects: Secondary | ICD-10-CM | POA: Diagnosis not present

## 2023-01-15 DIAGNOSIS — Z992 Dependence on renal dialysis: Secondary | ICD-10-CM | POA: Diagnosis not present

## 2023-01-15 DIAGNOSIS — N186 End stage renal disease: Secondary | ICD-10-CM | POA: Diagnosis not present

## 2023-01-15 DIAGNOSIS — D631 Anemia in chronic kidney disease: Secondary | ICD-10-CM | POA: Diagnosis not present

## 2023-01-15 DIAGNOSIS — E1129 Type 2 diabetes mellitus with other diabetic kidney complication: Secondary | ICD-10-CM | POA: Diagnosis not present

## 2023-01-15 DIAGNOSIS — N2581 Secondary hyperparathyroidism of renal origin: Secondary | ICD-10-CM | POA: Diagnosis not present

## 2023-01-16 ENCOUNTER — Telehealth: Payer: Self-pay | Admitting: Physical Medicine and Rehabilitation

## 2023-01-16 NOTE — Telephone Encounter (Signed)
Pt is still having pack pain and would like to set appt.

## 2023-01-17 DIAGNOSIS — E1129 Type 2 diabetes mellitus with other diabetic kidney complication: Secondary | ICD-10-CM | POA: Diagnosis not present

## 2023-01-17 DIAGNOSIS — D688 Other specified coagulation defects: Secondary | ICD-10-CM | POA: Diagnosis not present

## 2023-01-17 DIAGNOSIS — D509 Iron deficiency anemia, unspecified: Secondary | ICD-10-CM | POA: Diagnosis not present

## 2023-01-17 DIAGNOSIS — N2581 Secondary hyperparathyroidism of renal origin: Secondary | ICD-10-CM | POA: Diagnosis not present

## 2023-01-17 DIAGNOSIS — N186 End stage renal disease: Secondary | ICD-10-CM | POA: Diagnosis not present

## 2023-01-17 DIAGNOSIS — Z992 Dependence on renal dialysis: Secondary | ICD-10-CM | POA: Diagnosis not present

## 2023-01-17 DIAGNOSIS — D631 Anemia in chronic kidney disease: Secondary | ICD-10-CM | POA: Diagnosis not present

## 2023-01-19 DIAGNOSIS — Z992 Dependence on renal dialysis: Secondary | ICD-10-CM | POA: Diagnosis not present

## 2023-01-19 DIAGNOSIS — I129 Hypertensive chronic kidney disease with stage 1 through stage 4 chronic kidney disease, or unspecified chronic kidney disease: Secondary | ICD-10-CM | POA: Diagnosis not present

## 2023-01-19 DIAGNOSIS — N186 End stage renal disease: Secondary | ICD-10-CM | POA: Diagnosis not present

## 2023-01-20 ENCOUNTER — Other Ambulatory Visit: Payer: Self-pay | Admitting: Physical Medicine and Rehabilitation

## 2023-01-20 DIAGNOSIS — Z992 Dependence on renal dialysis: Secondary | ICD-10-CM | POA: Diagnosis not present

## 2023-01-20 DIAGNOSIS — M5416 Radiculopathy, lumbar region: Secondary | ICD-10-CM

## 2023-01-20 DIAGNOSIS — N2581 Secondary hyperparathyroidism of renal origin: Secondary | ICD-10-CM | POA: Diagnosis not present

## 2023-01-20 DIAGNOSIS — D688 Other specified coagulation defects: Secondary | ICD-10-CM | POA: Diagnosis not present

## 2023-01-20 DIAGNOSIS — D509 Iron deficiency anemia, unspecified: Secondary | ICD-10-CM | POA: Diagnosis not present

## 2023-01-20 DIAGNOSIS — E1129 Type 2 diabetes mellitus with other diabetic kidney complication: Secondary | ICD-10-CM | POA: Diagnosis not present

## 2023-01-20 DIAGNOSIS — N186 End stage renal disease: Secondary | ICD-10-CM | POA: Diagnosis not present

## 2023-01-20 NOTE — Telephone Encounter (Signed)
Spoke with patient and he is having the same type of pain he was having before. He is requesting an injection and would like it soon. He stated the last injection in 02/2022 lasted until a couple months ago and he received 75% relief. Informed him of the next available and he wants it done sooner. Advised patient that we can send him to [facility] for the injection and he agreed. Please advise

## 2023-01-22 DIAGNOSIS — N2581 Secondary hyperparathyroidism of renal origin: Secondary | ICD-10-CM | POA: Diagnosis not present

## 2023-01-22 DIAGNOSIS — N186 End stage renal disease: Secondary | ICD-10-CM | POA: Diagnosis not present

## 2023-01-22 DIAGNOSIS — D509 Iron deficiency anemia, unspecified: Secondary | ICD-10-CM | POA: Diagnosis not present

## 2023-01-22 DIAGNOSIS — E1129 Type 2 diabetes mellitus with other diabetic kidney complication: Secondary | ICD-10-CM | POA: Diagnosis not present

## 2023-01-22 DIAGNOSIS — Z992 Dependence on renal dialysis: Secondary | ICD-10-CM | POA: Diagnosis not present

## 2023-01-22 DIAGNOSIS — D688 Other specified coagulation defects: Secondary | ICD-10-CM | POA: Diagnosis not present

## 2023-01-24 DIAGNOSIS — Z992 Dependence on renal dialysis: Secondary | ICD-10-CM | POA: Diagnosis not present

## 2023-01-24 DIAGNOSIS — D688 Other specified coagulation defects: Secondary | ICD-10-CM | POA: Diagnosis not present

## 2023-01-24 DIAGNOSIS — E1129 Type 2 diabetes mellitus with other diabetic kidney complication: Secondary | ICD-10-CM | POA: Diagnosis not present

## 2023-01-24 DIAGNOSIS — N186 End stage renal disease: Secondary | ICD-10-CM | POA: Diagnosis not present

## 2023-01-24 DIAGNOSIS — D509 Iron deficiency anemia, unspecified: Secondary | ICD-10-CM | POA: Diagnosis not present

## 2023-01-24 DIAGNOSIS — N2581 Secondary hyperparathyroidism of renal origin: Secondary | ICD-10-CM | POA: Diagnosis not present

## 2023-01-27 DIAGNOSIS — E1129 Type 2 diabetes mellitus with other diabetic kidney complication: Secondary | ICD-10-CM | POA: Diagnosis not present

## 2023-01-27 DIAGNOSIS — D509 Iron deficiency anemia, unspecified: Secondary | ICD-10-CM | POA: Diagnosis not present

## 2023-01-27 DIAGNOSIS — Z992 Dependence on renal dialysis: Secondary | ICD-10-CM | POA: Diagnosis not present

## 2023-01-27 DIAGNOSIS — N2581 Secondary hyperparathyroidism of renal origin: Secondary | ICD-10-CM | POA: Diagnosis not present

## 2023-01-27 DIAGNOSIS — N186 End stage renal disease: Secondary | ICD-10-CM | POA: Diagnosis not present

## 2023-01-27 DIAGNOSIS — D688 Other specified coagulation defects: Secondary | ICD-10-CM | POA: Diagnosis not present

## 2023-01-28 DIAGNOSIS — T82858A Stenosis of vascular prosthetic devices, implants and grafts, initial encounter: Secondary | ICD-10-CM | POA: Diagnosis not present

## 2023-01-28 DIAGNOSIS — I871 Compression of vein: Secondary | ICD-10-CM | POA: Diagnosis not present

## 2023-01-28 DIAGNOSIS — N186 End stage renal disease: Secondary | ICD-10-CM | POA: Diagnosis not present

## 2023-01-28 DIAGNOSIS — Z992 Dependence on renal dialysis: Secondary | ICD-10-CM | POA: Diagnosis not present

## 2023-01-29 DIAGNOSIS — E1129 Type 2 diabetes mellitus with other diabetic kidney complication: Secondary | ICD-10-CM | POA: Diagnosis not present

## 2023-01-29 DIAGNOSIS — N186 End stage renal disease: Secondary | ICD-10-CM | POA: Diagnosis not present

## 2023-01-29 DIAGNOSIS — D688 Other specified coagulation defects: Secondary | ICD-10-CM | POA: Diagnosis not present

## 2023-01-29 DIAGNOSIS — N2581 Secondary hyperparathyroidism of renal origin: Secondary | ICD-10-CM | POA: Diagnosis not present

## 2023-01-29 DIAGNOSIS — Z992 Dependence on renal dialysis: Secondary | ICD-10-CM | POA: Diagnosis not present

## 2023-01-29 DIAGNOSIS — D509 Iron deficiency anemia, unspecified: Secondary | ICD-10-CM | POA: Diagnosis not present

## 2023-01-30 ENCOUNTER — Ambulatory Visit
Admission: RE | Admit: 2023-01-30 | Discharge: 2023-01-30 | Disposition: A | Discharge status: Home / Self Care | Payer: Medicare Other | Source: Ambulatory Visit | Attending: Physical Medicine and Rehabilitation | Admitting: Physical Medicine and Rehabilitation

## 2023-01-30 DIAGNOSIS — M5416 Radiculopathy, lumbar region: Secondary | ICD-10-CM

## 2023-01-30 DIAGNOSIS — M4727 Other spondylosis with radiculopathy, lumbosacral region: Secondary | ICD-10-CM | POA: Diagnosis not present

## 2023-01-30 MED ORDER — IOPAMIDOL (ISOVUE-M 200) INJECTION 41%
1.0000 mL | Freq: Once | INTRAMUSCULAR | Status: AC
  Administered 2023-01-30: 1 mL via EPIDURAL

## 2023-01-30 MED ORDER — METHYLPREDNISOLONE ACETATE 40 MG/ML INJ SUSP (RADIOLOG
80.0000 mg | Freq: Once | INTRAMUSCULAR | Status: AC
  Administered 2023-01-30: 80 mg via EPIDURAL

## 2023-01-30 NOTE — Discharge Instructions (Signed)

## 2023-01-31 DIAGNOSIS — D688 Other specified coagulation defects: Secondary | ICD-10-CM | POA: Diagnosis not present

## 2023-01-31 DIAGNOSIS — N186 End stage renal disease: Secondary | ICD-10-CM | POA: Diagnosis not present

## 2023-01-31 DIAGNOSIS — D509 Iron deficiency anemia, unspecified: Secondary | ICD-10-CM | POA: Diagnosis not present

## 2023-01-31 DIAGNOSIS — N2581 Secondary hyperparathyroidism of renal origin: Secondary | ICD-10-CM | POA: Diagnosis not present

## 2023-01-31 DIAGNOSIS — E1129 Type 2 diabetes mellitus with other diabetic kidney complication: Secondary | ICD-10-CM | POA: Diagnosis not present

## 2023-01-31 DIAGNOSIS — Z992 Dependence on renal dialysis: Secondary | ICD-10-CM | POA: Diagnosis not present

## 2023-02-03 DIAGNOSIS — D688 Other specified coagulation defects: Secondary | ICD-10-CM | POA: Diagnosis not present

## 2023-02-03 DIAGNOSIS — Z992 Dependence on renal dialysis: Secondary | ICD-10-CM | POA: Diagnosis not present

## 2023-02-03 DIAGNOSIS — N2581 Secondary hyperparathyroidism of renal origin: Secondary | ICD-10-CM | POA: Diagnosis not present

## 2023-02-03 DIAGNOSIS — N186 End stage renal disease: Secondary | ICD-10-CM | POA: Diagnosis not present

## 2023-02-03 DIAGNOSIS — E1129 Type 2 diabetes mellitus with other diabetic kidney complication: Secondary | ICD-10-CM | POA: Diagnosis not present

## 2023-02-03 DIAGNOSIS — D509 Iron deficiency anemia, unspecified: Secondary | ICD-10-CM | POA: Diagnosis not present

## 2023-02-04 ENCOUNTER — Encounter: Payer: Self-pay | Admitting: Pharmacist

## 2023-02-04 ENCOUNTER — Ambulatory Visit: Payer: Medicare Other | Attending: Family Medicine | Admitting: Pharmacist

## 2023-02-04 ENCOUNTER — Other Ambulatory Visit: Payer: Self-pay

## 2023-02-04 DIAGNOSIS — E1151 Type 2 diabetes mellitus with diabetic peripheral angiopathy without gangrene: Secondary | ICD-10-CM | POA: Diagnosis not present

## 2023-02-04 DIAGNOSIS — Z794 Long term (current) use of insulin: Secondary | ICD-10-CM | POA: Diagnosis not present

## 2023-02-04 NOTE — Progress Notes (Signed)
S:     No chief complaint on file.  57 y.o. male who presents for diabetes evaluation, education, and management.  PMH is significant for T2DM w/ ESRD (MWF HD), anemia of chronic disease, resistant HTN, OSA, HLD, obesity, epilepsy. Patient was originally referred by Zelda on 08/20/2022. I've been involved in his care since 06/2022 and last saw him in 12/05/2022. At that visit, his A1c was 7.4, down from 11.9%. Regarding his HTN, his BP fluctuates and he is adamant that his BP is soft during dialysis and high on non-dialysis. For this reason, it is difficult to adjust his antihypertensives.   Today, patient arrives in good spirits and presents without any assistance. He has not placed his Libre device but endorses compliance with his insulin regimen. He is checking via finger stick and prefers not to use libre at this time. He has no complaints today regarding DM.   For his BP, Mr. Reckner tells me today that his blood pressure has been dropping at dialysis and he has stopped taking all of his blood pressure medications consistently. On non-dialysis days, he does not check his blood pressure at home but will sometimes take the enalapril.   Family/Social History:  -Fhx: DM, heart disease, seizure disorder, stroke -Tobacco: never smoker  -Alcohol: none reported   Current diabetes medications include: Lantus 50 units daily (sometimes will adjust if hypo to 45u), Humalog 5 or 6 units TID Current hypertension medications include: enalapril 5 mg (only taking on non-dialysis days) - this is limited by dizziness and lightheadedness.   Insurance coverage: BCBS  Reported home fasting blood sugars: 124, 141, 143, 130, 152, 130 since 01/17/2023. He does not check daily. Of note, 3 outliers of 256, 227, and 304. 14 day avg is 180.  Patient denies nocturia (nighttime urination).  Patient reports neuropathy (nerve pain). Patient denies visual changes. Patient reports self foot exams.   Patient  reported no changes from previous dietary habits:  -Non-adherent to sodium restriction. "Still in that salt game" -Drinks regular Pepsi and Mt. Dew daily.  -Does not limit carbohydrates but tries to avoid sweets. -Meals: does not eat at regular intervals.   Patient-reported exercise habits: walking and yard work  O:   7 day average blood glucose: no meter with him. No CGM in place.  Lab Results  Component Value Date   HGBA1C 7.4 (A) 12/05/2022   There were no vitals filed for this visit.  Lipid Panel     Component Value Date/Time   CHOL 261 (H) 02/04/2022 1129   TRIG 222 (H) 02/04/2022 1129   HDL 54 02/04/2022 1129   CHOLHDL 4.8 02/04/2022 1129   CHOLHDL 3.1 02/09/2016 1045   VLDL 11 02/09/2016 1045   LDLCALC 166 (H) 02/04/2022 1129   Clinical Atherosclerotic Cardiovascular Disease (ASCVD): No  The 10-year ASCVD risk score (Arnett DK, et al., 2019) is: 26.7%   Values used to calculate the score:     Age: 32 years     Sex: Male     Is Non-Hispanic African American: Yes     Diabetic: Yes     Tobacco smoker: No     Systolic Blood Pressure: 145 mmHg     Is BP treated: Yes     HDL Cholesterol: 54 mg/dL     Total Cholesterol: 261 mg/dL   A/P: Diabetes longstanding currently close to goal. Home sugars are still elevated to a small degree but we do not have a lot of data. Of  note, he does have a hx of ESRD and his A1c may not be accurate. His H/H levels were normal on last check in March even though he has a hx of anemia of chronic disease. We may need to consider getting a fructosamine on him. Patient is able to verbalize appropriate hypoglycemia management plan. Medication adherence appears to be fair.  -Continue Lantus 50 units daily.  -Continue Humalog 6u TID before meals.  -Patient educated on purpose, proper use, and potential adverse effects of Lantus, Humalog.  -Extensively discussed pathophysiology of diabetes, recommended lifestyle interventions, dietary effects on  blood sugar control.  -Counseled on s/sx of and management of hypoglycemia.  -Next A1c anticipated 02/2023. Consider evaluating fructosamine at next visit for better reflection of DM control.   Hypertension longstanding currently above goal. Blood pressure goal of <130/80 mmHg. Patient not taking enalapril on non-HD days.  Written patient instructions provided. Patient verbalized understanding of treatment plan.  Total time in face to face counseling 45 minutes.    Follow-up:  Pharmacist in 1 month. PCP clinic visit in September.  Butch Penny, PharmD, [name], CPP Clinical Pharmacist [facility] [phone]

## 2023-02-07 DIAGNOSIS — E1129 Type 2 diabetes mellitus with other diabetic kidney complication: Secondary | ICD-10-CM | POA: Diagnosis not present

## 2023-02-07 DIAGNOSIS — Z992 Dependence on renal dialysis: Secondary | ICD-10-CM | POA: Diagnosis not present

## 2023-02-07 DIAGNOSIS — N2581 Secondary hyperparathyroidism of renal origin: Secondary | ICD-10-CM | POA: Diagnosis not present

## 2023-02-07 DIAGNOSIS — D509 Iron deficiency anemia, unspecified: Secondary | ICD-10-CM | POA: Diagnosis not present

## 2023-02-07 DIAGNOSIS — D688 Other specified coagulation defects: Secondary | ICD-10-CM | POA: Diagnosis not present

## 2023-02-07 DIAGNOSIS — N186 End stage renal disease: Secondary | ICD-10-CM | POA: Diagnosis not present

## 2023-02-10 DIAGNOSIS — D688 Other specified coagulation defects: Secondary | ICD-10-CM | POA: Diagnosis not present

## 2023-02-10 DIAGNOSIS — Z992 Dependence on renal dialysis: Secondary | ICD-10-CM | POA: Diagnosis not present

## 2023-02-10 DIAGNOSIS — D509 Iron deficiency anemia, unspecified: Secondary | ICD-10-CM | POA: Diagnosis not present

## 2023-02-10 DIAGNOSIS — E1129 Type 2 diabetes mellitus with other diabetic kidney complication: Secondary | ICD-10-CM | POA: Diagnosis not present

## 2023-02-10 DIAGNOSIS — N186 End stage renal disease: Secondary | ICD-10-CM | POA: Diagnosis not present

## 2023-02-10 DIAGNOSIS — N2581 Secondary hyperparathyroidism of renal origin: Secondary | ICD-10-CM | POA: Diagnosis not present

## 2023-02-12 DIAGNOSIS — N186 End stage renal disease: Secondary | ICD-10-CM | POA: Diagnosis not present

## 2023-02-12 DIAGNOSIS — D509 Iron deficiency anemia, unspecified: Secondary | ICD-10-CM | POA: Diagnosis not present

## 2023-02-12 DIAGNOSIS — N2581 Secondary hyperparathyroidism of renal origin: Secondary | ICD-10-CM | POA: Diagnosis not present

## 2023-02-12 DIAGNOSIS — Z992 Dependence on renal dialysis: Secondary | ICD-10-CM | POA: Diagnosis not present

## 2023-02-12 DIAGNOSIS — E1129 Type 2 diabetes mellitus with other diabetic kidney complication: Secondary | ICD-10-CM | POA: Diagnosis not present

## 2023-02-12 DIAGNOSIS — D688 Other specified coagulation defects: Secondary | ICD-10-CM | POA: Diagnosis not present

## 2023-02-13 DIAGNOSIS — N186 End stage renal disease: Secondary | ICD-10-CM | POA: Diagnosis not present

## 2023-02-13 DIAGNOSIS — Z992 Dependence on renal dialysis: Secondary | ICD-10-CM | POA: Diagnosis not present

## 2023-02-13 DIAGNOSIS — Z452 Encounter for adjustment and management of vascular access device: Secondary | ICD-10-CM | POA: Diagnosis not present

## 2023-02-14 DIAGNOSIS — N186 End stage renal disease: Secondary | ICD-10-CM | POA: Diagnosis not present

## 2023-02-14 DIAGNOSIS — N2581 Secondary hyperparathyroidism of renal origin: Secondary | ICD-10-CM | POA: Diagnosis not present

## 2023-02-14 DIAGNOSIS — D688 Other specified coagulation defects: Secondary | ICD-10-CM | POA: Diagnosis not present

## 2023-02-14 DIAGNOSIS — E1129 Type 2 diabetes mellitus with other diabetic kidney complication: Secondary | ICD-10-CM | POA: Diagnosis not present

## 2023-02-14 DIAGNOSIS — Z992 Dependence on renal dialysis: Secondary | ICD-10-CM | POA: Diagnosis not present

## 2023-02-14 DIAGNOSIS — D509 Iron deficiency anemia, unspecified: Secondary | ICD-10-CM | POA: Diagnosis not present

## 2023-02-17 DIAGNOSIS — D688 Other specified coagulation defects: Secondary | ICD-10-CM | POA: Diagnosis not present

## 2023-02-17 DIAGNOSIS — D509 Iron deficiency anemia, unspecified: Secondary | ICD-10-CM | POA: Diagnosis not present

## 2023-02-17 DIAGNOSIS — Z992 Dependence on renal dialysis: Secondary | ICD-10-CM | POA: Diagnosis not present

## 2023-02-17 DIAGNOSIS — E1129 Type 2 diabetes mellitus with other diabetic kidney complication: Secondary | ICD-10-CM | POA: Diagnosis not present

## 2023-02-17 DIAGNOSIS — N2581 Secondary hyperparathyroidism of renal origin: Secondary | ICD-10-CM | POA: Diagnosis not present

## 2023-02-17 DIAGNOSIS — N186 End stage renal disease: Secondary | ICD-10-CM | POA: Diagnosis not present

## 2023-02-19 DIAGNOSIS — N186 End stage renal disease: Secondary | ICD-10-CM | POA: Diagnosis not present

## 2023-02-19 DIAGNOSIS — I129 Hypertensive chronic kidney disease with stage 1 through stage 4 chronic kidney disease, or unspecified chronic kidney disease: Secondary | ICD-10-CM | POA: Diagnosis not present

## 2023-02-19 DIAGNOSIS — Z992 Dependence on renal dialysis: Secondary | ICD-10-CM | POA: Diagnosis not present

## 2023-02-21 DIAGNOSIS — N2581 Secondary hyperparathyroidism of renal origin: Secondary | ICD-10-CM | POA: Diagnosis not present

## 2023-02-21 DIAGNOSIS — E1129 Type 2 diabetes mellitus with other diabetic kidney complication: Secondary | ICD-10-CM | POA: Diagnosis not present

## 2023-02-21 DIAGNOSIS — R739 Hyperglycemia, unspecified: Secondary | ICD-10-CM | POA: Diagnosis not present

## 2023-02-21 DIAGNOSIS — Z992 Dependence on renal dialysis: Secondary | ICD-10-CM | POA: Diagnosis not present

## 2023-02-21 DIAGNOSIS — D509 Iron deficiency anemia, unspecified: Secondary | ICD-10-CM | POA: Diagnosis not present

## 2023-02-21 DIAGNOSIS — N186 End stage renal disease: Secondary | ICD-10-CM | POA: Diagnosis not present

## 2023-02-24 DIAGNOSIS — R739 Hyperglycemia, unspecified: Secondary | ICD-10-CM | POA: Diagnosis not present

## 2023-02-24 DIAGNOSIS — N2581 Secondary hyperparathyroidism of renal origin: Secondary | ICD-10-CM | POA: Diagnosis not present

## 2023-02-24 DIAGNOSIS — E1129 Type 2 diabetes mellitus with other diabetic kidney complication: Secondary | ICD-10-CM | POA: Diagnosis not present

## 2023-02-24 DIAGNOSIS — Z992 Dependence on renal dialysis: Secondary | ICD-10-CM | POA: Diagnosis not present

## 2023-02-24 DIAGNOSIS — D509 Iron deficiency anemia, unspecified: Secondary | ICD-10-CM | POA: Diagnosis not present

## 2023-02-24 DIAGNOSIS — N186 End stage renal disease: Secondary | ICD-10-CM | POA: Diagnosis not present

## 2023-02-26 DIAGNOSIS — N2581 Secondary hyperparathyroidism of renal origin: Secondary | ICD-10-CM | POA: Diagnosis not present

## 2023-02-26 DIAGNOSIS — R739 Hyperglycemia, unspecified: Secondary | ICD-10-CM | POA: Diagnosis not present

## 2023-02-26 DIAGNOSIS — D509 Iron deficiency anemia, unspecified: Secondary | ICD-10-CM | POA: Diagnosis not present

## 2023-02-26 DIAGNOSIS — Z992 Dependence on renal dialysis: Secondary | ICD-10-CM | POA: Diagnosis not present

## 2023-02-26 DIAGNOSIS — N186 End stage renal disease: Secondary | ICD-10-CM | POA: Diagnosis not present

## 2023-02-26 DIAGNOSIS — E1129 Type 2 diabetes mellitus with other diabetic kidney complication: Secondary | ICD-10-CM | POA: Diagnosis not present

## 2023-02-27 ENCOUNTER — Telehealth: Payer: Self-pay | Admitting: Physician Assistant

## 2023-02-27 ENCOUNTER — Telehealth: Payer: Self-pay | Admitting: Adult Health

## 2023-02-27 ENCOUNTER — Ambulatory Visit (INDEPENDENT_AMBULATORY_CARE_PROVIDER_SITE_OTHER): Payer: Medicare Other | Admitting: Physical Medicine and Rehabilitation

## 2023-02-27 ENCOUNTER — Encounter: Payer: Self-pay | Admitting: Physical Medicine and Rehabilitation

## 2023-02-27 DIAGNOSIS — D1779 Benign lipomatous neoplasm of other sites: Secondary | ICD-10-CM | POA: Diagnosis not present

## 2023-02-27 DIAGNOSIS — M5441 Lumbago with sciatica, right side: Secondary | ICD-10-CM

## 2023-02-27 DIAGNOSIS — M5442 Lumbago with sciatica, left side: Secondary | ICD-10-CM | POA: Diagnosis not present

## 2023-02-27 DIAGNOSIS — M47816 Spondylosis without myelopathy or radiculopathy, lumbar region: Secondary | ICD-10-CM

## 2023-02-27 DIAGNOSIS — M5416 Radiculopathy, lumbar region: Secondary | ICD-10-CM

## 2023-02-27 DIAGNOSIS — M48062 Spinal stenosis, lumbar region with neurogenic claudication: Secondary | ICD-10-CM | POA: Diagnosis not present

## 2023-02-27 DIAGNOSIS — E882 Lipomatosis, not elsewhere classified: Secondary | ICD-10-CM

## 2023-02-27 DIAGNOSIS — G8929 Other chronic pain: Secondary | ICD-10-CM

## 2023-02-27 MED ORDER — HYDROCODONE-ACETAMINOPHEN 5-325 MG PO TABS: 1.0000 | ORAL_TABLET | Freq: Three times a day (TID) | ORAL | 0 refills | Status: DC | PRN

## 2023-02-27 NOTE — Telephone Encounter (Signed)
I reviewed his chart. We have only seen him for seizures and have not ordered injections. Can you ask him to call the provider who ordered the injections and have them follow-up?

## 2023-02-27 NOTE — Telephone Encounter (Signed)
Patient called and said the prescription you gave him his pharmacy do not have it and that it needs to go to another one. CB#[phone].

## 2023-02-27 NOTE — Progress Notes (Signed)
Functional Pain Scale - descriptive words and definitions  Distracting (5)    Aware of pain/able to complete some ADL's but limited by pain/sleep is affected and active distractions are only slightly useful. Moderate range order  Average Pain varies  Lower back pain with radiation in both legs to the hips and buttocks. Last injection at [facility] on 01/30/23 did not help

## 2023-02-27 NOTE — Progress Notes (Unsigned)
Jeremy [name]. - 57 y.o. male MRN [id number]  Date of birth: July 19, 1966  Office Visit Note: Visit Date: 02/27/2023 PCP: Claiborne Rigg, NP Referred by: Claiborne Rigg, NP  Subjective: Chief Complaint  Patient presents with  . Lower Back - Pain   HPI: Jeremy [name]. is a 57 y.o. male who comes in today for evaluation of chronic, worsening and severe bilateral lower back pain radiating to lateral hips, buttocks and down legs, left greater than right. Pain ongoing for several years, worsens with prolonged standing and walking. He describes pain as sore, aching and numbness sensation, currently rats as 8 out of 10. He reports intermittent numbness to legs. Sitting seems to alleviate his pain significantly. Lumbar MRI imaging from 2023 exhibits bilateral facet arthropathy, prominent epidural fat and moderate to severe spinal canal stenosis L3-L4 and L4-L5. He underwent right L4-L5 interlaminar epidural steroid injection in our office on 03/21/2023, he reports greater than 50% relief of pain for over 6 months with this procedure. More recently underwent left L4-L5 interlaminar epidural steroid injection with [facility] on 01/30/2023, he reports no relief of pain with this procedure. Patient recently started cutting grass, reports pain does not allow him to be functional. Patient denies focal weakness. No recent trauma or falls.  Patients course is complicated by diabetes mellitus, chronic kidney disease, OSA, and morbid obesity. He attends hemodialysis Monday, Wednesday and Friday.   Review of Systems  Musculoskeletal:  Positive for back pain.  Neurological:  Positive for tingling. Negative for focal weakness and weakness.  All other systems reviewed and are negative.  Otherwise per HPI.  Assessment & Plan: Visit Diagnoses:    ICD-10-CM   1. Chronic bilateral low back pain with bilateral sciatica  M54.42 Ambulatory referral to Physical Medicine Rehab   M54.41    G89.29     2. Lumbar  radiculopathy  M54.16 Ambulatory referral to Physical Medicine Rehab    3. Spinal stenosis of lumbar region with neurogenic claudication  M48.062 Ambulatory referral to Physical Medicine Rehab    4. Epidural lipomatosis  D17.79 Ambulatory referral to Physical Medicine Rehab    5. Facet arthropathy, lumbar  M47.816 Ambulatory referral to Physical Medicine Rehab    6. Morbid (severe) obesity due to excess calories (HCC)  E66.01        Plan: Findings:  Chronic, worsening and severe bilateral lower back pain radiating to lateral hips, buttocks and down legs. Patients continues to have severe pain despite good conservative therapies such as home exercise regimen, rest and use of medications. Patients clinical presentation and exam are consistent with neurogenic claudication as a result of spinal canal stenosis. There is moderate to severe spinal canal stenosis at L3-L4 and L4-L5. Next step to perform diagnostic and hopefully therapeutic left L4-L5 interlaminar epidural steroid injection under fluoroscopic guidance. If good relief of pain with injection we can repeat this procedure infrequently as needed. If his pain persists we would consider transforaminal approach, would also recommend referral to our spine surgeon Dr. Willia Craze. I discussed medication management with him today, I did prescribe short course of Norco until we are able to get him in for injection. I explained that we do not manage chronic pain with long term opioids in our office. If he wishes to stay on Norco long term best option would be referral to chronic pain management. No red flag symptoms noted upon exam today.     Meds & Orders:  Meds ordered this encounter  Medications  . HYDROcodone-acetaminophen (NORCO/VICODIN) 5-325 MG tablet    Sig: Take 1 tablet by mouth every 8 (eight) hours as needed for moderate pain or severe pain.    Dispense:  20 tablet    Refill:  0    Orders Placed This Encounter  Procedures  .  Ambulatory referral to Physical Medicine Rehab    Follow-up: Return for Left L4-L5 interlaminar epidural steroid injection.   Procedures: No procedures performed      Clinical History: EXAM: MRI LUMBAR SPINE WITHOUT CONTRAST   TECHNIQUE: Multiplanar, multisequence MR imaging of the lumbar spine was performed. No intravenous contrast was administered.   COMPARISON:  Lumbar radiographs January 30, 2022.   FINDINGS: Segmentation: Standard segmentation is assumed. The inferior-most fully formed intervertebral disc labeled L5-S1.   Alignment:  No substantial sagittal subluxation.   Vertebrae: Vertebral body heights are maintained. No focal marrow edema chest acute fracture discitis/osteomyelitis. No suspicious bone lesions.   Conus medullaris and cauda equina: Conus extends to the L1-L2 level. Conus appears normal.   Paraspinal and other soft tissues: Unremarkable.   Disc levels:   T12-L1: No significant disc protrusion, foraminal stenosis, or canal stenosis.   L1-L2: No significant disc protrusion, foraminal stenosis, or canal stenosis.   L2-L3: Mild disc bulging. No significant canal or foraminal stenosis.   L3-L4: Disc bulging, bilateral facet arthropathy, and mild prominence of the epidural fat. Resulting moderate to severe canal stenosis. Mild right greater than left foraminal stenosis.   L4-L5: Disc bulging with bilateral facet arthropathy. Mild superimposed prominence of the epidural fat. Resulting moderate to severe canal stenosis. Mild-to-moderate bilateral foraminal stenosis.   L5-S1: Mild bilateral facet arthropathy. Disc bulging with small annular fissure. Prominence of the epidural fat. Moderate canal stenosis. Patent foramina.   IMPRESSION: 1. Degenerative change and prominent epidural fat results in moderate to severe canal stenosis at L3-L4 L4-L5 and moderate canal stenosis at L5-S1. 2. Mild to moderate bilateral foraminal stenosis at L4-L5 and  mild bilateral foraminal stenosis at L3-L4.     Electronically Signed   By: Feliberto Harts M.D.   On: 02/18/2022 09:09   He reports that he has never smoked. He has never been exposed to tobacco smoke. He has never used smokeless tobacco.  Recent Labs    05/16/22 1012 08/20/22 0852 12/05/22 0849  HGBA1C 13.4* 11.9* 7.4*    Objective:  VS:  HT:    WT:   BMI:     BP:   HR: bpm  TEMP: ( )  RESP:  Physical Exam Vitals and nursing note reviewed.  HENT:     Head: Normocephalic and atraumatic.     Right Ear: External ear normal.     Left Ear: External ear normal.     Nose: Nose normal.     Mouth/Throat:     Mouth: Mucous membranes are moist.  Eyes:     Extraocular Movements: Extraocular movements intact.  Cardiovascular:     Rate and Rhythm: Normal rate.     Pulses: Normal pulses.  Pulmonary:     Effort: Pulmonary effort is normal.  Abdominal:     General: Abdomen is flat. There is no distension.  Musculoskeletal:        General: Tenderness present.     Cervical back: Normal range of motion.     Comments: Patient rises from seated position to standing without difficulty. Good lumbar range of motion. No pain noted with facet loading. 5/5 strength noted with bilateral hip flexion, knee  flexion/extension, ankle dorsiflexion/plantarflexion and EHL. No clonus noted bilaterally. No pain upon palpation of greater trochanters. No pain with internal/external rotation of bilateral hips. Sensation intact bilaterally. Negative slump test bilaterally. Ambulates without aid, gait steady.     Skin:    General: Skin is warm and dry.     Capillary Refill: Capillary refill takes less than 2 seconds.  Neurological:     General: No focal deficit present.     Mental Status: He is alert and oriented to person, place, and time.  Psychiatric:        Mood and Affect: Mood normal.        Behavior: Behavior normal.    Ortho Exam  Imaging: No results found.  Past  Medical/Family/Surgical/Social History: Medications & Allergies reviewed per EMR, new medications updated. Patient Active Problem List   Diagnosis Date Noted  . Pain, unspecified 12/31/2021  . Encounter for removal of sutures 10/05/2021  . ESRD on dialysis (HCC) 11/09/2019  . Allergy, unspecified, initial encounter 08/16/2019  . Anaphylactic shock, unspecified, initial encounter 08/16/2019  . Anemia in chronic kidney disease 08/16/2019  . Complication of vascular dialysis catheter 08/16/2019  . Fever, unspecified 08/16/2019  . Iron deficiency anemia, unspecified 08/16/2019  . Other specified coagulation defects (HCC) 08/16/2019  . Pruritus, unspecified 08/16/2019  . Shortness of breath 08/16/2019  . Type 2 diabetes mellitus with diabetic peripheral angiopathy without gangrene (HCC) 08/16/2019  . COVID-19 virus infection 08/08/2019  . Acute right ankle pain 07/27/2019  . Tinnitus 05/26/2019  . OSA (obstructive sleep apnea) 10/07/2018  . Resistant hypertension 09/23/2018  . Secondary hyperparathyroidism (HCC) 04/06/2018  . Benign neoplasm of pituitary gland (HCC) 08/10/2017  . Seizure (HCC)   . Bilateral lower extremity edema 04/08/2017  . Uncontrolled type 2 diabetes mellitus with diabetic nephropathy, with long-term current use of insulin 03/03/2017  . Chronic kidney disease (CKD), stage IV (severe) (HCC) 02/14/2014  . Morbid obesity (HCC) 05/20/2013  . Erectile dysfunction associated with type 2 diabetes mellitus (HCC) 06/02/2012  . Macular edema 12/12/2011  . Mixed hyperlipidemia due to type 2 diabetes mellitus (HCC) 11/11/2011   Past Medical History:  Diagnosis Date  . Abscess   . AKI (acute kidney injury) (HCC) 08/05/2017  . Bell's palsy    right eye abn since dx bell's palsy  . CKD (chronic kidney disease)    Dr. McDiarmind   . COVID 2020   mild  . Dyspnea    occasional with exertion  . ESRD (end stage renal disease) (HCC)    MWF -[facility]  . Headache     migraines  . High cholesterol   . Hypertension   . Left shoulder pain 10/26/2013   S/p injection on 10/26/13   . Renal insufficiency 02/14/2014  . Seizures (HCC) 08/04/2017   "related to high blood sugars" (08/05/2017)  . Type II diabetes mellitus (HCC)    Family History  Problem Relation Age of Onset  . Diabetes Mother   . Heart disease Mother 66  . Diabetes Sister   . Diabetes Brother   . Diabetes Brother   . Cancer Maternal Uncle        type unknown  . Seizures Niece   . Stroke Neg Hx    Past Surgical History:  Procedure Laterality Date  . A/V FISTULAGRAM Left 07/25/2022   Procedure: A/V Fistulagram;  Surgeon: Cephus Shelling, MD;  Location: [facility] INVASIVE CV LAB;  Service: Cardiovascular;  Laterality: Left;  . AV FISTULA PLACEMENT Left 11/11/2019  Procedure: LEFT FOREARM RADIOCEPHALIC ARTERIOVENOUS (AV) FISTULA CREATION;  Surgeon: Cephus Shelling, MD;  Location: [facility] OR;  Service: Vascular;  Laterality: Left;  . AV FISTULA PLACEMENT Left 08/14/2022   Procedure: FIRST STAGE BRACHIOBASILIC LEFT ARM ARTERIOVENOUS (AV) FISTULA CREATION;  Surgeon: Cephus Shelling, MD;  Location: [facility] OR;  Service: Vascular;  Laterality: Left;  . BASCILIC VEIN TRANSPOSITION Left 10/30/2022   Procedure: LEFT ARM SECOND STAGE BASILIC VEIN TRANSPOSITION;  Surgeon: Cephus Shelling, MD;  Location: [facility] OR;  Service: Vascular;  Laterality: Left;  . Eyelid surgery Right   . I & D EXTREMITY  11/11/2011   Procedure: IRRIGATION AND DEBRIDEMENT EXTREMITY;  Surgeon: Robyne Askew, MD;  Location: [facility] OR;  Service: General;  Laterality: Right;  I&D Right Thigh Abscess  . INSERTION OF DIALYSIS CATHETER N/A 07/25/2022   Procedure: INSERTION OF DIALYSIS CATHETER;  Surgeon: Cephus Shelling, MD;  Location: [facility] OR;  Service: Vascular;  Laterality: N/A;  . IR FLUORO GUIDE CV LINE RIGHT  08/12/2019  . IR US GUIDE VASC ACCESS RIGHT  08/12/2019  . LIGATION OF ARTERIOVENOUS  FISTULA Left 08/14/2022   Procedure:  LIGATION OF BRACHIOCEPHALIC ARTERIOVENOUS  FISTULA;  Surgeon: Cephus Shelling, MD;  Location: [facility] OR;  Service: Vascular;  Laterality: Left;  . REVISON OF ARTERIOVENOUS FISTULA Left 03/23/2020   Procedure: LEFT ARM ARTERIOVENOUS FISTULA REVISON, LIGATION OF SIDE BRANCHES AND ELEVATION;  Surgeon: Cephus Shelling, MD;  Location: [facility] OR;  Service: Vascular;  Laterality: Left;   Social History   Occupational History  . Occupation: Estate manager/land agent  . Occupation: Disablity  Tobacco Use  . Smoking status: Never    Passive exposure: Never  . Smokeless tobacco: Never  Vaping Use  . Vaping status: Never Used  Substance and Sexual Activity  . Alcohol use: Not Currently  . Drug use: No  . Sexual activity: Yes

## 2023-02-27 NOTE — Telephone Encounter (Signed)
Pt called stating that the inj he received in his back recently is not working for him and now he is feeling a burning stabbing pain in hip. Pt is needing to discuss with RN.

## 2023-02-28 ENCOUNTER — Other Ambulatory Visit: Payer: Self-pay | Admitting: Physical Medicine and Rehabilitation

## 2023-02-28 DIAGNOSIS — D509 Iron deficiency anemia, unspecified: Secondary | ICD-10-CM | POA: Diagnosis not present

## 2023-02-28 DIAGNOSIS — G8929 Other chronic pain: Secondary | ICD-10-CM

## 2023-02-28 DIAGNOSIS — E1129 Type 2 diabetes mellitus with other diabetic kidney complication: Secondary | ICD-10-CM | POA: Diagnosis not present

## 2023-02-28 DIAGNOSIS — N2581 Secondary hyperparathyroidism of renal origin: Secondary | ICD-10-CM | POA: Diagnosis not present

## 2023-02-28 DIAGNOSIS — M47816 Spondylosis without myelopathy or radiculopathy, lumbar region: Secondary | ICD-10-CM

## 2023-02-28 DIAGNOSIS — N186 End stage renal disease: Secondary | ICD-10-CM | POA: Diagnosis not present

## 2023-02-28 DIAGNOSIS — Z992 Dependence on renal dialysis: Secondary | ICD-10-CM | POA: Diagnosis not present

## 2023-02-28 DIAGNOSIS — R739 Hyperglycemia, unspecified: Secondary | ICD-10-CM | POA: Diagnosis not present

## 2023-02-28 DIAGNOSIS — M48062 Spinal stenosis, lumbar region with neurogenic claudication: Secondary | ICD-10-CM

## 2023-02-28 MED ORDER — TRAMADOL HCL 50 MG PO TABS: 50.0000 mg | ORAL_TABLET | Freq: Three times a day (TID) | ORAL | 0 refills | Status: DC | PRN

## 2023-02-28 NOTE — Progress Notes (Signed)
Patient called to let me know that [facility] is not able to get Norco. I called in prescription for Tramadol today.

## 2023-03-03 DIAGNOSIS — R739 Hyperglycemia, unspecified: Secondary | ICD-10-CM | POA: Diagnosis not present

## 2023-03-03 DIAGNOSIS — N186 End stage renal disease: Secondary | ICD-10-CM | POA: Diagnosis not present

## 2023-03-03 DIAGNOSIS — E1129 Type 2 diabetes mellitus with other diabetic kidney complication: Secondary | ICD-10-CM | POA: Diagnosis not present

## 2023-03-03 DIAGNOSIS — Z992 Dependence on renal dialysis: Secondary | ICD-10-CM | POA: Diagnosis not present

## 2023-03-03 DIAGNOSIS — D509 Iron deficiency anemia, unspecified: Secondary | ICD-10-CM | POA: Diagnosis not present

## 2023-03-03 DIAGNOSIS — N2581 Secondary hyperparathyroidism of renal origin: Secondary | ICD-10-CM | POA: Diagnosis not present

## 2023-03-03 NOTE — Progress Notes (Unsigned)
PATIENT: Jeremy [name]. DOB: Mar 21, 1966  REASON FOR VISIT: follow up HISTORY FROM: patient Primary neurologist: Dr. Epimenio Foot  No chief complaint on file.    HISTORY OF PRESENT ILLNESS: Today 03/03/23:  Jeremy [name] is a 57 year old male with a history of seizures.  He returns today for follow-up.  He remains on Keppra extended release of 1000 mg daily.  Denies any seizure events.  Overall he is doing well.  Continues on dialysis.  Continues to operate a motor vehicle without difficulty.  Returns today for an evaluation.  02/07/2021: Jeremy [name] is a 57 year old male with a history of seizures.  He returns today for follow-up.  He is on Keppra extended release 1000 mg daily.  He denies any seizure events.  He continues to operate a motor vehicle without difficulty.  Able to complete all ADLs independently.  Patient reports that he is on disability due to dialysis.  So far he has not had an issue with seizures after dialysis.  He returns today for an evaluation.    02/07/20: Jeremy [name] is a 57 year old male with a history of seizures.  He returns today for follow-up.  He is on Keppra extended release.  His prescription was written for 750 mg 2 tablets daily.  However he states he has been breaking the tablets in half and taking a half a tablet in the morning and a half a tablet in the evening.  He states that the full dose makes him dizzy.  He denies any seizure events.  He operates a Librarian, academic without difficulty.  He returns today for an evaluation.  HISTORY 02/04/19:   Jeremy [name] is a 57 year old male with a history of seizures.  He returns today for follow-up.  He is currently on Keppra however he is unsure if he is on the extended release or the immediate release.  We will call his pharmacy to verify.  He reports he has not had any seizures.  He operates a Librarian, academic without difficulty.  Denies any changes in his gait or balance.  No change in his mood or behavior.  He returns today  for follow-up.  REVIEW OF SYSTEMS: Out of a complete 14 system review of symptoms, the patient complains only of the following symptoms, and all other reviewed systems are negative.  See HPI  ALLERGIES: Allergies  Allergen Reactions   Amlodipine Other (See Comments)    Dizziness    HOME MEDICATIONS: Outpatient Medications Prior to Visit  Medication Sig Dispense Refill   atorvastatin (LIPITOR) 40 MG tablet Take 1 tablet (40 mg total) by mouth daily. 90 tablet 3   Blood Glucose Monitoring Suppl (CONTOUR NEXT EZ) w/Device KIT Use to check blood sugar three times daily. 1 kit 0   [facility] ASPIRIN LOW DOSE 81 MG tablet TAKE 1 TABLET BY MOUTH EVERY DAY 90 tablet 0   diclofenac Sodium (VOLTAREN) 1 % GEL Apply 4 g topically 4 (four) times daily. 200 g 1   Doxercalciferol (HECTOROL IV) Doxercalciferol (Hectorol)     enalapril (VASOTEC) 5 MG tablet Take 1 tablet (5 mg total) by mouth daily. 90 tablet 1   glucose blood (CONTOUR NEXT TEST) test strip Use to check blood sugar three times daily. 100 each 12   HYDROcodone-acetaminophen (NORCO/VICODIN) 5-325 MG tablet Take 1 tablet by mouth every 8 (eight) hours as needed for moderate pain or severe pain. 20 tablet 0   insulin glargine (LANTUS SOLOSTAR) 100 UNIT/ML Solostar Pen Inject 50 Units into the  skin daily. (Patient not taking: Reported on 01/01/2023) 45 mL 1   insulin lispro (HUMALOG KWIKPEN) 100 UNIT/ML KwikPen Inject 6 Units into the skin 3 (three) times daily with meals. 15 mL 11   Insulin Pen Needle (B-D ULTRAFINE III SHORT PEN) 31G X 8 MM MISC 1 application  by Does not apply route 3 (three) times daily. E11.65 200 each 11   Lancet Devices (MICROLET NEXT LANCING DEVICE) MISC 1 Device by Does not apply route 3 (three) times daily. 1 each 11   levETIRAcetam (KEPPRA XR) 500 MG 24 hr tablet Take 1 tablet (500 mg total) by mouth in the morning and at bedtime. 180 tablet 1   Microlet Lancets MISC Use to check blood sugar three times daily. E11.65 100  each 3   SENSIPAR 30 MG tablet Take 30 mg by mouth 3 (three) times a week.     traMADol (ULTRAM) 50 MG tablet Take 1 tablet (50 mg total) by mouth every 8 (eight) hours as needed. 20 tablet 0   VELPHORO 500 MG chewable tablet Chew 500 mg by mouth 3 (three) times daily with meals.     Facility-Administered Medications Prior to Visit  Medication Dose Route Frequency Provider Last Rate Last Admin   0.9 %  sodium chloride infusion  250 mL Intravenous PRN Cephus Shelling, MD       sodium chloride flush (NS) 0.9 % injection 3 mL  3 mL Intravenous Q12H Cephus Shelling, MD        PAST MEDICAL HISTORY: Past Medical History:  Diagnosis Date   Abscess    AKI (acute kidney injury) (HCC) 08/05/2017   Bell's palsy    right eye abn since dx bell's palsy   CKD (chronic kidney disease)    Dr. McDiarmind    COVID 2020   mild   Dyspnea    occasional with exertion   ESRD (end stage renal disease) (HCC)    MWF -[facility]   Headache    migraines   High cholesterol    Hypertension    Left shoulder pain 10/26/2013   S/p injection on 10/26/13    Renal insufficiency 02/14/2014   Seizures (HCC) 08/04/2017   "related to high blood sugars" (08/05/2017)   Type II diabetes mellitus (HCC)     PAST SURGICAL HISTORY: Past Surgical History:  Procedure Laterality Date   A/V FISTULAGRAM Left 07/25/2022   Procedure: A/V Fistulagram;  Surgeon: Cephus Shelling, MD;  Location: [facility];  Service: Cardiovascular;  Laterality: Left;   AV FISTULA PLACEMENT Left 11/11/2019   Procedure: LEFT FOREARM RADIOCEPHALIC ARTERIOVENOUS (AV) FISTULA CREATION;  Surgeon: Cephus Shelling, MD;  Location: [facility] OR;  Service: Vascular;  Laterality: Left;   AV FISTULA PLACEMENT Left 08/14/2022   Procedure: FIRST STAGE BRACHIOBASILIC LEFT ARM ARTERIOVENOUS (AV) FISTULA CREATION;  Surgeon: Cephus Shelling, MD;  Location: [facility] OR;  Service: Vascular;  Laterality: Left;   BASCILIC VEIN TRANSPOSITION Left  10/30/2022   Procedure: LEFT ARM SECOND STAGE BASILIC VEIN TRANSPOSITION;  Surgeon: Cephus Shelling, MD;  Location: [facility] OR;  Service: Vascular;  Laterality: Left;   Eyelid surgery Right    I & D EXTREMITY  11/11/2011   Procedure: IRRIGATION AND DEBRIDEMENT EXTREMITY;  Surgeon: Robyne Askew, MD;  Location: [facility] OR;  Service: General;  Laterality: Right;  I&D Right Thigh Abscess   INSERTION OF DIALYSIS CATHETER N/A 07/25/2022   Procedure: INSERTION OF DIALYSIS CATHETER;  Surgeon: Cephus Shelling, MD;  Location: [facility] OR;  Service: Vascular;  Laterality: N/A;   IR FLUORO GUIDE CV LINE RIGHT  08/12/2019   IR US GUIDE VASC ACCESS RIGHT  08/12/2019   LIGATION OF ARTERIOVENOUS  FISTULA Left 08/14/2022   Procedure: LIGATION OF BRACHIOCEPHALIC ARTERIOVENOUS  FISTULA;  Surgeon: Cephus Shelling, MD;  Location: [facility] OR;  Service: Vascular;  Laterality: Left;   REVISON OF ARTERIOVENOUS FISTULA Left 03/23/2020   Procedure: LEFT ARM ARTERIOVENOUS FISTULA REVISON, LIGATION OF SIDE BRANCHES AND ELEVATION;  Surgeon: Cephus Shelling, MD;  Location: [facility] OR;  Service: Vascular;  Laterality: Left;    FAMILY HISTORY: Family History  Problem Relation Age of Onset   Diabetes Mother    Heart disease Mother 74   Diabetes Sister    Diabetes Brother    Diabetes Brother    Cancer Maternal Uncle        type unknown   Seizures Niece    Stroke Neg Hx     SOCIAL HISTORY: Social History   Socioeconomic History   Marital status: Significant Other    Spouse name: Not on file   Number of children: 3   Years of education: Not on file   Highest education level: Not on file  Occupational History   Occupation: maintenance tech   Occupation: Disablity  Tobacco Use   Smoking status: Never    Passive exposure: Never   Smokeless tobacco: Never  Vaping Use   Vaping status: Never Used  Substance and Sexual Activity   Alcohol use: Not Currently   Drug use: No   Sexual activity: Yes  Other Topics Concern    Not on file  Social History Narrative   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  Did not graduate high school.  Has 3 adult children in [place].  Divorced.  Lives with girlfriend Emilio Math).                   Social Determinants of Health   Financial Resource Strain: Low Risk  (01/01/2023)   Overall Financial Resource Strain (CARDIA)    Difficulty of Paying Living Expenses: Not very hard  Recent Concern: Financial Resource Strain - Medium Risk (10/24/2022)   Overall Financial Resource Strain (CARDIA)    Difficulty of Paying Living Expenses: Somewhat hard  Food Insecurity: No Food Insecurity (01/01/2023)   Hunger Vital Sign    Worried About Running Out of Food in the Last Year: Never true    Ran Out of Food in the Last Year: Never true  Transportation Needs: No Transportation Needs (01/01/2023)   PRAPARE - Administrator, Civil Service (Medical): No    Lack of Transportation (Non-Medical): No  Physical Activity: Inactive (01/01/2023)   Exercise Vital Sign    Days of Exercise per Week: 0 days    Minutes of Exercise per Session: 0 min  Stress: Stress Concern Present (01/01/2023)   [employer] of Occupational Health - Occupational Stress Questionnaire    Feeling of Stress : To some extent  Social Connections: Moderately Integrated (01/01/2023)   Social Connection and Isolation Panel [NHANES]    Frequency of Communication with Friends and Family: Three times a week    Frequency of Social Gatherings with Friends and Family: Three times a week    Attends Religious Services: Never    Active Member of Clubs or Organizations: No    Attends Engineer, structural: More than 4 times per year    Marital Status: Living  with partner  Recent Concern: Social Connections - Socially Isolated (10/24/2022)   Social Connection and Isolation Panel [NHANES]    Frequency of Communication with Friends and Family: Three times a week    Frequency of  Social Gatherings with Friends and Family: Three times a week    Attends Religious Services: Never    Active Member of Clubs or Organizations: No    Attends Banker Meetings: Never    Marital Status: Divorced  Catering manager Violence: Not At Risk (01/01/2023)   Humiliation, Afraid, Rape, and Kick questionnaire    Fear of Current or Ex-Partner: No    Emotionally Abused: No    Physically Abused: No    Sexually Abused: No      PHYSICAL EXAM  There were no vitals filed for this visit.   There is no height or weight on file to calculate BMI.  Generalized: Well developed, in no acute distress   Neurological examination  Mentation: Alert oriented to time, place, history taking. Follows all commands speech and language fluent Cranial nerve II-XII: Pupils were equal round reactive to light. Extraocular movements were full, visual field were full on confrontational test.Head turning and shoulder shrug  were normal and symmetric. Motor: The motor testing reveals 5 over 5 strength of all 4 extremities. Good symmetric motor tone is noted throughout.  Sensory: Sensory testing is intact to soft touch on all 4 extremities. No evidence of extinction is noted.  Coordination: Cerebellar testing reveals good finger-nose-finger and heel-to-shin bilaterally.  Gait and station: Gait is normal.    DIAGNOSTIC DATA (LABS, IMAGING, TESTING) - I reviewed patient records, labs, notes, testing and imaging myself where available.  Lab Results  Component Value Date   WBC 6.9 02/04/2022   HGB 13.3 10/30/2022   HCT 39.0 10/30/2022   MCV 79 02/04/2022   PLT 227 02/04/2022      Component Value Date/Time   NA 140 10/30/2022 0841   NA 137 02/04/2022 1129   K 5.1 10/30/2022 0841   CL 105 10/30/2022 0841   CO2 28 02/04/2022 1129   GLUCOSE 116 (H) 10/30/2022 0841   BUN 86 (H) 10/30/2022 0841   BUN 24 02/04/2022 1129   CREATININE 11.20 (H) 10/30/2022 0841   CREATININE 2.77 (H) 05/31/2016  0844   CALCIUM 10.1 02/04/2022 1129   PROT 7.5 02/04/2022 1129   ALBUMIN 4.4 02/04/2022 1129   AST 15 02/04/2022 1129   ALT 15 02/04/2022 1129   ALKPHOS 81 02/04/2022 1129   BILITOT 0.3 02/04/2022 1129   GFRNONAA 13 (L) 09/07/2021 1016   GFRNONAA 26 (L) 05/31/2016 0844   GFRAA 15 (L) 08/18/2020 0925   GFRAA 30 (L) 05/31/2016 0844   Lab Results  Component Value Date   CHOL 261 (H) 02/04/2022   HDL 54 02/04/2022   LDLCALC 166 (H) 02/04/2022   TRIG 222 (H) 02/04/2022   CHOLHDL 4.8 02/04/2022   Lab Results  Component Value Date   HGBA1C 7.4 (A) 12/05/2022   Lab Results  Component Value Date   VITAMINB12 412 11/26/2013   Lab Results  Component Value Date   TSH 1.610 08/20/2022      ASSESSMENT AND PLAN 57 y.o. year old male  has a past medical history of Abscess, AKI (acute kidney injury) (HCC) (08/05/2017), Bell's palsy, CKD (chronic kidney disease), COVID (2020), Dyspnea, ESRD (end stage renal disease) (HCC), Headache, High cholesterol, Hypertension, Left shoulder pain (10/26/2013), Renal insufficiency (02/14/2014), Seizures (HCC) (08/04/2017), and Type II diabetes mellitus (  HCC). here with :  Seizures   Continue Keppra 500 mg extended release-2 tablets daily for total of 1000 mg daily Advised patient if he has any seizure events he should let us know Follow-up in 1 year or sooner if needed     Butch Penny, MSN, NP-C 03/03/2023, 11:32 AM [facility] [address], [address] [place], [place] [address] [phone]

## 2023-03-04 ENCOUNTER — Ambulatory Visit (INDEPENDENT_AMBULATORY_CARE_PROVIDER_SITE_OTHER): Payer: Medicare Other | Admitting: Adult Health

## 2023-03-04 ENCOUNTER — Other Ambulatory Visit: Payer: Self-pay

## 2023-03-04 ENCOUNTER — Encounter: Payer: Self-pay | Admitting: Adult Health

## 2023-03-04 DIAGNOSIS — Z87898 Personal history of other specified conditions: Secondary | ICD-10-CM

## 2023-03-04 MED ORDER — LEVETIRACETAM ER 500 MG PO TB24
500.0000 mg | ORAL_TABLET | Freq: Two times a day (BID) | ORAL | 3 refills | Status: DC
  Filled 2023-03-04: qty 180, 90d supply, fill #0

## 2023-03-04 NOTE — Patient Instructions (Signed)
Your Plan:  Continue Keppra XR 1000 mg daily If your symptoms worsen or you develop new symptoms please let us know.    Thank you for coming to see [place] at [facility]. I hope we have been able to provide you high quality care today.  You may receive a patient satisfaction survey over the next few weeks. We would appreciate your feedback and comments so that we may continue to improve ourselves and the health of our patients.

## 2023-03-05 ENCOUNTER — Encounter (HOSPITAL_COMMUNITY): Payer: Self-pay | Admitting: *Deleted

## 2023-03-05 ENCOUNTER — Ambulatory Visit (HOSPITAL_COMMUNITY)
Admission: EM | Admit: 2023-03-05 | Discharge: 2023-03-05 | Disposition: A | Discharge status: Home / Self Care | Payer: Medicare Other

## 2023-03-05 DIAGNOSIS — N186 End stage renal disease: Secondary | ICD-10-CM

## 2023-03-05 DIAGNOSIS — M5441 Lumbago with sciatica, right side: Secondary | ICD-10-CM

## 2023-03-05 DIAGNOSIS — M5442 Lumbago with sciatica, left side: Secondary | ICD-10-CM

## 2023-03-05 DIAGNOSIS — G8929 Other chronic pain: Secondary | ICD-10-CM | POA: Diagnosis not present

## 2023-03-05 MED ORDER — HYDROCODONE-ACETAMINOPHEN 5-325 MG PO TABS: 1.0000 | ORAL_TABLET | Freq: Three times a day (TID) | ORAL | 0 refills | Status: AC | PRN

## 2023-03-05 NOTE — ED Triage Notes (Addendum)
Pt states he has left hip pain which is chronic he sees his ortho MD tomorrow for a injection. He does use Biofreeze   He complains of stomach pain and headache on and off 2 times a week for awhile. He has been taking tylenol.    Pt left dialysis on Monday early without completing cycle and he didn't go this morning due to pain.

## 2023-03-05 NOTE — ED Provider Notes (Signed)
[facility]    CSN: [id number] Arrival date & time: 03/05/23  7829      History   Chief Complaint Chief Complaint  Patient presents with   Hip Pain   Abdominal Pain   Headache    HPI Jeremy [name]. is a 57 y.o. male.   Patient presents to clinic for back and leg pain that is chronic.   Sitting is fine, walking he has to stop due to burning pain that radiates down his left leg. Bilateral sciatica, left side is worse.   After he stands the pain hits his entire back and radiates down his leg.   Recently seen at orthopedics on 02/27/23 for chronic bilateral low back pain with bilateral sciatica, lumbar radiculopathy, spinal stenosis, epidural lipomatosis, lumbar facet arthroplasty and morbid obesity. He was given referral for Physical Medicine Rehab and recommended for spinal steroid injections. Was given a script for Norco, but the pharmacy was out so he was sent in Tramadol. He did not pick anything up, as the pharmacy did not have these medications in stock.   Reports he is in so much pain, pain is interfering with daily activities. Intermittent abdominal pains. He left dialysis early on Monday and did not go today. Unsure if eating is triggering abdominal pains. Had a plate of food Sunday at church and it 'did not agree' with him. Some nausea on Monday, no emesis. Felt like the machine at dialysis was causing his abdominal pain and sickness. Denies chest pain or shortness of breath.       The history is provided by the patient and medical records.    Past Medical History:  Diagnosis Date   Abscess    AKI (acute kidney injury) (HCC) 08/05/2017   Bell's palsy    right eye abn since dx bell's palsy   CKD (chronic kidney disease)    Dr. McDiarmind    COVID 2020   mild   Dyspnea    occasional with exertion   ESRD (end stage renal disease) (HCC)    MWF -[facility]   Headache    migraines   High cholesterol    Hypertension    Left shoulder pain  10/26/2013   S/p injection on 10/26/13    Renal insufficiency 02/14/2014   Seizures (HCC) 08/04/2017   "related to high blood sugars" (08/05/2017)   Type II diabetes mellitus (HCC)     Patient Active Problem List   Diagnosis Date Noted   Pain, unspecified 12/31/2021   Encounter for removal of sutures 10/05/2021   ESRD on dialysis (HCC) 11/09/2019   Allergy, unspecified, initial encounter 08/16/2019   Anaphylactic shock, unspecified, initial encounter 08/16/2019   Anemia in chronic kidney disease 08/16/2019   Complication of vascular dialysis catheter 08/16/2019   Fever, unspecified 08/16/2019   Iron deficiency anemia, unspecified 08/16/2019   Other specified coagulation defects (HCC) 08/16/2019   Pruritus, unspecified 08/16/2019   Shortness of breath 08/16/2019   Type 2 diabetes mellitus with diabetic peripheral angiopathy without gangrene (HCC) 08/16/2019   COVID-19 virus infection 08/08/2019   Acute right ankle pain 07/27/2019   Tinnitus 05/26/2019   OSA (obstructive sleep apnea) 10/07/2018   Resistant hypertension 09/23/2018   Secondary hyperparathyroidism (HCC) 04/06/2018   Benign neoplasm of pituitary gland (HCC) 08/10/2017   Seizure (HCC)    Bilateral lower extremity edema 04/08/2017   Uncontrolled type 2 diabetes mellitus with diabetic nephropathy, with long-term current use of insulin 03/03/2017   Chronic kidney disease (CKD), stage IV (  severe) (HCC) 02/14/2014   Morbid obesity (HCC) 05/20/2013   Erectile dysfunction associated with type 2 diabetes mellitus (HCC) 06/02/2012   Macular edema 12/12/2011   Mixed hyperlipidemia due to type 2 diabetes mellitus (HCC) 11/11/2011    Past Surgical History:  Procedure Laterality Date   A/V FISTULAGRAM Left 07/25/2022   Procedure: A/V Fistulagram;  Surgeon: Cephus Shelling, MD;  Location: [facility] INVASIVE CV LAB;  Service: Cardiovascular;  Laterality: Left;   AV FISTULA PLACEMENT Left 11/11/2019   Procedure: LEFT FOREARM  RADIOCEPHALIC ARTERIOVENOUS (AV) FISTULA CREATION;  Surgeon: Cephus Shelling, MD;  Location: [facility] OR;  Service: Vascular;  Laterality: Left;   AV FISTULA PLACEMENT Left 08/14/2022   Procedure: FIRST STAGE BRACHIOBASILIC LEFT ARM ARTERIOVENOUS (AV) FISTULA CREATION;  Surgeon: Cephus Shelling, MD;  Location: [facility] OR;  Service: Vascular;  Laterality: Left;   BASCILIC VEIN TRANSPOSITION Left 10/30/2022   Procedure: LEFT ARM SECOND STAGE BASILIC VEIN TRANSPOSITION;  Surgeon: Cephus Shelling, MD;  Location: [facility] OR;  Service: Vascular;  Laterality: Left;   Eyelid surgery Right    I & D EXTREMITY  11/11/2011   Procedure: IRRIGATION AND DEBRIDEMENT EXTREMITY;  Surgeon: Robyne Askew, MD;  Location: [facility] OR;  Service: General;  Laterality: Right;  I&D Right Thigh Abscess   INSERTION OF DIALYSIS CATHETER N/A 07/25/2022   Procedure: INSERTION OF DIALYSIS CATHETER;  Surgeon: Cephus Shelling, MD;  Location: [facility];  Service: Vascular;  Laterality: N/A;   IR FLUORO GUIDE CV LINE RIGHT  08/12/2019   IR US GUIDE VASC ACCESS RIGHT  08/12/2019   LIGATION OF ARTERIOVENOUS  FISTULA Left 08/14/2022   Procedure: LIGATION OF BRACHIOCEPHALIC ARTERIOVENOUS  FISTULA;  Surgeon: Cephus Shelling, MD;  Location: [facility] OR;  Service: Vascular;  Laterality: Left;   REVISON OF ARTERIOVENOUS FISTULA Left 03/23/2020   Procedure: LEFT ARM ARTERIOVENOUS FISTULA REVISON, LIGATION OF SIDE BRANCHES AND ELEVATION;  Surgeon: Cephus Shelling, MD;  Location: [facility];  Service: Vascular;  Laterality: Left;       Home Medications    Prior to Admission medications   Medication Sig Start Date End Date Taking? Authorizing Provider  atorvastatin (LIPITOR) 40 MG tablet Take 1 tablet (40 mg total) by mouth daily. 10/24/22  Yes Hoy Register, MD  Blood Glucose Monitoring Suppl (CONTOUR NEXT EZ) w/Device KIT Use to check blood sugar three times daily. 08/06/22  Yes Hoy Register, MD  [facility] ASPIRIN LOW DOSE 81 MG tablet TAKE 1 TABLET BY  MOUTH EVERY DAY 12/17/22  Yes Claiborne Rigg, NP  diclofenac Sodium (VOLTAREN) 1 % GEL Apply 4 g topically 4 (four) times daily. 08/20/22  Yes Claiborne Rigg, NP  Doxercalciferol (HECTOROL IV) Doxercalciferol (Hectorol) 09/02/22 09/01/23 Yes [provider]  enalapril (VASOTEC) 5 MG tablet Take 1 tablet (5 mg total) by mouth daily. 10/24/22  Yes Newlin, Enobong, MD  glucose blood (CONTOUR NEXT TEST) test strip Use to check blood sugar three times daily. 08/06/22  Yes Newlin, Odette Horns, MD  insulin glargine (LANTUS SOLOSTAR) 100 UNIT/ML Solostar Pen Inject 50 Units into the skin daily. 10/24/22  Yes Newlin, Odette Horns, MD  Insulin Lispro Prot & Lispro (HUMALOG MIX 75/25 KWIKPEN) (75-25) 100 UNIT/ML Kwikpen Inject into the skin. 09/18/22  Yes [provider]  Insulin Pen Needle (B-D ULTRAFINE III SHORT PEN) 31G X 8 MM MISC 1 application  by Does not apply route 3 (three) times daily. E11.65 05/16/22  Yes Anders Simmonds, PA-C  Lancet Devices ([facility]  NEXT LANCING DEVICE) MISC 1 Device by Does not apply route 3 (three) times daily. 11/30/19  Yes Claiborne Rigg, NP  LASIX 80 MG tablet Take by mouth. 09/25/22  Yes [provider]  levETIRAcetam (KEPPRA XR) 500 MG 24 hr tablet Take 1 tablet (500 mg total) by mouth in the morning and at bedtime. 03/04/23  Yes Butch Penny, NP  Microlet Lancets MISC Use to check blood sugar three times daily. E11.65 08/06/22  Yes Newlin, Odette Horns, MD  Semaglutide,0.25 or 0.5MG /DOS, (OZEMPIC, 0.25 OR 0.5 MG/DOSE,) 2 MG/3ML SOPN Inject into the skin. 09/18/22  Yes [provider]  SENSIPAR 30 MG tablet Take 30 mg by mouth 3 (three) times a week. 11/15/22  Yes [provider]  VELPHORO 500 MG chewable tablet Chew 500 mg by mouth 3 (three) times daily with meals. 02/07/20  Yes [provider]  HYDROcodone-acetaminophen (NORCO/VICODIN) 5-325 MG tablet Take 1 tablet by mouth every 8 (eight) hours as needed for up to 3 days for moderate pain  or severe pain. 03/05/23 03/08/23  , [place] N, FNP    Family History Family History  Problem Relation Age of Onset   Diabetes Mother    Heart disease Mother 37   Diabetes Sister    Diabetes Brother    Diabetes Brother    Cancer Maternal Uncle        type unknown   Seizures Niece    Stroke Neg Hx     Social History Social History   Tobacco Use   Smoking status: Never    Passive exposure: Never   Smokeless tobacco: Never  Vaping Use   Vaping status: Never Used  Substance Use Topics   Alcohol use: Not Currently   Drug use: No     Allergies   Amlodipine   Review of Systems Review of Systems  Gastrointestinal:  Positive for abdominal pain. Negative for diarrhea, nausea and vomiting.  Musculoskeletal:  Positive for back pain and gait problem.     Physical Exam Triage Vital Signs ED Triage Vitals  Encounter Vitals Group     BP      Systolic BP Percentile      Diastolic BP Percentile      Pulse      Resp      Temp      Temp src      SpO2      Weight      Height      Head Circumference      Peak Flow      Pain Score      Pain Loc      Pain Education      Exclude from Growth Chart    No data found.  Updated Vital Signs BP (!) 177/91 (BP Location: Right Arm)   Pulse 71   Temp 97.8 F (36.6 C) (Oral)   SpO2 96%   Visual Acuity Right Eye Distance:   Left Eye Distance:   Bilateral Distance:    Right Eye Near:   Left Eye Near:    Bilateral Near:     Physical Exam Vitals and nursing note reviewed.  Constitutional:      Appearance: Normal appearance. He is well-developed.  HENT:     Head: Normocephalic and atraumatic.     Right Ear: External ear normal.     Left Ear: External ear normal.     Nose: Nose normal.     Mouth/Throat:     Mouth: Mucous membranes  are moist.  Eyes:     General: No scleral icterus. Cardiovascular:     Rate and Rhythm: Normal rate and regular rhythm.     Heart sounds: Normal heart sounds. No murmur  heard. Pulmonary:     Effort: Pulmonary effort is normal. No respiratory distress.     Breath sounds: Normal breath sounds.  Abdominal:     General: Bowel sounds are normal. There is distension.     Palpations: Abdomen is soft.     Tenderness: There is no abdominal tenderness.     Hernia: No hernia is present.  Musculoskeletal:     Cervical back: Normal.     Lumbar back: Positive right straight leg raise test and positive left straight leg raise test.       Back:     Comments: Reports lower back pain that spreads to his entire back with standing / movement.   Skin:    General: Skin is warm and dry.  Neurological:     General: No focal deficit present.     Mental Status: He is alert and oriented to person, place, and time.  Psychiatric:        Mood and Affect: Mood normal.        Behavior: Behavior normal.      UC Treatments / Results  Labs (all labs ordered are listed, but only abnormal results are displayed) Labs Reviewed - No data to display  EKG   Radiology No results found.  Procedures Procedures (including critical care time)  Medications Ordered in UC Medications - No data to display  Initial Impression / Assessment and Plan / UC Course  I have reviewed the triage vital signs and the nursing notes.  Pertinent labs & imaging results that were available during my care of the patient were reviewed by me and considered in my medical decision making (see chart for details).  Vitals and triage reviewed, patient is hemodynamically stable.  Presents to clinic for chronic back pain and intermittent abdominal pains.  Abdomen is distended with fluid buildup, reports this is baseline.  Active bowel sounds and nontender.  Without nausea, vomiting, fever or tachycardia, low concern for acute abdomen or need for emergent imaging.  Lumbar back pain with bilateral sciatica, will send in a few days of Norco as this was attempted to orthopedics but his pharmacy was out of this  medication.  Encouraged to follow-up with orthopedics for injection tomorrow and reschedule dialysis.  Emergency precautions given, plan of care and return precautions discussed, no questions at this time.     Final Clinical Impressions(s) / UC Diagnoses   Final diagnoses:  Chronic bilateral low back pain with bilateral sciatica  ESRD on dialysis [facility])     Discharge Instructions      Please keep your scheduled appointment with orthopedics tomorrow for your injection.  If your abdominal pain or back pain get worse, please head to the nearest emergency department for further evaluation.  It is important that you receive dialysis, please reschedule this as soon as possible.     ED Prescriptions     Medication Sig Dispense Auth. Provider   HYDROcodone-acetaminophen (NORCO/VICODIN) 5-325 MG tablet Take 1 tablet by mouth every 8 (eight) hours as needed for up to 3 days for moderate pain or severe pain. 10 tablet , [place] N, [place]      I have reviewed the PDMP during this encounter.   Rinaldo Ratel [place] N, [place] 03/05/23 [phone]

## 2023-03-05 NOTE — Discharge Instructions (Addendum)
Please keep your scheduled appointment with orthopedics tomorrow for your injection.  If your abdominal pain or back pain get worse, please head to the nearest emergency department for further evaluation.  It is important that you receive dialysis, please reschedule this as soon as possible.

## 2023-03-06 ENCOUNTER — Inpatient Hospital Stay
Admission: RE | Admit: 2023-03-06 | Discharge: 2023-03-06 | Disposition: A | Discharge status: Home / Self Care | Payer: Medicare Other | Source: Ambulatory Visit | Attending: Physical Medicine and Rehabilitation | Admitting: Physical Medicine and Rehabilitation

## 2023-03-06 NOTE — Discharge Instructions (Signed)

## 2023-03-07 DIAGNOSIS — N2581 Secondary hyperparathyroidism of renal origin: Secondary | ICD-10-CM | POA: Diagnosis not present

## 2023-03-07 DIAGNOSIS — E1129 Type 2 diabetes mellitus with other diabetic kidney complication: Secondary | ICD-10-CM | POA: Diagnosis not present

## 2023-03-07 DIAGNOSIS — R739 Hyperglycemia, unspecified: Secondary | ICD-10-CM | POA: Diagnosis not present

## 2023-03-07 DIAGNOSIS — D509 Iron deficiency anemia, unspecified: Secondary | ICD-10-CM | POA: Diagnosis not present

## 2023-03-07 DIAGNOSIS — N186 End stage renal disease: Secondary | ICD-10-CM | POA: Diagnosis not present

## 2023-03-07 DIAGNOSIS — Z992 Dependence on renal dialysis: Secondary | ICD-10-CM | POA: Diagnosis not present

## 2023-03-10 DIAGNOSIS — E1129 Type 2 diabetes mellitus with other diabetic kidney complication: Secondary | ICD-10-CM | POA: Diagnosis not present

## 2023-03-10 DIAGNOSIS — D509 Iron deficiency anemia, unspecified: Secondary | ICD-10-CM | POA: Diagnosis not present

## 2023-03-10 DIAGNOSIS — N186 End stage renal disease: Secondary | ICD-10-CM | POA: Diagnosis not present

## 2023-03-10 DIAGNOSIS — R739 Hyperglycemia, unspecified: Secondary | ICD-10-CM | POA: Diagnosis not present

## 2023-03-10 DIAGNOSIS — N2581 Secondary hyperparathyroidism of renal origin: Secondary | ICD-10-CM | POA: Diagnosis not present

## 2023-03-10 DIAGNOSIS — Z992 Dependence on renal dialysis: Secondary | ICD-10-CM | POA: Diagnosis not present

## 2023-03-11 ENCOUNTER — Ambulatory Visit: Payer: Medicare Other | Admitting: Podiatry

## 2023-03-11 ENCOUNTER — Other Ambulatory Visit: Payer: Self-pay | Admitting: Nurse Practitioner

## 2023-03-11 ENCOUNTER — Ambulatory Visit: Payer: Medicare Other | Attending: Internal Medicine | Admitting: Pharmacist

## 2023-03-11 ENCOUNTER — Encounter: Payer: Self-pay | Admitting: Pharmacist

## 2023-03-11 ENCOUNTER — Other Ambulatory Visit: Payer: Self-pay

## 2023-03-11 VITALS — BP 160/79 | HR 70

## 2023-03-11 DIAGNOSIS — Z794 Long term (current) use of insulin: Secondary | ICD-10-CM

## 2023-03-11 DIAGNOSIS — I1A0 Resistant hypertension: Secondary | ICD-10-CM

## 2023-03-11 DIAGNOSIS — G629 Polyneuropathy, unspecified: Secondary | ICD-10-CM

## 2023-03-11 DIAGNOSIS — E1151 Type 2 diabetes mellitus with diabetic peripheral angiopathy without gangrene: Secondary | ICD-10-CM

## 2023-03-11 LAB — POCT GLYCOSYLATED HEMOGLOBIN (HGB A1C): HbA1c, POC (controlled diabetic range): 6.8 % (ref 0.0–7.0)

## 2023-03-11 MED ORDER — AMLODIPINE BESYLATE 5 MG PO TABS
5.0000 mg | ORAL_TABLET | ORAL | 2 refills | Status: DC
  Filled 2023-03-11: qty 16, 28d supply, fill #0

## 2023-03-11 MED ORDER — GABAPENTIN 100 MG PO CAPS
100.0000 mg | ORAL_CAPSULE | ORAL | 3 refills | Status: DC
Start: 2023-03-12 — End: 2024-04-29

## 2023-03-11 NOTE — Progress Notes (Signed)
S:     No chief complaint on file.  57 y.o. male who presents for diabetes evaluation, education, and management.  PMH is significant for T2DM w/ ESRD (MWF HD), anemia of chronic disease, resistant HTN, OSA, HLD, obesity, epilepsy. Patient was originally referred by Zelda on 08/20/2022. I've been involved in his care since 06/2022 and last saw him in 02/04/2023. I did not make any DM medication changes. Regarding his HTN, his BP fluctuates and he is adamant that his BP is soft during dialysis and high on non-dialysis days. For this reason, it is difficult to adjust his antihypertensives. He did take his enalapril this morning.  Today, patient arrives in good spirits and presents without any assistance. He has no complaints today. He is checking via finger stick and prefers not to use libre at this time.  For his BP, Mr. Goodbar tells me today that his blood pressure has been elevated. On non-dialysis days, he takes the enalapril. He did take the enalapril this morning.    Family/Social History:  -Fhx: DM, heart disease, seizure disorder, stroke -Tobacco: never smoker  -Alcohol: none reported   Current diabetes medications include: Lantus 50 units daily (sometimes will adjust if hypo to 45u), Humalog 5 or 6 units TID Current hypertension medications include: enalapril 5 mg (only taking on non-dialysis days) - this is limited by dizziness and lightheadedness.   Insurance coverage: BCBS  Patient denies nocturia (nighttime urination).  Patient reports neuropathy (nerve pain). Patient denies visual changes. Patient reports self foot exams.   Patient reported no changes from previous dietary habits:  -Non-adherent to sodium restriction. "Still in that salt game" -Drinks regular Pepsi and Mt. Dew daily.  -Does not limit carbohydrates but tries to avoid sweets. -Meals: does not eat at regular intervals.   Patient-reported exercise habits: walking and yard work  O:  14 day average blood  glucose: 131 mg/dL Range since 8/1: 161 - 171 mg/dL  Lab Results  Component Value Date   HGBA1C 6.8 03/11/2023   Vitals:   03/11/23 0907  BP: (!) 160/79  Pulse: 70    Lipid Panel     Component Value Date/Time   CHOL 261 (H) 02/04/2022 1129   TRIG 222 (H) 02/04/2022 1129   HDL 54 02/04/2022 1129   CHOLHDL 4.8 02/04/2022 1129   CHOLHDL 3.1 02/09/2016 1045   VLDL 11 02/09/2016 1045   LDLCALC 166 (H) 02/04/2022 1129   Clinical Atherosclerotic Cardiovascular Disease (ASCVD): No  The 10-year ASCVD risk score (Arnett DK, et al., 2019) is: 31.3%   Values used to calculate the score:     Age: 57 years     Sex: Male     Is Non-Hispanic African American: Yes     Diabetic: Yes     Tobacco smoker: No     Systolic Blood Pressure: 160 mmHg     Is BP treated: Yes     HDL Cholesterol: 54 mg/dL     Total Cholesterol: 261 mg/dL   A/P: Diabetes longstanding currently at to goal. Home sugars are at goal. Of note, he does have a hx of ESRD and his A1c may not be accurate in the setting of anemia of chronic disease. His H/H levels were normal on last check in March. His A1c seems to correlate with home blood sugar readings. For this reason, we will hold off on checking fructosamine today. Patient is able to verbalize appropriate hypoglycemia management plan. Medication adherence appears to be fair.  -  Continue Lantus 50 units daily.  -Continue Humalog 6u TID before meals.  -Patient educated on purpose, proper use, and potential adverse effects of Lantus, Humalog.  -Extensively discussed pathophysiology of diabetes, recommended lifestyle interventions, dietary effects on blood sugar control.  -Counseled on s/sx of and management of hypoglycemia.  -Next A1c anticipated 05/2023.  Hypertension longstanding currently above goal. Blood pressure goal of <130/80 mmHg. Patient is now taking enalapril and took this morning. We will add a low dose amlodipine on non-HD days.  -Continue enalapril 5 mg  daily on non-HD -Start amlodipine 5 mg daily on non-HD days   Written patient instructions provided. Patient verbalized understanding of treatment plan.  Total time in face to face counseling 45 minutes.    Follow-up:  Pharmacist prn. PCP clinic visit 04/08/2023.  Butch Penny, PharmD, [name], CPP Clinical Pharmacist [facility] [phone]

## 2023-03-12 ENCOUNTER — Emergency Department (HOSPITAL_COMMUNITY): Payer: Medicare Other

## 2023-03-12 ENCOUNTER — Other Ambulatory Visit: Payer: Self-pay

## 2023-03-12 ENCOUNTER — Encounter (HOSPITAL_COMMUNITY): Payer: Self-pay

## 2023-03-12 ENCOUNTER — Emergency Department (HOSPITAL_COMMUNITY)
Admission: EM | Admit: 2023-03-12 | Discharge: 2023-03-12 | Disposition: A | Discharge status: Home / Self Care | Payer: Medicare Other | Attending: Emergency Medicine | Admitting: Emergency Medicine

## 2023-03-12 DIAGNOSIS — N186 End stage renal disease: Secondary | ICD-10-CM | POA: Diagnosis not present

## 2023-03-12 DIAGNOSIS — Z992 Dependence on renal dialysis: Secondary | ICD-10-CM | POA: Insufficient documentation

## 2023-03-12 DIAGNOSIS — G5613 Other lesions of median nerve, bilateral upper limbs: Secondary | ICD-10-CM | POA: Diagnosis not present

## 2023-03-12 DIAGNOSIS — G5611 Other lesions of median nerve, right upper limb: Secondary | ICD-10-CM | POA: Insufficient documentation

## 2023-03-12 DIAGNOSIS — R739 Hyperglycemia, unspecified: Secondary | ICD-10-CM | POA: Diagnosis not present

## 2023-03-12 DIAGNOSIS — D509 Iron deficiency anemia, unspecified: Secondary | ICD-10-CM | POA: Diagnosis not present

## 2023-03-12 DIAGNOSIS — R918 Other nonspecific abnormal finding of lung field: Secondary | ICD-10-CM | POA: Diagnosis not present

## 2023-03-12 DIAGNOSIS — R079 Chest pain, unspecified: Secondary | ICD-10-CM | POA: Diagnosis not present

## 2023-03-12 DIAGNOSIS — E1129 Type 2 diabetes mellitus with other diabetic kidney complication: Secondary | ICD-10-CM | POA: Diagnosis not present

## 2023-03-12 DIAGNOSIS — Z794 Long term (current) use of insulin: Secondary | ICD-10-CM | POA: Diagnosis not present

## 2023-03-12 DIAGNOSIS — N2581 Secondary hyperparathyroidism of renal origin: Secondary | ICD-10-CM | POA: Diagnosis not present

## 2023-03-12 DIAGNOSIS — G5610 Other lesions of median nerve, unspecified upper limb: Secondary | ICD-10-CM

## 2023-03-12 DIAGNOSIS — R202 Paresthesia of skin: Secondary | ICD-10-CM | POA: Diagnosis not present

## 2023-03-12 LAB — BASIC METABOLIC PANEL
Anion gap: 17 — ABNORMAL HIGH (ref 5–15)
BUN: 17 mg/dL (ref 6–20)
CO2: 31 mmol/L (ref 22–32)
Calcium: 8.7 mg/dL — ABNORMAL LOW (ref 8.9–10.3)
Chloride: 90 mmol/L — ABNORMAL LOW (ref 98–111)
Creatinine, Ser: 5.87 mg/dL — ABNORMAL HIGH (ref 0.61–1.24)
GFR, Estimated: 11 mL/min — ABNORMAL LOW (ref >60)
Glucose, Bld: 116 mg/dL — ABNORMAL HIGH (ref 70–99)
Potassium: 3.9 mmol/L (ref 3.5–5.1)
Sodium: 138 mmol/L (ref 135–145)

## 2023-03-12 LAB — CBC
HCT: 37.2 % — ABNORMAL LOW (ref 39.0–52.0)
Hemoglobin: 11.8 g/dL — ABNORMAL LOW (ref 13.0–17.0)
MCH: 25.7 pg — ABNORMAL LOW (ref 26.0–34.0)
MCHC: 31.7 g/dL (ref 30.0–36.0)
MCV: 81 fL (ref 80.0–100.0)
Platelets: 292 10*3/uL (ref 150–400)
RBC: 4.59 MIL/uL (ref 4.22–5.81)
RDW: 14.6 % (ref 11.5–15.5)
WBC: 7.7 10*3/uL (ref 4.0–10.5)
nRBC: 0 % (ref 0.0–0.2)

## 2023-03-12 LAB — TROPONIN I (HIGH SENSITIVITY)
Troponin I (High Sensitivity): 18 ng/L — ABNORMAL HIGH (ref <18)
Troponin I (High Sensitivity): 17 ng/L (ref <18)

## 2023-03-12 LAB — CBG MONITORING, ED: Glucose-Capillary: 106 mg/dL — ABNORMAL HIGH (ref 70–99)

## 2023-03-12 NOTE — Discharge Instructions (Signed)

## 2023-03-12 NOTE — ED Triage Notes (Signed)
Pt c/o right hand numbness and painx2wk. Pt c/o tingling in left finger. Pt c/o tingling in right side of chest started this morning at dialysis.

## 2023-03-12 NOTE — Discharge Instructions (Addendum)
The most common thing that would make you have tingling in this distribution would be carpal tunnel syndrome.  This is something that typically gets better with splinting.  Especially at night.  As you are having symptoms that occurred during dialysis and you have an AV fistula you need to mention this to your dialysis center and your vascular surgeon because they may want to do further testing to make sure it is not a issue with your blood flow.

## 2023-03-12 NOTE — ED Provider Triage Note (Signed)
Emergency Medicine Provider Triage Evaluation Note  Jeremy [name]. , a 58 y.o. male  was evaluated in triage.  Pt complains of bilateral hand numbness and pain x 2-3 weeks.  Notes the numbness is localized to his right third fourth and fifth digits.  Also notes tingling to his left pinky.  Also reports concerns for right sided chest pain with a sharp sensation while at dialysis.  He was able to get 3 hours of his 4-hour dialysis session today.  Denies shortness of breath, nausea, vomiting, headache, neck pain.  No anticoagulant use at this time.   Review of Systems  Positive:  Negative:   Physical Exam  BP 127/63   Pulse 66   Temp 98 F (36.7 C)   Resp 17   Ht 5\' 8"  (1.727 m)   Wt 128.4 kg   SpO2 95%   BMI 43.04 kg/m  Gen:   Awake, no distress   Resp:  Normal effort  MSK:   Moves extremities without difficulty  Other:  Negative pronator drift. Strength and sensation intact to BUE and BLE. Able to ambulate without assistance or difficulty. No chest wall TTP.   Medical Decision Making  Medically screening exam initiated at 2:30 PM.  Appropriate orders placed.  [name]. was informed that the remainder of the evaluation will be completed by another provider, this initial triage assessment does not replace that evaluation, and the importance of remaining in the ED until their evaluation is complete.  Work up initiated.    Ziva Nunziata A, PA-C 03/12/23 1440

## 2023-03-12 NOTE — ED Provider Notes (Signed)
[place] EMERGENCY DEPARTMENT AT [facility] Provider Note   CSN: [id number] Arrival date & time: 03/12/23  9562     History  Chief Complaint  Patient presents with   Hand Pain    Jeremy Ishmael. is a 57 y.o. male.  57 yo M with a chief complaints of bilateral hand tingling.  Going on for a couple weeks.  Seems occur in the first and second digit on the left hand in the first second third digit on the right.  Mostly on the palmar aspect.  Seems to be worse with certain movements Jeremy [name] said as Jeremy [name] was trying to cut the grass Jeremy [name] felt like it had gotten worse and Jeremy [name] shook it for a while and it got better.  Jeremy [name] also seems to notice it occur when Jeremy [name] gets dialysis done.  Had dialysis session today.  Had a completed.  Denies trauma.  Has a left-sided AV fistula.  Had a recent revision.   Hand Pain       Home Medications Prior to Admission medications   Medication Sig Start Date End Date Taking? Authorizing Provider  amLODipine (NORVASC) 5 MG tablet Take 1 tablet by mouth daily on non-dialysis days (Tues, Thursday, Sat, Sun). Do not take on MWF. 03/11/23   Hoy Register, MD  atorvastatin (LIPITOR) 40 MG tablet Take 1 tablet (40 mg total) by mouth daily. 10/24/22   Hoy Register, MD  Blood Glucose Monitoring Suppl (CONTOUR NEXT EZ) w/Device KIT Use to check blood sugar three times daily. 08/06/22   Hoy Register, MD  [facility] ASPIRIN LOW DOSE 81 MG tablet TAKE 1 TABLET BY MOUTH EVERY DAY 12/17/22   Claiborne Rigg, NP  diclofenac Sodium (VOLTAREN) 1 % GEL Apply 4 g topically 4 (four) times daily. 08/20/22   Claiborne Rigg, NP  Doxercalciferol (HECTOROL IV) Doxercalciferol (Hectorol) 09/02/22 09/01/23  [provider]  enalapril (VASOTEC) 5 MG tablet Take 1 tablet (5 mg total) by mouth daily. 10/24/22   Hoy Register, MD  gabapentin (NEURONTIN) 100 MG capsule Take 1 capsule (100 mg total) by mouth 3 (three) times a week. Take on HD days after HD (hemodialysis) and in the  evenings. 03/12/23   Claiborne Rigg, NP  glucose blood (CONTOUR NEXT TEST) test strip Use to check blood sugar three times daily. 08/06/22   Hoy Register, MD  insulin glargine (LANTUS SOLOSTAR) 100 UNIT/ML Solostar Pen Inject 50 Units into the skin daily. 10/24/22   Hoy Register, MD  Insulin Lispro Prot & Lispro (HUMALOG MIX 75/25 KWIKPEN) (75-25) 100 UNIT/ML Kwikpen Inject into the skin. 09/18/22   [provider]  Insulin Pen Needle (B-D ULTRAFINE III SHORT PEN) 31G X 8 MM MISC 1 application  by Does not apply route 3 (three) times daily. E11.65 05/16/22   Anders Simmonds, PA-C  Lancet Devices (MICROLET NEXT LANCING DEVICE) MISC 1 Device by Does not apply route 3 (three) times daily. 11/30/19   Claiborne Rigg, NP  LASIX 80 MG tablet Take by mouth. 09/25/22   [provider]  levETIRAcetam (KEPPRA XR) 500 MG 24 hr tablet Take 1 tablet (500 mg total) by mouth in the morning and at bedtime. 03/04/23   Butch Penny, NP  Microlet Lancets MISC Use to check blood sugar three times daily. E11.65 08/06/22   Hoy Register, MD  Semaglutide,0.25 or 0.5MG /DOS, (OZEMPIC, 0.25 OR 0.5 MG/DOSE,) 2 MG/3ML SOPN Inject into the skin. 09/18/22   [provider]  SENSIPAR 30 MG  tablet Take 30 mg by mouth 3 (three) times a week. 11/15/22   [provider]  VELPHORO 500 MG chewable tablet Chew 500 mg by mouth 3 (three) times daily with meals. 02/07/20   [provider]      Allergies    Amlodipine    Review of Systems   Review of Systems  Physical Exam Updated Vital Signs BP 127/63   Pulse 66   Temp 98 F (36.7 C)   Resp 17   Ht 5\' 8"  (1.727 m)   Wt 128.4 kg   SpO2 95%   BMI 43.04 kg/m  Physical Exam Vitals and nursing note reviewed.  Constitutional:      Appearance: Jeremy [name] is well-developed.  HENT:     Head: Normocephalic and atraumatic.  Eyes:     Pupils: Pupils are equal, round, and reactive to light.  Neck:     Vascular: No JVD.  Cardiovascular:      Rate and Rhythm: Normal rate and regular rhythm.     Heart sounds: No murmur heard.    No friction rub. No gallop.  Pulmonary:     Effort: No respiratory distress.     Breath sounds: No wheezing.  Abdominal:     General: There is no distension.     Tenderness: There is no abdominal tenderness. There is no guarding or rebound.  Musculoskeletal:        General: Normal range of motion.     Cervical back: Normal range of motion and neck supple.     Comments: Pulse motor and sensation intact bilateral extremities.  Negative Tinel's test at the median canal.  Negative Phalen's test.  Skin:    Coloration: Skin is not pale.     Findings: No rash.  Neurological:     Mental Status: Jeremy [name] is alert and oriented to person, place, and time.  Psychiatric:        Behavior: Behavior normal.     ED Results / Procedures / Treatments   Labs (all labs ordered are listed, but only abnormal results are displayed) Labs Reviewed  BASIC METABOLIC PANEL - Abnormal; Notable for the following components:      Result Value   Chloride 90 (*)    Glucose, Bld 116 (*)    Creatinine, Ser 5.87 (*)    Calcium 8.7 (*)    GFR, Estimated 11 (*)    Anion gap 17 (*)    All other components within normal limits  CBC - Abnormal; Notable for the following components:   Hemoglobin 11.8 (*)    HCT 37.2 (*)    MCH 25.7 (*)    All other components within normal limits  CBG MONITORING, ED - Abnormal; Notable for the following components:   Glucose-Capillary 106 (*)    All other components within normal limits  TROPONIN I (HIGH SENSITIVITY) - Abnormal; Notable for the following components:   Troponin I (High Sensitivity) 18 (*)    All other components within normal limits  TROPONIN I (HIGH SENSITIVITY)    EKG None  Radiology DG Chest 2 View  Result Date: 03/12/2023 CLINICAL DATA:  Chest pain. EXAM: CHEST - 2 VIEW COMPARISON:  07/25/2022. FINDINGS: There are probable patchy atelectatic changes in the left  retrocardiac region. Bilateral lung fields are otherwise clear. Bilateral costophrenic angles are clear. Stable cardio-mediastinal silhouette. No acute osseous abnormalities. The soft tissues are within normal limits. IMPRESSION: 1. Probable patchy left retrocardiac atelectasis. Electronically Signed   By: Jules Schick  M.D.   On: 03/12/2023 10:59    Procedures Procedures    Medications Ordered in ED Medications - No data to display  ED Course/ Medical Decision Making/ A&P                                 Medical Decision Making Amount and/or Complexity of Data Reviewed Labs: ordered. Radiology: ordered.   57 yo M with a chief complaints of paresthesias to the first second and third digit of the right hand in the first and second digit of the left.  I suspect most likely this is carpal tunnel syndrome.  The only complicating factor is the patient is on dialysis and Jeremy [name] tells me Jeremy [name] has worse symptoms with dialysis and did not notice it until Jeremy [name] had revision of his left AV fistula.  I think it is less likely to be steal syndrome.  Jeremy [name] has no obvious signs of ischemia to the hand.  I encouraged him to follow-up with his vascular surgeon as well as his dialysis center.  Will have him splint his wrist bilaterally at night.  Given orthopedic follow-up as well.  4:12 PM:  I have discussed the diagnosis/risks/treatment options with the patient.  Evaluation and diagnostic testing in the emergency department does not suggest an emergent condition requiring admission or immediate intervention beyond what has been performed at this time.  They will follow up with PCP, ortho, vascular. We also discussed returning to the ED immediately if new or worsening sx occur. We discussed the sx which are most concerning (e.g., sudden worsening pain, fever, inability to tolerate by mouth) that necessitate immediate return. Medications administered to the patient during their visit and any new prescriptions provided to the  patient are listed below.  Medications given during this visit Medications - No data to display   The patient appears reasonably screen and/or stabilized for discharge and I doubt any other medical condition or other [facility] requiring further screening, evaluation, or treatment in the ED at this time prior to discharge.          Final Clinical Impression(s) / ED Diagnoses Final diagnoses:  Median nerve neuropathy, unspecified laterality    Rx / DC Orders ED Discharge Orders     None         Melene Plan, DO 03/12/23 1612

## 2023-03-13 ENCOUNTER — Ambulatory Visit
Admission: RE | Admit: 2023-03-13 | Discharge: 2023-03-13 | Disposition: A | Discharge status: Home / Self Care | Payer: Medicare Other | Source: Ambulatory Visit | Attending: Physical Medicine and Rehabilitation | Admitting: Physical Medicine and Rehabilitation

## 2023-03-13 DIAGNOSIS — M48062 Spinal stenosis, lumbar region with neurogenic claudication: Secondary | ICD-10-CM

## 2023-03-13 DIAGNOSIS — M4727 Other spondylosis with radiculopathy, lumbosacral region: Secondary | ICD-10-CM | POA: Diagnosis not present

## 2023-03-13 DIAGNOSIS — G8929 Other chronic pain: Secondary | ICD-10-CM

## 2023-03-13 DIAGNOSIS — M47816 Spondylosis without myelopathy or radiculopathy, lumbar region: Secondary | ICD-10-CM

## 2023-03-13 MED ORDER — IOPAMIDOL (ISOVUE-M 200) INJECTION 41%
1.0000 mL | Freq: Once | INTRAMUSCULAR | Status: AC
  Administered 2023-03-13: 1 mL via EPIDURAL

## 2023-03-13 MED ORDER — METHYLPREDNISOLONE ACETATE 40 MG/ML INJ SUSP (RADIOLOG
80.0000 mg | Freq: Once | INTRAMUSCULAR | Status: AC
  Administered 2023-03-13: 80 mg via EPIDURAL

## 2023-03-14 DIAGNOSIS — N186 End stage renal disease: Secondary | ICD-10-CM | POA: Diagnosis not present

## 2023-03-14 DIAGNOSIS — E1129 Type 2 diabetes mellitus with other diabetic kidney complication: Secondary | ICD-10-CM | POA: Diagnosis not present

## 2023-03-14 DIAGNOSIS — D509 Iron deficiency anemia, unspecified: Secondary | ICD-10-CM | POA: Diagnosis not present

## 2023-03-14 DIAGNOSIS — Z992 Dependence on renal dialysis: Secondary | ICD-10-CM | POA: Diagnosis not present

## 2023-03-14 DIAGNOSIS — R739 Hyperglycemia, unspecified: Secondary | ICD-10-CM | POA: Diagnosis not present

## 2023-03-14 DIAGNOSIS — N2581 Secondary hyperparathyroidism of renal origin: Secondary | ICD-10-CM | POA: Diagnosis not present

## 2023-03-17 ENCOUNTER — Telehealth: Payer: Self-pay

## 2023-03-17 DIAGNOSIS — D509 Iron deficiency anemia, unspecified: Secondary | ICD-10-CM | POA: Diagnosis not present

## 2023-03-17 DIAGNOSIS — R739 Hyperglycemia, unspecified: Secondary | ICD-10-CM | POA: Diagnosis not present

## 2023-03-17 DIAGNOSIS — N2581 Secondary hyperparathyroidism of renal origin: Secondary | ICD-10-CM | POA: Diagnosis not present

## 2023-03-17 DIAGNOSIS — Z992 Dependence on renal dialysis: Secondary | ICD-10-CM | POA: Diagnosis not present

## 2023-03-17 DIAGNOSIS — E1129 Type 2 diabetes mellitus with other diabetic kidney complication: Secondary | ICD-10-CM | POA: Diagnosis not present

## 2023-03-17 DIAGNOSIS — N186 End stage renal disease: Secondary | ICD-10-CM | POA: Diagnosis not present

## 2023-03-17 NOTE — Transitions of Care (Post Inpatient/ED Visit) (Signed)
   03/17/2023  Name: Jeremy [name]. MRN: [id number] DOB: 1965-09-10  Today's TOC FU Call Status: Today's TOC FU Call Status:: Unsuccessful Call (1st Attempt) Unsuccessful Call (1st Attempt) Date: 03/17/23  Attempted to reach the patient regarding the most recent Inpatient/ED visit.  Follow Up Plan: Additional outreach attempts will be made to reach the patient to complete the Transitions of Care (Post Inpatient/ED visit) call.     Antionette Fairy, RN,BSN,CCM [facility] Care Management Care Management Community Coordinator Direct Phone: [phone] Toll Free: [phone] Fax: [phone]

## 2023-03-18 ENCOUNTER — Telehealth: Payer: Self-pay

## 2023-03-18 ENCOUNTER — Ambulatory Visit (INDEPENDENT_AMBULATORY_CARE_PROVIDER_SITE_OTHER): Payer: Medicare Other | Admitting: Podiatry

## 2023-03-18 DIAGNOSIS — B351 Tinea unguium: Secondary | ICD-10-CM | POA: Diagnosis not present

## 2023-03-18 DIAGNOSIS — M79675 Pain in left toe(s): Secondary | ICD-10-CM

## 2023-03-18 DIAGNOSIS — M79674 Pain in right toe(s): Secondary | ICD-10-CM

## 2023-03-18 NOTE — Transitions of Care (Post Inpatient/ED Visit) (Signed)
   03/18/2023  Name: Xylan Lampshire. MRN: [id number] DOB: 06/10/1966  Today's TOC FU Call Status: Today's TOC FU Call Status:: Unsuccessful Call (2nd Attempt) Unsuccessful Call (2nd Attempt) Date: 03/18/23  Attempted to reach the patient regarding the most recent Inpatient/ED visit.  Follow Up Plan: Additional outreach attempts will be made to reach the patient to complete the Transitions of Care (Post Inpatient/ED visit) call.     Antionette Fairy, RN,BSN,CCM [facility] Care Management Care Management Community Coordinator Direct Phone: [phone] Toll Free: [phone] Fax: [phone]

## 2023-03-18 NOTE — Progress Notes (Unsigned)
       Subjective:  Patient ID: Jeremy [name]., male    DOB: 02-24-1966,  MRN: [id number]   Jeremy [name]. presents to clinic today for:  Chief Complaint  Patient presents with   Nail Problem    [facility]   Patient notes nails are thick, discolored, elongated and painful in shoegear when trying to ambulate.  Blood sugars been around 100 mg/dL he notes he gets his dialysis on Mondays, Wednesdays, and Fridays  PCP is Claiborne Rigg, NP.  Past Medical History:  Diagnosis Date   Abscess    AKI (acute kidney injury) (HCC) 08/05/2017   Bell's palsy    right eye abn since dx bell's palsy   CKD (chronic kidney disease)    Dr. McDiarmind    COVID 2020   mild   Dyspnea    occasional with exertion   ESRD (end stage renal disease) (HCC)    MWF -[facility]   Headache    migraines   High cholesterol    Hypertension    Left shoulder pain 10/26/2013   S/p injection on 10/26/13    Renal insufficiency 02/14/2014   Seizures (HCC) 08/04/2017   "related to high blood sugars" (08/05/2017)   Type II diabetes mellitus (HCC)     Allergies  Allergen Reactions   Amlodipine Other (See Comments)    Dizziness    Review of Systems: Negative except as noted in the HPI.  Objective:  There were no vitals filed for this visit.  Jeremy [name]. is a pleasant 57 y.o. male in NAD. AAO x 3.  Vascular Examination: Capillary refill time is 3-5 seconds to toes bilateral. Palpable pedal pulses b/l LE. Digital hair present b/l.  Skin temperature gradient WNL b/l. No varicosities b/l. No cyanosis noted b/l.   Dermatological Examination: Pedal skin with normal turgor, texture and tone b/l. No open wounds. No interdigital macerations b/l. Toenails x10 are 3mm thick, discolored, dystrophic with subungual debris. There is pain with compression of the nail plates.  They are elongated x10     Latest Ref Rng & Units 03/11/2023    9:04 AM 12/05/2022    8:49 AM 08/20/2022    8:52 AM 05/16/2022   10:12 AM   Hemoglobin A1C  Hemoglobin-A1c 0.0 - 7.0 % 6.8  7.4  11.9  13.4    Assessment/Plan: 1. Pain due to onychomycosis of toenails of both feet     The mycotic toenails were sharply debrided x10 with sterile nail nippers and a power debriding burr to decrease bulk/thickness and length.    Return in about 3 months (around 06/18/2023) for [facility].   Clerance Lav, DPM, FACFAS [facility]     [address] [place], [place] [address]                Office [phone]  Fax [phone]

## 2023-03-18 NOTE — Transitions of Care (Post Inpatient/ED Visit) (Signed)
   03/18/2023  Name: Jeremy [name]. MRN: [id number] DOB: 08-24-1965  Today's TOC FU Call Status: Today's TOC FU Call Status:: Unsuccessful Call (3rd Attempt) Unsuccessful Call (3rd Attempt) Date: 03/18/23  Attempted to reach the patient regarding the most recent Inpatient/ED visit.  Follow Up Plan: No further outreach attempts will be made at this time. We have been unable to contact the patient.  Antionette Fairy, RN,BSN,CCM [facility] Care Management Care Management Community Coordinator Direct Phone: [phone] Toll Free: [phone] Fax: [phone]

## 2023-03-20 DIAGNOSIS — E1129 Type 2 diabetes mellitus with other diabetic kidney complication: Secondary | ICD-10-CM | POA: Diagnosis not present

## 2023-03-20 DIAGNOSIS — D509 Iron deficiency anemia, unspecified: Secondary | ICD-10-CM | POA: Diagnosis not present

## 2023-03-20 DIAGNOSIS — Z992 Dependence on renal dialysis: Secondary | ICD-10-CM | POA: Diagnosis not present

## 2023-03-20 DIAGNOSIS — R739 Hyperglycemia, unspecified: Secondary | ICD-10-CM | POA: Diagnosis not present

## 2023-03-20 DIAGNOSIS — N2581 Secondary hyperparathyroidism of renal origin: Secondary | ICD-10-CM | POA: Diagnosis not present

## 2023-03-20 DIAGNOSIS — N186 End stage renal disease: Secondary | ICD-10-CM | POA: Diagnosis not present

## 2023-03-21 DIAGNOSIS — E1129 Type 2 diabetes mellitus with other diabetic kidney complication: Secondary | ICD-10-CM | POA: Diagnosis not present

## 2023-03-21 DIAGNOSIS — R739 Hyperglycemia, unspecified: Secondary | ICD-10-CM | POA: Diagnosis not present

## 2023-03-21 DIAGNOSIS — N186 End stage renal disease: Secondary | ICD-10-CM | POA: Diagnosis not present

## 2023-03-21 DIAGNOSIS — Z992 Dependence on renal dialysis: Secondary | ICD-10-CM | POA: Diagnosis not present

## 2023-03-21 DIAGNOSIS — D509 Iron deficiency anemia, unspecified: Secondary | ICD-10-CM | POA: Diagnosis not present

## 2023-03-21 DIAGNOSIS — N2581 Secondary hyperparathyroidism of renal origin: Secondary | ICD-10-CM | POA: Diagnosis not present

## 2023-03-22 DIAGNOSIS — N186 End stage renal disease: Secondary | ICD-10-CM | POA: Diagnosis not present

## 2023-03-22 DIAGNOSIS — I129 Hypertensive chronic kidney disease with stage 1 through stage 4 chronic kidney disease, or unspecified chronic kidney disease: Secondary | ICD-10-CM | POA: Diagnosis not present

## 2023-03-22 DIAGNOSIS — Z992 Dependence on renal dialysis: Secondary | ICD-10-CM | POA: Diagnosis not present

## 2023-03-24 DIAGNOSIS — R739 Hyperglycemia, unspecified: Secondary | ICD-10-CM | POA: Diagnosis not present

## 2023-03-24 DIAGNOSIS — N186 End stage renal disease: Secondary | ICD-10-CM | POA: Diagnosis not present

## 2023-03-24 DIAGNOSIS — N2581 Secondary hyperparathyroidism of renal origin: Secondary | ICD-10-CM | POA: Diagnosis not present

## 2023-03-24 DIAGNOSIS — D631 Anemia in chronic kidney disease: Secondary | ICD-10-CM | POA: Diagnosis not present

## 2023-03-24 DIAGNOSIS — Z992 Dependence on renal dialysis: Secondary | ICD-10-CM | POA: Diagnosis not present

## 2023-03-24 DIAGNOSIS — D509 Iron deficiency anemia, unspecified: Secondary | ICD-10-CM | POA: Diagnosis not present

## 2023-03-24 DIAGNOSIS — E1129 Type 2 diabetes mellitus with other diabetic kidney complication: Secondary | ICD-10-CM | POA: Diagnosis not present

## 2023-03-28 DIAGNOSIS — N2581 Secondary hyperparathyroidism of renal origin: Secondary | ICD-10-CM | POA: Diagnosis not present

## 2023-03-28 DIAGNOSIS — R739 Hyperglycemia, unspecified: Secondary | ICD-10-CM | POA: Diagnosis not present

## 2023-03-28 DIAGNOSIS — D631 Anemia in chronic kidney disease: Secondary | ICD-10-CM | POA: Diagnosis not present

## 2023-03-28 DIAGNOSIS — N186 End stage renal disease: Secondary | ICD-10-CM | POA: Diagnosis not present

## 2023-03-28 DIAGNOSIS — D509 Iron deficiency anemia, unspecified: Secondary | ICD-10-CM | POA: Diagnosis not present

## 2023-03-28 DIAGNOSIS — Z992 Dependence on renal dialysis: Secondary | ICD-10-CM | POA: Diagnosis not present

## 2023-03-28 DIAGNOSIS — E1129 Type 2 diabetes mellitus with other diabetic kidney complication: Secondary | ICD-10-CM | POA: Diagnosis not present

## 2023-03-30 ENCOUNTER — Other Ambulatory Visit: Payer: Self-pay | Admitting: Nurse Practitioner

## 2023-03-30 DIAGNOSIS — D688 Other specified coagulation defects: Secondary | ICD-10-CM

## 2023-03-31 DIAGNOSIS — D509 Iron deficiency anemia, unspecified: Secondary | ICD-10-CM | POA: Diagnosis not present

## 2023-03-31 DIAGNOSIS — R739 Hyperglycemia, unspecified: Secondary | ICD-10-CM | POA: Diagnosis not present

## 2023-03-31 DIAGNOSIS — E1129 Type 2 diabetes mellitus with other diabetic kidney complication: Secondary | ICD-10-CM | POA: Diagnosis not present

## 2023-03-31 DIAGNOSIS — D631 Anemia in chronic kidney disease: Secondary | ICD-10-CM | POA: Diagnosis not present

## 2023-03-31 DIAGNOSIS — N2581 Secondary hyperparathyroidism of renal origin: Secondary | ICD-10-CM | POA: Diagnosis not present

## 2023-03-31 DIAGNOSIS — N186 End stage renal disease: Secondary | ICD-10-CM | POA: Diagnosis not present

## 2023-03-31 DIAGNOSIS — Z992 Dependence on renal dialysis: Secondary | ICD-10-CM | POA: Diagnosis not present

## 2023-04-02 DIAGNOSIS — Z7409 Other reduced mobility: Secondary | ICD-10-CM | POA: Diagnosis not present

## 2023-04-02 DIAGNOSIS — M5416 Radiculopathy, lumbar region: Secondary | ICD-10-CM | POA: Diagnosis not present

## 2023-04-04 DIAGNOSIS — D509 Iron deficiency anemia, unspecified: Secondary | ICD-10-CM | POA: Diagnosis not present

## 2023-04-04 DIAGNOSIS — N186 End stage renal disease: Secondary | ICD-10-CM | POA: Diagnosis not present

## 2023-04-04 DIAGNOSIS — N2581 Secondary hyperparathyroidism of renal origin: Secondary | ICD-10-CM | POA: Diagnosis not present

## 2023-04-04 DIAGNOSIS — D631 Anemia in chronic kidney disease: Secondary | ICD-10-CM | POA: Diagnosis not present

## 2023-04-04 DIAGNOSIS — R739 Hyperglycemia, unspecified: Secondary | ICD-10-CM | POA: Diagnosis not present

## 2023-04-04 DIAGNOSIS — E1129 Type 2 diabetes mellitus with other diabetic kidney complication: Secondary | ICD-10-CM | POA: Diagnosis not present

## 2023-04-04 DIAGNOSIS — Z992 Dependence on renal dialysis: Secondary | ICD-10-CM | POA: Diagnosis not present

## 2023-04-07 DIAGNOSIS — E1129 Type 2 diabetes mellitus with other diabetic kidney complication: Secondary | ICD-10-CM | POA: Diagnosis not present

## 2023-04-07 DIAGNOSIS — D509 Iron deficiency anemia, unspecified: Secondary | ICD-10-CM | POA: Diagnosis not present

## 2023-04-07 DIAGNOSIS — N186 End stage renal disease: Secondary | ICD-10-CM | POA: Diagnosis not present

## 2023-04-07 DIAGNOSIS — D631 Anemia in chronic kidney disease: Secondary | ICD-10-CM | POA: Diagnosis not present

## 2023-04-07 DIAGNOSIS — R739 Hyperglycemia, unspecified: Secondary | ICD-10-CM | POA: Diagnosis not present

## 2023-04-07 DIAGNOSIS — Z992 Dependence on renal dialysis: Secondary | ICD-10-CM | POA: Diagnosis not present

## 2023-04-07 DIAGNOSIS — N2581 Secondary hyperparathyroidism of renal origin: Secondary | ICD-10-CM | POA: Diagnosis not present

## 2023-04-08 ENCOUNTER — Ambulatory Visit: Payer: Medicare Other | Admitting: Nurse Practitioner

## 2023-04-09 DIAGNOSIS — R739 Hyperglycemia, unspecified: Secondary | ICD-10-CM | POA: Diagnosis not present

## 2023-04-09 DIAGNOSIS — Z992 Dependence on renal dialysis: Secondary | ICD-10-CM | POA: Diagnosis not present

## 2023-04-09 DIAGNOSIS — D509 Iron deficiency anemia, unspecified: Secondary | ICD-10-CM | POA: Diagnosis not present

## 2023-04-09 DIAGNOSIS — N186 End stage renal disease: Secondary | ICD-10-CM | POA: Diagnosis not present

## 2023-04-09 DIAGNOSIS — D631 Anemia in chronic kidney disease: Secondary | ICD-10-CM | POA: Diagnosis not present

## 2023-04-09 DIAGNOSIS — N2581 Secondary hyperparathyroidism of renal origin: Secondary | ICD-10-CM | POA: Diagnosis not present

## 2023-04-09 DIAGNOSIS — E1129 Type 2 diabetes mellitus with other diabetic kidney complication: Secondary | ICD-10-CM | POA: Diagnosis not present

## 2023-04-11 DIAGNOSIS — D509 Iron deficiency anemia, unspecified: Secondary | ICD-10-CM | POA: Diagnosis not present

## 2023-04-11 DIAGNOSIS — R739 Hyperglycemia, unspecified: Secondary | ICD-10-CM | POA: Diagnosis not present

## 2023-04-11 DIAGNOSIS — D631 Anemia in chronic kidney disease: Secondary | ICD-10-CM | POA: Diagnosis not present

## 2023-04-11 DIAGNOSIS — E1129 Type 2 diabetes mellitus with other diabetic kidney complication: Secondary | ICD-10-CM | POA: Diagnosis not present

## 2023-04-11 DIAGNOSIS — N2581 Secondary hyperparathyroidism of renal origin: Secondary | ICD-10-CM | POA: Diagnosis not present

## 2023-04-11 DIAGNOSIS — Z992 Dependence on renal dialysis: Secondary | ICD-10-CM | POA: Diagnosis not present

## 2023-04-11 DIAGNOSIS — N186 End stage renal disease: Secondary | ICD-10-CM | POA: Diagnosis not present

## 2023-04-14 DIAGNOSIS — N186 End stage renal disease: Secondary | ICD-10-CM | POA: Diagnosis not present

## 2023-04-14 DIAGNOSIS — Z992 Dependence on renal dialysis: Secondary | ICD-10-CM | POA: Diagnosis not present

## 2023-04-14 DIAGNOSIS — D631 Anemia in chronic kidney disease: Secondary | ICD-10-CM | POA: Diagnosis not present

## 2023-04-14 DIAGNOSIS — E1129 Type 2 diabetes mellitus with other diabetic kidney complication: Secondary | ICD-10-CM | POA: Diagnosis not present

## 2023-04-14 DIAGNOSIS — D509 Iron deficiency anemia, unspecified: Secondary | ICD-10-CM | POA: Diagnosis not present

## 2023-04-14 DIAGNOSIS — R739 Hyperglycemia, unspecified: Secondary | ICD-10-CM | POA: Diagnosis not present

## 2023-04-14 DIAGNOSIS — N2581 Secondary hyperparathyroidism of renal origin: Secondary | ICD-10-CM | POA: Diagnosis not present

## 2023-04-15 ENCOUNTER — Encounter: Payer: Self-pay | Admitting: Physician Assistant

## 2023-04-15 ENCOUNTER — Ambulatory Visit (INDEPENDENT_AMBULATORY_CARE_PROVIDER_SITE_OTHER): Payer: Medicare Other | Admitting: Physician Assistant

## 2023-04-15 DIAGNOSIS — M5416 Radiculopathy, lumbar region: Secondary | ICD-10-CM

## 2023-04-15 NOTE — Progress Notes (Signed)
Office Visit Note   Patient: Jeremy [name].           Date of Birth: Apr 14, 1966           MRN: [id number] Visit Date: 04/15/2023              Requested by: Claiborne Rigg, NP [address] [place],  [place] [address] PCP: Claiborne Rigg, NP  Chief Complaint  Patient presents with   Left Hip - Follow-up      HPI: Mr. Addario is a pleasant 57 year old gentleman with a long history of low back pain.  He is a patient of Ellin Goodie and Dr. Alvester Morin.  He was having recurrent issues he did well with the injection he received from Dr. Alvester Morin.  He did not want to wait so he was referred to [facility] imaging.  In August he had an interlaminar L4 5 injection on the left.  He presents today saying it did not help at all.  Has moderate to severe pain denies any loss of bowel or bladder control  Assessment & Plan: Visit Diagnoses: Low back pain  Plan: Unfortunately patient was for scheduled parity.  I explained to him that I will be sure he gets into see [facility] tomorrow.  She had suggested referral to Dr. Christell Constant for an opinion and I told him I be happy to do this.  He says he does not want to do this and thinks there is something more wrong with his back.  Have given him a new appointment for tomorrow at 230  Follow-Up Instructions: No follow-ups on file.   Ortho Exam  Patient is alert, oriented, no adenopathy, well-dressed, normal affect, normal respiratory effort.   Imaging: No results found. No images are attached to the encounter.  Labs: Lab Results  Component Value Date   HGBA1C 6.8 03/11/2023   HGBA1C 7.4 (A) 12/05/2022   HGBA1C 11.9 (A) 08/20/2022   ESRSEDRATE 1 09/23/2018   CRP 1.7 (H) 08/13/2019   CRP 4.1 (H) 08/11/2019   CRP 7.2 (H) 08/10/2019   REPTSTATUS 08/21/2021 FINAL 08/18/2021   GRAMSTAIN  11/11/2011    NO WBC SEEN NO SQUAMOUS EPITHELIAL CELLS SEEN MODERATE GRAM POSITIVE COCCI IN PAIRS FEW GRAM NEGATIVE RODS   GRAMSTAIN  11/11/2011    NO  WBC SEEN NO SQUAMOUS EPITHELIAL CELLS SEEN MODERATE GRAM POSITIVE COCCI IN PAIRS FEW GRAM NEGATIVE RODS   CULT  08/18/2021    NO GROUP A STREP (S.PYOGENES) ISOLATED Performed at [facility], [address], [place], [place] [address]      Lab Results  Component Value Date   ALBUMIN 4.4 02/04/2022   ALBUMIN 3.9 02/23/2021   ALBUMIN 4.8 12/05/2020    Lab Results  Component Value Date   MG 1.8 08/05/2017   No results found for: "VD25OH"  No results found for: "PREALBUMIN"    Latest Ref Rng & Units 03/12/2023    9:52 AM 10/30/2022    8:41 AM 08/14/2022   10:42 AM  CBC EXTENDED  WBC 4.0 - 10.5 K/uL 7.7     RBC 4.22 - 5.81 MIL/uL 4.59     Hemoglobin 13.0 - 17.0 g/dL 74.2  59.5  63.8   HCT 39.0 - 52.0 % 37.2  39.0  40.0   Platelets 150 - 400 K/uL 292        There is no height or weight on file to calculate BMI.  Orders:  No orders of the  defined types were placed in this encounter.  No orders of the defined types were placed in this encounter.    Procedures: No procedures performed  Clinical Data: No additional findings.  ROS:  All other systems negative, except as noted in the HPI. Review of Systems  Objective: Vital Signs: There were no vitals taken for this visit.  Specialty Comments:  EXAM: MRI LUMBAR SPINE WITHOUT CONTRAST   TECHNIQUE: Multiplanar, multisequence MR imaging of the lumbar spine was performed. No intravenous contrast was administered.   COMPARISON:  Lumbar radiographs January 30, 2022.   FINDINGS: Segmentation: Standard segmentation is assumed. The inferior-most fully formed intervertebral disc labeled L5-S1.   Alignment:  No substantial sagittal subluxation.   Vertebrae: Vertebral body heights are maintained. No focal marrow edema chest acute fracture discitis/osteomyelitis. No suspicious bone lesions.   Conus medullaris and cauda equina: Conus extends to the L1-L2 level. Conus appears normal.   Paraspinal and other  soft tissues: Unremarkable.   Disc levels:   T12-L1: No significant disc protrusion, foraminal stenosis, or canal stenosis.   L1-L2: No significant disc protrusion, foraminal stenosis, or canal stenosis.   L2-L3: Mild disc bulging. No significant canal or foraminal stenosis.   L3-L4: Disc bulging, bilateral facet arthropathy, and mild prominence of the epidural fat. Resulting moderate to severe canal stenosis. Mild right greater than left foraminal stenosis.   L4-L5: Disc bulging with bilateral facet arthropathy. Mild superimposed prominence of the epidural fat. Resulting moderate to severe canal stenosis. Mild-to-moderate bilateral foraminal stenosis.   L5-S1: Mild bilateral facet arthropathy. Disc bulging with small annular fissure. Prominence of the epidural fat. Moderate canal stenosis. Patent foramina.   IMPRESSION: 1. Degenerative change and prominent epidural fat results in moderate to severe canal stenosis at L3-L4 L4-L5 and moderate canal stenosis at L5-S1. 2. Mild to moderate bilateral foraminal stenosis at L4-L5 and mild bilateral foraminal stenosis at L3-L4.     Electronically Signed   By: Feliberto Harts M.D.   On: 02/18/2022 09:09  PMFS History: Patient Active Problem List   Diagnosis Date Noted   Pain, unspecified 12/31/2021   Encounter for removal of sutures 10/05/2021   ESRD on dialysis (HCC) 11/09/2019   Allergy, unspecified, initial encounter 08/16/2019   Anaphylactic shock, unspecified, initial encounter 08/16/2019   Anemia in chronic kidney disease 08/16/2019   Complication of vascular dialysis catheter 08/16/2019   Fever, unspecified 08/16/2019   Iron deficiency anemia, unspecified 08/16/2019   Other specified coagulation defects (HCC) 08/16/2019   Pruritus, unspecified 08/16/2019   Shortness of breath 08/16/2019   Type 2 diabetes mellitus with diabetic peripheral angiopathy without gangrene (HCC) 08/16/2019   COVID-19 virus infection  08/08/2019   Acute right ankle pain 07/27/2019   Tinnitus 05/26/2019   OSA (obstructive sleep apnea) 10/07/2018   Resistant hypertension 09/23/2018   Secondary hyperparathyroidism (HCC) 04/06/2018   Benign neoplasm of pituitary gland (HCC) 08/10/2017   Seizure (HCC)    Bilateral lower extremity edema 04/08/2017   Uncontrolled type 2 diabetes mellitus with diabetic nephropathy, with long-term current use of insulin 03/03/2017   Chronic kidney disease (CKD), stage IV (severe) (HCC) 02/14/2014   Morbid obesity (HCC) 05/20/2013   Erectile dysfunction associated with type 2 diabetes mellitus (HCC) 06/02/2012   Macular edema 12/12/2011   Mixed hyperlipidemia due to type 2 diabetes mellitus (HCC) 11/11/2011   Past Medical History:  Diagnosis Date   Abscess    AKI (acute kidney injury) (HCC) 08/05/2017   Bell's palsy    right eye  abn since dx bell's palsy   CKD (chronic kidney disease)    Dr. McDiarmind    COVID 2020   mild   Dyspnea    occasional with exertion   ESRD (end stage renal disease) (HCC)    MWF -[facility]   Headache    migraines   High cholesterol    Hypertension    Left shoulder pain 10/26/2013   S/p injection on 10/26/13    Renal insufficiency 02/14/2014   Seizures (HCC) 08/04/2017   "related to high blood sugars" (08/05/2017)   Type II diabetes mellitus (HCC)     Family History  Problem Relation Age of Onset   Diabetes Mother    Heart disease Mother 70   Diabetes Sister    Diabetes Brother    Diabetes Brother    Cancer Maternal Uncle        type unknown   Seizures Niece    Stroke Neg Hx     Past Surgical History:  Procedure Laterality Date   A/V FISTULAGRAM Left 07/25/2022   Procedure: A/V Fistulagram;  Surgeon: Cephus Shelling, MD;  Location: [facility]ASIVE [facility];  Service: Cardiovascular;  Laterality: Left;   AV FISTULA PLACEMENT Left 11/11/2019   Procedure: LEFT FOREARM RADIOCEPHALIC ARTERIOVENOUS (AV) FISTULA CREATION;  Surgeon: Cephus Shelling, MD;  Location: [facility] OR;  Service: Vascular;  Laterality: Left;   AV FISTULA PLACEMENT Left 08/14/2022   Procedure: FIRST STAGE BRACHIOBASILIC LEFT ARM ARTERIOVENOUS (AV) FISTULA CREATION;  Surgeon: Cephus Shelling, MD;  Location: [facility] OR;  Service: Vascular;  Laterality: Left;   BASCILIC VEIN TRANSPOSITION Left 10/30/2022   Procedure: LEFT ARM SECOND STAGE BASILIC VEIN TRANSPOSITION;  Surgeon: Cephus Shelling, MD;  Location: [facility] OR;  Service: Vascular;  Laterality: Left;   Eyelid surgery Right    I & D EXTREMITY  11/11/2011   Procedure: IRRIGATION AND DEBRIDEMENT EXTREMITY;  Surgeon: Robyne Askew, MD;  Location: [facility] OR;  Service: General;  Laterality: Right;  I&D Right Thigh Abscess   INSERTION OF DIALYSIS CATHETER N/A 07/25/2022   Procedure: INSERTION OF DIALYSIS CATHETER;  Surgeon: Cephus Shelling, MD;  Location: [facility] OR;  Service: Vascular;  Laterality: N/A;   IR FLUORO GUIDE CV LINE RIGHT  08/12/2019   IR US GUIDE VASC ACCESS RIGHT  08/12/2019   LIGATION OF ARTERIOVENOUS  FISTULA Left 08/14/2022   Procedure: LIGATION OF BRACHIOCEPHALIC ARTERIOVENOUS  FISTULA;  Surgeon: Cephus Shelling, MD;  Location: [facility] OR;  Service: Vascular;  Laterality: Left;   REVISON OF ARTERIOVENOUS FISTULA Left 03/23/2020   Procedure: LEFT ARM ARTERIOVENOUS FISTULA REVISON, LIGATION OF SIDE BRANCHES AND ELEVATION;  Surgeon: Cephus Shelling, MD;  Location: [facility] OR;  Service: Vascular;  Laterality: Left;   Social History   Occupational History   Occupation: maintenance tech   Occupation: Disablity  Tobacco Use   Smoking status: Never    Passive exposure: Never   Smokeless tobacco: Never  Vaping Use   Vaping status: Never Used  Substance and Sexual Activity   Alcohol use: Not Currently   Drug use: No   Sexual activity: Yes

## 2023-04-16 ENCOUNTER — Encounter: Payer: Self-pay | Admitting: Physical Medicine and Rehabilitation

## 2023-04-16 ENCOUNTER — Ambulatory Visit (INDEPENDENT_AMBULATORY_CARE_PROVIDER_SITE_OTHER): Payer: Medicare Other | Admitting: Physical Medicine and Rehabilitation

## 2023-04-16 DIAGNOSIS — D631 Anemia in chronic kidney disease: Secondary | ICD-10-CM | POA: Diagnosis not present

## 2023-04-16 DIAGNOSIS — D1779 Benign lipomatous neoplasm of other sites: Secondary | ICD-10-CM

## 2023-04-16 DIAGNOSIS — M5441 Lumbago with sciatica, right side: Secondary | ICD-10-CM

## 2023-04-16 DIAGNOSIS — E1129 Type 2 diabetes mellitus with other diabetic kidney complication: Secondary | ICD-10-CM | POA: Diagnosis not present

## 2023-04-16 DIAGNOSIS — R739 Hyperglycemia, unspecified: Secondary | ICD-10-CM | POA: Diagnosis not present

## 2023-04-16 DIAGNOSIS — D509 Iron deficiency anemia, unspecified: Secondary | ICD-10-CM | POA: Diagnosis not present

## 2023-04-16 DIAGNOSIS — M5442 Lumbago with sciatica, left side: Secondary | ICD-10-CM

## 2023-04-16 DIAGNOSIS — M48062 Spinal stenosis, lumbar region with neurogenic claudication: Secondary | ICD-10-CM

## 2023-04-16 DIAGNOSIS — N186 End stage renal disease: Secondary | ICD-10-CM | POA: Diagnosis not present

## 2023-04-16 DIAGNOSIS — M5416 Radiculopathy, lumbar region: Secondary | ICD-10-CM

## 2023-04-16 DIAGNOSIS — N2581 Secondary hyperparathyroidism of renal origin: Secondary | ICD-10-CM | POA: Diagnosis not present

## 2023-04-16 DIAGNOSIS — Z992 Dependence on renal dialysis: Secondary | ICD-10-CM | POA: Diagnosis not present

## 2023-04-16 DIAGNOSIS — G8929 Other chronic pain: Secondary | ICD-10-CM

## 2023-04-16 NOTE — Progress Notes (Signed)
Functional Pain Scale - descriptive words and definitions  Unmanageable (7)  Pain interferes with normal ADL's/nothing seems to help/sleep is very difficult/active distractions are very difficult to concentrate on. Severe range order  Average Pain 7-10  Lower back pain with radiation in the legs. Sitting and lying down makes pain better

## 2023-04-16 NOTE — Progress Notes (Signed)
Jeremy [name]. - 57 y.o. male MRN [id number]  Date of birth: [birth year]  Office Visit Note: Visit Date: 04/16/2023 PCP: Claiborne Rigg, NP Referred by: Claiborne Rigg, NP  Subjective: Chief Complaint  Patient presents with   Lower Back - Pain   HPI: Jeremy [name]. is a 57 y.o. male who comes in today for evaluation of chronic, worsening and severe bilateral lower back pain radiating to lateral hips, buttocks and down legs, left greater than right. Pain ongoing for several years, worsens with prolonged standing and walking. He describes pain as sore, aching and numbness sensation, currently rats as 8 out of 10.  Some relief of pain with home exercise regimen, rest and use of medications. Sitting seems to alleviate his pain significantly. Takes Norco intermittently for severe pain, also uses Biofreeze as needed. Lumbar MRI imaging from 2023 exhibits prominent epidural fat resulting in moderate to severe canal stenosis at L3-L4 L4-L5 and moderate canal stenosis at L5-S1. He underwent right L4-L5 interlaminar epidural steroid injection in our office on 03/20/2022, he reports greater than 50% relief of pain for over 6 months with this procedure. More recently he underwent left L4-L5 interlaminar epidural steroid injection with [facility] on 01/30/2023 and 03/13/2023, he reports no relief of pain with these procedures. Patient denies focal weakness, numbness and tingling. No recent trauma or falls.   Patients course is complicated by diabetes mellitus, chronic kidney disease, OSA, and morbid obesity. He attends hemodialysis Monday, Wednesday and Friday.      Review of Systems  Musculoskeletal:  Positive for back pain.  Neurological:  Negative for tingling, sensory change, focal weakness and weakness.  All other systems reviewed and are negative.  Otherwise per HPI.  Assessment & Plan: Visit Diagnoses:    ICD-10-CM   1. Chronic bilateral low back pain with bilateral sciatica  M54.42  Ambulatory referral to Physical Medicine Rehab   M54.41    G89.29     2. Lumbar radiculopathy  M54.16 Ambulatory referral to Physical Medicine Rehab    3. Spinal stenosis of lumbar region with neurogenic claudication  M48.062 Ambulatory referral to Physical Medicine Rehab    4. Epidural lipomatosis  D17.79 Ambulatory referral to Physical Medicine Rehab       Plan: Findings:  Chronic, worsening and severe bilateral lower back pain radiating to lateral hips, buttocks and down legs, left greater than right. Patient continues to have severe pain despite good conservative therapies such as home exercise regimen, rest and use of medications. Patients clinical presentation and exam are consistent with neurogenic claudication as a result of spinal canal stenosis. There is moderate to severe spinal canal stenosis due to prominent epidural fat at L3-L4 and L4-L5, moderate at L5-S1. He received significant long lasting relief with prior injection in our office in 2023. More recently, injections from [facility] have were not beneficial in alleviating his pain. We discussed treatment plan in detail, next step is to perform bilateral L4 transforminal epidural steroid injection. We have yet to try transforaminal approach and feel this could be more helpful in alleviating his pain. I also feel patient would benefit from referral to our spine surgeon Dr. Willia Craze to discuss options. No red flag symptoms noted upon exam today.     Meds & Orders: No orders of the defined types were placed in this encounter.   Orders Placed This Encounter  Procedures   Ambulatory referral to Physical Medicine Rehab    Follow-up: Return for Bilateral L4  transforaminal epidural steroid injection.   Procedures: No procedures performed      Clinical History: EXAM: MRI LUMBAR SPINE WITHOUT CONTRAST   TECHNIQUE: Multiplanar, multisequence MR imaging of the lumbar spine was performed. No intravenous contrast was  administered.   COMPARISON:  Lumbar radiographs January 30, 2022.   FINDINGS: Segmentation: Standard segmentation is assumed. The inferior-most fully formed intervertebral disc labeled L5-S1.   Alignment:  No substantial sagittal subluxation.   Vertebrae: Vertebral body heights are maintained. No focal marrow edema chest acute fracture discitis/osteomyelitis. No suspicious bone lesions.   Conus medullaris and cauda equina: Conus extends to the L1-L2 level. Conus appears normal.   Paraspinal and other soft tissues: Unremarkable.   Disc levels:   T12-L1: No significant disc protrusion, foraminal stenosis, or canal stenosis.   L1-L2: No significant disc protrusion, foraminal stenosis, or canal stenosis.   L2-L3: Mild disc bulging. No significant canal or foraminal stenosis.   L3-L4: Disc bulging, bilateral facet arthropathy, and mild prominence of the epidural fat. Resulting moderate to severe canal stenosis. Mild right greater than left foraminal stenosis.   L4-L5: Disc bulging with bilateral facet arthropathy. Mild superimposed prominence of the epidural fat. Resulting moderate to severe canal stenosis. Mild-to-moderate bilateral foraminal stenosis.   L5-S1: Mild bilateral facet arthropathy. Disc bulging with small annular fissure. Prominence of the epidural fat. Moderate canal stenosis. Patent foramina.   IMPRESSION: 1. Degenerative change and prominent epidural fat results in moderate to severe canal stenosis at L3-L4 L4-L5 and moderate canal stenosis at L5-S1. 2. Mild to moderate bilateral foraminal stenosis at L4-L5 and mild bilateral foraminal stenosis at L3-L4.     Electronically Signed   By: Feliberto Harts M.D.   On: 02/18/2022 09:09   He reports that he has never smoked. He has never been exposed to tobacco smoke. He has never used smokeless tobacco.  Recent Labs    08/20/22 0852 12/05/22 0849 03/11/23 0904  HGBA1C 11.9* 7.4* 6.8    Objective:   VS:  HT:    WT:   BMI:     BP:   HR: bpm  TEMP: ( )  RESP:  Physical Exam Vitals and nursing note reviewed.  HENT:     Head: Normocephalic and atraumatic.     Right Ear: External ear normal.     Left Ear: External ear normal.     Nose: Nose normal.     Mouth/Throat:     Mouth: Mucous membranes are moist.  Eyes:     Extraocular Movements: Extraocular movements intact.  Cardiovascular:     Rate and Rhythm: Normal rate.     Pulses: Normal pulses.  Pulmonary:     Effort: Pulmonary effort is normal.  Abdominal:     General: Abdomen is flat. There is no distension.  Musculoskeletal:        General: Tenderness present.     Cervical back: Normal range of motion.     Comments: Patient rises from seated position to standing without difficulty. Good lumbar range of motion. No pain noted with facet loading. 5/5 strength noted with bilateral hip flexion, knee flexion/extension, ankle dorsiflexion/plantarflexion and EHL. No clonus noted bilaterally. No pain upon palpation of greater trochanters. No pain with internal/external rotation of bilateral hips. Sensation intact bilaterally. Negative slump test bilaterally. Ambulates without aid, gait steady.   Skin:    General: Skin is warm and dry.     Capillary Refill: Capillary refill takes less than 2 seconds.  Neurological:  General: No focal deficit present.     Mental Status: He is alert and oriented to person, place, and time.  Psychiatric:        Mood and Affect: Mood normal.        Behavior: Behavior normal.     Ortho Exam  Imaging: No results found.  Past Medical/Family/Surgical/Social History: Medications & Allergies reviewed per EMR, new medications updated. Patient Active Problem List   Diagnosis Date Noted   Pain, unspecified 12/31/2021   Encounter for removal of sutures 10/05/2021   ESRD on dialysis (HCC) 11/09/2019   Allergy, unspecified, initial encounter 08/16/2019   Anaphylactic shock, unspecified, initial  encounter 08/16/2019   Anemia in chronic kidney disease 08/16/2019   Complication of vascular dialysis catheter 08/16/2019   Fever, unspecified 08/16/2019   Iron deficiency anemia, unspecified 08/16/2019   Other specified coagulation defects (HCC) 08/16/2019   Pruritus, unspecified 08/16/2019   Shortness of breath 08/16/2019   Type 2 diabetes mellitus with diabetic peripheral angiopathy without gangrene (HCC) 08/16/2019   COVID-19 virus infection 08/08/2019   Acute right ankle pain 07/27/2019   Tinnitus 05/26/2019   OSA (obstructive sleep apnea) 10/07/2018   Resistant hypertension 09/23/2018   Secondary hyperparathyroidism (HCC) 04/06/2018   Benign neoplasm of pituitary gland (HCC) 08/10/2017   Seizure (HCC)    Bilateral lower extremity edema 04/08/2017   Uncontrolled type 2 diabetes mellitus with diabetic nephropathy, with long-term current use of insulin 03/03/2017   Chronic kidney disease (CKD), stage IV (severe) (HCC) 02/14/2014   Morbid obesity (HCC) 05/20/2013   Erectile dysfunction associated with type 2 diabetes mellitus (HCC) 06/02/2012   Macular edema 12/12/2011   Mixed hyperlipidemia due to type 2 diabetes mellitus (HCC) 11/11/2011   Past Medical History:  Diagnosis Date   Abscess    AKI (acute kidney injury) (HCC) 08/05/2017   Bell's palsy    right eye abn since dx bell's palsy   CKD (chronic kidney disease)    Dr. McDiarmind    COVID 2020   mild   Dyspnea    occasional with exertion   ESRD (end stage renal disease) (HCC)    MWF -[facility]   Headache    migraines   High cholesterol    Hypertension    Left shoulder pain 10/26/2013   S/p injection on 10/26/13    Renal insufficiency 02/14/2014   Seizures (HCC) 08/04/2017   "related to high blood sugars" (08/05/2017)   Type II diabetes mellitus (HCC)    Family History  Problem Relation Age of Onset   Diabetes Mother    Heart disease Mother 45   Diabetes Sister    Diabetes Brother    Diabetes Brother     Cancer Maternal Uncle        type unknown   Seizures Niece    Stroke Neg Hx    Past Surgical History:  Procedure Laterality Date   A/V FISTULAGRAM Left 07/25/2022   Procedure: A/V Fistulagram;  Surgeon: Cephus Shelling, MD;  Location: [facility];  Service: Cardiovascular;  Laterality: Left;   AV FISTULA PLACEMENT Left 11/11/2019   Procedure: LEFT FOREARM RADIOCEPHALIC ARTERIOVENOUS (AV) FISTULA CREATION;  Surgeon: Cephus Shelling, MD;  Location: [facility] OR;  Service: Vascular;  Laterality: Left;   AV FISTULA PLACEMENT Left 08/14/2022   Procedure: FIRST STAGE BRACHIOBASILIC LEFT ARM ARTERIOVENOUS (AV) FISTULA CREATION;  Surgeon: Cephus Shelling, MD;  Location: [facility] OR;  Service: Vascular;  Laterality: Left;   BASCILIC VEIN TRANSPOSITION Left 10/30/2022  Procedure: LEFT ARM SECOND STAGE BASILIC VEIN TRANSPOSITION;  Surgeon: Cephus Shelling, MD;  Location: [facility] OR;  Service: Vascular;  Laterality: Left;   Eyelid surgery Right    I & D EXTREMITY  11/11/2011   Procedure: IRRIGATION AND DEBRIDEMENT EXTREMITY;  Surgeon: Robyne Askew, MD;  Location: [facility] OR;  Service: General;  Laterality: Right;  I&D Right Thigh Abscess   INSERTION OF DIALYSIS CATHETER N/A 07/25/2022   Procedure: INSERTION OF DIALYSIS CATHETER;  Surgeon: Cephus Shelling, MD;  Location: [facility] OR;  Service: Vascular;  Laterality: N/A;   IR FLUORO GUIDE CV LINE RIGHT  08/12/2019   IR US GUIDE VASC ACCESS RIGHT  08/12/2019   LIGATION OF ARTERIOVENOUS  FISTULA Left 08/14/2022   Procedure: LIGATION OF BRACHIOCEPHALIC ARTERIOVENOUS  FISTULA;  Surgeon: Cephus Shelling, MD;  Location: [facility] OR;  Service: Vascular;  Laterality: Left;   REVISON OF ARTERIOVENOUS FISTULA Left 03/23/2020   Procedure: LEFT ARM ARTERIOVENOUS FISTULA REVISON, LIGATION OF SIDE BRANCHES AND ELEVATION;  Surgeon: Cephus Shelling, MD;  Location: [facility] OR;  Service: Vascular;  Laterality: Left;   Social History   Occupational History   Occupation:  maintenance tech   Occupation: Disabl[employer]  Tobacco Use   Smoking status: Never    Passive exposure: Never   Smokeless tobacco: Never  Vaping Use   Vaping status: Never Used  Substance and Sexual Activity   Alcohol use: Not Currently   Drug use: No   Sexual activity: Yes

## 2023-04-21 DIAGNOSIS — R739 Hyperglycemia, unspecified: Secondary | ICD-10-CM | POA: Diagnosis not present

## 2023-04-21 DIAGNOSIS — Z992 Dependence on renal dialysis: Secondary | ICD-10-CM | POA: Diagnosis not present

## 2023-04-21 DIAGNOSIS — D509 Iron deficiency anemia, unspecified: Secondary | ICD-10-CM | POA: Diagnosis not present

## 2023-04-21 DIAGNOSIS — N186 End stage renal disease: Secondary | ICD-10-CM | POA: Diagnosis not present

## 2023-04-21 DIAGNOSIS — N2581 Secondary hyperparathyroidism of renal origin: Secondary | ICD-10-CM | POA: Diagnosis not present

## 2023-04-21 DIAGNOSIS — D631 Anemia in chronic kidney disease: Secondary | ICD-10-CM | POA: Diagnosis not present

## 2023-04-21 DIAGNOSIS — E1129 Type 2 diabetes mellitus with other diabetic kidney complication: Secondary | ICD-10-CM | POA: Diagnosis not present

## 2023-04-21 DIAGNOSIS — I129 Hypertensive chronic kidney disease with stage 1 through stage 4 chronic kidney disease, or unspecified chronic kidney disease: Secondary | ICD-10-CM | POA: Diagnosis not present

## 2023-04-23 DIAGNOSIS — D509 Iron deficiency anemia, unspecified: Secondary | ICD-10-CM | POA: Diagnosis not present

## 2023-04-23 DIAGNOSIS — N2581 Secondary hyperparathyroidism of renal origin: Secondary | ICD-10-CM | POA: Diagnosis not present

## 2023-04-23 DIAGNOSIS — N186 End stage renal disease: Secondary | ICD-10-CM | POA: Diagnosis not present

## 2023-04-23 DIAGNOSIS — E1129 Type 2 diabetes mellitus with other diabetic kidney complication: Secondary | ICD-10-CM | POA: Diagnosis not present

## 2023-04-23 DIAGNOSIS — D631 Anemia in chronic kidney disease: Secondary | ICD-10-CM | POA: Diagnosis not present

## 2023-04-23 DIAGNOSIS — Z992 Dependence on renal dialysis: Secondary | ICD-10-CM | POA: Diagnosis not present

## 2023-04-23 DIAGNOSIS — Z23 Encounter for immunization: Secondary | ICD-10-CM | POA: Diagnosis not present

## 2023-04-25 DIAGNOSIS — D509 Iron deficiency anemia, unspecified: Secondary | ICD-10-CM | POA: Diagnosis not present

## 2023-04-25 DIAGNOSIS — N186 End stage renal disease: Secondary | ICD-10-CM | POA: Diagnosis not present

## 2023-04-25 DIAGNOSIS — E1129 Type 2 diabetes mellitus with other diabetic kidney complication: Secondary | ICD-10-CM | POA: Diagnosis not present

## 2023-04-25 DIAGNOSIS — Z992 Dependence on renal dialysis: Secondary | ICD-10-CM | POA: Diagnosis not present

## 2023-04-25 DIAGNOSIS — D631 Anemia in chronic kidney disease: Secondary | ICD-10-CM | POA: Diagnosis not present

## 2023-04-25 DIAGNOSIS — N2581 Secondary hyperparathyroidism of renal origin: Secondary | ICD-10-CM | POA: Diagnosis not present

## 2023-04-25 DIAGNOSIS — Z23 Encounter for immunization: Secondary | ICD-10-CM | POA: Diagnosis not present

## 2023-04-28 DIAGNOSIS — Z23 Encounter for immunization: Secondary | ICD-10-CM | POA: Diagnosis not present

## 2023-04-28 DIAGNOSIS — E1129 Type 2 diabetes mellitus with other diabetic kidney complication: Secondary | ICD-10-CM | POA: Diagnosis not present

## 2023-04-28 DIAGNOSIS — D509 Iron deficiency anemia, unspecified: Secondary | ICD-10-CM | POA: Diagnosis not present

## 2023-04-28 DIAGNOSIS — N2581 Secondary hyperparathyroidism of renal origin: Secondary | ICD-10-CM | POA: Diagnosis not present

## 2023-04-28 DIAGNOSIS — N186 End stage renal disease: Secondary | ICD-10-CM | POA: Diagnosis not present

## 2023-04-28 DIAGNOSIS — D631 Anemia in chronic kidney disease: Secondary | ICD-10-CM | POA: Diagnosis not present

## 2023-04-28 DIAGNOSIS — Z992 Dependence on renal dialysis: Secondary | ICD-10-CM | POA: Diagnosis not present

## 2023-04-29 ENCOUNTER — Other Ambulatory Visit: Payer: Self-pay

## 2023-04-29 ENCOUNTER — Ambulatory Visit: Payer: Medicare Other | Admitting: Physical Medicine and Rehabilitation

## 2023-04-29 VITALS — BP 145/78 | HR 81

## 2023-04-29 DIAGNOSIS — M5416 Radiculopathy, lumbar region: Secondary | ICD-10-CM

## 2023-04-29 MED ORDER — METHYLPREDNISOLONE ACETATE 40 MG/ML IJ SUSP
40.0000 mg | Freq: Once | INTRAMUSCULAR | Status: AC
Start: 2023-04-29 — End: 2023-04-29
  Administered 2023-04-29: 40 mg

## 2023-04-29 NOTE — Patient Instructions (Signed)

## 2023-04-29 NOTE — Progress Notes (Signed)
Functional Pain Scale - descriptive words and definitions  Moderate (4)   Constantly aware of pain, can complete ADLs with modification/sleep marginally affected at times/passive distraction is of no use, but active distraction gives some relief. Moderate range order  Average Pain  varies on activity   +Driver, -BT, -Dye Allergies.  Lower back pain on both sides with radiation in the left upper leg

## 2023-04-30 DIAGNOSIS — D631 Anemia in chronic kidney disease: Secondary | ICD-10-CM | POA: Diagnosis not present

## 2023-04-30 DIAGNOSIS — E1129 Type 2 diabetes mellitus with other diabetic kidney complication: Secondary | ICD-10-CM | POA: Diagnosis not present

## 2023-04-30 DIAGNOSIS — Z992 Dependence on renal dialysis: Secondary | ICD-10-CM | POA: Diagnosis not present

## 2023-04-30 DIAGNOSIS — Z23 Encounter for immunization: Secondary | ICD-10-CM | POA: Diagnosis not present

## 2023-04-30 DIAGNOSIS — D509 Iron deficiency anemia, unspecified: Secondary | ICD-10-CM | POA: Diagnosis not present

## 2023-04-30 DIAGNOSIS — N186 End stage renal disease: Secondary | ICD-10-CM | POA: Diagnosis not present

## 2023-04-30 DIAGNOSIS — N2581 Secondary hyperparathyroidism of renal origin: Secondary | ICD-10-CM | POA: Diagnosis not present

## 2023-05-02 DIAGNOSIS — E1129 Type 2 diabetes mellitus with other diabetic kidney complication: Secondary | ICD-10-CM | POA: Diagnosis not present

## 2023-05-02 DIAGNOSIS — D631 Anemia in chronic kidney disease: Secondary | ICD-10-CM | POA: Diagnosis not present

## 2023-05-02 DIAGNOSIS — Z992 Dependence on renal dialysis: Secondary | ICD-10-CM | POA: Diagnosis not present

## 2023-05-02 DIAGNOSIS — Z23 Encounter for immunization: Secondary | ICD-10-CM | POA: Diagnosis not present

## 2023-05-02 DIAGNOSIS — D509 Iron deficiency anemia, unspecified: Secondary | ICD-10-CM | POA: Diagnosis not present

## 2023-05-02 DIAGNOSIS — N186 End stage renal disease: Secondary | ICD-10-CM | POA: Diagnosis not present

## 2023-05-02 DIAGNOSIS — N2581 Secondary hyperparathyroidism of renal origin: Secondary | ICD-10-CM | POA: Diagnosis not present

## 2023-05-07 DIAGNOSIS — Z23 Encounter for immunization: Secondary | ICD-10-CM | POA: Diagnosis not present

## 2023-05-07 DIAGNOSIS — E1129 Type 2 diabetes mellitus with other diabetic kidney complication: Secondary | ICD-10-CM | POA: Diagnosis not present

## 2023-05-07 DIAGNOSIS — Z992 Dependence on renal dialysis: Secondary | ICD-10-CM | POA: Diagnosis not present

## 2023-05-07 DIAGNOSIS — N2581 Secondary hyperparathyroidism of renal origin: Secondary | ICD-10-CM | POA: Diagnosis not present

## 2023-05-07 DIAGNOSIS — D509 Iron deficiency anemia, unspecified: Secondary | ICD-10-CM | POA: Diagnosis not present

## 2023-05-07 DIAGNOSIS — D631 Anemia in chronic kidney disease: Secondary | ICD-10-CM | POA: Diagnosis not present

## 2023-05-07 DIAGNOSIS — N186 End stage renal disease: Secondary | ICD-10-CM | POA: Diagnosis not present

## 2023-05-09 DIAGNOSIS — Z992 Dependence on renal dialysis: Secondary | ICD-10-CM | POA: Diagnosis not present

## 2023-05-09 DIAGNOSIS — N186 End stage renal disease: Secondary | ICD-10-CM | POA: Diagnosis not present

## 2023-05-09 DIAGNOSIS — D509 Iron deficiency anemia, unspecified: Secondary | ICD-10-CM | POA: Diagnosis not present

## 2023-05-09 DIAGNOSIS — D631 Anemia in chronic kidney disease: Secondary | ICD-10-CM | POA: Diagnosis not present

## 2023-05-09 DIAGNOSIS — N2581 Secondary hyperparathyroidism of renal origin: Secondary | ICD-10-CM | POA: Diagnosis not present

## 2023-05-09 DIAGNOSIS — E1129 Type 2 diabetes mellitus with other diabetic kidney complication: Secondary | ICD-10-CM | POA: Diagnosis not present

## 2023-05-09 DIAGNOSIS — Z23 Encounter for immunization: Secondary | ICD-10-CM | POA: Diagnosis not present

## 2023-05-12 DIAGNOSIS — E1129 Type 2 diabetes mellitus with other diabetic kidney complication: Secondary | ICD-10-CM | POA: Diagnosis not present

## 2023-05-12 DIAGNOSIS — D631 Anemia in chronic kidney disease: Secondary | ICD-10-CM | POA: Diagnosis not present

## 2023-05-12 DIAGNOSIS — N2581 Secondary hyperparathyroidism of renal origin: Secondary | ICD-10-CM | POA: Diagnosis not present

## 2023-05-12 DIAGNOSIS — Z992 Dependence on renal dialysis: Secondary | ICD-10-CM | POA: Diagnosis not present

## 2023-05-12 DIAGNOSIS — N186 End stage renal disease: Secondary | ICD-10-CM | POA: Diagnosis not present

## 2023-05-12 DIAGNOSIS — D509 Iron deficiency anemia, unspecified: Secondary | ICD-10-CM | POA: Diagnosis not present

## 2023-05-12 DIAGNOSIS — Z23 Encounter for immunization: Secondary | ICD-10-CM | POA: Diagnosis not present

## 2023-05-12 NOTE — Procedures (Signed)
Lumbosacral Transforaminal Epidural Steroid Injection - Sub-Pedicular Approach with Fluoroscopic Guidance  Patient: Jeremy [name].      Date of Birth: 01/23/1966 MRN: [id number] PCP: Claiborne Rigg, NP      Visit Date: 04/29/2023   Universal Protocol:    Date/Time: 04/29/2023  Consent Given By: the patient  Position: PRONE  Additional Comments: Vital signs were monitored before and after the procedure. Patient was prepped and draped in the usual sterile fashion. The correct patient, procedure, and site was verified.   Injection Procedure Details:   Procedure diagnoses: Lumbar radiculopathy [M54.16]    Meds Administered:  Meds ordered this encounter  Medications   methylPREDNISolone acetate (DEPO-MEDROL) injection 40 mg    Laterality: Bilateral  Location/Site: L4  Needle:5.0 in., 22 ga.  Short bevel or Quincke spinal needle  Needle Placement: Transforaminal  Findings:    -Comments: Excellent flow of contrast along the nerve, nerve root and into the epidural space.  Procedure Details: After squaring off the end-plates to get a true AP view, the C-arm was positioned so that an oblique view of the foramen as noted above was visualized. The target area is just inferior to the "nose of the scotty dog" or sub pedicular. The soft tissues overlying this structure were infiltrated with 2-3 ml. of 1% Lidocaine without Epinephrine.  The spinal needle was inserted toward the target using a "trajectory" view along the fluoroscope beam.  Under AP and lateral visualization, the needle was advanced so it did not puncture dura and was located close the 6 O'Clock position of the pedical in AP tracterory. Biplanar projections were used to confirm position. Aspiration was confirmed to be negative for CSF and/or blood. A 1-2 ml. volume of Isovue-250 was injected and flow of contrast was noted at each level. Radiographs were obtained for documentation purposes.   After attaining the  desired flow of contrast documented above, a 0.5 to 1.0 ml test dose of 0.25% Marcaine was injected into each respective transforaminal space.  The patient was observed for 90 seconds post injection.  After no sensory deficits were reported, and normal lower extremity motor function was noted,   the above injectate was administered so that equal amounts of the injectate were placed at each foramen (level) into the transforaminal epidural space.   Additional Comments:  No complications occurred Dressing: 2 x 2 sterile gauze and Band-Aid    Post-procedure details: Patient was observed during the procedure. Post-procedure instructions were reviewed.  Patient left the clinic in stable condition.

## 2023-05-12 NOTE — Progress Notes (Signed)
Jeremy [name]. - 57 y.o. male MRN [id number]  Date of birth: 20-Mar-1966  Office Visit Note: Visit Date: 04/29/2023 PCP: Claiborne Rigg, NP Referred by: Claiborne Rigg, NP  Subjective: Chief Complaint  Patient presents with   Lower Back - Pain   HPI:  [name]. is a 57 y.o. male who comes in today at the request of Ellin Goodie, FNP for planned Bilateral L4-5 Lumbar Transforaminal epidural steroid injection with fluoroscopic guidance.  The patient has failed conservative care including home exercise, medications, time and activity modification.  This injection will be diagnostic and hopefully therapeutic.  Please see requesting physician notes for further details and justification.   ROS Otherwise per HPI.  Assessment & Plan: Visit Diagnoses:    ICD-10-CM   1. Lumbar radiculopathy  M54.16 XR C-ARM NO REPORT    Epidural Steroid injection    methylPREDNISolone acetate (DEPO-MEDROL) injection 40 mg      Plan: No additional findings.   Meds & Orders:  Meds ordered this encounter  Medications   methylPREDNISolone acetate (DEPO-MEDROL) injection 40 mg    Orders Placed This Encounter  Procedures   XR C-ARM NO REPORT   Epidural Steroid injection    Follow-up: Return for visit to requesting provider as needed.   Procedures: No procedures performed  Lumbosacral Transforaminal Epidural Steroid Injection - Sub-Pedicular Approach with Fluoroscopic Guidance  Patient: Jeremy [name].      Date of Birth: Jan 14, 1966 MRN: [id number] PCP: Claiborne Rigg, NP      Visit Date: 04/29/2023   Universal Protocol:    Date/Time: 04/29/2023  Consent Given By: the patient  Position: PRONE  Additional Comments: Vital signs were monitored before and after the procedure. Patient was prepped and draped in the usual sterile fashion. The correct patient, procedure, and site was verified.   Injection Procedure Details:   Procedure diagnoses: Lumbar radiculopathy  [M54.16]    Meds Administered:  Meds ordered this encounter  Medications   methylPREDNISolone acetate (DEPO-MEDROL) injection 40 mg    Laterality: Bilateral  Location/Site: L4  Needle:5.0 in., 22 ga.  Short bevel or Quincke spinal needle  Needle Placement: Transforaminal  Findings:    -Comments: Excellent flow of contrast along the nerve, nerve root and into the epidural space.  Procedure Details: After squaring off the end-plates to get a true AP view, the C-arm was positioned so that an oblique view of the foramen as noted above was visualized. The target area is just inferior to the "nose of the scotty dog" or sub pedicular. The soft tissues overlying this structure were infiltrated with 2-3 ml. of 1% Lidocaine without Epinephrine.  The spinal needle was inserted toward the target using a "trajectory" view along the fluoroscope beam.  Under AP and lateral visualization, the needle was advanced so it did not puncture dura and was located close the 6 O'Clock position of the pedical in AP tracterory. Biplanar projections were used to confirm position. Aspiration was confirmed to be negative for CSF and/or blood. A 1-2 ml. volume of Isovue-250 was injected and flow of contrast was noted at each level. Radiographs were obtained for documentation purposes.   After attaining the desired flow of contrast documented above, a 0.5 to 1.0 ml test dose of 0.25% Marcaine was injected into each respective transforaminal space.  The patient was observed for 90 seconds post injection.  After no sensory deficits were reported, and normal lower extremity motor function was noted,   the above injectate  was administered so that equal amounts of the injectate were placed at each foramen (level) into the transforaminal epidural space.   Additional Comments:  No complications occurred Dressing: 2 x 2 sterile gauze and Band-Aid    Post-procedure details: Patient was observed during the  procedure. Post-procedure instructions were reviewed.  Patient left the clinic in stable condition.    Clinical History: EXAM: MRI LUMBAR SPINE WITHOUT CONTRAST   TECHNIQUE: Multiplanar, multisequence MR imaging of the lumbar spine was performed. No intravenous contrast was administered.   COMPARISON:  Lumbar radiographs January 30, 2022.   FINDINGS: Segmentation: Standard segmentation is assumed. The inferior-most fully formed intervertebral disc labeled L5-S1.   Alignment:  No substantial sagittal subluxation.   Vertebrae: Vertebral body heights are maintained. No focal marrow edema chest acute fracture discitis/osteomyelitis. No suspicious bone lesions.   Conus medullaris and cauda equina: Conus extends to the L1-L2 level. Conus appears normal.   Paraspinal and other soft tissues: Unremarkable.   Disc levels:   T12-L1: No significant disc protrusion, foraminal stenosis, or canal stenosis.   L1-L2: No significant disc protrusion, foraminal stenosis, or canal stenosis.   L2-L3: Mild disc bulging. No significant canal or foraminal stenosis.   L3-L4: Disc bulging, bilateral facet arthropathy, and mild prominence of the epidural fat. Resulting moderate to severe canal stenosis. Mild right greater than left foraminal stenosis.   L4-L5: Disc bulging with bilateral facet arthropathy. Mild superimposed prominence of the epidural fat. Resulting moderate to severe canal stenosis. Mild-to-moderate bilateral foraminal stenosis.   L5-S1: Mild bilateral facet arthropathy. Disc bulging with small annular fissure. Prominence of the epidural fat. Moderate canal stenosis. Patent foramina.   IMPRESSION: 1. Degenerative change and prominent epidural fat results in moderate to severe canal stenosis at L3-L4 L4-L5 and moderate canal stenosis at L5-S1. 2. Mild to moderate bilateral foraminal stenosis at L4-L5 and mild bilateral foraminal stenosis at L3-L4.     Electronically  Signed   By: Feliberto Harts M.D.   On: 02/18/2022 09:09     Objective:  VS:  HT:    WT:   BMI:     BP:(!) 145/78  HR:81bpm  TEMP: ( )  RESP:  Physical Exam Vitals and nursing note reviewed.  Constitutional:      General: He is not in acute distress.    Appearance: Normal appearance. He is not ill-appearing.  HENT:     Head: Normocephalic and atraumatic.     Right Ear: External ear normal.     Left Ear: External ear normal.     Nose: No congestion.  Eyes:     Extraocular Movements: Extraocular movements intact.  Cardiovascular:     Rate and Rhythm: Normal rate.     Pulses: Normal pulses.  Pulmonary:     Effort: Pulmonary effort is normal. No respiratory distress.  Abdominal:     General: There is no distension.     Palpations: Abdomen is soft.  Musculoskeletal:        General: No tenderness or signs of injury.     Cervical back: Neck supple.     Right lower leg: No edema.     Left lower leg: No edema.     Comments: Patient has good distal strength without clonus.  Skin:    Findings: No erythema or rash.  Neurological:     General: No focal deficit present.     Mental Status: He is alert and oriented to person, place, and time.     Sensory: No  sensory deficit.     Motor: No weakness or abnormal muscle tone.     Coordination: Coordination normal.  Psychiatric:        Mood and Affect: Mood normal.        Behavior: Behavior normal.      Imaging: No results found.

## 2023-05-13 ENCOUNTER — Ambulatory Visit: Payer: Self-pay | Admitting: *Deleted

## 2023-05-13 DIAGNOSIS — I10 Essential (primary) hypertension: Secondary | ICD-10-CM

## 2023-05-13 NOTE — Telephone Encounter (Signed)
  Chief Complaint: Occasional dizziness after taking all his medications at once.    Gets dialysis so takes his medicines after dialysis. Symptoms: Dizziness intermittently.   Frequency: occasionally Pertinent Negatives: Patient denies passing out or feeling like he is going to pass out. Disposition: [] ED /[] Urgent Care (no appt availability in office) / [x] Appointment(In office/virtual)/ []  [place] [facility]/ [] Home Care/ [] Refused Recommended Disposition /[] [place] [facility]/ []  Follow-up with PCP Additional Notes: He already has an appt with Bertram Denver, NP.   The pharmacist, Franky Macho at [facility]  and his kidney dr at dialysis also aware of his intermittent dizziness.   "This has been going on for a while".    I added him to the wait list in case of a cancellation. ED precautions given also.    Per chart, during last visit with Franky Macho he is to continue enalapril 5 mg on non dialysis days.   He was started on amlodipine 5 mg on non dialysis days.

## 2023-05-13 NOTE — Telephone Encounter (Signed)
Reason for Disposition  Dizziness is a chronic symptom (recurrent or ongoing AND present > 4 weeks)  Answer Assessment - Initial Assessment Questions 1. DESCRIPTION: "Describe your dizziness."     I'm having dizziness.   When I take all my medicines together I feel dizzy.   It doesn't last long.  She put me on 1/2 a pill for BP.   I've been on that.    2. LIGHTHEADED: "Do you feel lightheaded?" (e.g., somewhat faint, woozy, weak upon standing)     I told Franky Macho about it last time I told him.   I haven't seen him in a while.  Franky Macho the pharmacist) I don't have it all the time just every now and then.   Not feel like passing out when I dizzy.   They check my BP every other day at dialysis.    I know Franky Macho knows about this dizziness.   The kidney dr. At dialysis told me to stop taking the pill.   Then Franky Macho said my BP was high so he put me back on the pill the kidney dr told me to stop.   I'm not sure which one it was.   3. VERTIGO: "Do you feel like either you or the room is spinning or tilting?" (i.e. vertigo)     No 4. SEVERITY: "How bad is it?"  "Do you feel like you are going to faint?" "Can you stand and walk?"   - MILD: Feels slightly dizzy, but walking normally.   - MODERATE: Feels unsteady when walking, but not falling; interferes with normal activities (e.g., school, work).   - SEVERE: Unable to walk without falling, or requires assistance to walk without falling; feels like passing out now.      Mild 5. ONSET:  "When did the dizziness begin?"     It's been going on for a while.   It does not happen every day.  Just on occasion.   My kidney dr. At dialysis is aware of it and so is Immunologist Scientist, research (physical sciences) at [employer]).  I think if I didn't have to take these medicines I would be fine.   I've lost about 30 lbs.   6. AGGRAVATING FACTORS: "Does anything make it worse?" (e.g., standing, change in head position)     It's different when I'm sitting versus standing when they check my  BP at dialysis.  I have dialysis tomorrow (Wed) 7. HEART RATE: "Can you tell me your heart rate?" "How many beats in 15 seconds?"  (Note: not all patients can do this)       Not asked 8. CAUSE: "What do you think is causing the dizziness?"     My BP medicine 9. RECURRENT SYMPTOM: "Have you had dizziness before?" If Yes, ask: "When was the last time?" "What happened that time?"     On occasion after taking all my medicines at once. 10. OTHER SYMPTOMS: "Do you have any other symptoms?" (e.g., fever, chest pain, vomiting, diarrhea, bleeding)       No 11. PREGNANCY: "Is there any chance you are pregnant?" "When was your last menstrual period?"       N/A  Protocols used: Dizziness - Lightheadedness-A-AH

## 2023-05-14 DIAGNOSIS — D509 Iron deficiency anemia, unspecified: Secondary | ICD-10-CM | POA: Diagnosis not present

## 2023-05-14 DIAGNOSIS — N186 End stage renal disease: Secondary | ICD-10-CM | POA: Diagnosis not present

## 2023-05-14 DIAGNOSIS — Z23 Encounter for immunization: Secondary | ICD-10-CM | POA: Diagnosis not present

## 2023-05-14 DIAGNOSIS — D631 Anemia in chronic kidney disease: Secondary | ICD-10-CM | POA: Diagnosis not present

## 2023-05-14 DIAGNOSIS — Z992 Dependence on renal dialysis: Secondary | ICD-10-CM | POA: Diagnosis not present

## 2023-05-14 DIAGNOSIS — N2581 Secondary hyperparathyroidism of renal origin: Secondary | ICD-10-CM | POA: Diagnosis not present

## 2023-05-14 DIAGNOSIS — E1129 Type 2 diabetes mellitus with other diabetic kidney complication: Secondary | ICD-10-CM | POA: Diagnosis not present

## 2023-05-15 ENCOUNTER — Other Ambulatory Visit: Payer: Self-pay

## 2023-05-15 MED ORDER — BLOOD PRESSURE DIGITAL SOLN KIT
1.0000 | PACK | Freq: Every day | 0 refills | Status: DC
  Filled 2023-05-15: qty 1, fill #0

## 2023-05-15 MED ORDER — BLOOD PRESSURE KIT DEVI: 1.0000 | Freq: Every day | 0 refills | Status: DC

## 2023-05-15 NOTE — Telephone Encounter (Addendum)
Patient states he is taking medication as prescribed- instructed to do so per Luke's message.   So this is a chronic issue for him. He sees me for BP and DM. His Nephrologist took him off of amlodipine d/t soft BP on HD days and had him on enalapril only. However, on non-HD days patient becomes hypertensive. When I last saw him in August, we added amlodipine to the enalapril but I instructed him to only take these on days he does NOT have dialysis.  Can we confirm that he is only taking enalapril and amlodipine for BP on non-dialysis days? If he's taking them daily, this is what's causing the dizziness and he needs to be instructed to hold BP meds on HD days. If he is only taking on non-HD days and is still dizzy, we will defer further BP management to the Nephrologist.  He does not wish to have additional office visit with another provider (Nephrologist) at this time.   He does not take blood pressures when these episodes occur. Advised patient that would be helpful.  Last episode was Saturday just before he was going out the house. The episodes last for 15 minutes and then he is fine. He denies syncope or near syncope episodes. He did not deny but would like to get a BP machine sent to pharmacy.   He does not think it is related to whether or not he eats or not because it doesn't happened often. Notices it 2/7 days. With or without meals.  Also, He states he will reach out to he prescribing provider for his seizure medictaion. In the past he has had this and per patient it was d/t dose of Keppra.    Mentioned to patient concerns with anxiety. He did not deny.  He states he has a lot going on in his life.   Patient informed that message will be routed to for follow up.

## 2023-05-15 NOTE — Telephone Encounter (Signed)
Attempt to call patient and relay message from [place]. No answer, unable to leave voicemail.

## 2023-05-15 NOTE — Addendum Note (Signed)
Addended by: Guy Franco on: 05/15/2023 12:33 PM   Modules accepted: Orders

## 2023-05-16 DIAGNOSIS — E1129 Type 2 diabetes mellitus with other diabetic kidney complication: Secondary | ICD-10-CM | POA: Diagnosis not present

## 2023-05-16 DIAGNOSIS — D631 Anemia in chronic kidney disease: Secondary | ICD-10-CM | POA: Diagnosis not present

## 2023-05-16 DIAGNOSIS — Z23 Encounter for immunization: Secondary | ICD-10-CM | POA: Diagnosis not present

## 2023-05-16 DIAGNOSIS — Z992 Dependence on renal dialysis: Secondary | ICD-10-CM | POA: Diagnosis not present

## 2023-05-16 DIAGNOSIS — N186 End stage renal disease: Secondary | ICD-10-CM | POA: Diagnosis not present

## 2023-05-16 DIAGNOSIS — D509 Iron deficiency anemia, unspecified: Secondary | ICD-10-CM | POA: Diagnosis not present

## 2023-05-16 DIAGNOSIS — N2581 Secondary hyperparathyroidism of renal origin: Secondary | ICD-10-CM | POA: Diagnosis not present

## 2023-05-19 DIAGNOSIS — Z992 Dependence on renal dialysis: Secondary | ICD-10-CM | POA: Diagnosis not present

## 2023-05-19 DIAGNOSIS — D631 Anemia in chronic kidney disease: Secondary | ICD-10-CM | POA: Diagnosis not present

## 2023-05-19 DIAGNOSIS — Z23 Encounter for immunization: Secondary | ICD-10-CM | POA: Diagnosis not present

## 2023-05-19 DIAGNOSIS — D509 Iron deficiency anemia, unspecified: Secondary | ICD-10-CM | POA: Diagnosis not present

## 2023-05-19 DIAGNOSIS — N186 End stage renal disease: Secondary | ICD-10-CM | POA: Diagnosis not present

## 2023-05-19 DIAGNOSIS — N2581 Secondary hyperparathyroidism of renal origin: Secondary | ICD-10-CM | POA: Diagnosis not present

## 2023-05-19 DIAGNOSIS — E1129 Type 2 diabetes mellitus with other diabetic kidney complication: Secondary | ICD-10-CM | POA: Diagnosis not present

## 2023-05-21 DIAGNOSIS — Z992 Dependence on renal dialysis: Secondary | ICD-10-CM | POA: Diagnosis not present

## 2023-05-21 DIAGNOSIS — Z23 Encounter for immunization: Secondary | ICD-10-CM | POA: Diagnosis not present

## 2023-05-21 DIAGNOSIS — D509 Iron deficiency anemia, unspecified: Secondary | ICD-10-CM | POA: Diagnosis not present

## 2023-05-21 DIAGNOSIS — E1129 Type 2 diabetes mellitus with other diabetic kidney complication: Secondary | ICD-10-CM | POA: Diagnosis not present

## 2023-05-21 DIAGNOSIS — D631 Anemia in chronic kidney disease: Secondary | ICD-10-CM | POA: Diagnosis not present

## 2023-05-21 DIAGNOSIS — N186 End stage renal disease: Secondary | ICD-10-CM | POA: Diagnosis not present

## 2023-05-21 DIAGNOSIS — N2581 Secondary hyperparathyroidism of renal origin: Secondary | ICD-10-CM | POA: Diagnosis not present

## 2023-05-21 NOTE — Telephone Encounter (Addendum)
Patient was given recommendation per PCP. He verbalized understanding.   He again states he isn't wanting to have another specialist in place at this time. He has not had another episode. He states it's hit or miss. He has not picked up the BP monitor from pharmacy.   Appreciative of the call.  Reminded of f/u apt 06/16/2023.

## 2023-05-22 DIAGNOSIS — I129 Hypertensive chronic kidney disease with stage 1 through stage 4 chronic kidney disease, or unspecified chronic kidney disease: Secondary | ICD-10-CM | POA: Diagnosis not present

## 2023-05-22 DIAGNOSIS — N186 End stage renal disease: Secondary | ICD-10-CM | POA: Diagnosis not present

## 2023-05-22 DIAGNOSIS — R200 Anesthesia of skin: Secondary | ICD-10-CM | POA: Diagnosis not present

## 2023-05-22 DIAGNOSIS — R202 Paresthesia of skin: Secondary | ICD-10-CM | POA: Diagnosis not present

## 2023-05-22 DIAGNOSIS — Z992 Dependence on renal dialysis: Secondary | ICD-10-CM | POA: Diagnosis not present

## 2023-05-23 DIAGNOSIS — N2581 Secondary hyperparathyroidism of renal origin: Secondary | ICD-10-CM | POA: Diagnosis not present

## 2023-05-23 DIAGNOSIS — N186 End stage renal disease: Secondary | ICD-10-CM | POA: Diagnosis not present

## 2023-05-23 DIAGNOSIS — E1129 Type 2 diabetes mellitus with other diabetic kidney complication: Secondary | ICD-10-CM | POA: Diagnosis not present

## 2023-05-23 DIAGNOSIS — D509 Iron deficiency anemia, unspecified: Secondary | ICD-10-CM | POA: Diagnosis not present

## 2023-05-23 DIAGNOSIS — Z992 Dependence on renal dialysis: Secondary | ICD-10-CM | POA: Diagnosis not present

## 2023-05-26 ENCOUNTER — Telehealth: Payer: Self-pay | Admitting: Physical Medicine and Rehabilitation

## 2023-05-26 DIAGNOSIS — Z992 Dependence on renal dialysis: Secondary | ICD-10-CM | POA: Diagnosis not present

## 2023-05-26 DIAGNOSIS — N186 End stage renal disease: Secondary | ICD-10-CM | POA: Diagnosis not present

## 2023-05-26 DIAGNOSIS — N2581 Secondary hyperparathyroidism of renal origin: Secondary | ICD-10-CM | POA: Diagnosis not present

## 2023-05-26 DIAGNOSIS — D509 Iron deficiency anemia, unspecified: Secondary | ICD-10-CM | POA: Diagnosis not present

## 2023-05-26 DIAGNOSIS — E1129 Type 2 diabetes mellitus with other diabetic kidney complication: Secondary | ICD-10-CM | POA: Diagnosis not present

## 2023-05-28 ENCOUNTER — Ambulatory Visit: Payer: Medicare Other | Admitting: Physical Medicine and Rehabilitation

## 2023-05-28 DIAGNOSIS — E1129 Type 2 diabetes mellitus with other diabetic kidney complication: Secondary | ICD-10-CM | POA: Diagnosis not present

## 2023-05-28 DIAGNOSIS — Z992 Dependence on renal dialysis: Secondary | ICD-10-CM | POA: Diagnosis not present

## 2023-05-28 DIAGNOSIS — N2581 Secondary hyperparathyroidism of renal origin: Secondary | ICD-10-CM | POA: Diagnosis not present

## 2023-05-28 DIAGNOSIS — N186 End stage renal disease: Secondary | ICD-10-CM | POA: Diagnosis not present

## 2023-05-28 DIAGNOSIS — D509 Iron deficiency anemia, unspecified: Secondary | ICD-10-CM | POA: Diagnosis not present

## 2023-05-30 DIAGNOSIS — E1129 Type 2 diabetes mellitus with other diabetic kidney complication: Secondary | ICD-10-CM | POA: Diagnosis not present

## 2023-05-30 DIAGNOSIS — D509 Iron deficiency anemia, unspecified: Secondary | ICD-10-CM | POA: Diagnosis not present

## 2023-05-30 DIAGNOSIS — N2581 Secondary hyperparathyroidism of renal origin: Secondary | ICD-10-CM | POA: Diagnosis not present

## 2023-05-30 DIAGNOSIS — Z992 Dependence on renal dialysis: Secondary | ICD-10-CM | POA: Diagnosis not present

## 2023-05-30 DIAGNOSIS — N186 End stage renal disease: Secondary | ICD-10-CM | POA: Diagnosis not present

## 2023-06-02 DIAGNOSIS — N2581 Secondary hyperparathyroidism of renal origin: Secondary | ICD-10-CM | POA: Diagnosis not present

## 2023-06-02 DIAGNOSIS — N186 End stage renal disease: Secondary | ICD-10-CM | POA: Diagnosis not present

## 2023-06-02 DIAGNOSIS — E1129 Type 2 diabetes mellitus with other diabetic kidney complication: Secondary | ICD-10-CM | POA: Diagnosis not present

## 2023-06-02 DIAGNOSIS — Z992 Dependence on renal dialysis: Secondary | ICD-10-CM | POA: Diagnosis not present

## 2023-06-02 DIAGNOSIS — D509 Iron deficiency anemia, unspecified: Secondary | ICD-10-CM | POA: Diagnosis not present

## 2023-06-04 ENCOUNTER — Ambulatory Visit: Payer: Medicare Other | Admitting: Physical Medicine and Rehabilitation

## 2023-06-04 DIAGNOSIS — N186 End stage renal disease: Secondary | ICD-10-CM | POA: Diagnosis not present

## 2023-06-04 DIAGNOSIS — E1129 Type 2 diabetes mellitus with other diabetic kidney complication: Secondary | ICD-10-CM | POA: Diagnosis not present

## 2023-06-04 DIAGNOSIS — D509 Iron deficiency anemia, unspecified: Secondary | ICD-10-CM | POA: Diagnosis not present

## 2023-06-04 DIAGNOSIS — N2581 Secondary hyperparathyroidism of renal origin: Secondary | ICD-10-CM | POA: Diagnosis not present

## 2023-06-04 DIAGNOSIS — Z992 Dependence on renal dialysis: Secondary | ICD-10-CM | POA: Diagnosis not present

## 2023-06-06 DIAGNOSIS — N186 End stage renal disease: Secondary | ICD-10-CM | POA: Diagnosis not present

## 2023-06-06 DIAGNOSIS — Z992 Dependence on renal dialysis: Secondary | ICD-10-CM | POA: Diagnosis not present

## 2023-06-06 DIAGNOSIS — N2581 Secondary hyperparathyroidism of renal origin: Secondary | ICD-10-CM | POA: Diagnosis not present

## 2023-06-06 DIAGNOSIS — E1129 Type 2 diabetes mellitus with other diabetic kidney complication: Secondary | ICD-10-CM | POA: Diagnosis not present

## 2023-06-06 DIAGNOSIS — D509 Iron deficiency anemia, unspecified: Secondary | ICD-10-CM | POA: Diagnosis not present

## 2023-06-09 DIAGNOSIS — N2581 Secondary hyperparathyroidism of renal origin: Secondary | ICD-10-CM | POA: Diagnosis not present

## 2023-06-09 DIAGNOSIS — Z992 Dependence on renal dialysis: Secondary | ICD-10-CM | POA: Diagnosis not present

## 2023-06-09 DIAGNOSIS — E1129 Type 2 diabetes mellitus with other diabetic kidney complication: Secondary | ICD-10-CM | POA: Diagnosis not present

## 2023-06-09 DIAGNOSIS — D509 Iron deficiency anemia, unspecified: Secondary | ICD-10-CM | POA: Diagnosis not present

## 2023-06-09 DIAGNOSIS — N186 End stage renal disease: Secondary | ICD-10-CM | POA: Diagnosis not present

## 2023-06-11 DIAGNOSIS — Z992 Dependence on renal dialysis: Secondary | ICD-10-CM | POA: Diagnosis not present

## 2023-06-11 DIAGNOSIS — N186 End stage renal disease: Secondary | ICD-10-CM | POA: Diagnosis not present

## 2023-06-11 DIAGNOSIS — E1129 Type 2 diabetes mellitus with other diabetic kidney complication: Secondary | ICD-10-CM | POA: Diagnosis not present

## 2023-06-11 DIAGNOSIS — D509 Iron deficiency anemia, unspecified: Secondary | ICD-10-CM | POA: Diagnosis not present

## 2023-06-11 DIAGNOSIS — N2581 Secondary hyperparathyroidism of renal origin: Secondary | ICD-10-CM | POA: Diagnosis not present

## 2023-06-13 DIAGNOSIS — Z992 Dependence on renal dialysis: Secondary | ICD-10-CM | POA: Diagnosis not present

## 2023-06-13 DIAGNOSIS — D509 Iron deficiency anemia, unspecified: Secondary | ICD-10-CM | POA: Diagnosis not present

## 2023-06-13 DIAGNOSIS — N186 End stage renal disease: Secondary | ICD-10-CM | POA: Diagnosis not present

## 2023-06-13 DIAGNOSIS — N2581 Secondary hyperparathyroidism of renal origin: Secondary | ICD-10-CM | POA: Diagnosis not present

## 2023-06-13 DIAGNOSIS — E1129 Type 2 diabetes mellitus with other diabetic kidney complication: Secondary | ICD-10-CM | POA: Diagnosis not present

## 2023-06-16 ENCOUNTER — Ambulatory Visit: Payer: Medicare Other | Attending: Nurse Practitioner | Admitting: Nurse Practitioner

## 2023-06-16 ENCOUNTER — Encounter: Payer: Self-pay | Admitting: Nurse Practitioner

## 2023-06-16 ENCOUNTER — Other Ambulatory Visit: Payer: Self-pay

## 2023-06-16 VITALS — BP 125/65 | HR 77 | Ht 68.0 in | Wt 277.8 lb

## 2023-06-16 DIAGNOSIS — Z139 Encounter for screening, unspecified: Secondary | ICD-10-CM

## 2023-06-16 DIAGNOSIS — I10 Essential (primary) hypertension: Secondary | ICD-10-CM | POA: Diagnosis not present

## 2023-06-16 DIAGNOSIS — D688 Other specified coagulation defects: Secondary | ICD-10-CM

## 2023-06-16 DIAGNOSIS — M5416 Radiculopathy, lumbar region: Secondary | ICD-10-CM

## 2023-06-16 DIAGNOSIS — Z1211 Encounter for screening for malignant neoplasm of colon: Secondary | ICD-10-CM | POA: Diagnosis not present

## 2023-06-16 MED ORDER — ASPIRIN 81 MG PO TBEC
81.0000 mg | DELAYED_RELEASE_TABLET | Freq: Every day | ORAL | 0 refills | Status: DC
Start: 2023-06-16 — End: 2023-08-26

## 2023-06-16 NOTE — Progress Notes (Signed)
Assessment & Plan:  Jeremy [name] was seen today for medical management of chronic issues.  Diagnoses and all orders for this visit:  Essential hypertension Continue all antihypertensives as prescribed.  Reminded to bring in blood pressure log for follow  up appointment.  RECOMMENDATIONS: DASH/Mediterranean Diets are healthier choices for HTN.    Colon cancer screening -     Ambulatory referral to Gastroenterology  Other specified coagulation defects (HCC) -     aspirin EC 81 MG tablet; Take 1 tablet (81 mg total) by mouth daily.  Lumbar radiculopathy -     Ambulatory referral to Orthopedic Surgery    Patient has been counseled on age-appropriate routine health concerns for screening and prevention. These are reviewed and up-to-date. Referrals have been placed accordingly. Immunizations are up-to-date or declined.    Subjective:   Chief Complaint  Patient presents with   Medical Management of Chronic Issues    Jeremy [name]. 57 y.o. [name] presents to office today for follow up to HTN  He has a past medical history of Abscess, AKI (08/05/2017), Bell's palsy, CKD , COVID (2020), Dyspnea, ESRD with HD MWF , Headache, High cholesterol, Hypertension, Left shoulder pain (10/26/2013), Renal insufficiency (02/14/2014), Seizures (HCC) (08/04/2017), and Type II diabetes mellitus (HCC).    He is requesting a second opinion for his lumbar radiculopathy. Wants to go to any orthopedist with [facility].  HTN Blood pressure is well controlled.  BP Readings from Last 3 Encounters:  06/16/23 125/65  04/29/23 (!) 145/78  03/13/23 (!) 173/78    Back Pain PER ORTHO 04-15-2023 Jeremy [name] is a pleasant 57 year old gentleman with a long history of low back pain. He is a patient of Jeremy [name] and Jeremy [name]. He was having recurrent issues he did well with the injection he received from Jeremy [name]. He did not want to wait so he was referred to [facility] imaging. In August he had an  interlaminar L4 5 injection on the left. He presents today saying it did not help at all. Has moderate to severe pain denies any loss of bowel or bladder control   PER PMR 04-16-2023 HPI: Jeremy [name]. is a 57 y.o. [name] who comes in today for evaluation of chronic, worsening and severe bilateral lower back pain radiating to lateral hips, buttocks and down legs, left greater than right. Pain ongoing for several years, worsens with prolonged standing and walking. He describes pain as sore, aching and numbness sensation, currently rats as 8 out of 10.  Some relief of pain with home exercise regimen, rest and use of medications. Sitting seems to alleviate his pain significantly. Takes Norco intermittently for severe pain, also uses Biofreeze as needed. Lumbar MRI imaging from 2023 exhibits prominent epidural fat resulting in moderate to severe canal stenosis at L3-L4 L4-L5 and moderate canal stenosis at L5-S1. He underwent right L4-L5 interlaminar epidural steroid injection in our office on 03/20/2022, he reports greater than 50% relief of pain for over 6 months with this procedure. More recently he underwent left L4-L5 interlaminar epidural steroid injection with [facility] on 01/30/2023 and 03/13/2023, he reports no relief of pain with these procedures. Patient denies focal weakness, numbness and tingling. No recent trauma or falls.   Chronic, worsening and severe bilateral lower back pain radiating to lateral hips, buttocks and down legs, left greater than right. Patient continues to have severe pain despite good conservative therapies such as home exercise regimen, rest and use of medications. Patients clinical presentation and  exam are consistent with neurogenic claudication as a result of spinal canal stenosis. There is moderate to severe spinal canal stenosis due to prominent epidural fat at L3-L4 and L4-L5, moderate at L5-S1. He received significant long lasting relief with prior injection in our office in  2023. More recently, injections from [facility] have were not beneficial in alleviating his pain. We discussed treatment plan in detail, next step is to perform bilateral L4 transforminal epidural steroid injection. We have yet to try transforaminal approach and feel this could be more helpful in alleviating his pain. I also feel patient would benefit from referral to our spine surgeon Jeremy [name] to discuss options.   Review of Systems  Constitutional:  Negative for fever, malaise/fatigue and weight loss.  HENT: Negative.  Negative for nosebleeds.   Eyes: Negative.  Negative for blurred vision, double vision and photophobia.  Respiratory: Negative.  Negative for cough and shortness of breath.   Cardiovascular: Negative.  Negative for chest pain, palpitations and leg swelling.  Gastrointestinal: Negative.  Negative for heartburn, nausea and vomiting.  Musculoskeletal:  Positive for back pain and joint pain. Negative for myalgias and neck pain.  Neurological: Negative.  Negative for dizziness, focal weakness, seizures and headaches.  Psychiatric/Behavioral: Negative.  Negative for suicidal ideas.     Past Medical History:  Diagnosis Date   Abscess    AKI (acute kidney injury) (HCC) 08/05/2017   Bell's palsy    right eye abn since dx bell's palsy   CKD (chronic kidney disease)    Dr. McDiarmind    COVID 2020   mild   Dyspnea    occasional with exertion   ESRD (end stage renal disease) (HCC)    MWF -[facility]   Headache    migraines   High cholesterol    Hypertension    Left shoulder pain 10/26/2013   S/p injection on 10/26/13    Renal insufficiency 02/14/2014   Seizures (HCC) 08/04/2017   "related to high blood sugars" (08/05/2017)   Type II diabetes mellitus (HCC)     Past Surgical History:  Procedure Laterality Date   A/V FISTULAGRAM Left 07/25/2022   Procedure: A/V Fistulagram;  Surgeon: Cephus Shelling, MD;  Location: [facility];  Service: Cardiovascular;   Laterality: Left;   AV FISTULA PLACEMENT Left 11/11/2019   Procedure: LEFT FOREARM RADIOCEPHALIC ARTERIOVENOUS (AV) FISTULA CREATION;  Surgeon: Cephus Shelling, MD;  Location: [facility] OR;  Service: Vascular;  Laterality: Left;   AV FISTULA PLACEMENT Left 08/14/2022   Procedure: FIRST STAGE BRACHIOBASILIC LEFT ARM ARTERIOVENOUS (AV) FISTULA CREATION;  Surgeon: Cephus Shelling, MD;  Location: [facility] OR;  Service: Vascular;  Laterality: Left;   BASCILIC VEIN TRANSPOSITION Left 10/30/2022   Procedure: LEFT ARM SECOND STAGE BASILIC VEIN TRANSPOSITION;  Surgeon: Cephus Shelling, MD;  Location: [facility] OR;  Service: Vascular;  Laterality: Left;   Eyelid surgery Right    I & D EXTREMITY  11/11/2011   Procedure: IRRIGATION AND DEBRIDEMENT EXTREMITY;  Surgeon: Robyne Askew, MD;  Location: [facility] OR;  Service: General;  Laterality: Right;  I&D Right Thigh Abscess   INSERTION OF DIALYSIS CATHETER N/A 07/25/2022   Procedure: INSERTION OF DIALYSIS CATHETER;  Surgeon: Cephus Shelling, MD;  Location: [facility] OR;  Service: Vascular;  Laterality: N/A;   IR FLUORO GUIDE CV LINE RIGHT  08/12/2019   IR US GUIDE VASC ACCESS RIGHT  08/12/2019   LIGATION OF ARTERIOVENOUS  FISTULA Left 08/14/2022   Procedure: LIGATION  OF BRACHIOCEPHALIC ARTERIOVENOUS  FISTULA;  Surgeon: Cephus Shelling, MD;  Location[facility] OR;  Service: Vascular;  Laterality: Left;   REVISON OF ARTERIOVENOUS FISTULA Left 03/23/2020   Procedure: LEFT ARM ARTERIOVENOUS FISTULA REVISON, LIGATION OF SIDE BRANCHES AND ELEVATION;  Surgeon: Cephus Shelling, MD;  Location: [facility] OR;  Service: Vascular;  Laterality: Left;    Family History  Problem Relation Age of Onset   Diabetes Mother    Heart disease Mother 50   Diabetes Sister    Diabetes Brother    Diabetes Brother    Cancer Maternal Uncle        type unknown   Seizures Niece    Stroke Neg Hx     Social History Reviewed with no changes to be made today.   Outpatient Medications Prior to Visit   Medication Sig Dispense Refill   amLODipine (NORVASC) 5 MG tablet Take 1 tablet by mouth daily on non-dialysis days (Tues, Thursday, Sat, Sun). Do not take on MWF. (Patient taking differently: Take 2.5 mg by mouth every Tuesday, Thursday, Saturday, and Sunday.) 16 tablet 2   atorvastatin (LIPITOR) 40 MG tablet Take 1 tablet (40 mg total) by mouth daily. 90 tablet 3   Blood Glucose Monitoring Suppl (CONTOUR NEXT EZ) w/Device KIT Use to check blood sugar three times daily. 1 kit 0   Blood Pressure Monitoring (BLOOD PRESSURE DIGITAL SOLN) KIT Use to check blood pressure daily. 1 kit 0   diclofenac Sodium (VOLTAREN) 1 % GEL Apply 4 g topically 4 (four) times daily. 200 g 1   Doxercalciferol (HECTOROL IV) Doxercalciferol (Hectorol)     enalapril (VASOTEC) 5 MG tablet Take 1 tablet (5 mg total) by mouth daily. 90 tablet 1   gabapentin (NEURONTIN) 100 MG capsule Take 1 capsule (100 mg total) by mouth 3 (three) times a week. Take on HD days after HD (hemodialysis) and in the evenings. 90 capsule 3   glucose blood (CONTOUR NEXT TEST) test strip Use to check blood sugar three times daily. 100 each 12   Lancet Devices (MICROLET NEXT LANCING DEVICE) MISC 1 Device by Does not apply route 3 (three) times daily. 1 each 11   LASIX 80 MG tablet Take by mouth.     levETIRAcetam (KEPPRA XR) 500 MG 24 hr tablet Take 1 tablet (500 mg total) by mouth in the morning and at bedtime. 180 tablet 3   Methoxy PEG-Epoetin Beta (MIRCERA IJ) Mircera     Microlet Lancets MISC Use to check blood sugar three times daily. E11.65 100 each 3   SENSIPAR 30 MG tablet Take 60 mg by mouth 3 (three) times a week.     Tenapanor HCl, CKD, (XPHOZAH) 30 MG TABS Take by mouth.     aspirin EC 81 MG tablet TAKE 1 TABLET BY MOUTH EVERY DAY 90 tablet 0   Insulin Lispro Prot & Lispro (HUMALOG MIX 75/25 KWIKPEN) (75-25) 100 UNIT/ML Kwikpen Inject into the skin. (Patient not taking: Reported on 06/16/2023)     Semaglutide,0.25 or 0.5MG /DOS,  (OZEMPIC, 0.25 OR 0.5 MG/DOSE,) 2 MG/3ML SOPN Inject into the skin. (Patient not taking: Reported on 06/16/2023)     VELPHORO 500 MG chewable tablet Chew 500 mg by mouth 3 (three) times daily with meals. (Patient not taking: Reported on 06/16/2023)     insulin glargine (LANTUS SOLOSTAR) 100 UNIT/ML Solostar Pen Inject 50 Units into the skin daily. (Patient not taking: Reported on 06/16/2023) 45 mL 1   Insulin Pen Needle (B-D ULTRAFINE III  SHORT PEN) 31G X 8 MM MISC 1 application  by Does not apply route 3 (three) times daily. E11.65 (Patient not taking: Reported on 06/16/2023) 200 each 11   Facility-Administered Medications Prior to Visit  Medication Dose Route Frequency Provider Last Rate Last Admin   0.9 %  sodium chloride infusion  250 mL Intravenous PRN Cephus Shelling, MD       sodium chloride flush (NS) 0.9 % injection 3 mL  3 mL Intravenous Q12H Cephus Shelling, MD        Allergies  Allergen Reactions   Amlodipine Other (See Comments)    Dizziness       Objective:    BP 125/65 (BP Location: Left Arm, Patient Position: Sitting, Cuff Size: Large)   Pulse 77   Ht 5\' 8"  (1.727 m)   Wt 277 lb 12.8 oz (126 kg) Comment: [id number] - patient reported  SpO2 96%   BMI 42.24 kg/m  Wt Readings from Last 3 Encounters:  06/16/23 277 lb 12.8 oz (126 kg)  03/12/23 283 lb 1.1 oz (128.4 kg)  03/04/23 283 lb (128.4 kg)    Physical Exam Vitals and nursing note reviewed.  Constitutional:      Appearance: He is well-developed.  HENT:     Head: Normocephalic and atraumatic.  Cardiovascular:     Rate and Rhythm: Normal rate and regular rhythm.     Heart sounds: Normal heart sounds. No murmur heard.    No friction rub. No gallop.  Pulmonary:     Effort: Pulmonary effort is normal. No tachypnea or respiratory distress.     Breath sounds: Normal breath sounds. No decreased breath sounds, wheezing, rhonchi or rales.  Chest:     Chest wall: No tenderness.  Abdominal:     General:  Bowel sounds are normal.     Palpations: Abdomen is soft.  Musculoskeletal:        General: Normal range of motion.     Cervical back: Normal range of motion.  Skin:    General: Skin is warm and dry.  Neurological:     Mental Status: He is alert and oriented to person, place, and time.     Coordination: Coordination normal.  Psychiatric:        Behavior: Behavior normal. Behavior is cooperative.        Thought Content: Thought content normal.        Judgment: Judgment normal.          Patient has been counseled extensively about nutrition and exercise as well as the importance of adherence with medications and regular follow-up. The patient was given clear instructions to go to ER or return to medical center if symptoms don't improve, worsen or new problems develop. The patient verbalized understanding.   Follow-up: Return in about 3 months (around 09/16/2023).   Claiborne Rigg, FNP-BC [facility] [place], [place] [phone]   06/16/2023, 3:49 PM

## 2023-06-16 NOTE — Patient Instructions (Addendum)
[facility] [address] [place], [place] [address] ?PH# [phone] ?

## 2023-06-16 NOTE — Addendum Note (Signed)
Addended by: Arbie Cookey on: 06/16/2023 04:59 PM   Modules accepted: Orders

## 2023-06-17 ENCOUNTER — Telehealth: Payer: Self-pay | Admitting: *Deleted

## 2023-06-17 ENCOUNTER — Telehealth: Payer: Self-pay | Admitting: Nurse Practitioner

## 2023-06-17 DIAGNOSIS — D509 Iron deficiency anemia, unspecified: Secondary | ICD-10-CM | POA: Diagnosis not present

## 2023-06-17 DIAGNOSIS — N2581 Secondary hyperparathyroidism of renal origin: Secondary | ICD-10-CM | POA: Diagnosis not present

## 2023-06-17 DIAGNOSIS — E1129 Type 2 diabetes mellitus with other diabetic kidney complication: Secondary | ICD-10-CM | POA: Diagnosis not present

## 2023-06-17 DIAGNOSIS — Z992 Dependence on renal dialysis: Secondary | ICD-10-CM | POA: Diagnosis not present

## 2023-06-17 DIAGNOSIS — N186 End stage renal disease: Secondary | ICD-10-CM | POA: Diagnosis not present

## 2023-06-17 NOTE — Telephone Encounter (Signed)
Pt is calling in because he is returning a call from [place]. Please follow up with pt.

## 2023-06-17 NOTE — Progress Notes (Signed)
  Care Coordination  Outreach Note  06/17/2023 Name: Jeremy [name]. MRN: [id number] DOB: October 28, 1965   Care Coordination Outreach Attempts: An unsuccessful telephone outreach was attempted today to offer the patient information about available care coordination services.  Follow Up Plan:  Additional outreach attempts will be made to offer the patient care coordination information and services.   Encounter Outcome:  No Answer  Gwenevere Ghazi  Care Coordination Care Guide  Direct Dial: [phone]

## 2023-06-17 NOTE — Telephone Encounter (Signed)
Jeremy [name]? Unable to reach, no voicemail.

## 2023-06-20 NOTE — Progress Notes (Signed)
  Care Coordination  Outreach Note  06/20/2023 Name: Jeremy [name]. MRN: [id number] DOB: 10-21-1965   Care Coordination Outreach Attempts: A second unsuccessful outreach was attempted today to offer the patient with information about available care coordination services.  Follow Up Plan:  Additional outreach attempts will be made to offer the patient care coordination information and services.   Encounter Outcome:  Patient Request to Call Back   [facility] Coordination Care Guide  Direct Dial: [phone]

## 2023-06-21 DIAGNOSIS — N186 End stage renal disease: Secondary | ICD-10-CM | POA: Diagnosis not present

## 2023-06-21 DIAGNOSIS — Z992 Dependence on renal dialysis: Secondary | ICD-10-CM | POA: Diagnosis not present

## 2023-06-21 DIAGNOSIS — I129 Hypertensive chronic kidney disease with stage 1 through stage 4 chronic kidney disease, or unspecified chronic kidney disease: Secondary | ICD-10-CM | POA: Diagnosis not present

## 2023-06-23 ENCOUNTER — Encounter (INDEPENDENT_AMBULATORY_CARE_PROVIDER_SITE_OTHER): Payer: Medicare Other | Admitting: Podiatry

## 2023-06-23 DIAGNOSIS — N186 End stage renal disease: Secondary | ICD-10-CM | POA: Diagnosis not present

## 2023-06-23 DIAGNOSIS — D509 Iron deficiency anemia, unspecified: Secondary | ICD-10-CM | POA: Diagnosis not present

## 2023-06-23 DIAGNOSIS — R739 Hyperglycemia, unspecified: Secondary | ICD-10-CM | POA: Diagnosis not present

## 2023-06-23 DIAGNOSIS — N2581 Secondary hyperparathyroidism of renal origin: Secondary | ICD-10-CM | POA: Diagnosis not present

## 2023-06-23 DIAGNOSIS — Z91199 Patient's noncompliance with other medical treatment and regimen due to unspecified reason: Secondary | ICD-10-CM

## 2023-06-23 DIAGNOSIS — Z992 Dependence on renal dialysis: Secondary | ICD-10-CM | POA: Diagnosis not present

## 2023-06-23 DIAGNOSIS — E1129 Type 2 diabetes mellitus with other diabetic kidney complication: Secondary | ICD-10-CM | POA: Diagnosis not present

## 2023-06-23 NOTE — Progress Notes (Signed)
Patient was a no-show for today's scheduled appointment.

## 2023-06-24 ENCOUNTER — Ambulatory Visit: Payer: Medicare Other | Attending: Nurse Practitioner | Admitting: Pharmacist

## 2023-06-24 ENCOUNTER — Encounter: Payer: Self-pay | Admitting: Pharmacist

## 2023-06-24 VITALS — BP 134/73 | HR 77

## 2023-06-24 DIAGNOSIS — E1151 Type 2 diabetes mellitus with diabetic peripheral angiopathy without gangrene: Secondary | ICD-10-CM

## 2023-06-24 LAB — POCT GLYCOSYLATED HEMOGLOBIN (HGB A1C): HbA1c, POC (controlled diabetic range): 5.6 % (ref 0.0–7.0)

## 2023-06-24 NOTE — Progress Notes (Signed)
S:     No chief complaint on file.  57 y.o. male who presents for diabetes evaluation, education, and management.  PMH is significant for T2DM w/ ESRD (MWF HD), anemia of chronic disease, resistant HTN, OSA, HLD, obesity, epilepsy. Patient was originally referred by Zelda on 06/16/2023. BP at that visit was 125/65 mmHg. Last A1c was controlled at 6.8%.  I've been involved in his care since 06/2022 and last saw him in 03/11/23. I did not make any DM medication changes. Regarding his HTN, his BP fluctuates d/t HD. For this reason, it is difficult to adjust his antihypertensives.Today, patient arrives in good spirits and presents without any assistance. He has no complaints. He is checking blood glucose via finger stick and prefers not to use libre at this time. He brings his meter for review. A1c today is 5.6%!   For his BP, Jeremy [name] tells me today that his blood pressure has been better but continues to fluctuate. On non-dialysis days, he takes his medication but he does not take on dialysis days. He had a syncopal/hypotensive episode yesterday at dialysis but attributes this to going without HD for ~4 days d/t a recent trip and then having more fluid pulled off at HD yesterday. He took his antihypertensives this morning.   Family/Social History:  -Fhx: DM, heart disease, seizure disorder, stroke -Tobacco: never smoker  -Alcohol: none reported   Current diabetes medications include: none Current hypertension medications include: amlodipine 5mg  daily, enalapril 5 mg (only taking on non-dialysis days)  Insurance coverage: BCBS Medicare, Family [employer] Medicaid  Patient denies nocturia (nighttime urination).  Patient reports neuropathy (nerve pain). Patient denies visual changes. Patient reports self foot exams.   Patient reported no changes from previous dietary habits:  -Adherent to sodium restriction. -Has stopped drinking regular Pepsi and Mt. Dew daily.  -Limits carbohydrates  and tries to avoid sweets.  Patient-reported exercise habits: walking and yard work  O:   Lab Results  Component Value Date   HGBA1C 5.6 06/24/2023   Vitals:   06/24/23 1053  BP: 134/73  Pulse: 77    Lipid Panel     Component Value Date/Time   CHOL 261 (H) 02/04/2022 1129   TRIG 222 (H) 02/04/2022 1129   HDL 54 02/04/2022 1129   CHOLHDL 4.8 02/04/2022 1129   CHOLHDL 3.1 02/09/2016 1045   VLDL 11 02/09/2016 1045   LDLCALC 166 (H) 02/04/2022 1129   Clinical Atherosclerotic Cardiovascular Disease (ASCVD): No  The 10-year ASCVD risk score (Arnett DK, et al., 2019) is: 23.5%   Values used to calculate the score:     Age: 57 years     Sex: Male     Is Non-Hispanic African American: Yes     Diabetic: Yes     Tobacco smoker: No     Systolic Blood Pressure: 134 mmHg     Is BP treated: Yes     HDL Cholesterol: 54 mg/dL     Total Cholesterol: 261 mg/dL   A/P: Diabetes longstanding currently at goal. Home sugars are at goal. Patient is able to verbalize appropriate hypoglycemia management plan but is not having hypoglycemia at this time. He has not taken insulin in at least the last several months. -Continue management with lifestyle alone. -Extensively discussed pathophysiology of diabetes, recommended lifestyle interventions, dietary effects on blood sugar control.  -Counseled on s/sx of and management of hypoglycemia.  -Next A1c anticipated 09/2023.  Hypertension longstanding currently at goal. Blood pressure goal of <130/80  mmHg. Patient took amlodipine and enalapril today.  -Continue enalapril 5 mg daily on non-HD -Continue amlodipine 5 mg daily on non-HD days   Written patient instructions provided. Patient verbalized understanding of treatment plan.  Total time in face to face counseling 20 minutes.    Follow-up:  Pharmacist prn. PCP clinic visit 09/16/2023.  Jeremy [name], PharmD, [name], CPP Clinical Pharmacist [facility] [phone]

## 2023-06-25 DIAGNOSIS — R739 Hyperglycemia, unspecified: Secondary | ICD-10-CM | POA: Diagnosis not present

## 2023-06-25 DIAGNOSIS — Z992 Dependence on renal dialysis: Secondary | ICD-10-CM | POA: Diagnosis not present

## 2023-06-25 DIAGNOSIS — E1129 Type 2 diabetes mellitus with other diabetic kidney complication: Secondary | ICD-10-CM | POA: Diagnosis not present

## 2023-06-25 DIAGNOSIS — D509 Iron deficiency anemia, unspecified: Secondary | ICD-10-CM | POA: Diagnosis not present

## 2023-06-25 DIAGNOSIS — N186 End stage renal disease: Secondary | ICD-10-CM | POA: Diagnosis not present

## 2023-06-25 DIAGNOSIS — N2581 Secondary hyperparathyroidism of renal origin: Secondary | ICD-10-CM | POA: Diagnosis not present

## 2023-06-28 DIAGNOSIS — R739 Hyperglycemia, unspecified: Secondary | ICD-10-CM | POA: Diagnosis not present

## 2023-06-28 DIAGNOSIS — N186 End stage renal disease: Secondary | ICD-10-CM | POA: Diagnosis not present

## 2023-06-28 DIAGNOSIS — E1129 Type 2 diabetes mellitus with other diabetic kidney complication: Secondary | ICD-10-CM | POA: Diagnosis not present

## 2023-06-28 DIAGNOSIS — Z992 Dependence on renal dialysis: Secondary | ICD-10-CM | POA: Diagnosis not present

## 2023-06-28 DIAGNOSIS — N2581 Secondary hyperparathyroidism of renal origin: Secondary | ICD-10-CM | POA: Diagnosis not present

## 2023-06-28 DIAGNOSIS — D509 Iron deficiency anemia, unspecified: Secondary | ICD-10-CM | POA: Diagnosis not present

## 2023-06-30 DIAGNOSIS — R739 Hyperglycemia, unspecified: Secondary | ICD-10-CM | POA: Diagnosis not present

## 2023-06-30 DIAGNOSIS — D509 Iron deficiency anemia, unspecified: Secondary | ICD-10-CM | POA: Diagnosis not present

## 2023-06-30 DIAGNOSIS — Z992 Dependence on renal dialysis: Secondary | ICD-10-CM | POA: Diagnosis not present

## 2023-06-30 DIAGNOSIS — N2581 Secondary hyperparathyroidism of renal origin: Secondary | ICD-10-CM | POA: Diagnosis not present

## 2023-06-30 DIAGNOSIS — E1129 Type 2 diabetes mellitus with other diabetic kidney complication: Secondary | ICD-10-CM | POA: Diagnosis not present

## 2023-06-30 DIAGNOSIS — N186 End stage renal disease: Secondary | ICD-10-CM | POA: Diagnosis not present

## 2023-06-30 NOTE — Progress Notes (Signed)
  Care Coordination  Outreach Note  06/30/2023 Name: Jeremy [name]. MRN: [id number] DOB: September 27, 1965   Care Coordination Outreach Attempts: A third unsuccessful outreach was attempted today to offer the patient with information about available care coordination services.  Follow Up Plan:  No further outreach attempts will be made at this time. We have been unable to contact the patient to offer or enroll patient in care coordination services  Encounter Outcome:  No Answer  Gwenevere Ghazi  Care Coordination Care Guide  Direct Dial: [phone]

## 2023-06-30 NOTE — Progress Notes (Signed)
  Care Coordination   Note   06/30/2023 Name: Jeremy [name]. MRN: [id number] DOB: 05-10-1966  Jeremy [name]. is a 57 y.o. year old male who sees Claiborne Rigg, NP for primary care. I reached out to Jeremy [name]. by phone today to offer care coordination services.  Jeremy [name] was given information about Care Coordination services today including:   The Care Coordination services include support from the care team which includes your Nurse Coordinator, Clinical Social Worker, or Pharmacist.  The Care Coordination team is here to help remove barriers to the health concerns and goals most important to you. Care Coordination services are voluntary, and the patient may decline or stop services at any time by request to their care team member.   Care Coordination Consent Status: Patient agreed to services and verbal consent obtained.   Follow up plan:  Telephone appointment with care coordination team member scheduled for:  12/17  Encounter Outcome:  Patient Scheduled  [facility] Coordination Care Guide  Direct Dial: [phone]

## 2023-07-02 DIAGNOSIS — D509 Iron deficiency anemia, unspecified: Secondary | ICD-10-CM | POA: Diagnosis not present

## 2023-07-02 DIAGNOSIS — N186 End stage renal disease: Secondary | ICD-10-CM | POA: Diagnosis not present

## 2023-07-02 DIAGNOSIS — Z992 Dependence on renal dialysis: Secondary | ICD-10-CM | POA: Diagnosis not present

## 2023-07-02 DIAGNOSIS — E1129 Type 2 diabetes mellitus with other diabetic kidney complication: Secondary | ICD-10-CM | POA: Diagnosis not present

## 2023-07-02 DIAGNOSIS — N2581 Secondary hyperparathyroidism of renal origin: Secondary | ICD-10-CM | POA: Diagnosis not present

## 2023-07-02 DIAGNOSIS — R739 Hyperglycemia, unspecified: Secondary | ICD-10-CM | POA: Diagnosis not present

## 2023-07-04 DIAGNOSIS — E1129 Type 2 diabetes mellitus with other diabetic kidney complication: Secondary | ICD-10-CM | POA: Diagnosis not present

## 2023-07-04 DIAGNOSIS — R739 Hyperglycemia, unspecified: Secondary | ICD-10-CM | POA: Diagnosis not present

## 2023-07-04 DIAGNOSIS — D509 Iron deficiency anemia, unspecified: Secondary | ICD-10-CM | POA: Diagnosis not present

## 2023-07-04 DIAGNOSIS — Z992 Dependence on renal dialysis: Secondary | ICD-10-CM | POA: Diagnosis not present

## 2023-07-04 DIAGNOSIS — N186 End stage renal disease: Secondary | ICD-10-CM | POA: Diagnosis not present

## 2023-07-04 DIAGNOSIS — N2581 Secondary hyperparathyroidism of renal origin: Secondary | ICD-10-CM | POA: Diagnosis not present

## 2023-07-07 DIAGNOSIS — N186 End stage renal disease: Secondary | ICD-10-CM | POA: Diagnosis not present

## 2023-07-07 DIAGNOSIS — R739 Hyperglycemia, unspecified: Secondary | ICD-10-CM | POA: Diagnosis not present

## 2023-07-07 DIAGNOSIS — Z992 Dependence on renal dialysis: Secondary | ICD-10-CM | POA: Diagnosis not present

## 2023-07-07 DIAGNOSIS — D509 Iron deficiency anemia, unspecified: Secondary | ICD-10-CM | POA: Diagnosis not present

## 2023-07-07 DIAGNOSIS — E1129 Type 2 diabetes mellitus with other diabetic kidney complication: Secondary | ICD-10-CM | POA: Diagnosis not present

## 2023-07-07 DIAGNOSIS — N2581 Secondary hyperparathyroidism of renal origin: Secondary | ICD-10-CM | POA: Diagnosis not present

## 2023-07-08 ENCOUNTER — Ambulatory Visit: Payer: Self-pay | Admitting: Licensed Clinical Social Worker

## 2023-07-08 NOTE — Patient Instructions (Signed)
Visit Information  Thank you for taking time to visit with me today. Please don't hesitate to contact me if I can be of assistance to you.   Following are the goals we discussed today:   Goals Addressed             This Visit's Progress    Care Coordination Activities       Care Coordination Interventions: Patient stated that he is a dialysis patient and waiting to receive a kidney and hopes to have on in 2025. Patient has been out of work due to his health and is currently on disability and receives $1603 per month.  Patient stated that he only receives $73 for SNAP benefits and runs low on food, SW educated patient on the [employer] and the patient is going to download the APP. Sw will also mail out food resources for community food pantry's. Patient also stated that since he has been out of work he has fallen behind on his light bill and it is currently over $1000, he has called [employer] and they are giving him until I/12/2023 to pay at least $938. Patient stated that he can't pay all of that at one time and his family is in no shape to assist him either. SW will mail some information for the [employer] program and other resources for assistance SW will follow back up on 07/24/2023 at 3:00 pm        Our next appointment is by telephone on 07/24/2023 at 3:00 pm  Please call the care guide team at [phone] if you need to cancel or reschedule your appointment.   If you are experiencing a Mental Health or Behavioral Health Crisis or need someone to talk to, please call the Suicide and Crisis Lifeline: 988 go to [facility] [address] [place] [phone]) call 911  The patient verbalized understanding of instructions, educational materials, and care plan provided today and DECLINED offer to receive copy of patient instructions, educational materials, and care plan.   Jeanie Cooks, PhD [facility], [facility] Social Worker Direct Dial: [phone]  Fax: [phone]

## 2023-07-09 DIAGNOSIS — N186 End stage renal disease: Secondary | ICD-10-CM | POA: Diagnosis not present

## 2023-07-09 DIAGNOSIS — N2581 Secondary hyperparathyroidism of renal origin: Secondary | ICD-10-CM | POA: Diagnosis not present

## 2023-07-09 DIAGNOSIS — E1129 Type 2 diabetes mellitus with other diabetic kidney complication: Secondary | ICD-10-CM | POA: Diagnosis not present

## 2023-07-09 DIAGNOSIS — D509 Iron deficiency anemia, unspecified: Secondary | ICD-10-CM | POA: Diagnosis not present

## 2023-07-09 DIAGNOSIS — Z992 Dependence on renal dialysis: Secondary | ICD-10-CM | POA: Diagnosis not present

## 2023-07-09 DIAGNOSIS — R739 Hyperglycemia, unspecified: Secondary | ICD-10-CM | POA: Diagnosis not present

## 2023-07-11 DIAGNOSIS — N186 End stage renal disease: Secondary | ICD-10-CM | POA: Diagnosis not present

## 2023-07-11 DIAGNOSIS — R739 Hyperglycemia, unspecified: Secondary | ICD-10-CM | POA: Diagnosis not present

## 2023-07-11 DIAGNOSIS — D509 Iron deficiency anemia, unspecified: Secondary | ICD-10-CM | POA: Diagnosis not present

## 2023-07-11 DIAGNOSIS — Z992 Dependence on renal dialysis: Secondary | ICD-10-CM | POA: Diagnosis not present

## 2023-07-11 DIAGNOSIS — E1129 Type 2 diabetes mellitus with other diabetic kidney complication: Secondary | ICD-10-CM | POA: Diagnosis not present

## 2023-07-11 DIAGNOSIS — N2581 Secondary hyperparathyroidism of renal origin: Secondary | ICD-10-CM | POA: Diagnosis not present

## 2023-07-13 DIAGNOSIS — Z992 Dependence on renal dialysis: Secondary | ICD-10-CM | POA: Diagnosis not present

## 2023-07-13 DIAGNOSIS — E1129 Type 2 diabetes mellitus with other diabetic kidney complication: Secondary | ICD-10-CM | POA: Diagnosis not present

## 2023-07-13 DIAGNOSIS — D509 Iron deficiency anemia, unspecified: Secondary | ICD-10-CM | POA: Diagnosis not present

## 2023-07-13 DIAGNOSIS — N2581 Secondary hyperparathyroidism of renal origin: Secondary | ICD-10-CM | POA: Diagnosis not present

## 2023-07-13 DIAGNOSIS — R739 Hyperglycemia, unspecified: Secondary | ICD-10-CM | POA: Diagnosis not present

## 2023-07-13 DIAGNOSIS — N186 End stage renal disease: Secondary | ICD-10-CM | POA: Diagnosis not present

## 2023-07-15 DIAGNOSIS — R739 Hyperglycemia, unspecified: Secondary | ICD-10-CM | POA: Diagnosis not present

## 2023-07-15 DIAGNOSIS — D509 Iron deficiency anemia, unspecified: Secondary | ICD-10-CM | POA: Diagnosis not present

## 2023-07-15 DIAGNOSIS — E1129 Type 2 diabetes mellitus with other diabetic kidney complication: Secondary | ICD-10-CM | POA: Diagnosis not present

## 2023-07-15 DIAGNOSIS — N2581 Secondary hyperparathyroidism of renal origin: Secondary | ICD-10-CM | POA: Diagnosis not present

## 2023-07-15 DIAGNOSIS — N186 End stage renal disease: Secondary | ICD-10-CM | POA: Diagnosis not present

## 2023-07-15 DIAGNOSIS — Z992 Dependence on renal dialysis: Secondary | ICD-10-CM | POA: Diagnosis not present

## 2023-07-20 DIAGNOSIS — N2581 Secondary hyperparathyroidism of renal origin: Secondary | ICD-10-CM | POA: Diagnosis not present

## 2023-07-20 DIAGNOSIS — N186 End stage renal disease: Secondary | ICD-10-CM | POA: Diagnosis not present

## 2023-07-20 DIAGNOSIS — D509 Iron deficiency anemia, unspecified: Secondary | ICD-10-CM | POA: Diagnosis not present

## 2023-07-20 DIAGNOSIS — Z992 Dependence on renal dialysis: Secondary | ICD-10-CM | POA: Diagnosis not present

## 2023-07-20 DIAGNOSIS — E1129 Type 2 diabetes mellitus with other diabetic kidney complication: Secondary | ICD-10-CM | POA: Diagnosis not present

## 2023-07-20 DIAGNOSIS — R739 Hyperglycemia, unspecified: Secondary | ICD-10-CM | POA: Diagnosis not present

## 2023-07-22 DIAGNOSIS — D509 Iron deficiency anemia, unspecified: Secondary | ICD-10-CM | POA: Diagnosis not present

## 2023-07-22 DIAGNOSIS — I129 Hypertensive chronic kidney disease with stage 1 through stage 4 chronic kidney disease, or unspecified chronic kidney disease: Secondary | ICD-10-CM | POA: Diagnosis not present

## 2023-07-22 DIAGNOSIS — N186 End stage renal disease: Secondary | ICD-10-CM | POA: Diagnosis not present

## 2023-07-22 DIAGNOSIS — Z992 Dependence on renal dialysis: Secondary | ICD-10-CM | POA: Diagnosis not present

## 2023-07-22 DIAGNOSIS — E1129 Type 2 diabetes mellitus with other diabetic kidney complication: Secondary | ICD-10-CM | POA: Diagnosis not present

## 2023-07-22 DIAGNOSIS — N2581 Secondary hyperparathyroidism of renal origin: Secondary | ICD-10-CM | POA: Diagnosis not present

## 2023-07-22 DIAGNOSIS — R739 Hyperglycemia, unspecified: Secondary | ICD-10-CM | POA: Diagnosis not present

## 2023-07-24 ENCOUNTER — Ambulatory Visit: Payer: Self-pay | Admitting: Licensed Clinical Social Worker

## 2023-07-24 NOTE — Patient Outreach (Signed)
  Care Coordination   07/24/2023 Name: Jeremy [name]. MRN: [id number] DOB: Jan 06, 1966   Care Coordination Outreach Attempts:  An unsuccessful outreach was attempted for an appointment today.  Follow Up Plan:  Additional outreach attempts will be made to offer the patient complex care management information and services.   Encounter Outcome:  No Answer   Care Coordination Interventions:  No, not indicated    Tobias CHARM Maranda HEDWIG, PhD [facility], [facility] Social Worker Direct Dial : [phone]  Fax: [phone]

## 2023-07-24 NOTE — Patient Instructions (Signed)
 Visit Information  Thank you for taking time to visit with me today. Please don't hesitate to contact me if I can be of assistance to you.   Following are the goals we discussed today:   Goals Addressed   None     Our next appointment is by telephone on 08/07/2023 at 1:45 pm  Please call the care guide team at [phone] if you need to cancel or reschedule your appointment.   If you are experiencing a Mental Health or Behavioral Health Crisis or need someone to talk to, please call the Suicide and Crisis Lifeline: 988 go to [facility] [address] [place] [phone]) call 911  The patient verbalized understanding of instructions, educational materials, and care plan provided today and DECLINED offer to receive copy of patient instructions, educational materials, and care plan.   Tobias CHARM Maranda HEDWIG, PhD [facility], [facility] Social Worker Direct Dial : [phone]  Fax: [phone]

## 2023-07-26 DIAGNOSIS — R739 Hyperglycemia, unspecified: Secondary | ICD-10-CM | POA: Diagnosis not present

## 2023-07-26 DIAGNOSIS — Z992 Dependence on renal dialysis: Secondary | ICD-10-CM | POA: Diagnosis not present

## 2023-07-26 DIAGNOSIS — E1129 Type 2 diabetes mellitus with other diabetic kidney complication: Secondary | ICD-10-CM | POA: Diagnosis not present

## 2023-07-26 DIAGNOSIS — D631 Anemia in chronic kidney disease: Secondary | ICD-10-CM | POA: Diagnosis not present

## 2023-07-26 DIAGNOSIS — D509 Iron deficiency anemia, unspecified: Secondary | ICD-10-CM | POA: Diagnosis not present

## 2023-07-26 DIAGNOSIS — N186 End stage renal disease: Secondary | ICD-10-CM | POA: Diagnosis not present

## 2023-07-26 DIAGNOSIS — N2581 Secondary hyperparathyroidism of renal origin: Secondary | ICD-10-CM | POA: Diagnosis not present

## 2023-07-29 ENCOUNTER — Other Ambulatory Visit: Payer: Self-pay

## 2023-07-29 ENCOUNTER — Other Ambulatory Visit (HOSPITAL_BASED_OUTPATIENT_CLINIC_OR_DEPARTMENT_OTHER): Payer: Self-pay

## 2023-07-29 ENCOUNTER — Encounter (HOSPITAL_COMMUNITY): Payer: Self-pay

## 2023-07-29 ENCOUNTER — Ambulatory Visit (HOSPITAL_COMMUNITY)
Admission: EM | Admit: 2023-07-29 | Discharge: 2023-07-29 | Disposition: A | Discharge status: Home / Self Care | Payer: Medicare Other | Attending: Family Medicine | Admitting: Family Medicine

## 2023-07-29 DIAGNOSIS — J019 Acute sinusitis, unspecified: Secondary | ICD-10-CM | POA: Diagnosis not present

## 2023-07-29 DIAGNOSIS — J069 Acute upper respiratory infection, unspecified: Secondary | ICD-10-CM | POA: Diagnosis not present

## 2023-07-29 MED ORDER — BENZONATATE 100 MG PO CAPS
100.0000 mg | ORAL_CAPSULE | Freq: Three times a day (TID) | ORAL | 0 refills | Status: DC | PRN
  Filled 2023-07-29 (×2): qty 21, 7d supply, fill #0

## 2023-07-29 MED ORDER — DOXYCYCLINE HYCLATE 100 MG PO TABS
100.0000 mg | ORAL_TABLET | Freq: Two times a day (BID) | ORAL | 0 refills | Status: AC
  Filled 2023-07-29 (×2): qty 14, 7d supply, fill #0

## 2023-07-29 NOTE — ED Triage Notes (Signed)
 Onset last Wednesday after working outside Patient having congestion, cough, sore throat. Cough is productive. States his friend was sick first but she was not tested for anything. Patient is currently on dialysis as well.   Patient tried cold and flu otc, Theraflu, and Nyquil with mild relief.

## 2023-07-29 NOTE — ED Provider Notes (Signed)
 [facility]    CSN: [id number] Arrival date & time: 07/29/23  1008      History   Chief Complaint Chief Complaint  Patient presents with   Cough    HPI Jeremy [name]. is a 58 y.o. male.    Cough Here for cough and congestion and sore throat.  Symptoms began on January 2.  He has not noticed any fever or chills.  The sore throat is better he has had an increase in the sinus drainage in the last day and his cough is worse.  He does not really describe any shortness of breath or wheezing.  No history of asthma  He is on dialysis He also has a history of diabetes and states his sugars have been doing really well  Past Medical History:  Diagnosis Date   Abscess    AKI (acute kidney injury) (HCC) 08/05/2017   Bell's palsy    right eye abn since dx bell's palsy   CKD (chronic kidney disease)    Dr. McDiarmind    COVID 2020   mild   Dyspnea    occasional with exertion   ESRD (end stage renal disease) (HCC)    MWF -[facility]   Headache    migraines   High cholesterol    Hypertension    Left shoulder pain 10/26/2013   S/p injection on 10/26/13    Renal insufficiency 02/14/2014   Seizures (HCC) 08/04/2017   related to high blood sugars (08/05/2017)   Type II diabetes mellitus (HCC)     Patient Active Problem List   Diagnosis Date Noted   Pain, unspecified 12/31/2021   Encounter for removal of sutures 10/05/2021   ESRD on dialysis (HCC) 11/09/2019   Allergy, unspecified, initial encounter 08/16/2019   Anaphylactic shock, unspecified, initial encounter 08/16/2019   Anemia in chronic kidney disease 08/16/2019   Complication of vascular dialysis catheter 08/16/2019   Fever, unspecified 08/16/2019   Iron deficiency anemia, unspecified 08/16/2019   Other specified coagulation defects (HCC) 08/16/2019   Pruritus, unspecified 08/16/2019   Shortness of breath 08/16/2019   Type 2 diabetes mellitus with diabetic peripheral angiopathy without gangrene (HCC)  08/16/2019   COVID-19 virus infection 08/08/2019   Acute right ankle pain 07/27/2019   Tinnitus 05/26/2019   OSA (obstructive sleep apnea) 10/07/2018   Resistant hypertension 09/23/2018   Secondary hyperparathyroidism (HCC) 04/06/2018   Benign neoplasm of pituitary gland (HCC) 08/10/2017   Seizure (HCC)    Bilateral lower extremity edema 04/08/2017   Uncontrolled type 2 diabetes mellitus with diabetic nephropathy, with long-term current use of insulin  03/03/2017   Chronic kidney disease (CKD), stage IV (severe) (HCC) 02/14/2014   Morbid obesity (HCC) 05/20/2013   Erectile dysfunction associated with type 2 diabetes mellitus (HCC) 06/02/2012   Macular edema 12/12/2011   Mixed hyperlipidemia due to type 2 diabetes mellitus (HCC) 11/11/2011    Past Surgical History:  Procedure Laterality Date   A/V FISTULAGRAM Left 07/25/2022   Procedure: A/V Fistulagram;  Surgeon: Gretta Lonni PARAS, MD;  [facility];  Service: Cardiovascular;  Laterality: Left;   AV FISTULA PLACEMENT Left 11/11/2019   Procedure: LEFT FOREARM RADIOCEPHALIC ARTERIOVENOUS (AV) FISTULA CREATION;  Surgeon: Gretta Lonni PARAS, MD;  Location: [facility] OR;  Service: Vascular;  Laterality: Left;   AV FISTULA PLACEMENT Left 08/14/2022   Procedure: FIRST STAGE BRACHIOBASILIC LEFT ARM ARTERIOVENOUS (AV) FISTULA CREATION;  Surgeon: Gretta Lonni PARAS, MD;  Location: [facility] OR;  Service: Vascular;  Laterality: Left;  BASCILIC VEIN TRANSPOSITION Left 10/30/2022   Procedure: LEFT ARM SECOND STAGE BASILIC VEIN TRANSPOSITION;  Surgeon: Gretta Lonni PARAS, MD;  Location: [facility] OR;  Service: Vascular;  Laterality: Left;   Eyelid surgery Right    I & D EXTREMITY  11/11/2011   Procedure: IRRIGATION AND DEBRIDEMENT EXTREMITY;  Surgeon: Deward GORMAN Curvin DOUGLAS, MD;  Location: [facility] OR;  Service: General;  Laterality: Right;  I&D Right Thigh Abscess   INSERTION OF DIALYSIS CATHETER N/A 07/25/2022   Procedure: INSERTION OF DIALYSIS CATHETER;   Surgeon: Gretta Lonni PARAS, MD;  Location: [facility] OR;  Service: Vascular;  Laterality: N/A;   IR FLUORO GUIDE CV LINE RIGHT  08/12/2019   IR US  GUIDE VASC ACCESS RIGHT  08/12/2019   LIGATION OF ARTERIOVENOUS  FISTULA Left 08/14/2022   Procedure: LIGATION OF BRACHIOCEPHALIC ARTERIOVENOUS  FISTULA;  Surgeon: Gretta Lonni PARAS, MD;  Location: [facility] OR;  Service: Vascular;  Laterality: Left;   REVISON OF ARTERIOVENOUS FISTULA Left 03/23/2020   Procedure: LEFT ARM ARTERIOVENOUS FISTULA REVISON, LIGATION OF SIDE BRANCHES AND ELEVATION;  Surgeon: Gretta Lonni PARAS, MD;  Location: [facility] OR;  Service: Vascular;  Laterality: Left;       Home Medications    Prior to Admission medications   Medication Sig Start Date End Date Taking? Authorizing Provider  amLODipine  (NORVASC ) 2.5 MG tablet Take 2.5 mg by mouth at bedtime.   Yes [provider]  aspirin  EC 81 MG tablet Take 1 tablet (81 mg total) by mouth daily. 06/16/23  Yes Theotis Haze ORN, NP  atorvastatin  (LIPITOR) 40 MG tablet Take 1 tablet (40 mg total) by mouth daily. 10/24/22  Yes Newlin, Enobong, MD  benzonatate  (TESSALON ) 100 MG capsule Take 1 capsule (100 mg total) by mouth 3 (three) times daily as needed for cough. 07/29/23  Yes Khayla Koppenhaver K, MD  Blood Glucose Monitoring Suppl (CONTOUR NEXT EZ) w/Device KIT Use to check blood sugar three times daily. 08/06/22  Yes Newlin, Enobong, MD  Blood Pressure Monitoring (BLOOD PRESSURE DIGITAL SOLN) KIT Use to check blood pressure daily. 05/15/23  Yes Fleming, Zelda W, NP  diclofenac  Sodium (VOLTAREN ) 1 % GEL Apply 4 g topically 4 (four) times daily. 08/20/22  Yes Fleming, Zelda W, NP  doxycycline  (VIBRAMYCIN ) 100 MG capsule Take 1 capsule (100 mg total) by mouth 2 (two) times daily for 7 days. 07/29/23 08/05/23 Yes Rue Valladares, Sharlet POUR, MD  enalapril  (VASOTEC ) 5 MG tablet Take 1 tablet (5 mg total) by mouth daily. 10/24/22  Yes Newlin, Enobong, MD  gabapentin  (NEURONTIN ) 100 MG capsule Take 1 capsule (100  mg total) by mouth 3 (three) times a week. Take on HD days after HD (hemodialysis) and in the evenings. 03/12/23  Yes Theotis Haze ORN, NP  glucose blood (CONTOUR NEXT TEST) test strip Use to check blood sugar three times daily. 08/06/22  Yes Newlin, Enobong, MD  Lancet Devices (MICROLET NEXT LANCING DEVICE) MISC 1 Device by Does not apply route 3 (three) times daily. 11/30/19  Yes Theotis Haze ORN, NP  Methoxy PEG-Epoetin  Beta (MIRCERA IJ) Mircera 04/09/23 04/07/24 Yes [provider]  Microlet Lancets MISC Use to check blood sugar three times daily. E11.65 08/06/22  Yes Newlin, Enobong, MD  SENSIPAR  30 MG tablet Take 60 mg by mouth 3 (three) times a week. 11/15/22  Yes [provider]  VELPHORO 500 MG chewable tablet Chew 500 mg by mouth 3 (three) times daily with meals. 02/07/20  Yes [provider]    Family History Family  History  Problem Relation Age of Onset   Diabetes Mother    Heart disease Mother 52   Diabetes Sister    Diabetes Brother    Diabetes Brother    Cancer Maternal Uncle        type unknown   Seizures Niece    Stroke Neg Hx     Social History Social History   Tobacco Use   Smoking status: Never    Passive exposure: Never   Smokeless tobacco: Never  Vaping Use   Vaping status: Never Used  Substance Use Topics   Alcohol use: Not Currently   Drug use: No     Allergies   Amlodipine    Review of Systems Review of Systems  Respiratory:  Positive for cough.      Physical Exam Triage Vital Signs ED Triage Vitals  Encounter Vitals Group     BP 07/29/23 1103 (!) 168/93     Systolic BP Percentile --      Diastolic BP Percentile --      Pulse Rate 07/29/23 1103 76     Resp --      Temp 07/29/23 1103 98.1 F (36.7 C)     Temp Source 07/29/23 1103 Oral     SpO2 07/29/23 1103 98 %     Weight 07/29/23 1103 259 lb (117.5 kg)     Height 07/29/23 1103 5' 8 (1.727 m)     Head Circumference --      Peak Flow --      Pain Score  07/29/23 1059 0     Pain Loc --      Pain Education --      Exclude from Growth Chart --    No data found.  Updated Vital Signs BP (!) 168/93 (BP Location: Right Arm)   Pulse 76   Temp 98.1 F (36.7 C) (Oral)   Ht 5' 8 (1.727 m)   Wt 117.5 kg   SpO2 98%   BMI 39.38 kg/m   Visual Acuity Right Eye Distance:   Left Eye Distance:   Bilateral Distance:    Right Eye Near:   Left Eye Near:    Bilateral Near:     Physical Exam Vitals reviewed.  Constitutional:      General: He is not in acute distress.    Appearance: He is not toxic-appearing.  HENT:     Right Ear: Tympanic membrane and ear canal normal.     Left Ear: Tympanic membrane and ear canal normal.     Nose: Congestion present.     Mouth/Throat:     Mouth: Mucous membranes are moist.     Pharynx: No oropharyngeal exudate or posterior oropharyngeal erythema.  Eyes:     Extraocular Movements: Extraocular movements intact.     Conjunctiva/sclera: Conjunctivae normal.     Pupils: Pupils are equal, round, and reactive to light.  Cardiovascular:     Rate and Rhythm: Normal rate and regular rhythm.     Heart sounds: No murmur heard. Pulmonary:     Effort: Pulmonary effort is normal. No respiratory distress.     Breath sounds: Normal breath sounds. No stridor. No wheezing, rhonchi or rales.  Musculoskeletal:     Cervical back: Neck supple.  Lymphadenopathy:     Cervical: No cervical adenopathy.  Skin:    Capillary Refill: Capillary refill takes less than 2 seconds.     Coloration: Skin is not jaundiced or pale.  Neurological:     General: No  focal deficit present.     Mental Status: He is alert and oriented to person, place, and time.  Psychiatric:        Behavior: Behavior normal.      UC Treatments / Results  Labs (all labs ordered are listed, but only abnormal results are displayed) Labs Reviewed  SARS CORONAVIRUS 2 (TAT 6-24 HRS)    EKG   Radiology No results found.  Procedures Procedures  (including critical care time)  Medications Ordered in UC Medications - No data to display  Initial Impression / Assessment and Plan / UC Course  I have reviewed the triage vital signs and the nursing notes.  Pertinent labs & imaging results that were available during my care of the patient were reviewed by me and considered in my medical decision making (see chart for details).     With his double sickening, I have prescribed doxycycline  for sinusitis.  Tessalon  Perles are sent in for cough  Since there is a question that this might actually be the fifth day COVID swab is sent.  If positive and we can get his prescription done by tonight he would be a candidate for molnupiravir he cannot have Paxlovid with his poor renal function. Final Clinical Impressions(s) / UC Diagnoses   Final diagnoses:  Viral URI with cough  Acute sinusitis, recurrence not specified, unspecified location     Discharge Instructions      Take doxycycline  100 mg --1 capsule 2 times daily for 7 days; this is an antibiotic for possible sinusitis  Take benzonatate  100 mg, 1 tab every 8 hours as needed for cough.   You have been swabbed for COVID, and the test will result in the next 24 hours. Our staff will call you if positive. If the COVID test is positive, you should quarantine until you are fever free for 24 hours and you are starting to feel better, and then take added precautions for the next 5 days, such as physical distancing/wearing a mask and good hand hygiene/washing.    ED Prescriptions     Medication Sig Dispense Auth. Provider   doxycycline  (VIBRAMYCIN ) 100 MG capsule Take 1 capsule (100 mg total) by mouth 2 (two) times daily for 7 days. 14 capsule Jennifermarie Franzen K, MD   benzonatate  (TESSALON ) 100 MG capsule Take 1 capsule (100 mg total) by mouth 3 (three) times daily as needed for cough. 21 capsule Caragh Gasper K, MD      PDMP not reviewed this encounter.   Vonna Sharlet POUR,  MD 07/29/23 [phone]

## 2023-07-29 NOTE — Discharge Instructions (Addendum)
 Take doxycycline  100 mg --1 capsule 2 times daily for 7 days; this is an antibiotic for possible sinusitis  Take benzonatate  100 mg, 1 tab every 8 hours as needed for cough.   You have been swabbed for COVID, and the test will result in the next 24 hours. Our staff will call you if positive. If the COVID test is positive, you should quarantine until you are fever free for 24 hours and you are starting to feel better, and then take added precautions for the next 5 days, such as physical distancing/wearing a mask and good hand hygiene/washing.

## 2023-07-30 DIAGNOSIS — D509 Iron deficiency anemia, unspecified: Secondary | ICD-10-CM | POA: Diagnosis not present

## 2023-07-30 DIAGNOSIS — E1129 Type 2 diabetes mellitus with other diabetic kidney complication: Secondary | ICD-10-CM | POA: Diagnosis not present

## 2023-07-30 DIAGNOSIS — N2581 Secondary hyperparathyroidism of renal origin: Secondary | ICD-10-CM | POA: Diagnosis not present

## 2023-07-30 DIAGNOSIS — N186 End stage renal disease: Secondary | ICD-10-CM | POA: Diagnosis not present

## 2023-07-30 DIAGNOSIS — D631 Anemia in chronic kidney disease: Secondary | ICD-10-CM | POA: Diagnosis not present

## 2023-07-30 DIAGNOSIS — R739 Hyperglycemia, unspecified: Secondary | ICD-10-CM | POA: Diagnosis not present

## 2023-07-30 DIAGNOSIS — Z992 Dependence on renal dialysis: Secondary | ICD-10-CM | POA: Diagnosis not present

## 2023-07-30 LAB — SARS CORONAVIRUS 2 (TAT 6-24 HRS): SARS Coronavirus 2: POSITIVE — AB

## 2023-08-04 DIAGNOSIS — E1129 Type 2 diabetes mellitus with other diabetic kidney complication: Secondary | ICD-10-CM | POA: Diagnosis not present

## 2023-08-04 DIAGNOSIS — D631 Anemia in chronic kidney disease: Secondary | ICD-10-CM | POA: Diagnosis not present

## 2023-08-04 DIAGNOSIS — N2581 Secondary hyperparathyroidism of renal origin: Secondary | ICD-10-CM | POA: Diagnosis not present

## 2023-08-04 DIAGNOSIS — N186 End stage renal disease: Secondary | ICD-10-CM | POA: Diagnosis not present

## 2023-08-04 DIAGNOSIS — R739 Hyperglycemia, unspecified: Secondary | ICD-10-CM | POA: Diagnosis not present

## 2023-08-04 DIAGNOSIS — D509 Iron deficiency anemia, unspecified: Secondary | ICD-10-CM | POA: Diagnosis not present

## 2023-08-04 DIAGNOSIS — Z992 Dependence on renal dialysis: Secondary | ICD-10-CM | POA: Diagnosis not present

## 2023-08-06 DIAGNOSIS — N2581 Secondary hyperparathyroidism of renal origin: Secondary | ICD-10-CM | POA: Diagnosis not present

## 2023-08-06 DIAGNOSIS — D631 Anemia in chronic kidney disease: Secondary | ICD-10-CM | POA: Diagnosis not present

## 2023-08-06 DIAGNOSIS — N186 End stage renal disease: Secondary | ICD-10-CM | POA: Diagnosis not present

## 2023-08-06 DIAGNOSIS — R739 Hyperglycemia, unspecified: Secondary | ICD-10-CM | POA: Diagnosis not present

## 2023-08-06 DIAGNOSIS — E1129 Type 2 diabetes mellitus with other diabetic kidney complication: Secondary | ICD-10-CM | POA: Diagnosis not present

## 2023-08-06 DIAGNOSIS — Z992 Dependence on renal dialysis: Secondary | ICD-10-CM | POA: Diagnosis not present

## 2023-08-06 DIAGNOSIS — D509 Iron deficiency anemia, unspecified: Secondary | ICD-10-CM | POA: Diagnosis not present

## 2023-08-07 ENCOUNTER — Other Ambulatory Visit: Payer: Self-pay

## 2023-08-07 ENCOUNTER — Ambulatory Visit: Payer: Self-pay | Admitting: Licensed Clinical Social Worker

## 2023-08-07 DIAGNOSIS — M25552 Pain in left hip: Secondary | ICD-10-CM | POA: Diagnosis not present

## 2023-08-07 DIAGNOSIS — M5416 Radiculopathy, lumbar region: Secondary | ICD-10-CM | POA: Diagnosis not present

## 2023-08-07 MED ORDER — BACLOFEN 10 MG PO TABS
10.0000 mg | ORAL_TABLET | Freq: Every day | ORAL | 6 refills | Status: AC
  Filled 2023-08-07: qty 30, 30d supply, fill #0

## 2023-08-07 NOTE — Patient Outreach (Signed)
  Care Coordination   Follow Up Visit Note   08/07/2023 Name: Taha Waldbillig. MRN: [id number] DOB: [birth year]  [name]. is a 58 y.o. year old male who sees Claiborne Rigg, NP for primary care. I spoke with  [name]. by phone today.  What matters to the patients health and wellness today?  Food and utility resources received by mail     Goals Addressed   None     SDOH assessments and interventions completed:  Yes  SDOH Interventions Today    Flowsheet Row Most Recent Value  SDOH Interventions   Food Insecurity Interventions Intervention Not Indicated  [Patient received the food resources]  Housing Interventions Intervention Not Indicated  Transportation Interventions Intervention Not Indicated  Utilities Interventions Intervention Not Indicated  [Patient has the [employer] and also called and made payment arrangements with [employer]]        Care Coordination Interventions:  Yes, provided   Follow up plan: No further intervention required.   Encounter Outcome:  Patient Visit Completed  Jeanie Cooks, PhD [facility], [facility] Social Worker Direct Dial: [phone]  Fax: [phone]

## 2023-08-07 NOTE — Patient Instructions (Signed)
Visit Information  Thank you for taking time to visit with me today. Please don't hesitate to contact me if I can be of assistance to you.   Following are the goals we discussed today:   Goals Addressed   None     No further contact needed, SW encouraged patient to let the PCP know if the SW is needed in the future to get back on the SW schedule  Please call the care guide team at [phone] if you need to cancel or reschedule your appointment.   If you are experiencing a Mental Health or Behavioral Health Crisis or need someone to talk to, please call the Suicide and Crisis Lifeline: 988 go to [facility] [address] [place] [phone]) call 911  The patient verbalized understanding of instructions, educational materials, and care plan provided today and DECLINED offer to receive copy of patient instructions, educational materials, and care plan.   Jeanie Cooks, PhD [facility], [facility] Social Worker Direct Dial: [phone]  Fax: [phone]

## 2023-08-08 DIAGNOSIS — Z992 Dependence on renal dialysis: Secondary | ICD-10-CM | POA: Diagnosis not present

## 2023-08-08 DIAGNOSIS — D631 Anemia in chronic kidney disease: Secondary | ICD-10-CM | POA: Diagnosis not present

## 2023-08-08 DIAGNOSIS — N2581 Secondary hyperparathyroidism of renal origin: Secondary | ICD-10-CM | POA: Diagnosis not present

## 2023-08-08 DIAGNOSIS — R739 Hyperglycemia, unspecified: Secondary | ICD-10-CM | POA: Diagnosis not present

## 2023-08-08 DIAGNOSIS — D509 Iron deficiency anemia, unspecified: Secondary | ICD-10-CM | POA: Diagnosis not present

## 2023-08-08 DIAGNOSIS — N186 End stage renal disease: Secondary | ICD-10-CM | POA: Diagnosis not present

## 2023-08-08 DIAGNOSIS — E1129 Type 2 diabetes mellitus with other diabetic kidney complication: Secondary | ICD-10-CM | POA: Diagnosis not present

## 2023-08-11 DIAGNOSIS — D509 Iron deficiency anemia, unspecified: Secondary | ICD-10-CM | POA: Diagnosis not present

## 2023-08-11 DIAGNOSIS — R739 Hyperglycemia, unspecified: Secondary | ICD-10-CM | POA: Diagnosis not present

## 2023-08-11 DIAGNOSIS — N186 End stage renal disease: Secondary | ICD-10-CM | POA: Diagnosis not present

## 2023-08-11 DIAGNOSIS — D631 Anemia in chronic kidney disease: Secondary | ICD-10-CM | POA: Diagnosis not present

## 2023-08-11 DIAGNOSIS — N2581 Secondary hyperparathyroidism of renal origin: Secondary | ICD-10-CM | POA: Diagnosis not present

## 2023-08-11 DIAGNOSIS — E1129 Type 2 diabetes mellitus with other diabetic kidney complication: Secondary | ICD-10-CM | POA: Diagnosis not present

## 2023-08-11 DIAGNOSIS — Z992 Dependence on renal dialysis: Secondary | ICD-10-CM | POA: Diagnosis not present

## 2023-08-13 DIAGNOSIS — R739 Hyperglycemia, unspecified: Secondary | ICD-10-CM | POA: Diagnosis not present

## 2023-08-13 DIAGNOSIS — D509 Iron deficiency anemia, unspecified: Secondary | ICD-10-CM | POA: Diagnosis not present

## 2023-08-13 DIAGNOSIS — E1129 Type 2 diabetes mellitus with other diabetic kidney complication: Secondary | ICD-10-CM | POA: Diagnosis not present

## 2023-08-13 DIAGNOSIS — N2581 Secondary hyperparathyroidism of renal origin: Secondary | ICD-10-CM | POA: Diagnosis not present

## 2023-08-13 DIAGNOSIS — D631 Anemia in chronic kidney disease: Secondary | ICD-10-CM | POA: Diagnosis not present

## 2023-08-13 DIAGNOSIS — Z992 Dependence on renal dialysis: Secondary | ICD-10-CM | POA: Diagnosis not present

## 2023-08-13 DIAGNOSIS — N186 End stage renal disease: Secondary | ICD-10-CM | POA: Diagnosis not present

## 2023-08-15 ENCOUNTER — Ambulatory Visit: Payer: Self-pay | Admitting: Nurse Practitioner

## 2023-08-15 NOTE — Telephone Encounter (Signed)
Copied from CRM [id number]. Topic: Clinical - Red Word Triage >> Aug 15, 2023  9:19 AM Dondra Prader A wrote: Red Word that prompted transfer to Nurse Triage: Patient states that he has been missing dialysis due to not feeling well. He wakes up with morning sickness, his hands feel like they are burning off, stomach feels nauseated.   Chief Complaint: itching burning hands Symptoms: nausea Frequency: ongoing intermittent issue  Pertinent Negatives: Patient denies redness or vomiting  Disposition: [] ED /[] Urgent Care (no appt availability in office) / [x] Appointment(In office/virtual)/ []  [place] [facility]/ [] Home Care/ [] Refused Recommended Disposition /[] [place] [facility]/ []  Follow-up with PCP Additional Notes: The patient reported that last night his hands started itching and burning.  He has been spraying alcohol on his hands and rubbing them together to alleviate the itching.  He stated that this problem has been ongoing and no one is able to tell him what is wrong.  He stated that he would like an x-ray.  He denied redness or any injuries.  He reported nausea but has not vomited.  He missed dialysis due to not being able to sleep last night due to the itching burning sensation.  Scheduled the patient an appointment at pcp's earliest available Monday and placed on wait list for earlier appointment if one becomes available.    Reason for Disposition  [1] MODERATE-SEVERE local itching (i.e., interferes with work, school, activities) AND [2] not improved after 24 hours of hydrocortisone cream  Answer Assessment - Initial Assessment Questions 1. DESCRIPTION: "Describe the itching you are having." "Where is it located?"     Itchy burning sensation 2. SEVERITY: "How bad is it?"    - MILD: Doesn't interfere with normal activities.   - MODERATE-SEVERE: Interferes with work, school, sleep, or other activities.     Interferes with sleep 3. SCRATCHING: "Are there any scratch marks? Bleeding?"      Rub hands together after spraying alcohol on them and stops itching for a little while and starts itching/burning again  4. ONSET: "When did the itching begin?"      Thursday evening  5. CAUSE: "What do you think is causing the itching?"      Unknown  6. OTHER SYMPTOMS: "Do you have any other symptoms?"      nausea  Protocols used: Itching - Localized-A-AH

## 2023-08-15 NOTE — Telephone Encounter (Signed)
Noted

## 2023-08-18 ENCOUNTER — Ambulatory Visit: Payer: Medicare Other | Attending: Nurse Practitioner | Admitting: Nurse Practitioner

## 2023-08-18 ENCOUNTER — Other Ambulatory Visit: Payer: Self-pay

## 2023-08-18 ENCOUNTER — Encounter: Payer: Self-pay | Admitting: Nurse Practitioner

## 2023-08-18 VITALS — BP 138/73 | HR 77 | Resp 19 | Ht 68.0 in | Wt 256.8 lb

## 2023-08-18 DIAGNOSIS — R053 Chronic cough: Secondary | ICD-10-CM

## 2023-08-18 DIAGNOSIS — N186 End stage renal disease: Secondary | ICD-10-CM | POA: Diagnosis not present

## 2023-08-18 DIAGNOSIS — R972 Elevated prostate specific antigen [PSA]: Secondary | ICD-10-CM | POA: Diagnosis not present

## 2023-08-18 DIAGNOSIS — D631 Anemia in chronic kidney disease: Secondary | ICD-10-CM | POA: Diagnosis not present

## 2023-08-18 DIAGNOSIS — R739 Hyperglycemia, unspecified: Secondary | ICD-10-CM | POA: Diagnosis not present

## 2023-08-18 DIAGNOSIS — D229 Melanocytic nevi, unspecified: Secondary | ICD-10-CM

## 2023-08-18 DIAGNOSIS — R634 Abnormal weight loss: Secondary | ICD-10-CM

## 2023-08-18 DIAGNOSIS — D509 Iron deficiency anemia, unspecified: Secondary | ICD-10-CM | POA: Diagnosis not present

## 2023-08-18 DIAGNOSIS — Z992 Dependence on renal dialysis: Secondary | ICD-10-CM | POA: Diagnosis not present

## 2023-08-18 DIAGNOSIS — N2581 Secondary hyperparathyroidism of renal origin: Secondary | ICD-10-CM | POA: Diagnosis not present

## 2023-08-18 DIAGNOSIS — E1129 Type 2 diabetes mellitus with other diabetic kidney complication: Secondary | ICD-10-CM | POA: Diagnosis not present

## 2023-08-18 MED ORDER — PANTOPRAZOLE SODIUM 40 MG PO TBEC
40.0000 mg | DELAYED_RELEASE_TABLET | Freq: Every day | ORAL | 1 refills | Status: DC
  Filled 2023-08-18: qty 90, 90d supply, fill #0
  Filled 2023-12-29: qty 90, 90d supply, fill #1

## 2023-08-18 NOTE — Progress Notes (Signed)
Assessment & Plan:  Jeremy [name] was seen today for unintentional weight loss.  Diagnoses and all orders for this visit:  Unintentional weight loss -     Thyroid Panel With TSH -     VITAMIN D 25 Hydroxy (Vit-D Deficiency, Fractures)  Elevated PSA, greater than or equal to 20 ng/ml He was given the [facility] to [facility] to get scheduled -     PSA -     Ambulatory referral to Urology  Atypical mole -     Ambulatory referral to Dermatology  Chronic cough He was given the number to GI to schedule an appt -     pantoprazole (PROTONIX) 40 MG tablet; Take 1 tablet (40 mg total) by mouth daily before breakfast.    Patient has been counseled on age-appropriate routine health concerns for screening and prevention. These are reviewed and up-to-date. Referrals have been placed accordingly. Immunizations are up-to-date or declined.    Subjective:   Chief Complaint  Patient presents with   Unintentional weight loss    Jeremy [name]. 58 y.o. male presents to office today with concerns of Unintentional Weight loss and chronic cough.   He has a past medical history of Abscess, AKI (08/05/2017), Bell's palsy, CKD , COVID (2020), Dyspnea, ESRD with HD MWF , Headache, High cholesterol, Hypertension, Left shoulder pain (10/26/2013), Renal insufficiency (02/14/2014), Seizures (HCC) (08/04/2017), and Type II diabetes mellitus (HCC).    Jeremy [name] has lost over 20lbs unintentionally over the past 3 months. Also notes chronic cough unresponsive tessalon and increased bout of nausea particularly in the mornings. He has not had a colonoscopy although he has been referred in the past and also he had an elevated PSA in the past >30 and no showed to urology. He has been lost to follow up with GI and Urology.    He has an atypical mole under his left ear that he would like examined and removed if possible.   Review of Systems  Constitutional:  Positive for weight loss. Negative for fever and  malaise/fatigue.  HENT: Negative.  Negative for nosebleeds.   Eyes: Negative.  Negative for blurred vision, double vision and photophobia.  Respiratory:  Positive for cough. Negative for shortness of breath.   Cardiovascular: Negative.  Negative for chest pain, palpitations and leg swelling.  Gastrointestinal:  Positive for nausea. Negative for heartburn and vomiting.  Musculoskeletal: Negative.  Negative for myalgias.  Skin:        SEE HPI  Neurological: Negative.  Negative for dizziness, focal weakness, seizures and headaches.  Psychiatric/Behavioral: Negative.  Negative for suicidal ideas.     Past Medical History:  Diagnosis Date   Abscess    AKI (acute kidney injury) (HCC) 08/05/2017   Bell's palsy    right eye abn since dx bell's palsy   CKD (chronic kidney disease)    Dr. McDiarmind    COVID 2020   mild   Dyspnea    occasional with exertion   ESRD (end stage renal disease) (HCC)    MWF -[facility]   Headache    migraines   High cholesterol    Hypertension    Left shoulder pain 10/26/2013   S/p injection on 10/26/13    Renal insufficiency 02/14/2014   Seizures (HCC) 08/04/2017   "related to high blood sugars" (08/05/2017)   Type II diabetes mellitus [facility])     Past Surgical History:  Procedure Laterality Date   A/V FISTULAGRAM Left 07/25/2022   Procedure: A/V Fistulagram;  Surgeon: Cephus Shelling, MD;  Location: [facility] INVASIVE CV LAB;  Service: Cardiovascular;  Laterality: Left;   AV FISTULA PLACEMENT Left 11/11/2019   Procedure: LEFT FOREARM RADIOCEPHALIC ARTERIOVENOUS (AV) FISTULA CREATION;  Surgeon: Cephus Shelling, MD;  [facility] OR;  Service: Vascular;  Laterality: Left;   AV FISTULA PLACEMENT Left 08/14/2022   Procedure: FIRST STAGE BRACHIOBASILIC LEFT ARM ARTERIOVENOUS (AV) FISTULA CREATION;  Surgeon: Cephus Shelling, MD;  Location: [facility] OR;  Service: Vascular;  Laterality: Left;   BASCILIC VEIN TRANSPOSITION Left 10/30/2022   Procedure: LEFT ARM  SECOND STAGE BASILIC VEIN TRANSPOSITION;  Surgeon: Cephus Shelling, MD;  Location: [facility] OR;  Service: Vascular;  Laterality: Left;   Eyelid surgery Right    I & D EXTREMITY  11/11/2011   Procedure: IRRIGATION AND DEBRIDEMENT EXTREMITY;  Surgeon: Robyne Askew, MD;  Location: [facility] OR;  Service: General;  Laterality: Right;  I&D Right Thigh Abscess   INSERTION OF DIALYSIS CATHETER N/A 07/25/2022   Procedure: INSERTION OF DIALYSIS CATHETER;  Surgeon: Cephus Shelling, MD;  [facility]: [facility] OR;  Service: Vascular;  Laterality: N/A;   IR FLUORO GUIDE CV LINE RIGHT  08/12/2019   IR US GUIDE VASC ACCESS RIGHT  08/12/2019   LIGATION OF ARTERIOVENOUS  FISTULA Left 08/14/2022   Procedure: LIGATION OF BRACHIOCEPHALIC ARTERIOVENOUS  FISTULA;  Surgeon: Cephus Shelling, MD;  Location: [facility] OR;  Service: Vascular;  Laterality: Left;   REVISON OF ARTERIOVENOUS FISTULA Left 03/23/2020   Procedure: LEFT ARM ARTERIOVENOUS FISTULA REVISON, LIGATION OF SIDE BRANCHES AND ELEVATION;  Surgeon: Cephus Shelling, MD;  [facility] OR;  Service: Vascular;  Laterality: Left;    Family History  Problem Relation Age of Onset   Diabetes Mother    Heart disease Mother 32   Diabetes Sister    Diabetes Brother    Diabetes Brother    Cancer Maternal Uncle        type unknown   Seizures Niece    Stroke Neg Hx     Social History Reviewed with no changes to be made today.   Outpatient Medications Prior to Visit  Medication Sig Dispense Refill   amLODipine (NORVASC) 2.5 MG tablet Take 2.5 mg by mouth at bedtime.     aspirin EC 81 MG tablet Take 1 tablet (81 mg total) by mouth daily. 90 tablet 0   baclofen (LIORESAL) 10 MG tablet Take 1 tablet (10 mg total) by mouth at bedtime. 30 tablet 6   Blood Glucose Monitoring Suppl (CONTOUR NEXT EZ) w/Device KIT Use to check blood sugar three times daily. 1 kit 0   Blood Pressure Monitoring (BLOOD PRESSURE DIGITAL SOLN) KIT Use to check blood pressure daily. 1 kit 0    diclofenac Sodium (VOLTAREN) 1 % GEL Apply 4 g topically 4 (four) times daily. 200 g 1   enalapril (VASOTEC) 5 MG tablet Take 1 tablet (5 mg total) by mouth daily. 90 tablet 1   glucose blood (CONTOUR NEXT TEST) test strip Use to check blood sugar three times daily. 100 each 12   Lancet Devices (MICROLET NEXT LANCING DEVICE) MISC 1 Device by Does not apply route 3 (three) times daily. 1 each 11   Methoxy PEG-Epoetin Beta (MIRCERA IJ) Mircera     Microlet Lancets MISC Use to check blood sugar three times daily. E11.65 100 each 3   SENSIPAR 30 MG tablet Take 60 mg by mouth 3 (three) times a week.     VELPHORO 500 MG  chewable tablet Chew 500 mg by mouth 3 (three) times daily with meals.     atorvastatin (LIPITOR) 40 MG tablet Take 1 tablet (40 mg total) by mouth daily. (Patient not taking: Reported on 08/18/2023) 90 tablet 3   benzonatate (TESSALON) 100 MG capsule Take 1 capsule (100 mg total) by mouth 3 (three) times daily as needed for cough. (Patient not taking: Reported on 08/18/2023) 21 capsule 0   gabapentin (NEURONTIN) 100 MG capsule Take 1 capsule (100 mg total) by mouth 3 (three) times a week. Take on HD days after HD (hemodialysis) and in the evenings. (Patient not taking: Reported on 08/18/2023) 90 capsule 3   Facility-Administered Medications Prior to Visit  Medication Dose Route Frequency Provider Last Rate Last Admin   0.9 %  sodium chloride infusion  250 mL Intravenous PRN Cephus Shelling, MD       sodium chloride flush (NS) 0.9 % injection 3 mL  3 mL Intravenous Q12H Cephus Shelling, MD        Allergies  Allergen Reactions   Amlodipine Other (See Comments)    Dizziness       Objective:    BP 138/73 (BP Location: Left Arm, Patient Position: Sitting, Cuff Size: Normal)   Pulse 77   Resp 19   Ht 5\' 8"  (1.727 m)   Wt 256 lb 12.8 oz (116.5 kg)   SpO2 100%   BMI 39.05 kg/m  Wt Readings from Last 3 Encounters:  08/18/23 256 lb 12.8 oz (116.5 kg)  07/29/23 259 lb  (117.5 kg)  06/16/23 277 lb 12.8 oz (126 kg)    Physical Exam Vitals and nursing note reviewed.  Constitutional:      Appearance: He is well-developed.  HENT:     Head: Normocephalic and atraumatic.  Cardiovascular:     Rate and Rhythm: Normal rate and regular rhythm.     Heart sounds: Normal heart sounds. No murmur heard.    No friction rub. No gallop.  Pulmonary:     Effort: Pulmonary effort is normal. No tachypnea or respiratory distress.     Breath sounds: Normal breath sounds. No decreased breath sounds, wheezing, rhonchi or rales.  Chest:     Chest wall: No tenderness.  Abdominal:     General: Bowel sounds are normal.     Palpations: Abdomen is soft.  Musculoskeletal:        General: Normal range of motion.     Cervical back: Normal range of motion.  Skin:    General: Skin is warm and dry.     Comments: SEE attached photo  Neurological:     Mental Status: He is alert and oriented to person, place, and time.     Coordination: Coordination normal.  Psychiatric:        Behavior: Behavior normal. Behavior is cooperative.        Thought Content: Thought content normal.        Judgment: Judgment normal.          Patient has been counseled extensively about nutrition and exercise as well as the importance of adherence with medications and regular follow-up. The patient was given clear instructions to go to ER or return to medical center if symptoms don't improve, worsen or new problems develop. The patient verbalized understanding.   Follow-up: No follow-ups on file.   Claiborne Rigg, FNP-BC [facility] [place], [place] [phone]   08/18/2023, 2:04 PM

## 2023-08-18 NOTE — Patient Instructions (Addendum)
[facility] [address] [place], [place] [address] PH# [phone]   [facility] Address: [address] [place], [place], [place] [address] Phone: [phone]

## 2023-08-19 ENCOUNTER — Encounter: Payer: Self-pay | Admitting: Internal Medicine

## 2023-08-19 LAB — PSA: Prostate Specific Ag, Serum: 33 ng/mL — ABNORMAL HIGH (ref 0.0–4.0)

## 2023-08-19 LAB — THYROID PANEL WITH TSH
Free Thyroxine Index: 2.5 (ref 1.2–4.9)
T3 Uptake Ratio: 29 % (ref 24–39)
T4, Total: 8.5 ug/dL (ref 4.5–12.0)
TSH: 0.773 u[IU]/mL (ref 0.450–4.500)

## 2023-08-19 LAB — VITAMIN D 25 HYDROXY (VIT D DEFICIENCY, FRACTURES): Vit D, 25-Hydroxy: 15.7 ng/mL — ABNORMAL LOW (ref 30.0–100.0)

## 2023-08-20 DIAGNOSIS — N186 End stage renal disease: Secondary | ICD-10-CM | POA: Diagnosis not present

## 2023-08-20 DIAGNOSIS — R739 Hyperglycemia, unspecified: Secondary | ICD-10-CM | POA: Diagnosis not present

## 2023-08-20 DIAGNOSIS — D509 Iron deficiency anemia, unspecified: Secondary | ICD-10-CM | POA: Diagnosis not present

## 2023-08-20 DIAGNOSIS — Z992 Dependence on renal dialysis: Secondary | ICD-10-CM | POA: Diagnosis not present

## 2023-08-20 DIAGNOSIS — E1129 Type 2 diabetes mellitus with other diabetic kidney complication: Secondary | ICD-10-CM | POA: Diagnosis not present

## 2023-08-20 DIAGNOSIS — N2581 Secondary hyperparathyroidism of renal origin: Secondary | ICD-10-CM | POA: Diagnosis not present

## 2023-08-20 DIAGNOSIS — D631 Anemia in chronic kidney disease: Secondary | ICD-10-CM | POA: Diagnosis not present

## 2023-08-21 ENCOUNTER — Other Ambulatory Visit: Payer: Self-pay | Admitting: Family Medicine

## 2023-08-21 ENCOUNTER — Other Ambulatory Visit: Payer: Self-pay

## 2023-08-21 ENCOUNTER — Other Ambulatory Visit: Payer: Self-pay | Admitting: Nurse Practitioner

## 2023-08-21 DIAGNOSIS — L820 Inflamed seborrheic keratosis: Secondary | ICD-10-CM | POA: Diagnosis not present

## 2023-08-21 DIAGNOSIS — D492 Neoplasm of unspecified behavior of bone, soft tissue, and skin: Secondary | ICD-10-CM | POA: Diagnosis not present

## 2023-08-21 DIAGNOSIS — E559 Vitamin D deficiency, unspecified: Secondary | ICD-10-CM

## 2023-08-21 DIAGNOSIS — L918 Other hypertrophic disorders of the skin: Secondary | ICD-10-CM | POA: Diagnosis not present

## 2023-08-21 MED ORDER — CONTOUR NEXT TEST VI STRP
ORAL_STRIP | 12 refills | Status: DC
  Filled 2023-08-21: qty 100, 33d supply, fill #0

## 2023-08-21 MED ORDER — VITAMIN D (ERGOCALCIFEROL) 1.25 MG (50000 UNIT) PO CAPS
50000.0000 [IU] | ORAL_CAPSULE | ORAL | 0 refills | Status: DC
  Filled 2023-08-21: qty 12, 84d supply, fill #0

## 2023-08-22 ENCOUNTER — Other Ambulatory Visit: Payer: Self-pay

## 2023-08-22 DIAGNOSIS — Z992 Dependence on renal dialysis: Secondary | ICD-10-CM | POA: Diagnosis not present

## 2023-08-22 DIAGNOSIS — N2581 Secondary hyperparathyroidism of renal origin: Secondary | ICD-10-CM | POA: Diagnosis not present

## 2023-08-22 DIAGNOSIS — N186 End stage renal disease: Secondary | ICD-10-CM | POA: Diagnosis not present

## 2023-08-22 DIAGNOSIS — I129 Hypertensive chronic kidney disease with stage 1 through stage 4 chronic kidney disease, or unspecified chronic kidney disease: Secondary | ICD-10-CM | POA: Diagnosis not present

## 2023-08-22 DIAGNOSIS — R739 Hyperglycemia, unspecified: Secondary | ICD-10-CM | POA: Diagnosis not present

## 2023-08-22 DIAGNOSIS — E1129 Type 2 diabetes mellitus with other diabetic kidney complication: Secondary | ICD-10-CM | POA: Diagnosis not present

## 2023-08-22 DIAGNOSIS — D509 Iron deficiency anemia, unspecified: Secondary | ICD-10-CM | POA: Diagnosis not present

## 2023-08-22 DIAGNOSIS — D631 Anemia in chronic kidney disease: Secondary | ICD-10-CM | POA: Diagnosis not present

## 2023-08-25 ENCOUNTER — Telehealth: Payer: Self-pay

## 2023-08-25 DIAGNOSIS — Z992 Dependence on renal dialysis: Secondary | ICD-10-CM | POA: Diagnosis not present

## 2023-08-25 DIAGNOSIS — N2581 Secondary hyperparathyroidism of renal origin: Secondary | ICD-10-CM | POA: Diagnosis not present

## 2023-08-25 DIAGNOSIS — R739 Hyperglycemia, unspecified: Secondary | ICD-10-CM | POA: Diagnosis not present

## 2023-08-25 DIAGNOSIS — D509 Iron deficiency anemia, unspecified: Secondary | ICD-10-CM | POA: Diagnosis not present

## 2023-08-25 DIAGNOSIS — D631 Anemia in chronic kidney disease: Secondary | ICD-10-CM | POA: Diagnosis not present

## 2023-08-25 DIAGNOSIS — E1129 Type 2 diabetes mellitus with other diabetic kidney complication: Secondary | ICD-10-CM | POA: Diagnosis not present

## 2023-08-25 DIAGNOSIS — N186 End stage renal disease: Secondary | ICD-10-CM | POA: Diagnosis not present

## 2023-08-25 NOTE — Telephone Encounter (Signed)
[name], In the patient's medical record it states he is ESRD and on dialysis.  Per protocol, this patient will need to have his procedure at the hospital, correct? Please advise Thanks  Bre, PV RN

## 2023-08-25 NOTE — Telephone Encounter (Signed)
Dr. Marina Goodell, Please review previous message and advise if you prefer the patient be scheduled for an OV or if they can be a direct at the hospital. Thank you Bre, PV RN

## 2023-08-25 NOTE — Telephone Encounter (Signed)
 Office visit

## 2023-08-26 ENCOUNTER — Telehealth: Payer: Self-pay | Admitting: Adult Health

## 2023-08-26 ENCOUNTER — Other Ambulatory Visit: Payer: Self-pay

## 2023-08-26 ENCOUNTER — Ambulatory Visit: Payer: Medicare Other | Attending: Nurse Practitioner | Admitting: Nurse Practitioner

## 2023-08-26 ENCOUNTER — Encounter: Payer: Self-pay | Admitting: Nurse Practitioner

## 2023-08-26 VITALS — BP 126/68 | HR 71 | Resp 19 | Ht 68.0 in | Wt 254.8 lb

## 2023-08-26 DIAGNOSIS — Z0289 Encounter for other administrative examinations: Secondary | ICD-10-CM

## 2023-08-26 DIAGNOSIS — Z7984 Long term (current) use of oral hypoglycemic drugs: Secondary | ICD-10-CM

## 2023-08-26 DIAGNOSIS — I10 Essential (primary) hypertension: Secondary | ICD-10-CM | POA: Diagnosis not present

## 2023-08-26 DIAGNOSIS — E1169 Type 2 diabetes mellitus with other specified complication: Secondary | ICD-10-CM | POA: Diagnosis not present

## 2023-08-26 DIAGNOSIS — D688 Other specified coagulation defects: Secondary | ICD-10-CM

## 2023-08-26 DIAGNOSIS — E782 Mixed hyperlipidemia: Secondary | ICD-10-CM | POA: Diagnosis not present

## 2023-08-26 DIAGNOSIS — E1151 Type 2 diabetes mellitus with diabetic peripheral angiopathy without gangrene: Secondary | ICD-10-CM | POA: Diagnosis not present

## 2023-08-26 DIAGNOSIS — Z23 Encounter for immunization: Secondary | ICD-10-CM

## 2023-08-26 DIAGNOSIS — Z794 Long term (current) use of insulin: Secondary | ICD-10-CM

## 2023-08-26 MED ORDER — ASPIRIN 81 MG PO TBEC
81.0000 mg | DELAYED_RELEASE_TABLET | Freq: Every day | ORAL | 0 refills | Status: DC
  Filled 2023-08-26: qty 90, 90d supply, fill #0

## 2023-08-26 MED ORDER — ATORVASTATIN CALCIUM 40 MG PO TABS
40.0000 mg | ORAL_TABLET | Freq: Every day | ORAL | 3 refills | Status: DC
  Filled 2023-08-26: qty 90, 90d supply, fill #0
  Filled 2023-12-29: qty 90, 90d supply, fill #1

## 2023-08-26 NOTE — Progress Notes (Signed)
 Paperwork

## 2023-08-26 NOTE — Telephone Encounter (Signed)
Pt said have a form from [facility] needs to be filled out by neurologist in regards to driver license. Pt said can drop off form today before 5 pm.

## 2023-08-26 NOTE — Patient Instructions (Addendum)
[facility] Address: [address], [place], [place] [address] Phone: [phone]

## 2023-08-26 NOTE — Progress Notes (Signed)
 Assessment & Plan:  Jeremy [name] was seen today for dmv forms.  Diagnoses and all orders for this visit:  Primary hypertension Continue all antihypertensives as prescribed.  Reminded to bring in blood pressure log for follow  up appointment.  RECOMMENDATIONS: DASH/Mediterranean Diets are healthier choices for HTN.    Mixed hyperlipidemia due to type 2 diabetes mellitus -     atorvastatin  (LIPITOR) 40 MG tablet; Take 1 tablet (40 mg total) by mouth daily. INSTRUCTIONS: Work on a low fat, heart healthy diet and participate in regular aerobic exercise program by working out at least 150 minutes per week; 5 days a week-30 minutes per day. Avoid red meat/beef/steak,  fried foods. junk foods, sodas, sugary drinks, unhealthy snacking, alcohol and smoking.  Drink at least 80 oz of water  per day and monitor your carbohydrate intake daily.    Type 2 diabetes mellitus with diabetic peripheral angiopathy without gangrene, with long-term current use of insulin  -     atorvastatin  (LIPITOR) 40 MG tablet; Take 1 tablet (40 mg total) by mouth daily. Continue blood sugar control as discussed in office today, low carbohydrate diet, and regular physical exercise as tolerated, 150 minutes per week (30 min each day, 5 days per week, or 50 min 3 days per week). Keep blood sugar logs with fasting goal of 90-130 mg/dl, post prandial (after you eat) less than 180.  For Hypoglycemia: BS <60 and Hyperglycemia BS >400; contact the clinic ASAP. Annual eye exams and foot exams are recommended.   Other specified coagulation defects  -     aspirin  EC 81 MG tablet; Take 1 tablet (81 mg total) by mouth daily.    Patient has been counseled on age-appropriate routine health concerns for screening and prevention. These are reviewed and up-to-date. Referrals have been placed accordingly. Immunizations are up-to-date or declined.    Subjective:   Chief Complaint  Patient presents with  . DMV Forms    Jeremy [name]. 58  y.o. male presents to office today to have DMV forms filled out. He has a history of seizures and was previously on driving restrictions.  The restrictions were lifted several years ago however he continues to receive DMV forms annually for requesting medical approval to continue driving.  He has not had a seizure in several years and is taking his seizure medication as prescribed.    Review of Systems  Constitutional:  Negative for fever, malaise/fatigue and weight loss.  HENT: Negative.  Negative for nosebleeds.   Eyes: Negative.  Negative for blurred vision, double vision and photophobia.  Respiratory: Negative.  Negative for cough and shortness of breath.   Cardiovascular: Negative.  Negative for chest pain, palpitations and leg swelling.  Gastrointestinal: Negative.  Negative for heartburn, nausea and vomiting.  Musculoskeletal: Negative.  Negative for myalgias.  Neurological: Negative.  Negative for dizziness, focal weakness, seizures and headaches.  Psychiatric/Behavioral: Negative.  Negative for suicidal ideas.     Past Medical History:  Diagnosis Date  . Abscess   . AKI (acute kidney injury) (HCC) 08/05/2017  . Bell's palsy    right eye abn since dx bell's palsy  . CKD (chronic kidney disease)    Dr. McDiarmind   . COVID 2020   mild  . Dyspnea    occasional with exertion  . ESRD (end stage renal disease) (HCC)    MWF -[facility]  . Headache    migraines  . High cholesterol   . Hypertension   . Left shoulder pain 10/26/2013  S/p injection on 10/26/13   . Renal insufficiency 02/14/2014  . Seizures (HCC) 08/04/2017   related to high blood sugars (08/05/2017)  . Type II diabetes mellitus (HCC)     Past Surgical History:  Procedure Laterality Date  . A/V FISTULAGRAM Left 07/25/2022   Procedure: A/V Fistulagram;  Surgeon: Gretta Lonni PARAS, MD;  Location: [facility] INVASIVE CV LAB;  Service: Cardiovascular;  Laterality: Left;  . AV FISTULA PLACEMENT Left 11/11/2019    Procedure: LEFT FOREARM RADIOCEPHALIC ARTERIOVENOUS (AV) FISTULA CREATION;  Surgeon: Gretta Lonni PARAS, MD;  Location: [facility] OR;  Service: Vascular;  Laterality: Left;  . AV FISTULA PLACEMENT Left 08/14/2022   Procedure: FIRST STAGE BRACHIOBASILIC LEFT ARM ARTERIOVENOUS (AV) FISTULA CREATION;  Surgeon: Gretta Lonni PARAS, MD;  Location: [facility] OR;  Service: Vascular;  Laterality: Left;  . BASCILIC VEIN TRANSPOSITION Left 10/30/2022   Procedure: LEFT ARM SECOND STAGE BASILIC VEIN TRANSPOSITION;  Surgeon: Gretta Lonni PARAS, MD;  [facility] OR;  Service: Vascular;  Laterality: Left;  . Eyelid surgery Right   . I & D EXTREMITY  11/11/2011   Procedure: IRRIGATION AND DEBRIDEMENT EXTREMITY;  Surgeon: Deward GORMAN Curvin DOUGLAS, MD;  Location: [facility] OR;  Service: General;  Laterality: Right;  I&D Right Thigh Abscess  . INSERTION OF DIALYSIS CATHETER N/A 07/25/2022   Procedure: INSERTION OF DIALYSIS CATHETER;  Surgeon: Gretta Lonni PARAS, MD;  Location: [facility] OR;  Service: Vascular;  Laterality: N/A;  . IR FLUORO GUIDE CV LINE RIGHT  08/12/2019  . IR US  GUIDE VASC ACCESS RIGHT  08/12/2019  . LIGATION OF ARTERIOVENOUS  FISTULA Left 08/14/2022   Procedure: LIGATION OF BRACHIOCEPHALIC ARTERIOVENOUS  FISTULA;  Surgeon: Gretta Lonni PARAS, MD;  Location: [facility] OR;  Service: Vascular;  Laterality: Left;  . REVISON OF ARTERIOVENOUS FISTULA Left 03/23/2020   Procedure: LEFT ARM ARTERIOVENOUS FISTULA REVISON, LIGATION OF SIDE BRANCHES AND ELEVATION;  Surgeon: Gretta Lonni PARAS, MD;  Location: [facility] OR;  Service: Vascular;  Laterality: Left;    Family History  Problem Relation Age of Onset  . Diabetes Mother   . Heart disease Mother 28  . Diabetes Sister   . Diabetes Brother   . Diabetes Brother   . Cancer Maternal Uncle        type unknown  . Seizures Niece   . Stroke Neg Hx     Social History Reviewed with no changes to be made today.   Outpatient Medications Prior to Visit  Medication Sig Dispense Refill  . amLODipine   (NORVASC ) 2.5 MG tablet Take 2.5 mg by mouth at bedtime.    . Blood Glucose Monitoring Suppl (CONTOUR NEXT EZ) w/Device KIT Use to check blood sugar three times daily. 1 kit 0  . Blood Pressure Monitoring (BLOOD PRESSURE DIGITAL SOLN) KIT Use to check blood pressure daily. 1 kit 0  . diclofenac  Sodium (VOLTAREN ) 1 % GEL Apply 4 g topically 4 (four) times daily. 200 g 1  . enalapril  (VASOTEC ) 5 MG tablet Take 1 tablet (5 mg total) by mouth daily. 90 tablet 1  . glucose blood (CONTOUR NEXT TEST) test strip Use to check blood sugar three times daily. 100 each 12  . Lancet Devices (MICROLET NEXT LANCING DEVICE) MISC 1 Device by Does not apply route 3 (three) times daily. 1 each 11  . Methoxy PEG-Epoetin  Beta (MIRCERA IJ) Mircera    . Microlet Lancets MISC Use to check blood sugar three times daily. E11.65 100 each 3  . pantoprazole  (PROTONIX ) 40 MG tablet Take 1  tablet (40 mg total) by mouth daily before breakfast. 90 tablet 1  . aspirin  EC 81 MG tablet Take 1 tablet (81 mg total) by mouth daily. 90 tablet 0  . atorvastatin  (LIPITOR) 40 MG tablet Take 1 tablet (40 mg total) by mouth daily. 90 tablet 3  . baclofen  (LIORESAL ) 10 MG tablet Take 1 tablet (10 mg total) by mouth at bedtime. (Patient not taking: Reported on 08/26/2023) 30 tablet 6  . benzonatate  (TESSALON ) 100 MG capsule Take 1 capsule (100 mg total) by mouth 3 (three) times daily as needed for cough. (Patient not taking: Reported on 08/26/2023) 21 capsule 0  . gabapentin  (NEURONTIN ) 100 MG capsule Take 1 capsule (100 mg total) by mouth 3 (three) times a week. Take on HD days after HD (hemodialysis) and in the evenings. (Patient not taking: Reported on 08/26/2023) 90 capsule 3  . SENSIPAR  30 MG tablet Take 60 mg by mouth 3 (three) times a week. (Patient not taking: Reported on 08/26/2023)    . VELPHORO 500 MG chewable tablet Chew 500 mg by mouth 3 (three) times daily with meals. (Patient not taking: Reported on 08/26/2023)    . Vitamin D ,  Ergocalciferol , (DRISDOL ) 1.25 MG (50000 UNIT) CAPS capsule Take 1 capsule (50,000 Units total) by mouth once a week. (Patient not taking: Reported on 08/26/2023) 12 capsule 0   Facility-Administered Medications Prior to Visit  Medication Dose Route Frequency Provider Last Rate Last Admin  . 0.9 %  sodium chloride  infusion  250 mL Intravenous PRN Gretta Lonni PARAS, MD      . sodium chloride  flush (NS) 0.9 % injection 3 mL  3 mL Intravenous Q12H Gretta Lonni PARAS, MD        Allergies  Allergen Reactions  . Amlodipine  Other (See Comments)    Dizziness       Objective:    BP 126/68 (BP Location: Right Arm, Patient Position: Sitting, Cuff Size: Large)   Pulse 71   Resp 19   Ht 5' 8 (1.727 m)   Wt 254 lb 12.8 oz (115.6 kg)   SpO2 100%   BMI 38.74 kg/m  Wt Readings from Last 3 Encounters:  08/26/23 254 lb 12.8 oz (115.6 kg)  08/18/23 256 lb 12.8 oz (116.5 kg)  07/29/23 259 lb (117.5 kg)    Physical Exam Vitals and nursing note reviewed.  Constitutional:      Appearance: He is well-developed.  HENT:     Head: Normocephalic and atraumatic.  Cardiovascular:     Rate and Rhythm: Normal rate and regular rhythm.     Heart sounds: Normal heart sounds. No murmur heard.    No friction rub. No gallop.  Pulmonary:     Effort: Pulmonary effort is normal. No tachypnea or respiratory distress.     Breath sounds: Normal breath sounds. No decreased breath sounds, wheezing, rhonchi or rales.  Chest:     Chest wall: No tenderness.  Abdominal:     General: Bowel sounds are normal.     Palpations: Abdomen is soft.  Musculoskeletal:        General: Normal range of motion.     Cervical back: Normal range of motion.  Skin:    General: Skin is warm and dry.  Neurological:     Mental Status: He is alert and oriented to person, place, and time.     Coordination: Coordination normal.  Psychiatric:        Behavior: Behavior normal. Behavior is cooperative.  Thought Content:  Thought content normal.        Judgment: Judgment normal.         Patient has been counseled extensively about nutrition and exercise as well as the importance of adherence with medications and regular follow-up. The patient was given clear instructions to go to ER or return to medical center if symptoms don't improve, worsen or new problems develop. The patient verbalized understanding.   Follow-up: Return if symptoms worsen or fail to improve.   Jeremy LELON Servant, FNP-BC [facility] [place], [place] [phone]   08/26/2023, 12:30 PM

## 2023-08-26 NOTE — Telephone Encounter (Signed)
 Called and spoke with patient- patient advised of provider's recommendations and is agreeable to plan;  patient scheduled for OV with APP on 09/04/2023 at 9:30 am, patient aware of need to check in at 9:20 am; patient advised of need for compliance with appts;  Patient verbalized understanding of information/instructions;  Patient advised to call back to the office at [phone] should questions/concerns arise; PV and [facility] procedure appts cancelled;

## 2023-08-27 DIAGNOSIS — D631 Anemia in chronic kidney disease: Secondary | ICD-10-CM | POA: Diagnosis not present

## 2023-08-27 DIAGNOSIS — N186 End stage renal disease: Secondary | ICD-10-CM | POA: Diagnosis not present

## 2023-08-27 DIAGNOSIS — N2581 Secondary hyperparathyroidism of renal origin: Secondary | ICD-10-CM | POA: Diagnosis not present

## 2023-08-27 DIAGNOSIS — R739 Hyperglycemia, unspecified: Secondary | ICD-10-CM | POA: Diagnosis not present

## 2023-08-27 DIAGNOSIS — D509 Iron deficiency anemia, unspecified: Secondary | ICD-10-CM | POA: Diagnosis not present

## 2023-08-27 DIAGNOSIS — Z992 Dependence on renal dialysis: Secondary | ICD-10-CM | POA: Diagnosis not present

## 2023-08-27 DIAGNOSIS — E1129 Type 2 diabetes mellitus with other diabetic kidney complication: Secondary | ICD-10-CM | POA: Diagnosis not present

## 2023-08-29 DIAGNOSIS — E1129 Type 2 diabetes mellitus with other diabetic kidney complication: Secondary | ICD-10-CM | POA: Diagnosis not present

## 2023-08-29 DIAGNOSIS — R739 Hyperglycemia, unspecified: Secondary | ICD-10-CM | POA: Diagnosis not present

## 2023-08-29 DIAGNOSIS — D509 Iron deficiency anemia, unspecified: Secondary | ICD-10-CM | POA: Diagnosis not present

## 2023-08-29 DIAGNOSIS — N186 End stage renal disease: Secondary | ICD-10-CM | POA: Diagnosis not present

## 2023-08-29 DIAGNOSIS — Z992 Dependence on renal dialysis: Secondary | ICD-10-CM | POA: Diagnosis not present

## 2023-08-29 DIAGNOSIS — D631 Anemia in chronic kidney disease: Secondary | ICD-10-CM | POA: Diagnosis not present

## 2023-08-29 DIAGNOSIS — N2581 Secondary hyperparathyroidism of renal origin: Secondary | ICD-10-CM | POA: Diagnosis not present

## 2023-09-01 DIAGNOSIS — D509 Iron deficiency anemia, unspecified: Secondary | ICD-10-CM | POA: Diagnosis not present

## 2023-09-01 DIAGNOSIS — N2581 Secondary hyperparathyroidism of renal origin: Secondary | ICD-10-CM | POA: Diagnosis not present

## 2023-09-01 DIAGNOSIS — Z992 Dependence on renal dialysis: Secondary | ICD-10-CM | POA: Diagnosis not present

## 2023-09-01 DIAGNOSIS — N186 End stage renal disease: Secondary | ICD-10-CM | POA: Diagnosis not present

## 2023-09-01 DIAGNOSIS — E1129 Type 2 diabetes mellitus with other diabetic kidney complication: Secondary | ICD-10-CM | POA: Diagnosis not present

## 2023-09-01 DIAGNOSIS — D631 Anemia in chronic kidney disease: Secondary | ICD-10-CM | POA: Diagnosis not present

## 2023-09-01 DIAGNOSIS — R739 Hyperglycemia, unspecified: Secondary | ICD-10-CM | POA: Diagnosis not present

## 2023-09-02 DIAGNOSIS — M48061 Spinal stenosis, lumbar region without neurogenic claudication: Secondary | ICD-10-CM | POA: Diagnosis not present

## 2023-09-02 DIAGNOSIS — M4316 Spondylolisthesis, lumbar region: Secondary | ICD-10-CM | POA: Diagnosis not present

## 2023-09-02 DIAGNOSIS — M545 Low back pain, unspecified: Secondary | ICD-10-CM | POA: Diagnosis not present

## 2023-09-03 DIAGNOSIS — D631 Anemia in chronic kidney disease: Secondary | ICD-10-CM | POA: Diagnosis not present

## 2023-09-03 DIAGNOSIS — Z992 Dependence on renal dialysis: Secondary | ICD-10-CM | POA: Diagnosis not present

## 2023-09-03 DIAGNOSIS — N2581 Secondary hyperparathyroidism of renal origin: Secondary | ICD-10-CM | POA: Diagnosis not present

## 2023-09-03 DIAGNOSIS — D509 Iron deficiency anemia, unspecified: Secondary | ICD-10-CM | POA: Diagnosis not present

## 2023-09-03 DIAGNOSIS — R739 Hyperglycemia, unspecified: Secondary | ICD-10-CM | POA: Diagnosis not present

## 2023-09-03 DIAGNOSIS — E1129 Type 2 diabetes mellitus with other diabetic kidney complication: Secondary | ICD-10-CM | POA: Diagnosis not present

## 2023-09-03 DIAGNOSIS — N186 End stage renal disease: Secondary | ICD-10-CM | POA: Diagnosis not present

## 2023-09-04 ENCOUNTER — Ambulatory Visit: Payer: Medicare Other | Admitting: Gastroenterology

## 2023-09-04 ENCOUNTER — Encounter: Payer: Self-pay | Admitting: Gastroenterology

## 2023-09-04 VITALS — BP 140/74 | HR 83 | Ht 68.0 in | Wt 251.0 lb

## 2023-09-04 DIAGNOSIS — N186 End stage renal disease: Secondary | ICD-10-CM | POA: Diagnosis not present

## 2023-09-04 DIAGNOSIS — K5900 Constipation, unspecified: Secondary | ICD-10-CM

## 2023-09-04 DIAGNOSIS — Z992 Dependence on renal dialysis: Secondary | ICD-10-CM | POA: Diagnosis not present

## 2023-09-04 DIAGNOSIS — D649 Anemia, unspecified: Secondary | ICD-10-CM

## 2023-09-04 DIAGNOSIS — Z1211 Encounter for screening for malignant neoplasm of colon: Secondary | ICD-10-CM

## 2023-09-04 MED ORDER — SUFLAVE 178.7 G PO SOLR: 1.0000 | Freq: Once | ORAL | 0 refills | Status: AC

## 2023-09-04 NOTE — Patient Instructions (Addendum)
Start taking Miralax 1 capful (17 grams) 1x / day for 1 week.   If this is not effective, increase to 1 dose 2x / day for 1 week.   If this is still not effective, increase to two capfuls (34 grams) 2x / day.   Can adjust dose as needed based on response. Can take 1/2 cap daily, skip days, or increase per day.    You have been scheduled for a colonoscopy. Please follow written instructions given to you at your visit today.   If you use inhalers (even only as needed), please bring them with you on the day of your procedure.  DO NOT TAKE 7 DAYS PRIOR TO TEST- Trulicity (dulaglutide) Ozempic, Wegovy (semaglutide) Mounjaro (tirzepatide) Bydureon Bcise (exanatide extended release)  DO NOT TAKE 1 DAY PRIOR TO YOUR TEST Rybelsus (semaglutide) Adlyxin (lixisenatide) Victoza (liraglutide) Byetta (exanatide) ___________________________________________________________________________  Jeremy [name] will receive your bowel preparation through [facility], which ensures the lowest copay and home delivery, with outreach via text or call from an [phone] number. Please respond promptly to avoid rescheduling of your procedure. If you are interested in alternative options or have any questions regarding your prep, please contact them at [phone] ____________________________________________________________________________  Your Provider Has Sent Your Bowel Prep Regimen To [facility]   [facility] will contact you to verify your information and collect your copay, if applicable. Enjoy the comfort of your home while your prescription is mailed to you, FREE of any shipping charges.   [facility] accepts all major insurance benefits and applies discounts & coupons.  Have additional questions?   Chat: [email or web address] Call: [phone] Email: [email or web address] NCPDP: [id number]  How will [facility] contact you?  With a Welcome phone call,  a Welcome text and a checkout link in text form.  Texts  you receive from [phone] Are NOT Spam.  *To set up delivery, you must complete the checkout process via link or speak to one of the patient care representatives. If [facility] is unable to reach you, your prescription may be delayed.  To avoid long hold times on the phone, you may also utilize the secure chat feature on the [facility] website to request that they call you back for transaction completion or to expedite your concerns.  _______________________________________________________  If your blood pressure at your visit was 140/90 or greater, please contact your primary care physician to follow up on this.  _______________________________________________________  If you are age 14 or older, your body mass index should be between 23-30. Your Body mass index is 38.16 kg/m. If this is out of the aforementioned range listed, please consider follow up with your Primary Care Provider.  If you are age 51 or younger, your body mass index should be between 19-25. Your Body mass index is 38.16 kg/m. If this is out of the aformentioned range listed, please consider follow up with your Primary Care Provider.   ________________________________________________________  The [facility] providers would like to encourage you to use [facility] to communicate with providers for non-urgent requests or questions.  Due to long hold times on the telephone, sending your provider a message by [facility] may be a faster and more efficient way to get a response.  Please allow 48 business hours for a response.  Please remember that this is for non-urgent requests.  _______________________________________________________

## 2023-09-04 NOTE — Progress Notes (Signed)
BM, Agree with assessment and plans. In order to accommodate the patient's schedule, based on his dialysis, it appears that he has been scheduled at the hospital with Dr. Leone Payor. I will copy him on this communication (in case you did not).  As well, I thank him in advance. Dr. Marina Goodell

## 2023-09-04 NOTE — Progress Notes (Signed)
Chief Complaint: Screening colonoscopy Primary GI MD: Dr. Marina Goodell  HPI: 58 year old male history of obesity, ESRD on dialysis, hypertension, diabetes requiring insulin, presents for evaluation of screening colonoscopy  Last seen August 2019 by Dr. Marina Goodell At that time patient was planned to undergo screening colonoscopy.  However, this was not pursued due to illness  Last labs 03/12/2023 with normocytic anemia with Hgb 11.8.  BUN 17, creatinine 5.87, GFR 11  Patient states he is here today for evaluation of screening colonoscopy.  He recently got over illness and is feeling much better.  Over the last year he has intentionally lost 60 pounds as his goal is to come off dialysis at some point.  He has struggled with some intermittent constipation with mild relief taking stool softeners.  He feels like he needs something a little better than stool softeners sometimes.  Denies rectal bleeding, nausea, vomiting, GERD  Wt Readings from Last 3 Encounters:  09/04/23 251 lb (113.9 kg)  08/26/23 254 lb 12.8 oz (115.6 kg)  08/18/23 256 lb 12.8 oz (116.5 kg)    Past Medical History:  Diagnosis Date   Abscess    AKI (acute kidney injury) (HCC) 08/05/2017   Bell's palsy    right eye abn since dx bell's palsy   CKD (chronic kidney disease)    Dr. McDiarmind    COVID 2020   mild   Dyspnea    occasional with exertion   ESRD (end stage renal disease) (HCC)    MWF -[facility]   Headache    migraines   High cholesterol    Hypertension    Left shoulder pain 10/26/2013   S/p injection on 10/26/13    Renal insufficiency 02/14/2014   Seizures (HCC) 08/04/2017   "related to high blood sugars" (08/05/2017)   Type II diabetes mellitus (HCC)     Past Surgical History:  Procedure Laterality Date   A/V FISTULAGRAM Left 07/25/2022   Procedure: A/V Fistulagram;  Surgeon: Cephus Shelling, MD;  Location: [facility];  Service: Cardiovascular;  Laterality: Left;   AV FISTULA PLACEMENT Left  11/11/2019   Procedure: LEFT FOREARM RADIOCEPHALIC ARTERIOVENOUS (AV) FISTULA CREATION;  Surgeon: Cephus Shelling, MD;  Location: [facility] OR;  Service: Vascular;  Laterality: Left;   AV FISTULA PLACEMENT Left 08/14/2022   Procedure: FIRST STAGE BRACHIOBASILIC LEFT ARM ARTERIOVENOUS (AV) FISTULA CREATION;  Surgeon: Cephus Shelling, MD;  Location: [facility] OR;  Service: Vascular;  Laterality: Left;   BASCILIC VEIN TRANSPOSITION Left 10/30/2022   Procedure: LEFT ARM SECOND STAGE BASILIC VEIN TRANSPOSITION;  Surgeon: Cephus Shelling, MD;  Location: [facility] OR;  Service: Vascular;  Laterality: Left;   Eyelid surgery Right    I & D EXTREMITY  11/11/2011   Procedure: IRRIGATION AND DEBRIDEMENT EXTREMITY;  Surgeon: Robyne Askew, MD;  Location: [facility] OR;  Service: General;  Laterality: Right;  I&D Right Thigh Abscess   INSERTION OF DIALYSIS CATHETER N/A 07/25/2022   Procedure: INSERTION OF DIALYSIS CATHETER;  Surgeon: Cephus Shelling, MD;  Location: [facility] OR;  Service: Vascular;  Laterality: N/A;   IR FLUORO GUIDE CV LINE RIGHT  08/12/2019   IR US GUIDE VASC ACCESS RIGHT  08/12/2019   LIGATION OF ARTERIOVENOUS  FISTULA Left 08/14/2022   Procedure: LIGATION OF BRACHIOCEPHALIC ARTERIOVENOUS  FISTULA;  Surgeon: Cephus Shelling, MD;  Location: [facility] OR;  Service: Vascular;  Laterality: Left;   REVISON OF ARTERIOVENOUS FISTULA Left 03/23/2020   Procedure: LEFT ARM ARTERIOVENOUS FISTULA REVISON, LIGATION  OF SIDE BRANCHES AND ELEVATION;  Surgeon: Cephus Shelling, MD;  Location: [facility] OR;  Service: Vascular;  Laterality: Left;    Current Outpatient Medications  Medication Sig Dispense Refill   amLODipine (NORVASC) 2.5 MG tablet Take 2.5 mg by mouth at bedtime.     aspirin EC 81 MG tablet Take 1 tablet (81 mg total) by mouth daily. 90 tablet 0   atorvastatin (LIPITOR) 40 MG tablet Take 1 tablet (40 mg total) by mouth daily. 90 tablet 3   baclofen (LIORESAL) 10 MG tablet Take 1 tablet (10 mg total) by mouth at  bedtime. 30 tablet 6   benzonatate (TESSALON) 100 MG capsule Take 1 capsule (100 mg total) by mouth 3 (three) times daily as needed for cough. 21 capsule 0   Blood Glucose Monitoring Suppl (CONTOUR NEXT EZ) w/Device KIT Use to check blood sugar three times daily. 1 kit 0   Blood Pressure Monitoring (BLOOD PRESSURE DIGITAL SOLN) KIT Use to check blood pressure daily. 1 kit 0   diclofenac Sodium (VOLTAREN) 1 % GEL Apply 4 g topically 4 (four) times daily. 200 g 1   enalapril (VASOTEC) 5 MG tablet Take 1 tablet (5 mg total) by mouth daily. 90 tablet 1   gabapentin (NEURONTIN) 100 MG capsule Take 1 capsule (100 mg total) by mouth 3 (three) times a week. Take on HD days after HD (hemodialysis) and in the evenings. 90 capsule 3   glucose blood (CONTOUR NEXT TEST) test strip Use to check blood sugar three times daily. 100 each 12   Lancet Devices (MICROLET NEXT LANCING DEVICE) MISC 1 Device by Does not apply route 3 (three) times daily. 1 each 11   Methoxy PEG-Epoetin Beta (MIRCERA IJ) Mircera     Microlet Lancets MISC Use to check blood sugar three times daily. E11.65 100 each 3   pantoprazole (PROTONIX) 40 MG tablet Take 1 tablet (40 mg total) by mouth daily before breakfast. 90 tablet 1   SENSIPAR 30 MG tablet Take 60 mg by mouth 3 (three) times a week.     VELPHORO 500 MG chewable tablet Chew 500 mg by mouth 3 (three) times daily with meals.     Vitamin D, Ergocalciferol, (DRISDOL) 1.25 MG (50000 UNIT) CAPS capsule Take 1 capsule (50,000 Units total) by mouth once a week. 12 capsule 0   Current Facility-Administered Medications  Medication Dose Route Frequency Provider Last Rate Last Admin   0.9 %  sodium chloride infusion  250 mL Intravenous PRN Cephus Shelling, MD       sodium chloride flush (NS) 0.9 % injection 3 mL  3 mL Intravenous Q12H Cephus Shelling, MD        Allergies as of 09/04/2023 - Review Complete 09/04/2023  Allergen Reaction Noted   Amlodipine Other (See Comments)  12/20/2019    Family History  Problem Relation Age of Onset   Diabetes Mother    Heart disease Mother 53   Diabetes Sister    Diabetes Brother    Diabetes Brother    Cancer Maternal Uncle        type unknown   Seizures Niece    Stroke Neg Hx     Social History   Socioeconomic History   Marital status: Significant Other    Spouse name: Not on file   Number of children: 3   Years of education: Not on file   Highest education level: Not on file  Occupational History   Occupation: Estate manager/land agent  Occupation: Disablity  Tobacco Use   Smoking status: Never    Passive exposure: Never   Smokeless tobacco: Never  Vaping Use   Vaping status: Never Used  Substance and Sexual Activity   Alcohol use: Not Currently   Drug use: No   Sexual activity: Yes  Other Topics Concern   Not on file  Social History Narrative   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  Did not graduate high school.  Has 3 adult children in [place].  Divorced.  Lives with girlfriend Emilio Math).                   Social Drivers of Health   Financial Resource Strain: Medium Risk (08/07/2023)   Received from [facility]   Overall Financial Resource Strain (CARDIA)    Difficulty of Paying Living Expenses: Somewhat hard  Food Insecurity: No Food Insecurity (08/07/2023)   Hunger Vital Sign    Worried About Running Out of Food in the Last Year: Never true    Ran Out of Food in the Last Year: Never true  Recent Concern: Food Insecurity - Food Insecurity Present (07/08/2023)   Hunger Vital Sign    Worried About Running Out of Food in the Last Year: Sometimes true    Ran Out of Food in the Last Year: Sometimes true  Transportation Needs: No Transportation Needs (08/07/2023)   PRAPARE - Administrator, Civil Service (Medical): No    Lack of Transportation (Non-Medical): No  Physical Activity: Inactive (01/01/2023)   Exercise Vital Sign    Days  of Exercise per Week: 0 days    Minutes of Exercise per Session: 0 min  Stress: Stress Concern Present (01/01/2023)   [employer] of Occupational Health - Occupational Stress Questionnaire    Feeling of Stress : To some extent  Social Connections: Moderately Integrated (01/01/2023)   Social Connection and Isolation Panel [NHANES]    Frequency of Communication with Friends and Family: Three times a week    Frequency of Social Gatherings with Friends and Family: Three times a week    Attends Religious Services: Never    Active Member of Clubs or Organizations: No    Attends Engineer, structural: More than 4 times per year    Marital Status: Living with partner  Recent Concern: Social Connections - Socially Isolated (10/24/2022)   Social Connection and Isolation Panel [NHANES]    Frequency of Communication with Friends and Family: Three times a week    Frequency of Social Gatherings with Friends and Family: Three times a week    Attends Religious Services: Never    Active Member of Clubs or Organizations: No    Attends Banker Meetings: Never    Marital Status: Divorced  Catering manager Violence: Not At Risk (08/07/2023)   Humiliation, Afraid, Rape, and Kick questionnaire    Fear of Current or Ex-Partner: No    Emotionally Abused: No    Physically Abused: No    Sexually Abused: No    Review of Systems:    Constitutional: No weight loss, fever, chills, weakness or fatigue HEENT: Eyes: No change in vision               Ears, Nose, Throat:  No change in hearing or congestion Skin: No rash or itching Cardiovascular: No chest pain, chest pressure or palpitations   Respiratory: No SOB or cough Gastrointestinal: See HPI and otherwise negative Genitourinary: No dysuria  or change in urinary frequency Neurological: No headache, dizziness or syncope Musculoskeletal: No new muscle or joint pain Hematologic: No bleeding or bruising Psychiatric: No history of  depression or anxiety    Physical Exam:  Vital signs: BP (!) 140/74 (BP Location: Right Arm, Patient Position: Sitting, Cuff Size: Normal)   Pulse 83   Ht 5\' 8"  (1.727 m)   Wt 251 lb (113.9 kg)   SpO2 97%   BMI 38.16 kg/m   Constitutional: NAD, Well developed, Well nourished, alert and cooperative Head:  Normocephalic and atraumatic. Eyes:   PEERL, EOMI. No icterus. Conjunctiva pink. Respiratory: Respirations even and unlabored. Lungs clear to auscultation bilaterally.   No wheezes, crackles, or rhonchi.  Cardiovascular:  Regular rate and rhythm. No peripheral edema, cyanosis or pallor.  Gastrointestinal:  Soft, nondistended, nontender. No rebound or guarding. Normal bowel sounds. No appreciable masses or hepatomegaly. Rectal:  Not performed.  Msk:  Symmetrical without gross deformities. Without edema, no deformity or joint abnormality.  Neurologic:  Alert and  oriented x4;  grossly normal neurologically.  Skin:   Dry and intact without significant lesions or rashes. Psychiatric: Oriented to person, place and time. Demonstrates good judgement and reason without abnormal affect or behaviors.   RELEVANT LABS AND IMAGING: CBC    Component Value Date/Time   WBC 7.7 03/12/2023 0952   RBC 4.59 03/12/2023 0952   HGB 11.8 (L) 03/12/2023 0952   HGB 14.3 02/04/2022 1129   HCT 37.2 (L) 03/12/2023 0952   HCT 43.7 02/04/2022 1129   PLT 292 03/12/2023 0952   PLT 227 02/04/2022 1129   MCV 81.0 03/12/2023 0952   MCV 79 02/04/2022 1129   MCH 25.7 (L) 03/12/2023 0952   MCHC 31.7 03/12/2023 0952   RDW 14.6 03/12/2023 0952   RDW 14.4 02/04/2022 1129   LYMPHSABS 1.3 02/04/2022 1129   MONOABS 1.6 (H) 09/07/2021 1016   EOSABS 0.3 02/04/2022 1129   BASOSABS 0.1 02/04/2022 1129    CMP     Component Value Date/Time   NA 138 03/12/2023 0952   NA 137 02/04/2022 1129   K 3.9 03/12/2023 0952   CL 90 (L) 03/12/2023 0952   CO2 31 03/12/2023 0952   GLUCOSE 116 (H) 03/12/2023 0952   BUN 17  03/12/2023 0952   BUN 24 02/04/2022 1129   CREATININE 5.87 (H) 03/12/2023 0952   CREATININE 2.77 (H) 05/31/2016 0844   CALCIUM 8.7 (L) 03/12/2023 0952   PROT 7.5 02/04/2022 1129   ALBUMIN 4.4 02/04/2022 1129   AST 15 02/04/2022 1129   ALT 15 02/04/2022 1129   ALKPHOS 81 02/04/2022 1129   BILITOT 0.3 02/04/2022 1129   GFRNONAA 11 (L) 03/12/2023 0952   GFRNONAA 26 (L) 05/31/2016 0844   GFRAA 15 (L) 08/18/2020 0925   GFRAA 30 (L) 05/31/2016 0844     Assessment/Plan:   Screening for colon cancer ESRD on HD No previous screening colonoscopy.  Colonoscopy will have to be done at the hospital due to his history of ESRD on HD.  His days are Monday Wednesday Friday.  Dr. Marina Goodell only has hospital availability on a dialysis day so we will put him with another provider on a nondialysis day.  Last labs 03/12/2023.  Patient declined labs today. - Schedule colonoscopy - I thoroughly discussed the procedure with the patient (at bedside) to include nature of the procedure, alternatives, benefits, and risks (including but not limited to bleeding, infection, perforation, anesthesia/cardiac pulmonary complications).  Patient verbalized understanding and gave verbal  consent to proceed with procedure.  Constipation Intermittent constipation not adequately controlled with stool softeners.  No unintentional weight loss or rectal bleeding.  Normal TSH - MiraLAX 1 capful daily adjust dose based on response - Patient declined labs today  Normocytic anemia Normocytic anemia seen on CBC August 2024 with Hgb 11.8, MCV 81.0.  No overt bleeding.  Normocytic anemia likely in the setting of ESRD.  Patient declined labs today   [facility] 09/04/2023, 9:38 AM  Cc: Claiborne Rigg, NP

## 2023-09-05 DIAGNOSIS — N186 End stage renal disease: Secondary | ICD-10-CM | POA: Diagnosis not present

## 2023-09-05 DIAGNOSIS — Z0289 Encounter for other administrative examinations: Secondary | ICD-10-CM

## 2023-09-05 DIAGNOSIS — N2581 Secondary hyperparathyroidism of renal origin: Secondary | ICD-10-CM | POA: Diagnosis not present

## 2023-09-05 DIAGNOSIS — D631 Anemia in chronic kidney disease: Secondary | ICD-10-CM | POA: Diagnosis not present

## 2023-09-05 DIAGNOSIS — Z992 Dependence on renal dialysis: Secondary | ICD-10-CM | POA: Diagnosis not present

## 2023-09-05 DIAGNOSIS — R739 Hyperglycemia, unspecified: Secondary | ICD-10-CM | POA: Diagnosis not present

## 2023-09-05 DIAGNOSIS — E1129 Type 2 diabetes mellitus with other diabetic kidney complication: Secondary | ICD-10-CM | POA: Diagnosis not present

## 2023-09-05 DIAGNOSIS — D509 Iron deficiency anemia, unspecified: Secondary | ICD-10-CM | POA: Diagnosis not present

## 2023-09-08 ENCOUNTER — Ambulatory Visit (INDEPENDENT_AMBULATORY_CARE_PROVIDER_SITE_OTHER): Payer: Medicare Other | Admitting: Podiatry

## 2023-09-08 DIAGNOSIS — B351 Tinea unguium: Secondary | ICD-10-CM

## 2023-09-08 DIAGNOSIS — M79675 Pain in left toe(s): Secondary | ICD-10-CM | POA: Diagnosis not present

## 2023-09-08 DIAGNOSIS — M79674 Pain in right toe(s): Secondary | ICD-10-CM | POA: Diagnosis not present

## 2023-09-08 NOTE — Progress Notes (Unsigned)
    Subjective:  Patient ID: Jeremy Jarvis., male    DOB: Apr 19, 1966,  MRN: [id number]  Jeremy Jarvis. presents to clinic today for:  Chief Complaint  Patient presents with   dfc    Nail trim    Diabetic patient with end-stage renal disease (on dialysis) presents noting his nails are thick, discolored, elongated and painful in shoegear when trying to ambulate.    PCP is Claiborne Rigg, NP.  Past Medical History:  Diagnosis Date   Abscess    AKI (acute kidney injury) (HCC) 08/05/2017   Bell's palsy    right eye abn since dx bell's palsy   CKD (chronic kidney disease)    Dr. McDiarmind    COVID 2020   mild   Dyspnea    occasional with exertion   ESRD (end stage renal disease) (HCC)    MWF -[facility]   Headache    migraines   High cholesterol    Hypertension    Left shoulder pain 10/26/2013   S/p injection on 10/26/13    Renal insufficiency 02/14/2014   Seizures (HCC) 08/04/2017   "related to high blood sugars" (08/05/2017)   Type II diabetes mellitus (HCC)     Allergies  Allergen Reactions   Amlodipine Other (See Comments)    Dizziness    Review of Systems: Negative except as noted in the HPI.  Objective:  Jeremy Pafford. is a pleasant 58 y.o. male in NAD. AAO x 3.  Vascular Examination: Capillary refill time is 3-5 seconds to toes bilateral. Palpable pedal pulses b/l LE. Digital hair present b/l.  Skin temperature gradient WNL b/l. No varicosities b/l. No cyanosis noted b/l.   Dermatological Examination: Pedal skin with normal turgor, texture and tone b/l. No open wounds. No interdigital macerations b/l. Toenails x10 are 3mm thick, discolored, dystrophic with subungual debris. There is pain with compression of the nail plates.  They are elongated x10     Latest Ref Rng & Units 06/24/2023   10:43 AM 03/11/2023    9:04 AM 12/05/2022    8:49 AM  Hemoglobin A1C  Hemoglobin-A1c 0.0 - 7.0 % 5.6  6.8  7.4    Assessment/Plan: 1. Pain due to onychomycosis  of toenails of both feet     The mycotic toenails were sharply debrided x10 with sterile nail nippers and a power debriding burr to decrease bulk/thickness and length.    Return in about 3 months (around 12/06/2023) for [facility].   Clerance Lav, DPM, FACFAS [facility]     [address] [place], [place] [address]                Office [phone]  Fax [phone]

## 2023-09-09 ENCOUNTER — Telehealth: Payer: Self-pay | Admitting: Gastroenterology

## 2023-09-09 NOTE — Telephone Encounter (Signed)
Inbound call from patient stating that he has not received medication that he was advised that would be sent in on 2/13. Requesting a call back. Please advise, thank you.

## 2023-09-10 DIAGNOSIS — D631 Anemia in chronic kidney disease: Secondary | ICD-10-CM | POA: Diagnosis not present

## 2023-09-10 DIAGNOSIS — D509 Iron deficiency anemia, unspecified: Secondary | ICD-10-CM | POA: Diagnosis not present

## 2023-09-10 DIAGNOSIS — Z992 Dependence on renal dialysis: Secondary | ICD-10-CM | POA: Diagnosis not present

## 2023-09-10 DIAGNOSIS — R739 Hyperglycemia, unspecified: Secondary | ICD-10-CM | POA: Diagnosis not present

## 2023-09-10 DIAGNOSIS — N186 End stage renal disease: Secondary | ICD-10-CM | POA: Diagnosis not present

## 2023-09-10 DIAGNOSIS — E1129 Type 2 diabetes mellitus with other diabetic kidney complication: Secondary | ICD-10-CM | POA: Diagnosis not present

## 2023-09-10 DIAGNOSIS — N2581 Secondary hyperparathyroidism of renal origin: Secondary | ICD-10-CM | POA: Diagnosis not present

## 2023-09-10 NOTE — Telephone Encounter (Signed)
Completing  DMV form for pt.  Will finish at the office. To be signed.

## 2023-09-11 NOTE — Telephone Encounter (Signed)
Inbound call from patient, following up on request below. States he is awaiting his medication that was discussed on his visit with [facility].

## 2023-09-11 NOTE — Telephone Encounter (Signed)
No other prescription medications were sent to his pharmacy. Called patient to clarify. Patient states he was told to start something to help him have a bowel movement. Informed patient that on his patient instructions Deanna wanted patient to start over the counter Miralax mixing 17 grams daily x 1 week and titrate up if ineffective. Informed patient that this is not a prescription. Patient verbalized understanding.

## 2023-09-11 NOTE — Telephone Encounter (Signed)
DMV form to MM/NP for review and signature.

## 2023-09-11 NOTE — Telephone Encounter (Signed)
I called pt.  He is taking keppra daily.  (He has 500mg  tablets).  I did not see on his med list.   He has not had any seizures.  Uses [facility] as pharmacy.  I relayed that not taking medication as prescribed he can have a seizure.  He is aware.

## 2023-09-11 NOTE — Telephone Encounter (Signed)
This is [facility]. Please route to pool for [facility]. I did on 09/09/23.

## 2023-09-12 ENCOUNTER — Other Ambulatory Visit: Payer: Self-pay | Admitting: Orthopaedic Surgery

## 2023-09-12 ENCOUNTER — Telehealth: Payer: Self-pay

## 2023-09-12 DIAGNOSIS — N2581 Secondary hyperparathyroidism of renal origin: Secondary | ICD-10-CM | POA: Diagnosis not present

## 2023-09-12 DIAGNOSIS — R739 Hyperglycemia, unspecified: Secondary | ICD-10-CM | POA: Diagnosis not present

## 2023-09-12 DIAGNOSIS — N186 End stage renal disease: Secondary | ICD-10-CM | POA: Diagnosis not present

## 2023-09-12 DIAGNOSIS — E1129 Type 2 diabetes mellitus with other diabetic kidney complication: Secondary | ICD-10-CM | POA: Diagnosis not present

## 2023-09-12 DIAGNOSIS — Z992 Dependence on renal dialysis: Secondary | ICD-10-CM | POA: Diagnosis not present

## 2023-09-12 DIAGNOSIS — D509 Iron deficiency anemia, unspecified: Secondary | ICD-10-CM | POA: Diagnosis not present

## 2023-09-12 DIAGNOSIS — D631 Anemia in chronic kidney disease: Secondary | ICD-10-CM | POA: Diagnosis not present

## 2023-09-12 DIAGNOSIS — M48061 Spinal stenosis, lumbar region without neurogenic claudication: Secondary | ICD-10-CM

## 2023-09-12 DIAGNOSIS — M4316 Spondylolisthesis, lumbar region: Secondary | ICD-10-CM

## 2023-09-12 NOTE — Telephone Encounter (Signed)
Patient identified by name and date of birth.  Patient is aware of forms that have been completed and faxed.  Original forms at front desk waiting for pick up.

## 2023-09-15 ENCOUNTER — Ambulatory Visit: Payer: Medicare Other | Admitting: Podiatry

## 2023-09-15 DIAGNOSIS — N2581 Secondary hyperparathyroidism of renal origin: Secondary | ICD-10-CM | POA: Diagnosis not present

## 2023-09-15 DIAGNOSIS — D631 Anemia in chronic kidney disease: Secondary | ICD-10-CM | POA: Diagnosis not present

## 2023-09-15 DIAGNOSIS — Z992 Dependence on renal dialysis: Secondary | ICD-10-CM | POA: Diagnosis not present

## 2023-09-15 DIAGNOSIS — R739 Hyperglycemia, unspecified: Secondary | ICD-10-CM | POA: Diagnosis not present

## 2023-09-15 DIAGNOSIS — E1129 Type 2 diabetes mellitus with other diabetic kidney complication: Secondary | ICD-10-CM | POA: Diagnosis not present

## 2023-09-15 DIAGNOSIS — N186 End stage renal disease: Secondary | ICD-10-CM | POA: Diagnosis not present

## 2023-09-15 DIAGNOSIS — D509 Iron deficiency anemia, unspecified: Secondary | ICD-10-CM | POA: Diagnosis not present

## 2023-09-15 NOTE — Telephone Encounter (Signed)
Given to medical records

## 2023-09-15 NOTE — Telephone Encounter (Signed)
 signed

## 2023-09-16 ENCOUNTER — Encounter: Payer: Self-pay | Admitting: Orthopaedic Surgery

## 2023-09-16 ENCOUNTER — Ambulatory Visit: Payer: Medicare Other | Attending: Nurse Practitioner | Admitting: Nurse Practitioner

## 2023-09-16 DIAGNOSIS — I1A0 Resistant hypertension: Secondary | ICD-10-CM

## 2023-09-16 NOTE — Progress Notes (Signed)
 Patient states he does not need appt. It was scheduled erroneously

## 2023-09-17 DIAGNOSIS — D509 Iron deficiency anemia, unspecified: Secondary | ICD-10-CM | POA: Diagnosis not present

## 2023-09-17 DIAGNOSIS — E1129 Type 2 diabetes mellitus with other diabetic kidney complication: Secondary | ICD-10-CM | POA: Diagnosis not present

## 2023-09-17 DIAGNOSIS — D631 Anemia in chronic kidney disease: Secondary | ICD-10-CM | POA: Diagnosis not present

## 2023-09-17 DIAGNOSIS — N186 End stage renal disease: Secondary | ICD-10-CM | POA: Diagnosis not present

## 2023-09-17 DIAGNOSIS — N2581 Secondary hyperparathyroidism of renal origin: Secondary | ICD-10-CM | POA: Diagnosis not present

## 2023-09-17 DIAGNOSIS — Z992 Dependence on renal dialysis: Secondary | ICD-10-CM | POA: Diagnosis not present

## 2023-09-17 DIAGNOSIS — R739 Hyperglycemia, unspecified: Secondary | ICD-10-CM | POA: Diagnosis not present

## 2023-09-17 DIAGNOSIS — G5603 Carpal tunnel syndrome, bilateral upper limbs: Secondary | ICD-10-CM | POA: Diagnosis not present

## 2023-09-18 DIAGNOSIS — G5603 Carpal tunnel syndrome, bilateral upper limbs: Secondary | ICD-10-CM | POA: Diagnosis not present

## 2023-09-19 DIAGNOSIS — D509 Iron deficiency anemia, unspecified: Secondary | ICD-10-CM | POA: Diagnosis not present

## 2023-09-19 DIAGNOSIS — H524 Presbyopia: Secondary | ICD-10-CM | POA: Diagnosis not present

## 2023-09-19 DIAGNOSIS — N186 End stage renal disease: Secondary | ICD-10-CM | POA: Diagnosis not present

## 2023-09-19 DIAGNOSIS — Z992 Dependence on renal dialysis: Secondary | ICD-10-CM | POA: Diagnosis not present

## 2023-09-19 DIAGNOSIS — N2581 Secondary hyperparathyroidism of renal origin: Secondary | ICD-10-CM | POA: Diagnosis not present

## 2023-09-19 DIAGNOSIS — E1129 Type 2 diabetes mellitus with other diabetic kidney complication: Secondary | ICD-10-CM | POA: Diagnosis not present

## 2023-09-19 DIAGNOSIS — R739 Hyperglycemia, unspecified: Secondary | ICD-10-CM | POA: Diagnosis not present

## 2023-09-19 DIAGNOSIS — I129 Hypertensive chronic kidney disease with stage 1 through stage 4 chronic kidney disease, or unspecified chronic kidney disease: Secondary | ICD-10-CM | POA: Diagnosis not present

## 2023-09-19 DIAGNOSIS — D631 Anemia in chronic kidney disease: Secondary | ICD-10-CM | POA: Diagnosis not present

## 2023-09-22 DIAGNOSIS — Z111 Encounter for screening for respiratory tuberculosis: Secondary | ICD-10-CM | POA: Diagnosis not present

## 2023-09-22 DIAGNOSIS — R739 Hyperglycemia, unspecified: Secondary | ICD-10-CM | POA: Diagnosis not present

## 2023-09-22 DIAGNOSIS — N2581 Secondary hyperparathyroidism of renal origin: Secondary | ICD-10-CM | POA: Diagnosis not present

## 2023-09-22 DIAGNOSIS — Z992 Dependence on renal dialysis: Secondary | ICD-10-CM | POA: Diagnosis not present

## 2023-09-22 DIAGNOSIS — D509 Iron deficiency anemia, unspecified: Secondary | ICD-10-CM | POA: Diagnosis not present

## 2023-09-22 DIAGNOSIS — D631 Anemia in chronic kidney disease: Secondary | ICD-10-CM | POA: Diagnosis not present

## 2023-09-22 DIAGNOSIS — N186 End stage renal disease: Secondary | ICD-10-CM | POA: Diagnosis not present

## 2023-09-23 ENCOUNTER — Encounter: Payer: Self-pay | Admitting: Pharmacist

## 2023-09-23 ENCOUNTER — Ambulatory Visit: Payer: Medicare Other | Attending: Nurse Practitioner | Admitting: Pharmacist

## 2023-09-23 VITALS — BP 138/71 | HR 71

## 2023-09-23 DIAGNOSIS — I1A0 Resistant hypertension: Secondary | ICD-10-CM

## 2023-09-23 DIAGNOSIS — E1151 Type 2 diabetes mellitus with diabetic peripheral angiopathy without gangrene: Secondary | ICD-10-CM

## 2023-09-23 LAB — POCT GLYCOSYLATED HEMOGLOBIN (HGB A1C): HbA1c, POC (controlled diabetic range): 5.7 % (ref 0.0–7.0)

## 2023-09-23 NOTE — Progress Notes (Signed)
 S:     No chief complaint on file.  58 y.o. male who presents for diabetes evaluation, education, and management.  PMH is significant for T2DM w/ ESRD (MWF HD), anemia of chronic disease, resistant HTN, OSA, HLD, obesity, epilepsy. Patient was originally referred by Zelda on 06/16/2023. She last saw him on 08/18/2023 and 08/26/23. BP was 126/68 at that visit on 08/26/23.  I've been involved in his care since 09/2021 and last saw him in 06/24/2023. BP at that visit was 134/73 mmHg. A1c was 5.6%. I did not make any DM medication changes. He is currently managing DM without medication. Regarding his HTN, his BP fluctuates d/t HD. For this reason, it is difficult to adjust his antihypertensives.Today, patient arrives in good spirits and presents without any assistance. He has no complaints. He is checking blood glucose via finger stick and prefers not to use libre at this time. He brings his meter for review. A1c today is 5.7%!  Family/Social History:  -Fhx: DM, heart disease, seizure disorder, stroke -Tobacco: never smoker  -Alcohol: none reported   Current diabetes medications include: none Current hypertension medications include: amlodipine 5mg  daily, enalapril 5 mg (only taking on non-dialysis days) Current HLD medications include: atorvastatin 40 mg daily  Insurance coverage: BCBS Medicare, [employer] Medicaid  Patient denies nocturia (nighttime urination).  Patient reports neuropathy (nerve pain). Patient denies visual changes. Patient reports self foot exams.   Patient reported no changes from previous dietary habits:  -Adherent to sodium restriction. -Has stopped drinking regular Pepsi and Mt. Dew daily.  -Limits carbohydrates and tries to avoid sweets.  Patient-reported exercise habits: walking and yard work  O:   Lab Results  Component Value Date   HGBA1C 5.7 09/23/2023   Vitals:   09/23/23 0919  BP: 138/71  Pulse: 71    Lipid Panel     Component Value  Date/Time   CHOL 261 (H) 02/04/2022 1129   TRIG 222 (H) 02/04/2022 1129   HDL 54 02/04/2022 1129   CHOLHDL 4.8 02/04/2022 1129   CHOLHDL 3.1 02/09/2016 1045   VLDL 11 02/09/2016 1045   LDLCALC 166 (H) 02/04/2022 1129   Clinical Atherosclerotic Cardiovascular Disease (ASCVD): No  The 10-year ASCVD risk score (Arnett DK, et al., 2019) is: 24.6%   Values used to calculate the score:     Age: 75 years     Sex: Male     Is Non-Hispanic African American: Yes     Diabetic: Yes     Tobacco smoker: No     Systolic Blood Pressure: 138 mmHg     Is BP treated: Yes     HDL Cholesterol: 54 mg/dL     Total Cholesterol: 261 mg/dL   A/P: Diabetes longstanding currently at goal. Home sugars are at goal. Patient is able to verbalize appropriate hypoglycemia management plan but is not having hypoglycemia at this time. He has not taken insulin in at least the last several months. With his ESRD/HD hx, we will get a fructosamine as A1c can be falsely low in this setting. -Continue management with lifestyle alone. -Extensively discussed pathophysiology of diabetes, recommended lifestyle interventions, dietary effects on blood sugar control.  -Counseled on s/sx of and management of hypoglycemia.  -Next A1c anticipated 12/2023. -BMP, fructosamine, lipid, hepatic function  Hypertension longstanding currently close to goal. Blood pressure goal of <130/80 mmHg. BP management is difficult with his HD. Patient took amlodipine and enalapril today.  -Continue enalapril 5 mg daily on non-HD -Continue  amlodipine 5 mg daily on non-HD days   Written patient instructions provided. Patient verbalized understanding of treatment plan.  Total time in face to face counseling 20 minutes.    Follow-up:  Pharmacist prn. PCP clinic visit 11/18/2023.  Butch Penny, PharmD, [name], CPP Clinical Pharmacist [facility] & [facility] [phone]

## 2023-09-24 ENCOUNTER — Telehealth: Payer: Self-pay | Admitting: Pharmacist

## 2023-09-24 DIAGNOSIS — Z111 Encounter for screening for respiratory tuberculosis: Secondary | ICD-10-CM | POA: Diagnosis not present

## 2023-09-24 DIAGNOSIS — N186 End stage renal disease: Secondary | ICD-10-CM | POA: Diagnosis not present

## 2023-09-24 DIAGNOSIS — D631 Anemia in chronic kidney disease: Secondary | ICD-10-CM | POA: Diagnosis not present

## 2023-09-24 DIAGNOSIS — Z992 Dependence on renal dialysis: Secondary | ICD-10-CM | POA: Diagnosis not present

## 2023-09-24 DIAGNOSIS — N2581 Secondary hyperparathyroidism of renal origin: Secondary | ICD-10-CM | POA: Diagnosis not present

## 2023-09-24 DIAGNOSIS — D509 Iron deficiency anemia, unspecified: Secondary | ICD-10-CM | POA: Diagnosis not present

## 2023-09-24 DIAGNOSIS — R739 Hyperglycemia, unspecified: Secondary | ICD-10-CM | POA: Diagnosis not present

## 2023-09-24 LAB — BASIC METABOLIC PANEL
BUN/Creatinine Ratio: 4 — ABNORMAL LOW (ref 9–20)
BUN: 36 mg/dL — ABNORMAL HIGH (ref 6–24)
CO2: 28 mmol/L (ref 20–29)
Calcium: 9.5 mg/dL (ref 8.7–10.2)
Chloride: 96 mmol/L (ref 96–106)
Creatinine, Ser: 8.02 mg/dL — ABNORMAL HIGH (ref 0.76–1.27)
Glucose: 98 mg/dL (ref 70–99)
Potassium: 4.3 mmol/L (ref 3.5–5.2)
Sodium: 142 mmol/L (ref 134–144)
eGFR: 7 mL/min/{1.73_m2} — ABNORMAL LOW (ref >59)

## 2023-09-24 LAB — LIPID PANEL
Chol/HDL Ratio: 3.5 ratio (ref 0.0–5.0)
Cholesterol, Total: 159 mg/dL (ref 100–199)
HDL: 46 mg/dL (ref >39)
LDL Chol Calc (NIH): 98 mg/dL (ref 0–99)
Triglycerides: 77 mg/dL (ref 0–149)
VLDL Cholesterol Cal: 15 mg/dL (ref 5–40)

## 2023-09-24 LAB — HEPATIC FUNCTION PANEL
ALT: 12 IU/L (ref 0–44)
AST: 12 IU/L (ref 0–40)
Albumin: 4.1 g/dL (ref 3.8–4.9)
Alkaline Phosphatase: 78 IU/L (ref 44–121)
Bilirubin Total: 0.4 mg/dL (ref 0.0–1.2)
Bilirubin, Direct: 0.16 mg/dL (ref 0.00–0.40)
Total Protein: 7.1 g/dL (ref 6.0–8.5)

## 2023-09-24 LAB — FRUCTOSAMINE: Fructosamine: 297 umol/L — ABNORMAL HIGH (ref 0–285)

## 2023-09-24 NOTE — Telephone Encounter (Signed)
 Hey friends,   I wanted to provide some perspective for ordering some of Mr. Premo labs. He is an ESRD pt on HD. He has a hx of anemia of chronic disease (the ESRD in this case). For this reason, the A1c can be falsely low.   His A1c has been excellent off of medication now for 6+ months. For this reason, I ordered a fructosamine to give [place] a more accurate estimation of his glycemic control. His result is 297, and when using this to calculate a corresponding A1c and glucose respectively, we get ~6.6% and 133 mg/dL. His glucose levels at home have been in the low 100s-130s and correlates with what I calculated.   The above is proof that his A1c was a little underestimated. Instead of a 5.7%, he is actually closer to a 6.6%. However, this still supports that his DM is well-controlled. I checked a liver function panel to make sure his albumin was nl and it is. Therefore, the fructosamine calculation is accurate.   His other labs look good. LDL is 100mg /dL, which is a good improvement from 166 prior. However, I'd recommend a LDL goal of 70mg /dL in him. He's improving, so I'd recommend to continue his current statin for now.   Renal function expected in the setting of ESRD/HD, however, electrolyte status looks good.   I'm really pleased with this fella's progress. DM and BP are under better control. He is a [facility] patient as well. So this also contributes to improving our quality measures. Thank y'all for including me! Again, just wanted to provide some perspective if these results ended up in your in-baskets.  Butch Penny, PharmD, [name], CPP Clinical Pharmacist [facility] & [facility] [phone]

## 2023-09-24 NOTE — Telephone Encounter (Signed)
 Thank you Franky Macho for your more than impressive clinical work up of Mr. Westberg!!! Your diligence is greatly appreciated

## 2023-09-25 DIAGNOSIS — K080 Exfoliation of teeth due to systemic causes: Secondary | ICD-10-CM | POA: Diagnosis not present

## 2023-09-26 DIAGNOSIS — D509 Iron deficiency anemia, unspecified: Secondary | ICD-10-CM | POA: Diagnosis not present

## 2023-09-26 DIAGNOSIS — D631 Anemia in chronic kidney disease: Secondary | ICD-10-CM | POA: Diagnosis not present

## 2023-09-26 DIAGNOSIS — R739 Hyperglycemia, unspecified: Secondary | ICD-10-CM | POA: Diagnosis not present

## 2023-09-26 DIAGNOSIS — N186 End stage renal disease: Secondary | ICD-10-CM | POA: Diagnosis not present

## 2023-09-26 DIAGNOSIS — Z111 Encounter for screening for respiratory tuberculosis: Secondary | ICD-10-CM | POA: Diagnosis not present

## 2023-09-26 DIAGNOSIS — N2581 Secondary hyperparathyroidism of renal origin: Secondary | ICD-10-CM | POA: Diagnosis not present

## 2023-09-26 DIAGNOSIS — Z992 Dependence on renal dialysis: Secondary | ICD-10-CM | POA: Diagnosis not present

## 2023-09-27 ENCOUNTER — Ambulatory Visit
Admission: RE | Admit: 2023-09-27 | Discharge: 2023-09-27 | Disposition: A | Discharge status: Home / Self Care | Payer: Medicare Other | Source: Ambulatory Visit | Attending: Orthopaedic Surgery | Admitting: Orthopaedic Surgery

## 2023-09-27 DIAGNOSIS — M48061 Spinal stenosis, lumbar region without neurogenic claudication: Secondary | ICD-10-CM

## 2023-09-27 DIAGNOSIS — M4316 Spondylolisthesis, lumbar region: Secondary | ICD-10-CM

## 2023-09-27 DIAGNOSIS — M47816 Spondylosis without myelopathy or radiculopathy, lumbar region: Secondary | ICD-10-CM | POA: Diagnosis not present

## 2023-09-29 DIAGNOSIS — N186 End stage renal disease: Secondary | ICD-10-CM | POA: Diagnosis not present

## 2023-09-29 DIAGNOSIS — N2581 Secondary hyperparathyroidism of renal origin: Secondary | ICD-10-CM | POA: Diagnosis not present

## 2023-09-29 DIAGNOSIS — D509 Iron deficiency anemia, unspecified: Secondary | ICD-10-CM | POA: Diagnosis not present

## 2023-09-29 DIAGNOSIS — R739 Hyperglycemia, unspecified: Secondary | ICD-10-CM | POA: Diagnosis not present

## 2023-09-29 DIAGNOSIS — Z111 Encounter for screening for respiratory tuberculosis: Secondary | ICD-10-CM | POA: Diagnosis not present

## 2023-09-29 DIAGNOSIS — D631 Anemia in chronic kidney disease: Secondary | ICD-10-CM | POA: Diagnosis not present

## 2023-09-29 DIAGNOSIS — Z992 Dependence on renal dialysis: Secondary | ICD-10-CM | POA: Diagnosis not present

## 2023-10-01 ENCOUNTER — Other Ambulatory Visit: Payer: Self-pay

## 2023-10-01 DIAGNOSIS — E113293 Type 2 diabetes mellitus with mild nonproliferative diabetic retinopathy without macular edema, bilateral: Secondary | ICD-10-CM | POA: Diagnosis not present

## 2023-10-01 DIAGNOSIS — N186 End stage renal disease: Secondary | ICD-10-CM

## 2023-10-02 ENCOUNTER — Encounter: Payer: Medicare Other | Admitting: Internal Medicine

## 2023-10-02 DIAGNOSIS — M48062 Spinal stenosis, lumbar region with neurogenic claudication: Secondary | ICD-10-CM | POA: Diagnosis not present

## 2023-10-02 DIAGNOSIS — M4316 Spondylolisthesis, lumbar region: Secondary | ICD-10-CM | POA: Diagnosis not present

## 2023-10-02 DIAGNOSIS — M5416 Radiculopathy, lumbar region: Secondary | ICD-10-CM | POA: Diagnosis not present

## 2023-10-03 DIAGNOSIS — D509 Iron deficiency anemia, unspecified: Secondary | ICD-10-CM | POA: Diagnosis not present

## 2023-10-03 DIAGNOSIS — Z992 Dependence on renal dialysis: Secondary | ICD-10-CM | POA: Diagnosis not present

## 2023-10-03 DIAGNOSIS — R739 Hyperglycemia, unspecified: Secondary | ICD-10-CM | POA: Diagnosis not present

## 2023-10-03 DIAGNOSIS — N2581 Secondary hyperparathyroidism of renal origin: Secondary | ICD-10-CM | POA: Diagnosis not present

## 2023-10-03 DIAGNOSIS — Z111 Encounter for screening for respiratory tuberculosis: Secondary | ICD-10-CM | POA: Diagnosis not present

## 2023-10-03 DIAGNOSIS — N186 End stage renal disease: Secondary | ICD-10-CM | POA: Diagnosis not present

## 2023-10-03 DIAGNOSIS — D631 Anemia in chronic kidney disease: Secondary | ICD-10-CM | POA: Diagnosis not present

## 2023-10-06 DIAGNOSIS — Z992 Dependence on renal dialysis: Secondary | ICD-10-CM | POA: Diagnosis not present

## 2023-10-06 DIAGNOSIS — D631 Anemia in chronic kidney disease: Secondary | ICD-10-CM | POA: Diagnosis not present

## 2023-10-06 DIAGNOSIS — Z111 Encounter for screening for respiratory tuberculosis: Secondary | ICD-10-CM | POA: Diagnosis not present

## 2023-10-06 DIAGNOSIS — D509 Iron deficiency anemia, unspecified: Secondary | ICD-10-CM | POA: Diagnosis not present

## 2023-10-06 DIAGNOSIS — N186 End stage renal disease: Secondary | ICD-10-CM | POA: Diagnosis not present

## 2023-10-06 DIAGNOSIS — N2581 Secondary hyperparathyroidism of renal origin: Secondary | ICD-10-CM | POA: Diagnosis not present

## 2023-10-06 DIAGNOSIS — R739 Hyperglycemia, unspecified: Secondary | ICD-10-CM | POA: Diagnosis not present

## 2023-10-07 ENCOUNTER — Ambulatory Visit (HOSPITAL_COMMUNITY): Payer: Medicare Other

## 2023-10-07 ENCOUNTER — Ambulatory Visit: Payer: Medicare Other

## 2023-10-07 DIAGNOSIS — M5416 Radiculopathy, lumbar region: Secondary | ICD-10-CM | POA: Diagnosis not present

## 2023-10-08 ENCOUNTER — Telehealth: Payer: Self-pay | Admitting: Gastroenterology

## 2023-10-08 DIAGNOSIS — Z111 Encounter for screening for respiratory tuberculosis: Secondary | ICD-10-CM | POA: Diagnosis not present

## 2023-10-08 DIAGNOSIS — D631 Anemia in chronic kidney disease: Secondary | ICD-10-CM | POA: Diagnosis not present

## 2023-10-08 DIAGNOSIS — R739 Hyperglycemia, unspecified: Secondary | ICD-10-CM | POA: Diagnosis not present

## 2023-10-08 DIAGNOSIS — D509 Iron deficiency anemia, unspecified: Secondary | ICD-10-CM | POA: Diagnosis not present

## 2023-10-08 DIAGNOSIS — N2581 Secondary hyperparathyroidism of renal origin: Secondary | ICD-10-CM | POA: Diagnosis not present

## 2023-10-08 DIAGNOSIS — Z992 Dependence on renal dialysis: Secondary | ICD-10-CM | POA: Diagnosis not present

## 2023-10-08 DIAGNOSIS — N186 End stage renal disease: Secondary | ICD-10-CM | POA: Diagnosis not present

## 2023-10-08 NOTE — Telephone Encounter (Signed)
 Inbound call from patient stating he needs further assistance in understaning prep medication and instructions for 3/27 colonoscopy. Please advise, thank you.

## 2023-10-08 NOTE — Telephone Encounter (Signed)
 Called patient with no answer and voicemail box is full. Could not leave a message. Will attempt to contact patient again later.

## 2023-10-09 NOTE — Telephone Encounter (Signed)
Called patient with no answer and voicemail box is full. Could not leave a message. 

## 2023-10-09 NOTE — Telephone Encounter (Signed)
 Patient states he has misplaced his instructions for the colonoscopy. Patient states the instructions could be at his house but he is travelling in the car right now. He willgo home and check and call me back. Also, he states he has not got the prep kit for the procedure. Informed patient that I explained when he was in the office that he will receive a text from [facility] to set up delivery options to receive the Suflave prep kit. Instructed patient to contact [facility] and provided phone number. Also, informed patient if he cannot get through to [facility], they have chat box on the there website which was also provided to patient. Patient verbalized understanding. Patient will call me back if he cannot get his prep or find his prep instructions.

## 2023-10-10 DIAGNOSIS — N2581 Secondary hyperparathyroidism of renal origin: Secondary | ICD-10-CM | POA: Diagnosis not present

## 2023-10-10 DIAGNOSIS — Z992 Dependence on renal dialysis: Secondary | ICD-10-CM | POA: Diagnosis not present

## 2023-10-10 DIAGNOSIS — N186 End stage renal disease: Secondary | ICD-10-CM | POA: Diagnosis not present

## 2023-10-10 DIAGNOSIS — D509 Iron deficiency anemia, unspecified: Secondary | ICD-10-CM | POA: Diagnosis not present

## 2023-10-10 DIAGNOSIS — Z111 Encounter for screening for respiratory tuberculosis: Secondary | ICD-10-CM | POA: Diagnosis not present

## 2023-10-10 DIAGNOSIS — R739 Hyperglycemia, unspecified: Secondary | ICD-10-CM | POA: Diagnosis not present

## 2023-10-10 DIAGNOSIS — D631 Anemia in chronic kidney disease: Secondary | ICD-10-CM | POA: Diagnosis not present

## 2023-10-13 DIAGNOSIS — D631 Anemia in chronic kidney disease: Secondary | ICD-10-CM | POA: Diagnosis not present

## 2023-10-13 DIAGNOSIS — Z111 Encounter for screening for respiratory tuberculosis: Secondary | ICD-10-CM | POA: Diagnosis not present

## 2023-10-13 DIAGNOSIS — N186 End stage renal disease: Secondary | ICD-10-CM | POA: Diagnosis not present

## 2023-10-13 DIAGNOSIS — R739 Hyperglycemia, unspecified: Secondary | ICD-10-CM | POA: Diagnosis not present

## 2023-10-13 DIAGNOSIS — Z992 Dependence on renal dialysis: Secondary | ICD-10-CM | POA: Diagnosis not present

## 2023-10-13 DIAGNOSIS — D509 Iron deficiency anemia, unspecified: Secondary | ICD-10-CM | POA: Diagnosis not present

## 2023-10-13 DIAGNOSIS — N2581 Secondary hyperparathyroidism of renal origin: Secondary | ICD-10-CM | POA: Diagnosis not present

## 2023-10-13 NOTE — Telephone Encounter (Signed)
 Inbound call from patient states  he still hasn't received his prep medication from [facility].   Please advise.

## 2023-10-13 NOTE — Telephone Encounter (Signed)
 Patient states he called [facility] and left them a message and has not got a return call. Informed patient to try to reach out to them again and let me know today or tomorrow if he cannot get in touch with them. Informed patient I know [facility] can overnight a prep if needed. If he does not hear back from them then we will have to switch his prep and send it to a local pharmacy. Also, give him new instructions for that prep. Patient states he will try again and call me back.

## 2023-10-14 MED ORDER — NA SULFATE-K SULFATE-MG SULF 17.5-3.13-1.6 GM/177ML PO SOLN: 1.0000 | Freq: Once | ORAL | 0 refills | Status: AC

## 2023-10-14 NOTE — Addendum Note (Signed)
 Addended by: Illene Bolus on: 10/14/2023 02:36 PM   Modules accepted: Orders

## 2023-10-14 NOTE — Telephone Encounter (Signed)
 Jeremy [name],  I received an email from [facility] stating that the patient has requested that a different prep that you spoke to him about (golytely) being sent to his local pharmacy.   Please send this prep and revised instructions to this patient. Thank you Bre

## 2023-10-14 NOTE — Telephone Encounter (Signed)
 Patient states Suflave was not covered by patient's insurance and was unaffordable. Informed patient we can switch his prep but he will need to come by our office to pick up new prep instructions, since it is too late to mail them and his does not have MyChart. Patient states he can come tomorrow. Reminded patient that he can use his old instructions to make sure he has been avoiding certain foods 5 days prior and also that he has to be clear liquids the entire day tomorrow until after his procedure. Patient verbalized understanding. Suprep sent to patient's pharmacy and new prep instructions printed and left up at the front desk for patient to pick up.

## 2023-10-15 DIAGNOSIS — R739 Hyperglycemia, unspecified: Secondary | ICD-10-CM | POA: Diagnosis not present

## 2023-10-15 DIAGNOSIS — D509 Iron deficiency anemia, unspecified: Secondary | ICD-10-CM | POA: Diagnosis not present

## 2023-10-15 DIAGNOSIS — Z111 Encounter for screening for respiratory tuberculosis: Secondary | ICD-10-CM | POA: Diagnosis not present

## 2023-10-15 DIAGNOSIS — Z992 Dependence on renal dialysis: Secondary | ICD-10-CM | POA: Diagnosis not present

## 2023-10-15 DIAGNOSIS — N2581 Secondary hyperparathyroidism of renal origin: Secondary | ICD-10-CM | POA: Diagnosis not present

## 2023-10-15 DIAGNOSIS — D631 Anemia in chronic kidney disease: Secondary | ICD-10-CM | POA: Diagnosis not present

## 2023-10-15 DIAGNOSIS — N186 End stage renal disease: Secondary | ICD-10-CM | POA: Diagnosis not present

## 2023-10-15 NOTE — Anesthesia Preprocedure Evaluation (Signed)
 Anesthesia Evaluation  Patient identified by MRN, date of birth, ID band Patient awake    Reviewed: Allergy & Precautions, NPO status , Patient's Chart, lab work & pertinent test results  History of Anesthesia Complications Negative for: history of anesthetic complications  Airway Mallampati: II  TM Distance: >3 FB Neck ROM: Full    Dental  (+) Dental Advisory Given, Poor Dentition, Chipped, Loose   Pulmonary sleep apnea    Pulmonary exam normal        Cardiovascular hypertension, Pt. on medications + Peripheral Vascular Disease and +CHF  Normal cardiovascular exam   '19 TTE - EF 45-50%    Neuro/Psych  Headaches, Seizures -, Well Controlled,   Bell's palsy   negative psych ROS   GI/Hepatic negative GI ROS, Neg liver ROS,,,  Endo/Other  diabetes, Type 2   Obesity   Renal/GU ESRF and DialysisRenal disease     Musculoskeletal negative musculoskeletal ROS (+)    Abdominal   Peds  Hematology  (+) Blood dyscrasia, anemia   Anesthesia Other Findings   Reproductive/Obstetrics                             Anesthesia Physical Anesthesia Plan  ASA: 3  Anesthesia Plan: MAC   Post-op Pain Management: Minimal or no pain anticipated   Induction:   PONV Risk Score and Plan: 1 and Propofol infusion and Treatment may vary due to age or medical condition  Airway Management Planned: Natural Airway and Simple Face Mask  Additional Equipment: None  Intra-op Plan:   Post-operative Plan:   Informed Consent: I have reviewed the patients History and Physical, chart, labs and discussed the procedure including the risks, benefits and alternatives for the proposed anesthesia with the patient or authorized representative who has indicated his/her understanding and acceptance.       Plan Discussed with: CRNA and Anesthesiologist  Anesthesia Plan Comments:        Anesthesia Quick  Evaluation

## 2023-10-16 ENCOUNTER — Ambulatory Visit (HOSPITAL_COMMUNITY): Payer: Self-pay | Admitting: Anesthesiology

## 2023-10-16 ENCOUNTER — Encounter (HOSPITAL_COMMUNITY)
Admission: RE | Disposition: A | Discharge status: Home / Self Care | Payer: Self-pay | Source: Home / Self Care | Attending: Internal Medicine

## 2023-10-16 ENCOUNTER — Ambulatory Visit (HOSPITAL_COMMUNITY)
Admission: RE | Admit: 2023-10-16 | Discharge: 2023-10-16 | Disposition: A | Discharge status: Home / Self Care | Payer: Medicare Other | Attending: Internal Medicine | Admitting: Internal Medicine

## 2023-10-16 ENCOUNTER — Ambulatory Visit (HOSPITAL_BASED_OUTPATIENT_CLINIC_OR_DEPARTMENT_OTHER): Payer: Self-pay | Admitting: Anesthesiology

## 2023-10-16 ENCOUNTER — Encounter (HOSPITAL_COMMUNITY): Payer: Self-pay | Admitting: Internal Medicine

## 2023-10-16 ENCOUNTER — Other Ambulatory Visit: Payer: Self-pay

## 2023-10-16 DIAGNOSIS — Z1211 Encounter for screening for malignant neoplasm of colon: Secondary | ICD-10-CM

## 2023-10-16 DIAGNOSIS — I509 Heart failure, unspecified: Secondary | ICD-10-CM

## 2023-10-16 DIAGNOSIS — N186 End stage renal disease: Secondary | ICD-10-CM | POA: Insufficient documentation

## 2023-10-16 DIAGNOSIS — E1122 Type 2 diabetes mellitus with diabetic chronic kidney disease: Secondary | ICD-10-CM | POA: Diagnosis not present

## 2023-10-16 DIAGNOSIS — I132 Hypertensive heart and chronic kidney disease with heart failure and with stage 5 chronic kidney disease, or end stage renal disease: Secondary | ICD-10-CM | POA: Diagnosis not present

## 2023-10-16 DIAGNOSIS — Z5986 Financial insecurity: Secondary | ICD-10-CM | POA: Diagnosis not present

## 2023-10-16 DIAGNOSIS — Z992 Dependence on renal dialysis: Secondary | ICD-10-CM | POA: Insufficient documentation

## 2023-10-16 DIAGNOSIS — Z833 Family history of diabetes mellitus: Secondary | ICD-10-CM | POA: Diagnosis not present

## 2023-10-16 DIAGNOSIS — I120 Hypertensive chronic kidney disease with stage 5 chronic kidney disease or end stage renal disease: Secondary | ICD-10-CM | POA: Diagnosis not present

## 2023-10-16 DIAGNOSIS — Z8249 Family history of ischemic heart disease and other diseases of the circulatory system: Secondary | ICD-10-CM | POA: Insufficient documentation

## 2023-10-16 HISTORY — PX: COLONOSCOPY WITH PROPOFOL: SHX5780

## 2023-10-16 LAB — POCT I-STAT, CHEM 8
BUN: 28 mg/dL — ABNORMAL HIGH (ref 6–20)
Calcium, Ion: 1.04 mmol/L — ABNORMAL LOW (ref 1.15–1.40)
Chloride: 101 mmol/L (ref 98–111)
Creatinine, Ser: 8.5 mg/dL — ABNORMAL HIGH (ref 0.61–1.24)
Glucose, Bld: 74 mg/dL (ref 70–99)
HCT: 38 % — ABNORMAL LOW (ref 39.0–52.0)
Hemoglobin: 12.9 g/dL — ABNORMAL LOW (ref 13.0–17.0)
Potassium: 4.6 mmol/L (ref 3.5–5.1)
Sodium: 136 mmol/L (ref 135–145)
TCO2: 30 mmol/L (ref 22–32)

## 2023-10-16 SURGERY — COLONOSCOPY WITH PROPOFOL
Anesthesia: Monitor Anesthesia Care

## 2023-10-16 MED ORDER — SODIUM CHLORIDE 0.9 % IV SOLN: INTRAVENOUS | Status: DC

## 2023-10-16 MED ORDER — PROPOFOL 10 MG/ML IV BOLUS
INTRAVENOUS | Status: DC | PRN
  Administered 2023-10-16 (×3): 40 mg via INTRAVENOUS
  Administered 2023-10-16: 50 mg via INTRAVENOUS
  Administered 2023-10-16 (×5): 40 mg via INTRAVENOUS

## 2023-10-16 SURGICAL SUPPLY — 20 items
ELECT REM PT RETURN 9FT ADLT (ELECTROSURGICAL) IMPLANT
ELECTRODE REM PT RTRN 9FT ADLT (ELECTROSURGICAL) IMPLANT
FLOOR PAD 36X40 (MISCELLANEOUS) ×1 IMPLANT
FORCEPS BIOP RAD 4 LRG CAP 4 (CUTTING FORCEPS) IMPLANT
FORCEPS BIOP RJ4 240 W/NDL (CUTTING FORCEPS) IMPLANT
FORCEPS BXJMBJMB 240X2.8X (CUTTING FORCEPS) IMPLANT
INJECTOR/SNARE I SNARE (MISCELLANEOUS) IMPLANT
LUBRICANT JELLY 4.5OZ STERILE (MISCELLANEOUS) IMPLANT
MANIFOLD NEPTUNE II (INSTRUMENTS) IMPLANT
NDL SCLEROTHERAPY 25GX240 (NEEDLE) IMPLANT
NEEDLE SCLEROTHERAPY 25GX240 (NEEDLE) IMPLANT
PAD FLOOR 36X40 (MISCELLANEOUS) ×1 IMPLANT
PROBE APC STR FIRE (PROBE) IMPLANT
PROBE INJECTION GOLD 7FR (MISCELLANEOUS) IMPLANT
SNARE ROTATE MED OVAL 20MM (MISCELLANEOUS) IMPLANT
SYR 50ML LL SCALE MARK (SYRINGE) IMPLANT
TRAP SPECIMEN MUCOUS 40CC (MISCELLANEOUS) IMPLANT
TUBING ENDO SMARTCAP PENTAX (MISCELLANEOUS) IMPLANT
TUBING IRRIGATION ENDOGATOR (MISCELLANEOUS) ×1 IMPLANT
WATER STERILE IRR 1000ML POUR (IV SOLUTION) IMPLANT

## 2023-10-16 NOTE — Op Note (Signed)
 [facility] Patient Name: Jeremy [name] Procedure Date : 10/16/2023 MRN: [id number] Attending MD: Iva Boop , MD, [id number] Date of Birth: Aug 22, 1965 CSN: [id number] Age: 58 Admit Type: Outpatient Procedure:                Colonoscopy Indications:              Screening for colorectal malignant neoplasm, This                            is the patient's first colonoscopy Providers:                Iva Boop, MD, Martha Clan, RN, Rhodia Albright, Technician Referring MD:              Medicines:                Monitored Anesthesia Care Complications:            No immediate complications. Estimated Blood Loss:     Estimated blood loss: none. Procedure:                Pre-Anesthesia Assessment:                           - Prior to the procedure, a History and Physical                            was performed, and patient medications and                            allergies were reviewed. The patient's tolerance of                            previous anesthesia was also reviewed. The risks                            and benefits of the procedure and the sedation                            options and risks were discussed with the patient.                            All questions were answered, and informed consent                            was obtained. Prior Anticoagulants: The patient has                            taken no anticoagulant or antiplatelet agents. ASA                            Grade Assessment: III - A patient with severe  systemic disease. After reviewing the risks and                            benefits, the patient was deemed in satisfactory                            condition to undergo the procedure.                           After obtaining informed consent, the colonoscope                            was passed under direct vision. Throughout the                            procedure,  the patient's blood pressure, pulse, and                            oxygen saturations were monitored continuously. The                            [id number]-[id number] ([id number]) Olympus colonoscope was                            introduced through the anus and advanced to the the                            cecum, identified by appendiceal orifice and                            ileocecal valve. The colonoscopy was performed                            without difficulty. The patient tolerated the                            procedure well. The quality of the bowel                            preparation was good. The ileocecal valve,                            appendiceal orifice, and rectum were photographed.                            The bowel preparation used was SUPREP via split                            dose instruction. Scope In: 8:22:15 AM Scope Out: 8:41:26 AM Scope Withdrawal Time: 0 hours 15 minutes 4 seconds  Total Procedure Duration: 0 hours 19 minutes 11 seconds  Findings:      The perianal and digital rectal examinations were normal.      The entire examined colon appeared normal on direct and retroflexion  views. Impression:               - The entire examined colon is normal on direct and                            retroflexion views.                           - No specimens collected. Recommendation:           - Patient has a contact number available for                            emergencies. The signs and symptoms of potential                            delayed complications were discussed with the                            patient. Return to normal activities tomorrow.                            Written discharge instructions were provided to the                            patient.                           - Resume previous diet.                           - Continue present medications.                           - Repeat colonoscopy in 10 years for screening                             purposes. procedure at hospital due to ESRD on                            dialysis Procedure Code(s):        --- Professional ---                           J4782, Colorectal cancer screening; colonoscopy on                            individual not meeting criteria for high risk Diagnosis Code(s):        --- Professional ---                           Z12.11, Encounter for screening for malignant                            neoplasm of colon CPT copyright 2022 [employer]. All rights reserved. The codes documented in this report are preliminary and upon coder review may  be  revised to meet current compliance requirements. Iva Boop, MD 10/16/2023 8:58:28 AM This report has been signed electronically. Number of Addenda: 0

## 2023-10-16 NOTE — H&P (Signed)
 [facility] History and Physical   Primary Care Physician:  Claiborne Rigg, NP   Reason for Procedure:   Colon cancer screening  Plan:    colonoscopy     HPI: Jeremy [name]. is a 58 y.o. male w/ ESRD here for  a screening colonoscopy   Past Medical History:  Diagnosis Date   Abscess    AKI (acute kidney injury) (HCC) 08/05/2017   Bell's palsy    right eye abn since dx bell's palsy   CKD (chronic kidney disease)    Dr. McDiarmind    COVID 2020   mild   Dyspnea    occasional with exertion   ESRD (end stage renal disease) (HCC)    MWF -[facility]   Headache    migraines   High cholesterol    Hypertension    Left shoulder pain 10/26/2013   S/p injection on 10/26/13    Renal insufficiency 02/14/2014   Seizures (HCC) 08/04/2017   "related to high blood sugars" (08/05/2017)   Type II diabetes mellitus (HCC)     Past Surgical History:  Procedure Laterality Date   A/V FISTULAGRAM Left 07/25/2022   Procedure: A/V Fistulagram;  Surgeon: Cephus Shelling, MD;  Location: [facility];  Service: Cardiovascular;  Laterality: Left;   AV FISTULA PLACEMENT Left 11/11/2019   Procedure: LEFT FOREARM RADIOCEPHALIC ARTERIOVENOUS (AV) FISTULA CREATION;  Surgeon: Cephus Shelling, MD;  Location: [facility] OR;  Service: Vascular;  Laterality: Left;   AV FISTULA PLACEMENT Left 08/14/2022   Procedure: FIRST STAGE BRACHIOBASILIC LEFT ARM ARTERIOVENOUS (AV) FISTULA CREATION;  Surgeon: Cephus Shelling, MD;  Location: [facility] OR;  Service: Vascular;  Laterality: Left;   BASCILIC VEIN TRANSPOSITION Left 10/30/2022   Procedure: LEFT ARM SECOND STAGE BASILIC VEIN TRANSPOSITION;  Surgeon: Cephus Shelling, MD;  Location: [facility];  Service: Vascular;  Laterality: Left;   Eyelid surgery Right    I & D EXTREMITY  11/11/2011   Procedure: IRRIGATION AND DEBRIDEMENT EXTREMITY;  Surgeon: Robyne Askew, MD;  Location: [facility];  Service: General;  Laterality: Right;  I&D Right Thigh  Abscess   INSERTION OF DIALYSIS CATHETER N/A 07/25/2022   Procedure: INSERTION OF DIALYSIS CATHETER;  Surgeon: Cephus Shelling, MD;  Location: [facility];  Service: Vascular;  Laterality: N/A;   IR FLUORO GUIDE CV LINE RIGHT  08/12/2019   IR US GUIDE VASC ACCESS RIGHT  08/12/2019   LIGATION OF ARTERIOVENOUS  FISTULA Left 08/14/2022   Procedure: LIGATION OF BRACHIOCEPHALIC ARTERIOVENOUS  FISTULA;  Surgeon: Cephus Shelling, MD;  Location: [facility] OR;  Service: Vascular;  Laterality: Left;   REVISON OF ARTERIOVENOUS FISTULA Left 03/23/2020   Procedure: LEFT ARM ARTERIOVENOUS FISTULA REVISON, LIGATION OF SIDE BRANCHES AND ELEVATION;  Surgeon: Cephus Shelling, MD;  Location: [facility] OR;  Service: Vascular;  Laterality: Left;    Prior to Admission medications   Medication Sig Start Date End Date Taking? Authorizing Provider  amLODipine (NORVASC) 2.5 MG tablet Take 2.5 mg by mouth at bedtime.   Yes [provider]  aspirin EC 81 MG tablet Take 1 tablet (81 mg total) by mouth daily. 08/26/23  Yes Claiborne Rigg, NP  atorvastatin (LIPITOR) 40 MG tablet Take 1 tablet (40 mg total) by mouth daily. 08/26/23  Yes Claiborne Rigg, NP  baclofen (LIORESAL) 10 MG tablet Take 1 tablet (10 mg total) by mouth at bedtime. 08/07/23  Yes   enalapril (VASOTEC) 5 MG tablet Take 1 tablet (5  mg total) by mouth daily. 10/24/22  Yes Hoy Register, MD  gabapentin (NEURONTIN) 100 MG capsule Take 1 capsule (100 mg total) by mouth 3 (three) times a week. Take on HD days after HD (hemodialysis) and in the evenings. 03/12/23  Yes Claiborne Rigg, NP  levETIRAcetam (KEPPRA XR) 500 MG 24 hr tablet Take 1,000 mg by mouth daily.   Yes [provider]  SENSIPAR 30 MG tablet Take 60 mg by mouth 3 (three) times a week. 11/15/22  Yes [provider]  VELPHORO 500 MG chewable tablet Chew 500 mg by mouth 3 (three) times daily with meals. 02/07/20  Yes [provider]  Vitamin D, Ergocalciferol, (DRISDOL) 1.25 MG  (50000 UNIT) CAPS capsule Take 1 capsule (50,000 Units total) by mouth once a week. 08/21/23  Yes Claiborne Rigg, NP  benzonatate (TESSALON) 100 MG capsule Take 1 capsule (100 mg total) by mouth 3 (three) times daily as needed for cough. 07/29/23   Zenia Resides, MD  Blood Glucose Monitoring Suppl (CONTOUR NEXT EZ) w/Device KIT Use to check blood sugar three times daily. 08/06/22   Hoy Register, MD  Blood Pressure Monitoring (BLOOD PRESSURE DIGITAL SOLN) KIT Use to check blood pressure daily. 05/15/23   Claiborne Rigg, NP  diclofenac Sodium (VOLTAREN) 1 % GEL Apply 4 g topically 4 (four) times daily. 08/20/22   Claiborne Rigg, NP  glucose blood (CONTOUR NEXT TEST) test strip Use to check blood sugar three times daily. 08/21/23   Hoy Register, MD  Lancet Devices (MICROLET NEXT LANCING DEVICE) MISC 1 Device by Does not apply route 3 (three) times daily. 11/30/19   Claiborne Rigg, NP  Methoxy PEG-Epoetin Beta (MIRCERA IJ) Mircera 04/09/23 04/07/24  [provider]  Microlet Lancets MISC Use to check blood sugar three times daily. E11.65 08/06/22   Hoy Register, MD  pantoprazole (PROTONIX) 40 MG tablet Take 1 tablet (40 mg total) by mouth daily before breakfast. 08/18/23   Claiborne Rigg, NP    Current Facility-Administered Medications  Medication Dose Route Frequency Provider Last Rate Last Admin   0.9 %  sodium chloride infusion   Intravenous Continuous Boone Master M, PA-C 20 mL/hr at 10/16/23 0733 New Bag at 10/16/23 0733    Allergies as of 09/04/2023 - Review Complete 09/04/2023  Allergen Reaction Noted   Amlodipine Other (See Comments) 12/20/2019    Family History  Problem Relation Age of Onset   Diabetes Mother    Heart disease Mother 36   Diabetes Sister    Diabetes Brother    Diabetes Brother    Cancer Maternal Uncle        type unknown   Seizures Niece    Stroke Neg Hx     Social History   Socioeconomic History   Marital status: Significant Other     Spouse name: Not on file   Number of children: 3   Years of education: Not on file   Highest education level: Not on file  Occupational History   Occupation: maintenance tech   Occupation: Disablity  Tobacco Use   Smoking status: Never    Passive exposure: Never   Smokeless tobacco: Never  Vaping Use   Vaping status: Never Used  Substance and Sexual Activity   Alcohol use: Not Currently   Drug use: No   Sexual activity: Yes  Other Topics Concern   Not on file  Social History Narrative   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  Did not graduate high school.  Has 3 adult children in [place].  Divorced.  Lives with girlfriend Jeremy [name]).                   Social Drivers of Health   Financial Resource Strain: Medium Risk (08/07/2023)   Received from [facility]   Overall Financial Resource Strain (CARDIA)    Difficulty of Paying Living Expenses: Somewhat hard  Food Insecurity: No Food Insecurity (08/07/2023)   Hunger Vital Sign    Worried About Running Out of Food in the Last Year: Never true    Ran Out of Food in the Last Year: Never true  Recent Concern: Food Insecurity - Food Insecurity Present (07/08/2023)   Hunger Vital Sign    Worried About Running Out of Food in the Last Year: Sometimes true    Ran Out of Food in the Last Year: Sometimes true  Transportation Needs: No Transportation Needs (08/07/2023)   PRAPARE - Administrator, Civil Service (Medical): No    Lack of Transportation (Non-Medical): No  Physical Activity: Inactive (01/01/2023)   Exercise Vital Sign    Days of Exercise per Week: 0 days    Minutes of Exercise per Session: 0 min  Stress: Stress Concern Present (01/01/2023)   [employer] of Occupational Health - Occupational Stress Questionnaire    Feeling of Stress : To some extent  Social Connections: Moderately Integrated (01/01/2023)   Social Connection and Isolation Panel [NHANES]     Frequency of Communication with Friends and Family: Three times a week    Frequency of Social Gatherings with Friends and Family: Three times a week    Attends Religious Services: Never    Active Member of Clubs or Organizations: No    Attends Engineer, structural: More than 4 times per year    Marital Status: Living with partner  Recent Concern: Social Connections - Socially Isolated (10/24/2022)   Social Connection and Isolation Panel [NHANES]    Frequency of Communication with Friends and Family: Three times a week    Frequency of Social Gatherings with Friends and Family: Three times a week    Attends Religious Services: Never    Active Member of Clubs or Organizations: No    Attends Banker Meetings: Never    Marital Status: Divorced  Catering manager Violence: Not At Risk (08/07/2023)   Humiliation, Afraid, Rape, and Kick questionnaire    Fear of Current or Ex-Partner: No    Emotionally Abused: No    Physically Abused: No    Sexually Abused: No    Review of Systems:  All other review of systems negative except as mentioned in the HPI.  Physical Exam: Vital signs BP (!) 168/87   Pulse 82   Temp 97.6 F (36.4 C) (Temporal)   Resp 12   Ht 5\' 8"  (1.727 m)   Wt 113.9 kg   SpO2 99%   BMI 38.16 kg/m   General:   Alert,  Well-developed, well-nourished, pleasant and cooperative in NAD Lungs:  Clear throughout to auscultation.   Heart:  Regular rate and rhythm; no murmurs, clicks, rubs,  or gallops. Abdomen:  Soft, nontender and nondistended. Normal bowel sounds.   Neuro/Psych:  Alert and cooperative. Normal mood and affect. A and O x 3   @Jeremy [name]  Jeremy Slate, MD, [facility] [phone] (pager) 10/16/2023 8:05 AM@

## 2023-10-16 NOTE — Anesthesia Postprocedure Evaluation (Signed)
 Anesthesia Post Note  Patient: Jeremy [name].  Procedure(s) Performed: COLONOSCOPY WITH PROPOFOL     Patient location during evaluation: PACU Anesthesia Type: MAC Level of consciousness: awake and alert Pain management: pain level controlled Vital Signs Assessment: post-procedure vital signs reviewed and stable Respiratory status: spontaneous breathing, nonlabored ventilation and respiratory function stable Cardiovascular status: stable and blood pressure returned to baseline Anesthetic complications: no  No notable events documented.  Last Vitals:  Vitals:   10/16/23 0850 10/16/23 0900  BP: (!) 136/55 122/70  Pulse: 77 74  Resp: (!) 21 18  Temp:    SpO2: 98% 98%    Last Pain:  Vitals:   10/16/23 0900  TempSrc:   PainSc: 0-No pain                 Beryle Lathe

## 2023-10-16 NOTE — Discharge Instructions (Signed)

## 2023-10-16 NOTE — Transfer of Care (Signed)
 Immediate Anesthesia Transfer of Care Note  Patient: Jeremy [name].  Procedure(s) Performed: COLONOSCOPY WITH PROPOFOL  Patient Location: Endoscopy Unit  Anesthesia Type:MAC  Level of Consciousness: awake, alert , and oriented  Airway & Oxygen Therapy: Patient Spontanous Breathing  Post-op Assessment: Report given to RN and Post -op Vital signs reviewed and stable  Post vital signs: Reviewed and stable  Last Vitals:  Vitals Value Taken Time  BP    Temp    Pulse 80 10/16/23 0846  Resp 18 10/16/23 0846  SpO2 99 % 10/16/23 0846  Vitals shown include unfiled device data.  Last Pain:  Vitals:   10/16/23 0716  TempSrc: Temporal  PainSc: 0-No pain         Complications: No notable events documented.

## 2023-10-17 DIAGNOSIS — R739 Hyperglycemia, unspecified: Secondary | ICD-10-CM | POA: Diagnosis not present

## 2023-10-17 DIAGNOSIS — D509 Iron deficiency anemia, unspecified: Secondary | ICD-10-CM | POA: Diagnosis not present

## 2023-10-17 DIAGNOSIS — Z111 Encounter for screening for respiratory tuberculosis: Secondary | ICD-10-CM | POA: Diagnosis not present

## 2023-10-17 DIAGNOSIS — D631 Anemia in chronic kidney disease: Secondary | ICD-10-CM | POA: Diagnosis not present

## 2023-10-17 DIAGNOSIS — Z992 Dependence on renal dialysis: Secondary | ICD-10-CM | POA: Diagnosis not present

## 2023-10-17 DIAGNOSIS — N186 End stage renal disease: Secondary | ICD-10-CM | POA: Diagnosis not present

## 2023-10-17 DIAGNOSIS — N2581 Secondary hyperparathyroidism of renal origin: Secondary | ICD-10-CM | POA: Diagnosis not present

## 2023-10-20 DIAGNOSIS — Z992 Dependence on renal dialysis: Secondary | ICD-10-CM | POA: Diagnosis not present

## 2023-10-20 DIAGNOSIS — N186 End stage renal disease: Secondary | ICD-10-CM | POA: Diagnosis not present

## 2023-10-20 DIAGNOSIS — I129 Hypertensive chronic kidney disease with stage 1 through stage 4 chronic kidney disease, or unspecified chronic kidney disease: Secondary | ICD-10-CM | POA: Diagnosis not present

## 2023-10-22 DIAGNOSIS — E1129 Type 2 diabetes mellitus with other diabetic kidney complication: Secondary | ICD-10-CM | POA: Diagnosis not present

## 2023-10-22 DIAGNOSIS — Z992 Dependence on renal dialysis: Secondary | ICD-10-CM | POA: Diagnosis not present

## 2023-10-22 DIAGNOSIS — D509 Iron deficiency anemia, unspecified: Secondary | ICD-10-CM | POA: Diagnosis not present

## 2023-10-22 DIAGNOSIS — D631 Anemia in chronic kidney disease: Secondary | ICD-10-CM | POA: Diagnosis not present

## 2023-10-22 DIAGNOSIS — N186 End stage renal disease: Secondary | ICD-10-CM | POA: Diagnosis not present

## 2023-10-22 DIAGNOSIS — N2581 Secondary hyperparathyroidism of renal origin: Secondary | ICD-10-CM | POA: Diagnosis not present

## 2023-10-23 DIAGNOSIS — M5416 Radiculopathy, lumbar region: Secondary | ICD-10-CM | POA: Diagnosis not present

## 2023-10-27 DIAGNOSIS — D631 Anemia in chronic kidney disease: Secondary | ICD-10-CM | POA: Diagnosis not present

## 2023-10-27 DIAGNOSIS — D509 Iron deficiency anemia, unspecified: Secondary | ICD-10-CM | POA: Diagnosis not present

## 2023-10-27 DIAGNOSIS — E1129 Type 2 diabetes mellitus with other diabetic kidney complication: Secondary | ICD-10-CM | POA: Diagnosis not present

## 2023-10-27 DIAGNOSIS — N2581 Secondary hyperparathyroidism of renal origin: Secondary | ICD-10-CM | POA: Diagnosis not present

## 2023-10-27 DIAGNOSIS — N186 End stage renal disease: Secondary | ICD-10-CM | POA: Diagnosis not present

## 2023-10-27 DIAGNOSIS — Z992 Dependence on renal dialysis: Secondary | ICD-10-CM | POA: Diagnosis not present

## 2023-10-29 DIAGNOSIS — Z992 Dependence on renal dialysis: Secondary | ICD-10-CM | POA: Diagnosis not present

## 2023-10-29 DIAGNOSIS — N2581 Secondary hyperparathyroidism of renal origin: Secondary | ICD-10-CM | POA: Diagnosis not present

## 2023-10-29 DIAGNOSIS — N186 End stage renal disease: Secondary | ICD-10-CM | POA: Diagnosis not present

## 2023-10-29 DIAGNOSIS — E1129 Type 2 diabetes mellitus with other diabetic kidney complication: Secondary | ICD-10-CM | POA: Diagnosis not present

## 2023-10-29 DIAGNOSIS — D631 Anemia in chronic kidney disease: Secondary | ICD-10-CM | POA: Diagnosis not present

## 2023-10-29 DIAGNOSIS — D509 Iron deficiency anemia, unspecified: Secondary | ICD-10-CM | POA: Diagnosis not present

## 2023-10-31 DIAGNOSIS — Z992 Dependence on renal dialysis: Secondary | ICD-10-CM | POA: Diagnosis not present

## 2023-10-31 DIAGNOSIS — D631 Anemia in chronic kidney disease: Secondary | ICD-10-CM | POA: Diagnosis not present

## 2023-10-31 DIAGNOSIS — E1129 Type 2 diabetes mellitus with other diabetic kidney complication: Secondary | ICD-10-CM | POA: Diagnosis not present

## 2023-10-31 DIAGNOSIS — N2581 Secondary hyperparathyroidism of renal origin: Secondary | ICD-10-CM | POA: Diagnosis not present

## 2023-10-31 DIAGNOSIS — N186 End stage renal disease: Secondary | ICD-10-CM | POA: Diagnosis not present

## 2023-10-31 DIAGNOSIS — D509 Iron deficiency anemia, unspecified: Secondary | ICD-10-CM | POA: Diagnosis not present

## 2023-11-03 DIAGNOSIS — D509 Iron deficiency anemia, unspecified: Secondary | ICD-10-CM | POA: Diagnosis not present

## 2023-11-03 DIAGNOSIS — E1129 Type 2 diabetes mellitus with other diabetic kidney complication: Secondary | ICD-10-CM | POA: Diagnosis not present

## 2023-11-03 DIAGNOSIS — D631 Anemia in chronic kidney disease: Secondary | ICD-10-CM | POA: Diagnosis not present

## 2023-11-03 DIAGNOSIS — N186 End stage renal disease: Secondary | ICD-10-CM | POA: Diagnosis not present

## 2023-11-03 DIAGNOSIS — Z992 Dependence on renal dialysis: Secondary | ICD-10-CM | POA: Diagnosis not present

## 2023-11-03 DIAGNOSIS — N2581 Secondary hyperparathyroidism of renal origin: Secondary | ICD-10-CM | POA: Diagnosis not present

## 2023-11-05 DIAGNOSIS — Z992 Dependence on renal dialysis: Secondary | ICD-10-CM | POA: Diagnosis not present

## 2023-11-05 DIAGNOSIS — N2581 Secondary hyperparathyroidism of renal origin: Secondary | ICD-10-CM | POA: Diagnosis not present

## 2023-11-05 DIAGNOSIS — N186 End stage renal disease: Secondary | ICD-10-CM | POA: Diagnosis not present

## 2023-11-05 DIAGNOSIS — D509 Iron deficiency anemia, unspecified: Secondary | ICD-10-CM | POA: Diagnosis not present

## 2023-11-05 DIAGNOSIS — D631 Anemia in chronic kidney disease: Secondary | ICD-10-CM | POA: Diagnosis not present

## 2023-11-05 DIAGNOSIS — E1129 Type 2 diabetes mellitus with other diabetic kidney complication: Secondary | ICD-10-CM | POA: Diagnosis not present

## 2023-11-07 DIAGNOSIS — N186 End stage renal disease: Secondary | ICD-10-CM | POA: Diagnosis not present

## 2023-11-07 DIAGNOSIS — Z992 Dependence on renal dialysis: Secondary | ICD-10-CM | POA: Diagnosis not present

## 2023-11-07 DIAGNOSIS — D689 Coagulation defect, unspecified: Secondary | ICD-10-CM | POA: Diagnosis not present

## 2023-11-07 DIAGNOSIS — D631 Anemia in chronic kidney disease: Secondary | ICD-10-CM | POA: Diagnosis not present

## 2023-11-10 DIAGNOSIS — N186 End stage renal disease: Secondary | ICD-10-CM | POA: Diagnosis not present

## 2023-11-10 DIAGNOSIS — Z992 Dependence on renal dialysis: Secondary | ICD-10-CM | POA: Diagnosis not present

## 2023-11-10 DIAGNOSIS — D689 Coagulation defect, unspecified: Secondary | ICD-10-CM | POA: Diagnosis not present

## 2023-11-14 DIAGNOSIS — D631 Anemia in chronic kidney disease: Secondary | ICD-10-CM | POA: Diagnosis not present

## 2023-11-14 DIAGNOSIS — N186 End stage renal disease: Secondary | ICD-10-CM | POA: Diagnosis not present

## 2023-11-14 DIAGNOSIS — E1129 Type 2 diabetes mellitus with other diabetic kidney complication: Secondary | ICD-10-CM | POA: Diagnosis not present

## 2023-11-14 DIAGNOSIS — N2581 Secondary hyperparathyroidism of renal origin: Secondary | ICD-10-CM | POA: Diagnosis not present

## 2023-11-14 DIAGNOSIS — Z992 Dependence on renal dialysis: Secondary | ICD-10-CM | POA: Diagnosis not present

## 2023-11-14 DIAGNOSIS — D509 Iron deficiency anemia, unspecified: Secondary | ICD-10-CM | POA: Diagnosis not present

## 2023-11-17 DIAGNOSIS — Z992 Dependence on renal dialysis: Secondary | ICD-10-CM | POA: Diagnosis not present

## 2023-11-17 DIAGNOSIS — D509 Iron deficiency anemia, unspecified: Secondary | ICD-10-CM | POA: Diagnosis not present

## 2023-11-17 DIAGNOSIS — N186 End stage renal disease: Secondary | ICD-10-CM | POA: Diagnosis not present

## 2023-11-17 DIAGNOSIS — D631 Anemia in chronic kidney disease: Secondary | ICD-10-CM | POA: Diagnosis not present

## 2023-11-17 DIAGNOSIS — E1129 Type 2 diabetes mellitus with other diabetic kidney complication: Secondary | ICD-10-CM | POA: Diagnosis not present

## 2023-11-17 DIAGNOSIS — N2581 Secondary hyperparathyroidism of renal origin: Secondary | ICD-10-CM | POA: Diagnosis not present

## 2023-11-18 ENCOUNTER — Ambulatory Visit: Payer: Medicare Other | Admitting: Nurse Practitioner

## 2023-11-18 DIAGNOSIS — M5416 Radiculopathy, lumbar region: Secondary | ICD-10-CM | POA: Diagnosis not present

## 2023-11-19 DIAGNOSIS — E1129 Type 2 diabetes mellitus with other diabetic kidney complication: Secondary | ICD-10-CM | POA: Diagnosis not present

## 2023-11-19 DIAGNOSIS — I129 Hypertensive chronic kidney disease with stage 1 through stage 4 chronic kidney disease, or unspecified chronic kidney disease: Secondary | ICD-10-CM | POA: Diagnosis not present

## 2023-11-19 DIAGNOSIS — N186 End stage renal disease: Secondary | ICD-10-CM | POA: Diagnosis not present

## 2023-11-19 DIAGNOSIS — Z992 Dependence on renal dialysis: Secondary | ICD-10-CM | POA: Diagnosis not present

## 2023-11-19 DIAGNOSIS — N2581 Secondary hyperparathyroidism of renal origin: Secondary | ICD-10-CM | POA: Diagnosis not present

## 2023-11-19 DIAGNOSIS — D509 Iron deficiency anemia, unspecified: Secondary | ICD-10-CM | POA: Diagnosis not present

## 2023-11-19 DIAGNOSIS — D631 Anemia in chronic kidney disease: Secondary | ICD-10-CM | POA: Diagnosis not present

## 2023-11-20 ENCOUNTER — Ambulatory Visit (HOSPITAL_COMMUNITY)

## 2023-11-20 ENCOUNTER — Ambulatory Visit

## 2023-11-21 DIAGNOSIS — N186 End stage renal disease: Secondary | ICD-10-CM | POA: Diagnosis not present

## 2023-11-21 DIAGNOSIS — R739 Hyperglycemia, unspecified: Secondary | ICD-10-CM | POA: Diagnosis not present

## 2023-11-21 DIAGNOSIS — D509 Iron deficiency anemia, unspecified: Secondary | ICD-10-CM | POA: Diagnosis not present

## 2023-11-21 DIAGNOSIS — E1129 Type 2 diabetes mellitus with other diabetic kidney complication: Secondary | ICD-10-CM | POA: Diagnosis not present

## 2023-11-21 DIAGNOSIS — N2581 Secondary hyperparathyroidism of renal origin: Secondary | ICD-10-CM | POA: Diagnosis not present

## 2023-11-21 DIAGNOSIS — Z992 Dependence on renal dialysis: Secondary | ICD-10-CM | POA: Diagnosis not present

## 2023-11-25 ENCOUNTER — Ambulatory Visit: Payer: Self-pay | Attending: Nurse Practitioner | Admitting: Pharmacist

## 2023-11-25 ENCOUNTER — Encounter: Payer: Self-pay | Admitting: Pharmacist

## 2023-11-25 VITALS — BP 136/73 | HR 68

## 2023-11-25 DIAGNOSIS — Z992 Dependence on renal dialysis: Secondary | ICD-10-CM

## 2023-11-25 DIAGNOSIS — R739 Hyperglycemia, unspecified: Secondary | ICD-10-CM | POA: Diagnosis not present

## 2023-11-25 DIAGNOSIS — N186 End stage renal disease: Secondary | ICD-10-CM

## 2023-11-25 DIAGNOSIS — E1151 Type 2 diabetes mellitus with diabetic peripheral angiopathy without gangrene: Secondary | ICD-10-CM | POA: Diagnosis not present

## 2023-11-25 DIAGNOSIS — D509 Iron deficiency anemia, unspecified: Secondary | ICD-10-CM | POA: Diagnosis not present

## 2023-11-25 DIAGNOSIS — I1A0 Resistant hypertension: Secondary | ICD-10-CM

## 2023-11-25 DIAGNOSIS — N2581 Secondary hyperparathyroidism of renal origin: Secondary | ICD-10-CM | POA: Diagnosis not present

## 2023-11-25 DIAGNOSIS — E1129 Type 2 diabetes mellitus with other diabetic kidney complication: Secondary | ICD-10-CM | POA: Diagnosis not present

## 2023-11-25 NOTE — Progress Notes (Signed)
 S:     No chief complaint on file.  58 y.o. male who presents for diabetes evaluation, education, and management.  PMH is significant for T2DM w/ ESRD (MWF HD), anemia of chronic disease, resistant HTN, OSA, HLD, obesity, epilepsy. Patient was referred and last seen by Zelda on 08/26/2023. I saw him last on 09/23/2023.  I've been involved in his care since 09/2021. In March of this year, we checked labs. Fructoasmine showed A1c is actually around ~6.6% which is slightly higher than his POCT glycosylated hemoglobin of 5.7%. I did not make any DM medication changes. He is currently managing DM without medication.   Regarding his HTN, his BP fluctuates d/t HD. For this reason, it is difficult to adjust his antihypertensives.Today, patient arrives in good spirits and presents without any assistance. He has no complaints.   Family/Social History:  -Fhx: DM, heart disease, seizure disorder, stroke -Tobacco: never smoker  -Alcohol: none reported   Current diabetes medications include: none Current hypertension medications include: amlodipine  5mg  daily, enalapril  5 mg (only taking on non-dialysis days) Current HLD medications include: atorvastatin  40 mg daily  Insurance coverage: BCBS Medicare, Family Planning Medicaid  Patient denies nocturia (nighttime urination).  Patient reports neuropathy (nerve pain). Patient denies visual changes. Patient reports self foot exams.   Patient reported no changes from previous dietary habits:  -Adherent to sodium restriction. -Has stopped drinking regular Pepsi and Mt. Dew daily.  -Limits carbohydrates and tries to avoid sweets.  Patient-reported exercise habits: walking and yard work  O:   Lab Results  Component Value Date   HGBA1C 5.7 09/23/2023   Vitals:   11/25/23 0917  BP: 136/73  Pulse: 68   Lipid Panel     Component Value Date/Time   CHOL 159 09/23/2023 0918   TRIG 77 09/23/2023 0918   HDL 46 09/23/2023 0918   CHOLHDL 3.5  09/23/2023 0918   CHOLHDL 3.1 02/09/2016 1045   VLDL 11 02/09/2016 1045   LDLCALC 98 09/23/2023 0918   Clinical Atherosclerotic Cardiovascular Disease (ASCVD): No  The 10-year ASCVD risk score (Arnett DK, et al., 2019) is: 22.9%   Values used to calculate the score:     Age: 39 years     Sex: Male     Is Non-Hispanic African American: Yes     Diabetic: Yes     Tobacco smoker: No     Systolic Blood Pressure: 136 mmHg     Is BP treated: Yes     HDL Cholesterol: 46 mg/dL     Total Cholesterol: 159 mg/dL   A/P: Diabetes longstanding currently at goal. Home sugars are at goal. Patient is able to verbalize appropriate hypoglycemia management plan but is not having hypoglycemia at this time. He has not taken insulin  in at least the last several months. With his ESRD/HD hx, we will get a fructosamine as A1c can be falsely low in this setting. -Continue management with lifestyle alone. We will keep a close eye on this given his candidacy for renal transplant. I recommend to check another fructosamine level with Dr. Brent Cambric on 12/04/2023.  -Extensively discussed pathophysiology of diabetes, recommended lifestyle interventions, dietary effects on blood sugar control.  -Counseled on s/sx of and management of hypoglycemia.  -Next A1c anticipated 12/2023.  Hypertension longstanding currently close to goal. Blood pressure goal of <130/80 mmHg. BP management is difficult with his HD. Patient took amlodipine  and enalapril  today.  -Continue enalapril  5 mg daily on non-HD -Continue amlodipine  5 mg daily  on non-HD days   Written patient instructions provided. Patient verbalized understanding of treatment plan.  Total time in face to face counseling 20 minutes.    Follow-up:  Pharmacist in 2 months. Dr. Brent Cambric 12/04/2023.  Marene Shape, PharmD, Becky Bowels, CPP Clinical Pharmacist [facility] [phone]

## 2023-11-26 DIAGNOSIS — N186 End stage renal disease: Secondary | ICD-10-CM | POA: Diagnosis not present

## 2023-11-26 DIAGNOSIS — D509 Iron deficiency anemia, unspecified: Secondary | ICD-10-CM | POA: Diagnosis not present

## 2023-11-26 DIAGNOSIS — Z992 Dependence on renal dialysis: Secondary | ICD-10-CM | POA: Diagnosis not present

## 2023-11-26 DIAGNOSIS — R739 Hyperglycemia, unspecified: Secondary | ICD-10-CM | POA: Diagnosis not present

## 2023-11-26 DIAGNOSIS — N2581 Secondary hyperparathyroidism of renal origin: Secondary | ICD-10-CM | POA: Diagnosis not present

## 2023-11-26 DIAGNOSIS — E1129 Type 2 diabetes mellitus with other diabetic kidney complication: Secondary | ICD-10-CM | POA: Diagnosis not present

## 2023-11-28 DIAGNOSIS — N186 End stage renal disease: Secondary | ICD-10-CM | POA: Diagnosis not present

## 2023-11-28 DIAGNOSIS — R739 Hyperglycemia, unspecified: Secondary | ICD-10-CM | POA: Diagnosis not present

## 2023-11-28 DIAGNOSIS — E1129 Type 2 diabetes mellitus with other diabetic kidney complication: Secondary | ICD-10-CM | POA: Diagnosis not present

## 2023-11-28 DIAGNOSIS — Z992 Dependence on renal dialysis: Secondary | ICD-10-CM | POA: Diagnosis not present

## 2023-11-28 DIAGNOSIS — N2581 Secondary hyperparathyroidism of renal origin: Secondary | ICD-10-CM | POA: Diagnosis not present

## 2023-11-28 DIAGNOSIS — D509 Iron deficiency anemia, unspecified: Secondary | ICD-10-CM | POA: Diagnosis not present

## 2023-12-01 DIAGNOSIS — E1129 Type 2 diabetes mellitus with other diabetic kidney complication: Secondary | ICD-10-CM | POA: Diagnosis not present

## 2023-12-01 DIAGNOSIS — Z992 Dependence on renal dialysis: Secondary | ICD-10-CM | POA: Diagnosis not present

## 2023-12-01 DIAGNOSIS — D509 Iron deficiency anemia, unspecified: Secondary | ICD-10-CM | POA: Diagnosis not present

## 2023-12-01 DIAGNOSIS — N186 End stage renal disease: Secondary | ICD-10-CM | POA: Diagnosis not present

## 2023-12-01 DIAGNOSIS — N2581 Secondary hyperparathyroidism of renal origin: Secondary | ICD-10-CM | POA: Diagnosis not present

## 2023-12-01 DIAGNOSIS — R739 Hyperglycemia, unspecified: Secondary | ICD-10-CM | POA: Diagnosis not present

## 2023-12-03 DIAGNOSIS — N2581 Secondary hyperparathyroidism of renal origin: Secondary | ICD-10-CM | POA: Diagnosis not present

## 2023-12-03 DIAGNOSIS — E1129 Type 2 diabetes mellitus with other diabetic kidney complication: Secondary | ICD-10-CM | POA: Diagnosis not present

## 2023-12-03 DIAGNOSIS — N186 End stage renal disease: Secondary | ICD-10-CM | POA: Diagnosis not present

## 2023-12-03 DIAGNOSIS — D509 Iron deficiency anemia, unspecified: Secondary | ICD-10-CM | POA: Diagnosis not present

## 2023-12-03 DIAGNOSIS — R739 Hyperglycemia, unspecified: Secondary | ICD-10-CM | POA: Diagnosis not present

## 2023-12-03 DIAGNOSIS — Z992 Dependence on renal dialysis: Secondary | ICD-10-CM | POA: Diagnosis not present

## 2023-12-04 ENCOUNTER — Ambulatory Visit: Admitting: Critical Care Medicine

## 2023-12-04 IMAGING — CT CT HEAD W/O CM
4 series · 16 of 47 positions shown, 18 images · non-contrast
Comparison: MRI of the brain 08/10/2017

CLINICAL DATA: Headache.



[Series 3: head without · axial · non-contrast · 0.48mm/px · z∈[-202,-77]mm · 7 of 35 slices shown, 9 images]
[im 5/35  brain]
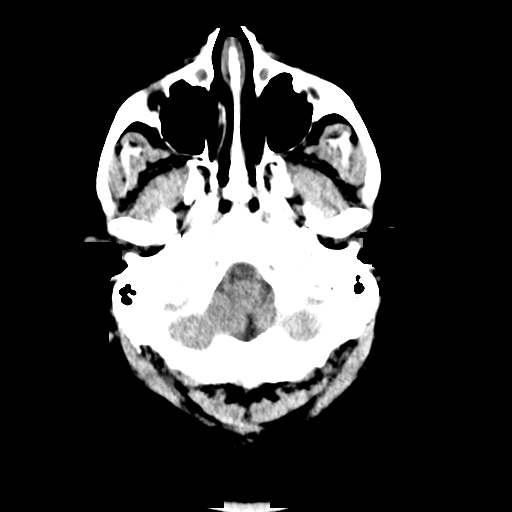
[im 5/35  bone]
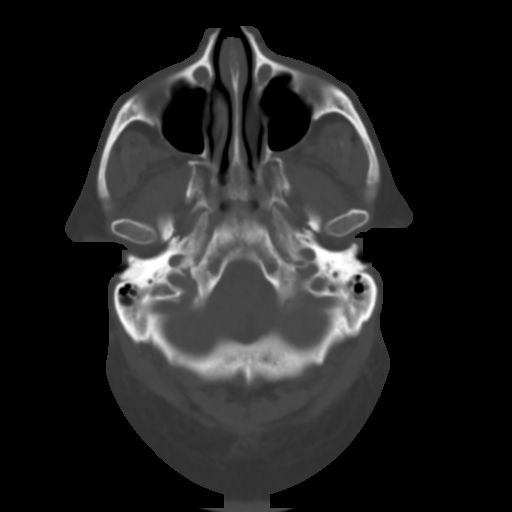
[im 9/35  brain]
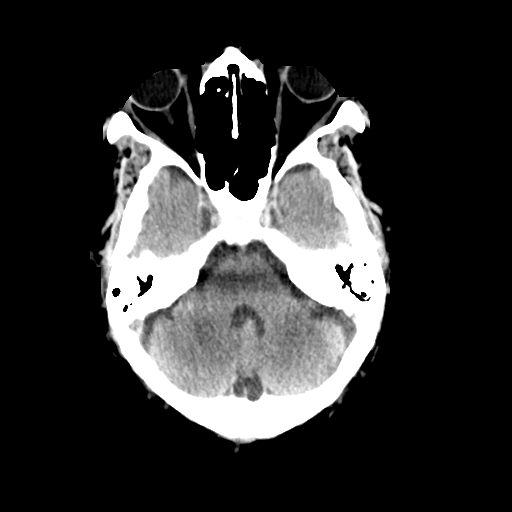
[im 13/35  brain]
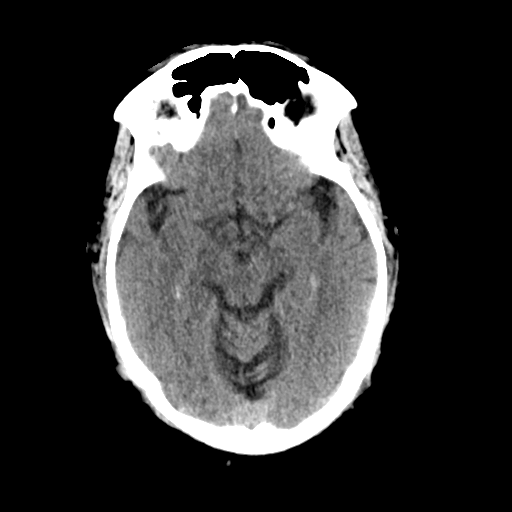
[im 18/35  brain]
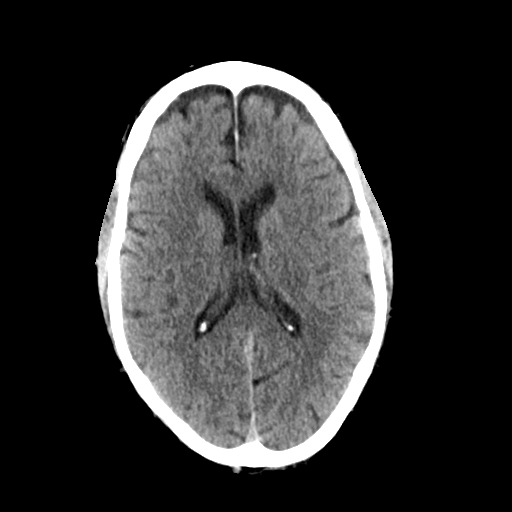
[im 22/35  brain]
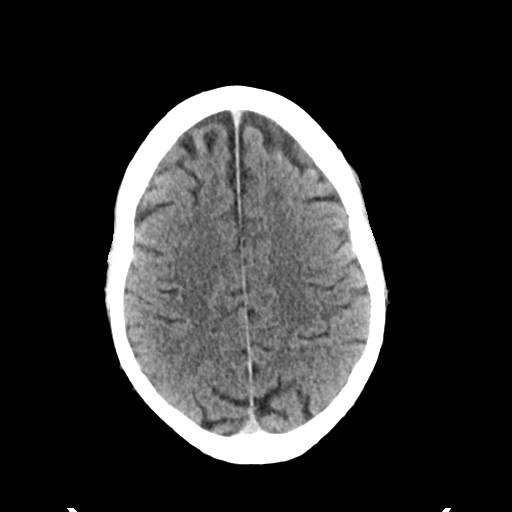
[im 22/35  bone]
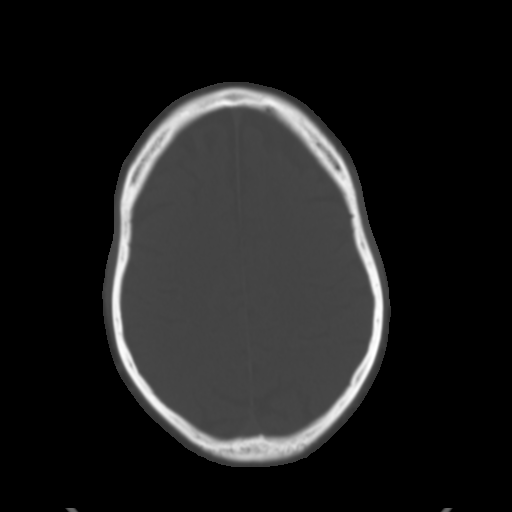
[im 26/35  brain]
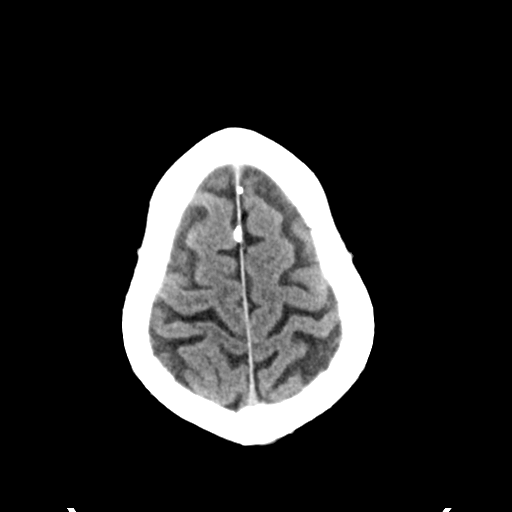
[im 30/35  brain]
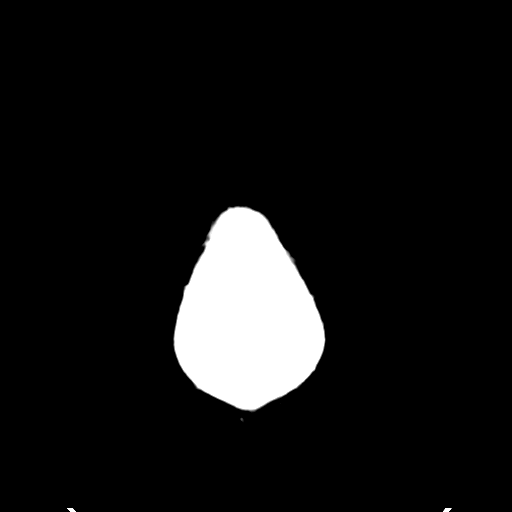

[Series 4: head bone · axial · 0.48mm/px · z∈[-206,-172]mm · 3 of 87 slices shown]
[im 9/87  bone]
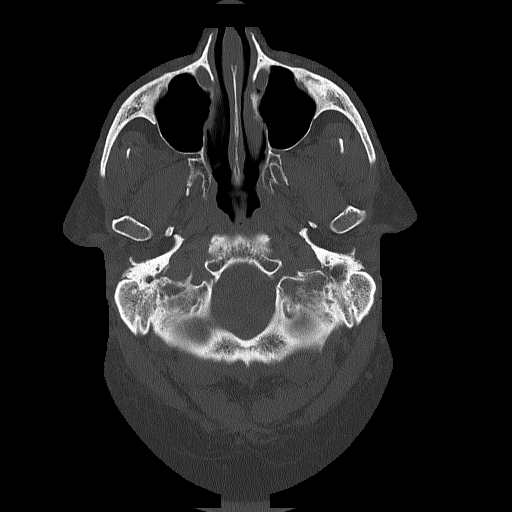
[im 18/87  bone]
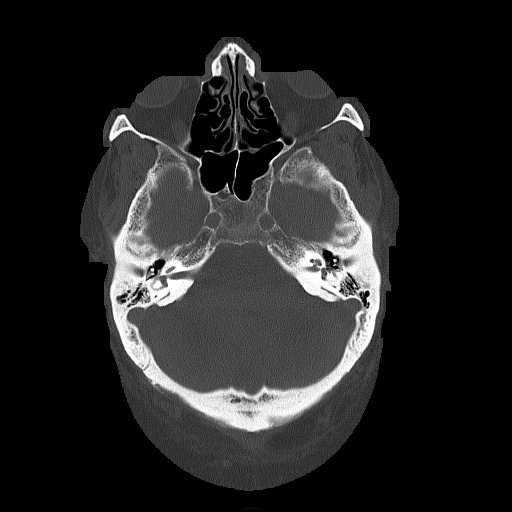
[im 26/87  bone]
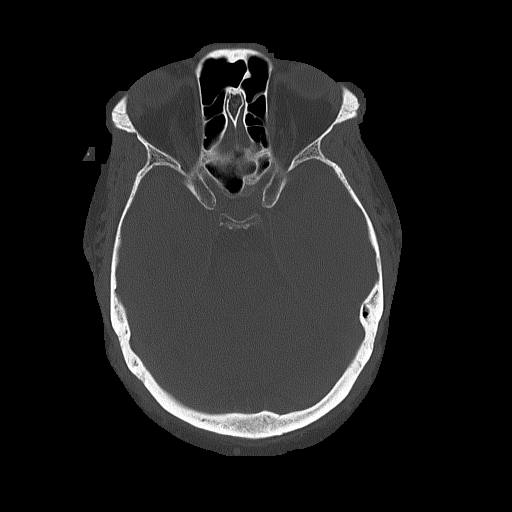

[Series 5: head without cor · coronal · non-contrast · 0.33mm/px · 3 of 73 slices shown]
[im 25/73  brain]
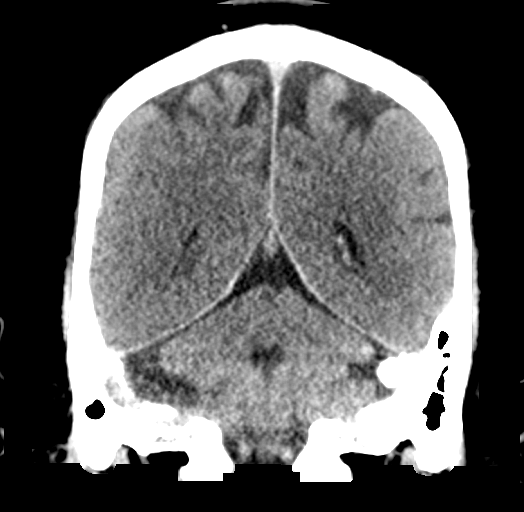
[im 33/73  brain]
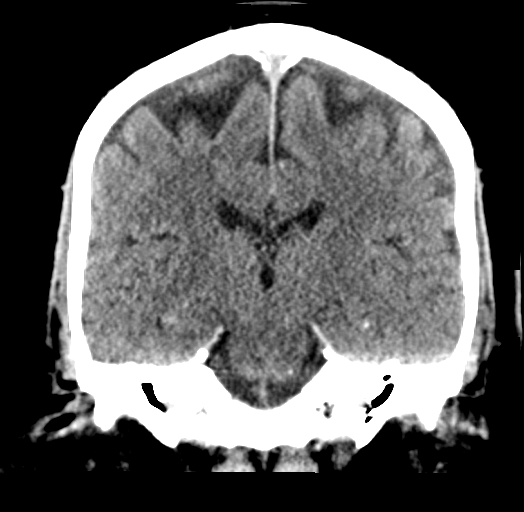
[im 41/73  brain]
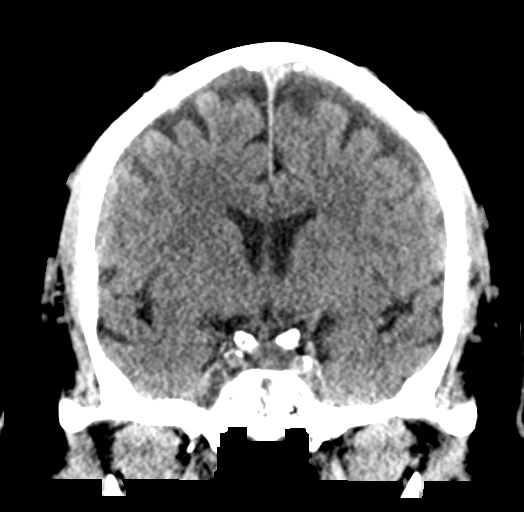

[Series 6: head without sag · sagittal · non-contrast · 0.29mm/px · 3 of 58 slices shown]
[im 20/58  brain]
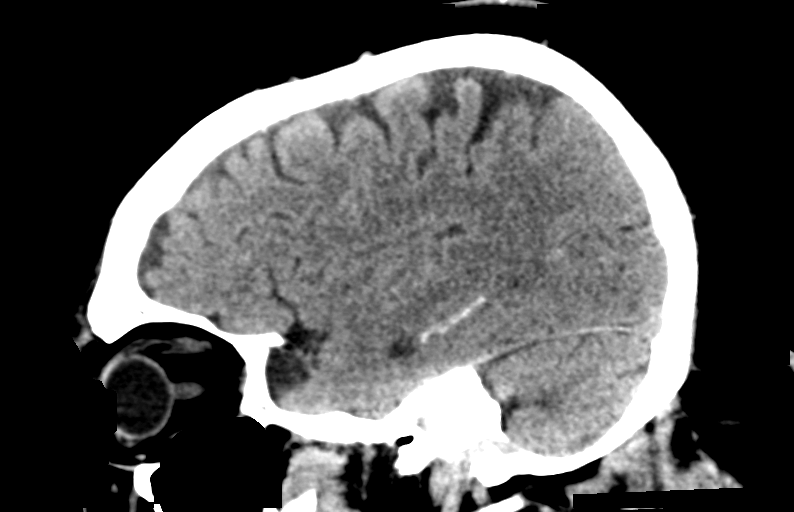
[im 29/58  brain]
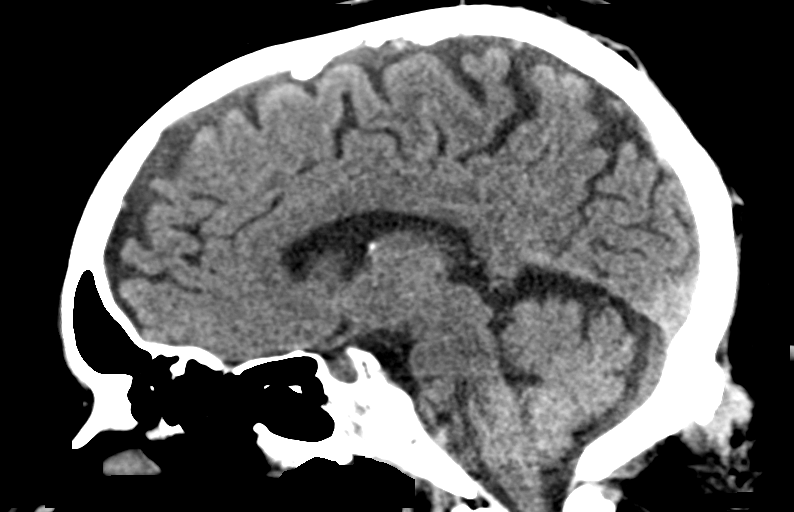
[im 39/58  brain]
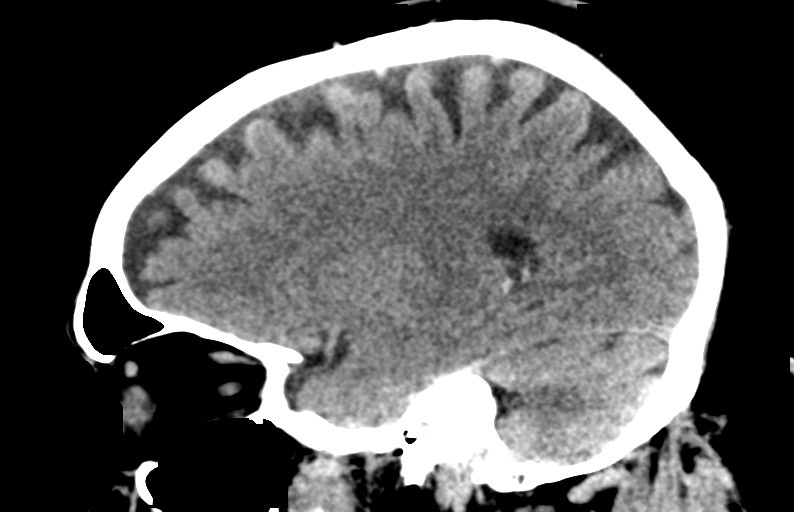

[16 of 47 positions shown; findings below may reference images not displayed]

FINDINGS: Brain: No evidence of acute infarction, hemorrhage, hydrocephalus,
extra-axial collection or mass lesion/mass effect.

Vascular: No hyperdense vessel or unexpected calcification.

Skull: Normal. Negative for fracture or focal lesion.

Sinuses/Orbits: No acute abnormality. Bilateral maxillary sinus
retention cysts versus polyps noted

Other: None
IMPRESSION: 1. No acute intracranial abnormalities.
2. Bilateral maxillary sinus retention cysts versus polyps.

## 2023-12-05 DIAGNOSIS — R739 Hyperglycemia, unspecified: Secondary | ICD-10-CM | POA: Diagnosis not present

## 2023-12-05 DIAGNOSIS — N186 End stage renal disease: Secondary | ICD-10-CM | POA: Diagnosis not present

## 2023-12-05 DIAGNOSIS — D509 Iron deficiency anemia, unspecified: Secondary | ICD-10-CM | POA: Diagnosis not present

## 2023-12-05 DIAGNOSIS — E1129 Type 2 diabetes mellitus with other diabetic kidney complication: Secondary | ICD-10-CM | POA: Diagnosis not present

## 2023-12-05 DIAGNOSIS — N2581 Secondary hyperparathyroidism of renal origin: Secondary | ICD-10-CM | POA: Diagnosis not present

## 2023-12-05 DIAGNOSIS — Z992 Dependence on renal dialysis: Secondary | ICD-10-CM | POA: Diagnosis not present

## 2023-12-08 DIAGNOSIS — N2581 Secondary hyperparathyroidism of renal origin: Secondary | ICD-10-CM | POA: Diagnosis not present

## 2023-12-08 DIAGNOSIS — E1129 Type 2 diabetes mellitus with other diabetic kidney complication: Secondary | ICD-10-CM | POA: Diagnosis not present

## 2023-12-08 DIAGNOSIS — Z992 Dependence on renal dialysis: Secondary | ICD-10-CM | POA: Diagnosis not present

## 2023-12-08 DIAGNOSIS — N186 End stage renal disease: Secondary | ICD-10-CM | POA: Diagnosis not present

## 2023-12-08 DIAGNOSIS — R739 Hyperglycemia, unspecified: Secondary | ICD-10-CM | POA: Diagnosis not present

## 2023-12-08 DIAGNOSIS — D509 Iron deficiency anemia, unspecified: Secondary | ICD-10-CM | POA: Diagnosis not present

## 2023-12-09 ENCOUNTER — Other Ambulatory Visit: Payer: Self-pay | Admitting: *Deleted

## 2023-12-09 DIAGNOSIS — N186 End stage renal disease: Secondary | ICD-10-CM

## 2023-12-10 ENCOUNTER — Other Ambulatory Visit: Payer: Self-pay

## 2023-12-10 ENCOUNTER — Other Ambulatory Visit: Payer: Self-pay | Admitting: Adult Health

## 2023-12-10 DIAGNOSIS — N186 End stage renal disease: Secondary | ICD-10-CM | POA: Diagnosis not present

## 2023-12-10 DIAGNOSIS — Z87898 Personal history of other specified conditions: Secondary | ICD-10-CM

## 2023-12-10 DIAGNOSIS — N2581 Secondary hyperparathyroidism of renal origin: Secondary | ICD-10-CM | POA: Diagnosis not present

## 2023-12-10 DIAGNOSIS — R739 Hyperglycemia, unspecified: Secondary | ICD-10-CM | POA: Diagnosis not present

## 2023-12-10 DIAGNOSIS — D509 Iron deficiency anemia, unspecified: Secondary | ICD-10-CM | POA: Diagnosis not present

## 2023-12-10 DIAGNOSIS — E1129 Type 2 diabetes mellitus with other diabetic kidney complication: Secondary | ICD-10-CM | POA: Diagnosis not present

## 2023-12-10 DIAGNOSIS — Z992 Dependence on renal dialysis: Secondary | ICD-10-CM | POA: Diagnosis not present

## 2023-12-12 ENCOUNTER — Other Ambulatory Visit: Payer: Self-pay

## 2023-12-12 DIAGNOSIS — Z992 Dependence on renal dialysis: Secondary | ICD-10-CM | POA: Diagnosis not present

## 2023-12-12 DIAGNOSIS — D509 Iron deficiency anemia, unspecified: Secondary | ICD-10-CM | POA: Diagnosis not present

## 2023-12-12 DIAGNOSIS — R739 Hyperglycemia, unspecified: Secondary | ICD-10-CM | POA: Diagnosis not present

## 2023-12-12 DIAGNOSIS — N186 End stage renal disease: Secondary | ICD-10-CM | POA: Diagnosis not present

## 2023-12-12 DIAGNOSIS — N2581 Secondary hyperparathyroidism of renal origin: Secondary | ICD-10-CM | POA: Diagnosis not present

## 2023-12-12 DIAGNOSIS — E1129 Type 2 diabetes mellitus with other diabetic kidney complication: Secondary | ICD-10-CM | POA: Diagnosis not present

## 2023-12-15 DIAGNOSIS — E1129 Type 2 diabetes mellitus with other diabetic kidney complication: Secondary | ICD-10-CM | POA: Diagnosis not present

## 2023-12-15 DIAGNOSIS — Z992 Dependence on renal dialysis: Secondary | ICD-10-CM | POA: Diagnosis not present

## 2023-12-15 DIAGNOSIS — N2581 Secondary hyperparathyroidism of renal origin: Secondary | ICD-10-CM | POA: Diagnosis not present

## 2023-12-15 DIAGNOSIS — N186 End stage renal disease: Secondary | ICD-10-CM | POA: Diagnosis not present

## 2023-12-15 DIAGNOSIS — D509 Iron deficiency anemia, unspecified: Secondary | ICD-10-CM | POA: Diagnosis not present

## 2023-12-15 DIAGNOSIS — R739 Hyperglycemia, unspecified: Secondary | ICD-10-CM | POA: Diagnosis not present

## 2023-12-16 ENCOUNTER — Other Ambulatory Visit: Payer: Self-pay | Admitting: Adult Health

## 2023-12-16 ENCOUNTER — Ambulatory Visit (INDEPENDENT_AMBULATORY_CARE_PROVIDER_SITE_OTHER): Payer: Medicare Other | Admitting: Podiatry

## 2023-12-16 DIAGNOSIS — M79674 Pain in right toe(s): Secondary | ICD-10-CM | POA: Diagnosis not present

## 2023-12-16 DIAGNOSIS — M79675 Pain in left toe(s): Secondary | ICD-10-CM

## 2023-12-16 DIAGNOSIS — B351 Tinea unguium: Secondary | ICD-10-CM

## 2023-12-16 NOTE — Progress Notes (Signed)
    Subjective:  Patient ID: Jeremy [name]., male    DOB: 1966-03-21,  MRN: [id number]  Jeremy [name]. presents to clinic today for:  Chief Complaint  Patient presents with   dfc    Nail trim    Diabetic patient with end-stage renal disease (on dialysis) presents noting his nails are thick, discolored, elongated and painful in shoegear when trying to ambulate.    PCP is Collins Dean, NP.  Past Medical History:  Diagnosis Date   Abscess    AKI (acute kidney injury) (HCC) 08/05/2017   Bell's palsy    right eye abn since dx bell's palsy   CKD (chronic kidney disease)    Dr. McDiarmind    COVID 2020   mild   Dyspnea    occasional with exertion   ESRD (end stage renal disease) (HCC)    MWF -[facility]   Headache    migraines   High cholesterol    Hypertension    Left shoulder pain 10/26/2013   S/p injection on 10/26/13    Renal insufficiency 02/14/2014   Seizures (HCC) 08/04/2017   "related to high blood sugars" (08/05/2017)   Type II diabetes mellitus (HCC)     Allergies  Allergen Reactions   Amlodipine  Other (See Comments)    Dizziness    Review of Systems: Negative except as noted in the HPI.  Objective:  Jeremy [name]. is a pleasant 58 y.o. male in NAD. AAO x 3.  Vascular Examination: Capillary refill time is 3-5 seconds to toes bilateral. Palpable pedal pulses b/l LE. Digital hair present b/l.  Skin temperature gradient WNL b/l. No varicosities b/l. No cyanosis noted b/l.   Dermatological Examination: Pedal skin with normal turgor, texture and tone b/l. No open wounds. No interdigital macerations b/l. Toenails x10 are 3mm thick, discolored, dystrophic with subungual debris. There is pain with compression of the nail plates.  They are elongated x10     Latest Ref Rng & Units 09/23/2023    8:52 AM 06/24/2023   10:43 AM 03/11/2023    9:04 AM  Hemoglobin A1C  Hemoglobin-A1c 0.0 - 7.0 % 5.7  5.6  6.8    Assessment/Plan: 1. Pain due to onychomycosis  of toenails of both feet     The mycotic toenails were sharply debrided x10 with sterile nail nippers and a power debriding burr to decrease bulk/thickness and length.    Return in about 3 months (around 03/17/2024) for [facility].   Joe Murders, DPM, FACFAS [facility]     [address] [place], [place] [address]                Office [phone]  Fax [phone]

## 2023-12-17 DIAGNOSIS — D509 Iron deficiency anemia, unspecified: Secondary | ICD-10-CM | POA: Diagnosis not present

## 2023-12-17 DIAGNOSIS — N2581 Secondary hyperparathyroidism of renal origin: Secondary | ICD-10-CM | POA: Diagnosis not present

## 2023-12-17 DIAGNOSIS — Z992 Dependence on renal dialysis: Secondary | ICD-10-CM | POA: Diagnosis not present

## 2023-12-17 DIAGNOSIS — R739 Hyperglycemia, unspecified: Secondary | ICD-10-CM | POA: Diagnosis not present

## 2023-12-17 DIAGNOSIS — E1129 Type 2 diabetes mellitus with other diabetic kidney complication: Secondary | ICD-10-CM | POA: Diagnosis not present

## 2023-12-17 DIAGNOSIS — N186 End stage renal disease: Secondary | ICD-10-CM | POA: Diagnosis not present

## 2023-12-18 ENCOUNTER — Ambulatory Visit (HOSPITAL_COMMUNITY)

## 2023-12-18 ENCOUNTER — Ambulatory Visit

## 2023-12-18 ENCOUNTER — Other Ambulatory Visit: Payer: Self-pay | Admitting: Adult Health

## 2023-12-18 ENCOUNTER — Other Ambulatory Visit: Payer: Self-pay

## 2023-12-18 DIAGNOSIS — Z87898 Personal history of other specified conditions: Secondary | ICD-10-CM

## 2023-12-18 MED ORDER — LEVETIRACETAM ER 500 MG PO TB24: 1000.0000 mg | ORAL_TABLET | Freq: Every day | ORAL | 0 refills | Status: DC

## 2023-12-18 NOTE — Telephone Encounter (Signed)
 Pt called stating Pharmacy has not receive medication and was out . Informed Pt medication is beng Processed levETIRAcetam  (KEPPRA  XR) 500 MG 24 hr tablet   Pt would like medication sent to :   [id number] - [place], [place] - [address] (Ph: [phone])

## 2023-12-18 NOTE — Telephone Encounter (Signed)
 Follow up scheduled on 03/03/24 "Continue Keppra  500 mg extended release-2 tablets daily for total of 1000 mg daily "

## 2023-12-19 DIAGNOSIS — R739 Hyperglycemia, unspecified: Secondary | ICD-10-CM | POA: Diagnosis not present

## 2023-12-19 DIAGNOSIS — E1129 Type 2 diabetes mellitus with other diabetic kidney complication: Secondary | ICD-10-CM | POA: Diagnosis not present

## 2023-12-19 DIAGNOSIS — Z992 Dependence on renal dialysis: Secondary | ICD-10-CM | POA: Diagnosis not present

## 2023-12-19 DIAGNOSIS — N2581 Secondary hyperparathyroidism of renal origin: Secondary | ICD-10-CM | POA: Diagnosis not present

## 2023-12-19 DIAGNOSIS — D509 Iron deficiency anemia, unspecified: Secondary | ICD-10-CM | POA: Diagnosis not present

## 2023-12-19 DIAGNOSIS — N186 End stage renal disease: Secondary | ICD-10-CM | POA: Diagnosis not present

## 2023-12-20 DIAGNOSIS — N186 End stage renal disease: Secondary | ICD-10-CM | POA: Diagnosis not present

## 2023-12-20 DIAGNOSIS — I129 Hypertensive chronic kidney disease with stage 1 through stage 4 chronic kidney disease, or unspecified chronic kidney disease: Secondary | ICD-10-CM | POA: Diagnosis not present

## 2023-12-20 DIAGNOSIS — Z992 Dependence on renal dialysis: Secondary | ICD-10-CM | POA: Diagnosis not present

## 2023-12-22 DIAGNOSIS — D509 Iron deficiency anemia, unspecified: Secondary | ICD-10-CM | POA: Diagnosis not present

## 2023-12-22 DIAGNOSIS — D631 Anemia in chronic kidney disease: Secondary | ICD-10-CM | POA: Diagnosis not present

## 2023-12-22 DIAGNOSIS — N2581 Secondary hyperparathyroidism of renal origin: Secondary | ICD-10-CM | POA: Diagnosis not present

## 2023-12-22 DIAGNOSIS — E1129 Type 2 diabetes mellitus with other diabetic kidney complication: Secondary | ICD-10-CM | POA: Diagnosis not present

## 2023-12-22 DIAGNOSIS — N186 End stage renal disease: Secondary | ICD-10-CM | POA: Diagnosis not present

## 2023-12-22 DIAGNOSIS — Z992 Dependence on renal dialysis: Secondary | ICD-10-CM | POA: Diagnosis not present

## 2023-12-24 ENCOUNTER — Telehealth: Payer: Self-pay | Admitting: Adult Health

## 2023-12-24 ENCOUNTER — Other Ambulatory Visit: Payer: Self-pay

## 2023-12-24 DIAGNOSIS — Z1330 Encounter for screening examination for mental health and behavioral disorders, unspecified: Secondary | ICD-10-CM | POA: Diagnosis not present

## 2023-12-24 DIAGNOSIS — E1129 Type 2 diabetes mellitus with other diabetic kidney complication: Secondary | ICD-10-CM | POA: Diagnosis not present

## 2023-12-24 DIAGNOSIS — D509 Iron deficiency anemia, unspecified: Secondary | ICD-10-CM | POA: Diagnosis not present

## 2023-12-24 DIAGNOSIS — D631 Anemia in chronic kidney disease: Secondary | ICD-10-CM | POA: Diagnosis not present

## 2023-12-24 DIAGNOSIS — M48062 Spinal stenosis, lumbar region with neurogenic claudication: Secondary | ICD-10-CM | POA: Diagnosis not present

## 2023-12-24 DIAGNOSIS — M5416 Radiculopathy, lumbar region: Secondary | ICD-10-CM | POA: Diagnosis not present

## 2023-12-24 DIAGNOSIS — G894 Chronic pain syndrome: Secondary | ICD-10-CM | POA: Diagnosis not present

## 2023-12-24 DIAGNOSIS — N186 End stage renal disease: Secondary | ICD-10-CM | POA: Diagnosis not present

## 2023-12-24 DIAGNOSIS — Z992 Dependence on renal dialysis: Secondary | ICD-10-CM | POA: Diagnosis not present

## 2023-12-24 DIAGNOSIS — N2581 Secondary hyperparathyroidism of renal origin: Secondary | ICD-10-CM | POA: Diagnosis not present

## 2023-12-24 MED ORDER — LEVETIRACETAM ER 500 MG PO TB24
1000.0000 mg | ORAL_TABLET | Freq: Every day | ORAL | 0 refills | Status: AC
  Filled 2023-12-24 – 2023-12-29 (×9): qty 180, 90d supply, fill #0

## 2023-12-24 NOTE — Telephone Encounter (Signed)
 Handled in other encounter.

## 2023-12-24 NOTE — Telephone Encounter (Signed)
 Pt called in regards to MEDICATION ( levETIRAcetam  (KEPPRA  XR) 500 MG 24 hr tablet) . Pt states that  [id number] - [place], [place] - [address] ) is charging $161 for him to receive medication . Pt would like a new Rx be sent  to another Pharmacy where he can get a cheaper deal .   Pt would like medication sent to :[facility]   Medication was last received by [facility] on 12/18/2023 at 1:38 pm   [id number] - [place], [place] - [address] (Ph: [phone])

## 2023-12-24 NOTE — Telephone Encounter (Signed)
 [facility] not [facility]  there. I informed them to put it back on shelf as he has requested another pharmacy. I sent a 3 month supply of keppra  xr 500 mg to [facility] at pt's request.

## 2023-12-24 NOTE — Telephone Encounter (Signed)
 Pt called stating that he is out of his seizure medication and the pharmacy is telling him that the never received the refill request. Please advise.

## 2023-12-24 NOTE — Telephone Encounter (Signed)
 Received phone note stating patient would like Rx sent to [facility]. [facility].

## 2023-12-26 DIAGNOSIS — D631 Anemia in chronic kidney disease: Secondary | ICD-10-CM | POA: Diagnosis not present

## 2023-12-26 DIAGNOSIS — N2581 Secondary hyperparathyroidism of renal origin: Secondary | ICD-10-CM | POA: Diagnosis not present

## 2023-12-26 DIAGNOSIS — D509 Iron deficiency anemia, unspecified: Secondary | ICD-10-CM | POA: Diagnosis not present

## 2023-12-26 DIAGNOSIS — N186 End stage renal disease: Secondary | ICD-10-CM | POA: Diagnosis not present

## 2023-12-26 DIAGNOSIS — Z992 Dependence on renal dialysis: Secondary | ICD-10-CM | POA: Diagnosis not present

## 2023-12-26 DIAGNOSIS — E1129 Type 2 diabetes mellitus with other diabetic kidney complication: Secondary | ICD-10-CM | POA: Diagnosis not present

## 2023-12-29 ENCOUNTER — Other Ambulatory Visit: Payer: Self-pay

## 2023-12-30 ENCOUNTER — Other Ambulatory Visit: Payer: Self-pay

## 2023-12-30 DIAGNOSIS — E1142 Type 2 diabetes mellitus with diabetic polyneuropathy: Secondary | ICD-10-CM | POA: Diagnosis not present

## 2023-12-30 DIAGNOSIS — M47816 Spondylosis without myelopathy or radiculopathy, lumbar region: Secondary | ICD-10-CM | POA: Diagnosis not present

## 2023-12-30 DIAGNOSIS — Z79899 Other long term (current) drug therapy: Secondary | ICD-10-CM | POA: Diagnosis not present

## 2023-12-30 DIAGNOSIS — R52 Pain, unspecified: Secondary | ICD-10-CM | POA: Diagnosis not present

## 2023-12-30 DIAGNOSIS — M25552 Pain in left hip: Secondary | ICD-10-CM | POA: Diagnosis not present

## 2023-12-30 DIAGNOSIS — Z5181 Encounter for therapeutic drug level monitoring: Secondary | ICD-10-CM | POA: Diagnosis not present

## 2023-12-30 MED ORDER — JOURNAVX 50 MG PO TABS
ORAL_TABLET | ORAL | 0 refills | Status: DC
  Filled 2023-12-30: qty 60, 30d supply, fill #0

## 2023-12-31 ENCOUNTER — Other Ambulatory Visit: Payer: Self-pay

## 2023-12-31 DIAGNOSIS — D631 Anemia in chronic kidney disease: Secondary | ICD-10-CM | POA: Diagnosis not present

## 2023-12-31 DIAGNOSIS — N2581 Secondary hyperparathyroidism of renal origin: Secondary | ICD-10-CM | POA: Diagnosis not present

## 2023-12-31 DIAGNOSIS — Z992 Dependence on renal dialysis: Secondary | ICD-10-CM | POA: Diagnosis not present

## 2023-12-31 DIAGNOSIS — D509 Iron deficiency anemia, unspecified: Secondary | ICD-10-CM | POA: Diagnosis not present

## 2023-12-31 DIAGNOSIS — E1129 Type 2 diabetes mellitus with other diabetic kidney complication: Secondary | ICD-10-CM | POA: Diagnosis not present

## 2023-12-31 DIAGNOSIS — N186 End stage renal disease: Secondary | ICD-10-CM | POA: Diagnosis not present

## 2024-01-02 DIAGNOSIS — N186 End stage renal disease: Secondary | ICD-10-CM | POA: Diagnosis not present

## 2024-01-02 DIAGNOSIS — Z992 Dependence on renal dialysis: Secondary | ICD-10-CM | POA: Diagnosis not present

## 2024-01-02 DIAGNOSIS — N2581 Secondary hyperparathyroidism of renal origin: Secondary | ICD-10-CM | POA: Diagnosis not present

## 2024-01-02 DIAGNOSIS — D631 Anemia in chronic kidney disease: Secondary | ICD-10-CM | POA: Diagnosis not present

## 2024-01-02 DIAGNOSIS — E1129 Type 2 diabetes mellitus with other diabetic kidney complication: Secondary | ICD-10-CM | POA: Diagnosis not present

## 2024-01-02 DIAGNOSIS — D509 Iron deficiency anemia, unspecified: Secondary | ICD-10-CM | POA: Diagnosis not present

## 2024-01-05 DIAGNOSIS — D631 Anemia in chronic kidney disease: Secondary | ICD-10-CM | POA: Diagnosis not present

## 2024-01-05 DIAGNOSIS — D509 Iron deficiency anemia, unspecified: Secondary | ICD-10-CM | POA: Diagnosis not present

## 2024-01-05 DIAGNOSIS — Z992 Dependence on renal dialysis: Secondary | ICD-10-CM | POA: Diagnosis not present

## 2024-01-05 DIAGNOSIS — N186 End stage renal disease: Secondary | ICD-10-CM | POA: Diagnosis not present

## 2024-01-05 DIAGNOSIS — E1129 Type 2 diabetes mellitus with other diabetic kidney complication: Secondary | ICD-10-CM | POA: Diagnosis not present

## 2024-01-05 DIAGNOSIS — N2581 Secondary hyperparathyroidism of renal origin: Secondary | ICD-10-CM | POA: Diagnosis not present

## 2024-01-05 NOTE — Progress Notes (Signed)
 S:     No chief complaint on file.  58 y.o. male who presents for diabetes evaluation, education, and management.  PMH is significant for T2DM w/ ESRD (MWF HD), anemia of chronic disease, resistant HTN, OSA, HLD, obesity, epilepsy. Patient was referred and last seen by Zelda on 08/26/2023. He was last seen by pharmacy on 11/25/2023.  I've been involved in his care since 09/2021. In March of this year, we checked labs. Fructoasmine showed A1c is actually around ~6.6% which is slightly higher than his POCT glycosylated hemoglobin of 5.7%. I did not make any DM medication changes. He is currently managing DM without medication. Tells me that his home BG runs around 120-130s in the morning, controlled.   Regarding his HTN, his BP fluctuates d/t HD. For this reason, it is difficult to adjust his antihypertensives. Today, patient arrives in good spirits and presents without any assistance. He has no complaints. Does tell me that his BP is dropping more during HD but his nephrologist is adjusting his flow rate to see if that helps.   Family/Social History:  -Fhx: DM, heart disease, seizure disorder, stroke -Tobacco: never smoker  -Alcohol: none reported   Current diabetes medications include: none Current hypertension medications include: amlodipine  5mg  daily (only taking on non-dialysis days), enalapril  5 mg (only taking on non-dialysis days) Current HLD medications include: atorvastatin  40 mg daily  Insurance coverage: BCBS Medicare, Family Planning Medicaid  Patient denies nocturia (nighttime urination).  Patient reports neuropathy (nerve pain). Patient denies visual changes. Patient reports self foot exams.   Patient reported no changes from previous dietary habits:  -Adherent to sodium restriction. -Has stopped drinking regular Pepsi and Mt. Dew daily. -Limits carbohydrates and tries to avoid sweets.  Patient-reported exercise habits: walking and yard work  O:   Lab Results   Component Value Date   HGBA1C 6.0 01/06/2024   Vitals:   01/06/24 0845  BP: 127/74  Pulse: 71    Lipid Panel     Component Value Date/Time   CHOL 159 09/23/2023 0918   TRIG 77 09/23/2023 0918   HDL 46 09/23/2023 0918   CHOLHDL 3.5 09/23/2023 0918   CHOLHDL 3.1 02/09/2016 1045   VLDL 11 02/09/2016 1045   LDLCALC 98 09/23/2023 0918   Clinical Atherosclerotic Cardiovascular Disease (ASCVD): No  The 10-year ASCVD risk score (Arnett DK, et al., 2019) is: 20.4%   Values used to calculate the score:     Age: 19 years     Clincally relevant sex: Male     Is Non-Hispanic African American: Yes     Diabetic: Yes     Tobacco smoker: No     Systolic Blood Pressure: 127 mmHg     Is BP treated: Yes     HDL Cholesterol: 46 mg/dL     Total Cholesterol: 159 mg/dL   A/P: Diabetes longstanding currently at goal. Home sugars are at goal. Patient is able to verbalize appropriate hypoglycemia management plan but is not having hypoglycemia at this time. He has not taken insulin  in at least the last several months. With his ESRD/HD hx, we will get a fructosamine as A1c can be falsely low in this setting. -Continue management with lifestyle alone.  -Obtain fructosamine and CBC today.  -Extensively discussed pathophysiology of diabetes, recommended lifestyle interventions, dietary effects on blood sugar control.  -Counseled on s/sx of and management of hypoglycemia.  -Next A1c anticipated 03/2024.  Hypertension longstanding currently close to goal. Blood pressure goal of <130/80  mmHg. BP management is difficult with his HD. Patient did not take amlodipine  and enalapril  today.  -Continue enalapril  5 mg daily on non-HD -Continue amlodipine  5 mg daily on non-HD days   Written patient instructions provided. Patient verbalized understanding of treatment plan.  Total time in face to face counseling 20 minutes.    Follow-up:  Pharmacist: 2 months PCP: not scheduled -- encouraged to schedule an  appointment in 1 month  Josefa Range, PharmD PGY1 Pharmacy Resident

## 2024-01-06 ENCOUNTER — Ambulatory Visit: Attending: Family Medicine | Admitting: Pharmacist

## 2024-01-06 ENCOUNTER — Encounter: Payer: Self-pay | Admitting: Pharmacist

## 2024-01-06 VITALS — BP 127/74 | HR 71

## 2024-01-06 DIAGNOSIS — Z992 Dependence on renal dialysis: Secondary | ICD-10-CM | POA: Diagnosis not present

## 2024-01-06 DIAGNOSIS — D631 Anemia in chronic kidney disease: Secondary | ICD-10-CM | POA: Diagnosis not present

## 2024-01-06 DIAGNOSIS — E1151 Type 2 diabetes mellitus with diabetic peripheral angiopathy without gangrene: Secondary | ICD-10-CM

## 2024-01-06 DIAGNOSIS — I10 Essential (primary) hypertension: Secondary | ICD-10-CM | POA: Diagnosis not present

## 2024-01-06 DIAGNOSIS — N186 End stage renal disease: Secondary | ICD-10-CM | POA: Diagnosis not present

## 2024-01-06 LAB — POCT GLYCOSYLATED HEMOGLOBIN (HGB A1C): HbA1c, POC (controlled diabetic range): 6 % (ref 0.0–7.0)

## 2024-01-07 ENCOUNTER — Telehealth: Payer: Self-pay | Admitting: Pharmacist

## 2024-01-07 ENCOUNTER — Ambulatory Visit: Payer: Self-pay | Admitting: Internal Medicine

## 2024-01-07 DIAGNOSIS — D631 Anemia in chronic kidney disease: Secondary | ICD-10-CM | POA: Diagnosis not present

## 2024-01-07 DIAGNOSIS — D509 Iron deficiency anemia, unspecified: Secondary | ICD-10-CM | POA: Diagnosis not present

## 2024-01-07 DIAGNOSIS — Z992 Dependence on renal dialysis: Secondary | ICD-10-CM | POA: Diagnosis not present

## 2024-01-07 DIAGNOSIS — N186 End stage renal disease: Secondary | ICD-10-CM | POA: Diagnosis not present

## 2024-01-07 DIAGNOSIS — E1129 Type 2 diabetes mellitus with other diabetic kidney complication: Secondary | ICD-10-CM | POA: Diagnosis not present

## 2024-01-07 DIAGNOSIS — N2581 Secondary hyperparathyroidism of renal origin: Secondary | ICD-10-CM | POA: Diagnosis not present

## 2024-01-07 LAB — CBC
Hematocrit: 38.2 % (ref 37.5–51.0)
Hemoglobin: 11.8 g/dL — ABNORMAL LOW (ref 13.0–17.7)
MCH: 26.2 pg — ABNORMAL LOW (ref 26.6–33.0)
MCHC: 30.9 g/dL — ABNORMAL LOW (ref 31.5–35.7)
MCV: 85 fL (ref 79–97)
Platelets: 235 10*3/uL (ref 150–450)
RBC: 4.5 x10E6/uL (ref 4.14–5.80)
RDW: 15.3 % (ref 11.6–15.4)
WBC: 6.5 10*3/uL (ref 3.4–10.8)

## 2024-01-07 LAB — FRUCTOSAMINE: Fructosamine: 324 umol/L — ABNORMAL HIGH (ref 0–285)

## 2024-01-07 NOTE — Telephone Encounter (Signed)
 Fructosamine results received. His fructosamine shows an increase from when we last checked. Even though his A1c is 6.0, he has anemia in the setting of ESRD. This makes the A1c falsely low and fructosamine is a better marker for glycemic control in ESRD/HD patients. His fructosamine shows an A1c closer to an estimated 7.1%.   Call placed to patient to discuss this. Confirmed his identity using 2 unique identifiers. Introduced myself and the reason for my call.   Explained the above information to the patient. His DM has been diet controlled for a little while now. His fructosamine shows an A1c that's just slightly above 7%. I explained to him with his ESRD and consideration for kidney transplantation in the future, we will need to maintain strict control. He may continue to control with lifestyle for now. Confirms he has been adhering to a strict diet at home. He sees me again 02/05/24 for follow-up. Will plan on repeat fructosamine then (these tests show ~3 week glycemic turnaround). He knows that if the result is above 7% we will need to add back a low dose basal insulin . Patient verbalized understanding of the plan and thanked me for my call.

## 2024-01-09 DIAGNOSIS — N186 End stage renal disease: Secondary | ICD-10-CM | POA: Diagnosis not present

## 2024-01-09 DIAGNOSIS — Z992 Dependence on renal dialysis: Secondary | ICD-10-CM | POA: Diagnosis not present

## 2024-01-09 DIAGNOSIS — D631 Anemia in chronic kidney disease: Secondary | ICD-10-CM | POA: Diagnosis not present

## 2024-01-09 DIAGNOSIS — N2581 Secondary hyperparathyroidism of renal origin: Secondary | ICD-10-CM | POA: Diagnosis not present

## 2024-01-09 DIAGNOSIS — D509 Iron deficiency anemia, unspecified: Secondary | ICD-10-CM | POA: Diagnosis not present

## 2024-01-09 DIAGNOSIS — E1129 Type 2 diabetes mellitus with other diabetic kidney complication: Secondary | ICD-10-CM | POA: Diagnosis not present

## 2024-01-12 DIAGNOSIS — N2581 Secondary hyperparathyroidism of renal origin: Secondary | ICD-10-CM | POA: Diagnosis not present

## 2024-01-12 DIAGNOSIS — D509 Iron deficiency anemia, unspecified: Secondary | ICD-10-CM | POA: Diagnosis not present

## 2024-01-12 DIAGNOSIS — N186 End stage renal disease: Secondary | ICD-10-CM | POA: Diagnosis not present

## 2024-01-12 DIAGNOSIS — E1129 Type 2 diabetes mellitus with other diabetic kidney complication: Secondary | ICD-10-CM | POA: Diagnosis not present

## 2024-01-12 DIAGNOSIS — D631 Anemia in chronic kidney disease: Secondary | ICD-10-CM | POA: Diagnosis not present

## 2024-01-12 DIAGNOSIS — Z992 Dependence on renal dialysis: Secondary | ICD-10-CM | POA: Diagnosis not present

## 2024-01-14 DIAGNOSIS — D509 Iron deficiency anemia, unspecified: Secondary | ICD-10-CM | POA: Diagnosis not present

## 2024-01-14 DIAGNOSIS — N2581 Secondary hyperparathyroidism of renal origin: Secondary | ICD-10-CM | POA: Diagnosis not present

## 2024-01-14 DIAGNOSIS — E1129 Type 2 diabetes mellitus with other diabetic kidney complication: Secondary | ICD-10-CM | POA: Diagnosis not present

## 2024-01-14 DIAGNOSIS — Z992 Dependence on renal dialysis: Secondary | ICD-10-CM | POA: Diagnosis not present

## 2024-01-14 DIAGNOSIS — N186 End stage renal disease: Secondary | ICD-10-CM | POA: Diagnosis not present

## 2024-01-14 DIAGNOSIS — D631 Anemia in chronic kidney disease: Secondary | ICD-10-CM | POA: Diagnosis not present

## 2024-01-16 DIAGNOSIS — D631 Anemia in chronic kidney disease: Secondary | ICD-10-CM | POA: Diagnosis not present

## 2024-01-16 DIAGNOSIS — N186 End stage renal disease: Secondary | ICD-10-CM | POA: Diagnosis not present

## 2024-01-16 DIAGNOSIS — N2581 Secondary hyperparathyroidism of renal origin: Secondary | ICD-10-CM | POA: Diagnosis not present

## 2024-01-16 DIAGNOSIS — D509 Iron deficiency anemia, unspecified: Secondary | ICD-10-CM | POA: Diagnosis not present

## 2024-01-16 DIAGNOSIS — Z992 Dependence on renal dialysis: Secondary | ICD-10-CM | POA: Diagnosis not present

## 2024-01-16 DIAGNOSIS — E1129 Type 2 diabetes mellitus with other diabetic kidney complication: Secondary | ICD-10-CM | POA: Diagnosis not present

## 2024-01-19 DIAGNOSIS — I129 Hypertensive chronic kidney disease with stage 1 through stage 4 chronic kidney disease, or unspecified chronic kidney disease: Secondary | ICD-10-CM | POA: Diagnosis not present

## 2024-01-19 DIAGNOSIS — N186 End stage renal disease: Secondary | ICD-10-CM | POA: Diagnosis not present

## 2024-01-19 DIAGNOSIS — E1129 Type 2 diabetes mellitus with other diabetic kidney complication: Secondary | ICD-10-CM | POA: Diagnosis not present

## 2024-01-19 DIAGNOSIS — N2581 Secondary hyperparathyroidism of renal origin: Secondary | ICD-10-CM | POA: Diagnosis not present

## 2024-01-19 DIAGNOSIS — Z992 Dependence on renal dialysis: Secondary | ICD-10-CM | POA: Diagnosis not present

## 2024-01-19 DIAGNOSIS — D631 Anemia in chronic kidney disease: Secondary | ICD-10-CM | POA: Diagnosis not present

## 2024-01-19 DIAGNOSIS — D509 Iron deficiency anemia, unspecified: Secondary | ICD-10-CM | POA: Diagnosis not present

## 2024-01-23 DIAGNOSIS — Z992 Dependence on renal dialysis: Secondary | ICD-10-CM | POA: Diagnosis not present

## 2024-01-23 DIAGNOSIS — N186 End stage renal disease: Secondary | ICD-10-CM | POA: Diagnosis not present

## 2024-01-23 DIAGNOSIS — E1129 Type 2 diabetes mellitus with other diabetic kidney complication: Secondary | ICD-10-CM | POA: Diagnosis not present

## 2024-01-23 DIAGNOSIS — R739 Hyperglycemia, unspecified: Secondary | ICD-10-CM | POA: Diagnosis not present

## 2024-01-23 DIAGNOSIS — N2581 Secondary hyperparathyroidism of renal origin: Secondary | ICD-10-CM | POA: Diagnosis not present

## 2024-01-23 DIAGNOSIS — D509 Iron deficiency anemia, unspecified: Secondary | ICD-10-CM | POA: Diagnosis not present

## 2024-01-26 DIAGNOSIS — R739 Hyperglycemia, unspecified: Secondary | ICD-10-CM | POA: Diagnosis not present

## 2024-01-26 DIAGNOSIS — N186 End stage renal disease: Secondary | ICD-10-CM | POA: Diagnosis not present

## 2024-01-26 DIAGNOSIS — E1129 Type 2 diabetes mellitus with other diabetic kidney complication: Secondary | ICD-10-CM | POA: Diagnosis not present

## 2024-01-26 DIAGNOSIS — Z992 Dependence on renal dialysis: Secondary | ICD-10-CM | POA: Diagnosis not present

## 2024-01-26 DIAGNOSIS — D509 Iron deficiency anemia, unspecified: Secondary | ICD-10-CM | POA: Diagnosis not present

## 2024-01-26 DIAGNOSIS — N2581 Secondary hyperparathyroidism of renal origin: Secondary | ICD-10-CM | POA: Diagnosis not present

## 2024-01-28 DIAGNOSIS — D509 Iron deficiency anemia, unspecified: Secondary | ICD-10-CM | POA: Diagnosis not present

## 2024-01-28 DIAGNOSIS — Z992 Dependence on renal dialysis: Secondary | ICD-10-CM | POA: Diagnosis not present

## 2024-01-28 DIAGNOSIS — N2581 Secondary hyperparathyroidism of renal origin: Secondary | ICD-10-CM | POA: Diagnosis not present

## 2024-01-28 DIAGNOSIS — E1129 Type 2 diabetes mellitus with other diabetic kidney complication: Secondary | ICD-10-CM | POA: Diagnosis not present

## 2024-01-28 DIAGNOSIS — R739 Hyperglycemia, unspecified: Secondary | ICD-10-CM | POA: Diagnosis not present

## 2024-01-28 DIAGNOSIS — N186 End stage renal disease: Secondary | ICD-10-CM | POA: Diagnosis not present

## 2024-01-30 DIAGNOSIS — N2581 Secondary hyperparathyroidism of renal origin: Secondary | ICD-10-CM | POA: Diagnosis not present

## 2024-01-30 DIAGNOSIS — N186 End stage renal disease: Secondary | ICD-10-CM | POA: Diagnosis not present

## 2024-01-30 DIAGNOSIS — Z992 Dependence on renal dialysis: Secondary | ICD-10-CM | POA: Diagnosis not present

## 2024-01-30 DIAGNOSIS — E1129 Type 2 diabetes mellitus with other diabetic kidney complication: Secondary | ICD-10-CM | POA: Diagnosis not present

## 2024-01-30 DIAGNOSIS — R739 Hyperglycemia, unspecified: Secondary | ICD-10-CM | POA: Diagnosis not present

## 2024-01-30 DIAGNOSIS — D509 Iron deficiency anemia, unspecified: Secondary | ICD-10-CM | POA: Diagnosis not present

## 2024-02-02 DIAGNOSIS — E1129 Type 2 diabetes mellitus with other diabetic kidney complication: Secondary | ICD-10-CM | POA: Diagnosis not present

## 2024-02-02 DIAGNOSIS — N186 End stage renal disease: Secondary | ICD-10-CM | POA: Diagnosis not present

## 2024-02-02 DIAGNOSIS — Z992 Dependence on renal dialysis: Secondary | ICD-10-CM | POA: Diagnosis not present

## 2024-02-02 DIAGNOSIS — D509 Iron deficiency anemia, unspecified: Secondary | ICD-10-CM | POA: Diagnosis not present

## 2024-02-02 DIAGNOSIS — N2581 Secondary hyperparathyroidism of renal origin: Secondary | ICD-10-CM | POA: Diagnosis not present

## 2024-02-02 DIAGNOSIS — R739 Hyperglycemia, unspecified: Secondary | ICD-10-CM | POA: Diagnosis not present

## 2024-02-04 DIAGNOSIS — E1129 Type 2 diabetes mellitus with other diabetic kidney complication: Secondary | ICD-10-CM | POA: Diagnosis not present

## 2024-02-04 DIAGNOSIS — D509 Iron deficiency anemia, unspecified: Secondary | ICD-10-CM | POA: Diagnosis not present

## 2024-02-04 DIAGNOSIS — Z992 Dependence on renal dialysis: Secondary | ICD-10-CM | POA: Diagnosis not present

## 2024-02-04 DIAGNOSIS — R739 Hyperglycemia, unspecified: Secondary | ICD-10-CM | POA: Diagnosis not present

## 2024-02-04 DIAGNOSIS — N2581 Secondary hyperparathyroidism of renal origin: Secondary | ICD-10-CM | POA: Diagnosis not present

## 2024-02-04 DIAGNOSIS — N186 End stage renal disease: Secondary | ICD-10-CM | POA: Diagnosis not present

## 2024-02-05 ENCOUNTER — Ambulatory Visit: Payer: Self-pay | Attending: Nurse Practitioner | Admitting: Pharmacist

## 2024-02-05 ENCOUNTER — Other Ambulatory Visit: Payer: Self-pay

## 2024-02-05 ENCOUNTER — Encounter: Payer: Self-pay | Admitting: Pharmacist

## 2024-02-05 DIAGNOSIS — E1151 Type 2 diabetes mellitus with diabetic peripheral angiopathy without gangrene: Secondary | ICD-10-CM

## 2024-02-05 MED ORDER — ACCU-CHEK GUIDE TEST VI STRP
ORAL_STRIP | 6 refills | Status: AC
  Filled 2024-02-05: qty 100, 30d supply, fill #0

## 2024-02-05 MED ORDER — ONETOUCH VERIO FLEX SYSTEM W/DEVICE KIT
PACK | 0 refills | Status: AC
  Filled 2024-02-05: qty 1, 30d supply, fill #0

## 2024-02-05 MED ORDER — ONETOUCH DELICA PLUS LANCET33G MISC
6 refills | Status: AC
  Filled 2024-02-05: qty 100, 33d supply, fill #0

## 2024-02-05 NOTE — Progress Notes (Signed)
 S:     No chief complaint on file.  58 y.o. male who presents for diabetes evaluation, education, and management.  PMH is significant for T2DM w/ ESRD (MWF HD), anemia of chronic disease, resistant HTN, OSA, HLD, obesity, epilepsy. Patient was referred and last seen by Zelda on 08/26/2023.   He was last seen by pharmacy on 01/06/2024. Fructosamine collected in the setting of his anemia of ESRD. Result showed that despite POC A1c being 6.0, it is closer to an estimated 7.1%. I did not make any DM medication changes. He is currently managing DM without medication. Tells me that his home BG runs around low 100s-130s in the morning.   Family/Social History:  -Fhx: DM, heart disease, seizure disorder, stroke -Tobacco: never smoker  -Alcohol: none reported   Current diabetes medications include: none Current hypertension medications include: amlodipine  5mg  daily (only taking on non-dialysis days), enalapril  5 mg (only taking on non-dialysis days) Current HLD medications include: atorvastatin  40 mg daily  Insurance coverage: BCBS Medicare, Family Planning Medicaid  Patient denies nocturia (nighttime urination).  Patient reports neuropathy (nerve pain). Patient denies visual changes. Patient reports self foot exams.   Patient reported no changes from previous dietary habits:  -Adherent to sodium restriction. -Has stopped drinking regular Pepsi and Mt. Dew daily. -Limits carbohydrates and tries to avoid sweets.  Patient-reported exercise habits: walking and yard work  Patient-reported home CBGs: low 100s  Patient-reported home BP readings: 126/76 mmHg.   O:   Lab Results  Component Value Date   HGBA1C 6.0 01/06/2024   There were no vitals filed for this visit.   Lipid Panel     Component Value Date/Time   CHOL 159 09/23/2023 0918   TRIG 77 09/23/2023 0918   HDL 46 09/23/2023 0918   CHOLHDL 3.5 09/23/2023 0918   CHOLHDL 3.1 02/09/2016 1045   VLDL 11 02/09/2016 1045    LDLCALC 98 09/23/2023 0918   Clinical Atherosclerotic Cardiovascular Disease (ASCVD): No  The 10-year ASCVD risk score (Arnett DK, et al., 2019) is: 20.4%   Values used to calculate the score:     Age: 27 years     Clincally relevant sex: Male     Is Non-Hispanic African American: Yes     Diabetic: Yes     Tobacco smoker: No     Systolic Blood Pressure: 127 mmHg     Is BP treated: Yes     HDL Cholesterol: 46 mg/dL     Total Cholesterol: 159 mg/dL   A/P: Diabetes longstanding currently right above goal. Home sugars are at goal, however, his fructosamine last month revealed an A1c result closer to 7.1. Patient is able to verbalize appropriate hypoglycemia management plan but is not having hypoglycemia at this time. He has not taken insulin  in at least the last several months. With his ESRD/HD hx, we will get a fructosamine as A1c can be falsely low in this setting. His goal is to obtain candidacy for renal transplant. WE discussed today that glycemic control is paramount. He is not currently taking anything for DM but if his fructosamine continues to trend up he knows we will need to restart a low dose insulin . -Continue management with lifestyle alone.  -Obtain fructosamine today. -Extensively discussed pathophysiology of diabetes, recommended lifestyle interventions, dietary effects on blood sugar control.  -Counseled on s/sx of and management of hypoglycemia.  -Next A1c anticipated 03/2024.  Written patient instructions provided. Patient verbalized understanding of treatment plan.  Total time in face  to face counseling 20 minutes.    Follow-up:  Pharmacist: 2 months PCP: not scheduled -- encouraged to schedule an appointment in 1 month  Herlene Fleeta Morris, PharmD, [place], CPP Clinical Pharmacist [facility] [phone]

## 2024-02-06 ENCOUNTER — Telehealth: Payer: Self-pay | Admitting: Pharmacist

## 2024-02-06 DIAGNOSIS — N186 End stage renal disease: Secondary | ICD-10-CM | POA: Diagnosis not present

## 2024-02-06 DIAGNOSIS — N2581 Secondary hyperparathyroidism of renal origin: Secondary | ICD-10-CM | POA: Diagnosis not present

## 2024-02-06 DIAGNOSIS — R739 Hyperglycemia, unspecified: Secondary | ICD-10-CM | POA: Diagnosis not present

## 2024-02-06 DIAGNOSIS — E1129 Type 2 diabetes mellitus with other diabetic kidney complication: Secondary | ICD-10-CM | POA: Diagnosis not present

## 2024-02-06 DIAGNOSIS — Z992 Dependence on renal dialysis: Secondary | ICD-10-CM | POA: Diagnosis not present

## 2024-02-06 DIAGNOSIS — D509 Iron deficiency anemia, unspecified: Secondary | ICD-10-CM | POA: Diagnosis not present

## 2024-02-06 LAB — FRUCTOSAMINE: Fructosamine: 336 umol/L — ABNORMAL HIGH (ref 0–285)

## 2024-02-06 NOTE — Telephone Encounter (Signed)
 Good morning, friends. Saw [name] 02/04/2024. I saw him last month and fructosamine showed increase with a corresponding A1c of ~7.1. I saw him yesterday to recheck and his fructosamine level continues to uptrend. Unfortunately, is is now 336 umol/L which corresponds to an A1c of 7.3%.   This is very close to goal, but he has ESRD and desires to establish with the transplant team at [facility]. Strict glycemic control is paramount for recipient candidacy. So even with an A1c slightly above goal, I would recommend a very low dose basal insulin  once daily to improve control.   It looks like Lantus  is preferred by his insurance. I have pended orders for signature to this encounter for Zelda's approval as long as she is amenable to the plan. Let me know if I need to call him or if you're able to get in touch with him. I'll assist wherever/however needed.

## 2024-02-07 MED ORDER — PEN NEEDLES 32G X 4 MM MISC
2 refills | Status: AC
  Filled 2024-02-07: qty 100, 100d supply, fill #0

## 2024-02-07 MED ORDER — LANTUS SOLOSTAR 100 UNIT/ML ~~LOC~~ SOPN
10.0000 [IU] | PEN_INJECTOR | Freq: Every day | SUBCUTANEOUS | 2 refills | Status: DC
Start: 2024-02-07 — End: 2024-04-14
  Filled 2024-02-07 – 2024-02-09 (×2): qty 3, 30d supply, fill #0

## 2024-02-09 ENCOUNTER — Other Ambulatory Visit: Payer: Self-pay

## 2024-02-09 NOTE — Telephone Encounter (Signed)
 Patient identified by name and date of birth.  I was able to contact patient and he is aware of medication being sent to pharmacy and why.

## 2024-02-10 DIAGNOSIS — N2581 Secondary hyperparathyroidism of renal origin: Secondary | ICD-10-CM | POA: Diagnosis not present

## 2024-02-10 DIAGNOSIS — R739 Hyperglycemia, unspecified: Secondary | ICD-10-CM | POA: Diagnosis not present

## 2024-02-10 DIAGNOSIS — D509 Iron deficiency anemia, unspecified: Secondary | ICD-10-CM | POA: Diagnosis not present

## 2024-02-10 DIAGNOSIS — E1129 Type 2 diabetes mellitus with other diabetic kidney complication: Secondary | ICD-10-CM | POA: Diagnosis not present

## 2024-02-10 DIAGNOSIS — Z992 Dependence on renal dialysis: Secondary | ICD-10-CM | POA: Diagnosis not present

## 2024-02-10 DIAGNOSIS — N186 End stage renal disease: Secondary | ICD-10-CM | POA: Diagnosis not present

## 2024-02-11 DIAGNOSIS — N186 End stage renal disease: Secondary | ICD-10-CM | POA: Diagnosis not present

## 2024-02-11 DIAGNOSIS — R739 Hyperglycemia, unspecified: Secondary | ICD-10-CM | POA: Diagnosis not present

## 2024-02-11 DIAGNOSIS — Z992 Dependence on renal dialysis: Secondary | ICD-10-CM | POA: Diagnosis not present

## 2024-02-11 DIAGNOSIS — N2581 Secondary hyperparathyroidism of renal origin: Secondary | ICD-10-CM | POA: Diagnosis not present

## 2024-02-11 DIAGNOSIS — E1129 Type 2 diabetes mellitus with other diabetic kidney complication: Secondary | ICD-10-CM | POA: Diagnosis not present

## 2024-02-11 DIAGNOSIS — D509 Iron deficiency anemia, unspecified: Secondary | ICD-10-CM | POA: Diagnosis not present

## 2024-02-16 ENCOUNTER — Other Ambulatory Visit: Payer: Self-pay

## 2024-02-16 DIAGNOSIS — D509 Iron deficiency anemia, unspecified: Secondary | ICD-10-CM | POA: Diagnosis not present

## 2024-02-16 DIAGNOSIS — E1129 Type 2 diabetes mellitus with other diabetic kidney complication: Secondary | ICD-10-CM | POA: Diagnosis not present

## 2024-02-16 DIAGNOSIS — N2581 Secondary hyperparathyroidism of renal origin: Secondary | ICD-10-CM | POA: Diagnosis not present

## 2024-02-16 DIAGNOSIS — N186 End stage renal disease: Secondary | ICD-10-CM | POA: Diagnosis not present

## 2024-02-16 DIAGNOSIS — Z992 Dependence on renal dialysis: Secondary | ICD-10-CM | POA: Diagnosis not present

## 2024-02-16 DIAGNOSIS — R739 Hyperglycemia, unspecified: Secondary | ICD-10-CM | POA: Diagnosis not present

## 2024-02-17 ENCOUNTER — Other Ambulatory Visit: Payer: Self-pay

## 2024-02-18 DIAGNOSIS — N186 End stage renal disease: Secondary | ICD-10-CM | POA: Diagnosis not present

## 2024-02-18 DIAGNOSIS — R739 Hyperglycemia, unspecified: Secondary | ICD-10-CM | POA: Diagnosis not present

## 2024-02-18 DIAGNOSIS — E1129 Type 2 diabetes mellitus with other diabetic kidney complication: Secondary | ICD-10-CM | POA: Diagnosis not present

## 2024-02-18 DIAGNOSIS — Z992 Dependence on renal dialysis: Secondary | ICD-10-CM | POA: Diagnosis not present

## 2024-02-18 DIAGNOSIS — D509 Iron deficiency anemia, unspecified: Secondary | ICD-10-CM | POA: Diagnosis not present

## 2024-02-18 DIAGNOSIS — N2581 Secondary hyperparathyroidism of renal origin: Secondary | ICD-10-CM | POA: Diagnosis not present

## 2024-02-19 DIAGNOSIS — I129 Hypertensive chronic kidney disease with stage 1 through stage 4 chronic kidney disease, or unspecified chronic kidney disease: Secondary | ICD-10-CM | POA: Diagnosis not present

## 2024-02-19 DIAGNOSIS — N186 End stage renal disease: Secondary | ICD-10-CM | POA: Diagnosis not present

## 2024-02-19 DIAGNOSIS — Z992 Dependence on renal dialysis: Secondary | ICD-10-CM | POA: Diagnosis not present

## 2024-02-20 DIAGNOSIS — E1129 Type 2 diabetes mellitus with other diabetic kidney complication: Secondary | ICD-10-CM | POA: Diagnosis not present

## 2024-02-20 DIAGNOSIS — N2581 Secondary hyperparathyroidism of renal origin: Secondary | ICD-10-CM | POA: Diagnosis not present

## 2024-02-20 DIAGNOSIS — R739 Hyperglycemia, unspecified: Secondary | ICD-10-CM | POA: Diagnosis not present

## 2024-02-20 DIAGNOSIS — D509 Iron deficiency anemia, unspecified: Secondary | ICD-10-CM | POA: Diagnosis not present

## 2024-02-20 DIAGNOSIS — N186 End stage renal disease: Secondary | ICD-10-CM | POA: Diagnosis not present

## 2024-02-20 DIAGNOSIS — Z992 Dependence on renal dialysis: Secondary | ICD-10-CM | POA: Diagnosis not present

## 2024-02-23 DIAGNOSIS — E1129 Type 2 diabetes mellitus with other diabetic kidney complication: Secondary | ICD-10-CM | POA: Diagnosis not present

## 2024-02-23 DIAGNOSIS — Z992 Dependence on renal dialysis: Secondary | ICD-10-CM | POA: Diagnosis not present

## 2024-02-23 DIAGNOSIS — N2581 Secondary hyperparathyroidism of renal origin: Secondary | ICD-10-CM | POA: Diagnosis not present

## 2024-02-23 DIAGNOSIS — D509 Iron deficiency anemia, unspecified: Secondary | ICD-10-CM | POA: Diagnosis not present

## 2024-02-23 DIAGNOSIS — N186 End stage renal disease: Secondary | ICD-10-CM | POA: Diagnosis not present

## 2024-02-23 DIAGNOSIS — R739 Hyperglycemia, unspecified: Secondary | ICD-10-CM | POA: Diagnosis not present

## 2024-02-25 DIAGNOSIS — R739 Hyperglycemia, unspecified: Secondary | ICD-10-CM | POA: Diagnosis not present

## 2024-02-25 DIAGNOSIS — E1129 Type 2 diabetes mellitus with other diabetic kidney complication: Secondary | ICD-10-CM | POA: Diagnosis not present

## 2024-02-25 DIAGNOSIS — N2581 Secondary hyperparathyroidism of renal origin: Secondary | ICD-10-CM | POA: Diagnosis not present

## 2024-02-25 DIAGNOSIS — Z992 Dependence on renal dialysis: Secondary | ICD-10-CM | POA: Diagnosis not present

## 2024-02-25 DIAGNOSIS — N186 End stage renal disease: Secondary | ICD-10-CM | POA: Diagnosis not present

## 2024-02-25 DIAGNOSIS — D509 Iron deficiency anemia, unspecified: Secondary | ICD-10-CM | POA: Diagnosis not present

## 2024-02-27 DIAGNOSIS — N2581 Secondary hyperparathyroidism of renal origin: Secondary | ICD-10-CM | POA: Diagnosis not present

## 2024-02-27 DIAGNOSIS — E1129 Type 2 diabetes mellitus with other diabetic kidney complication: Secondary | ICD-10-CM | POA: Diagnosis not present

## 2024-02-27 DIAGNOSIS — N186 End stage renal disease: Secondary | ICD-10-CM | POA: Diagnosis not present

## 2024-02-27 DIAGNOSIS — R739 Hyperglycemia, unspecified: Secondary | ICD-10-CM | POA: Diagnosis not present

## 2024-02-27 DIAGNOSIS — Z992 Dependence on renal dialysis: Secondary | ICD-10-CM | POA: Diagnosis not present

## 2024-02-27 DIAGNOSIS — D509 Iron deficiency anemia, unspecified: Secondary | ICD-10-CM | POA: Diagnosis not present

## 2024-03-02 DIAGNOSIS — N2581 Secondary hyperparathyroidism of renal origin: Secondary | ICD-10-CM | POA: Diagnosis not present

## 2024-03-02 DIAGNOSIS — E1129 Type 2 diabetes mellitus with other diabetic kidney complication: Secondary | ICD-10-CM | POA: Diagnosis not present

## 2024-03-02 DIAGNOSIS — D509 Iron deficiency anemia, unspecified: Secondary | ICD-10-CM | POA: Diagnosis not present

## 2024-03-02 DIAGNOSIS — N186 End stage renal disease: Secondary | ICD-10-CM | POA: Diagnosis not present

## 2024-03-02 DIAGNOSIS — R739 Hyperglycemia, unspecified: Secondary | ICD-10-CM | POA: Diagnosis not present

## 2024-03-02 DIAGNOSIS — Z992 Dependence on renal dialysis: Secondary | ICD-10-CM | POA: Diagnosis not present

## 2024-03-03 ENCOUNTER — Encounter: Payer: Self-pay | Admitting: Adult Health

## 2024-03-03 ENCOUNTER — Ambulatory Visit: Payer: Medicare Other | Admitting: Adult Health

## 2024-03-03 DIAGNOSIS — D509 Iron deficiency anemia, unspecified: Secondary | ICD-10-CM | POA: Diagnosis not present

## 2024-03-03 DIAGNOSIS — N2581 Secondary hyperparathyroidism of renal origin: Secondary | ICD-10-CM | POA: Diagnosis not present

## 2024-03-03 DIAGNOSIS — Z992 Dependence on renal dialysis: Secondary | ICD-10-CM | POA: Diagnosis not present

## 2024-03-03 DIAGNOSIS — R739 Hyperglycemia, unspecified: Secondary | ICD-10-CM | POA: Diagnosis not present

## 2024-03-03 DIAGNOSIS — N186 End stage renal disease: Secondary | ICD-10-CM | POA: Diagnosis not present

## 2024-03-03 DIAGNOSIS — E1129 Type 2 diabetes mellitus with other diabetic kidney complication: Secondary | ICD-10-CM | POA: Diagnosis not present

## 2024-03-04 ENCOUNTER — Other Ambulatory Visit: Payer: Self-pay | Admitting: Nurse Practitioner

## 2024-03-04 ENCOUNTER — Other Ambulatory Visit: Payer: Self-pay

## 2024-03-04 DIAGNOSIS — D688 Other specified coagulation defects: Secondary | ICD-10-CM

## 2024-03-04 MED ORDER — ASPIRIN 81 MG PO TBEC
81.0000 mg | DELAYED_RELEASE_TABLET | Freq: Every day | ORAL | 0 refills | Status: AC
  Filled 2024-03-04: qty 90, 90d supply, fill #0

## 2024-03-05 ENCOUNTER — Other Ambulatory Visit: Payer: Self-pay

## 2024-03-05 DIAGNOSIS — N2581 Secondary hyperparathyroidism of renal origin: Secondary | ICD-10-CM | POA: Diagnosis not present

## 2024-03-05 DIAGNOSIS — N186 End stage renal disease: Secondary | ICD-10-CM | POA: Diagnosis not present

## 2024-03-05 DIAGNOSIS — D509 Iron deficiency anemia, unspecified: Secondary | ICD-10-CM | POA: Diagnosis not present

## 2024-03-05 DIAGNOSIS — R739 Hyperglycemia, unspecified: Secondary | ICD-10-CM | POA: Diagnosis not present

## 2024-03-05 DIAGNOSIS — E1129 Type 2 diabetes mellitus with other diabetic kidney complication: Secondary | ICD-10-CM | POA: Diagnosis not present

## 2024-03-05 DIAGNOSIS — Z992 Dependence on renal dialysis: Secondary | ICD-10-CM | POA: Diagnosis not present

## 2024-03-08 DIAGNOSIS — N186 End stage renal disease: Secondary | ICD-10-CM | POA: Diagnosis not present

## 2024-03-08 DIAGNOSIS — N2581 Secondary hyperparathyroidism of renal origin: Secondary | ICD-10-CM | POA: Diagnosis not present

## 2024-03-08 DIAGNOSIS — Z992 Dependence on renal dialysis: Secondary | ICD-10-CM | POA: Diagnosis not present

## 2024-03-08 DIAGNOSIS — E1129 Type 2 diabetes mellitus with other diabetic kidney complication: Secondary | ICD-10-CM | POA: Diagnosis not present

## 2024-03-08 DIAGNOSIS — R739 Hyperglycemia, unspecified: Secondary | ICD-10-CM | POA: Diagnosis not present

## 2024-03-08 DIAGNOSIS — D509 Iron deficiency anemia, unspecified: Secondary | ICD-10-CM | POA: Diagnosis not present

## 2024-03-10 DIAGNOSIS — E1129 Type 2 diabetes mellitus with other diabetic kidney complication: Secondary | ICD-10-CM | POA: Diagnosis not present

## 2024-03-10 DIAGNOSIS — Z992 Dependence on renal dialysis: Secondary | ICD-10-CM | POA: Diagnosis not present

## 2024-03-10 DIAGNOSIS — R739 Hyperglycemia, unspecified: Secondary | ICD-10-CM | POA: Diagnosis not present

## 2024-03-10 DIAGNOSIS — D509 Iron deficiency anemia, unspecified: Secondary | ICD-10-CM | POA: Diagnosis not present

## 2024-03-10 DIAGNOSIS — N186 End stage renal disease: Secondary | ICD-10-CM | POA: Diagnosis not present

## 2024-03-10 DIAGNOSIS — N2581 Secondary hyperparathyroidism of renal origin: Secondary | ICD-10-CM | POA: Diagnosis not present

## 2024-03-12 DIAGNOSIS — E1129 Type 2 diabetes mellitus with other diabetic kidney complication: Secondary | ICD-10-CM | POA: Diagnosis not present

## 2024-03-12 DIAGNOSIS — Z992 Dependence on renal dialysis: Secondary | ICD-10-CM | POA: Diagnosis not present

## 2024-03-12 DIAGNOSIS — D509 Iron deficiency anemia, unspecified: Secondary | ICD-10-CM | POA: Diagnosis not present

## 2024-03-12 DIAGNOSIS — N186 End stage renal disease: Secondary | ICD-10-CM | POA: Diagnosis not present

## 2024-03-12 DIAGNOSIS — R739 Hyperglycemia, unspecified: Secondary | ICD-10-CM | POA: Diagnosis not present

## 2024-03-12 DIAGNOSIS — N2581 Secondary hyperparathyroidism of renal origin: Secondary | ICD-10-CM | POA: Diagnosis not present

## 2024-03-15 DIAGNOSIS — N186 End stage renal disease: Secondary | ICD-10-CM | POA: Diagnosis not present

## 2024-03-15 DIAGNOSIS — D509 Iron deficiency anemia, unspecified: Secondary | ICD-10-CM | POA: Diagnosis not present

## 2024-03-15 DIAGNOSIS — E1129 Type 2 diabetes mellitus with other diabetic kidney complication: Secondary | ICD-10-CM | POA: Diagnosis not present

## 2024-03-15 DIAGNOSIS — Z992 Dependence on renal dialysis: Secondary | ICD-10-CM | POA: Diagnosis not present

## 2024-03-15 DIAGNOSIS — R739 Hyperglycemia, unspecified: Secondary | ICD-10-CM | POA: Diagnosis not present

## 2024-03-15 DIAGNOSIS — N2581 Secondary hyperparathyroidism of renal origin: Secondary | ICD-10-CM | POA: Diagnosis not present

## 2024-03-16 ENCOUNTER — Ambulatory Visit (INDEPENDENT_AMBULATORY_CARE_PROVIDER_SITE_OTHER): Admitting: Podiatry

## 2024-03-16 ENCOUNTER — Encounter: Payer: Self-pay | Admitting: Podiatry

## 2024-03-16 DIAGNOSIS — M79674 Pain in right toe(s): Secondary | ICD-10-CM

## 2024-03-16 DIAGNOSIS — M79675 Pain in left toe(s): Secondary | ICD-10-CM | POA: Diagnosis not present

## 2024-03-16 DIAGNOSIS — B351 Tinea unguium: Secondary | ICD-10-CM | POA: Diagnosis not present

## 2024-03-16 NOTE — Progress Notes (Signed)
    Subjective:  Patient ID: Jeremy Vannie Raddle., male    DOB: Apr 18, 1966,  MRN: [id number]  Jeremy Vannie Raddle. presents to clinic today for:  Chief Complaint  Patient presents with   Diabetes    [facility] nail trim. A1c 7.1  No foot pain reported. No calluses No blood thinners   Diabetic patient with end-stage renal disease (on dialysis) presents noting his nails are thick, discolored, elongated and painful in shoegear when trying to ambulate.    PCP is Theotis Haze ORN, NP.  Past Medical History:  Diagnosis Date   Abscess    AKI (acute kidney injury) (HCC) 08/05/2017   Bell's palsy    right eye abn since dx bell's palsy   CKD (chronic kidney disease)    Dr. McDiarmind    COVID 2020   mild   Dyspnea    occasional with exertion   ESRD (end stage renal disease) (HCC)    MWF -[facility]   Headache    migraines   High cholesterol    Hypertension    Left shoulder pain 10/26/2013   S/p injection on 10/26/13    Renal insufficiency 02/14/2014   Seizures (HCC) 08/04/2017   related to high blood sugars (08/05/2017)   Type II diabetes mellitus (HCC)     Allergies  Allergen Reactions   Amlodipine  Other (See Comments)    Dizziness    Review of Systems: Negative except as noted in the HPI.  Objective:  Jeremy Dunlap. is a pleasant 58 y.o. male in NAD. AAO x 3.  Vascular Examination: Capillary refill time is 3-5 seconds to toes bilateral. Palpable pedal pulses b/l LE. Digital hair present b/l.  Skin temperature gradient WNL b/l. No varicosities b/l. No cyanosis noted b/l.   Dermatological Examination: Pedal skin with normal turgor, texture and tone b/l. No open wounds. No interdigital macerations b/l. Toenails x10 are 3mm thick, discolored, dystrophic with subungual debris. There is pain with compression of the nail plates.  They are elongated x10     Latest Ref Rng & Units 01/06/2024    8:44 AM 09/23/2023    8:52 AM 06/24/2023   10:43 AM  Hemoglobin A1C  Hemoglobin-A1c 0.0 -  7.0 % 6.0  5.7  5.6    Assessment/Plan: 1. Pain due to onychomycosis of toenails of both feet     The mycotic toenails were sharply debrided x10 with sterile nail nippers and a power debriding burr to decrease bulk/thickness and length.    Return in about 3 months (around 06/16/2024) for [facility].   Jeremy [name], DPM, FACFAS [facility]     [address] [place], [place] [address]                Office [phone]  Fax [phone]

## 2024-03-17 DIAGNOSIS — N2581 Secondary hyperparathyroidism of renal origin: Secondary | ICD-10-CM | POA: Diagnosis not present

## 2024-03-17 DIAGNOSIS — N186 End stage renal disease: Secondary | ICD-10-CM | POA: Diagnosis not present

## 2024-03-17 DIAGNOSIS — D509 Iron deficiency anemia, unspecified: Secondary | ICD-10-CM | POA: Diagnosis not present

## 2024-03-17 DIAGNOSIS — E1129 Type 2 diabetes mellitus with other diabetic kidney complication: Secondary | ICD-10-CM | POA: Diagnosis not present

## 2024-03-17 DIAGNOSIS — Z992 Dependence on renal dialysis: Secondary | ICD-10-CM | POA: Diagnosis not present

## 2024-03-17 DIAGNOSIS — R739 Hyperglycemia, unspecified: Secondary | ICD-10-CM | POA: Diagnosis not present

## 2024-03-19 DIAGNOSIS — N186 End stage renal disease: Secondary | ICD-10-CM | POA: Diagnosis not present

## 2024-03-19 DIAGNOSIS — Z992 Dependence on renal dialysis: Secondary | ICD-10-CM | POA: Diagnosis not present

## 2024-03-19 DIAGNOSIS — D509 Iron deficiency anemia, unspecified: Secondary | ICD-10-CM | POA: Diagnosis not present

## 2024-03-19 DIAGNOSIS — R739 Hyperglycemia, unspecified: Secondary | ICD-10-CM | POA: Diagnosis not present

## 2024-03-19 DIAGNOSIS — E1129 Type 2 diabetes mellitus with other diabetic kidney complication: Secondary | ICD-10-CM | POA: Diagnosis not present

## 2024-03-19 DIAGNOSIS — N2581 Secondary hyperparathyroidism of renal origin: Secondary | ICD-10-CM | POA: Diagnosis not present

## 2024-03-21 DIAGNOSIS — N186 End stage renal disease: Secondary | ICD-10-CM | POA: Diagnosis not present

## 2024-03-21 DIAGNOSIS — Z992 Dependence on renal dialysis: Secondary | ICD-10-CM | POA: Diagnosis not present

## 2024-03-21 DIAGNOSIS — I129 Hypertensive chronic kidney disease with stage 1 through stage 4 chronic kidney disease, or unspecified chronic kidney disease: Secondary | ICD-10-CM | POA: Diagnosis not present

## 2024-03-24 DIAGNOSIS — Z992 Dependence on renal dialysis: Secondary | ICD-10-CM | POA: Diagnosis not present

## 2024-03-24 DIAGNOSIS — N2581 Secondary hyperparathyroidism of renal origin: Secondary | ICD-10-CM | POA: Diagnosis not present

## 2024-03-24 DIAGNOSIS — E1129 Type 2 diabetes mellitus with other diabetic kidney complication: Secondary | ICD-10-CM | POA: Diagnosis not present

## 2024-03-24 DIAGNOSIS — N186 End stage renal disease: Secondary | ICD-10-CM | POA: Diagnosis not present

## 2024-03-24 DIAGNOSIS — D509 Iron deficiency anemia, unspecified: Secondary | ICD-10-CM | POA: Diagnosis not present

## 2024-03-26 DIAGNOSIS — Z992 Dependence on renal dialysis: Secondary | ICD-10-CM | POA: Diagnosis not present

## 2024-03-26 DIAGNOSIS — N2581 Secondary hyperparathyroidism of renal origin: Secondary | ICD-10-CM | POA: Diagnosis not present

## 2024-03-26 DIAGNOSIS — D509 Iron deficiency anemia, unspecified: Secondary | ICD-10-CM | POA: Diagnosis not present

## 2024-03-26 DIAGNOSIS — N186 End stage renal disease: Secondary | ICD-10-CM | POA: Diagnosis not present

## 2024-03-26 DIAGNOSIS — E1129 Type 2 diabetes mellitus with other diabetic kidney complication: Secondary | ICD-10-CM | POA: Diagnosis not present

## 2024-03-29 DIAGNOSIS — D509 Iron deficiency anemia, unspecified: Secondary | ICD-10-CM | POA: Diagnosis not present

## 2024-03-29 DIAGNOSIS — N186 End stage renal disease: Secondary | ICD-10-CM | POA: Diagnosis not present

## 2024-03-29 DIAGNOSIS — Z992 Dependence on renal dialysis: Secondary | ICD-10-CM | POA: Diagnosis not present

## 2024-03-29 DIAGNOSIS — E1129 Type 2 diabetes mellitus with other diabetic kidney complication: Secondary | ICD-10-CM | POA: Diagnosis not present

## 2024-03-29 DIAGNOSIS — N2581 Secondary hyperparathyroidism of renal origin: Secondary | ICD-10-CM | POA: Diagnosis not present

## 2024-03-31 ENCOUNTER — Ambulatory Visit: Payer: Self-pay

## 2024-03-31 ENCOUNTER — Telehealth (INDEPENDENT_AMBULATORY_CARE_PROVIDER_SITE_OTHER): Payer: Self-pay | Admitting: Nurse Practitioner

## 2024-03-31 NOTE — Telephone Encounter (Signed)
 Called pt to confirm appt. Pt did not answer and LVM

## 2024-03-31 NOTE — Telephone Encounter (Signed)
 FYI Only or Action Required?: Action required by provider: request for appointment.  Patient was last seen in primary care on 09/16/2023 by Theotis Haze ORN, NP.  Called Nurse Triage reporting Leg Pain.  Symptoms began several years ago.  Interventions attempted: Ice/heat application.  Symptoms are: gradually worsening.  Triage Disposition: See PCP When Office is Open (Within 3 Days)  Patient/caregiver understands and will follow disposition?: YesCopied from CRM #[id number]. Topic: Clinical - Red Word Triage >> Mar 31, 2024  7:50 AM Carlatta H wrote: [facility] that prompted transfer to Nurse Triage: Patient is having severe pain in leg and back//Sharpe pain in the back of knee and he can't walk// Reason for Disposition  [1] MODERATE pain (e.g., interferes with normal activities, limping) AND [2] present > 3 days  Answer Assessment - Initial Assessment Questions Lying and sitting pt has no pain ,but walking is intense pain. I'm having to walk hunched over. I'm not going to any ER. This needs to be handled.I'm in  a wedding next month and need to walk. Pt has tried OTC creams for pain but doesn't help much. Pt very irritated on phone.      1. ONSET: When did the pain start?      Over a year 2. LOCATION: Where is the pain located?      Left leg 3. PAIN: How bad is the pain?    (Scale 1-10; or mild, moderate, severe)     Severe  4. WORK OR EXERCISE: Has there been any recent work or exercise that involved this part of the body?      na 5. CAUSE: What do you think is causing the leg pain?     na 6. OTHER SYMPTOMS: Do you have any other symptoms? (e.g., chest pain, back pain, breathing difficulty, swelling, rash, fever, numbness, weakness)     Back pain; weakness in left leg at times  Protocols used: Leg Pain-A-AH

## 2024-03-31 NOTE — Telephone Encounter (Signed)
 Patient has appoint with a provider 9/11.

## 2024-04-01 ENCOUNTER — Encounter (INDEPENDENT_AMBULATORY_CARE_PROVIDER_SITE_OTHER): Payer: Self-pay | Admitting: Primary Care

## 2024-04-01 ENCOUNTER — Ambulatory Visit (INDEPENDENT_AMBULATORY_CARE_PROVIDER_SITE_OTHER): Payer: Self-pay | Admitting: Primary Care

## 2024-04-01 VITALS — BP 138/78 | HR 66 | Resp 20 | Ht 68.0 in | Wt 258.4 lb

## 2024-04-01 DIAGNOSIS — I129 Hypertensive chronic kidney disease with stage 1 through stage 4 chronic kidney disease, or unspecified chronic kidney disease: Secondary | ICD-10-CM | POA: Diagnosis not present

## 2024-04-01 DIAGNOSIS — E119 Type 2 diabetes mellitus without complications: Secondary | ICD-10-CM

## 2024-04-01 DIAGNOSIS — M545 Low back pain, unspecified: Secondary | ICD-10-CM

## 2024-04-01 DIAGNOSIS — Z794 Long term (current) use of insulin: Secondary | ICD-10-CM

## 2024-04-01 DIAGNOSIS — M5442 Lumbago with sciatica, left side: Secondary | ICD-10-CM | POA: Diagnosis not present

## 2024-04-01 MED ORDER — KETOROLAC TROMETHAMINE 60 MG/2ML IM SOLN: 60.0000 mg | Freq: Once | INTRAMUSCULAR | 0 refills | Status: DC

## 2024-04-01 MED ORDER — KETOROLAC TROMETHAMINE 60 MG/2ML IM SOLN
60.0000 mg | Freq: Once | INTRAMUSCULAR | Status: AC
  Administered 2024-04-01: 60 mg via INTRAMUSCULAR

## 2024-04-01 NOTE — Progress Notes (Signed)
 [facility]  Jeremy [name], is a 58 y.o. male  RDW:[id number]  FMW:[id number]  DOB - 11-12-1965  Chief Complaint  Patient presents with   Leg Pain   Sciatica       Subjective:   Jeremy [name] is a 58 y.o. obese male here today for an acute visit.  Patient is complaining of back pain started at L4-5 radiates down left leg causing increased amount of pain, unstable gait, difficulty finding a position to rest at night, aggravating factors if any type of movement.  Is just trying to keep still.  He does use over-the-counter Biofreeze Bengay, Biofreeze and hot hot and icy.  Needs products OTC provide very little relief.  Leaf Leg Pain  The incident occurred 12 to 24 hours ago. The quality of the pain is described as aching and stabbing. The pain is at a severity of 9/10. The pain is severe. The pain has been Constant since onset. Associated symptoms include an inability to bear weight, a loss of motion, muscle weakness and tingling. The symptoms are aggravated by movement and weight bearing. He has tried ice, NSAIDs, immobilization, acetaminophen  and rest for the symptoms. The treatment provided mild relief.    No problems updated.  Comprehensive ROS Pertinent positive and negative noted in HPI   Allergies  Allergen Reactions   Amlodipine  Other (See Comments)    Dizziness    Past Medical History:  Diagnosis Date   Abscess    AKI (acute kidney injury) (HCC) 08/05/2017   Bell's palsy    right eye abn since dx bell's palsy   CKD (chronic kidney disease)    Dr. McDiarmind    COVID 2020   mild   Dyspnea    occasional with exertion   ESRD (end stage renal disease) (HCC)    MWF -[facility]   Headache    migraines   High cholesterol    Hypertension    Left shoulder pain 10/26/2013   S/p injection on 10/26/13    Renal insufficiency 02/14/2014   Seizures (HCC) 08/04/2017   related to high blood sugars (08/05/2017)   Type II diabetes mellitus (HCC)      Current Outpatient Medications on File Prior to Visit  Medication Sig Dispense Refill   amLODipine  (NORVASC ) 2.5 MG tablet Take 2.5 mg by mouth at bedtime.     aspirin  EC 81 MG tablet Take 1 tablet (81 mg total) by mouth daily. 90 tablet 0   atorvastatin  (LIPITOR) 40 MG tablet Take 1 tablet (40 mg total) by mouth daily. 90 tablet 3   baclofen  (LIORESAL ) 10 MG tablet Take 1 tablet (10 mg total) by mouth at bedtime. 30 tablet 6   Blood Glucose Monitoring Suppl (ONETOUCH VERIO FLEX SYSTEM) w/Device KIT Use as directed to check blood sugar 1 kit 0   Blood Pressure Monitoring (BLOOD PRESSURE DIGITAL SOLN) KIT Use to check blood pressure daily. 1 kit 0   enalapril  (VASOTEC ) 5 MG tablet Take 1 tablet (5 mg total) by mouth daily. 90 tablet 1   glucose blood (ACCU-CHEK GUIDE TEST) test strip Use to check blood sugar 3 times daily. 100 each 6   insulin  glargine (LANTUS  SOLOSTAR) 100 UNIT/ML Solostar Pen Inject 10 Units into the skin daily. 3 mL 2   Insulin  Pen Needle (PEN NEEDLES) 32G X 4 MM MISC Use to inject Lantus  once daily. 100 each 2   Lancet Devices (MICROLET NEXT LANCING DEVICE) MISC 1 Device by Does not apply route 3 (three) times daily.  1 each 11   Lancets (ONETOUCH DELICA PLUS LANCET33G) MISC Use as directed to check blood sugar three times daily 100 each 6   levETIRAcetam  (KEPPRA  XR) 500 MG 24 hr tablet Take 2 tablets (1,000 mg total) by mouth daily. 180 tablet 0   Methoxy PEG-Epoetin  Beta (MIRCERA IJ) Mircera     SENSIPAR  30 MG tablet Take 60 mg by mouth 3 (three) times a week.     Suzetrigine  (JOURNAVX ) 50 MG TABS Take 2 tablets by mouth 30 (thirty) minutes before breakfast for 1 day on empty stomach, THEN 1 tablet 2 (two) times daily for 29 days. 60 tablet 0   VELPHORO 500 MG chewable tablet Chew 500 mg by mouth 3 (three) times daily with meals.     benzonatate  (TESSALON ) 100 MG capsule Take 1 capsule (100 mg total) by mouth 3 (three) times daily as needed for cough. (Patient not  taking: Reported on 04/01/2024) 21 capsule 0   diclofenac  Sodium (VOLTAREN ) 1 % GEL Apply 4 g topically 4 (four) times daily. (Patient not taking: Reported on 04/01/2024) 200 g 1   gabapentin  (NEURONTIN ) 100 MG capsule Take 1 capsule (100 mg total) by mouth 3 (three) times a week. Take on HD days after HD (hemodialysis) and in the evenings. (Patient not taking: Reported on 04/01/2024) 90 capsule 3   pantoprazole  (PROTONIX ) 40 MG tablet Take 1 tablet (40 mg total) by mouth daily before breakfast. (Patient not taking: Reported on 04/01/2024) 90 tablet 1   Vitamin D , Ergocalciferol , (DRISDOL ) 1.25 MG (50000 UNIT) CAPS capsule Take 1 capsule (50,000 Units total) by mouth once a week. (Patient not taking: Reported on 04/01/2024) 12 capsule 0   Current Facility-Administered Medications on File Prior to Visit  Medication Dose Route Frequency Provider Last Rate Last Admin   0.9 %  sodium chloride  infusion  250 mL Intravenous PRN Gretta Lonni PARAS, MD       sodium chloride  flush (NS) 0.9 % injection 3 mL  3 mL Intravenous Q12H Gretta Lonni PARAS, MD       Health Maintenance  Topic Date Due   Hepatitis C Screening  Never done   COVID-19 Vaccine (3 - Moderna risk series) 12/03/2019   Eye exam for diabetics  05/02/2022   Zoster (Shingles) Vaccine (2 of 2) 10/15/2022   Complete foot exam   08/28/2023   Medicare Annual Wellness Visit  01/01/2024   Flu Shot  10/19/2024*   Hemoglobin A1C  07/07/2024   DTaP/Tdap/Td vaccine (2 - Td or Tdap) 03/14/2026   Colon Cancer Screening  10/15/2033   Pneumococcal Vaccine for age over 45  Completed   Hepatitis B Vaccine  Completed   HIV Screening  Completed   HPV Vaccine  Aged Out   Meningitis B Vaccine  Aged Out  *Topic was postponed. The date shown is not the original due date.    Objective:   Vitals:   04/01/24 1032 04/01/24 1109  BP: (!) 151/67 138/78  Pulse: 66   Resp: 20   SpO2: 100%   Weight: 258 lb 6.4 oz (117.2 kg)   Height: 5' 8 (1.727 m)    BP  Readings from Last 3 Encounters:  04/01/24 138/78  01/06/24 127/74  11/25/23 136/73     Physical Exam Vitals reviewed.  Constitutional:      Appearance: He is obese.  HENT:     Head: Normocephalic.     Right Ear: Tympanic membrane and external ear normal.     Left Ear:  Tympanic membrane and external ear normal.     Nose: Nose normal.  Eyes:     Extraocular Movements: Extraocular movements intact.     Pupils: Pupils are equal, round, and reactive to light.  Cardiovascular:     Rate and Rhythm: Normal rate and regular rhythm.  Pulmonary:     Effort: Pulmonary effort is normal.     Breath sounds: Normal breath sounds.  Abdominal:     General: Bowel sounds are normal. There is distension.     Palpations: Abdomen is soft.  Musculoskeletal:     Comments: Decreased secondary to back pain  Skin:    General: Skin is warm and dry.  Neurological:     Mental Status: He is alert and oriented to person, place, and time.  Psychiatric:        Mood and Affect: Mood normal.        Behavior: Behavior normal.        Thought Content: Thought content normal.        Judgment: Judgment normal.     Assessment & Plan  Jeremy [name] was seen today for leg pain and sciatica.  Diagnoses and all orders for this visit:  Acute left-sided low back pain without sciatica -     AMB referral to orthopedics -     Ambulatory referral to Physical Therapy Toradol  60 mg  Hypertensive chronic kidney disease with stage 1 through stage 4 chronic kidney disease, or unspecified chronic kidney disease Blood pressure is generally well-controlled today it was elevated secondary to increased pain in back and leg.  No changes on medication    Patient have been counseled extensively about nutrition and exercise. Other issues discussed during this visit include: low cholesterol diet, weight control and daily exercise, foot care, annual eye examinations at   The patient was given clear instructions to go to ER or return  to medical center if symptoms don't improve, worsen or new problems develop. The patient verbalized understanding. The patient was told to call to get lab results if they haven't heard anything in the next week.   This note has been created with Education officer, environmental. Any transcriptional errors are unintentional.   Rosaline SHAUNNA Bohr, NP 04/01/2024, 11:24 AM

## 2024-04-02 DIAGNOSIS — N186 End stage renal disease: Secondary | ICD-10-CM | POA: Diagnosis not present

## 2024-04-02 DIAGNOSIS — D509 Iron deficiency anemia, unspecified: Secondary | ICD-10-CM | POA: Diagnosis not present

## 2024-04-02 DIAGNOSIS — Z992 Dependence on renal dialysis: Secondary | ICD-10-CM | POA: Diagnosis not present

## 2024-04-02 DIAGNOSIS — E1129 Type 2 diabetes mellitus with other diabetic kidney complication: Secondary | ICD-10-CM | POA: Diagnosis not present

## 2024-04-02 DIAGNOSIS — N2581 Secondary hyperparathyroidism of renal origin: Secondary | ICD-10-CM | POA: Diagnosis not present

## 2024-04-05 DIAGNOSIS — D509 Iron deficiency anemia, unspecified: Secondary | ICD-10-CM | POA: Diagnosis not present

## 2024-04-05 DIAGNOSIS — N2581 Secondary hyperparathyroidism of renal origin: Secondary | ICD-10-CM | POA: Diagnosis not present

## 2024-04-05 DIAGNOSIS — Z992 Dependence on renal dialysis: Secondary | ICD-10-CM | POA: Diagnosis not present

## 2024-04-05 DIAGNOSIS — E1129 Type 2 diabetes mellitus with other diabetic kidney complication: Secondary | ICD-10-CM | POA: Diagnosis not present

## 2024-04-05 DIAGNOSIS — N186 End stage renal disease: Secondary | ICD-10-CM | POA: Diagnosis not present

## 2024-04-07 DIAGNOSIS — E1129 Type 2 diabetes mellitus with other diabetic kidney complication: Secondary | ICD-10-CM | POA: Diagnosis not present

## 2024-04-07 DIAGNOSIS — D509 Iron deficiency anemia, unspecified: Secondary | ICD-10-CM | POA: Diagnosis not present

## 2024-04-07 DIAGNOSIS — N186 End stage renal disease: Secondary | ICD-10-CM | POA: Diagnosis not present

## 2024-04-07 DIAGNOSIS — N2581 Secondary hyperparathyroidism of renal origin: Secondary | ICD-10-CM | POA: Diagnosis not present

## 2024-04-07 DIAGNOSIS — Z992 Dependence on renal dialysis: Secondary | ICD-10-CM | POA: Diagnosis not present

## 2024-04-08 ENCOUNTER — Other Ambulatory Visit: Payer: Self-pay

## 2024-04-08 DIAGNOSIS — D352 Benign neoplasm of pituitary gland: Secondary | ICD-10-CM | POA: Diagnosis not present

## 2024-04-09 ENCOUNTER — Other Ambulatory Visit: Payer: Self-pay

## 2024-04-09 DIAGNOSIS — N186 End stage renal disease: Secondary | ICD-10-CM

## 2024-04-09 DIAGNOSIS — Z992 Dependence on renal dialysis: Secondary | ICD-10-CM | POA: Diagnosis not present

## 2024-04-09 DIAGNOSIS — E1129 Type 2 diabetes mellitus with other diabetic kidney complication: Secondary | ICD-10-CM | POA: Diagnosis not present

## 2024-04-09 DIAGNOSIS — D509 Iron deficiency anemia, unspecified: Secondary | ICD-10-CM | POA: Diagnosis not present

## 2024-04-09 DIAGNOSIS — N2581 Secondary hyperparathyroidism of renal origin: Secondary | ICD-10-CM | POA: Diagnosis not present

## 2024-04-12 ENCOUNTER — Telehealth: Payer: Self-pay | Admitting: Primary Care

## 2024-04-12 DIAGNOSIS — N2581 Secondary hyperparathyroidism of renal origin: Secondary | ICD-10-CM | POA: Diagnosis not present

## 2024-04-12 DIAGNOSIS — N186 End stage renal disease: Secondary | ICD-10-CM | POA: Diagnosis not present

## 2024-04-12 DIAGNOSIS — Z992 Dependence on renal dialysis: Secondary | ICD-10-CM | POA: Diagnosis not present

## 2024-04-12 DIAGNOSIS — D509 Iron deficiency anemia, unspecified: Secondary | ICD-10-CM | POA: Diagnosis not present

## 2024-04-12 DIAGNOSIS — E1129 Type 2 diabetes mellitus with other diabetic kidney complication: Secondary | ICD-10-CM | POA: Diagnosis not present

## 2024-04-12 NOTE — Telephone Encounter (Signed)
 Confirmed appt for 9/23

## 2024-04-13 ENCOUNTER — Encounter: Payer: Self-pay | Admitting: Pharmacist

## 2024-04-13 ENCOUNTER — Ambulatory Visit: Attending: Family Medicine | Admitting: Pharmacist

## 2024-04-13 DIAGNOSIS — E1151 Type 2 diabetes mellitus with diabetic peripheral angiopathy without gangrene: Secondary | ICD-10-CM | POA: Diagnosis not present

## 2024-04-13 DIAGNOSIS — E119 Type 2 diabetes mellitus without complications: Secondary | ICD-10-CM

## 2024-04-13 LAB — POCT GLYCOSYLATED HEMOGLOBIN (HGB A1C): HbA1c, POC (controlled diabetic range): 6.4 % (ref 0.0–7.0)

## 2024-04-13 NOTE — Progress Notes (Signed)
 S:     No chief complaint on file.  58 y.o. male who presents for diabetes evaluation, education, and management.   PMH is significant for T2DM w/ ESRD (MWF HD), anemia of chronic disease, resistant HTN, OSA, HLD, obesity, epilepsy. Patient was referred and last seen by Zelda on 08/26/2023.   It looks like he has established care with Rosaline at [facility]. Last saw her on 04/01/2024.   He was last seen by pharmacy on 02/05/2024. Fructosamine collected in the setting of his anemia of ESRD. Result showed that despite POC A1c being 6.0 in June of this year, it is closer to an estimated 7.3%. I recommended starting low-dose basal insulin . At the time of that visit, he was currently managing DM without medication.   Today, pt admits that his home BG runs around low 130s-140s in the morning. He did not start insulin . Understandably, he is worried about hypoglycemia and hypotension given his dialysis. When he took insulin  previously, he was prone to hypoglycemic episodes. However, when he did take insulin  previously he was on relatively high doses for one. Also, he was on both basal and bolus insulin .   Family/Social History:  -Fhx: DM, heart disease, seizure disorder, stroke -Tobacco: never smoker  -Alcohol: none reported   Current diabetes medications include: none Current hypertension medications include: amlodipine  5mg  daily (only taking on non-dialysis days), enalapril  5 mg (only taking on non-dialysis days) Current HLD medications include: atorvastatin  40 mg daily  Insurance coverage: BCBS Medicare, [employer] Medicaid  Patient denies nocturia (nighttime urination).  Patient reports neuropathy (nerve pain). Patient denies visual changes. Patient reports self foot exams.   Patient reported no changes from previous dietary habits:  -Adherent to sodium restriction. -Has stopped drinking regular Pepsi and Mt. Dew daily. -Limits carbohydrates and tries to avoid sweets.  Patient-reported  exercise habits: walking and yard work  Patient-reported home CBGs: 130s-140s  O:   Lab Results  Component Value Date   HGBA1C 6.4 04/13/2024   There were no vitals filed for this visit.   Lipid Panel     Component Value Date/Time   CHOL 159 09/23/2023 0918   TRIG 77 09/23/2023 0918   HDL 46 09/23/2023 0918   CHOLHDL 3.5 09/23/2023 0918   CHOLHDL 3.1 02/09/2016 1045   VLDL 11 02/09/2016 1045   LDLCALC 98 09/23/2023 0918   Clinical Atherosclerotic Cardiovascular Disease (ASCVD): No  The 10-year ASCVD risk score (Arnett DK, et al., 2019) is: 23.5%   Values used to calculate the score:     Age: 78 years     Clincally relevant sex: Male     Is Non-Hispanic African American: Yes     Diabetic: Yes     Tobacco smoker: No     Systolic Blood Pressure: 138 mmHg     Is BP treated: Yes     HDL Cholesterol: 46 mg/dL     Total Cholesterol: 159 mg/dL   A/P: Diabetes longstanding currently right above goal given his last fructoasmine. Will check again today. POCT A1c today is 6.4%. Home sugars are above goal. Patient is able to verbalize appropriate hypoglycemia management plan but is not having hypoglycemia at this time. He has not taken insulin  in at least the last several months. I think we should start a low dose basal insulin  on days when his fasting sugars are above 130 mg/dL. I discussed this with him. He would like to wait until he sees the results of his fructoasmine level. I plan  to call him tomorrow morning to discuss this once I have the result. His goal is to obtain candidacy for renal transplant. We discussed today that glycemic control is paramount. He is not currently taking anything for DM but if his fructosamine continues to trend up he knows we will need to restart a low dose insulin . -Continue management with lifestyle alone.  -Obtain fructosamine today. -Extensively discussed pathophysiology of diabetes, recommended lifestyle interventions, dietary effects on blood sugar  control.  -Counseled on s/sx of and management of hypoglycemia.  -Next A1c anticipated 06/2024.  Written patient instructions provided. Patient verbalized understanding of treatment plan.  Total time in face to face counseling 20 minutes.    Follow-up:  Pharmacist: 2 months  Herlene Fleeta Morris, PharmD, [place], CPP Clinical Pharmacist [facility] [phone]

## 2024-04-14 ENCOUNTER — Other Ambulatory Visit: Payer: Self-pay

## 2024-04-14 ENCOUNTER — Telehealth: Payer: Self-pay | Admitting: Pharmacist

## 2024-04-14 DIAGNOSIS — N186 End stage renal disease: Secondary | ICD-10-CM | POA: Diagnosis not present

## 2024-04-14 DIAGNOSIS — N2581 Secondary hyperparathyroidism of renal origin: Secondary | ICD-10-CM | POA: Diagnosis not present

## 2024-04-14 DIAGNOSIS — Z992 Dependence on renal dialysis: Secondary | ICD-10-CM | POA: Diagnosis not present

## 2024-04-14 DIAGNOSIS — E1169 Type 2 diabetes mellitus with other specified complication: Secondary | ICD-10-CM

## 2024-04-14 DIAGNOSIS — E1151 Type 2 diabetes mellitus with diabetic peripheral angiopathy without gangrene: Secondary | ICD-10-CM

## 2024-04-14 DIAGNOSIS — D509 Iron deficiency anemia, unspecified: Secondary | ICD-10-CM | POA: Diagnosis not present

## 2024-04-14 DIAGNOSIS — E1129 Type 2 diabetes mellitus with other diabetic kidney complication: Secondary | ICD-10-CM | POA: Diagnosis not present

## 2024-04-14 LAB — LIPID PANEL
Chol/HDL Ratio: 3.6 ratio (ref 0.0–5.0)
Cholesterol, Total: 165 mg/dL (ref 100–199)
HDL: 46 mg/dL (ref >39)
LDL Chol Calc (NIH): 103 mg/dL — ABNORMAL HIGH (ref 0–99)
Triglycerides: 87 mg/dL (ref 0–149)
VLDL Cholesterol Cal: 16 mg/dL (ref 5–40)

## 2024-04-14 LAB — FRUCTOSAMINE: Fructosamine: 353 umol/L — ABNORMAL HIGH (ref 0–285)

## 2024-04-14 MED ORDER — ATORVASTATIN CALCIUM 40 MG PO TABS: 40.0000 mg | ORAL_TABLET | Freq: Every day | ORAL | 3 refills | Status: DC

## 2024-04-14 MED ORDER — LANTUS SOLOSTAR 100 UNIT/ML ~~LOC~~ SOPN: 10.0000 [IU] | PEN_INJECTOR | Freq: Every day | SUBCUTANEOUS | 2 refills | Status: DC

## 2024-04-14 MED ORDER — LANTUS SOLOSTAR 100 UNIT/ML ~~LOC~~ SOPN
10.0000 [IU] | PEN_INJECTOR | Freq: Every day | SUBCUTANEOUS | 2 refills | Status: AC
  Filled 2024-04-14: qty 3, 30d supply, fill #0

## 2024-04-14 MED ORDER — ATORVASTATIN CALCIUM 40 MG PO TABS
40.0000 mg | ORAL_TABLET | Freq: Every day | ORAL | 3 refills | Status: AC
Start: 2024-04-14 — End: ?
  Filled 2024-04-14: qty 90, 90d supply, fill #0

## 2024-04-14 NOTE — Telephone Encounter (Signed)
 Fructosamine results received. His fructosamine shows an increase from when we last checked. A1c is 6.4 but is falsely low in the setting of ESRD. His fructosamine shows an A1c closer to an estimated 7.6%.   Call placed to patient to discuss this. He was unavailable, so I left a HIPAA-compliant VM with instructions to return my call.   Explained the above information to the patient in my VM. His DM has been diet controlled for a little while now, but his fructosamine correlates with an A1c that's 7.6%. I explained to him yesterday that with his ESRD and consideration for kidney transplantation in the future, we will need to maintain strict A1c control. At this time, I recommend a low dose basal insulin . Rxn sent for Lantus . He can take 10 units daily in the morning. Advised in my VM for him to skip his dose if fasting sugar is under 130 mg/dL.  Has not filled atorvastatin  since 08/26/2023. LDL goal is < 70 mg/dL and his came back above goal yesterday. Refill sent for atorv. I instructed him to resume taking 20 mg once daily.

## 2024-04-15 ENCOUNTER — Other Ambulatory Visit: Payer: Self-pay

## 2024-04-16 DIAGNOSIS — D509 Iron deficiency anemia, unspecified: Secondary | ICD-10-CM | POA: Diagnosis not present

## 2024-04-16 DIAGNOSIS — N186 End stage renal disease: Secondary | ICD-10-CM | POA: Diagnosis not present

## 2024-04-16 DIAGNOSIS — E1129 Type 2 diabetes mellitus with other diabetic kidney complication: Secondary | ICD-10-CM | POA: Diagnosis not present

## 2024-04-16 DIAGNOSIS — Z992 Dependence on renal dialysis: Secondary | ICD-10-CM | POA: Diagnosis not present

## 2024-04-16 DIAGNOSIS — N2581 Secondary hyperparathyroidism of renal origin: Secondary | ICD-10-CM | POA: Diagnosis not present

## 2024-04-19 DIAGNOSIS — N2581 Secondary hyperparathyroidism of renal origin: Secondary | ICD-10-CM | POA: Diagnosis not present

## 2024-04-19 DIAGNOSIS — Z992 Dependence on renal dialysis: Secondary | ICD-10-CM | POA: Diagnosis not present

## 2024-04-19 DIAGNOSIS — D509 Iron deficiency anemia, unspecified: Secondary | ICD-10-CM | POA: Diagnosis not present

## 2024-04-19 DIAGNOSIS — N186 End stage renal disease: Secondary | ICD-10-CM | POA: Diagnosis not present

## 2024-04-19 DIAGNOSIS — E1129 Type 2 diabetes mellitus with other diabetic kidney complication: Secondary | ICD-10-CM | POA: Diagnosis not present

## 2024-04-20 DIAGNOSIS — N186 End stage renal disease: Secondary | ICD-10-CM | POA: Diagnosis not present

## 2024-04-20 DIAGNOSIS — Z992 Dependence on renal dialysis: Secondary | ICD-10-CM | POA: Diagnosis not present

## 2024-04-20 DIAGNOSIS — I129 Hypertensive chronic kidney disease with stage 1 through stage 4 chronic kidney disease, or unspecified chronic kidney disease: Secondary | ICD-10-CM | POA: Diagnosis not present

## 2024-04-21 ENCOUNTER — Ambulatory Visit: Payer: Self-pay | Admitting: Nurse Practitioner

## 2024-04-21 DIAGNOSIS — R739 Hyperglycemia, unspecified: Secondary | ICD-10-CM | POA: Diagnosis not present

## 2024-04-21 DIAGNOSIS — N2581 Secondary hyperparathyroidism of renal origin: Secondary | ICD-10-CM | POA: Diagnosis not present

## 2024-04-21 DIAGNOSIS — N186 End stage renal disease: Secondary | ICD-10-CM | POA: Diagnosis not present

## 2024-04-21 DIAGNOSIS — Z23 Encounter for immunization: Secondary | ICD-10-CM | POA: Diagnosis not present

## 2024-04-21 DIAGNOSIS — Z992 Dependence on renal dialysis: Secondary | ICD-10-CM | POA: Diagnosis not present

## 2024-04-22 ENCOUNTER — Other Ambulatory Visit: Payer: Self-pay

## 2024-04-24 DIAGNOSIS — Z992 Dependence on renal dialysis: Secondary | ICD-10-CM | POA: Diagnosis not present

## 2024-04-24 DIAGNOSIS — N2581 Secondary hyperparathyroidism of renal origin: Secondary | ICD-10-CM | POA: Diagnosis not present

## 2024-04-24 DIAGNOSIS — Z23 Encounter for immunization: Secondary | ICD-10-CM | POA: Diagnosis not present

## 2024-04-24 DIAGNOSIS — R739 Hyperglycemia, unspecified: Secondary | ICD-10-CM | POA: Diagnosis not present

## 2024-04-24 DIAGNOSIS — N186 End stage renal disease: Secondary | ICD-10-CM | POA: Diagnosis not present

## 2024-04-26 ENCOUNTER — Other Ambulatory Visit: Payer: Self-pay

## 2024-04-26 DIAGNOSIS — Z23 Encounter for immunization: Secondary | ICD-10-CM | POA: Diagnosis not present

## 2024-04-26 DIAGNOSIS — R739 Hyperglycemia, unspecified: Secondary | ICD-10-CM | POA: Diagnosis not present

## 2024-04-26 DIAGNOSIS — N186 End stage renal disease: Secondary | ICD-10-CM | POA: Diagnosis not present

## 2024-04-26 DIAGNOSIS — Z992 Dependence on renal dialysis: Secondary | ICD-10-CM | POA: Diagnosis not present

## 2024-04-26 DIAGNOSIS — N2581 Secondary hyperparathyroidism of renal origin: Secondary | ICD-10-CM | POA: Diagnosis not present

## 2024-04-28 ENCOUNTER — Ambulatory Visit: Payer: Self-pay

## 2024-04-28 DIAGNOSIS — N186 End stage renal disease: Secondary | ICD-10-CM | POA: Diagnosis not present

## 2024-04-28 DIAGNOSIS — Z992 Dependence on renal dialysis: Secondary | ICD-10-CM | POA: Diagnosis not present

## 2024-04-28 DIAGNOSIS — N2581 Secondary hyperparathyroidism of renal origin: Secondary | ICD-10-CM | POA: Diagnosis not present

## 2024-04-28 DIAGNOSIS — Z23 Encounter for immunization: Secondary | ICD-10-CM | POA: Diagnosis not present

## 2024-04-28 DIAGNOSIS — R739 Hyperglycemia, unspecified: Secondary | ICD-10-CM | POA: Diagnosis not present

## 2024-04-28 NOTE — Telephone Encounter (Signed)
 FYI Only or Action Required?: FYI only for provider.  Patient was last seen in primary care on 04/01/2024 by Jeremy Rosaline SQUIBB, NP.  Called Nurse Triage reporting Hand Pain.  Symptoms began several months ago.  Interventions attempted: OTC medications: Icy hot and biofreeze.  Symptoms are: gradually worsening.  Triage Disposition: See Physician Within 24 Hours  Patient/caregiver understands and will follow disposition?: Yes     Copied from CRM [id number]. Topic: Clinical - Red Word Triage >> Apr 28, 2024  1:47 PM [name] wrote: Red Word that prompted transfer to Nurse Triage: Patient states he has a burning sensation in his hands. NT Reason for Disposition  Numbness (i.e., loss of sensation) in hand or fingers  (Exceptions: Just tingling; numbness present > 2 weeks.)  Answer Assessment - Initial Assessment Questions Patient states he has not seen his provider for this symptom. Occurs 3-4 times a week. Rubs icy hot and biofreeze on area for symptoms. Patient states his blood sugar levels have been in a good range and is concerned about symptoms due to being diabetic.   1. ONSET: When did the pain start?     A few months  2. LOCATION: Where is the pain located?     R hand burning sensation in pinky finger  3. PAIN: How bad is the pain? (Scale 1-10; or mild, moderate, severe)     Can vary  4. WORK OR EXERCISE: Has there been any recent work or exercise that involved this part (i.e., hand or wrist) of the body?     Denies  5. CAUSE: What do you think is causing the pain?     Unsure  6. AGGRAVATING FACTORS: What makes the pain worse? (e.g., using computer)     Unsure  7. OTHER SYMPTOMS: Do you have any other symptoms? (e.g., fever, neck pain, numbness or tingling, rash, swelling)     Pins and needles sensation  Protocols used: Hand Pain-A-AH

## 2024-04-28 NOTE — Telephone Encounter (Signed)
 Patient has appointment 04/29/2024

## 2024-04-29 ENCOUNTER — Ambulatory Visit (INDEPENDENT_AMBULATORY_CARE_PROVIDER_SITE_OTHER): Admitting: Primary Care

## 2024-04-29 ENCOUNTER — Ambulatory Visit (INDEPENDENT_AMBULATORY_CARE_PROVIDER_SITE_OTHER): Payer: Self-pay | Admitting: Nurse Practitioner

## 2024-04-29 ENCOUNTER — Encounter: Payer: Self-pay | Admitting: Nurse Practitioner

## 2024-04-29 ENCOUNTER — Other Ambulatory Visit: Payer: Self-pay

## 2024-04-29 DIAGNOSIS — G629 Polyneuropathy, unspecified: Secondary | ICD-10-CM

## 2024-04-29 MED ORDER — GABAPENTIN 300 MG PO CAPS
300.0000 mg | ORAL_CAPSULE | Freq: Two times a day (BID) | ORAL | 0 refills | Status: AC
  Filled 2024-04-29: qty 60, 30d supply, fill #0

## 2024-04-29 MED ORDER — GABAPENTIN 100 MG PO CAPS
100.0000 mg | ORAL_CAPSULE | ORAL | 3 refills | Status: DC
  Filled 2024-04-29: qty 90, 210d supply, fill #0

## 2024-04-29 MED ORDER — GABAPENTIN 100 MG PO CAPS
100.0000 mg | ORAL_CAPSULE | Freq: Three times a day (TID) | ORAL | 3 refills | Status: DC
  Filled 2024-04-29: qty 90, 30d supply, fill #0

## 2024-04-29 NOTE — Progress Notes (Signed)
 Subjective   Patient ID: Jeremy Burkemper., male    DOB: 06/29/1966, 58 y.o.   MRN: [id number]  Chief Complaint  Patient presents with   Hand Pain    B/l Patient states he has a burning sensation  Occurs 3-4 times a week. Rubs icy hot and biofreeze on area for symptoms. Patient states his blood sugar levels have been in a good range and is concerned about symptoms due to being diabetic.     Referring provider: Celestia Rosaline SQUIBB, NP  Jeremy [name]. is a 58 y.o. male with Past Medical History: No date: Abscess 08/05/2017: AKI (acute kidney injury) No date: Bell's palsy     Comment:  right eye abn since dx bell's palsy No date: CKD (chronic kidney disease)     Comment:  Dr. McDiarmind  2020: COVID     Comment:  mild No date: Dyspnea     Comment:  occasional with exertion No date: ESRD (end stage renal disease) (HCC)     Comment:  MWF -[facility] No date: Headache     Comment:  migraines No date: High cholesterol No date: Hypertension 10/26/2013: Left shoulder pain     Comment:  S/p injection on 10/26/13  02/14/2014: Renal insufficiency 08/04/2017: Seizures (HCC)     Comment:  related to high blood sugars (08/05/2017) No date: Type II diabetes mellitus (HCC)   HPI  Patient presents today for an acute visit.  He states that he has been having burning and tingling to his hands bilaterally.  He states that he thinks this is due to neuropathy from diabetes.  He has been prescribed gabapentin  in the past for this.  He would like a refill on gabapentin  today. Denies f/c/s, n/v/d, hemoptysis, PND, leg swelling Denies chest pain or edema    Allergies  Allergen Reactions   Amlodipine  Other (See Comments)    Dizziness    Immunization History  Administered Date(s) Administered   Hepatitis B, ADULT 09/10/2019   Hepatitis B, Dialysis 09/10/2019   Hepb-cpg 01/13/2020, 03/08/2020, 04/07/2020, 06/09/2020   Influenza Split 04/28/2013   Influenza, Mdck, Trivalent,PF 6+  MOS(egg free) 05/16/2023   Influenza, Quadrivalent, Recombinant, Inj, Pf 05/01/2022   Influenza,inj,Quad PF,6+ Mos 03/14/2016, 04/17/2017, 04/02/2018, 05/25/2019, 04/27/2021   Moderna Sars-Covid-2 Vaccination 10/08/2019, 11/05/2019   PNEUMOCOCCAL CONJUGATE-20 08/26/2023   Pneumococcal Polysaccharide-23 03/14/2016, 08/11/2017   Pneumococcal-Unspecified 08/11/2017   Tdap 03/14/2016   Zoster Recombinant(Shingrix ) 08/20/2022    Tobacco History: Social History   Tobacco Use  Smoking Status Never   Passive exposure: Never  Smokeless Tobacco Never   Counseling given: Not Answered   Outpatient Encounter Medications as of 04/29/2024  Medication Sig   amLODipine  (NORVASC ) 2.5 MG tablet Take 2.5 mg by mouth at bedtime.   aspirin  EC 81 MG tablet Take 1 tablet (81 mg total) by mouth daily.   atorvastatin  (LIPITOR) 40 MG tablet Take 1 tablet (40 mg total) by mouth daily.   baclofen  (LIORESAL ) 10 MG tablet Take 1 tablet (10 mg total) by mouth at bedtime.   Blood Glucose Monitoring Suppl (ONETOUCH VERIO FLEX SYSTEM) w/Device KIT Use as directed to check blood sugar   Blood Pressure Monitoring (BLOOD PRESSURE DIGITAL SOLN) KIT Use to check blood pressure daily.   enalapril  (VASOTEC ) 5 MG tablet Take 1 tablet (5 mg total) by mouth daily.   gabapentin  (NEURONTIN ) 300 MG capsule Take 1 capsule (300 mg total) by mouth 2 (two) times daily.   glucose blood (ACCU-CHEK GUIDE TEST) test strip Use  to check blood sugar 3 times daily.   insulin  glargine (LANTUS  SOLOSTAR) 100 UNIT/ML Solostar Pen Inject 10 Units into the skin daily. Do not take the insulin  unless your fasting sugar in the morning is above 130 mg/dL.   Insulin  Pen Needle (PEN NEEDLES) 32G X 4 MM MISC Use to inject Lantus  once daily.   Lancet Devices (MICROLET NEXT LANCING DEVICE) MISC 1 Device by Does not apply route 3 (three) times daily.   Lancets (ONETOUCH DELICA PLUS LANCET33G) MISC Use as directed to check blood sugar three times daily    levETIRAcetam  (KEPPRA  XR) 500 MG 24 hr tablet Take 2 tablets (1,000 mg total) by mouth daily.   SENSIPAR  30 MG tablet Take 60 mg by mouth 3 (three) times a week.   Suzetrigine  (JOURNAVX ) 50 MG TABS Take 2 tablets by mouth 30 (thirty) minutes before breakfast for 1 day on empty stomach, THEN 1 tablet 2 (two) times daily for 29 days.   VELPHORO 500 MG chewable tablet Chew 500 mg by mouth 3 (three) times daily with meals.   benzonatate  (TESSALON ) 100 MG capsule Take 1 capsule (100 mg total) by mouth 3 (three) times daily as needed for cough. (Patient not taking: Reported on 04/29/2024)   diclofenac  Sodium (VOLTAREN ) 1 % GEL Apply 4 g topically 4 (four) times daily. (Patient not taking: Reported on 04/29/2024)   pantoprazole  (PROTONIX ) 40 MG tablet Take 1 tablet (40 mg total) by mouth daily before breakfast. (Patient not taking: Reported on 04/29/2024)   Vitamin D , Ergocalciferol , (DRISDOL ) 1.25 MG (50000 UNIT) CAPS capsule Take 1 capsule (50,000 Units total) by mouth once a week. (Patient not taking: Reported on 04/29/2024)   [DISCONTINUED] gabapentin  (NEURONTIN ) 100 MG capsule Take 1 capsule (100 mg total) by mouth 3 (three) times a week. Take on HD days after HD (hemodialysis) and in the evenings. (Patient not taking: Reported on 04/29/2024)   [DISCONTINUED] gabapentin  (NEURONTIN ) 100 MG capsule Take 1 capsule (100 mg total) by mouth 3 (three) times a week. Take on HD days after HD (hemodialysis) and in the evenings.   [DISCONTINUED] gabapentin  (NEURONTIN ) 100 MG capsule Take 1 capsule (100 mg total) by mouth 3 (three) times daily. Take on HD days after HD (hemodialysis) and in the evenings.   Facility-Administered Encounter Medications as of 04/29/2024  Medication   0.9 %  sodium chloride  infusion   sodium chloride  flush (NS) 0.9 % injection 3 mL    Review of Systems  Review of Systems  Constitutional: Negative.   HENT: Negative.    Cardiovascular: Negative.   Gastrointestinal: Negative.    Musculoskeletal:        Burning to bilateral hands  Allergic/Immunologic: Negative.   Neurological: Negative.   Psychiatric/Behavioral: Negative.       Objective:   BP 129/68   Pulse 78   Resp 16   Wt 255 lb (115.7 kg)   SpO2 97%   BMI 38.77 kg/m   Wt Readings from Last 5 Encounters:  04/29/24 255 lb (115.7 kg)  04/01/24 258 lb 6.4 oz (117.2 kg)  10/16/23 251 lb (113.9 kg)  09/04/23 251 lb (113.9 kg)  08/26/23 254 lb 12.8 oz (115.6 kg)     Physical Exam Vitals and nursing note reviewed.  Constitutional:      General: He is not in acute distress.    Appearance: He is well-developed.  Cardiovascular:     Rate and Rhythm: Normal rate and regular rhythm.  Pulmonary:     Effort: Pulmonary  effort is normal.     Breath sounds: Normal breath sounds.  Skin:    General: Skin is warm and dry.  Neurological:     Mental Status: He is alert and oriented to person, place, and time.       Assessment & Plan:   Polyneuropathy  Other orders -     Gabapentin ; Take 1 capsule (300 mg total) by mouth 2 (two) times daily.  Dispense: 60 capsule; Refill: 0     No follow-ups on file.   Bascom GORMAN Borer, NP 04/29/2024

## 2024-04-30 DIAGNOSIS — Z23 Encounter for immunization: Secondary | ICD-10-CM | POA: Diagnosis not present

## 2024-04-30 DIAGNOSIS — N186 End stage renal disease: Secondary | ICD-10-CM | POA: Diagnosis not present

## 2024-04-30 DIAGNOSIS — R739 Hyperglycemia, unspecified: Secondary | ICD-10-CM | POA: Diagnosis not present

## 2024-04-30 DIAGNOSIS — Z992 Dependence on renal dialysis: Secondary | ICD-10-CM | POA: Diagnosis not present

## 2024-04-30 DIAGNOSIS — N2581 Secondary hyperparathyroidism of renal origin: Secondary | ICD-10-CM | POA: Diagnosis not present

## 2024-05-03 DIAGNOSIS — Z992 Dependence on renal dialysis: Secondary | ICD-10-CM | POA: Diagnosis not present

## 2024-05-03 DIAGNOSIS — N186 End stage renal disease: Secondary | ICD-10-CM | POA: Diagnosis not present

## 2024-05-03 DIAGNOSIS — R739 Hyperglycemia, unspecified: Secondary | ICD-10-CM | POA: Diagnosis not present

## 2024-05-03 DIAGNOSIS — Z23 Encounter for immunization: Secondary | ICD-10-CM | POA: Diagnosis not present

## 2024-05-03 DIAGNOSIS — N2581 Secondary hyperparathyroidism of renal origin: Secondary | ICD-10-CM | POA: Diagnosis not present

## 2024-05-05 DIAGNOSIS — Z992 Dependence on renal dialysis: Secondary | ICD-10-CM | POA: Diagnosis not present

## 2024-05-05 DIAGNOSIS — N2581 Secondary hyperparathyroidism of renal origin: Secondary | ICD-10-CM | POA: Diagnosis not present

## 2024-05-05 DIAGNOSIS — N186 End stage renal disease: Secondary | ICD-10-CM | POA: Diagnosis not present

## 2024-05-05 DIAGNOSIS — Z23 Encounter for immunization: Secondary | ICD-10-CM | POA: Diagnosis not present

## 2024-05-05 DIAGNOSIS — R739 Hyperglycemia, unspecified: Secondary | ICD-10-CM | POA: Diagnosis not present

## 2024-05-06 ENCOUNTER — Other Ambulatory Visit: Payer: Self-pay

## 2024-05-06 ENCOUNTER — Ambulatory Visit (INDEPENDENT_AMBULATORY_CARE_PROVIDER_SITE_OTHER): Admitting: Primary Care

## 2024-05-07 DIAGNOSIS — Z23 Encounter for immunization: Secondary | ICD-10-CM | POA: Diagnosis not present

## 2024-05-07 DIAGNOSIS — Z992 Dependence on renal dialysis: Secondary | ICD-10-CM | POA: Diagnosis not present

## 2024-05-07 DIAGNOSIS — R739 Hyperglycemia, unspecified: Secondary | ICD-10-CM | POA: Diagnosis not present

## 2024-05-07 DIAGNOSIS — N2581 Secondary hyperparathyroidism of renal origin: Secondary | ICD-10-CM | POA: Diagnosis not present

## 2024-05-07 DIAGNOSIS — N186 End stage renal disease: Secondary | ICD-10-CM | POA: Diagnosis not present

## 2024-05-10 DIAGNOSIS — Z992 Dependence on renal dialysis: Secondary | ICD-10-CM | POA: Diagnosis not present

## 2024-05-10 DIAGNOSIS — Z23 Encounter for immunization: Secondary | ICD-10-CM | POA: Diagnosis not present

## 2024-05-10 DIAGNOSIS — N186 End stage renal disease: Secondary | ICD-10-CM | POA: Diagnosis not present

## 2024-05-10 DIAGNOSIS — N2581 Secondary hyperparathyroidism of renal origin: Secondary | ICD-10-CM | POA: Diagnosis not present

## 2024-05-10 DIAGNOSIS — R739 Hyperglycemia, unspecified: Secondary | ICD-10-CM | POA: Diagnosis not present

## 2024-05-13 ENCOUNTER — Ambulatory Visit (HOSPITAL_COMMUNITY)

## 2024-05-13 ENCOUNTER — Ambulatory Visit (HOSPITAL_COMMUNITY): Attending: Nephrology

## 2024-05-14 DIAGNOSIS — R739 Hyperglycemia, unspecified: Secondary | ICD-10-CM | POA: Diagnosis not present

## 2024-05-14 DIAGNOSIS — Z23 Encounter for immunization: Secondary | ICD-10-CM | POA: Diagnosis not present

## 2024-05-14 DIAGNOSIS — N2581 Secondary hyperparathyroidism of renal origin: Secondary | ICD-10-CM | POA: Diagnosis not present

## 2024-05-14 DIAGNOSIS — N186 End stage renal disease: Secondary | ICD-10-CM | POA: Diagnosis not present

## 2024-05-14 DIAGNOSIS — Z992 Dependence on renal dialysis: Secondary | ICD-10-CM | POA: Diagnosis not present

## 2024-05-17 DIAGNOSIS — R739 Hyperglycemia, unspecified: Secondary | ICD-10-CM | POA: Diagnosis not present

## 2024-05-17 DIAGNOSIS — Z23 Encounter for immunization: Secondary | ICD-10-CM | POA: Diagnosis not present

## 2024-05-17 DIAGNOSIS — Z992 Dependence on renal dialysis: Secondary | ICD-10-CM | POA: Diagnosis not present

## 2024-05-17 DIAGNOSIS — N2581 Secondary hyperparathyroidism of renal origin: Secondary | ICD-10-CM | POA: Diagnosis not present

## 2024-05-17 DIAGNOSIS — N186 End stage renal disease: Secondary | ICD-10-CM | POA: Diagnosis not present

## 2024-05-19 DIAGNOSIS — Z23 Encounter for immunization: Secondary | ICD-10-CM | POA: Diagnosis not present

## 2024-05-19 DIAGNOSIS — Z992 Dependence on renal dialysis: Secondary | ICD-10-CM | POA: Diagnosis not present

## 2024-05-19 DIAGNOSIS — N186 End stage renal disease: Secondary | ICD-10-CM | POA: Diagnosis not present

## 2024-05-19 DIAGNOSIS — N2581 Secondary hyperparathyroidism of renal origin: Secondary | ICD-10-CM | POA: Diagnosis not present

## 2024-05-19 DIAGNOSIS — R739 Hyperglycemia, unspecified: Secondary | ICD-10-CM | POA: Diagnosis not present

## 2024-05-21 DIAGNOSIS — N2581 Secondary hyperparathyroidism of renal origin: Secondary | ICD-10-CM | POA: Diagnosis not present

## 2024-05-21 DIAGNOSIS — Z23 Encounter for immunization: Secondary | ICD-10-CM | POA: Diagnosis not present

## 2024-05-21 DIAGNOSIS — Z992 Dependence on renal dialysis: Secondary | ICD-10-CM | POA: Diagnosis not present

## 2024-05-21 DIAGNOSIS — N186 End stage renal disease: Secondary | ICD-10-CM | POA: Diagnosis not present

## 2024-05-21 DIAGNOSIS — R739 Hyperglycemia, unspecified: Secondary | ICD-10-CM | POA: Diagnosis not present

## 2024-05-21 DIAGNOSIS — I129 Hypertensive chronic kidney disease with stage 1 through stage 4 chronic kidney disease, or unspecified chronic kidney disease: Secondary | ICD-10-CM | POA: Diagnosis not present

## 2024-05-24 DIAGNOSIS — N186 End stage renal disease: Secondary | ICD-10-CM | POA: Diagnosis not present

## 2024-05-24 DIAGNOSIS — N2581 Secondary hyperparathyroidism of renal origin: Secondary | ICD-10-CM | POA: Diagnosis not present

## 2024-05-24 DIAGNOSIS — Z992 Dependence on renal dialysis: Secondary | ICD-10-CM | POA: Diagnosis not present

## 2024-05-25 ENCOUNTER — Ambulatory Visit: Admitting: Pharmacist

## 2024-05-26 DIAGNOSIS — N186 End stage renal disease: Secondary | ICD-10-CM | POA: Diagnosis not present

## 2024-05-26 DIAGNOSIS — N2581 Secondary hyperparathyroidism of renal origin: Secondary | ICD-10-CM | POA: Diagnosis not present

## 2024-05-26 DIAGNOSIS — Z992 Dependence on renal dialysis: Secondary | ICD-10-CM | POA: Diagnosis not present

## 2024-05-28 DIAGNOSIS — N2581 Secondary hyperparathyroidism of renal origin: Secondary | ICD-10-CM | POA: Diagnosis not present

## 2024-05-28 DIAGNOSIS — N186 End stage renal disease: Secondary | ICD-10-CM | POA: Diagnosis not present

## 2024-05-28 DIAGNOSIS — Z992 Dependence on renal dialysis: Secondary | ICD-10-CM | POA: Diagnosis not present

## 2024-05-31 DIAGNOSIS — N2581 Secondary hyperparathyroidism of renal origin: Secondary | ICD-10-CM | POA: Diagnosis not present

## 2024-05-31 DIAGNOSIS — Z992 Dependence on renal dialysis: Secondary | ICD-10-CM | POA: Diagnosis not present

## 2024-05-31 DIAGNOSIS — N186 End stage renal disease: Secondary | ICD-10-CM | POA: Diagnosis not present

## 2024-06-03 ENCOUNTER — Other Ambulatory Visit: Payer: Self-pay

## 2024-06-03 ENCOUNTER — Emergency Department (HOSPITAL_COMMUNITY)

## 2024-06-03 ENCOUNTER — Emergency Department (HOSPITAL_COMMUNITY)
Admission: EM | Admit: 2024-06-03 | Discharge: 2024-06-03 | Disposition: A | Discharge status: Home / Self Care | Attending: Emergency Medicine | Admitting: Emergency Medicine

## 2024-06-03 DIAGNOSIS — E875 Hyperkalemia: Secondary | ICD-10-CM | POA: Insufficient documentation

## 2024-06-03 DIAGNOSIS — M545 Low back pain, unspecified: Secondary | ICD-10-CM | POA: Diagnosis not present

## 2024-06-03 DIAGNOSIS — Z992 Dependence on renal dialysis: Secondary | ICD-10-CM | POA: Insufficient documentation

## 2024-06-03 DIAGNOSIS — Z7982 Long term (current) use of aspirin: Secondary | ICD-10-CM | POA: Insufficient documentation

## 2024-06-03 DIAGNOSIS — Z79899 Other long term (current) drug therapy: Secondary | ICD-10-CM | POA: Diagnosis not present

## 2024-06-03 DIAGNOSIS — M4316 Spondylolisthesis, lumbar region: Secondary | ICD-10-CM | POA: Diagnosis not present

## 2024-06-03 DIAGNOSIS — M47816 Spondylosis without myelopathy or radiculopathy, lumbar region: Secondary | ICD-10-CM | POA: Diagnosis not present

## 2024-06-03 DIAGNOSIS — G8929 Other chronic pain: Secondary | ICD-10-CM | POA: Diagnosis not present

## 2024-06-03 DIAGNOSIS — N186 End stage renal disease: Secondary | ICD-10-CM | POA: Diagnosis not present

## 2024-06-03 DIAGNOSIS — I120 Hypertensive chronic kidney disease with stage 5 chronic kidney disease or end stage renal disease: Secondary | ICD-10-CM | POA: Insufficient documentation

## 2024-06-03 DIAGNOSIS — M5136 Other intervertebral disc degeneration, lumbar region with discogenic back pain only: Secondary | ICD-10-CM | POA: Diagnosis not present

## 2024-06-03 DIAGNOSIS — Z794 Long term (current) use of insulin: Secondary | ICD-10-CM | POA: Diagnosis not present

## 2024-06-03 DIAGNOSIS — I10 Essential (primary) hypertension: Secondary | ICD-10-CM | POA: Diagnosis not present

## 2024-06-03 LAB — CBC WITH DIFFERENTIAL/PLATELET
Abs Immature Granulocytes: 0.03 K/uL (ref 0.00–0.07)
Basophils Absolute: 0.1 K/uL (ref 0.0–0.1)
Basophils Relative: 1 %
Eosinophils Absolute: 0.3 K/uL (ref 0.0–0.5)
Eosinophils Relative: 4 %
HCT: 40.3 % (ref 39.0–52.0)
Hemoglobin: 12.7 g/dL — ABNORMAL LOW (ref 13.0–17.0)
Immature Granulocytes: 0 %
Lymphocytes Relative: 33 %
Lymphs Abs: 2.4 K/uL (ref 0.7–4.0)
MCH: 26.6 pg (ref 26.0–34.0)
MCHC: 31.5 g/dL (ref 30.0–36.0)
MCV: 84.5 fL (ref 80.0–100.0)
Monocytes Absolute: 0.9 K/uL (ref 0.1–1.0)
Monocytes Relative: 12 %
Neutro Abs: 3.6 K/uL (ref 1.7–7.7)
Neutrophils Relative %: 50 %
Platelets: 208 K/uL (ref 150–400)
RBC: 4.77 MIL/uL (ref 4.22–5.81)
RDW: 14.6 % (ref 11.5–15.5)
WBC: 7.2 K/uL (ref 4.0–10.5)
nRBC: 0 % (ref 0.0–0.2)

## 2024-06-03 LAB — BASIC METABOLIC PANEL WITH GFR
Anion gap: 19 — ABNORMAL HIGH (ref 5–15)
BUN: 77 mg/dL — ABNORMAL HIGH (ref 6–20)
CO2: 24 mmol/L (ref 22–32)
Calcium: 8.8 mg/dL — ABNORMAL LOW (ref 8.9–10.3)
Chloride: 100 mmol/L (ref 98–111)
Creatinine, Ser: 16.53 mg/dL — ABNORMAL HIGH (ref 0.61–1.24)
GFR, Estimated: 3 mL/min — ABNORMAL LOW (ref >60)
Glucose, Bld: 85 mg/dL (ref 70–99)
Potassium: 5.7 mmol/L — ABNORMAL HIGH (ref 3.5–5.1)
Sodium: 143 mmol/L (ref 135–145)

## 2024-06-03 MED ORDER — ACETAMINOPHEN 500 MG PO TABS
1000.0000 mg | ORAL_TABLET | Freq: Four times a day (QID) | ORAL | 0 refills | Status: AC | PRN
  Filled 2024-06-03: qty 30, 4d supply, fill #0

## 2024-06-03 MED ORDER — AMLODIPINE BESYLATE 5 MG PO TABS: 5.0000 mg | ORAL_TABLET | Freq: Every day | ORAL | Status: DC

## 2024-06-03 MED ORDER — LIDOCAINE 5 % EX PTCH
1.0000 | MEDICATED_PATCH | CUTANEOUS | 0 refills | Status: DC
  Filled 2024-06-03: qty 5, 5d supply, fill #0

## 2024-06-03 MED ORDER — SODIUM ZIRCONIUM CYCLOSILICATE 10 G PO PACK
10.0000 g | PACK | Freq: Once | ORAL | Status: AC
  Administered 2024-06-03: 10 g via ORAL
  Filled 2024-06-03: qty 1

## 2024-06-03 MED ORDER — ENALAPRIL MALEATE 2.5 MG PO TABS
5.0000 mg | ORAL_TABLET | Freq: Every day | ORAL | Status: DC
  Administered 2024-06-03: 5 mg via ORAL
  Filled 2024-06-03: qty 2

## 2024-06-03 MED ORDER — ACETAMINOPHEN 500 MG PO TABS
1000.0000 mg | ORAL_TABLET | Freq: Once | ORAL | Status: AC
  Administered 2024-06-03: 1000 mg via ORAL
  Filled 2024-06-03: qty 2

## 2024-06-03 MED ORDER — LIDOCAINE 5 % EX PTCH
1.0000 | MEDICATED_PATCH | CUTANEOUS | Status: DC
  Administered 2024-06-03: 1 via TRANSDERMAL
  Filled 2024-06-03: qty 1

## 2024-06-03 MED ORDER — OXYCODONE HCL 5 MG PO TABS
5.0000 mg | ORAL_TABLET | Freq: Once | ORAL | Status: AC
  Administered 2024-06-03: 5 mg via ORAL
  Filled 2024-06-03: qty 1

## 2024-06-03 MED ORDER — CYCLOBENZAPRINE HCL 10 MG PO TABS
5.0000 mg | ORAL_TABLET | Freq: Once | ORAL | Status: AC
  Administered 2024-06-03: 5 mg via ORAL
  Filled 2024-06-03: qty 1

## 2024-06-03 MED ORDER — CYCLOBENZAPRINE HCL 5 MG PO TABS
5.0000 mg | ORAL_TABLET | Freq: Three times a day (TID) | ORAL | 0 refills | Status: AC
  Filled 2024-06-03: qty 15, 5d supply, fill #0

## 2024-06-03 NOTE — ED Triage Notes (Signed)
 Pt arrived POV c/o sharp right sided back pain that stared Tuesday, rates pain 10/10.

## 2024-06-03 NOTE — ED Provider Notes (Signed)
 [place] EMERGENCY DEPARTMENT AT [facility] Provider Note   CSN: [id number] Arrival date & time: 06/03/24  9087     Patient presents with: Back Pain   Jeremy [name]. is a 58 y.o. male with PMH of HTN, ESRD on dialysis, with history of low back pain presenting to the ED for worsening left sided low back pain. Patient reports he was blowing leaves off his deck when he had a sharp pain in his right lower back. He reports feeling the pain radiate up his back when he moves or stretches. He denies any OTC or home meds prior to arrival. Of note, patient missed his outpatient dialysis yesterday due to pain. He denies fevers, midline tenderness, incontinence to bladder or stool, numbness in his lower extremities and weakness.      Back Pain      Prior to Admission medications   Medication Sig Start Date End Date Taking? Authorizing Provider  acetaminophen  (TYLENOL ) 500 MG tablet Take 2 tablets (1,000 mg total) by mouth every 6 (six) hours as needed. 06/03/24  Yes Sharlet Dowdy, MD  cyclobenzaprine  (FLEXERIL ) 5 MG tablet Take 1 tablet (5 mg total) by mouth 3 (three) times daily for 5 days. 06/03/24 06/08/24 Yes Sharlet Dowdy, MD  lidocaine  (LIDODERM ) 5 % Place 1 patch onto the skin daily. Remove & Discard patch within 12 hours or as directed by MD 06/03/24  Yes Sharlet Dowdy, MD  amLODipine  (NORVASC ) 2.5 MG tablet Take 2.5 mg by mouth at bedtime.    [provider]  aspirin  EC 81 MG tablet Take 1 tablet (81 mg total) by mouth daily. 03/04/24   Fleming, Zelda W, NP  atorvastatin  (LIPITOR) 40 MG tablet Take 1 tablet (40 mg total) by mouth daily. 04/14/24   Newlin, Enobong, MD  baclofen  (LIORESAL ) 10 MG tablet Take 1 tablet (10 mg total) by mouth at bedtime. 08/07/23     benzonatate  (TESSALON ) 100 MG capsule Take 1 capsule (100 mg total) by mouth 3 (three) times daily as needed for cough. Patient not taking: Reported on 04/29/2024 07/29/23   Vonna Sharlet POUR, MD  Blood Glucose  Monitoring Suppl (ONETOUCH VERIO FLEX SYSTEM) w/Device KIT Use as directed to check blood sugar 02/05/24   Delbert Clam, MD  Blood Pressure Monitoring (BLOOD PRESSURE DIGITAL SOLN) KIT Use to check blood pressure daily. 05/15/23   Fleming, Zelda W, NP  diclofenac  Sodium (VOLTAREN ) 1 % GEL Apply 4 g topically 4 (four) times daily. Patient not taking: Reported on 04/29/2024 08/20/22   Fleming, Zelda W, NP  enalapril  (VASOTEC ) 5 MG tablet Take 1 tablet (5 mg total) by mouth daily. 10/24/22   Newlin, Enobong, MD  gabapentin  (NEURONTIN ) 300 MG capsule Take 1 capsule (300 mg total) by mouth 2 (two) times daily. 04/29/24   Oley Bascom RAMAN, NP  glucose blood (ACCU-CHEK GUIDE TEST) test strip Use to check blood sugar 3 times daily. 02/05/24   Newlin, Enobong, MD  insulin  glargine (LANTUS  SOLOSTAR) 100 UNIT/ML Solostar Pen Inject 10 Units into the skin daily. Do not take the insulin  unless your fasting sugar in the morning is above 130 mg/dL. 04/14/24   Newlin, Enobong, MD  Insulin  Pen Needle (PEN NEEDLES) 32G X 4 MM MISC Use to inject Lantus  once daily. 02/07/24   Theotis Haze ORN, NP  Lancet Devices (MICROLET NEXT LANCING DEVICE) MISC 1 Device by Does not apply route 3 (three) times daily. 11/30/19   Fleming, Zelda W, NP  Lancets [facility] DELICA PLUS [place])  MISC Use as directed to check blood sugar three times daily 02/05/24   Newlin, Enobong, MD  levETIRAcetam  (KEPPRA  XR) 500 MG 24 hr tablet Take 2 tablets (1,000 mg total) by mouth daily. 12/24/23   Millikan, Megan, NP  pantoprazole  (PROTONIX ) 40 MG tablet Take 1 tablet (40 mg total) by mouth daily before breakfast. Patient not taking: Reported on 04/29/2024 08/18/23   Fleming, Zelda W, NP  SENSIPAR  30 MG tablet Take 60 mg by mouth 3 (three) times a week. 11/15/22   [provider]  Suzetrigine  (JOURNAVX ) 50 MG TABS Take 2 tablets by mouth 30 (thirty) minutes before breakfast for 1 day on empty stomach, THEN 1 tablet 2 (two) times daily for 29 days.  12/30/23     VELPHORO 500 MG chewable tablet Chew 500 mg by mouth 3 (three) times daily with meals. 02/07/20   [provider]  Vitamin D , Ergocalciferol , (DRISDOL ) 1.25 MG (50000 UNIT) CAPS capsule Take 1 capsule (50,000 Units total) by mouth once a week. Patient not taking: Reported on 04/29/2024 08/21/23   Theotis Haze ORN, NP    Allergies: Amlodipine     Review of Systems  Musculoskeletal:  Positive for back pain.    Updated Vital Signs BP (!) 160/74 (BP Location: Right Arm)   Pulse 72   Temp 98 F (36.7 C) (Oral)   Resp 18   SpO2 100%   Physical Exam Vitals and nursing note reviewed.  Constitutional:      General: He is not in acute distress.    Appearance: He is well-developed.  HENT:     Head: Normocephalic and atraumatic.  Eyes:     Conjunctiva/sclera: Conjunctivae normal.  Cardiovascular:     Rate and Rhythm: Normal rate and regular rhythm.     Heart sounds: No murmur heard. Pulmonary:     Effort: Pulmonary effort is normal. No respiratory distress.     Breath sounds: Normal breath sounds.  Abdominal:     Palpations: Abdomen is soft.     Tenderness: There is no abdominal tenderness.  Musculoskeletal:        General: Tenderness (Right lumbar paraspinal tenderness to palpation) present. No swelling.     Cervical back: Neck supple.  Skin:    General: Skin is warm and dry.     Capillary Refill: Capillary refill takes less than 2 seconds.  Neurological:     General: No focal deficit present.     Mental Status: He is alert and oriented to person, place, and time.     Sensory: No sensory deficit.     Motor: No weakness.     Gait: Gait normal.     (all labs ordered are listed, but only abnormal results are displayed) Labs Reviewed  CBC WITH DIFFERENTIAL/PLATELET - Abnormal; Notable for the following components:      Result Value   Hemoglobin 12.7 (*)    All other components within normal limits  BASIC METABOLIC PANEL WITH GFR - Abnormal; Notable for the  following components:   Potassium 5.7 (*)    BUN 77 (*)    Creatinine, Ser 16.53 (*)    Calcium  8.8 (*)    GFR, Estimated 3 (*)    Anion gap 19 (*)    All other components within normal limits    EKG: EKG Interpretation Date/Time:  Thursday June 03 2024 15:23:47 EST Ventricular Rate:  66 PR Interval:  196 QRS Duration:  161 QT Interval:  445 QTC Calculation: 467 R Axis:   -  5  Text Interpretation: Sinus rhythm Right bundle branch block Confirmed by Pamella Sharper [phone]) on 06/03/2024 3:50:38 PM  Radiology: [facility] Lumbar Spine 2-3 Views Result Date: 06/03/2024 CLINICAL DATA:  Low back pain. EXAM: LUMBAR SPINE - 2-3 VIEW COMPARISON:  MRI lumbar spine 09/27/2023. Lumbar spine radiographs 01/30/2022. FINDINGS: Five non-rib-bearing lumbar type vertebrae. Multilevel degenerative disc height loss at L3-L4, L4-L5 and L5-S1. Similar mild grade 1 anterolisthesis of L3 on L4. Facet arthropathy of the mid to lower lumbar spine. Sacroiliac joints are anatomically aligned. IMPRESSION: 1. No acute radiographic abnormality of the lumbar spine. 2. Multilevel degenerative disc height loss and facet arthropathy at L3-L4, L4-L5 and L5-S1. Similar mild grade 1 anterolisthesis of L3 on L4. Electronically Signed   By: Harrietta Sherry M.D.   On: 06/03/2024 14:18     Procedures   Medications Ordered in the ED  lidocaine  (LIDODERM ) 5 % 1 patch (1 patch Transdermal Patch Applied 06/03/24 1322)  enalapril  (VASOTEC ) tablet 5 mg (5 mg Oral Given 06/03/24 1448)  amLODipine  (NORVASC ) tablet 5 mg (has no administration in time range)  acetaminophen  (TYLENOL ) tablet 1,000 mg (1,000 mg Oral Given 06/03/24 1320)  cyclobenzaprine  (FLEXERIL ) tablet 5 mg (5 mg Oral Given 06/03/24 1321)  oxyCODONE  (Oxy IR/ROXICODONE ) immediate release tablet 5 mg (5 mg Oral Given 06/03/24 1448)  sodium zirconium cyclosilicate  (LOKELMA ) packet 10 g (10 g Oral Given 06/03/24 1615)    Clinical Course as of 06/03/24 2048  Thu Jun 03, 2024  1423 DG Lumbar Spine 2-3 Views Evidence of degenerative disc disease in the lumbar spine, no acute fractures or traumatic malalignment [LB]  1525 EKG 12-Lead Normal sinus rhythm with right bundle branch block, unchanged from patient's previous [LB]  1525 CBC with Differential(!) No significant leukocytosis, anemia stable [LB]    Clinical Course User Index [LB] Sharlet Dowdy, MD                                 Medical Decision Making Patient presents with acute on chronic low back pain after injuring himself while working in the yard. Exam is reassuring without point tenderness or neuro deficits.   Differential diagnosis includes but not limited to lumbar strain, lumbar radiculopathy, degenerative disc disease. Patient afebrile, normal strength and neuro exam, no midline tenderness, and no incontinence making cauda equina, epidural abscess or hematoma.  Because patient missed dialysis, EKG and labs were ordered. Patient given lidocaine  patch, flexeril  and PO pain control. Patient given home anti-hypertensives as he forgot to take them today.   L spine XR with multilevel degenerative disc disease, no acute fracture or malalignment  EKG with NSR, no evidence of acute ischemia, peaked T waves or prolonged QRS concerning for significant electrolyte abnormality. Labs without leukocytosis or anemia. Mildly elevated potassium at 5.7. Emergent dialysis is not indicated at this time with reassuring EKG and normal respiratory effort without signs of volume overload. Patient has dialysis scheduled tomorrow at 5 am. Patient given a does of Lokelma  to temporize hyperkalemia prior to dialysis.   On re-evaluation, patient endorsed improvement in his back pain. He is medically ready for discharge with dialysis in the AM and close outpatient PCP follow up for back pain. Patient agreed with this plan, strict return precautions were given and he was discharged home in stable condition.     Amount and/or  Complexity of Data Reviewed External Data Reviewed: labs. Labs: ordered. Decision-making details documented in ED  Course. Radiology: ordered and independent interpretation performed. Decision-making details documented in ED Course. ECG/medicine tests: ordered and independent interpretation performed. Decision-making details documented in ED Course.  Risk OTC drugs. Prescription drug management.       Final diagnoses:  Chronic right-sided low back pain without sciatica    ED Discharge Orders          Ordered    acetaminophen  (TYLENOL ) 500 MG tablet  Every 6 hours PRN        06/03/24 1555    cyclobenzaprine  (FLEXERIL ) 5 MG tablet  3 times daily        06/03/24 1555    lidocaine  (LIDODERM ) 5 %  Every 24 hours        06/03/24 1555               Sharlet Dowdy, MD 06/03/24 2048    Pamella Ozell LABOR, DO 06/04/24 [phone]

## 2024-06-03 NOTE — ED Notes (Signed)
 Pt given discharge instructions and verbalized understanding. Wheelchair used to wheel him out.

## 2024-06-03 NOTE — Discharge Instructions (Addendum)
 You were seen today for back pain. While you were here we monitored your vitals, performed a physical exam, labs and x-rays. These were all reassuring and there is no indication for any further testing or intervention in the emergency department at this time.   Things to do:  - Follow up with your primary care provider within the next 1-2 weeks - Take 1000 mg of Tylenol  every 6 hours as needed for pain - Take 5 mg of Flexeril  3 times a day as needed for muscle spasm - Rest for the next few days and ice your low back for 20 minutes at a time 4 times a day  Return to the emergency department if you have any new or worsening symptoms including weakness, numbness, difficulty walking, or if you have any other concerns.

## 2024-06-03 NOTE — ED Notes (Signed)
 Pt. Stated, the pain comes and goes mostly on the right side all the way up. Its a sharpe pain.

## 2024-06-04 DIAGNOSIS — N186 End stage renal disease: Secondary | ICD-10-CM | POA: Diagnosis not present

## 2024-06-04 DIAGNOSIS — N2581 Secondary hyperparathyroidism of renal origin: Secondary | ICD-10-CM | POA: Diagnosis not present

## 2024-06-04 DIAGNOSIS — Z992 Dependence on renal dialysis: Secondary | ICD-10-CM | POA: Diagnosis not present

## 2024-06-07 DIAGNOSIS — Z992 Dependence on renal dialysis: Secondary | ICD-10-CM | POA: Diagnosis not present

## 2024-06-07 DIAGNOSIS — N2581 Secondary hyperparathyroidism of renal origin: Secondary | ICD-10-CM | POA: Diagnosis not present

## 2024-06-07 DIAGNOSIS — N186 End stage renal disease: Secondary | ICD-10-CM | POA: Diagnosis not present

## 2024-06-11 DIAGNOSIS — N186 End stage renal disease: Secondary | ICD-10-CM | POA: Diagnosis not present

## 2024-06-11 DIAGNOSIS — Z992 Dependence on renal dialysis: Secondary | ICD-10-CM | POA: Diagnosis not present

## 2024-06-11 DIAGNOSIS — N2581 Secondary hyperparathyroidism of renal origin: Secondary | ICD-10-CM | POA: Diagnosis not present

## 2024-06-13 DIAGNOSIS — N2581 Secondary hyperparathyroidism of renal origin: Secondary | ICD-10-CM | POA: Diagnosis not present

## 2024-06-13 DIAGNOSIS — N186 End stage renal disease: Secondary | ICD-10-CM | POA: Diagnosis not present

## 2024-06-13 DIAGNOSIS — Z992 Dependence on renal dialysis: Secondary | ICD-10-CM | POA: Diagnosis not present

## 2024-06-15 DIAGNOSIS — Z992 Dependence on renal dialysis: Secondary | ICD-10-CM | POA: Diagnosis not present

## 2024-06-15 DIAGNOSIS — N186 End stage renal disease: Secondary | ICD-10-CM | POA: Diagnosis not present

## 2024-06-15 DIAGNOSIS — N2581 Secondary hyperparathyroidism of renal origin: Secondary | ICD-10-CM | POA: Diagnosis not present

## 2024-06-20 DIAGNOSIS — I129 Hypertensive chronic kidney disease with stage 1 through stage 4 chronic kidney disease, or unspecified chronic kidney disease: Secondary | ICD-10-CM | POA: Diagnosis not present

## 2024-06-20 DIAGNOSIS — N186 End stage renal disease: Secondary | ICD-10-CM | POA: Diagnosis not present

## 2024-06-20 DIAGNOSIS — Z992 Dependence on renal dialysis: Secondary | ICD-10-CM | POA: Diagnosis not present

## 2024-06-21 ENCOUNTER — Telehealth (INDEPENDENT_AMBULATORY_CARE_PROVIDER_SITE_OTHER): Payer: Self-pay | Admitting: Primary Care

## 2024-06-21 NOTE — Telephone Encounter (Signed)
 Called pt to reschedule appt bc pt scheduled for a day the provider will not be in office. Please advise and reschedule if pt call back.

## 2024-06-22 ENCOUNTER — Ambulatory Visit: Admitting: Podiatry

## 2024-06-23 ENCOUNTER — Ambulatory Visit (INDEPENDENT_AMBULATORY_CARE_PROVIDER_SITE_OTHER): Admitting: Primary Care

## 2024-06-28 ENCOUNTER — Telehealth (INDEPENDENT_AMBULATORY_CARE_PROVIDER_SITE_OTHER): Payer: Self-pay

## 2024-06-28 NOTE — Telephone Encounter (Unsigned)
 Copied from CRM #[id number]. Topic: Referral - Request for Referral >> Jun 28, 2024  4:07 PM Delon DASEN wrote: Did the patient discuss referral with their provider in the last year? Yes (If No - schedule appointment) (If Yes - send message)  Appointment offered? Yes  Type of order/referral and detailed reason for visit: orthopedic surgery  Preference of office, provider, location: [place]  If referral order, have you been seen by this specialty before? Yes (If Yes, this issue or another issue? When? Where? Dont remember  Can we respond through MyChart? No

## 2024-06-29 ENCOUNTER — Ambulatory Visit: Attending: Primary Care

## 2024-06-29 VITALS — Ht 68.0 in | Wt 260.0 lb

## 2024-06-29 DIAGNOSIS — Z0000 Encounter for general adult medical examination without abnormal findings: Secondary | ICD-10-CM

## 2024-06-29 DIAGNOSIS — Z59861 Financial insecurity, difficulty paying for utilities: Secondary | ICD-10-CM

## 2024-06-29 NOTE — Patient Instructions (Signed)
 Jeremy [name],  Thank you for taking the time for your Medicare Wellness Visit. I appreciate your continued commitment to your health goals. Please review the care plan we discussed, and feel free to reach out if I can assist you further.  Please note that Annual Wellness Visits do not include a physical exam. Some assessments may be limited, especially if the visit was conducted virtually. If needed, we may recommend an in-person follow-up with your provider.  Ongoing Care Seeing your primary care provider every 3 to 6 months helps us  monitor your health and provide consistent, personalized care.   Referrals If a referral was made during today's visit and you haven't received any updates within two weeks, please contact the referred provider directly to check on the status.  Recommended Screenings:  Health Maintenance  Topic Date Due   Hepatitis C Screening  Never done   COVID-19 Vaccine (3 - Moderna risk series) 12/03/2019   Eye exam for diabetics  05/02/2022   Zoster (Shingles) Vaccine (2 of 2) 10/15/2022   Complete foot exam   08/28/2023   Medicare Annual Wellness Visit  01/01/2024   Hemoglobin A1C  10/11/2024   DTaP/Tdap/Td vaccine (2 - Td or Tdap) 03/14/2026   Colon Cancer Screening  10/15/2033   Pneumococcal Vaccine for age over 72  Completed   Flu Shot  Completed   Hepatitis B Vaccine  Completed   HIV Screening  Completed   HPV Vaccine  Aged Out   Meningitis B Vaccine  Aged Out       06/29/2024    1:39 PM  Advanced Directives  Does Patient Have a Medical Advance Directive? No  Would patient like information on creating a medical advance directive? No - Patient declined    Vision: Annual vision screenings are recommended for early detection of glaucoma, cataracts, and diabetic retinopathy. These exams can also reveal signs of chronic conditions such as diabetes and high blood pressure.  Dental: Annual dental screenings help detect early signs of oral cancer, gum disease,  and other conditions linked to overall health, including heart disease and diabetes.  Please see the attached documents for additional preventive care recommendations.

## 2024-06-29 NOTE — Progress Notes (Signed)
 Chief Complaint  Patient presents with   Medicare Wellness    SUBSEQUENT     Subjective:   Jeremy [name]. is a 58 y.o. male who presents for a Medicare Annual Wellness Visit.  Visit info / Clinical Intake: Medicare Wellness Visit Type:: Subsequent Annual Wellness Visit Persons participating in visit and providing information:: patient Medicare Wellness Visit Mode:: Telephone If telephone:: video declined Since this visit was completed virtually, some vitals may be partially provided or unavailable. Missing vitals are due to the limitations of the virtual format.: Documented vitals are patient reported If Telephone or Video please confirm:: I connected with patient using audio/video enable telemedicine. I verified patient identity with two identifiers, discussed telehealth limitations, and patient agreed to proceed. Patient Location:: HOME Provider Location:: HOME OFFICE Interpreter Needed?: No Pre-visit prep was completed: yes AWV questionnaire completed by patient prior to visit?: no Living arrangements:: (!) lives alone Patient's Overall Health Status Rating: good Typical amount of pain: (!) a lot Does pain affect daily life?: no Are you currently prescribed opioids?: no  Dietary Habits and Nutritional Risks How many meals a day?: 2 (1-2 MEALS PER DAY) Eats fruit and vegetables daily?: yes Most meals are obtained by: preparing own meals In the last 2 weeks, have you had any of the following?: none Diabetic:: (!) yes Any non-healing wounds?: no How often do you check your BS?: 2 (CBG: 99 FASTING) Would you like to be referred to a Nutritionist or for Diabetic Management? : no  Functional Status Activities of Daily Living (to include ambulation/medication): Independent Ambulation: Independent with device- listed below Home Assistive Devices/Equipment: Cane (PRN) Medication Administration: Independent Home Management (perform basic housework or laundry):  Independent Manage your own finances?: (!) no Concerns about vision?: no *vision screening is required for WTM* Concerns about hearing?: (!) yes (HAS SOME IMPACTED WAX) Uses hearing aids?: no Hear whispered voice?: -- (OMIT)  Fall Screening Falls in the past year?: 0 Number of falls in past year: 0 Was there an injury with Fall?: 0 Fall Risk Category Calculator: 0 Patient Fall Risk Level: Low Fall Risk  Fall Risk Patient at Risk for Falls Due to: No Fall Risks Fall risk Follow up: Falls evaluation completed; Education provided  Home and Transportation Safety: All rugs have non-skid backing?: N/A, no rugs All stairs or steps have railings?: N/A, no stairs Grab bars in the bathtub or shower?: (!) no Have non-skid surface in bathtub or shower?: (!) no Good home lighting?: yes Regular seat belt use?: yes Hospital stays in the last year:: no  Cognitive Assessment Difficulty concentrating, remembering, or making decisions? : yes Will 6CIT or Mini Cog be Completed: yes What year is it?: 0 points What month is it?: 0 points Give patient an address phrase to remember (5 components): [name] [place], [place] About what time is it?: 0 points Count backwards from 20 to 1: 0 points Say the months of the year in reverse: 0 points Repeat the address phrase from earlier: 0 points 6 CIT Score: 0 points  Advance Directives (For Healthcare) Does Patient Have a Medical Advance Directive?: No Would patient like information on creating a medical advance directive?: No - Patient declined  Reviewed/Updated  Reviewed/Updated: Reviewed All (Medical, Surgical, Family, Medications, Allergies, Care Teams, Patient Goals)    Allergies (verified) Amlodipine    Current Medications (verified) Outpatient Encounter Medications as of 06/29/2024  Medication Sig   acetaminophen  (TYLENOL ) 500 MG tablet Take 2 tablets (1,000 mg total) by mouth  every 6 (six) hours as needed.   amLODipine  (NORVASC ) 2.5  MG tablet Take 2.5 mg by mouth at bedtime.   aspirin  EC 81 MG tablet Take 1 tablet (81 mg total) by mouth daily.   atorvastatin  (LIPITOR) 40 MG tablet Take 1 tablet (40 mg total) by mouth daily.   baclofen  (LIORESAL ) 10 MG tablet Take 1 tablet (10 mg total) by mouth at bedtime.   benzonatate  (TESSALON ) 100 MG capsule Take 1 capsule (100 mg total) by mouth 3 (three) times daily as needed for cough. (Patient not taking: Reported on 04/29/2024)   Blood Glucose Monitoring Suppl (ONETOUCH VERIO FLEX SYSTEM) w/Device KIT Use as directed to check blood sugar   Blood Pressure Monitoring (BLOOD PRESSURE DIGITAL SOLN) KIT Use to check blood pressure daily.   diclofenac  Sodium (VOLTAREN ) 1 % GEL Apply 4 g topically 4 (four) times daily. (Patient not taking: Reported on 04/29/2024)   enalapril  (VASOTEC ) 5 MG tablet Take 1 tablet (5 mg total) by mouth daily.   gabapentin  (NEURONTIN ) 300 MG capsule Take 1 capsule (300 mg total) by mouth 2 (two) times daily.   glucose blood (ACCU-CHEK GUIDE TEST) test strip Use to check blood sugar 3 times daily.   insulin  glargine (LANTUS  SOLOSTAR) 100 UNIT/ML Solostar Pen Inject 10 Units into the skin daily. Do not take the insulin  unless your fasting sugar in the morning is above 130 mg/dL.   Insulin  Pen Needle (PEN NEEDLES) 32G X 4 MM MISC Use to inject Lantus  once daily.   Lancet Devices (MICROLET NEXT LANCING DEVICE) MISC 1 Device by Does not apply route 3 (three) times daily.   Lancets (ONETOUCH DELICA PLUS LANCET33G) MISC Use as directed to check blood sugar three times daily   levETIRAcetam  (KEPPRA  XR) 500 MG 24 hr tablet Take 2 tablets (1,000 mg total) by mouth daily.   lidocaine  (LIDODERM ) 5 % Place 1 patch onto the skin daily. Remove & Discard patch within 12 hours or as directed by MD   pantoprazole  (PROTONIX ) 40 MG tablet Take 1 tablet (40 mg total) by mouth daily before breakfast. (Patient not taking: Reported on 04/29/2024)   SENSIPAR  30 MG tablet Take 60 mg by mouth  3 (three) times a week.   Suzetrigine  (JOURNAVX ) 50 MG TABS Take 2 tablets by mouth 30 (thirty) minutes before breakfast for 1 day on empty stomach, THEN 1 tablet 2 (two) times daily for 29 days.   VELPHORO 500 MG chewable tablet Chew 500 mg by mouth 3 (three) times daily with meals.   Vitamin D , Ergocalciferol , (DRISDOL ) 1.25 MG (50000 UNIT) CAPS capsule Take 1 capsule (50,000 Units total) by mouth once a week. (Patient not taking: Reported on 04/29/2024)   Facility-Administered Encounter Medications as of 06/29/2024  Medication   0.9 %  sodium chloride  infusion   sodium chloride  flush (NS) 0.9 % injection 3 mL    History: Past Medical History:  Diagnosis Date   Abscess    AKI (acute kidney injury) 08/05/2017   Bell's palsy    right eye abn since dx bell's palsy   CKD (chronic kidney disease)    Dr. McDiarmind    COVID 2020   mild   Dyspnea    occasional with exertion   ESRD (end stage renal disease) (HCC)    MWF -[facility]   Headache    migraines   High cholesterol    Hypertension    Left shoulder pain 10/26/2013   S/p injection on 10/26/13    Renal insufficiency  02/14/2014   Seizures (HCC) 08/04/2017   related to high blood sugars (08/05/2017)   Type II diabetes mellitus [facility])    Past Surgical History:  Procedure Laterality Date   A/V FISTULAGRAM Left 07/25/2022   Procedure: A/V Fistulagram;  Surgeon: Gretta Lonni PARAS, MD;  Location: [facility] INVASIVE CV LAB;  Service: Cardiovascular;  Laterality: Left;   AV FISTULA PLACEMENT Left 11/11/2019   Procedure: LEFT FOREARM RADIOCEPHALIC ARTERIOVENOUS (AV) FISTULA CREATION;  Surgeon: Gretta Lonni PARAS, MD;  Location: [facility] OR;  Service: Vascular;  Laterality: Left;   AV FISTULA PLACEMENT Left 08/14/2022   Procedure: FIRST STAGE BRACHIOBASILIC LEFT ARM ARTERIOVENOUS (AV) FISTULA CREATION;  Surgeon: Gretta Lonni PARAS, MD;  Location: [facility] OR;  Service: Vascular;  Laterality: Left;   BASCILIC VEIN TRANSPOSITION Left 10/30/2022    Procedure: LEFT ARM SECOND STAGE BASILIC VEIN TRANSPOSITION;  Surgeon: Gretta Lonni PARAS, MD;  Location: [name] OR;  Service: Vascular;  Laterality: Left;   COLONOSCOPY WITH PROPOFOL  N/A 10/16/2023   Procedure: COLONOSCOPY WITH PROPOFOL ;  Surgeon: Avram Lupita BRAVO, MD;  Location: [facility] ENDOSCOPY;  Service: Gastroenterology;  Laterality: N/A;   Eyelid surgery Right    I & D EXTREMITY  11/11/2011   Procedure: IRRIGATION AND DEBRIDEMENT EXTREMITY;  Surgeon: Deward GORMAN Curvin DOUGLAS, MD;  Location: [facility] OR;  Service: General;  Laterality: Right;  I&D Right Thigh Abscess   INSERTION OF DIALYSIS CATHETER N/A 07/25/2022   Procedure: INSERTION OF DIALYSIS CATHETER;  Surgeon: Gretta Lonni PARAS, MD;  Location: [facility] OR;  Service: Vascular;  Laterality: N/A;   IR FLUORO GUIDE CV LINE RIGHT  08/12/2019   IR US  GUIDE VASC ACCESS RIGHT  08/12/2019   LIGATION OF ARTERIOVENOUS  FISTULA Left 08/14/2022   Procedure: LIGATION OF BRACHIOCEPHALIC ARTERIOVENOUS  FISTULA;  Surgeon: Gretta Lonni PARAS, MD;  Location: [facility] OR;  Service: Vascular;  Laterality: Left;   REVISON OF ARTERIOVENOUS FISTULA Left 03/23/2020   Procedure: LEFT ARM ARTERIOVENOUS FISTULA REVISON, LIGATION OF SIDE BRANCHES AND ELEVATION;  Surgeon: Gretta Lonni PARAS, MD;  Location: [facility] OR;  Service: Vascular;  Laterality: Left;   Family History  Problem Relation Age of Onset   Diabetes Mother    Heart disease Mother 2   Diabetes Sister    Diabetes Brother    Diabetes Brother    Cancer Maternal Uncle        type unknown   Seizures Niece    Stroke Neg Hx    Social History   Occupational History   Occupation: maintenance tech   Occupation: Disablity  Tobacco Use   Smoking status: Never    Passive exposure: Never   Smokeless tobacco: Never  Vaping Use   Vaping status: Never Used  Substance and Sexual Activity   Alcohol use: Not Currently   Drug use: No   Sexual activity: Yes   Tobacco Counseling Counseling given: Not Answered  SDOH Screenings   Food  Insecurity: No Food Insecurity (06/29/2024)  Housing: Low Risk  (06/29/2024)  Transportation Needs: No Transportation Needs (06/29/2024)  Utilities: At Risk (06/29/2024)  Alcohol Screen: Low Risk  (06/29/2024)  Depression (PHQ2-9): Low Risk  (06/29/2024)  Financial Resource Strain: High Risk (06/29/2024)  Physical Activity: Inactive (06/29/2024)  Social Connections: Moderately Integrated (06/29/2024)  Stress: Stress Concern Present (06/29/2024)  Tobacco Use: Low Risk  (06/29/2024)  Health Literacy: Adequate Health Literacy (06/29/2024)   See flowsheets for full screening details  Depression Screen PHQ 2 & 9 Depression Scale- Over the past 2 weeks, how often have you  been bothered by any of the following problems? Little interest or pleasure in doing things: 0 Feeling down, depressed, or hopeless (PHQ Adolescent also includes...irritable): 3 (DEPRESSION) PHQ-2 Total Score: 3 Trouble falling or staying asleep, or sleeping too much: 1 (SLEEPS TERRIBLE ON DIALYSIS DAYS) Feeling tired or having little energy: 0 Poor appetite or overeating (PHQ Adolescent also includes...weight loss): 0 Feeling bad about yourself - or that you are a failure or have let yourself or your family down: 0 Trouble concentrating on things, such as reading the newspaper or watching television (PHQ Adolescent also includes...like school work): 0 Moving or speaking so slowly that other people could have noticed. Or the opposite - being so fidgety or restless that you have been moving around a lot more than usual: 0 Thoughts that you would be better off dead, or of hurting yourself in some way: 0 PHQ-9 Total Score: 4 If you checked off any problems, how difficult have these problems made it for you to do your work, take care of things at home, or get along with other people?: Not difficult at all  Depression Treatment Depression Interventions/Treatment : [id number] Score <4 Follow-up Not Indicated     Goals Addressed              This Visit's Progress    06/29/2024: Goals for 2026       To get on the kidney transplant list so that I can stop dialysis. Get closer to my kids. To get help with my utilities, due to my finances. To be stress free.             Objective:    Today's Vitals   06/29/24 1334  Weight: 260 lb (117.9 kg)  Height: 5' 8 (1.727 m)  PainSc: 10-Worst pain ever  PainLoc: Back   Body mass index is 39.53 kg/m.  Hearing/Vision screen Vision Screening - Comments:: Adequate vision with eyeglasses.  Yearly eye exam completed by [facility]. Immunizations and Health Maintenance Health Maintenance  Topic Date Due   Hepatitis C Screening  Never done   COVID-19 Vaccine (3 - Moderna risk series) 12/03/2019   OPHTHALMOLOGY EXAM  05/02/2022   Zoster Vaccines- Shingrix  (2 of 2) 10/15/2022   FOOT EXAM  08/28/2023   HEMOGLOBIN A1C  10/11/2024   Medicare Annual Wellness (AWV)  06/29/2025   DTaP/Tdap/Td (2 - Td or Tdap) 03/14/2026   Colonoscopy  10/15/2033   Pneumococcal Vaccine: 50+ Years  Completed   Influenza Vaccine  Completed   Hepatitis B Vaccines 19-59 Average Risk  Completed   HIV Screening  Completed   HPV VACCINES  Aged Out   Meningococcal B Vaccine  Aged Out        Assessment/Plan:  This is a routine wellness examination for Jeremy [name].  Patient Care Team: Theotis Haze ORN, NP as PCP - General (Nurse Practitioner) Zaldivar, Renzo A, MD as Consulting Physician [facility] Ophthalmology) James, [facility] Gaynel Delon CROME, DPM as Consulting Physician (Podiatry) Gretta Lonni PARAS, MD as Consulting Physician (Vascular Surgery) Eldonna Novel, MD as Consulting Physician (Physical Medicine and Rehabilitation) Sherryl Bouchard, NP as Registered Nurse (Neurology) Avram Lupita BRAVO, MD as Consulting Physician (Gastroenterology) Jolee Ozell KIDD, MD as Referring Physician (Physical Medicine and Rehabilitation) Nadyne Mabel BIRCH, MD as Referring  Physician (Ophthalmology)  I have personally reviewed and noted the following in the patient's chart:   Medical and social history Use of alcohol, tobacco or illicit drugs  Current medications and supplements including opioid prescriptions.  Functional ability and status Nutritional status Physical activity Advanced directives List of other physicians Hospitalizations, surgeries, and ER visits in previous 12 months Vitals Screenings to include cognitive, depression, and falls Referrals and appointments  No orders of the defined types were placed in this encounter.  In addition, I have reviewed and discussed with patient certain preventive protocols, quality metrics, and best practice recommendations. A written personalized care plan for preventive services as well as general preventive health recommendations were provided to patient.   Roz LOISE Fuller, LPN   [id number]   Return in 1 year (on 06/29/2025).  After Visit Summary: (Declined) Due to this being a telephonic visit, with patients personalized plan was offered to patient but patient Declined AVS at this time   Nurse Notes: Patient aware of current care gaps.  Patient is due for diabetic foot exam, diabetic eye exam (patient stated that he is up to date, but not able to remember the name of the ophthalmologist, covid-19, shingrix  and hep c screening.  Referral placed to [facility] for SDOH insecurities.

## 2024-07-01 ENCOUNTER — Telehealth: Payer: Self-pay

## 2024-07-01 NOTE — Telephone Encounter (Signed)
 At the request of Roz Fuller, LPN I called the patient to address his financial struggles.   He explained that he lives alone, rents his apartment and he is not working, he only receives disability.  He stated he could not discuss his income at this time. He said that his income is limited and he struggles with bills from Oct-Mar.   He said he is current with his rent and is water  was going to be cut off but he was able to gather money to pay that bill.  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  He said he owes  $565 or $575 to [employer].  He explained in the past he has been able to go to DSS and they have given him $600 to pay his bill; but now they don't have any funds.  I asked if he has contacted [facility] or [employer] and he said no, he knows what they will say.  I asked if he has contacted [employer] and he said no because he knows they will put him on a payment plan and he does not want that.  He said he has been on a payment plan in the past and and ended up paying more than what he owed. I asked him if they are dividing up the bill into 3-6 payment how is that more than paying $575 at once. He said he has done this before and he has to pay more. I told him that he really needs to call [employer] again and discuss the payment plan.  He said he really just wants to pay the balance off at once and I said that may not be an option.  He said he has no family or friends to ask for money.  I told him about the website: [email or web address] and he did not want any information about that.   I urged him to call [employer] and I told him if I find any other resource for him, I will contact him

## 2024-07-02 NOTE — Telephone Encounter (Signed)
 Please clarify who his PCP is. For some reason the LPN added me back as PCP when it was initially Rosaline Bohr

## 2024-07-02 NOTE — Telephone Encounter (Signed)
 NOTED

## 2024-07-02 NOTE — Telephone Encounter (Signed)
 Patient identified by name and date of birth.  Patient verified that he'd like to keep Ms. Theotis as his primary care provider. Though he did mention that he did say he wanted Ms. Edwards to be his primary, but only if he can't see Ms. Theotis when he wants to. I did explain the wording was a bit confusing and thats why I called to verify.

## 2024-07-07 ENCOUNTER — Ambulatory Visit: Admitting: Nurse Practitioner

## 2024-07-08 ENCOUNTER — Emergency Department (HOSPITAL_COMMUNITY)

## 2024-07-08 ENCOUNTER — Encounter (HOSPITAL_COMMUNITY): Payer: Self-pay

## 2024-07-08 ENCOUNTER — Emergency Department (HOSPITAL_COMMUNITY)
Admission: EM | Admit: 2024-07-08 | Discharge: 2024-07-08 | Disposition: A | Discharge status: Home / Self Care | Attending: Emergency Medicine | Admitting: Emergency Medicine

## 2024-07-08 ENCOUNTER — Other Ambulatory Visit: Payer: Self-pay

## 2024-07-08 DIAGNOSIS — Z7982 Long term (current) use of aspirin: Secondary | ICD-10-CM | POA: Insufficient documentation

## 2024-07-08 DIAGNOSIS — R079 Chest pain, unspecified: Secondary | ICD-10-CM | POA: Diagnosis not present

## 2024-07-08 DIAGNOSIS — G5603 Carpal tunnel syndrome, bilateral upper limbs: Secondary | ICD-10-CM | POA: Diagnosis not present

## 2024-07-08 DIAGNOSIS — Z79899 Other long term (current) drug therapy: Secondary | ICD-10-CM | POA: Diagnosis not present

## 2024-07-08 DIAGNOSIS — Z794 Long term (current) use of insulin: Secondary | ICD-10-CM | POA: Insufficient documentation

## 2024-07-08 DIAGNOSIS — N189 Chronic kidney disease, unspecified: Secondary | ICD-10-CM | POA: Insufficient documentation

## 2024-07-08 DIAGNOSIS — E1122 Type 2 diabetes mellitus with diabetic chronic kidney disease: Secondary | ICD-10-CM | POA: Insufficient documentation

## 2024-07-08 DIAGNOSIS — Z992 Dependence on renal dialysis: Secondary | ICD-10-CM | POA: Diagnosis not present

## 2024-07-08 DIAGNOSIS — G8929 Other chronic pain: Secondary | ICD-10-CM | POA: Diagnosis not present

## 2024-07-08 DIAGNOSIS — R519 Headache, unspecified: Secondary | ICD-10-CM | POA: Insufficient documentation

## 2024-07-08 DIAGNOSIS — M545 Low back pain, unspecified: Secondary | ICD-10-CM | POA: Insufficient documentation

## 2024-07-08 DIAGNOSIS — R0789 Other chest pain: Secondary | ICD-10-CM | POA: Insufficient documentation

## 2024-07-08 DIAGNOSIS — M65321 Trigger finger, right index finger: Secondary | ICD-10-CM | POA: Diagnosis not present

## 2024-07-08 LAB — BASIC METABOLIC PANEL WITH GFR
Anion gap: 14 (ref 5–15)
BUN: 46 mg/dL — ABNORMAL HIGH (ref 6–20)
CO2: 31 mmol/L (ref 22–32)
Calcium: 9.9 mg/dL (ref 8.9–10.3)
Chloride: 95 mmol/L — ABNORMAL LOW (ref 98–111)
Creatinine, Ser: 10.1 mg/dL — ABNORMAL HIGH (ref 0.61–1.24)
GFR, Estimated: 5 mL/min — ABNORMAL LOW (ref >60)
Glucose, Bld: 165 mg/dL — ABNORMAL HIGH (ref 70–99)
Potassium: 4.5 mmol/L (ref 3.5–5.1)
Sodium: 139 mmol/L (ref 135–145)

## 2024-07-08 LAB — CBC
HCT: 37.8 % — ABNORMAL LOW (ref 39.0–52.0)
Hemoglobin: 12.1 g/dL — ABNORMAL LOW (ref 13.0–17.0)
MCH: 26.7 pg (ref 26.0–34.0)
MCHC: 32 g/dL (ref 30.0–36.0)
MCV: 83.3 fL (ref 80.0–100.0)
Platelets: 214 K/uL (ref 150–400)
RBC: 4.54 MIL/uL (ref 4.22–5.81)
RDW: 14.6 % (ref 11.5–15.5)
WBC: 6.4 K/uL (ref 4.0–10.5)
nRBC: 0 % (ref 0.0–0.2)

## 2024-07-08 LAB — TROPONIN T, HIGH SENSITIVITY
Troponin T High Sensitivity: 120 ng/L (ref 0–19)
Troponin T High Sensitivity: 110 ng/L (ref 0–19)

## 2024-07-08 MED ORDER — PANTOPRAZOLE SODIUM 20 MG PO TBEC
20.0000 mg | DELAYED_RELEASE_TABLET | Freq: Every day | ORAL | 0 refills | Status: AC
  Filled 2024-07-08: qty 14, 14d supply, fill #0

## 2024-07-08 MED ORDER — PANTOPRAZOLE SODIUM 40 MG PO TBEC
40.0000 mg | DELAYED_RELEASE_TABLET | Freq: Once | ORAL | Status: DC
  Filled 2024-07-08: qty 1

## 2024-07-08 MED ORDER — IOHEXOL 350 MG/ML SOLN
100.0000 mL | Freq: Once | INTRAVENOUS | Status: AC | PRN
  Administered 2024-07-08: 13:00:00 100 mL via INTRAVENOUS

## 2024-07-08 NOTE — Discharge Instructions (Signed)
 Follow-up with cardiology.  Return if symptoms worsen.  Start taking Protonix  and follow-up with your primary care doctor.

## 2024-07-08 NOTE — ED Triage Notes (Signed)
 Intermittent left sided chest pain for a few weeks. Pt states pain is dull and he is not sleeping well because of it. C/o bilateral ear ringing for a few weeks.  Dialysis pt, MWF. Has not missed any dialysis this week.

## 2024-07-08 NOTE — ED Provider Notes (Addendum)
 [place] EMERGENCY DEPARTMENT AT [facility] Provider Note   CSN: [id number] Arrival date & time: 07/08/24  1029     Patient presents with: Chest Pain   Jeremy [name]. is a 58 y.o. male.   Patient here with some intermittent left-sided chest pain for a few weeks.  Nothing makes it worse or better although may be worse when he walks but he has some chronic low back pain with activity as well..  Pain is still not sleeping well because of it.  Maybe some ringing in his ears the last few weeks as well.  Mild headaches.  He is a dialysis patient.  He has not missed any dialysis sessions.  He denies any fever chills sputum production.  Does have history of diabetes high cholesterol CKD on dialysis.  Seizure history is as well but that was related to blood sugars.  He denies any abdominal pain nausea vomiting diarrhea. He is not having fevers or chills.  The history is provided by the patient.       Prior to Admission medications  Medication Sig Start Date End Date Taking? Authorizing Provider  pantoprazole  (PROTONIX ) 20 MG tablet Take 1 tablet (20 mg total) by mouth daily. 07/08/24 07/22/24 Yes Mack Thurmon, DO  acetaminophen  (TYLENOL ) 500 MG tablet Take 2 tablets (1,000 mg total) by mouth every 6 (six) hours as needed. 06/03/24   Sharlet Dowdy, MD  amLODipine  (NORVASC ) 2.5 MG tablet Take 2.5 mg by mouth at bedtime.    [provider]  aspirin  EC 81 MG tablet Take 1 tablet (81 mg total) by mouth daily. 03/04/24   Fleming, Zelda W, NP  atorvastatin  (LIPITOR) 40 MG tablet Take 1 tablet (40 mg total) by mouth daily. 04/14/24   Newlin, Enobong, MD  baclofen  (LIORESAL ) 10 MG tablet Take 1 tablet (10 mg total) by mouth at bedtime. 08/07/23     benzonatate  (TESSALON ) 100 MG capsule Take 1 capsule (100 mg total) by mouth 3 (three) times daily as needed for cough. Patient not taking: Reported on 04/29/2024 07/29/23   Vonna Sharlet POUR, MD  Blood Glucose Monitoring Suppl (ONETOUCH  VERIO FLEX SYSTEM) w/Device KIT Use as directed to check blood sugar 02/05/24   Delbert Clam, MD  Blood Pressure Monitoring (BLOOD PRESSURE DIGITAL SOLN) KIT Use to check blood pressure daily. 05/15/23   Fleming, Zelda W, NP  diclofenac  Sodium (VOLTAREN ) 1 % GEL Apply 4 g topically 4 (four) times daily. Patient not taking: Reported on 04/29/2024 08/20/22   Fleming, Zelda W, NP  enalapril  (VASOTEC ) 5 MG tablet Take 1 tablet (5 mg total) by mouth daily. 10/24/22   Newlin, Enobong, MD  gabapentin  (NEURONTIN ) 300 MG capsule Take 1 capsule (300 mg total) by mouth 2 (two) times daily. 04/29/24   Oley Bascom RAMAN, NP  glucose blood (ACCU-CHEK GUIDE TEST) test strip Use to check blood sugar 3 times daily. 02/05/24   Newlin, Enobong, MD  insulin  glargine (LANTUS  SOLOSTAR) 100 UNIT/ML Solostar Pen Inject 10 Units into the skin daily. Do not take the insulin  unless your fasting sugar in the morning is above 130 mg/dL. 04/14/24   Newlin, Enobong, MD  Insulin  Pen Needle (PEN NEEDLES) 32G X 4 MM MISC Use to inject Lantus  once daily. 02/07/24   Theotis Haze ORN, NP  Lancet Devices (MICROLET NEXT LANCING DEVICE) MISC 1 Device by Does not apply route 3 (three) times daily. 11/30/19   Fleming, Zelda W, NP  Lancets [facility] Novamed DELICA PLUS [place]) MISC Use as  directed to check blood sugar three times daily 02/05/24   Newlin, Enobong, MD  levETIRAcetam  (KEPPRA  XR) 500 MG 24 hr tablet Take 2 tablets (1,000 mg total) by mouth daily. 12/24/23   Sherryl Bouchard, NP  lidocaine  (LIDODERM ) 5 % Place 1 patch onto the skin daily. Remove & Discard patch within 12 hours or as directed by MD 06/03/24   Sharlet Dowdy, MD  SENSIPAR  30 MG tablet Take 60 mg by mouth 3 (three) times a week. 11/15/22   [provider]  Suzetrigine  (JOURNAVX ) 50 MG TABS Take 2 tablets by mouth 30 (thirty) minutes before breakfast for 1 day on empty stomach, THEN 1 tablet 2 (two) times daily for 29 days. 12/30/23     VELPHORO 500 MG chewable tablet Chew 500 mg  by mouth 3 (three) times daily with meals. 02/07/20   [provider]  Vitamin D , Ergocalciferol , (DRISDOL ) 1.25 MG (50000 UNIT) CAPS capsule Take 1 capsule (50,000 Units total) by mouth once a week. Patient not taking: Reported on 04/29/2024 08/21/23   Theotis Haze ORN, NP    Allergies: Amlodipine     Review of Systems  Updated Vital Signs BP (!) 160/78   Pulse 74   Temp 97.7 F (36.5 C) (Oral)   Resp 17   Ht 5' 8 (1.727 m)   Wt 114.3 kg   SpO2 100%   BMI 38.32 kg/m   Physical Exam Vitals and nursing note reviewed.  Constitutional:      General: He is not in acute distress.    Appearance: He is well-developed. He is not ill-appearing.  HENT:     Head: Normocephalic and atraumatic.     Comments: Tympanic membranes are normal.  There is no signs of earwax on exam    Right Ear: Tympanic membrane and ear canal normal. No swelling or tenderness. No middle ear effusion. There is no impacted cerumen.     Left Ear: Tympanic membrane and ear canal normal. No swelling or tenderness.  No middle ear effusion. There is no impacted cerumen.  Eyes:     Extraocular Movements: Extraocular movements intact.     Conjunctiva/sclera: Conjunctivae normal.     Pupils: Pupils are equal, round, and reactive to light.  Cardiovascular:     Rate and Rhythm: Normal rate and regular rhythm.     Pulses:          Radial pulses are 2+ on the right side and 2+ on the left side.     Heart sounds: Normal heart sounds. No murmur heard. Pulmonary:     Effort: Pulmonary effort is normal. No respiratory distress.     Breath sounds: Normal breath sounds. No decreased breath sounds or wheezing.  Abdominal:     Palpations: Abdomen is soft.     Tenderness: There is no abdominal tenderness.  Musculoskeletal:        General: No swelling.     Cervical back: Normal range of motion and neck supple.     Right lower leg: No edema.     Left lower leg: No edema.  Skin:    General: Skin is warm and dry.      Capillary Refill: Capillary refill takes less than 2 seconds.  Neurological:     General: No focal deficit present.     Mental Status: He is alert.     Comments: Normal strength and sensation throughout, normal speech no drift normal normal coordination  Psychiatric:        Mood and  Affect: Mood normal.     (all labs ordered are listed, but only abnormal results are displayed) Labs Reviewed  BASIC METABOLIC PANEL WITH GFR - Abnormal; Notable for the following components:      Result Value   Chloride 95 (*)    Glucose, Bld 165 (*)    BUN 46 (*)    Creatinine, Ser 10.10 (*)    GFR, Estimated 5 (*)    All other components within normal limits  CBC - Abnormal; Notable for the following components:   Hemoglobin 12.1 (*)    HCT 37.8 (*)    All other components within normal limits  TROPONIN T, HIGH SENSITIVITY - Abnormal; Notable for the following components:   Troponin T High Sensitivity 120 (*)    All other components within normal limits  TROPONIN T, HIGH SENSITIVITY - Abnormal; Notable for the following components:   Troponin T High Sensitivity 110 (*)    All other components within normal limits    EKG: EKG Interpretation Date/Time:  Thursday July 08 2024 10:38:07 EST Ventricular Rate:  83 PR Interval:  179 QRS Duration:  162 QT Interval:  424 QTC Calculation: 499 R Axis:   -15  Text Interpretation: Sinus rhythm Right bundle branch block Confirmed by Ruthe Cornet [phone]) on 07/08/2024 10:43:23 AM  Radiology: CT Angio Chest/Abd/Pel for Dissection W and/or Wo Contrast Result Date: 07/08/2024 CLINICAL DATA:  Acute aortic syndrome (AAS) suspected EXAM: CT ANGIOGRAPHY CHEST, ABDOMEN AND PELVIS TECHNIQUE: Non-contrast CT of the chest was initially obtained. Multidetector CT imaging through the chest, abdomen and pelvis was performed using the standard protocol during bolus administration of intravenous contrast. Multiplanar reconstructed images and MIPs were obtained and  reviewed to evaluate the vascular anatomy. RADIATION DOSE REDUCTION: This exam was performed according to the departmental dose-optimization program which includes automated exposure control, adjustment of the mA and/or kV according to patient size and/or use of iterative reconstruction technique. CONTRAST:  OMNIPAQUE  IOHEXOL  350 MG/ML SOLN COMPARISON:  07/08/2024 02/23/2021 FINDINGS: CTA CHEST FINDINGS Pulmonary Embolism: No pulmonary embolism. Cardiovascular: Moderate cardiomegaly. No pericardial effusion. Normal variant 2 vessel aortic arch anatomy with the left common carotid arising from the brachiocephalic artery. No aortic aneurysm, intramural hematoma, or aortic dissection. Calcified atherosclerosis throughout the thoracic aorta without hemodynamically significant stenosis. Dense multi-vessel coronary atherosclerosis. Mediastinum/Nodes: No mediastinal mass. No mediastinal, hilar, or axillary lymphadenopathy. Lungs/Pleura: The midline trachea and bronchi are patent. Fibrolinear scarring within the upper lobes and left lower lobe. No focal airspace consolidation, pleural effusion, or pneumothorax. Posterior bibasilar dependent atelectasis. Musculoskeletal: No acute fracture or destructive bone lesion. Moderate osteoarthritis of both glenohumeral joints. Review of the MIP images confirms the above findings. CTA ABDOMEN AND PELVIS FINDINGS VASCULAR Aorta: No aortic aneurysm or aortic dissection. Diffuse calcified atherosclerosis throughout the aorta. No hemodynamically significant stenosis. Celiac: Patent without acute thrombus, aneurysm, or dissection. No hemodynamically significant stenosis. SMA: Patent without acute thrombus, aneurysm, or dissection. No hemodynamically significant stenosis. Renals: Patent without acute thrombus, aneurysm, or dissection. Mild narrowing of the proximal right renal artery from focal calcified plaque. IMA: Patent without acute thrombus, aneurysm, or dissection. No  hemodynamically significant stenosis. Inflow: Patent without acute thrombus, aneurysm, or dissection. Calcified atherosclerosis throughout without hemodynamically significant stenosis. Proximal Outflow: The bilateral common femoral and visualized portions of the superficial and profunda femoral arteries are patent without acute thrombus, aneurysm, or dissection. No hemodynamically significant stenosis. Veins: Nondiagnostic evaluation of the veins due to arterial timing of the contrast bolus. Review of  the MIP images confirms the above findings. NON-VASCULAR Hepatobiliary: No mass.No radiopaque stones or wall thickening of the gallbladder. No intrahepatic or extrahepatic biliary ductal dilation. Pancreas: No mass or main ductal dilation.No peripancreatic inflammation or fluid collection. Spleen: Normal size. No mass. Adrenals/Urinary Tract: No adrenal masses. Small cyst in the lower pole of the left kidney. No hydronephrosis or nephrolithiasis. Mild circumferential wall thickening of the urinary bladder. Stomach/Bowel: The stomach contains ingested material without focal abnormality. No small bowel wall thickening or inflammation. No small bowel obstruction.Normal appendix. Lymphatic: No intraabdominal or pelvic lymphadenopathy. Reproductive: Mild prostatomegaly with findings of BPH. No free pelvic fluid. Other: No pneumoperitoneum, ascites, or mesenteric inflammation. Musculoskeletal: No acute fracture or destructive lesion.Multilevel degenerative disc disease of the thoracolumbar spine. Thoracic DISH. Moderate facet arthropathy of the lower lumbar spine. Small fat containing umbilical hernia. Review of the MIP images confirms the above findings. IMPRESSION: 1. No aortic aneurysm, intramural hematoma, or aortic dissection. 2. Mild circumferential wall thickening of the urinary bladder, which may be due to underdistention, acute cystitis, or developing changes of chronic bladder outlet obstruction due to mild  prostatomegaly and BPH. 3. Moderate cardiomegaly. No pneumonia, pulmonary edema, or pleural effusion. Aortic Atherosclerosis (ICD10-I70.0). Electronically Signed   By: Rogelia Myers M.D.   On: 07/08/2024 13:19   DG Chest 2 View Result Date: 07/08/2024 EXAM: 2 VIEW(S) XRAY OF THE CHEST 07/08/2024 11:16:00 AM COMPARISON: 03/12/2023 CLINICAL HISTORY: chest pain FINDINGS: LUNGS AND PLEURA: No focal pulmonary opacity. No pleural effusion. No pneumothorax. HEART AND MEDIASTINUM: No acute abnormality of the cardiac and mediastinal silhouettes. BONES AND SOFT TISSUES: Mild multilevel degenerative changes of thoracic spine. No acute osseous abnormality. IMPRESSION: 1. No acute findings. Electronically signed by: Morgane Naveau MD 07/08/2024 11:23 AM EST RP Workstation: [id number]   CT Head Wo Contrast Result Date: 07/08/2024 CLINICAL DATA:  Worsening headache. EXAM: CT HEAD WITHOUT CONTRAST TECHNIQUE: Contiguous axial images were obtained from the base of the skull through the vertex without intravenous contrast. RADIATION DOSE REDUCTION: This exam was performed according to the departmental dose-optimization program which includes automated exposure control, adjustment of the mA and/or kV according to patient size and/or use of iterative reconstruction technique. COMPARISON:  09/07/2021 FINDINGS: Brain: No evidence of acute infarction, hemorrhage, hydrocephalus, extra-axial collection or mass lesion/mass effect. Vascular: No hyperdense vessel or unexpected calcification. Skull: Normal. Negative for fracture or focal lesion. Sinuses/Orbits: No acute finding. Other: None. IMPRESSION: No acute intracranial pathology. Electronically Signed   By: Toribio Agreste M.D.   On: 07/08/2024 11:20     Procedures   Medications Ordered in the ED  pantoprazole  (PROTONIX ) EC tablet 40 mg (has no administration in time range)  iohexol  (OMNIPAQUE ) 350 MG/ML injection 100 mL (100 mLs Intravenous Contrast Given 07/08/24 1231)                 HEART Score: 4                    Medical Decision Making Amount and/or Complexity of Data Reviewed Labs: ordered. Radiology: ordered.  Risk Prescription drug management.   [name] is here with chest pain for couple weeks and also some ringing in the ears for the last few weeks.  He has dialysis history.  History of high blood pressure and diabetes and CKD on dialysis.  DDX acs, anemia, infection less likely. I have no concern for PE.  His Wells criteria 0 and doubt PE.  No recent surgery or travel.  No cancer history.  Will evaluate for ACS with troponin basic labs chest x-ray.  Have no concern for pneumonia but will check for anemia electrolyte abnormality.  EKG showed sinus rhythm.  Unchanged from prior.  Overall reassuring EKG.  He is also been complaining of some bilateral ear ringing.  He feels like he has earwax in his ears but his ear exams are normal.  There is no signs of earwax or ear infection on exam.  He is neurologically intact.  He has some mild headaches associated with this.  Ultimately will get a head CT to further evaluate for this and likely refer to ENT.  Patient is well-appearing.  He is not having any active symptoms at this time.  Per my review interpretation of labs initial troponin is 120.  Previous troponin high levels have been somewhat undetectable.  This is his first troponin T lab in our system.  On reevaluation he is having some abdominal discomfort now which she has been having at times as well that he did not mention to me earlier.  Ultimately will trend troponin but will just get a CT dissection study to further evaluate given his symptoms of chest pain and abdominal pain maybe exertional symptoms.  Patient is on dialysis.  This makes a minimal urine but I think risks and benefits here are outweighed and we should proceed with dissection study.  Overall dissection studies unremarkable.  No acute findings.  I have no concern for any  bladder issue.  Troponin was 120 and 110.  Creatinine baseline around 10.  No significant leukocytosis anemia or electrolyte abnormality otherwise.  Head CT was unremarkable.  EKG reassuring.  I talked with Dr. Jeffrie with cardiology and ultimately given flat troponin pain-free now likely troponin leak from chronic kidney disease.  This is the first time he had a troponin T in our system.  Ultimately we will put him on Protonix  and have him follow-up with cardiology outpatient.  Is not having any pain at this time.  Somewhat atypical story.  Will have him follow-up with cards and return if symptoms worsen.  We discussed these things.  He is comfortable with this plan.  This chart was dictated using voice recognition software.  Despite best efforts to proofread,  errors can occur which can change the documentation meaning.      Final diagnoses:  Nonspecific chest pain    ED Discharge Orders          Ordered    Ambulatory referral to Cardiology       Comments: If you have not heard from the Cardiology office within the next 72 hours please call [phone].   07/08/24 1416    pantoprazole  (PROTONIX ) 20 MG tablet  Daily        07/08/24 1419               Ruthe Cornet, DO 07/08/24 1419    Ruthe Cornet, DO 07/08/24 1419

## 2024-07-13 ENCOUNTER — Telehealth: Payer: Self-pay

## 2024-07-13 NOTE — Progress Notes (Signed)
 Complex Care Management Note  Care Guide Note 07/13/2024 Name: Jeremy [name]. MRN: [id number] DOB: [birth year]  Jeremy [name]. is a 58 y.o. year old male who sees Theotis Haze ORN, NP for primary care. I reached out to Jeremy [name]. by phone today to offer complex care management services.  Jeremy [name] was given information about Complex Care Management services today including:   The Complex Care Management services include support from the care team which includes your Nurse Care Manager, Clinical Social Worker, or Pharmacist.  The Complex Care Management team is here to help remove barriers to the health concerns and goals most important to you. Complex Care Management services are voluntary, and the patient may decline or stop services at any time by request to their care team member.   Complex Care Management Consent Status: Patient agreed to services and verbal consent obtained.   Follow up plan:  Telephone appointment with complex care management team member scheduled for:  08/16/24 @ 11 AM  Encounter Outcome:  Patient Scheduled  Leotis Rase [facility], [facility] Guide  Direct Dial : [phone]  Fax [phone]

## 2024-07-19 ENCOUNTER — Ambulatory Visit: Admitting: Podiatry

## 2024-07-19 ENCOUNTER — Encounter: Payer: Self-pay | Admitting: Podiatry

## 2024-07-19 DIAGNOSIS — B351 Tinea unguium: Secondary | ICD-10-CM | POA: Diagnosis not present

## 2024-07-19 DIAGNOSIS — N186 End stage renal disease: Secondary | ICD-10-CM

## 2024-07-19 DIAGNOSIS — M79675 Pain in left toe(s): Secondary | ICD-10-CM | POA: Diagnosis not present

## 2024-07-19 DIAGNOSIS — Z992 Dependence on renal dialysis: Secondary | ICD-10-CM

## 2024-07-19 DIAGNOSIS — M79674 Pain in right toe(s): Secondary | ICD-10-CM

## 2024-07-19 DIAGNOSIS — E0822 Diabetes mellitus due to underlying condition with diabetic chronic kidney disease: Secondary | ICD-10-CM

## 2024-07-19 DIAGNOSIS — Z794 Long term (current) use of insulin: Secondary | ICD-10-CM

## 2024-07-19 NOTE — Progress Notes (Signed)
"   °  °  Subjective:  Patient ID: Jeremy [name]., male    DOB: [birth year],  MRN: [id number]  Jeremy [name]. presents to clinic today for:  Chief Complaint  Patient presents with   Diabetes    [facility] IDDM A1C 6.4 Toenail trim.    Diabetic patient with end-stage renal disease (on dialysis) presents noting his nails are thick, discolored, elongated and painful in shoegear when trying to ambulate.  Notes that he is awaiting a kidney transplant.  PCP is Theotis Haze ORN, NP.  Last seen around 04/21/2024  Past Medical History:  Diagnosis Date   Abscess    AKI (acute kidney injury) 08/05/2017   Bell's palsy    right eye abn since dx bell's palsy   CKD (chronic kidney disease)    Dr. McDiarmind    COVID 2020   mild   Dyspnea    occasional with exertion   ESRD (end stage renal disease) (HCC)    MWF -[facility]   Headache    migraines   High cholesterol    Hypertension    Left shoulder pain 10/26/2013   S/p injection on 10/26/13    Renal insufficiency 02/14/2014   Seizures (HCC) 08/04/2017   related to high blood sugars (08/05/2017)   Type II diabetes mellitus (HCC)     Allergies  Allergen Reactions   Amlodipine  Other (See Comments)    Dizziness    Review of Systems: Negative except as noted in the HPI.  Objective:  Jeremy [name]. is a pleasant 58 y.o. male in NAD. AAO x 3.  Vascular Examination: Capillary refill time is 3-5 seconds to toes bilateral. Palpable pedal pulses b/l LE. Digital hair present b/l.  Skin temperature gradient WNL b/l. No varicosities b/l. No cyanosis noted b/l.   Dermatological Examination: Pedal skin with normal turgor, texture and tone b/l. No open wounds. No interdigital macerations b/l. Toenails x10 are 3mm thick, discolored, dystrophic with subungual debris. There is pain with compression of the nail plates.  They are elongated x10     Latest Ref Rng & Units 04/13/2024   10:21 AM 01/06/2024    8:44 AM 09/23/2023    8:52 AM  Hemoglobin A1C   Hemoglobin-A1c 0.0 - 7.0 % 6.4  6.0  5.7    Assessment/Plan: 1. Pain due to onychomycosis of toenails of both feet   2. Diabetes mellitus due to underlying condition with chronic kidney disease on chronic dialysis, with long-term current use of insulin  (HCC)     The mycotic toenails were sharply debrided x10 with sterile nail nippers and a power debriding burr to decrease bulk/thickness and length.    Return in about 3 months (around 10/17/2024) for [facility].   Awanda CHARM [place], DPM, FACFAS [facility]     [address] [place], [place] [address]                Office [phone]  Fax [phone] "

## 2024-07-23 ENCOUNTER — Other Ambulatory Visit: Payer: Self-pay

## 2024-07-23 ENCOUNTER — Ambulatory Visit: Payer: Self-pay

## 2024-07-23 DIAGNOSIS — J069 Acute upper respiratory infection, unspecified: Secondary | ICD-10-CM

## 2024-07-23 MED ORDER — ALBUTEROL SULFATE HFA 108 (90 BASE) MCG/ACT IN AERS
2.0000 | INHALATION_SPRAY | Freq: Four times a day (QID) | RESPIRATORY_TRACT | 0 refills | Status: AC | PRN
  Filled 2024-07-23: qty 6.7, 25d supply, fill #0

## 2024-07-23 NOTE — Telephone Encounter (Signed)
Patient identified by name and date of birth.  Patient aware of providers response and voiced understanding.  

## 2024-07-23 NOTE — Telephone Encounter (Signed)
Medication sent to pharmacy.  Patient aware.

## 2024-07-23 NOTE — Telephone Encounter (Signed)
 FYI Only or Action Required?: Action required by provider: clinical question for provider and update on patient condition.  Patient was last seen in primary care on 04/29/2024 by Oley Bascom RAMAN, NP.  Called Nurse Triage reporting Shortness of Breath.  Symptoms began about a month ago.  Interventions attempted: Rest, hydration, or home remedies.  Symptoms are: unchanged.  Triage Disposition: See HCP Within 4 Hours (Or PCP Triage)  Patient/caregiver understands and will follow disposition?: No, wishes to speak with PCP  Copied from CRM #[id number]. Topic: Clinical - Red Word Triage >> Jul 23, 2024  2:24 PM Emylou G wrote: [facility] that prompted transfer to Nurse Triage: shortness of breath.. lack of energy.. in need of inhaler.. was negative for covid Reason for Disposition  [1] MILD difficulty breathing (e.g., minimal/no SOB at rest, SOB with walking, pulse < 100) AND [2] NEW-onset or WORSE than normal  Answer Assessment - Initial Assessment Questions Patient is reports shortness of breath and no energy for a month. Patient was recommended to UC but patient became upset. Patient is asking for an inhaler to help with his shortness of breath. Patient is asking for a call back from the office.   1. RESPIRATORY STATUS: Describe your breathing? (e.g., wheezing, shortness of breath, unable to speak, severe coughing)      Shortness of breath 2. ONSET: When did this breathing problem begin?      Started a month ago 3. PATTERN Does the difficult breathing come and go, or has it been constant since it started?      Comes and goes 4. SEVERITY: How bad is your breathing? (e.g., mild, moderate, severe)      Mild 5. RECURRENT SYMPTOM: Have you had difficulty breathing before? If Yes, ask: When was the last time? and What happened that time?      Yes 6. CARDIAC HISTORY: Do you have any history of heart disease? (e.g., heart attack, angina, bypass surgery, angioplasty)      no 7.  LUNG HISTORY: Do you have any history of lung disease?  (e.g., pulmonary embolus, asthma, emphysema)     no 8. CAUSE: What do you think is causing the breathing problem?      unsure 9. OTHER SYMPTOMS: Do you have any other symptoms? (e.g., chest pain, cough, dizziness, fever, runny nose)     No energy 10. O2 SATURATION MONITOR:  Do you use an oxygen saturation monitor (pulse oximeter) at home? If Yes, ask: What is your reading (oxygen level) today? What is your usual oxygen saturation reading? (e.g., 95%)       NA 11. TRAVEL: Have you traveled out of the country in the last month? (e.g., travel history, exposures)       no  Protocols used: Breathing Difficulty-A-AH

## 2024-07-31 NOTE — Progress Notes (Unsigned)
 "  Card[facility]:    Date:  08/03/2024   ID:  Jeremy [name]., DOB March 12, 1966, MRN [id number]  PCP:  Theotis Haze ORN, NP  Cardiologist:  New - Dr. Kate (DOD today) Click to update primary MD,subspecialty MD or APP then REFRESH:1}    Referring MD: Ruthe Cornet, DO   Chief Complaint: chest pain  History of Present Illness:    Jeremy [name]. is a 59 y.o. male with a history of coronary artery calcifications noted on chest CTA in 06/2024, hypertension, hyperlipidemia, type 2 diabetes mellitus with peripheral neuropathy, ESRD on hemodialysis M/W/F, obstructive sleep apnea, secondary hyperparathyroidism, and seizure disorder who presents today as a new patient in the [facility] for evaluation of chest pain.   Prior Echo in 07/2017  during an admission for seizures and hypertensive urgency showed LVEF of 45-50% with diffuse hypokinesis. He was recently seen in the ED on 07/08/2024 for intermittent left sided chest pain. EKG showed normal sinus rhythm, rate 83 bpm, with RBBB but no acute ischemic changes compared to prior tracings. High-sensitivity troponin was mildly elevated and flat at 120 >> 110. CTA was negative for aortic aneurysm/ dissection but showed dense multivessel coronary atherosclerosis and moderate cardiomegaly. Troponin elevation was felt to be due to ESRD. He was started on Protonix  and felt to be stable for discharge with outpatient Cardiology referral.   Patient reports intermittent chest pain with activity for the past 8-12 months. He has difficulty describing the pain but does report it improves with rest. He denies any recurrent pain though since ED visit. He thinks the pain is due to stress. He also reports dyspnea on exertion with walking, but he thinks this is largely due to his chronic back/ hip pain. However, he also describes decreased energy level and is having to take more breaks when doing yard work. He denies any orthopnea or PND. He  reports some chronic lower extremity edema (mostly in his left leg) that is worse at the end of the day and improves with elevation of his leg. He reports occasional palpitations that he describes as a mild increase in heart rate that lasts for a couple of minutes. He also reports some lightheadedness/ dizziness with dialysis but no syncope. He describes labile BP and states BP will often be high in the morning but he has issues with hypotension during dialysis. He states they sometimes have to pause his dialysis to allow his BP to recover come. He states his Nephrologist wants to stops some of his antihypertensives but his PCP does not want to. He has a history of obstructive sleep apnea listed in his chart but states he has never had sleep study. He is not sure if he snores at night or not.   He denies any tobacco use, current alcohol use, or drug use.   EKGs/Labs/Other Studies Reviewed:    The following studies were reviewed:  Echocardiogram 08/05/2017: Study Conclusions: - Left ventricle: The cavity size was normal. There was mild focal    basal hypertrophy of the septum. Systolic function was mildly    reduced. The estimated ejection fraction was in the range of 45%    to 50%. Diffuse hypokinesis. Indeterminant diastolic function.  - Aortic valve: There was no stenosis.  - Mitral valve: There was no significant regurgitation.  - Right ventricle: The cavity size was normal. Systolic function    was normal.  - Pulmonary arteries: No complete TR doppler jet so unable to  estimate PA systolic pressure.  - Inferior vena cava: The vessel was normal in size. The    respirophasic diameter changes were in the normal range (>= 50%),    consistent with normal central venous pressure.   Impressions: - Technically difficult study with poor acoustic windows. Normal LV    size with mild focal basal septal hypertrophy. EF 45-50%, diffuse    mild hypokinesis. Normal RV size and systolic function.    EKG:  EKG not ordered today.   Recent Labs: 08/18/2023: TSH 0.773 09/23/2023: ALT 12 07/08/2024: BUN 46; Creatinine, Ser 10.10; Hemoglobin 12.1; Platelets 214; Potassium 4.5; Sodium 139  Recent Lipid Panel    Component Value Date/Time   CHOL 165 04/13/2024 1020   TRIG 87 04/13/2024 1020   HDL 46 04/13/2024 1020   CHOLHDL 3.6 04/13/2024 1020   CHOLHDL 3.1 02/09/2016 1045   VLDL 11 02/09/2016 1045   LDLCALC 103 (H) 04/13/2024 1020    Physical Exam:    Vital Signs: BP 124/78   Pulse 90   Ht 5' 8 (1.727 m)   Wt 258 lb 0.6 oz (117 kg)   SpO2 98%   BMI 39.23 kg/m     Wt Readings from Last 3 Encounters:  08/03/24 258 lb 0.6 oz (117 kg)  07/08/24 252 lb (114.3 kg)  06/29/24 260 lb (117.9 kg)     General: 59 y.o. obese African-American male in no acute distress. HEENT: Normocephalic and atraumatic. Sclera clear.  Neck: Supple. No carotid bruits. No JVD. Heart: RRR. Distinct S1 and S2. No murmurs, gallops, or rubs.  Lungs: No increased work of breathing. Clear to ausculation bilaterally. No wheezes, rhonchi, or rales.  Extremities: No lower extremity edema.   Skin: Warm and dry. Neuro: No focal deficits. Psych: Normal affect. Responds appropriately.  Assessment:    1. Chest pain of uncertain etiology   2. Coronary artery calcification   3. Primary hypertension   4. Hyperlipidemia, unspecified hyperlipidemia type   5. Type 2 diabetes mellitus with complication, with long-term current use of insulin  (HCC)   6. ESRD (end stage renal disease) on dialysis (HCC)   7. Obstructive sleep apnea   8. Essential hypertension     Plan:    Chest Pain Coronary Artery Calcifications Patient was recently seen in the ED in 06/2024 for intermittent left sided chest pain. EKG showed normal sinus rhythm with RBBB but no acute ischemic changes. High-sensitivity troponin was mildly elevated but flat at 120 >> 110 - this was felt to be due to ESRD. CTA was negative for aortic aneurysm/  dissection but showed dense multivessel coronary atherosclerosis.  - He reports intermittent chest pain with activity over the last 8-12 months; however, he denies any recurrent chest pain since ED visit.  - Continue aspirin  and statin. - Will order Echo.  - Initially was planning on ordering a coronary CTA. However, I pulled up recent CTA images and he has significant coronary artery calcifications. Therefore, cardiac PET stress test may be the better option. I will review with MD. We discussed the risk/ benefits of both and he okay with proceeding with either one.   Hypertension BP well controlled in the office. However, he describes labile BP at home. It sounds like BP is often high in the morning but he struggles with hypotension with dialysis.  - Current medications: Amlodipine  2.5mg  daily and Enalapril  5mg  daily. However, he states he hold medications on dialysis days.  - Will continue current medications for now. - Asked  patient to keep a BP/HR log for 2 weeks.  Hyperlipidemia Lipid panel in 03/2024: Total Cholesterol 165, Triglycerides 87, HDL 46, LDL 103. LDL goal <70. - Currently on Lipitor 40mg  daily. Recommended increasing this to 80mg  daily but patient would like to hold off on this and wait for results of ischemic evaluation.   Type 2 Diabetes Mellitus Hemoglobin A1c 6.0 in 12/2023.  - On Insulin .  - Management per PCP.   ESRD  On hemodialysis on M/W/F. - Management per Nephrology.   Obstructive Sleep Apnea Patient has a history of obstructive sleep apnea listed in his chart but states he has never had a sleep study.  - He is not sure if he snore and denies any significant daytime fatigue.  - STOP-BANG score = 5 suggesting he is high risk for obstructive sleep study.  - Recommended Itmar home sleep study but patient declined and states that would be a waste of time as he does not want to wear a CPAP machine.   Disposition: Follow up in 3 months (sooner depending on  results of ischemic evaluation). Will have him get established with Dr. Kate (DOD today).    Signed, Aline FORBES Door, PA-C  08/03/2024 10:52 PM     [facility] "

## 2024-08-03 ENCOUNTER — Encounter: Payer: Self-pay | Admitting: Student

## 2024-08-03 ENCOUNTER — Ambulatory Visit: Attending: Student | Admitting: Student

## 2024-08-03 ENCOUNTER — Other Ambulatory Visit: Payer: Self-pay

## 2024-08-03 VITALS — BP 124/78 | HR 90 | Ht 68.0 in | Wt 258.0 lb

## 2024-08-03 DIAGNOSIS — G4733 Obstructive sleep apnea (adult) (pediatric): Secondary | ICD-10-CM

## 2024-08-03 DIAGNOSIS — Z992 Dependence on renal dialysis: Secondary | ICD-10-CM

## 2024-08-03 DIAGNOSIS — I2510 Atherosclerotic heart disease of native coronary artery without angina pectoris: Secondary | ICD-10-CM | POA: Diagnosis not present

## 2024-08-03 DIAGNOSIS — R079 Chest pain, unspecified: Secondary | ICD-10-CM | POA: Diagnosis not present

## 2024-08-03 DIAGNOSIS — E118 Type 2 diabetes mellitus with unspecified complications: Secondary | ICD-10-CM

## 2024-08-03 DIAGNOSIS — N186 End stage renal disease: Secondary | ICD-10-CM | POA: Diagnosis not present

## 2024-08-03 DIAGNOSIS — Z794 Long term (current) use of insulin: Secondary | ICD-10-CM

## 2024-08-03 DIAGNOSIS — E785 Hyperlipidemia, unspecified: Secondary | ICD-10-CM | POA: Diagnosis not present

## 2024-08-03 DIAGNOSIS — I10 Essential (primary) hypertension: Secondary | ICD-10-CM | POA: Diagnosis not present

## 2024-08-03 MED ORDER — BLOOD PRESSURE DIGITAL SOLN KIT
1.0000 | PACK | Freq: Every day | 0 refills | Status: AC
  Filled 2024-08-03: qty 1, fill #0

## 2024-08-03 NOTE — Patient Instructions (Addendum)
 Medication Instructions:  Your physician recommends that you continue on your current medications as directed. Please refer to the Current Medication list given to you today.  *If you need a refill on your cardiac medications before your next appointment, please call your pharmacy*  Lab Work: None ordered  If you have labs (blood work) drawn today and your tests are completely normal, you will receive your results only by: MyChart Message (if you have MyChart) OR A paper copy in the mail If you have any lab test that is abnormal or we need to change your treatment, we will call you to review the results.  Testing/Procedures: Your physician has requested that you have an echocardiogram. Echocardiography is a painless test that uses sound waves to create images of your heart. It provides your doctor with information about the size and shape of your heart and how well your hearts chambers and valves are working. This procedure takes approximately one hour. There are no restrictions for this procedure. Please do NOT wear cologne, perfume, aftershave, or lotions (deodorant is allowed). Please arrive 15 minutes prior to your appointment time.  Please note: We ask at that you not bring children with you during ultrasound (echo/ vascular) testing. Due to room size and safety concerns, children are not allowed in the ultrasound rooms during exams. Our front office staff cannot provide observation of children in our lobby area while testing is being conducted. An adult accompanying a patient to their appointment will only be allowed in the ultrasound room at the discretion of the ultrasound technician under special circumstances. We apologize for any inconvenience.   Your physician has requested that you have a lexiscan myoview. For further information please visit [email or web address] Please follow instruction sheet, BELOW:     Follow-Up: At [facility], you and your health needs are our  priority.  As part of our continuing mission to provide you with exceptional heart care, our providers are all part of one team.  This team includes your primary Cardiologist (physician) and Advanced Practice Providers or APPs (Physician Assistants and Nurse Practitioners) who all work together to provide you with the care you need, when you need it.  Your next appointment:   3 MONTHS   Provider:   Lonni LITTIE Nanas, MD    We recommend signing up for the patient portal called MyChart.  Sign up information is provided on this After Visit Summary.  MyChart is used to connect with patients for Virtual Visits (Telemedicine).  Patients are able to view lab/test results, encounter notes, upcoming appointments, etc.  Non-urgent messages can be sent to your provider as well.   To learn more about what you can do with MyChart, go to [email or web address].   Other Instructions PICK UP THE BLOOD PRESSURE CUFF FROM YOUR PHARMACY....    Your physician has requested that you regularly monitor and record your blood pressure readings at home. Please use the same machine at the same time of day to check your readings and record them to bring to your follow-up visit.   Please monitor blood pressures and keep a log of your readings for 2 weeks and call them into the office to me.    Make sure to check 2 hours after your medications.    AVOID these things for 30 minutes before checking your blood pressure: No Drinking caffeine. No Drinking alcohol. No Eating. No Smoking. No Exercising.   Five minutes before checking your blood pressure: Pee. Sit in a dining chair. Avoid  sitting in a soft couch or armchair. Be quiet. Do not talk

## 2024-08-04 ENCOUNTER — Other Ambulatory Visit: Payer: Self-pay

## 2024-08-05 ENCOUNTER — Telehealth: Payer: Self-pay | Admitting: Student

## 2024-08-05 NOTE — Telephone Encounter (Signed)
 Walterine Hum,   I saw this gentleman earlier this week in the [facility] and we discussed getting a coronary CTA vs cardiac PET stress test. I wanted to review it with Dr. Kate because a recent chest CTA showed a lot of coronary aftery calcification. I talked with Dr. Kate and he said okay to proceed with coronary CTA.   Can you please notify patient of this and help get this ordered? He will also need a one time dose of Metoprolol  tartrate (Lopressor ) 100mg  to take 2 hours prior to CTA.   Thank you! Carlin Mamone

## 2024-08-06 ENCOUNTER — Telehealth: Payer: Self-pay

## 2024-08-06 ENCOUNTER — Other Ambulatory Visit: Payer: Self-pay

## 2024-08-06 DIAGNOSIS — I2510 Atherosclerotic heart disease of native coronary artery without angina pectoris: Secondary | ICD-10-CM

## 2024-08-06 DIAGNOSIS — R079 Chest pain, unspecified: Secondary | ICD-10-CM

## 2024-08-06 MED ORDER — METOPROLOL TARTRATE 100 MG PO TABS
ORAL_TABLET | ORAL | 0 refills | Status: DC
  Filled 2024-08-06: qty 1, 1d supply, fill #0

## 2024-08-06 NOTE — Telephone Encounter (Signed)
 Called patient. Left message for pt to call back to discuss. Ordered CTA and Metoprolol  needed fir procedure.

## 2024-08-06 NOTE — Telephone Encounter (Signed)
 Procedure ordered. Medication sent to patient pharmacy

## 2024-08-06 NOTE — Telephone Encounter (Signed)
 Left patient message for call back to discuss

## 2024-08-16 ENCOUNTER — Other Ambulatory Visit: Payer: Self-pay | Admitting: Licensed Clinical Social Worker

## 2024-08-16 NOTE — Patient Outreach (Signed)
 LCSW introduced self, explained role in CCM, and informed pt of referral. Patient reports that he is currently at another medical appt.   LCSW r/s appt for 08/17/24.

## 2024-08-17 ENCOUNTER — Telehealth: Payer: Self-pay | Admitting: Licensed Clinical Social Worker

## 2024-08-17 ENCOUNTER — Encounter: Payer: Self-pay | Admitting: Licensed Clinical Social Worker

## 2024-08-17 ENCOUNTER — Telehealth: Admitting: Licensed Clinical Social Worker

## 2024-08-19 ENCOUNTER — Other Ambulatory Visit: Payer: Self-pay

## 2024-08-19 NOTE — Patient Instructions (Signed)
 Jeremy [name]. - I am sorry I was unable to reach you today for our scheduled appointment. I work with Theotis Haze ORN, NP and am calling to support your healthcare needs. Please contact me at [phone] at your earliest convenience. I look forward to speaking with you soon.   Thank you,  Rolin Kerns, LCSW [facility]  [facility], [facility] Clinical Social Worker Direct Dial : [phone]  Fax: [phone] Website: [email or web address] 4:16 PM

## 2024-08-23 ENCOUNTER — Other Ambulatory Visit (HOSPITAL_COMMUNITY): Payer: Self-pay | Admitting: *Deleted

## 2024-08-23 ENCOUNTER — Other Ambulatory Visit: Payer: Self-pay

## 2024-08-23 MED ORDER — METOPROLOL TARTRATE 100 MG PO TABS
ORAL_TABLET | ORAL | 0 refills | Status: AC
  Filled 2024-08-23: qty 1, 1d supply, fill #0

## 2024-08-24 ENCOUNTER — Ambulatory Visit (HOSPITAL_COMMUNITY)
Admission: RE | Admit: 2024-08-24 | Discharge: 2024-08-24 | Disposition: A | Source: Ambulatory Visit | Attending: Student | Admitting: Student

## 2024-08-24 DIAGNOSIS — I2510 Atherosclerotic heart disease of native coronary artery without angina pectoris: Secondary | ICD-10-CM

## 2024-08-24 DIAGNOSIS — R079 Chest pain, unspecified: Secondary | ICD-10-CM

## 2024-08-24 MED ORDER — NITROGLYCERIN 0.4 MG SL SUBL
0.8000 mg | SUBLINGUAL_TABLET | Freq: Once | SUBLINGUAL | Status: AC
  Administered 2024-08-24: 0.8 mg via SUBLINGUAL

## 2024-08-24 MED ORDER — IOHEXOL 350 MG/ML SOLN
100.0000 mL | Freq: Once | INTRAVENOUS | Status: AC | PRN
  Administered 2024-08-24: 100 mL via INTRAVENOUS

## 2024-08-25 ENCOUNTER — Ambulatory Visit: Payer: Self-pay | Admitting: Student

## 2024-08-25 NOTE — Progress Notes (Signed)
 Called pt, lm for cb regarding ct

## 2024-09-02 ENCOUNTER — Ambulatory Visit (HOSPITAL_COMMUNITY)

## 2024-09-17 ENCOUNTER — Telehealth: Admitting: Licensed Clinical Social Worker

## 2024-10-21 ENCOUNTER — Ambulatory Visit: Admitting: Podiatry

## 2024-11-09 ENCOUNTER — Ambulatory Visit: Admitting: Cardiology
